# Patient Record
Sex: Female | Born: 1945
Health system: Southern US, Community
[De-identification: ages and names within clinical notes are randomized; demographics above are authoritative.]

## PROBLEM LIST (undated history)

## (undated) DIAGNOSIS — I25811 Atherosclerosis of native coronary artery of transplanted heart without angina pectoris: Secondary | ICD-10-CM

## (undated) DIAGNOSIS — F329 Major depressive disorder, single episode, unspecified: Secondary | ICD-10-CM

## (undated) DIAGNOSIS — S72141A Displaced intertrochanteric fracture of right femur, initial encounter for closed fracture: Secondary | ICD-10-CM

## (undated) DIAGNOSIS — K626 Ulcer of anus and rectum: Secondary | ICD-10-CM

## (undated) DIAGNOSIS — E119 Type 2 diabetes mellitus without complications: Secondary | ICD-10-CM

## (undated) DIAGNOSIS — E46 Unspecified protein-calorie malnutrition: Secondary | ICD-10-CM

## (undated) DIAGNOSIS — Z8719 Personal history of other diseases of the digestive system: Secondary | ICD-10-CM

## (undated) DIAGNOSIS — I639 Cerebral infarction, unspecified: Secondary | ICD-10-CM

## (undated) DIAGNOSIS — I10 Essential (primary) hypertension: Secondary | ICD-10-CM

## (undated) DIAGNOSIS — E785 Hyperlipidemia, unspecified: Secondary | ICD-10-CM

## (undated) DIAGNOSIS — D169 Benign neoplasm of bone and articular cartilage, unspecified: Secondary | ICD-10-CM

## (undated) DIAGNOSIS — Q211 Atrial septal defect: Secondary | ICD-10-CM

## (undated) DIAGNOSIS — F32A Depression, unspecified: Secondary | ICD-10-CM

## (undated) DIAGNOSIS — I509 Heart failure, unspecified: Secondary | ICD-10-CM

## (undated) DIAGNOSIS — D62 Acute posthemorrhagic anemia: Secondary | ICD-10-CM

## (undated) HISTORY — DX: Hyperlipidemia, unspecified: E78.5

## (undated) HISTORY — DX: Personal history of other diseases of the digestive system: Z87.19

## (undated) HISTORY — DX: Depression, unspecified: F32.A

## (undated) HISTORY — DX: Acute posthemorrhagic anemia: D62

## (undated) HISTORY — DX: Unspecified protein-calorie malnutrition: E46

## (undated) HISTORY — DX: Displaced intertrochanteric fracture of right femur, initial encounter for closed fracture: S72.141A

## (undated) HISTORY — DX: Atherosclerosis of native coronary artery of transplanted heart without angina pectoris: I25.811

## (undated) HISTORY — DX: Major depressive disorder, single episode, unspecified: F32.9

## (undated) HISTORY — PX: CARDIAC SURGERY: SHX584

---

## 1999-10-02 ENCOUNTER — Ambulatory Visit (HOSPITAL_BASED_OUTPATIENT_CLINIC_OR_DEPARTMENT_OTHER): Admission: RE | Admit: 1999-10-02 | Discharge: 1999-10-02 | Payer: Self-pay | Admitting: Otolaryngology

## 2002-02-26 ENCOUNTER — Emergency Department (HOSPITAL_COMMUNITY): Admission: EM | Admit: 2002-02-26 | Discharge: 2002-02-26 | Payer: Self-pay | Admitting: Emergency Medicine

## 2002-02-26 ENCOUNTER — Encounter: Payer: Self-pay | Admitting: Emergency Medicine

## 2002-08-14 ENCOUNTER — Emergency Department (HOSPITAL_COMMUNITY): Admission: EM | Admit: 2002-08-14 | Discharge: 2002-08-14 | Payer: Self-pay | Admitting: Emergency Medicine

## 2002-08-14 ENCOUNTER — Encounter: Payer: Self-pay | Admitting: Emergency Medicine

## 2002-08-21 ENCOUNTER — Encounter: Payer: Self-pay | Admitting: Cardiology

## 2002-08-21 ENCOUNTER — Encounter: Admission: RE | Admit: 2002-08-21 | Discharge: 2002-08-21 | Payer: Self-pay | Admitting: Cardiology

## 2002-09-07 ENCOUNTER — Inpatient Hospital Stay (HOSPITAL_COMMUNITY): Admission: EM | Admit: 2002-09-07 | Discharge: 2002-09-10 | Payer: Self-pay | Admitting: Emergency Medicine

## 2002-09-08 ENCOUNTER — Encounter: Payer: Self-pay | Admitting: Cardiology

## 2002-09-17 ENCOUNTER — Encounter: Payer: Self-pay | Admitting: Cardiology

## 2002-09-17 ENCOUNTER — Ambulatory Visit (HOSPITAL_COMMUNITY): Admission: RE | Admit: 2002-09-17 | Discharge: 2002-09-17 | Payer: Self-pay | Admitting: Cardiology

## 2002-10-11 ENCOUNTER — Emergency Department (HOSPITAL_COMMUNITY): Admission: AD | Admit: 2002-10-11 | Discharge: 2002-10-12 | Payer: Self-pay | Admitting: Emergency Medicine

## 2002-10-11 ENCOUNTER — Encounter: Payer: Self-pay | Admitting: Emergency Medicine

## 2003-01-26 ENCOUNTER — Encounter: Payer: Self-pay | Admitting: Emergency Medicine

## 2003-01-26 ENCOUNTER — Inpatient Hospital Stay (HOSPITAL_COMMUNITY): Admission: EM | Admit: 2003-01-26 | Discharge: 2003-01-29 | Payer: Self-pay | Admitting: Emergency Medicine

## 2003-01-28 ENCOUNTER — Encounter: Payer: Self-pay | Admitting: Cardiology

## 2003-01-31 ENCOUNTER — Emergency Department (HOSPITAL_COMMUNITY): Admission: EM | Admit: 2003-01-31 | Discharge: 2003-01-31 | Payer: Self-pay | Admitting: *Deleted

## 2003-09-30 ENCOUNTER — Emergency Department (HOSPITAL_COMMUNITY): Admission: EM | Admit: 2003-09-30 | Discharge: 2003-09-30 | Payer: Self-pay | Admitting: Family Medicine

## 2003-12-30 ENCOUNTER — Inpatient Hospital Stay (HOSPITAL_COMMUNITY): Admission: EM | Admit: 2003-12-30 | Discharge: 2004-01-02 | Payer: Self-pay | Admitting: Emergency Medicine

## 2004-04-01 ENCOUNTER — Emergency Department (HOSPITAL_COMMUNITY): Admission: EM | Admit: 2004-04-01 | Discharge: 2004-04-01 | Payer: Self-pay | Admitting: Emergency Medicine

## 2004-06-14 ENCOUNTER — Inpatient Hospital Stay (HOSPITAL_COMMUNITY): Admission: EM | Admit: 2004-06-14 | Discharge: 2004-06-16 | Payer: Self-pay | Admitting: Emergency Medicine

## 2004-12-18 ENCOUNTER — Emergency Department (HOSPITAL_COMMUNITY): Admission: EM | Admit: 2004-12-18 | Discharge: 2004-12-18 | Payer: Self-pay | Admitting: Emergency Medicine

## 2005-01-01 ENCOUNTER — Inpatient Hospital Stay (HOSPITAL_BASED_OUTPATIENT_CLINIC_OR_DEPARTMENT_OTHER): Admission: RE | Admit: 2005-01-01 | Discharge: 2005-01-01 | Payer: Self-pay | Admitting: Cardiology

## 2005-03-11 ENCOUNTER — Encounter: Admission: RE | Admit: 2005-03-11 | Discharge: 2005-03-11 | Payer: Self-pay | Admitting: Cardiology

## 2005-11-15 ENCOUNTER — Ambulatory Visit (HOSPITAL_COMMUNITY): Admission: RE | Admit: 2005-11-15 | Discharge: 2005-11-15 | Payer: Self-pay | Admitting: Cardiology

## 2006-02-06 ENCOUNTER — Inpatient Hospital Stay (HOSPITAL_COMMUNITY): Admission: EM | Admit: 2006-02-06 | Discharge: 2006-02-08 | Payer: Self-pay | Admitting: Emergency Medicine

## 2006-10-16 ENCOUNTER — Inpatient Hospital Stay (HOSPITAL_COMMUNITY): Admission: EM | Admit: 2006-10-16 | Discharge: 2006-10-19 | Payer: Self-pay | Admitting: Emergency Medicine

## 2007-05-27 ENCOUNTER — Emergency Department (HOSPITAL_COMMUNITY): Admission: EM | Admit: 2007-05-27 | Discharge: 2007-05-27 | Payer: Self-pay | Admitting: Emergency Medicine

## 2007-08-22 ENCOUNTER — Emergency Department (HOSPITAL_COMMUNITY): Admission: EM | Admit: 2007-08-22 | Discharge: 2007-08-22 | Payer: Self-pay | Admitting: Emergency Medicine

## 2007-12-30 ENCOUNTER — Emergency Department (HOSPITAL_COMMUNITY): Admission: EM | Admit: 2007-12-30 | Discharge: 2007-12-31 | Payer: Self-pay | Admitting: Emergency Medicine

## 2008-08-16 ENCOUNTER — Ambulatory Visit (HOSPITAL_COMMUNITY): Admission: RE | Admit: 2008-08-16 | Discharge: 2008-08-16 | Payer: Self-pay | Admitting: Orthopedic Surgery

## 2008-10-08 ENCOUNTER — Emergency Department (HOSPITAL_COMMUNITY): Admission: EM | Admit: 2008-10-08 | Discharge: 2008-10-08 | Payer: Self-pay | Admitting: Emergency Medicine

## 2009-07-05 DIAGNOSIS — D169 Benign neoplasm of bone and articular cartilage, unspecified: Secondary | ICD-10-CM

## 2009-07-05 HISTORY — DX: Benign neoplasm of bone and articular cartilage, unspecified: D16.9

## 2009-08-19 ENCOUNTER — Emergency Department (HOSPITAL_COMMUNITY): Admission: EM | Admit: 2009-08-19 | Discharge: 2009-08-19 | Payer: Self-pay | Admitting: Emergency Medicine

## 2009-10-10 ENCOUNTER — Emergency Department (HOSPITAL_COMMUNITY): Admission: EM | Admit: 2009-10-10 | Discharge: 2009-10-10 | Payer: Self-pay | Admitting: Emergency Medicine

## 2010-02-14 ENCOUNTER — Observation Stay (HOSPITAL_COMMUNITY): Admission: EM | Admit: 2010-02-14 | Discharge: 2010-02-18 | Payer: Self-pay | Admitting: Emergency Medicine

## 2010-02-16 ENCOUNTER — Encounter (INDEPENDENT_AMBULATORY_CARE_PROVIDER_SITE_OTHER): Payer: Self-pay | Admitting: Cardiology

## 2010-05-15 ENCOUNTER — Emergency Department (HOSPITAL_COMMUNITY): Admission: EM | Admit: 2010-05-15 | Discharge: 2010-05-15 | Payer: Self-pay | Admitting: Emergency Medicine

## 2010-09-16 LAB — POCT CARDIAC MARKERS
CKMB, poc: 1.8 ng/mL (ref 1.0–8.0)
CKMB, poc: 2 ng/mL (ref 1.0–8.0)
Myoglobin, poc: 95.2 ng/mL (ref 12–200)
Troponin i, poc: <0.05 ng/mL (ref 0.00–0.09)
Troponin i, poc: <0.05 ng/mL (ref 0.00–0.09)

## 2010-09-16 LAB — CBC
HCT: 42.6 % (ref 36.0–46.0)
MCH: 28.3 pg (ref 26.0–34.0)
MCV: 88.8 fL (ref 78.0–100.0)
Platelets: 213 10*3/uL (ref 150–400)
RBC: 4.8 MIL/uL (ref 3.87–5.11)
WBC: 8.7 10*3/uL (ref 4.0–10.5)

## 2010-09-16 LAB — BASIC METABOLIC PANEL
CO2: 26 mEq/L (ref 19–32)
Chloride: 110 mEq/L (ref 96–112)
Creatinine, Ser: 0.88 mg/dL (ref 0.4–1.2)
GFR calc Af Amer: >60 mL/min (ref >60)
Potassium: 4.1 mEq/L (ref 3.5–5.1)

## 2010-09-18 LAB — POCT CARDIAC MARKERS
CKMB, poc: 1.9 ng/mL (ref 1.0–8.0)
Myoglobin, poc: 137 ng/mL (ref 12–200)

## 2010-09-18 LAB — COMPREHENSIVE METABOLIC PANEL
ALT: 13 U/L (ref 0–35)
AST: 18 U/L (ref 0–37)
Albumin: 4 g/dL (ref 3.5–5.2)
Calcium: 9.5 mg/dL (ref 8.4–10.5)
Chloride: 107 mEq/L (ref 96–112)
Creatinine, Ser: 0.89 mg/dL (ref 0.4–1.2)
GFR calc Af Amer: >60 mL/min (ref >60)
Sodium: 144 mEq/L (ref 135–145)
Total Bilirubin: 0.7 mg/dL (ref 0.3–1.2)

## 2010-09-18 LAB — APTT: aPTT: 29 seconds (ref 24–37)

## 2010-09-18 LAB — CBC
HCT: 44.6 % (ref 36.0–46.0)
Hemoglobin: 13.6 g/dL (ref 12.0–15.0)
MCHC: 32.1 g/dL (ref 30.0–36.0)
MCHC: 30.4 g/dL (ref 30.0–36.0)
MCV: 87.7 fL (ref 78.0–100.0)
Platelets: 209 10*3/uL (ref 150–400)
Platelets: 183 10*3/uL (ref 150–400)
Platelets: 208 10*3/uL (ref 150–400)
RBC: 4.78 MIL/uL (ref 3.87–5.11)
RDW: 13.2 % (ref 11.5–15.5)
RDW: 13.8 % (ref 11.5–15.5)
WBC: 7.2 10*3/uL (ref 4.0–10.5)
WBC: 8.5 10*3/uL (ref 4.0–10.5)

## 2010-09-18 LAB — DIFFERENTIAL
Eosinophils Relative: 1 % (ref 0–5)
Lymphocytes Relative: 30 % (ref 12–46)
Lymphs Abs: 2.6 10*3/uL (ref 0.7–4.0)
Monocytes Absolute: 0.6 10*3/uL (ref 0.1–1.0)

## 2010-09-18 LAB — CARDIAC PANEL(CRET KIN+CKTOT+MB+TROPI)
CK, MB: 2.8 ng/mL (ref 0.3–4.0)
Relative Index: 1.1 (ref 0.0–2.5)
Total CK: 271 U/L — ABNORMAL HIGH (ref 7–177)
Troponin I: 0.03 ng/mL (ref 0.00–0.06)
Troponin I: <0.01 ng/mL (ref 0.00–0.06)

## 2010-09-18 LAB — CK TOTAL AND CKMB (NOT AT ARMC): Relative Index: 1.2 (ref 0.0–2.5)

## 2010-09-18 LAB — BASIC METABOLIC PANEL
BUN: 5 mg/dL — ABNORMAL LOW (ref 6–23)
Calcium: 9.7 mg/dL (ref 8.4–10.5)
Creatinine, Ser: 0.93 mg/dL (ref 0.4–1.2)
GFR calc non Af Amer: >60 mL/min (ref >60)
Glucose, Bld: 127 mg/dL — ABNORMAL HIGH (ref 70–99)

## 2010-09-18 LAB — PROTIME-INR: INR: 0.97 (ref 0.00–1.49)

## 2010-09-18 LAB — LIPID PANEL
Cholesterol: 211 mg/dL — ABNORMAL HIGH (ref 0–200)
HDL: 49 mg/dL (ref >39)

## 2010-09-18 LAB — HEMOGLOBIN A1C
Hgb A1c MFr Bld: 6.6 % — ABNORMAL HIGH (ref <5.7)
Mean Plasma Glucose: 143 mg/dL — ABNORMAL HIGH (ref <117)

## 2010-09-18 LAB — BRAIN NATRIURETIC PEPTIDE: Pro B Natriuretic peptide (BNP): <30 pg/mL (ref 0.0–100.0)

## 2010-11-17 ENCOUNTER — Emergency Department (HOSPITAL_COMMUNITY)
Admission: EM | Admit: 2010-11-17 | Discharge: 2010-11-17 | Disposition: A | Discharge status: Home / Self Care | Payer: BC Managed Care – PPO | Attending: Emergency Medicine | Admitting: Emergency Medicine

## 2010-11-17 DIAGNOSIS — S60949A Unspecified superficial injury of unspecified finger, initial encounter: Secondary | ICD-10-CM | POA: Insufficient documentation

## 2010-11-17 DIAGNOSIS — M79609 Pain in unspecified limb: Secondary | ICD-10-CM | POA: Insufficient documentation

## 2010-11-17 DIAGNOSIS — I10 Essential (primary) hypertension: Secondary | ICD-10-CM | POA: Insufficient documentation

## 2010-11-17 DIAGNOSIS — W4909XA Other specified item causing external constriction, initial encounter: Secondary | ICD-10-CM | POA: Insufficient documentation

## 2010-11-17 DIAGNOSIS — E7800 Pure hypercholesterolemia, unspecified: Secondary | ICD-10-CM | POA: Insufficient documentation

## 2010-11-20 NOTE — Discharge Summary (Signed)
[name], [name]               ACCOUNT NO.:  [id number]   MEDICAL RECORD NO.:  [id number]          PATIENT TYPE:  INP   LOCATION:  4705                         FACILITY:  [facility]   PHYSICIAN:  Osvaldo Shipper. Spruill, M.D.DATE OF BIRTH:  [birth year]   DATE OF ADMISSION:  10/16/2006  DATE OF DISCHARGE:  10/19/2006                               DISCHARGE SUMMARY   DISCHARGE DIAGNOSES:  1. Chest pain.  2. Hypertension.  3. Hypercholesterolemia.  4. Diabetes mellitus type 2.  5. Esophageal reflux.  6. Anxiety.   HISTORY OF PRESENT ILLNESS:  Admission history and physical was done by  Dr. Sharyn Lull.  The patient was found to be a 65 year old black female  with a past medical history of hypertension, non-insulin dependent  diabetes mellitus, gastroesophageal reflux disease.  The patient  presented to the hospital because of chest pain that radiated to the  arm, into the right side of the chest.  It was described as being chest  tightness.  It was associated with eating, lasting approximately 1  minute.  The patient did report that she gets sweaty with these episodes  and had some mild shortness of breath and a dizzy episode.   Examination was essentially within normal limits.  Cardiac markers were  negative for acute changes.  The electrocardiogram showed normal sinus  rhythm with nonspecific ST-T wave changes but no significant change in  electrocardiogram was noted.  The patient was admitted for atypical  chest pain, hypertension and non-insulin dependent diabetes mellitus.   The patient was admitted to the medical service, was placed on  telemetry.  Insulin coverage was ordered for the patient.  Plans were  made for a stress myoglobin test.  There was no stress-induced ischemia.  The ejection fraction was 65%.  There was fixed thinning at the lateral  apex and inferior walls.  At this point the IV nitroglycerin and IV and  heparin were discontinued.  On April 16 it was noted the patient  was  stable.  The Myoview scan was negative and plans were made for discharge  home per Dr. Shana Chute.   MEDICATIONS AT DISCHARGE:  Include Lopressor 12.5 mg twice a day,  aspirin 325 mg daily, Protonix 40 mg daily, Lipitor 40 mg each morning  and metformin 500 mg daily.   FOLLOW UP:  The patient is to return to the office for completion of  outpatient workup of her chest discomfort.  She is to notify the  physician immediately if any changes, problems or concerns.      [name], P.A.      Osvaldo Shipper. Spruill, M.D.  Electronically Signed    HB/MEDQ  D:  12/07/2006  T:  12/08/2006  Job:  [id number]

## 2010-11-20 NOTE — Discharge Summary (Signed)
NAME:  [name], [name]                         ACCOUNT NO.:  [id number]   MEDICAL RECORD NO.:  [id number]                   PATIENT TYPE:  INP   LOCATION:  4736                                 FACILITY:  [facility]   PHYSICIAN:  Osvaldo Shipper. Spruill, M.D.             DATE OF BIRTH:  04-Apr-1946   DATE OF ADMISSION:  09/07/2002  DATE OF DISCHARGE:  09/10/2002                                 DISCHARGE SUMMARY   DISCHARGE DIAGNOSES:  1. Tietze's disease.  2. Hypovolemia.  3. Acute bronchitis.  4. Anxiety.  5. Orthostatic hypotension.  6. Dizziness.   HISTORY:  This is a 65 year old patient who presented initially to the  emergency department of [facility] with a chief complaint of  grogginess with some slurred speech.   According to the patient's family, the patient took Vicodin and Xanax during  the morning prior to admission, and the family noted that the patient was  groggy and had some slurred speech and wanted to know why this was  occurring.  It is of note that the patient has a history of enlarged heart  and also a hiatal hernia.  The patient was evaluated in the emergency  department, noted to be disoriented, had a bradycardia of 58, and there were  electrocardiogram changes with ST-T-wave abnormalities, consistent with  inferior ischemia.  After further questioning, the patient did admit to a  long-term history of substernal chest discomfort.  The patient was  subsequently admitted for evaluation of the chest pain and for evaluation of  the adverse reaction to her medications.   HOSPITAL COURSE:  The patient was admitted to the medical service.  Patient  was seen on consultation by pharmacy for heparin protocol.   On 09/08/2002, after further testing and evaluation, it was noted that the  patient had pneumonia.  Also was found some hypokalemia.  Plans were then  made for a spiral CT to rule out the possibility of pulmonary embolus as the  patient had an abnormal  pattern in her chest x-ray findings, and also the  patient has a history of pulmonary embolus from the past.   The patient underwent CT scan of the chest, and there was noted no evidence  of pulmonary emboli; there was noted some mild cardiomegaly.   The patient, on 09/09/2002, began to feel some better after pulmonary toilet  and IV Rocephin; however, on 03/07, the patient was noted to have some  orthostatic hypotension, and this was addressed.  Patient's IV antibiotics  were then changed to Levaquin on 09/10/2002, and after evaluation of all of  her tests and reevaluation of the patient clinically, it was the opinion  that the patient probably had a costochondritis that caused her chest  discomfort and that she was most likely somewhat dehydrated due to some  orthostatic blood pressure changes.  It was the opinion, however, that the  patient could be  discharged home and additional workup continued as an  outpatient.   DISCHARGE MEDICATIONS:  1. Include Vioxx 25 mg daily.  2. Ativan 1 mg three times per day.  3. Tequin 400 mg daily.  4. Restoril 30 mg at bedtime.   DIET:  Patient was placed on a low-sodium-restricted diet.   DISCHARGE INSTRUCTIONS:  Patient is to have an outpatient Cardiolite study  in one week, to increase fluids, and to return to the office in two weeks  unless symptoms change or problems persist.     [name], P.A.                       Osvaldo Shipper. Spruill, M.D.    HB/MEDQ  D:  11/14/2002  T:  11/15/2002  Job:  [id number]

## 2010-11-20 NOTE — Discharge Summary (Signed)
   NAME:  [name], [name]                         ACCOUNT NO.:  [id number]   MEDICAL RECORD NO.:  [id number]                   PATIENT TYPE:  INP   LOCATION:  3702                                 FACILITY:  [facility]   PHYSICIAN:  Osvaldo Shipper. Spruill, M.D.             DATE OF BIRTH:  Nov 27, 1945   DATE OF ADMISSION:  01/26/2003  DATE OF DISCHARGE:  01/29/2003                                 DISCHARGE SUMMARY   DISCHARGE DIAGNOSES:  1. Precordial pain.  2. Congestive heart failure.  3. Coronary artery disease.  4. Respiratory abnormality.  5. Hypokalemia.  6. Cardiac dysrhythmia.  7. Anxiety.  8. Menopausal disorder.   HISTORY OF PRESENT ILLNESS:  This is a 65 year old patient who presented  initially to the emergency department of the [facility].  The  patient was evaluated with a two-day history of chest pain that seems to  come and go.  It was attended with some shortness of breath and aggravated  with light activity.  The patient was examined and evaluated, found to have  an electrocardiogram revealing normal sinus rhythm at 79 beats per minute.  The chest x-ray revealed congestive heart failure.  The patient was  subsequently admitted for evaluation of this particular problem.   HOSPITAL COURSE:  The patient was admitted to the medical service and placed  on telemetry.  She was seen by pharmacy consultation for Lovenox protocol.  The patient underwent serial electrocardiogram and cardiac enzymes  evaluation.  These were negative for myocardial infarction.  The patient on  July 26 underwent Persantine Cardiolite study which revealed a calculated  left ventricular ejection fraction of 65%.  It was noted to be a normal  examination without evidence of induced myocardial ischemia.  After cardiac  evaluation and improvement of the patient's chest pain symptoms on current  regimen of therapy, it was the opinion that the patient had received maximum  benefit from this  hospitalization and could be discharged home.   DISCHARGE MEDICATIONS:  1. Lopressor 25 mg daily.  2. Vicodin ES 1 every 4 hours as needed for pain.  3. Motrin 800 mg 3 times daily.  4. Ativan 1 mg 3 times daily.  5. Premarin 0.625 mg daily.   DIET:  The patient was placed on a low-sodium diet.    FOLLOW UP:  She will be seen in the office in one week.  She is to notify  the physician immediately of any changes, problems, or concerns.      [name], P.A.                       Osvaldo Shipper. Spruill, M.D.    HB/MEDQ  D:  02/14/2003  T:  02/15/2003  Job:  [id number]

## 2010-11-20 NOTE — Discharge Summary (Signed)
NAME:  [name], [name]                         ACCOUNT NO.:  [id number]   MEDICAL RECORD NO.:  [id number]                   PATIENT TYPE:  INP   LOCATION:  2003                                 FACILITY:  [facility]   PHYSICIAN:  Osvaldo Shipper. Spruill, M.D.             DATE OF BIRTH:  Aug 31, 1945   DATE OF ADMISSION:  12/30/2003  DATE OF DISCHARGE:  01/02/2004                                 DISCHARGE SUMMARY   DISCHARGE DIAGNOSES:  1. Chest pain.  2. Hypertension.  3. Tobacco disorder.  4. Obesity.  5. Headache.  6. Neurotic depression.  7. Hypopotassemia.   HISTORY OF PRESENT ILLNESS:  [name] is a 65 year old patient who  presented initially to the emergency department of the [facility]  at which time she complained of chest pain, problems breathing and a  tingling sensation in her arm.  She described a tightness in her chest that  started on the morning of admission with left arm tingling associated with  nausea, shortness of breath and sweating.  It did not seem to be aggravated  by anything in particular, and neither was it relieved by anything in  particular.  She was evaluated and noted to have a tachycardia present  initially.  This gradually improved.  She had serial cardiac markers on  which the myoglobin was slightly elevated.  However, the CK MB and troponin  were within normal limits.  The case was discussed, and the patient was  admitted for further evaluation of this chest pain and discomfort,  particularly since the patient is a tobacco user, has hypertension, is obese  and has had some problems with congestive heart failure in the past.   HOSPITAL COURSE:  The patient was admitted to the Medical Service, was  placed on telemetry, was seen by pharmacy for heparin consultation.  The  patient continued to complain of pain as much as 6-7 on a 1/10 scale.  She  was treated appropriately.  She had EKG's done and they revealed some  inverted T waves in lead V3,  AVF, V1 and V2.   On June 28, the patient was scheduled for a Persantine Cardiolite study.  There was no evidence on this study of myocardial ischemia or infarction,  and there was normal wall motion.  The ejection fraction was measured at  63%.  At this point, the IV nitroglycerin was discontinued.  The patient had  some smoking issues and smoking cessation consultation was obtained.  The  patient then had a CT scan of the chest.  However, this was read as  negative.  Serial cardiac enzymes were also negative, and there were no  acute electrocardiogram changes during the visit.  It was, therefore, the  opinion that the patient could be discharged home, having received maximum  benefit from this hospitalization and complete her workup of this chest pain  as an outpatient.   DISCHARGE MEDICATIONS:  1. Lopressor 25 mg one b.i.d.  2. Ativan 1 t.i.d.  3. Vicodin 1 q.4-6h.  4. Protonix 40 mg daily.   DISCHARGE INSTRUCTIONS:  Diet:  Low sodium.  She is to be seen in the office  on January 07, 2004, sooner if any changes, problems or concerns.      [name], P.A.                       Osvaldo Shipper. Spruill, M.D.    HB/MEDQ  D:  02/13/2004  T:  02/14/2004  Job:  [id number]

## 2010-11-20 NOTE — Discharge Summary (Signed)
[name], [name]               ACCOUNT NO.:  [id number]   MEDICAL RECORD NO.:  [id number]          PATIENT TYPE:  INP   LOCATION:  3737                         FACILITY:  [facility]   PHYSICIAN:  Mohan N. Sharyn Lull, M.D. DATE OF BIRTH:  12-Nov-1945   DATE OF ADMISSION:  06/14/2004  DATE OF DISCHARGE:  06/16/2004                                 DISCHARGE SUMMARY   ADMITTING DIAGNOSES:  1.  Recurrent chest pain, left arm pain, rule out myocardial infarction.  2.  Elevated blood sugar, rule out diabetes mellitus.  3.  Hypertension.  4.  Tobacco abuse.  5.  Positive family history of coronary artery disease.  6.  Questionable history of congestive heart failure.   DISCHARGE DIAGNOSES:  1.  Status post recurrent chest pain, myocardial infarction ruled out.  2.  Status post left arm pain, rule out cervical radiculopathy.  3.  Non-insulin-dependent new onset diabetes mellitus controlled by diet.  4.  Hypertension.  5.  Tobacco abuse.  6.  Positive family history of coronary artery disease.  7.  Status post hypokalemia.   DISCHARGE MEDICATIONS:  1.  Toprol XL 25 mg one tablet daily.  2.  Baby aspirin 81 mg one tablet daily.  3.  Protonix 40 mg one tablet b.i.d. before meals.  4.  Ambien 10 mg one tablet daily at night as needed.  5.  Nitrostat 0.4 mg sublingual, use as directed.   ACTIVITY:  As tolerated.   DIET:  Low salt, low cholesterol 1800 calorie ADA diet.  Patient has been  advised to avoid sweets.   Follow up with Dr. Shana Chute in two weeks.   CONDITION ON DISCHARGE:  Stable.   BRIEF HISTORY:  Ms. Cuthrell is a 65 year old black female with past medical  history significant for hypertension, questionable history of congestive  heart failure, tobacco abuse, positive family history of coronary artery  disease.  She came to the ER complaining of recurrent left-sided chest pain  radiating to the left arm.  Chest pain described as tightness grade 6/10  associated with mild  shortness of breath.  Denies relation of chest pain to  food, breathing, or movement.  Denies any neck trauma.  States she has been  having chest pain off and on for last two weeks.  Today chest pain started  around 8:30 a.m. while cooking and again started 11:30 a.m. lasting few  minutes so decided to come to ER.  Denies any nausea, vomiting, diaphoresis.  Denies palpitations, lightheadedness, or syncope.  Denies PND, orthopnea,  leg swelling.  Patient does give history of exertional dyspnea.  Denies any  cough, fevers, chills.   PAST MEDICAL HISTORY:  As above.   PAST SURGICAL HISTORY:  1.  She had right elbow surgery seven to eight years ago.  2.  Had hysterectomy in the past.  3.  Had tonsillectomy at young age.   ALLERGIES:  No known drug allergies.   MEDICATIONS:  She states she has been taking three different medications,  but does not remember the name.   SOCIAL HISTORY:  She is married, has six children.  Retired housewife.  Smoked half pack per day for 10+ years.  Negative alcohol abuse.   FAMILY HISTORY:  Father died of massive MI at the age of 56.  Mother is  alive.  She is 73.  She has two brothers and two sisters.  One sister has  questionable heart problems.   PHYSICAL EXAMINATION:  GENERAL:  She was alert, awake, oriented x3.  No  acute distress.  VITAL SIGNS:  Blood pressure 140/91, pulse 60, regular, afebrile.  HEENT:  Conjunctiva was pink.  NECK:  Supple.  No JVD.  No bruit.  LUNGS:  Clear to auscultation without rhonchi or rales.  CARDIOVASCULAR:  S1, S2 was normal.  There was no S3 gallop or S4 gallop or  murmur.  ABDOMEN:  Soft.  Bowel sounds are present, nontender.  There was no mass or  organomegaly.  EXTREMITIES:  There was no clubbing, cyanosis, edema.   LABORATORIES:  Three sets of cardiac enzymes point of care in ER were  negative.  CK-MB was 1.6, 1.9, and 2.0.  Troponin I three sets were less  than 0.05.  C reactive protein was 0.1.  Her three  sets of cardiac enzymes  were also negative.  CK was 376, MB 3.1, relative index 0.8; second set CK  336, MB 2.4, relative index 0.7; third set CK 328, MB 2.4, relative index  0.7.  Troponin I three sets were less than 0.01.  Her cholesterol is still  pending.  Her sodium was 140, potassium 3.6, chloride 105, blood sugar 131,  fasting blood sugar 126, BUN 9, creatinine 1.1.  Hemoglobin 13.6, hematocrit  39.8, white count 9.9.  Her EKG showed normal sinus rhythm with T-wave  inversion in anteroseptal leads and minor ST T-wave changes.   HOSPITAL COURSE:  Patient was admitted to telemetry unit.  MI was ruled out  by serial enzymes and EKG.  Patient subsequently underwent Persantine  Myoview which showed no evidence of reversible ischemia or infarct with EF  of 68%.  Patient did not have any further episodes of chest pain during the  hospital stay.  Patient's potassium was replaced.  Her blood sugar has been  running between 100-140.  A nutrition consult was obtained.  Patient was advised per dietary guidelines.  Patient will be discharged home  on the above medications and will be followed up by Dr. Shana Chute in two  weeks.  Her lipid panel is still pending.  Dr. Shana Chute will review her lipid  panel and accordingly treat as outpatient if needed.       MNH/MEDQ  D:  06/16/2004  T:  06/16/2004  Job:  [id number]   cc:   Osvaldo Shipper. Spruill, M.D.  [address] [id number]  [place]  [place] [address]  Fax: [phone]

## 2010-11-20 NOTE — Cardiovascular Report (Signed)
[name], LIPPMAN               ACCOUNT NO.:  [id number]   MEDICAL RECORD NO.:  [id number]          PATIENT TYPE:  OIB   LOCATION:  6501                         FACILITY:  [facility]   PHYSICIAN:  Mohan N. Sharyn Lull, M.D. DATE OF BIRTH:  Jun 10, 1946   DATE OF PROCEDURE:  01/01/2005  DATE OF DISCHARGE:  01/01/2005                              CARDIAC CATHETERIZATION   PROCEDURE:  1.  Left cardiac catheterization with selective left and right coronary      angiography.  2.  Left ventriculography via right groin using Judkins technique.   INDICATIONS FOR PROCEDURE:  [name] is a 65 year old black female with  past medical history significant for hypertension, non-insulin-dependent  diabetes mellitus controlled by diet, GERD, history of tobacco abuse,  positive family history of coronary artery disease, history of congestive  heart failure.  Complains of recurrent retrosternal chest tightness grade  8/10 radiating to the left arm and left side of neck associated with mild  diaphoresis and shortness of breath which relieves with sublingual  nitroglycerin.  Patient had Persantine Myoview in the past which showed no  evidence of reversible ischemia but due to typical recurrent chest pain she  is referred for left catheterization.  Patient denies any rest angina, but  complains of exertional dyspnea.  Denies palpitation, lightheadedness.  Denies PND, orthopnea, leg swelling.  Denies history of syncope.   PAST MEDICAL HISTORY:  As above.   PAST SURGICAL HISTORY:  She had right elbow surgery seven to eight years  ago.  Had hysterectomy in the past.  Had tonsillectomy in the past.   ALLERGIES:  No known drug allergies.   MEDICATIONS AT HOME:  1.  She is on Toprol XL 25 mg p.o. daily.  2.  Protonix 40 mg p.o. daily.  3.  Baby aspirin 81 mg p.o. daily.  4.  Sublingual nitroglycerin p.r.n..   SOCIAL HISTORY:  She is married, has six children.  Retired housewife.  Smokes a half pack per  day for 10 years.  Quit one and a half years ago.  No  history of alcohol abuse.   FAMILY HISTORY:  Father died of MI in his 26s.  Mother died at the age of  36.  She has two brothers and two sisters.  One sister has heart problems.  One sister died of CA of breast.   PHYSICAL EXAMINATION:  GENERAL:  She ws alert, awake, oriented x3, in no  acute distress.  VITAL SIGNS:  Blood pressure was 140/80, pulse was 72, regular.  HEENT:  Conjunctiva was pink.  NECK:  Supple.  No JVD, no bruit.  LUNGS:  Clear to auscultation without rhonchi or rales.  CARDIOVASCULAR:  S1, S2 was normal.  There was soft systolic murmur at left  lower sternal border.  There was no S3 gallop.  ABDOMEN:  Soft.  Bowel sounds present.  Nontender.  EXTREMITIES:  There was no clubbing, cyanosis, or edema.   IMPRESSION:  1.  Recurrent chest pain, rule out coronary insufficiency.  2.  Hypertension.  3.  Non-insulin-dependent diabetes mellitus controlled by diet.  4.  History  of tobacco abuse.  5.  Positive family history of coronary artery disease.   Discussed with patient regarding left catheterization, its risks and  benefits, i.e., death, MI, stroke, need for emergency CABG, local vascular  complications, etc. and consented for the procedure.   PROCEDURE:  After obtaining the informed consent patient was brought to the  catheterization laboratory and was placed on fluoroscopy table.  Right groin  was prepped and draped in usual fashion.  2% Xylocaine was used for local  anesthesia in the right groin.  With the help of thin wall needle 6-French  arterial sheath was placed.  The sheath was aspirated and flushed.  Next, 4-  French arterial sheath was placed.  Next, 4-French left Judkins catheter was  advanced over the wire under fluoroscopic guidance up to the ascending  aorta.  Wire was pulled out.  The catheter was aspirated and connected to  the manifold.  Catheter was further advanced and engaged into left  coronary  ostium.  Multiple views of the left system were taken.  Next, the catheter  was disengaged and was pulled out over the wire and was replaced with 4-  [place] right Judkins catheter which was advanced over the wire under  fluoroscopic guidance up to the ascending aorta.  Wire was pulled out.  The  catheter was aspirated and connected to the manifold.  Catheter was further  advanced and engaged into right coronary ostium.  Multiple views of the  right system were taken.  Next, catheter was disengaged and was pulled out  over the wire and was replaced with 4-French pigtail catheter which was  advanced over the wire under fluoroscopic guidance up to the ascending  aorta.  Wire was pulled out.  The catheter was aspirated and connected to  the manifold.  Catheter was further advanced across the aortic valve into  the LV.  LV pressures were recorded.  Next, LV graphy was done in 30 degree  RAO position.  Post angiographic pressures were recorded from LV and then  pullback pressures were recorded from aorta.  There was no gradient across  the aortic valve.  Next, pigtail catheter was pulled out over the wire.  Sheaths were aspirated and flushed.   FINDINGS:  LV showed good LV systolic function.  Left main was patent.  LAD  has 10-20% ostial stenosis.  Diagonal 1 was small which was patent.  Ramus  was moderate size which was patent.  Left circumflex was patent.  High OM1  was large which was patent.  OM2 and OM3 were small which were patent.  RCA  has 15-20% proximal stenosis.  Patient tolerated the procedure well.  There  were no complications.  Patient was transferred to recovery room in stable  condition.           ______________________________  Eduardo Osier Sharyn Lull, M.D.     MNH/MEDQ  D:  04/01/2005  T:  04/01/2005  Job:  [id number]   cc:   Osvaldo Shipper. Spruill, M.D.  Fax: [phone]   Cath Lab

## 2010-11-20 NOTE — Op Note (Signed)
[place]. [facility]  Patient:    [name], [name]                        MRN: [id number] Proc. Date: 10/02/99 Attending:  Kristine Garbe. Ezzard Standing, M.D.                           Operative Report  PREOPERATIVE DIAGNOSIS:  Bilateral split or lacerated ear lobes.  POSTOPERATIVE DIAGNOSIS:  Bilateral split or lacerated ear lobes.  OPERATION:  Repair of bilateral split ear lobes (1.5 cm laceration bilaterally).  SURGEON:  Kristine Garbe. Ezzard Standing, M.D.  ANESTHESIA:  Local Xylocaine with epinephrine.  COMPLICATIONS:  None.  BRIEF CLINICAL NOTE:  [name] is a 65 year old female, who has had a longstanding split right ear lobe.  Just recently in the last couple of weeks, he split the left ear lobe with heavy earrings.  She is taken to the operating room at this time for repair of split ear lobes under local anesthetic.  DESCRIPTION OF PROCEDURE:  Ear lobes were injected with 2 cc of Xylocaine with epinephrine bilaterally.  Ear lobes were then prepped with Betadine solution and draped out with sterile towels.  First on the right side, the previous split region of epidermis and scar tissue was excised.  There was a fair amount of scar tissue around the split ear lobe.  The ends of the ear lobe were then reapproximated with 6-0 nylon suture and the ear lobes were repaired on either side with running 6-0 nylon suture.  The procedure was repeated on the left side.  Again, the split ear lobe, epidermis and scar tissue was excised in a V fashion and then repaired with a 6-0 nylon to reapproximate the epidermis.  Ear lobes were then cleaned and bacitracin ointment was applied.  Lynann tolerated the procedure well, subsequently discharged home.  DISPOSITION:  [name] was discharged home.  Instructed to apply antibiotic ointment to the ear lobes b.i.d. for the next four days.  Will have her follow up in my office in 10 days to have the sutures removed. DD:   10/02/99 TD:  10/03/99 Job: [id number]

## 2010-11-20 NOTE — Discharge Summary (Signed)
NAMENOCOLE, ZAMMIT               ACCOUNT NO.:  [id number]   MEDICAL RECORD NO.:  [id number]          PATIENT TYPE:  INP   LOCATION:  3715                         FACILITY:  [facility]   PHYSICIAN:  Mohan N. Sharyn Lull, M.D. DATE OF BIRTH:  11-02-1945   DATE OF ADMISSION:  02/05/2006  DATE OF DISCHARGE:  02/08/2006                                 DISCHARGE SUMMARY   ADMITTING DIAGNOSES:  1.  Chest pain with minor EKG changes, rule out coronary insufficiency.  2.  Hypertension.  3.  Non-insulin-dependent diabetes mellitus controlled by diet.  4.  Gastroesophageal reflux disease.  5.  Tobacco abuse.  6.  Positive family history of coronary artery disease.  7.  Bronchitis.   FINAL DIAGNOSIS:  1.  Chest pain with minor EKG changes, rule out coronary insufficiency.  2.  Hypertension.  3.  Non-insulin-dependent diabetes mellitus controlled by diet.  4.  Gastroesophageal reflux disease.  5.  Tobacco abuse.  6.  Positive family history of coronary artery disease.  7.  Bronchitis.   DISCHARGE MEDICATIONS:  1.  Altace 2.5 mg one capsule daily.  2.  Metformin 500 mg one tablet daily with supper.  3.  Protonix 40 mg one tablet daily.  4.  Baby aspirin 81 mg one tablet daily.  5.  Nitrostat 0.4 mg sublingual use as directed.  6.  Zithromax 250 mg two tablets today and then one tablet daily for four      more days.   Patient has been advised to monitor blood sugar daily regularly and chart.  Diet:  Low salt, low cholesterol 1800 calories ADA diet.  Patient has been  advised to avoid sweets.  Follow up with Dr. Shana Chute in two weeks.  Condition at discharge is stable.   BRIEF HISTORY AND HOSPITAL COURSE:  Ms. Needle is 65 year old black female  with past medical history significant for hypertension, non-insulin-  dependent diabetes mellitus controlled by diet, GERD, history of tobacco  abuse, positive family history of coronary artery disease, history of  congestive heart failure.  She came  to the ER complaining of retrosternal  chest pain radiating to both shoulders associated with diaphoresis.  She  took three sublingual nitro with partial relief, so decided to come to ER.  Patient denies any nausea, vomiting, denies palpitation, lightheadedness, or  syncope.  EKG done in ER showed normal sinus rhythm with mild ST depression  in anteroseptal leads which were new as compared to prior EKG.  Patient  denies any PND, orthopnea, leg swelling.  Patient does give history of  exertional dyspnea.  Denies any chest pain at present.   PAST MEDICAL HISTORY:  As above.   PAST SURGICAL HISTORY:  1.  She had hysterectomy in the past.  2.  Tonsillectomy in the past.  3.  Right elbow surgery in the past.   MEDICATION AT HOME:  1.  She was on Toprol XL 25 mg daily which she stopped recently.  2.  Enteric-coated aspirin 81 mg p.o. daily.  3.  Protonix 40 mg p.o. daily.  4.  Hydrocodone for pain as needed.  5.  Nitrostat 0.4 mg sublingual p.r.n.   SOCIAL HISTORY:  She is married, has six children.  Smoked half pack per day  for 10+ years.  No history of alcohol abuse.   FAMILY HISTORY:  Father died of massive MI at the age of 39.  Mother died of  colon cancer.  One sister had MI.   PHYSICAL EXAMINATION:  GENERAL:  She was alert, awake, oriented x3.  No  acute distress.  VITAL SIGNS:  Blood pressure was 94/56, pulse was 72, regular.  HEENT:  Conjunctiva was pink.  NECK:  Supple, no JVD, no bruit.  LUNGS:  Clear to auscultation without rhonchi or rales.  CARDIOVASCULAR:  S1, S2 was normal.  There was soft systolic murmur.  There  was no S3 gallop.  ABDOMEN:  Soft.  Bowel sounds were present, nontender.  EXTREMITIES:  There is no clubbing, cyanosis, or edema.   Chest x-ray showed cardiomegaly without congestive heart failure.  Her point  of care CPK-MB was 2.1, troponin I was less than 0.05.  Her total  cholesterol was 169, HDL was 39, LDL was 100, triglyceride 621.  Her two  sets  of cardiac enzymes by the laboratory CK was 546, MB 4, second set CK  456, MB 2.8.  Troponin I was 0.01 and 0.01.  Her sodium was 138, potassium  3.8, chloride 108, bicarbonate 26. Her blood sugar was 247.  Repeat fasting  blood sugar was 126.  BUN was 9, creatinine 1.1.  Hemoglobin A1c is still  pending.  Hemoglobin was 12.3, hematocrit 35.8, white count of 10.3,   BRIEF HOSPITAL COURSE:  Patient was admitted to telemetry unit.  MI was  ruled out by serial enzymes and EKG.  Patient subsequently underwent  Persantine Myoview which showed no evidence of reversible ischemia with EF  of 68%.  Patient had episode of sinus bradycardia secondary to beta blockers  which were held.  Diabetic nutrition consult was called as patient's blood  sugar is staying between 150-200 range.  Patient was started on metformin  yesterday and advised to follow her blood sugar daily at home.  Patient will  be discharged home on above medications and will be followed up by Dr.  Shana Chute in two weeks.           ______________________________  Eduardo Osier Sharyn Lull, M.D.    MNH/MEDQ  D:  02/08/2006  T:  02/08/2006  Job:  [id number]   cc:   Osvaldo Shipper. Spruill, M.D.

## 2010-12-22 ENCOUNTER — Emergency Department (HOSPITAL_COMMUNITY): Payer: BC Managed Care – PPO

## 2010-12-22 ENCOUNTER — Observation Stay (HOSPITAL_COMMUNITY)
Admission: EM | Admit: 2010-12-22 | Discharge: 2010-12-24 | DRG: 143 | Disposition: A | Discharge status: Home / Self Care | Payer: BC Managed Care – PPO | Source: Ambulatory Visit | Attending: Cardiology | Admitting: Cardiology

## 2010-12-22 DIAGNOSIS — Z7982 Long term (current) use of aspirin: Secondary | ICD-10-CM

## 2010-12-22 DIAGNOSIS — I10 Essential (primary) hypertension: Secondary | ICD-10-CM | POA: Diagnosis present

## 2010-12-22 DIAGNOSIS — E119 Type 2 diabetes mellitus without complications: Secondary | ICD-10-CM | POA: Diagnosis present

## 2010-12-22 DIAGNOSIS — E7800 Pure hypercholesterolemia, unspecified: Secondary | ICD-10-CM | POA: Diagnosis present

## 2010-12-22 DIAGNOSIS — Z8249 Family history of ischemic heart disease and other diseases of the circulatory system: Secondary | ICD-10-CM

## 2010-12-22 DIAGNOSIS — K219 Gastro-esophageal reflux disease without esophagitis: Secondary | ICD-10-CM | POA: Diagnosis present

## 2010-12-22 DIAGNOSIS — Z87891 Personal history of nicotine dependence: Secondary | ICD-10-CM

## 2010-12-22 DIAGNOSIS — I2510 Atherosclerotic heart disease of native coronary artery without angina pectoris: Secondary | ICD-10-CM | POA: Diagnosis present

## 2010-12-22 DIAGNOSIS — R0789 Other chest pain: Principal | ICD-10-CM | POA: Diagnosis present

## 2010-12-22 LAB — APTT: aPTT: 29 seconds (ref 24–37)

## 2010-12-22 LAB — CARDIAC PANEL(CRET KIN+CKTOT+MB+TROPI)
CK, MB: 3 ng/mL (ref 0.3–4.0)
Relative Index: 1.1 (ref 0.0–2.5)
Total CK: 270 U/L — ABNORMAL HIGH (ref 7–177)

## 2010-12-22 LAB — BASIC METABOLIC PANEL
BUN: 10 mg/dL (ref 6–23)
GFR calc Af Amer: >60 mL/min (ref >60)
GFR calc non Af Amer: >60 mL/min (ref >60)
Potassium: 3.3 mEq/L — ABNORMAL LOW (ref 3.5–5.1)

## 2010-12-22 LAB — TROPONIN I: Troponin I: <0.3 ng/mL (ref <0.30)

## 2010-12-22 LAB — CK TOTAL AND CKMB (NOT AT ARMC)
CK, MB: 3.4 ng/mL (ref 0.3–4.0)
Relative Index: 1.1 (ref 0.0–2.5)

## 2010-12-22 LAB — CBC
MCH: 28.2 pg (ref 26.0–34.0)
RDW: 13.2 % (ref 11.5–15.5)

## 2010-12-22 LAB — DIFFERENTIAL
Basophils Relative: 0 % (ref 0–1)
Eosinophils Absolute: 0.1 10*3/uL (ref 0.0–0.7)
Monocytes Absolute: 0.4 10*3/uL (ref 0.1–1.0)
Monocytes Relative: 5 % (ref 3–12)

## 2010-12-22 LAB — PROTIME-INR: Prothrombin Time: 13.2 seconds (ref 11.6–15.2)

## 2010-12-23 ENCOUNTER — Observation Stay (HOSPITAL_COMMUNITY): Payer: BC Managed Care – PPO

## 2010-12-23 LAB — CBC
HCT: 39.3 % (ref 36.0–46.0)
Hemoglobin: 12.9 g/dL (ref 12.0–15.0)
MCH: 28.4 pg (ref 26.0–34.0)
MCV: 86.6 fL (ref 78.0–100.0)
RBC: 4.54 MIL/uL (ref 3.87–5.11)

## 2010-12-23 LAB — HEPARIN LEVEL (UNFRACTIONATED)
Heparin Unfractionated: 0.22 IU/mL — ABNORMAL LOW (ref 0.30–0.70)
Heparin Unfractionated: 0.46 IU/mL (ref 0.30–0.70)

## 2010-12-23 LAB — COMPREHENSIVE METABOLIC PANEL
Alkaline Phosphatase: 76 U/L (ref 39–117)
BUN: 8 mg/dL (ref 6–23)
Chloride: 109 mEq/L (ref 96–112)
GFR calc Af Amer: >60 mL/min (ref >60)
Glucose, Bld: 169 mg/dL — ABNORMAL HIGH (ref 70–99)
Potassium: 3.9 mEq/L (ref 3.5–5.1)
Total Bilirubin: 0.1 mg/dL — ABNORMAL LOW (ref 0.3–1.2)

## 2010-12-23 LAB — GLUCOSE, CAPILLARY
Glucose-Capillary: 168 mg/dL — ABNORMAL HIGH (ref 70–99)
Glucose-Capillary: 167 mg/dL — ABNORMAL HIGH (ref 70–99)
Glucose-Capillary: 135 mg/dL — ABNORMAL HIGH (ref 70–99)

## 2010-12-23 LAB — LIPID PANEL: Cholesterol: 167 mg/dL (ref 0–200)

## 2010-12-23 LAB — HEMOGLOBIN A1C: Mean Plasma Glucose: 203 mg/dL — ABNORMAL HIGH (ref <117)

## 2010-12-23 LAB — CARDIAC PANEL(CRET KIN+CKTOT+MB+TROPI): CK, MB: 2.8 ng/mL (ref 0.3–4.0)

## 2010-12-23 MED ORDER — TECHNETIUM TC 99M TETROFOSMIN IV KIT
30.0000 | PACK | Freq: Once | INTRAVENOUS | Status: AC | PRN
  Administered 2010-12-23: 30 via INTRAVENOUS

## 2010-12-23 MED ORDER — TECHNETIUM TC 99M TETROFOSMIN IV KIT
10.0000 | PACK | Freq: Once | INTRAVENOUS | Status: AC | PRN
  Administered 2010-12-23: 10 via INTRAVENOUS

## 2010-12-24 ENCOUNTER — Other Ambulatory Visit (HOSPITAL_COMMUNITY): Payer: BC Managed Care – PPO

## 2010-12-24 LAB — CBC
MCH: 28.1 pg (ref 26.0–34.0)
MCHC: 32.6 g/dL (ref 30.0–36.0)
MCV: 86.3 fL (ref 78.0–100.0)
Platelets: 206 10*3/uL (ref 150–400)
RDW: 13.3 % (ref 11.5–15.5)

## 2010-12-24 LAB — HEPARIN LEVEL (UNFRACTIONATED): Heparin Unfractionated: <0.1 IU/mL — ABNORMAL LOW (ref 0.30–0.70)

## 2011-01-20 NOTE — Discharge Summary (Signed)
NAMEASHEY, TRAMONTANA               ACCOUNT NO.:  [id number]  MEDICAL RECORD NO.:  [id number]  LOCATION:  6526                         FACILITY:  [facility]  PHYSICIAN:  Eduardo Osier. Sharyn Lull, M.D. DATE OF BIRTH:  01-Nov-1945  DATE OF ADMISSION:  12/22/2010 DATE OF DISCHARGE:  12/24/2010                              DISCHARGE SUMMARY   ADMITTING DIAGNOSES: 1. Recurrent chest pain, worrisome for angina, rule out myocardial     infarction. 2. Coronary artery disease. 3. Hypertension. 4. Non-insulin-dependent diabetes mellitus. 5. Hypercholesteremia. 6. Gastroesophageal reflux disease. 7. History of tobacco abuse. 8. Positive family history of coronary artery disease. 9. History of congestive heart failure secondary to diastolic     dysfunction in the past.  DISCHARGE DIAGNOSES: 1. Status post chest pain, myocardial infarction ruled out, negative     Persantine Myoview. 2. Coronary artery disease. 3. Hypertension. 4. Non-insulin-dependent diabetes mellitus. 5. Hypercholesteremia. 6. Gastroesophageal reflux disease. 7. History of tobacco abuse. 8. Positive family history of coronary artery disease.  DISCHARGE MEDICATIONS: 1. Enteric-coated aspirin 81 mg 1 tablet daily. 2. Amlodipine 5 mg 1 tablet daily. 3. Lipitor 20 mg 1 tablet daily. 4. Metformin 500 mg 1 tablet daily. 5. Protonix 40 mg 1 tablet daily. 6. Nitrostat 0.4 mg sublingual use as directed. 7. Extra Strength Tylenol 500 mg 2 tablets every 6 hours as needed. 8. Robitussin DM 15 mL every 4 hours as needed for cough.  DIET:  Low-salt, low-cholesterol, 1800 calories ADA diet.  The patient has been advised to monitor her blood sugar daily.  FOLLOWUP:  With me in 1 week.  ACTIVITY:  Increase activity as tolerated.  CONDITION AT DISCHARGE:  Stable.  BRIEF HISTORY AND HOSPITAL COURSE:  Ms. Davidson is a 65 year old black female with past medical history significant for moderate coronary artery disease, hypertension,  non-insulin-dependent diabetes mellitus, GERD, tobacco abuse, history of CHF secondary to diastolic dysfunction in the past, and family history of coronary artery disease.  She came to the ER complaining of recurrent retrosternal chest pain radiating to the neck and right arm associated with diaphoresis and mild shortness of breath.  She states she took sublingual nitro with partial relief, so decided to come to the ER.  Chest pain was described as tightness, grade 8/10.  Denies any palpitation, lightheadedness, or syncope.  Denies nausea or vomiting.  Denies history of PND, orthopnea, or leg swelling. Denies cough, fever, or chills.  Denies relation of chest pain to food, breathing, or movement.  PAST MEDICAL HISTORY:  As above.  PAST SURGICAL HISTORY:  She had right elbow surgery in the past, had hysterectomy in the past, had tonsillectomy in the past.  ALLERGIES:  No known drug allergies.  MEDICATION AT HOME:  She was on aspirin, amlodipine, Protonix, Crestor, and Nitrostat.  States she stopped her Protonix and Crestor many monthsago.  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 Smoked 1/2 pack per day for 10 plus years, quit few years ago.  No history of alcohol abuse.  FAMILY HISTORY:  Father died of MI in his 66s.  Mother is alive.  She has coronary artery disease.  One sister has CA of breast.  PHYSICAL EXAMINATION:  GENERAL:  She was alert, awake, and oriented x3, in no acute distress. VITAL SIGNS:  Blood pressure was 133/78.  Pulse was 60, regular. HEENT:  Conjunctivae were pink. NECK:  Supple.  No JVD, no bruit. LUNGS:  Clear to auscultation without rhonchi or rales. CARDIOVASCULAR:  S1 and S2 were normal.  There was soft systolic murmur. There was no S3 or S4 gallop. ABDOMEN:  Soft, bowel sounds were present, nontender. EXTREMITIES:  There was no clubbing, cyanosis, or edema.  LABORATORY DATA:  Hemoglobin was 12.5, hematocrit 37.9, and white  count of 8.0 with no shift to the left.  Her sodium was 140, potassium 3.3, glucose 155, BUN 10, and creatinine 0.89.  Repeat electrolytes, sodium was 142, potassium 3.9, glucose 169, BUN 8, creatinine 0.79.  Her two sets of cardiac enzymes were normal.  Cholesterol was 167, LDL 64, HDL was low at 35, triglycerides were elevated at 342, VLDL was 68. Hemoglobin A1c was 8.7.  Admission chest x-ray showed no active cardiopulmonary disease.  Persantine Myoview showed no evidence of stress-induced ischemia with EF of 64%.  BRIEF HOSPITAL COURSE:  The patient was admitted to Telemetry Unit.  MI was ruled out by serial enzymes and EKG.  The patient subsequently underwent Persantine Myoview which showed no evidence of ischemia or infarction with normal EF of 64%.  The patient had episodes of marked sinus bradycardia.  Her beta-blockers were discontinued.  Her heart rate stays in 50s-60s and remained asymptomatic.  The patient states she used to take metformin and Crestor in the past which she has stopped.  We will restart her metformin and Lipitor as per discharge medication list and continue on her home medications.  The patient will be discharged home on above medications and will be followed up in my office in 1 week.  If she continues to have recurrent chest pain, we will pursue GI workup as outpatient.     Eduardo Osier. Sharyn Lull, M.D.     MNH/MEDQ  D:  12/24/2010  T:  12/25/2010  Job:  [id number]  Electronically Signed by Rinaldo Cloud M.D. on 01/20/2011 11:08:26 PM

## 2011-04-01 LAB — D-DIMER, QUANTITATIVE: D-Dimer, Quant: 2.8 — ABNORMAL HIGH

## 2011-04-01 LAB — POCT CARDIAC MARKERS
CKMB, poc: 1.2
CKMB, poc: 2
Operator id: 151321
Troponin i, poc: <0.05
Troponin i, poc: <0.05

## 2011-04-01 LAB — POCT I-STAT, CHEM 8
BUN: 6
Calcium, Ion: 1.18
Chloride: 107
HCT: 45
Sodium: 139
TCO2: 23

## 2011-04-13 LAB — BASIC METABOLIC PANEL
BUN: 8
Chloride: 106
Creatinine, Ser: 0.93
Glucose, Bld: 128 — ABNORMAL HIGH
Potassium: 4

## 2011-04-13 LAB — DIFFERENTIAL
Basophils Absolute: 0.1
Basophils Relative: 1
Eosinophils Absolute: 0.1 — ABNORMAL LOW
Eosinophils Relative: 1
Lymphocytes Relative: 35
Lymphs Abs: 3.4
Monocytes Absolute: 0.6
Monocytes Relative: 6
Neutro Abs: 5.6
Neutrophils Relative %: 57

## 2011-04-13 LAB — CBC
HCT: 40.4
Hemoglobin: 13.2
MCHC: 32.6
MCV: 86.4
Platelets: 252
RBC: 4.68
RDW: 13.5
WBC: 9.7

## 2011-04-13 LAB — POCT CARDIAC MARKERS
CKMB, poc: 1.5
Myoglobin, poc: 95.1
Operator id: 270111
Troponin i, poc: <0.05

## 2011-04-13 LAB — BASIC METABOLIC PANEL WITH GFR
CO2: 25
Calcium: 9.2
GFR calc Af Amer: >60
GFR calc non Af Amer: >60
Sodium: 138

## 2011-04-13 LAB — D-DIMER, QUANTITATIVE: D-Dimer, Quant: 0.42

## 2011-12-11 ENCOUNTER — Other Ambulatory Visit: Payer: Self-pay | Admitting: Oncology

## 2012-11-12 ENCOUNTER — Encounter (HOSPITAL_COMMUNITY): Payer: Self-pay | Admitting: Cardiology

## 2012-11-12 ENCOUNTER — Other Ambulatory Visit: Payer: Self-pay

## 2012-11-12 ENCOUNTER — Observation Stay (HOSPITAL_COMMUNITY)
Admission: EM | Admit: 2012-11-12 | Discharge: 2012-11-13 | Disposition: A | Discharge status: Home / Self Care | Payer: BC Managed Care – PPO | Attending: Cardiovascular Disease | Admitting: Cardiovascular Disease

## 2012-11-12 ENCOUNTER — Emergency Department (HOSPITAL_COMMUNITY): Payer: BC Managed Care – PPO

## 2012-11-12 DIAGNOSIS — E119 Type 2 diabetes mellitus without complications: Secondary | ICD-10-CM | POA: Insufficient documentation

## 2012-11-12 DIAGNOSIS — K219 Gastro-esophageal reflux disease without esophagitis: Secondary | ICD-10-CM | POA: Insufficient documentation

## 2012-11-12 DIAGNOSIS — R0602 Shortness of breath: Secondary | ICD-10-CM | POA: Insufficient documentation

## 2012-11-12 DIAGNOSIS — E7800 Pure hypercholesterolemia, unspecified: Secondary | ICD-10-CM | POA: Insufficient documentation

## 2012-11-12 DIAGNOSIS — R079 Chest pain, unspecified: Secondary | ICD-10-CM

## 2012-11-12 DIAGNOSIS — I10 Essential (primary) hypertension: Secondary | ICD-10-CM | POA: Insufficient documentation

## 2012-11-12 DIAGNOSIS — I509 Heart failure, unspecified: Secondary | ICD-10-CM | POA: Insufficient documentation

## 2012-11-12 DIAGNOSIS — Z221 Carrier of other intestinal infectious diseases: Secondary | ICD-10-CM | POA: Insufficient documentation

## 2012-11-12 DIAGNOSIS — I2510 Atherosclerotic heart disease of native coronary artery without angina pectoris: Secondary | ICD-10-CM | POA: Insufficient documentation

## 2012-11-12 DIAGNOSIS — R0789 Other chest pain: Principal | ICD-10-CM | POA: Insufficient documentation

## 2012-11-12 HISTORY — DX: Heart failure, unspecified: I50.9

## 2012-11-12 LAB — BASIC METABOLIC PANEL
BUN: 6 mg/dL (ref 6–23)
CO2: 24 mEq/L (ref 19–32)
Calcium: 9.6 mg/dL (ref 8.4–10.5)
Chloride: 103 mEq/L (ref 96–112)
Creatinine, Ser: 0.85 mg/dL (ref 0.50–1.10)
GFR calc Af Amer: 80 mL/min — ABNORMAL LOW (ref >90)
GFR calc non Af Amer: 69 mL/min — ABNORMAL LOW (ref >90)
Glucose, Bld: 243 mg/dL — ABNORMAL HIGH (ref 70–99)
Potassium: 3.7 mEq/L (ref 3.5–5.1)
Sodium: 137 mEq/L (ref 135–145)

## 2012-11-12 LAB — CBC
HCT: 40.2 % (ref 36.0–46.0)
Hemoglobin: 13.6 g/dL (ref 12.0–15.0)
MCH: 28.3 pg (ref 26.0–34.0)
MCHC: 33.8 g/dL (ref 30.0–36.0)
MCV: 83.8 fL (ref 78.0–100.0)
Platelets: 223 10*3/uL (ref 150–400)
RBC: 4.8 MIL/uL (ref 3.87–5.11)
RDW: 12.9 % (ref 11.5–15.5)
WBC: 9.5 10*3/uL (ref 4.0–10.5)

## 2012-11-12 LAB — POCT I-STAT TROPONIN I

## 2012-11-12 LAB — PRO B NATRIURETIC PEPTIDE: Pro B Natriuretic peptide (BNP): 59.9 pg/mL (ref 0–125)

## 2012-11-12 LAB — MRSA PCR SCREENING: MRSA by PCR: NEGATIVE

## 2012-11-12 LAB — TROPONIN I: Troponin I: <0.3 ng/mL (ref <0.30)

## 2012-11-12 MED ORDER — HEPARIN BOLUS VIA INFUSION
4000.0000 [IU] | Freq: Once | INTRAVENOUS | Status: AC
  Administered 2012-11-12: 4000 [IU] via INTRAVENOUS

## 2012-11-12 MED ORDER — METOPROLOL TARTRATE 12.5 MG HALF TABLET
12.5000 mg | ORAL_TABLET | Freq: Two times a day (BID) | ORAL | Status: DC
  Filled 2012-11-12: qty 1

## 2012-11-12 MED ORDER — ACETAMINOPHEN 325 MG PO TABS: 650.0000 mg | ORAL_TABLET | ORAL | Status: DC | PRN

## 2012-11-12 MED ORDER — OXYCODONE-ACETAMINOPHEN 5-325 MG PO TABS
1.0000 | ORAL_TABLET | Freq: Once | ORAL | Status: AC
  Administered 2012-11-12: 1 via ORAL
  Filled 2012-11-12: qty 1

## 2012-11-12 MED ORDER — SIMVASTATIN 20 MG PO TABS
20.0000 mg | ORAL_TABLET | Freq: Every day | ORAL | Status: DC
  Administered 2012-11-12: 20 mg via ORAL
  Filled 2012-11-12 (×2): qty 1

## 2012-11-12 MED ORDER — HEPARIN (PORCINE) IN NACL 100-0.45 UNIT/ML-% IJ SOLN
1000.0000 [IU]/h | INTRAMUSCULAR | Status: DC
  Administered 2012-11-12: 1000 [IU]/h via INTRAVENOUS
  Filled 2012-11-12 (×3): qty 250

## 2012-11-12 MED ORDER — ASPIRIN EC 81 MG PO TBEC
81.0000 mg | DELAYED_RELEASE_TABLET | Freq: Every day | ORAL | Status: DC
  Administered 2012-11-13: 81 mg via ORAL
  Filled 2012-11-12: qty 1

## 2012-11-12 MED ORDER — AMLODIPINE BESYLATE 5 MG PO TABS
5.0000 mg | ORAL_TABLET | Freq: Every day | ORAL | Status: DC
  Administered 2012-11-12 – 2012-11-13 (×2): 5 mg via ORAL
  Filled 2012-11-12 (×2): qty 1

## 2012-11-12 MED ORDER — CLOPIDOGREL BISULFATE 75 MG PO TABS
75.0000 mg | ORAL_TABLET | Freq: Every day | ORAL | Status: DC
  Administered 2012-11-13: 75 mg via ORAL
  Filled 2012-11-12 (×2): qty 1

## 2012-11-12 MED ORDER — HYDROMORPHONE HCL PF 1 MG/ML IJ SOLN
1.0000 mg | Freq: Once | INTRAMUSCULAR | Status: AC
  Administered 2012-11-12: 1 mg via INTRAVENOUS

## 2012-11-12 MED ORDER — ALPRAZOLAM 0.25 MG PO TABS: 0.2500 mg | ORAL_TABLET | Freq: Two times a day (BID) | ORAL | Status: DC | PRN

## 2012-11-12 MED ORDER — MORPHINE SULFATE 4 MG/ML IJ SOLN: 4.0000 mg | Freq: Once | INTRAMUSCULAR | Status: DC

## 2012-11-12 MED ORDER — NITROGLYCERIN 0.4 MG SL SUBL
0.4000 mg | SUBLINGUAL_TABLET | SUBLINGUAL | Status: DC | PRN
  Administered 2012-11-12: 0.4 mg via SUBLINGUAL
  Filled 2012-11-12: qty 25

## 2012-11-12 MED ORDER — ASPIRIN 81 MG PO CHEW
324.0000 mg | CHEWABLE_TABLET | ORAL | Status: AC
  Administered 2012-11-12: 324 mg via ORAL
  Filled 2012-11-12: qty 4

## 2012-11-12 MED ORDER — HYDROMORPHONE HCL PF 1 MG/ML IJ SOLN
INTRAMUSCULAR | Status: AC
  Filled 2012-11-12: qty 1

## 2012-11-12 MED ORDER — ASPIRIN 300 MG RE SUPP: 300.0000 mg | RECTAL | Status: AC

## 2012-11-12 MED ORDER — HYDROMORPHONE BOLUS VIA INFUSION
1.0000 mg | Freq: Once | INTRAVENOUS | Status: DC
  Filled 2012-11-12: qty 1

## 2012-11-12 MED ORDER — ONDANSETRON HCL 4 MG/2ML IJ SOLN: 4.0000 mg | Freq: Four times a day (QID) | INTRAMUSCULAR | Status: DC | PRN

## 2012-11-12 NOTE — ED Notes (Signed)
Pt alert, NAD, calm, interactive, skin W&D, resps e/u, speaking in clear sluggish speech, complete sentences, VSS, rates pain 2-3/10, also some mild sob, (denies: other sx at this time). NSR on monitor.

## 2012-11-12 NOTE — Progress Notes (Signed)
ANTICOAGULATION CONSULT NOTE - Initial Consult  Pharmacy Consult for UFH Indication: USAP  No Known Allergies  Patient Measurements: Height: 5\' 7"  (170.2 cm) Weight: 147 lb (66.679 kg) IBW/kg (Calculated) : 61.6 Heparin Dosing Weight: 66kg  Vital Signs: Temp: 97.9 F (36.6 C) (05/11 1413) Temp src: Oral (05/11 1413) BP: 143/82 mmHg (05/11 1800) Pulse Rate: 80 (05/11 1800)  Labs:  Recent Labs  11/12/12 1419  HGB 13.6  HCT 40.2  PLT 223  CREATININE 0.85    Estimated Creatinine Clearance: 61.6 ml/min (by C-G formula based on Cr of 0.85).   Medical History: Past Medical History  Diagnosis Date  . CHF (congestive heart failure)     Medications:   (Not in a hospital admission)  Assessment: 67 y/o female patient admitted with chest pain requiring anticoagulation for r/o MI. EKG wnl, cardiac enzymes remain negative.  Goal of Therapy:  Heparin level 0.3-0.7 units/ml Monitor platelets by anticoagulation protocol: Yes   Plan:  Heparin 4000 unit IV bolus followed by infusion at 1000 units/hr, check 6 hour heparin level with daily cbc and heparin level.  Verlene Mayer, PharmD, BCPS Pager [phone] 11/12/2012,6:17 PM

## 2012-11-12 NOTE — ED Notes (Signed)
Reports CP worse with deep breaths, coughing, mvmt & palpation.

## 2012-11-12 NOTE — ED Notes (Signed)
Pt reports that she started having left sided chest pain that started yesterday and she used her nitro at home with relief. States also had SOB with the pain. Says she was dx with CHF and sees Dr. Sharyn Lull. Denies any n/v. Skin warm and dry.

## 2012-11-12 NOTE — ED Provider Notes (Signed)
History     CSN: [id number]  Arrival date & time 11/12/12  1406   None     Chief Complaint  Patient presents with  . Chest Pain  . Shortness of Breath    (Consider location/radiation/quality/duration/timing/severity/associated sxs/prior treatment) Patient is a 67 y.o. female presenting with chest pain.  Chest Pain Pain location:  Substernal area Pain quality: sharp   Pain radiates to:  L arm and R arm Pain radiates to the back: no   Pain severity:  Severe Onset quality:  Gradual Duration: 15-20 minutes intermittently several times since yesterday. Timing:  Intermittent Progression:  Unchanged Chronicity:  New Context: at rest   Relieved by:  Nothing Exacerbated by: nothing, but she states she has been laying around trying to not exert herself to relieve the pain. Ineffective treatments:  Nitroglycerin Associated symptoms: orthopnea and shortness of breath   Associated symptoms: no abdominal pain, no altered mental status, no back pain, no cough, no diaphoresis, no fever, no headache, no nausea, no near-syncope, no numbness, not vomiting and no weakness   Risk factors: hypertension, prior DVT/PE and smoking   Risk factors: no coronary artery disease and no diabetes mellitus     Past Medical History  Diagnosis Date  . CHF (congestive heart failure)     Past Surgical History  Procedure Laterality Date  . Cardiac surgery      Cath without stent    History reviewed. No pertinent family history.  History  Substance Use Topics  . Smoking status: Current Every Day Smoker    Types: Cigarettes  . Smokeless tobacco: Not on file  . Alcohol Use: No    OB History   Grav Para Term Preterm Abortions TAB SAB Ect Mult Living                  Review of Systems  Constitutional: Negative for fever, chills and diaphoresis.  HENT: Negative for congestion, rhinorrhea, neck pain and neck stiffness.   Respiratory: Positive for shortness of breath. Negative for cough and  wheezing.   Cardiovascular: Positive for chest pain and orthopnea. Negative for leg swelling and near-syncope.  Gastrointestinal: Negative for nausea, vomiting, abdominal pain and diarrhea.  Genitourinary: Negative for dysuria, urgency, frequency, flank pain, vaginal bleeding, vaginal discharge and difficulty urinating.  Musculoskeletal: Negative for back pain.  Skin: Negative for rash.  Neurological: Negative for weakness, numbness and headaches.  Psychiatric/Behavioral: Negative for altered mental status.  All other systems reviewed and are negative.    Allergies  Review of patient's allergies indicates no known allergies.  Home Medications   No current outpatient prescriptions on file.  BP 129/74  Pulse 51  Temp(Src) 97.9 F (36.6 C) (Oral)  Resp 11  Ht 5\' 7"  (1.702 m)  Wt 163 lb 5.8 oz (74.1 kg)  BMI 25.58 kg/m2  SpO2 100%  Physical Exam  Nursing note and vitals reviewed. Constitutional: She is oriented to person, place, and time. She appears well-developed and well-nourished. No distress.  HENT:  Head: Normocephalic and atraumatic.  Mouth/Throat: Oropharynx is clear and moist.  Eyes: Conjunctivae and EOM are normal. Pupils are equal, round, and reactive to light. No scleral icterus.  Neck: Normal range of motion. Neck supple. No JVD present.  Cardiovascular: Normal rate, regular rhythm, normal heart sounds and intact distal pulses.  Exam reveals no gallop and no friction rub.   No murmur heard. Pulmonary/Chest: Effort normal and breath sounds normal. No respiratory distress. She has no wheezes. She has no  rales.  Abdominal: Soft. Bowel sounds are normal. She exhibits no distension. There is no tenderness. There is no rebound and no guarding.  Musculoskeletal: She exhibits no edema.  Neurological: She is alert and oriented to person, place, and time. No cranial nerve deficit. She exhibits normal muscle tone. Coordination normal.  Skin: Skin is warm and dry. She is not  diaphoretic.    ED Course  Procedures (including critical care time)  Labs Reviewed  BASIC METABOLIC PANEL - Abnormal; Notable for the following:    Glucose, Bld 243 (*)    GFR calc non Af Amer 69 (*)    GFR calc Af Amer 80 (*)    All other components within normal limits  GLUCOSE, CAPILLARY - Abnormal; Notable for the following:    Glucose-Capillary 165 (*)    All other components within normal limits  URINALYSIS, ROUTINE W REFLEX MICROSCOPIC - Abnormal; Notable for the following:    APPearance CLOUDY (*)    Glucose, UA 500 (*)    Hgb urine dipstick TRACE (*)    Leukocytes, UA LARGE (*)    All other components within normal limits  URINE MICROSCOPIC-ADD ON - Abnormal; Notable for the following:    Squamous Epithelial / LPF FEW (*)    Bacteria, UA MANY (*)    All other components within normal limits  CBC  PRO B NATRIURETIC PEPTIDE  POCT I-STAT TROPONIN I   Dg Chest 2 View  11/12/2012  *RADIOLOGY REPORT*  Clinical Data: Chest pain and shortness of breath.  CHEST - 2 VIEW  Comparison: Two-view chest x-ray 12/22/2010.  Findings: The heart size is normal.  Emphysematous changes are present.  Mild chronic interstitial coarsening is stable.  No focal airspace disease is evident.  There is no edema or effusion to suggest failure.  The visualized soft tissues and bony thorax are unremarkable.  IMPRESSION:  1.  No acute cardiopulmonary disease. 2.  Emphysema.   Original Report Authenticated By: Marin Roberts, M.D.      1. Chest pain      Date: 11/12/2012  Rate: 86  Rhythm: normal sinus rhythm  QRS Axis: normal  Intervals: normal  ST/T Wave abnormalities: nonspecific T wave changes  Conduction Disutrbances:none  Narrative Interpretation:   Old EKG Reviewed: changes noted and normalization/pseudonormalization of lateral T waves  Impression: 1.  Chest pain 2.  History of HTN 3.  History of CHF  MDM  67 yo F. History of HTN, CHF, no CAD. Present with Chest pain  radiating to both arms, 9/10 in severity, intermittent for 15-20 minutes at a time, SOB, onset yesterday, relieved with NTG. Normal vitals.  NAD.  Cardiopulmonary exam unremarkable.  EKG with NSR, non-specific T wave changes. Negative troponin; labs otherwise remarkable only for hyperglycemia, normal H and H, normal creatinine, normal BNP.  Admitted to cardiology for further workup. Received ASA in the ED.  Holding heparin.  Pain relieved with NTG.         Toney Sang, MD 11/13/12 1145

## 2012-11-12 NOTE — H&P (Signed)
[name] is an 67 y.o. female.   Chief Complaint: Chest pain. HPI: 67 years old female with Left sided sharp chest pain radiating to both arms. Chest pain reproducible on palpation.   Past Medical History  Diagnosis Date  . CHF (congestive heart failure)       Past Surgical History  Procedure Laterality Date  . Cardiac surgery      Cath without stent    History reviewed. No pertinent family history. Social History:  reports that she has been smoking Cigarettes.  She has been smoking about 0.00 packs per day. She does not have any smokeless tobacco history on file. She reports that she does not drink alcohol or use illicit drugs.  Allergies: No Known Allergies   (Not in a hospital admission)  Results for orders placed during the hospital encounter of 11/12/12 (from the past 48 hour(s))  CBC     Status: None   Collection Time    11/12/12  2:19 PM      Result Value Range   WBC 9.5  4.0 - 10.5 K/uL   RBC 4.80  3.87 - 5.11 MIL/uL   Hemoglobin 13.6  12.0 - 15.0 g/dL   HCT 04.5  40.9 - 81.1 %   MCV 83.8  78.0 - 100.0 fL   MCH 28.3  26.0 - 34.0 pg   MCHC 33.8  30.0 - 36.0 g/dL   RDW 91.4  78.2 - 95.6 %   Platelets 223  150 - 400 K/uL  BASIC METABOLIC PANEL     Status: Abnormal   Collection Time    11/12/12  2:19 PM      Result Value Range   Sodium 137  135 - 145 mEq/L   Potassium 3.7  3.5 - 5.1 mEq/L   Chloride 103  96 - 112 mEq/L   CO2 24  19 - 32 mEq/L   Glucose, Bld 243 (*) 70 - 99 mg/dL   BUN 6  6 - 23 mg/dL   Creatinine, Ser 2.13  0.50 - 1.10 mg/dL   Calcium 9.6  8.4 - 08.6 mg/dL   GFR calc non Af Amer 69 (*) >90 mL/min   GFR calc Af Amer 80 (*) >90 mL/min   Comment:            The eGFR has been calculated     using the CKD EPI equation.     This calculation has not been     validated in all clinical     situations.     eGFR's persistently     <90 mL/min signify     possible Chronic Kidney Disease.  PRO B NATRIURETIC PEPTIDE     Status: None   Collection Time    11/12/12  2:19 PM      Result Value Range   Pro B Natriuretic peptide (BNP) 59.9  0 - 125 pg/mL  POCT I-STAT TROPONIN I     Status: None   Collection Time    11/12/12  2:34 PM      Result Value Range   Troponin i, poc 0.00  0.00 - 0.08 ng/mL   Comment 3            Comment: Due to the release kinetics of cTnI,     a negative result within the first hours     of the onset of symptoms does not rule out     myocardial infarction with certainty.     If myocardial  infarction is still suspected,     repeat the test at appropriate intervals.   Dg Chest 2 View  11/12/2012  *RADIOLOGY REPORT*  Clinical Data: Chest pain and shortness of breath.  CHEST - 2 VIEW  Comparison: Two-view chest x-ray 12/22/2010.  Findings: The heart size is normal.  Emphysematous changes are present.  Mild chronic interstitial coarsening is stable.  No focal airspace disease is evident.  There is no edema or effusion to suggest failure.  The visualized soft tissues and bony thorax are unremarkable.  IMPRESSION:  1.  No acute cardiopulmonary disease. 2.  Emphysema.   Original Report Authenticated By: Marin Roberts, M.D.     @ROS @ Constitutional: Negative for fever, chills and diaphoresis.  HENT: Negative for congestion, rhinorrhea, neck pain and neck stiffness.  Respiratory: Positive for shortness of breath. Negative for cough and wheezing.  Cardiovascular: Positive for chest pain and orthopnea. Negative for leg swelling and near-syncope.  Gastrointestinal: Negative for nausea, vomiting, abdominal pain and diarrhea.  Genitourinary: Negative for dysuria, urgency, frequency, flank pain, vaginal bleeding, vaginal discharge and difficulty urinating.  Musculoskeletal: Negative for back pain.  Skin: Negative for rash.  Neurological: Negative for weakness, numbness and headaches.  Psychiatric/Behavioral: Negative for altered mental status.   Physical Exam Blood pressure 143/89, pulse 64, temperature  97.9 F (36.6 C), temperature source Oral, resp. rate 15, SpO2 95.00%. Constitutional: She is oriented to person, place, and time. She appears well-developed and well-nourished. No distress.  HENT: Head: Normocephalic and atraumatic.  Mouth/Throat: Oropharynx is clear and moist.  Eyes: Brown, Conjunctivae and EOM are normal. Pupils are equal, round, and reactive to light. No scleral icterus.  Neck: Normal range of motion. Neck supple. No JVD present.  Cardiovascular: Normal rate, regular rhythm, normal heart sounds and intact distal pulses. Exam reveals no gallop and no friction rub. No murmur heard.  Pulmonary/Chest: Effort normal and breath sounds normal. No respiratory distress. She has no wheezes. She has no rales.  Left sided chest wall tender on paslpation. Abdominal: Soft. Bowel sounds are normal. She exhibits no distension. There is no tenderness. There is no rebound and no guarding.  Musculoskeletal: She exhibits no edema.  Neurological: She is alert and oriented to person, place, and time. No cranial nerve deficit. She exhibits normal muscle tone. Coordination normal.  Skin: Skin is warm and dry. She is not diaphoretic.    Assessment/Plan Chest pain r/o  MI Musculoskeletal chest pain. CAD Hypertension  Nuclear stress test in AM. Home medications.  Merrin Mcvicker S 11/12/2012, 5:28 PM

## 2012-11-12 NOTE — ED Notes (Signed)
Admitting MD, Dr. Algie Coffer at bedside. Aware of continued CP after pain med. Pt also reports took total of NTG x4 at home PTA with minimal relief.

## 2012-11-12 NOTE — ED Notes (Signed)
Report received, assumed care.  

## 2012-11-13 ENCOUNTER — Observation Stay (HOSPITAL_COMMUNITY): Payer: BC Managed Care – PPO

## 2012-11-13 LAB — URINALYSIS, ROUTINE W REFLEX MICROSCOPIC
Nitrite: NEGATIVE
Protein, ur: NEGATIVE mg/dL
Urobilinogen, UA: 0.2 mg/dL (ref 0.0–1.0)

## 2012-11-13 LAB — CBC
HCT: 39.3 % (ref 36.0–46.0)
Hemoglobin: 12.7 g/dL (ref 12.0–15.0)
MCH: 27.5 pg (ref 26.0–34.0)
MCHC: 32.3 g/dL (ref 30.0–36.0)
MCV: 85.2 fL (ref 78.0–100.0)
Platelets: 196 10*3/uL (ref 150–400)
RBC: 4.61 MIL/uL (ref 3.87–5.11)
RDW: 13.2 % (ref 11.5–15.5)
WBC: 8.7 10*3/uL (ref 4.0–10.5)

## 2012-11-13 LAB — PROTIME-INR
INR: 1.02 (ref 0.00–1.49)
Prothrombin Time: 13.3 seconds (ref 11.6–15.2)

## 2012-11-13 LAB — LIPID PANEL
Cholesterol: 183 mg/dL (ref 0–200)
Triglycerides: 270 mg/dL — ABNORMAL HIGH (ref <150)

## 2012-11-13 LAB — TROPONIN I: Troponin I: <0.3 ng/mL (ref <0.30)

## 2012-11-13 LAB — BASIC METABOLIC PANEL
CO2: 28 mEq/L (ref 19–32)
Calcium: 9.1 mg/dL (ref 8.4–10.5)
GFR calc non Af Amer: 59 mL/min — ABNORMAL LOW (ref >90)
Sodium: 141 mEq/L (ref 135–145)

## 2012-11-13 LAB — URINE MICROSCOPIC-ADD ON

## 2012-11-13 LAB — HEPARIN LEVEL (UNFRACTIONATED): Heparin Unfractionated: 0.32 IU/mL (ref 0.30–0.70)

## 2012-11-13 MED ORDER — ATORVASTATIN CALCIUM 10 MG PO TABS: 10.0000 mg | ORAL_TABLET | Freq: Every day | ORAL | Status: DC

## 2012-11-13 MED ORDER — ASPIRIN 81 MG PO TBEC: 81.0000 mg | DELAYED_RELEASE_TABLET | Freq: Every day | ORAL | Status: DC

## 2012-11-13 MED ORDER — TECHNETIUM TC 99M SESTAMIBI GENERIC - CARDIOLITE
10.0000 | Freq: Once | INTRAVENOUS | Status: AC | PRN
  Administered 2012-11-13: 10 via INTRAVENOUS

## 2012-11-13 MED ORDER — PANTOPRAZOLE SODIUM 40 MG PO TBEC: 40.0000 mg | DELAYED_RELEASE_TABLET | Freq: Every day | ORAL | Status: DC

## 2012-11-13 MED ORDER — REGADENOSON 0.4 MG/5ML IV SOLN
INTRAVENOUS | Status: AC
  Administered 2012-11-13: 0.4 mg via INTRAVENOUS
  Filled 2012-11-13: qty 5

## 2012-11-13 MED ORDER — TECHNETIUM TC 99M SESTAMIBI GENERIC - CARDIOLITE
30.0000 | Freq: Once | INTRAVENOUS | Status: AC | PRN
  Administered 2012-11-13: 30 via INTRAVENOUS

## 2012-11-13 MED ORDER — REGADENOSON 0.4 MG/5ML IV SOLN
0.4000 mg | Freq: Once | INTRAVENOUS | Status: AC
  Filled 2012-11-13: qty 5

## 2012-11-13 NOTE — Progress Notes (Signed)
ANTICOAGULATION CONSULT NOTE - Initial Consult  Pharmacy Consult for UFH Indication: USAP  No Known Allergies  Patient Measurements: Height: 5\' 7"  (170.2 cm) Weight: 147 lb (66.679 kg) IBW/kg (Calculated) : 61.6 Heparin Dosing Weight: 66kg  Vital Signs: Temp: 97.7 F (36.5 C) (05/12 0000) Temp src: Oral (05/12 0000) BP: 120/52 mmHg (05/12 0000) Pulse Rate: 49 (05/12 0000)  Labs:  Recent Labs  11/12/12 1419 11/12/12 1731 11/12/12 2300 11/13/12 0028  HGB 13.6  --   --   --   HCT 40.2  --   --   --   PLT 223  --   --   --   HEPARINUNFRC  --   --   --  0.32  CREATININE 0.85  --   --   --   TROPONINI  --  <0.30 <0.30  --     Estimated Creatinine Clearance: 61.6 ml/min (by C-G formula based on Cr of 0.85).   Medical History: Past Medical History  Diagnosis Date  . CHF (congestive heart failure)     Medications:  Prescriptions prior to admission  Medication Sig Dispense Refill  . amLODipine (NORVASC) 5 MG tablet Take 5 mg by mouth daily.      . nitroGLYCERIN (NITROSTAT) 0.4 MG SL tablet Place 0.4 mg under the tongue every 5 (five) minutes as needed for chest pain.        Assessment: 67 y/o female patient admitted with chest pain requiring anticoagulation for r/o MI. EKG wnl, cardiac enzymes remain negative. Heparin level is therapeutic at 0.32 with no bleeding noted  Goal of Therapy:  Heparin level 0.3-0.7 units/ml Monitor platelets by anticoagulation protocol: Yes   Plan:   Continue at current rate and follow up daily labs  [name]

## 2012-11-13 NOTE — Progress Notes (Addendum)
Inpatient Diabetes Program Recommendations  AACE/ADA: New Consensus Statement on Inpatient Glycemic Control (2013)  Target Ranges:  Prepandial:   less than 140 mg/dL      Peak postprandial:   less than 180 mg/dL (1-2 hours)      Critically ill patients:  140 - 180 mg/dL   Hyperglycemia without any documentation of have DM  Results for [name], [name] (MRN [id number]) as of 11/13/2012 10:55  Ref. Range 11/12/2012 14:19 11/13/2012 05:05  Glucose Latest Range: 70-99 mg/dL 782 (H) 956 (H)   These are lab drawn glucose values.  Please check cbg q 4hrs while NPO and once eating tidwc. When pt in the ED, DM was noted in assessment /plan Inpatient Diabetes Program Recommendations Correction (SSI): Please start some correction insulin q 4 hours while NPO HgbA1C: Please check current.  (last one documented was 8.7% on 12/23/2010  Thank you, Lenor Coffin, RN, CNS, Diabetes Coordinator [phone])

## 2012-11-13 NOTE — Plan of Care (Signed)
Problem: Phase I Progression Outcomes Goal: Hemodynamically stable Outcome: Adequate for Discharge HR remains in 40's - 50's.  MD is aware and ordered discharge home  Problem: Phase II Progression Outcomes Goal: Hemodynamically stable Outcome: Adequate for Discharge HR remains 40-50's.  MD is aware and has written discharge orders

## 2012-11-13 NOTE — Progress Notes (Signed)
Pt discharged home with significant other.  Pt belongings were given to pt prior to d/c.  Pt was alert and oriented prior to d/c without questions or concerns, discharge teaching was accepted by pt.  Pt was given hardcopy prescriptions (electronically signed per MD Sharyn Lull), two additional prescriptions were called into preferred pharmacy at pt request by MD.

## 2012-11-13 NOTE — Discharge Summary (Signed)
  Discharge summary dictated on 11/13/2012 dictation number is [phone]

## 2012-11-13 NOTE — Progress Notes (Signed)
Subjective:   patient denies any further chest pains or shortness of breath  Objective:  Vital Signs in the last 24 hours: Temp:  [97.7 F (36.5 C)-98.3 F (36.8 C)] 98.3 F (36.8 C) (05/12 0400) Pulse Rate:  [44-86] 55 (05/12 0500) Resp:  [10-21] 14 (05/12 0500) BP: (104-164)/(32-127) 134/73 mmHg (05/12 0950) SpO2:  [94 %-100 %] 94 % (05/12 0500) Weight:  [66.679 kg (147 lb)-74.1 kg (163 lb 5.8 oz)] 74.1 kg (163 lb 5.8 oz) (05/12 0500)  Intake/Output from previous day: 05/11 0701 - 05/12 0700 In: 277.7 [P.O.:240; I.V.:37.7] Out: 1300 [Urine:1300] Intake/Output from this shift:    Physical Exam: Neck: no adenopathy, no carotid bruit, no JVD and supple, symmetrical, trachea midline Lungs: clear to auscultation bilaterally Heart: regular rate and rhythm, S1, S2 normal, no murmur, click, rub or gallop Abdomen: soft, non-tender; bowel sounds normal; no masses,  no organomegaly Extremities: extremities normal, atraumatic, no cyanosis or edema  Lab Results:  Recent Labs  11/12/12 1419 11/13/12 0505  WBC 9.5 8.7  HGB 13.6 12.7  PLT 223 196    Recent Labs  11/12/12 1419 11/13/12 0505  NA 137 141  K 3.7 4.0  CL 103 107  CO2 24 28  GLUCOSE 243* 216*  BUN 6 7  CREATININE 0.85 0.97    Recent Labs  11/12/12 2300 11/13/12 0335  TROPONINI <0.30 <0.30   Hepatic Function Panel No results found for this basename: PROT, ALBUMIN, AST, ALT, ALKPHOS, BILITOT, BILIDIR, IBILI,  in the last 72 hours  Recent Labs  11/13/12 0505  CHOL 183   No results found for this basename: PROTIME,  in the last 72 hours  Imaging: Imaging results have been reviewed and Dg Chest 2 View  11/12/2012  *RADIOLOGY REPORT*  Clinical Data: Chest pain and shortness of breath.  CHEST - 2 VIEW  Comparison: Two-view chest x-ray 12/22/2010.  Findings: The heart size is normal.  Emphysematous changes are present.  Mild chronic interstitial coarsening is stable.  No focal airspace disease is evident.   There is no edema or effusion to suggest failure.  The visualized soft tissues and bony thorax are unremarkable.  IMPRESSION:  1.  No acute cardiopulmonary disease. 2.  Emphysema.   Original Report Authenticated By: Marin Roberts, M.D.     Cardiac Studies:  Assessment/Plan:  Status post atypical chest pain MI ruled out Hypertension Diabetes matters GERD Hypercholesteremia History of tobacco abuse Past family history of coronary artery disease History of  congestive heart failure secondary to preserved LV systolic function Plan Scheduled for nuclear stress test today  LOS: 1 day    [name] N 11/13/2012, 10:11 AM

## 2012-11-13 NOTE — Progress Notes (Signed)
Pt arrived back on unit at approximately 1102.  Pt is alert and oriented.  Vital signs and assessment were documented in Epic upon arrival on 2600.  Lab was called to collect ordered labs once pt arrived back on floor.

## 2012-11-14 LAB — URINE CULTURE

## 2012-11-14 NOTE — Discharge Summary (Signed)
[name], [name]               ACCOUNT NO.:  [id number]  MEDICAL RECORD NO.:  [id number]  LOCATION:  2615                         FACILITY:  [facility]  PHYSICIAN:  Eduardo Osier. Sharyn Lull, M.D. DATE OF BIRTH:  04-11-1946  DATE OF ADMISSION:  11/12/2012 DATE OF DISCHARGE:  11/13/2012                              DISCHARGE SUMMARY   ADMITTING DIAGNOSES: 1. Atypical chest pain rule out myocardial infarction. 2. Hypertension. 3. Diabetes mellitus controlled by diet. 4. Hypercholesteremia. 5. Gastroesophageal reflux disease. 6. History of tobacco abuse. 7. Positive family history of coronary artery disease.  DISCHARGE DIAGNOSES: 1. Status post atypical chest pain myocardial infarction ruled out.     Negative nuclear stress test. 2. Hypertension. 3. Diabetes mellitus controlled by diet. 4. Gastroesophageal reflux disease. 5. Hypercholesteremia. 6. History of tobacco abuse. 7. Positive family history of coronary artery disease. 8. History of congestive heart failure secondary to preserved LV     systolic function in the past, stable.  DISCHARGE HOME MEDICATIONS: 1. Enteric-coated aspirin 81 mg 1 tablet daily. 2. Atorvastatin 10 mg 1 tablet daily. 3. Protonix 40 mg 1 tablet daily. 4. Amlodipine 5 mg daily. 5. Nitrostat 0.4 mg sublingual use as directed.  DIET:  Low salt, low cholesterol, 1800 calories ADA diet.  The patient has been advised to monitor blood pressure and blood sugar at home regularly.  CONDITION AT DISCHARGE:  Stable.  Follow up with me in 1 week.  BRIEF HISTORY AND HOSPITAL COURSE:  [name] is a 67 year old female with past medical history significant for multiple medical problems, i.e., coronary artery disease, hypertension, diabetes mellitus, hypercholesteremia, GERD, history of tobacco abuse, positive family history of coronary artery disease, history of congestive heart failure secondary to preserved LV systolic function was admitted by Dr.  Algie Coffer because of left-sided sharp chest pain radiating to both arms, without any associated symptoms.  Chest pain was reproducible on palpation.  PHYSICAL EXAMINATION:  GENERAL:  She was alert, awake, oriented x3, in no acute distress. VITAL SIGNS:  Blood pressure was 143/89, pulse was 64.  She was afebrile.  Conjunctivae was pink. NECK:  Supple.  No JVD. LUNGS:  Clear to auscultation without rhonchi or rales. CARDIOVASCULAR:  S1, S2 was normal.  There was no systolic murmur or gallop. ABDOMEN:  Soft.  Bowel sounds were present.  Nontender. EXTREMITIES:  No clubbing, cyanosis, or edema.  LABS:  Three sets of cardiac enzymes were negative.  Sodium was 137, potassium 3.7, BUN 6, creatinine 0.85, glucose was 243.  Repeat blood sugar was 216.  Cholesterol was 183, triglycerides 270, LDL was 90. Hemoglobin was 13.6, hematocrit 40.2, white count of 9.5.  EKG today showed sinus bradycardia with no acute ischemic changes.  Chest x-ray showed no acute cardiopulmonary disease, changes of emphysema.  BRIEF HOSPITAL COURSE:  The patient was admitted to telemetry unit.  MI was ruled out by serial enzymes and EKG.  The patient subsequently underwent Lexiscan Myoview, which showed no evidence of reversible ischemia or scar with normal EF of 57%.  The patient did not have any episodes of anginal chest pain during the hospital stay.  The patient will be discharged home on above medications and will  be followed up in my office, as outpatient.  The patient was advised to monitor blood sugar closely, may need oral hypoglycemic agents as outpatient if blood sugar stays elevated.     Eduardo Osier. Sharyn Lull, M.D.     M[facility]/MEDQ  D:  11/13/2012  T:  11/14/2012  Job:  [id number]

## 2012-11-16 NOTE — ED Provider Notes (Signed)
I saw and evaluated the patient, reviewed the resident's note and I agree with the findings and plan.  67yF with CP. Initial w/u fairly unremarkable but some typical features. Will admit for further eval.   Raeford Razor, MD 11/16/12 [phone]

## 2013-10-17 ENCOUNTER — Other Ambulatory Visit (HOSPITAL_COMMUNITY): Payer: Self-pay | Admitting: Cardiology

## 2013-10-17 DIAGNOSIS — R079 Chest pain, unspecified: Secondary | ICD-10-CM

## 2013-10-31 ENCOUNTER — Encounter (HOSPITAL_COMMUNITY): Payer: BC Managed Care – PPO

## 2013-10-31 ENCOUNTER — Ambulatory Visit (HOSPITAL_COMMUNITY): Payer: BC Managed Care – PPO

## 2013-11-07 ENCOUNTER — Encounter (HOSPITAL_COMMUNITY)
Admission: RE | Admit: 2013-11-07 | Discharge: 2013-11-07 | Disposition: A | Discharge status: Home / Self Care | Payer: BC Managed Care – PPO | Source: Ambulatory Visit | Attending: Cardiology | Admitting: Cardiology

## 2013-11-07 ENCOUNTER — Ambulatory Visit (HOSPITAL_COMMUNITY)
Admission: RE | Admit: 2013-11-07 | Discharge: 2013-11-07 | Disposition: A | Discharge status: Home / Self Care | Payer: BC Managed Care – PPO | Source: Ambulatory Visit | Attending: Cardiology | Admitting: Cardiology

## 2013-11-07 DIAGNOSIS — R079 Chest pain, unspecified: Secondary | ICD-10-CM | POA: Insufficient documentation

## 2013-11-07 MED ORDER — REGADENOSON 0.4 MG/5ML IV SOLN
0.4000 mg | Freq: Once | INTRAVENOUS | Status: AC
  Administered 2013-11-07: 0.4 mg via INTRAVENOUS

## 2013-11-07 MED ORDER — TECHNETIUM TC 99M SESTAMIBI GENERIC - CARDIOLITE
10.0000 | Freq: Once | INTRAVENOUS | Status: AC | PRN
  Administered 2013-11-07: 10 via INTRAVENOUS

## 2013-11-07 MED ORDER — TECHNETIUM TC 99M SESTAMIBI GENERIC - CARDIOLITE
30.0000 | Freq: Once | INTRAVENOUS | Status: AC | PRN
  Administered 2013-11-07: 30 via INTRAVENOUS

## 2013-11-07 MED ORDER — REGADENOSON 0.4 MG/5ML IV SOLN: 0.4000 mg | Freq: Once | INTRAVENOUS | Status: DC

## 2013-11-07 MED ORDER — REGADENOSON 0.4 MG/5ML IV SOLN
INTRAVENOUS | Status: AC
  Filled 2013-11-07: qty 5

## 2014-07-05 DIAGNOSIS — E119 Type 2 diabetes mellitus without complications: Secondary | ICD-10-CM

## 2014-07-05 HISTORY — DX: Type 2 diabetes mellitus without complications: E11.9

## 2015-01-28 ENCOUNTER — Other Ambulatory Visit: Payer: Self-pay | Admitting: Cardiology

## 2015-01-28 DIAGNOSIS — R079 Chest pain, unspecified: Secondary | ICD-10-CM

## 2015-02-07 ENCOUNTER — Encounter (HOSPITAL_COMMUNITY): Admission: RE | Admit: 2015-02-07 | Payer: BC Managed Care – PPO | Source: Ambulatory Visit

## 2015-02-07 ENCOUNTER — Encounter (HOSPITAL_COMMUNITY)
Admission: RE | Admit: 2015-02-07 | Discharge: 2015-02-07 | Disposition: A | Discharge status: Home / Self Care | Payer: BC Managed Care – PPO | Source: Ambulatory Visit | Attending: Cardiology | Admitting: Cardiology

## 2015-02-07 ENCOUNTER — Encounter (HOSPITAL_COMMUNITY): Payer: BC Managed Care – PPO

## 2015-02-07 DIAGNOSIS — R079 Chest pain, unspecified: Secondary | ICD-10-CM

## 2015-02-07 LAB — BASIC METABOLIC PANEL
Anion gap: 7 (ref 5–15)
BUN: 6 mg/dL (ref 6–20)
CO2: 28 mmol/L (ref 22–32)
CREATININE: 0.96 mg/dL (ref 0.44–1.00)
Calcium: 9.5 mg/dL (ref 8.9–10.3)
Chloride: 105 mmol/L (ref 101–111)
GFR calc Af Amer: >60 mL/min (ref >60)
GFR calc non Af Amer: 59 mL/min — ABNORMAL LOW (ref >60)
GLUCOSE: 282 mg/dL — AB (ref 65–99)
POTASSIUM: 4.3 mmol/L (ref 3.5–5.1)
Sodium: 140 mmol/L (ref 135–145)

## 2015-02-07 LAB — PROTIME-INR
INR: 1.01 (ref 0.00–1.49)
Prothrombin Time: 13.5 seconds (ref 11.6–15.2)

## 2015-02-07 LAB — CBC
HEMATOCRIT: 40.9 % (ref 36.0–46.0)
HEMOGLOBIN: 13.3 g/dL (ref 12.0–15.0)
MCH: 27.8 pg (ref 26.0–34.0)
MCHC: 32.5 g/dL (ref 30.0–36.0)
MCV: 85.4 fL (ref 78.0–100.0)
PLATELETS: 209 10*3/uL (ref 150–400)
RBC: 4.79 MIL/uL (ref 3.87–5.11)
RDW: 12.8 % (ref 11.5–15.5)
WBC: 7.7 10*3/uL (ref 4.0–10.5)

## 2015-02-07 LAB — LIPID PANEL
Cholesterol: 175 mg/dL (ref 0–200)
HDL: 36 mg/dL — AB (ref >40)
LDL CALC: 107 mg/dL — AB (ref 0–99)
Total CHOL/HDL Ratio: 4.9 RATIO
Triglycerides: 158 mg/dL — ABNORMAL HIGH (ref <150)
VLDL: 32 mg/dL (ref 0–40)

## 2015-02-07 LAB — HEPATIC FUNCTION PANEL
ALT: 16 U/L (ref 14–54)
AST: 18 U/L (ref 15–41)
Albumin: 4.1 g/dL (ref 3.5–5.0)
Alkaline Phosphatase: 73 U/L (ref 38–126)
Bilirubin, Direct: <0.1 mg/dL — ABNORMAL LOW (ref 0.1–0.5)
TOTAL PROTEIN: 6.6 g/dL (ref 6.5–8.1)
Total Bilirubin: 0.6 mg/dL (ref 0.3–1.2)

## 2015-02-07 MED ORDER — REGADENOSON 0.4 MG/5ML IV SOLN: 0.4000 mg | Freq: Once | INTRAVENOUS | Status: AC

## 2015-02-07 MED ORDER — TECHNETIUM TC 99M SESTAMIBI GENERIC - CARDIOLITE
10.0000 | Freq: Once | INTRAVENOUS | Status: AC | PRN
  Administered 2015-02-07: 10 via INTRAVENOUS

## 2015-02-07 MED ORDER — TECHNETIUM TC 99M SESTAMIBI GENERIC - CARDIOLITE
30.0000 | Freq: Once | INTRAVENOUS | Status: AC | PRN
  Administered 2015-02-07: 30 via INTRAVENOUS

## 2015-02-07 MED ORDER — REGADENOSON 0.4 MG/5ML IV SOLN
INTRAVENOUS | Status: AC
  Administered 2015-02-07: 0.4 mg via INTRAVENOUS
  Filled 2015-02-07: qty 5

## 2015-02-08 LAB — HEMOGLOBIN A1C
Hgb A1c MFr Bld: 11 % — ABNORMAL HIGH (ref 4.8–5.6)
Mean Plasma Glucose: 269 mg/dL

## 2015-04-05 DIAGNOSIS — I639 Cerebral infarction, unspecified: Secondary | ICD-10-CM

## 2015-04-05 DIAGNOSIS — Q2112 Patent foramen ovale: Secondary | ICD-10-CM

## 2015-04-05 DIAGNOSIS — Q211 Atrial septal defect: Secondary | ICD-10-CM

## 2015-04-05 HISTORY — DX: Patent foramen ovale: Q21.12

## 2015-04-05 HISTORY — DX: Cerebral infarction, unspecified: I63.9

## 2015-04-05 HISTORY — DX: Atrial septal defect: Q21.1

## 2015-04-28 ENCOUNTER — Emergency Department (HOSPITAL_COMMUNITY): Payer: Medicare Other

## 2015-04-28 ENCOUNTER — Inpatient Hospital Stay (HOSPITAL_COMMUNITY)
Admission: EM | Admit: 2015-04-28 | Discharge: 2015-05-06 | DRG: 065 | Disposition: A | Discharge status: Skilled Nursing Facility | Payer: Medicare Other | Attending: Cardiology | Admitting: Cardiology

## 2015-04-28 ENCOUNTER — Inpatient Hospital Stay (HOSPITAL_COMMUNITY): Payer: Medicare Other

## 2015-04-28 ENCOUNTER — Encounter (HOSPITAL_COMMUNITY): Payer: Self-pay | Admitting: Neurology

## 2015-04-28 DIAGNOSIS — I110 Hypertensive heart disease with heart failure: Secondary | ICD-10-CM | POA: Diagnosis present

## 2015-04-28 DIAGNOSIS — R69 Illness, unspecified: Secondary | ICD-10-CM | POA: Diagnosis not present

## 2015-04-28 DIAGNOSIS — Q211 Atrial septal defect: Secondary | ICD-10-CM

## 2015-04-28 DIAGNOSIS — Z9114 Patient's other noncompliance with medication regimen: Secondary | ICD-10-CM

## 2015-04-28 DIAGNOSIS — F17210 Nicotine dependence, cigarettes, uncomplicated: Secondary | ICD-10-CM | POA: Diagnosis present

## 2015-04-28 DIAGNOSIS — K626 Ulcer of anus and rectum: Secondary | ICD-10-CM | POA: Diagnosis not present

## 2015-04-28 DIAGNOSIS — R471 Dysarthria and anarthria: Secondary | ICD-10-CM | POA: Diagnosis present

## 2015-04-28 DIAGNOSIS — R6889 Other general symptoms and signs: Secondary | ICD-10-CM | POA: Diagnosis present

## 2015-04-28 DIAGNOSIS — Z7982 Long term (current) use of aspirin: Secondary | ICD-10-CM

## 2015-04-28 DIAGNOSIS — G8191 Hemiplegia, unspecified affecting right dominant side: Secondary | ICD-10-CM | POA: Diagnosis present

## 2015-04-28 DIAGNOSIS — Z6825 Body mass index (BMI) 25.0-25.9, adult: Secondary | ICD-10-CM | POA: Diagnosis not present

## 2015-04-28 DIAGNOSIS — R739 Hyperglycemia, unspecified: Secondary | ICD-10-CM | POA: Diagnosis not present

## 2015-04-28 DIAGNOSIS — R001 Bradycardia, unspecified: Secondary | ICD-10-CM | POA: Diagnosis present

## 2015-04-28 DIAGNOSIS — I6350 Cerebral infarction due to unspecified occlusion or stenosis of unspecified cerebral artery: Secondary | ICD-10-CM | POA: Diagnosis not present

## 2015-04-28 DIAGNOSIS — E669 Obesity, unspecified: Secondary | ICD-10-CM | POA: Diagnosis present

## 2015-04-28 DIAGNOSIS — M6281 Muscle weakness (generalized): Secondary | ICD-10-CM | POA: Diagnosis not present

## 2015-04-28 DIAGNOSIS — I2510 Atherosclerotic heart disease of native coronary artery without angina pectoris: Secondary | ICD-10-CM | POA: Diagnosis present

## 2015-04-28 DIAGNOSIS — G8190 Hemiplegia, unspecified affecting unspecified side: Secondary | ICD-10-CM | POA: Diagnosis not present

## 2015-04-28 DIAGNOSIS — R4701 Aphasia: Secondary | ICD-10-CM | POA: Diagnosis present

## 2015-04-28 DIAGNOSIS — E1159 Type 2 diabetes mellitus with other circulatory complications: Secondary | ICD-10-CM | POA: Diagnosis not present

## 2015-04-28 DIAGNOSIS — Q2112 Patent foramen ovale: Secondary | ICD-10-CM

## 2015-04-28 DIAGNOSIS — I69351 Hemiplegia and hemiparesis following cerebral infarction affecting right dominant side: Secondary | ICD-10-CM | POA: Diagnosis not present

## 2015-04-28 DIAGNOSIS — I10 Essential (primary) hypertension: Secondary | ICD-10-CM | POA: Diagnosis not present

## 2015-04-28 DIAGNOSIS — I63322 Cerebral infarction due to thrombosis of left anterior cerebral artery: Secondary | ICD-10-CM | POA: Diagnosis not present

## 2015-04-28 DIAGNOSIS — E785 Hyperlipidemia, unspecified: Secondary | ICD-10-CM | POA: Diagnosis present

## 2015-04-28 DIAGNOSIS — I5022 Chronic systolic (congestive) heart failure: Secondary | ICD-10-CM | POA: Diagnosis present

## 2015-04-28 DIAGNOSIS — K219 Gastro-esophageal reflux disease without esophagitis: Secondary | ICD-10-CM | POA: Diagnosis present

## 2015-04-28 DIAGNOSIS — Z8249 Family history of ischemic heart disease and other diseases of the circulatory system: Secondary | ICD-10-CM | POA: Diagnosis not present

## 2015-04-28 DIAGNOSIS — I63522 Cerebral infarction due to unspecified occlusion or stenosis of left anterior cerebral artery: Secondary | ICD-10-CM | POA: Diagnosis present

## 2015-04-28 DIAGNOSIS — R29704 NIHSS score 4: Secondary | ICD-10-CM | POA: Diagnosis present

## 2015-04-28 DIAGNOSIS — E1165 Type 2 diabetes mellitus with hyperglycemia: Secondary | ICD-10-CM | POA: Diagnosis present

## 2015-04-28 DIAGNOSIS — F801 Expressive language disorder: Secondary | ICD-10-CM | POA: Diagnosis not present

## 2015-04-28 DIAGNOSIS — I6611 Occlusion and stenosis of right anterior cerebral artery: Secondary | ICD-10-CM | POA: Diagnosis not present

## 2015-04-28 DIAGNOSIS — F22 Delusional disorders: Secondary | ICD-10-CM

## 2015-04-28 DIAGNOSIS — I639 Cerebral infarction, unspecified: Secondary | ICD-10-CM

## 2015-04-28 DIAGNOSIS — K625 Hemorrhage of anus and rectum: Secondary | ICD-10-CM | POA: Diagnosis not present

## 2015-04-28 DIAGNOSIS — R29810 Facial weakness: Secondary | ICD-10-CM | POA: Diagnosis present

## 2015-04-28 DIAGNOSIS — Z8673 Personal history of transient ischemic attack (TIA), and cerebral infarction without residual deficits: Secondary | ICD-10-CM | POA: Diagnosis not present

## 2015-04-28 DIAGNOSIS — R4781 Slurred speech: Secondary | ICD-10-CM | POA: Diagnosis not present

## 2015-04-28 DIAGNOSIS — R531 Weakness: Secondary | ICD-10-CM | POA: Diagnosis not present

## 2015-04-28 DIAGNOSIS — I69320 Aphasia following cerebral infarction: Secondary | ICD-10-CM | POA: Diagnosis not present

## 2015-04-28 DIAGNOSIS — I69321 Dysphasia following cerebral infarction: Secondary | ICD-10-CM | POA: Diagnosis not present

## 2015-04-28 DIAGNOSIS — K922 Gastrointestinal hemorrhage, unspecified: Secondary | ICD-10-CM | POA: Diagnosis not present

## 2015-04-28 DIAGNOSIS — I6789 Other cerebrovascular disease: Secondary | ICD-10-CM | POA: Diagnosis not present

## 2015-04-28 HISTORY — DX: Type 2 diabetes mellitus without complications: E11.9

## 2015-04-28 HISTORY — DX: Essential (primary) hypertension: I10

## 2015-04-28 LAB — COMPREHENSIVE METABOLIC PANEL
ALBUMIN: 3.9 g/dL (ref 3.5–5.0)
ALBUMIN: 4.1 g/dL (ref 3.5–5.0)
ALK PHOS: 81 U/L (ref 38–126)
ALT: 16 U/L (ref 14–54)
ALT: 17 U/L (ref 14–54)
ANION GAP: 12 (ref 5–15)
ANION GAP: 8 (ref 5–15)
AST: 16 U/L (ref 15–41)
AST: 17 U/L (ref 15–41)
Alkaline Phosphatase: 73 U/L (ref 38–126)
BILIRUBIN TOTAL: 0.7 mg/dL (ref 0.3–1.2)
BUN: 10 mg/dL (ref 6–20)
BUN: 8 mg/dL (ref 6–20)
CALCIUM: 9.3 mg/dL (ref 8.9–10.3)
CALCIUM: 9.9 mg/dL (ref 8.9–10.3)
CO2: 21 mmol/L — AB (ref 22–32)
CO2: 28 mmol/L (ref 22–32)
CREATININE: 0.85 mg/dL (ref 0.44–1.00)
Chloride: 106 mmol/L (ref 101–111)
Chloride: 105 mmol/L (ref 101–111)
Creatinine, Ser: 0.97 mg/dL (ref 0.44–1.00)
GFR calc non Af Amer: 58 mL/min — ABNORMAL LOW (ref >60)
GFR calc non Af Amer: >60 mL/min (ref >60)
GLUCOSE: 380 mg/dL — AB (ref 65–99)
GLUCOSE: 221 mg/dL — AB (ref 65–99)
POTASSIUM: 4 mmol/L (ref 3.5–5.1)
Potassium: 3.7 mmol/L (ref 3.5–5.1)
SODIUM: 139 mmol/L (ref 135–145)
SODIUM: 141 mmol/L (ref 135–145)
TOTAL PROTEIN: 6.3 g/dL — AB (ref 6.5–8.1)
TOTAL PROTEIN: 6.8 g/dL (ref 6.5–8.1)
Total Bilirubin: 0.6 mg/dL (ref 0.3–1.2)

## 2015-04-28 LAB — CBC
HCT: 41 % (ref 36.0–46.0)
HEMATOCRIT: 44.4 % (ref 36.0–46.0)
HEMOGLOBIN: 13.4 g/dL (ref 12.0–15.0)
HEMOGLOBIN: 14.5 g/dL (ref 12.0–15.0)
MCH: 27.9 pg (ref 26.0–34.0)
MCH: 27.8 pg (ref 26.0–34.0)
MCHC: 32.7 g/dL (ref 30.0–36.0)
MCHC: 32.7 g/dL (ref 30.0–36.0)
MCV: 85.2 fL (ref 78.0–100.0)
MCV: 85.2 fL (ref 78.0–100.0)
Platelets: 210 10*3/uL (ref 150–400)
Platelets: 245 10*3/uL (ref 150–400)
RBC: 4.81 MIL/uL (ref 3.87–5.11)
RBC: 5.21 MIL/uL — AB (ref 3.87–5.11)
RDW: 12.8 % (ref 11.5–15.5)
RDW: 13 % (ref 11.5–15.5)
WBC: 9.4 10*3/uL (ref 4.0–10.5)
WBC: 10.2 10*3/uL (ref 4.0–10.5)

## 2015-04-28 LAB — APTT: APTT: 26 s (ref 24–37)

## 2015-04-28 LAB — DIFFERENTIAL
BASOS ABS: 0 10*3/uL (ref 0.0–0.1)
Basophils Relative: 0 %
EOS ABS: 0.1 10*3/uL (ref 0.0–0.7)
EOS PCT: 1 %
LYMPHS PCT: 27 %
Lymphs Abs: 2.5 10*3/uL (ref 0.7–4.0)
MONO ABS: 0.7 10*3/uL (ref 0.1–1.0)
Monocytes Relative: 7 %
NEUTROS PCT: 65 %
Neutro Abs: 6.1 10*3/uL (ref 1.7–7.7)

## 2015-04-28 LAB — I-STAT CHEM 8, ED
BUN: 12 mg/dL (ref 6–20)
CREATININE: 0.9 mg/dL (ref 0.44–1.00)
Calcium, Ion: 1.12 mmol/L — ABNORMAL LOW (ref 1.13–1.30)
Chloride: 106 mmol/L (ref 101–111)
Glucose, Bld: 381 mg/dL — ABNORMAL HIGH (ref 65–99)
HEMATOCRIT: 44 % (ref 36.0–46.0)
Hemoglobin: 15 g/dL (ref 12.0–15.0)
Potassium: 3.9 mmol/L (ref 3.5–5.1)
Sodium: 137 mmol/L (ref 135–145)
TCO2: 21 mmol/L (ref 0–100)

## 2015-04-28 LAB — GLUCOSE, CAPILLARY: Glucose-Capillary: 216 mg/dL — ABNORMAL HIGH (ref 65–99)

## 2015-04-28 LAB — PROTIME-INR
INR: 1 (ref 0.00–1.49)
Prothrombin Time: 13.4 seconds (ref 11.6–15.2)

## 2015-04-28 LAB — I-STAT TROPONIN, ED: Troponin i, poc: 0 ng/mL (ref 0.00–0.08)

## 2015-04-28 LAB — ETHANOL: Alcohol, Ethyl (B): <5 mg/dL (ref <5)

## 2015-04-28 MED ORDER — ALTEPLASE (STROKE) FULL DOSE INFUSION
66.0000 mg | Freq: Once | INTRAVENOUS | Status: DC
  Filled 2015-04-28: qty 66

## 2015-04-28 MED ORDER — PANTOPRAZOLE SODIUM 40 MG PO TBEC
40.0000 mg | DELAYED_RELEASE_TABLET | Freq: Every day | ORAL | Status: DC
  Administered 2015-04-30 – 2015-05-06 (×6): 40 mg via ORAL
  Filled 2015-04-28 (×7): qty 1

## 2015-04-28 MED ORDER — SENNOSIDES-DOCUSATE SODIUM 8.6-50 MG PO TABS
1.0000 | ORAL_TABLET | Freq: Every evening | ORAL | Status: DC | PRN
  Administered 2015-05-02: 1 via ORAL
  Filled 2015-04-28: qty 1

## 2015-04-28 MED ORDER — SODIUM CHLORIDE 0.9 % IV BOLUS (SEPSIS)
250.0000 mL | Freq: Once | INTRAVENOUS | Status: AC
  Administered 2015-04-28: 250 mL via INTRAVENOUS

## 2015-04-28 MED ORDER — ATORVASTATIN CALCIUM 10 MG PO TABS
10.0000 mg | ORAL_TABLET | Freq: Every day | ORAL | Status: DC
  Administered 2015-04-29: 10 mg via ORAL
  Filled 2015-04-28 (×2): qty 1

## 2015-04-28 MED ORDER — AMLODIPINE BESYLATE 5 MG PO TABS
5.0000 mg | ORAL_TABLET | Freq: Every day | ORAL | Status: DC
  Administered 2015-04-29 – 2015-05-06 (×8): 5 mg via ORAL
  Filled 2015-04-28 (×9): qty 1

## 2015-04-28 MED ORDER — NITROGLYCERIN 0.4 MG SL SUBL: 0.4000 mg | SUBLINGUAL_TABLET | SUBLINGUAL | Status: DC | PRN

## 2015-04-28 MED ORDER — SODIUM CHLORIDE 0.9 % IV SOLN
INTRAVENOUS | Status: DC
  Administered 2015-04-28 – 2015-04-29 (×2): via INTRAVENOUS

## 2015-04-28 MED ORDER — ASPIRIN 325 MG PO TABS
325.0000 mg | ORAL_TABLET | Freq: Every day | ORAL | Status: DC
  Administered 2015-04-29: 325 mg via ORAL
  Filled 2015-04-28 (×2): qty 1

## 2015-04-28 MED ORDER — METOPROLOL SUCCINATE ER 25 MG PO TB24
50.0000 mg | ORAL_TABLET | Freq: Every day | ORAL | Status: DC
  Administered 2015-04-29 – 2015-05-06 (×6): 50 mg via ORAL
  Filled 2015-04-28 (×9): qty 2

## 2015-04-28 MED ORDER — ASPIRIN 300 MG RE SUPP
300.0000 mg | Freq: Every day | RECTAL | Status: DC
  Administered 2015-04-28: 300 mg via RECTAL
  Filled 2015-04-28: qty 1

## 2015-04-28 MED ORDER — STROKE: EARLY STAGES OF RECOVERY BOOK
Freq: Once | Status: AC
  Administered 2015-04-28: 19:00:00

## 2015-04-28 MED ORDER — INSULIN ASPART 100 UNIT/ML ~~LOC~~ SOLN
0.0000 [IU] | Freq: Three times a day (TID) | SUBCUTANEOUS | Status: DC
  Administered 2015-04-29 (×3): 3 [IU] via SUBCUTANEOUS
  Administered 2015-04-30: 2 [IU] via SUBCUTANEOUS
  Administered 2015-04-30: 3 [IU] via SUBCUTANEOUS
  Administered 2015-04-30: 2 [IU] via SUBCUTANEOUS
  Administered 2015-05-01: 3 [IU] via SUBCUTANEOUS
  Administered 2015-05-01 (×2): 2 [IU] via SUBCUTANEOUS
  Administered 2015-05-02: 3 [IU] via SUBCUTANEOUS
  Administered 2015-05-02: 2 [IU] via SUBCUTANEOUS
  Administered 2015-05-02: 3 [IU] via SUBCUTANEOUS
  Administered 2015-05-03: 2 [IU] via SUBCUTANEOUS
  Administered 2015-05-03: 5 [IU] via SUBCUTANEOUS
  Administered 2015-05-03: 2 [IU] via SUBCUTANEOUS
  Administered 2015-05-04: 3 [IU] via SUBCUTANEOUS
  Administered 2015-05-04 – 2015-05-06 (×5): 2 [IU] via SUBCUTANEOUS
  Administered 2015-05-06: 3 [IU] via SUBCUTANEOUS
  Administered 2015-05-06: 1 [IU] via SUBCUTANEOUS

## 2015-04-28 MED ORDER — HEPARIN SODIUM (PORCINE) 5000 UNIT/ML IJ SOLN
5000.0000 [IU] | Freq: Three times a day (TID) | INTRAMUSCULAR | Status: DC
  Administered 2015-04-28 – 2015-05-06 (×24): 5000 [IU] via SUBCUTANEOUS
  Filled 2015-04-28 (×24): qty 1

## 2015-04-28 NOTE — ED Notes (Addendum)
Pt arrives from home via [facility] as Code Stroke, [place] 1230. MS reports right sided deficits and slurred speech.  EMS reports improvement to speech and RUE on truck.  EMS reports pt received about 250 mL NS on truck.  AOx4 on arrival.

## 2015-04-28 NOTE — ED Notes (Signed)
Dr. Nadyne Coombes at bedside.

## 2015-04-28 NOTE — ED Provider Notes (Signed)
CSN: [id number]     Arrival date & time 04/28/15  1410 History   First MD Initiated Contact with Patient 04/28/15 1413     Chief Complaint  Patient presents with  . Code Stroke    An emergency department physician performed an initial assessment on this suspected stroke patient at 1413. (Consider location/radiation/quality/duration/timing/severity/associated sxs/prior Treatment) HPI Comments: Patient reports right-sided weakness and pain to the right arm beginning sometime last night. She changed her story but has stated it began at 12:30 AM and also 2 AM. When family arrived home around 74 AM, she was not acting right. EMS was called and the stroke was called out since last known normal at that time was around 12:30 PM.  Patient is a 69 y.o. female presenting with neurologic complaint. The history is provided by the patient, the EMS personnel and a relative.  Neurologic Problem This is a new problem. The current episode started 12 to 24 hours ago. The problem occurs constantly. The problem has not changed since onset.Pertinent negatives include no abdominal pain and no shortness of breath. Nothing aggravates the symptoms. Nothing relieves the symptoms. She has tried nothing for the symptoms.    Past Medical History  Diagnosis Date  . CHF (congestive heart failure)    Past Surgical History  Procedure Laterality Date  . Cardiac surgery      Cath without stent   No family history on file. Social History  Substance Use Topics  . Smoking status: Current Every Day Smoker    Types: Cigarettes  . Smokeless tobacco: Not on file  . Alcohol Use: No   OB History    No data available     Review of Systems  Constitutional: Negative for fever.  Respiratory: Negative for cough and shortness of breath.   Gastrointestinal: Negative for vomiting and abdominal pain.  All other systems reviewed and are negative.     Allergies  Review of patient's allergies indicates no known  allergies.  Home Medications   Prior to Admission medications   Medication Sig Start Date End Date Taking? Authorizing Provider  amLODipine (NORVASC) 5 MG tablet Take 5 mg by mouth daily.    Historical Provider, MD  aspirin EC 81 MG EC tablet Take 1 tablet (81 mg total) by mouth daily. 11/13/12   Charolette Forward, MD  atorvastatin (LIPITOR) 10 MG tablet Take 1 tablet (10 mg total) by mouth daily. 11/13/12   Charolette Forward, MD  nitroGLYCERIN (NITROSTAT) 0.4 MG SL tablet Place 0.4 mg under the tongue every 5 (five) minutes as needed for chest pain.    Historical Provider, MD  pantoprazole (PROTONIX) 40 MG tablet Take 1 tablet (40 mg total) by mouth daily at 6 (six) AM. 11/14/12   Charolette Forward, MD   BP 151/77 mmHg  Pulse 81  Resp 14  Ht 5\' 7"  (1.702 m)  Wt 160 lb 11.5 oz (72.9 kg)  BMI 25.17 kg/m2  SpO2 99% Physical Exam  Constitutional: She is oriented to person, place, and time. She appears well-developed and well-nourished. No distress.  HENT:  Head: Normocephalic and atraumatic.  Mouth/Throat: Oropharynx is clear and moist.  Eyes: EOM are normal. Pupils are equal, round, and reactive to light.  Neck: Normal range of motion. Neck supple.  Cardiovascular: Normal rate and regular rhythm.  Exam reveals no friction rub.   No murmur heard. Pulmonary/Chest: Effort normal and breath sounds normal. No respiratory distress. She has no wheezes. She has no rales.  Abdominal: Soft. She exhibits  no distension. There is no tenderness. There is no rebound.  Musculoskeletal: Normal range of motion. She exhibits no edema.  Neurological: She is alert and oriented to person, place, and time. A cranial nerve deficit (dysarthria) is present. No sensory deficit. She exhibits normal muscle tone. GCS eye subscore is 4. GCS verbal subscore is 5. GCS motor subscore is 6.  Skin: She is not diaphoretic.  Nursing note and vitals reviewed.   ED Course  Procedures (including critical care time) Labs Review Labs  Reviewed  I-STAT CHEM 8, ED - Abnormal; Notable for the following:    Glucose, Bld 381 (*)    Calcium, Ion 1.12 (*)    All other components within normal limits  PROTIME-INR  APTT  CBC  DIFFERENTIAL  ETHANOL  COMPREHENSIVE METABOLIC PANEL  URINE RAPID DRUG SCREEN, HOSP PERFORMED  URINALYSIS, ROUTINE W REFLEX MICROSCOPIC (NOT AT [facility])  I-STAT TROPOININ, ED    Imaging Review Ct Head Wo Contrast  04/28/2015  CLINICAL DATA:  Right-sided weakness and altered speech beginning today. Initial encounter. EXAM: CT HEAD WITHOUT CONTRAST TECHNIQUE: Contiguous axial images were obtained from the base of the skull through the vertex without intravenous contrast. COMPARISON:  Head CT scan 12/30/2007. FINDINGS: There is some hypoattenuation in the subcortical and periventricular deep white matter consistent chronic microvascular ischemic change. No evidence of acute abnormality including infarct, hemorrhage, mass lesion, mass effect, midline shift or abnormal extra-axial fluid collection is seen. No hydrocephalus or pneumocephalus. The calvarium is intact. Imaged paranasal sinuses and mastoid air cells are clear. IMPRESSION: No acute abnormality. Electronically Signed   By: Inge Rise M.D.   On: 04/28/2015 14:34   I have personally reviewed and evaluated these images and lab results as part of my medical decision-making.   EKG Interpretation   Date/Time:  Monday April 28 2015 14:33:12 EDT Ventricular Rate:  91 PR Interval:  139 QRS Duration: 104 QT Interval:  455 QTC Calculation: 560 R Axis:   -42 Text Interpretation:  Sinus rhythm Left axis deviation Low voltage,  extremity and precordial leads Abnormal R-wave progression, late  transition Borderline repolarization abnormality Prolonged QT interval No  significant change since last tracing Confirmed by Mingo Amber  MD, Nekoda Chock  ([id number]) on 04/28/2015 2:48:51 PM      MDM   Final diagnoses:  Dysarthria  Right sided weakness     69 year old female here with right-sided deficits and dysarthria. Initially was a code stroke, last known normal was about 2 hours prior to arrival however further history revealed an unclear time of last known normal. Patient reports it could've been as early as 12:30 AM, but also reportedly could be to a.m. There is no one available to speak to her condition between 11 PM and 11 AM.  As a team we decided to use last known normal as last night, thus take care of the TPA window. Neurology recommends admission. Dr. Terrence Dupont admitting.     Evelina Bucy, MD 04/28/15 [phone]

## 2015-04-28 NOTE — ED Notes (Signed)
Pt reports LKW before bedtime last night, approx 1230am.  New family member at bedside reports pt "not acting right" at 10am today.  Dr. Doy Mince made aware.

## 2015-04-28 NOTE — ED Notes (Signed)
Admitting MD at bedside.

## 2015-04-28 NOTE — H&P (Signed)
[name] is an 69 y.o. female.   Chief Complaint: Right arm and leg weakness associated with slurred speech and confusion HPI: Patient is 69 year old female with past medical history significant for mild coronary artery disease, hypertension, diabetes mellitus, hyperlipidemia, history of tobacco abuse in the past, history of congestive heart failure secondary to desert LV systolic function, came to the ER by EMS as patient was noted by family to have right-sided weakness and slurred speech and confusion. Patient states she had similar symptoms last night around 12:30 AM did not seek any medical attention. Denies any chest pain shortness of breath. Denies palpitation lightheadedness or syncope. Denies headache. She was seen earlier today by neuro in consultation and was felt not to be the candidate for thrombolytic therapy as patient was out of window.  Past Medical History  Diagnosis Date  . CHF (congestive heart failure) ([place])   . Diabetes ([place])   . Hypertension     Past Surgical History  Procedure Laterality Date  . Cardiac surgery      Cath without stent    Family History  Problem Relation Age of Onset  . Hypertension Mother   . Heart failure Mother   . Hyperlipidemia Mother    Social History:  reports that she has been smoking Cigarettes.  She does not have any smokeless tobacco history on file. She reports that she does not drink alcohol or use illicit drugs.  Allergies: No Known Allergies   (Not in a hospital admission)  Results for orders placed or performed during the hospital encounter of 04/28/15 (from the past 48 hour(s))  Ethanol     Status: None   Collection Time: 04/28/15  2:14 PM  Result Value Ref Range   Alcohol, Ethyl (B) <5 <5 mg/dL    Comment:        LOWEST DETECTABLE LIMIT FOR SERUM ALCOHOL IS 5 mg/dL FOR MEDICAL PURPOSES ONLY   Protime-INR     Status: None   Collection Time: 04/28/15  2:14 PM  Result Value Ref Range   Prothrombin Time 13.4 11.6 -  15.2 seconds   INR 1.00 0.00 - 1.49  APTT     Status: None   Collection Time: 04/28/15  2:14 PM  Result Value Ref Range   aPTT 26 24 - 37 seconds  CBC     Status: None   Collection Time: 04/28/15  2:14 PM  Result Value Ref Range   WBC 9.4 4.0 - 10.5 K/uL   RBC 4.81 3.87 - 5.11 MIL/uL   Hemoglobin 13.4 12.0 - 15.0 g/dL   HCT 41.0 36.0 - 46.0 %   MCV 85.2 78.0 - 100.0 fL   MCH 27.9 26.0 - 34.0 pg   MCHC 32.7 30.0 - 36.0 g/dL   RDW 12.8 11.5 - 15.5 %   Platelets 210 150 - 400 K/uL  Differential     Status: None   Collection Time: 04/28/15  2:14 PM  Result Value Ref Range   Neutrophils Relative % 65 %   Neutro Abs 6.1 1.7 - 7.7 K/uL   Lymphocytes Relative 27 %   Lymphs Abs 2.5 0.7 - 4.0 K/uL   Monocytes Relative 7 %   Monocytes Absolute 0.7 0.1 - 1.0 K/uL   Eosinophils Relative 1 %   Eosinophils Absolute 0.1 0.0 - 0.7 K/uL   Basophils Relative 0 %   Basophils Absolute 0.0 0.0 - 0.1 K/uL  Comprehensive metabolic panel     Status: Abnormal   Collection  Time: 04/28/15  2:14 PM  Result Value Ref Range   Sodium 139 135 - 145 mmol/L   Potassium 4.0 3.5 - 5.1 mmol/L   Chloride 106 101 - 111 mmol/L   CO2 21 (L) 22 - 32 mmol/L   Glucose, Bld 380 (H) 65 - 99 mg/dL   BUN 10 6 - 20 mg/dL   Creatinine, Ser 0.97 0.44 - 1.00 mg/dL   Calcium 9.3 8.9 - 10.3 mg/dL   Total Protein 6.3 (L) 6.5 - 8.1 g/dL   Albumin 3.9 3.5 - 5.0 g/dL   AST 16 15 - 41 U/L   ALT 16 14 - 54 U/L   Alkaline Phosphatase 73 38 - 126 U/L   Total Bilirubin 0.6 0.3 - 1.2 mg/dL   GFR calc non Af Amer 58 (L) >60 mL/min   GFR calc Af Amer >60 >60 mL/min    Comment: (NOTE) The eGFR has been calculated using the CKD EPI equation. This calculation has not been validated in all clinical situations. eGFR's persistently <60 mL/min signify possible Chronic Kidney Disease.    Anion gap 12 5 - 15  I-stat troponin, ED (not at [facility], [facility])     Status: None   Collection Time: 04/28/15  2:18 PM  Result Value Ref Range    Troponin i, poc 0.00 0.00 - 0.08 ng/mL   Comment 3            Comment: Due to the release kinetics of cTnI, a negative result within the first hours of the onset of symptoms does not rule out myocardial infarction with certainty. If myocardial infarction is still suspected, repeat the test at appropriate intervals.   I-Stat Chem 8, ED  (not at [facility], [facility])     Status: Abnormal   Collection Time: 04/28/15  2:20 PM  Result Value Ref Range   Sodium 137 135 - 145 mmol/L   Potassium 3.9 3.5 - 5.1 mmol/L   Chloride 106 101 - 111 mmol/L   BUN 12 6 - 20 mg/dL   Creatinine, Ser 0.90 0.44 - 1.00 mg/dL   Glucose, Bld 381 (H) 65 - 99 mg/dL   Calcium, Ion 1.12 (L) 1.13 - 1.30 mmol/L   TCO2 21 0 - 100 mmol/L   Hemoglobin 15.0 12.0 - 15.0 g/dL   HCT 44.0 36.0 - 46.0 %   Ct Head Wo Contrast  04/28/2015  CLINICAL DATA:  Right-sided weakness and altered speech beginning today. Initial encounter. EXAM: CT HEAD WITHOUT CONTRAST TECHNIQUE: Contiguous axial images were obtained from the base of the skull through the vertex without intravenous contrast. COMPARISON:  Head CT scan 12/30/2007. FINDINGS: There is some hypoattenuation in the subcortical and periventricular deep white matter consistent chronic microvascular ischemic change. No evidence of acute abnormality including infarct, hemorrhage, mass lesion, mass effect, midline shift or abnormal extra-axial fluid collection is seen. No hydrocephalus or pneumocephalus. The calvarium is intact. Imaged paranasal sinuses and mastoid air cells are clear. IMPRESSION: No acute abnormality. Electronically Signed   By: Inge Rise M.D.   On: 04/28/2015 14:34    Review of Systems  Constitutional: Positive for malaise/fatigue.  Eyes: Negative for double vision and photophobia.  Respiratory: Negative for shortness of breath.   Cardiovascular: Negative for chest pain, palpitations and orthopnea.  Gastrointestinal: Negative for nausea, vomiting and abdominal pain.   Neurological: Positive for dizziness, speech change, focal weakness and weakness. Negative for seizures, loss of consciousness and headaches.    Blood pressure 154/97, pulse 78, resp. rate  11, height 5' 7" (1.702 m), weight 72.9 kg (160 lb 11.5 oz), SpO2 97 %. Physical Exam  Constitutional: She is oriented to person, place, and time.  HENT:  Head: Normocephalic and atraumatic.  Eyes: Conjunctivae are normal. Left eye exhibits no discharge. No scleral icterus.  Neck: Normal range of motion. Neck supple. No JVD present. No tracheal deviation present. No thyromegaly present.  Cardiovascular: Normal rate and regular rhythm.   Murmur (Soft systolic murmur noted no S3 gallop) heard. Respiratory: Effort normal and breath sounds normal. No respiratory distress. She has no wheezes. She has no rales.  GI: Soft. Bowel sounds are normal. She exhibits no distension. There is no tenderness. There is no rebound.  Musculoskeletal: She exhibits no edema or tenderness.  Neurological: She is alert and oriented to person, place, and time.  Motor 4 over 5 in right upper and lower extremity and 5 over 5 in left upper and lower extremity Plantar's downgoing     Assessment/Plan Probable left CVA with right paresis Mild CAD Hypertension Diabetes mellitus History of congestive heart failure secondary to preserved LV systolic function History of tobacco abuse Hyperlipidemia Plan As per orders   Charolette Forward 04/28/2015, 4:41 PM

## 2015-04-28 NOTE — Consult Note (Signed)
Referring MD --Mingo Amber    Chief Complaint: right sided weakness and slurred speech  HPI:                                                                                                                                         [name] is an 69 y.o. female with known HTN, DM and CHF.  Patient was well up until 1230 last night. She states at that time she noted her right arm felt weak.  Her niece saw her sat some point this AM and she was not acting right and her friend dropped by at 28 AM and noted she was weak on the right and slurring her speech.   Patient was brought to ED via code stroke.  NIHSS4.   Date last known well: Date: 04/27/2015 Time last known well: Time: 00:30 tPA Given: No: out of window Modified Rankin: Rankin Score=0   Past Medical History  Diagnosis Date  . CHF (congestive heart failure) ([place])   . Diabetes ([place])   . Hypertension     Past Surgical History  Procedure Laterality Date  . Cardiac surgery      Cath without stent    Family History  Problem Relation Age of Onset  . Hypertension Mother   . Heart failure Mother   . Hyperlipidemia Mother    Social History:  reports that she has been smoking Cigarettes.  She does not have any smokeless tobacco history on file. She reports that she does not drink alcohol or use illicit drugs.  Allergies: No Known Allergies  Medications:                                                                                                                           Current Facility-Administered Medications  Medication Dose Route Frequency Provider Last Rate Last Dose  . alteplase (ACTIVASE) 1 mg/mL infusion 66 mg  66 mg Intravenous Once [name], MD       Current Outpatient Prescriptions  Medication Sig Dispense Refill  . amLODipine (NORVASC) 5 MG tablet Take 5 mg by mouth daily.    Marland Kitchen aspirin EC 81 MG EC tablet Take 1 tablet (81 mg total) by mouth daily. 1 tablet 30  . atorvastatin (LIPITOR) 10 MG tablet Take 1  tablet (10 mg total) by mouth daily. 30 tablet 3  . nitroGLYCERIN (NITROSTAT)  0.4 MG SL tablet Place 0.4 mg under the tongue every 5 (five) minutes as needed for chest pain.    . pantoprazole (PROTONIX) 40 MG tablet Take 1 tablet (40 mg total) by mouth daily at 6 (six) AM. 30 tablet 3     ROS:                                                                                                                                       History obtained from the patient  General ROS: negative for - chills, fatigue, fever, night sweats, weight gain or weight loss Psychological ROS: negative for - behavioral disorder, hallucinations, memory difficulties, mood swings or suicidal ideation Ophthalmic ROS: negative for - blurry vision, double vision, eye pain or loss of vision ENT ROS: negative for - epistaxis, nasal discharge, oral lesions, sore throat, tinnitus or vertigo Allergy and Immunology ROS: negative for - hives or itchy/watery eyes Hematological and Lymphatic ROS: negative for - bleeding problems, bruising or swollen lymph nodes Endocrine ROS: negative for - galactorrhea, hair pattern changes, polydipsia/polyuria or temperature intolerance Respiratory ROS: negative for - cough, hemoptysis, shortness of breath or wheezing Cardiovascular ROS: negative for - chest pain, dyspnea on exertion, edema or irregular heartbeat Gastrointestinal ROS: negative for - abdominal pain, diarrhea, hematemesis, nausea/vomiting or stool incontinence Genito-Urinary ROS: negative for - dysuria, hematuria, incontinence or urinary frequency/urgency Musculoskeletal ROS: negative for - joint swelling or muscular weakness Neurological ROS: as noted in HPI Dermatological ROS: negative for rash and skin lesion changes  Neurologic Examination:                                                                                                      Blood pressure 151/77, pulse 81, resp. rate 14, height 5\' 7"  (1.702 m), weight 72.9 kg  (160 lb 11.5 oz), SpO2 99 %.  HEENT-  Normocephalic, no lesions, without obvious abnormality.  Normal external eye and conjunctiva.  Normal TM's bilaterally.  Normal auditory canals and external ears. Normal external nose, mucus membranes and septum.  Normal pharynx. Cardiovascular- S1, S2 normal, pulses palpable throughout   Lungs- chest clear, no wheezing, rales, normal symmetric air entry Abdomen- normal findings: bowel sounds normal Extremities- no edema Lymph-no adenopathy palpable Musculoskeletal-no joint tenderness, deformity or swelling Skin-warm and dry, no hyperpigmentation, vitiligo.  Skin changes on earlobes bilaterally  Neurological Examination Mental Status: Alert, oriented, thought content appropriate.  Light agitation.  Speech dysarthric.  Difficulty with word finding at times.  Able  to follow 3 step commands without difficulty. Cranial Nerves: II: Discs flat bilaterally; Visual fields grossly normal, pupils equal, round, reactive to light and accommodation III,IV, VI: ptosis not present, extra-ocular motions intact bilaterally V,VII: smile symmetric, facial light touch sensation decreased on the right VIII: hearing normal bilaterally IX,X: uvula rises symmetrically XI: bilateral shoulder shrug XII: midline tongue extension Motor: Right : Upper extremity   5/5    Left:     Upper extremity   5/5  Lower extremity   3/5     Lower extremity   5/5 Tone and bulk:normal tone throughout; no atrophy noted Sensory: Pinprick and light touch decreased on the right arm and leg Deep Tendon Reflexes: 2+ and symmetric throughout Plantars: Right: downgoing   Left: downgoing Cerebellar: normal finger-to-nose, and normal heel-to-shin test on the left and could not perform heel to shin testing on the right due to strength Gait: not tested due to safety concerns   Lab Results: Basic Metabolic Panel:  Recent Labs Lab 04/28/15 1414 04/28/15 1420  NA 139 137  K 4.0 3.9  CL 106 106   CO2 21*  --   GLUCOSE 380* 381*  BUN 10 12  CREATININE 0.97 0.90  CALCIUM 9.3  --     Liver Function Tests:  Recent Labs Lab 04/28/15 1414  AST 16  ALT 16  ALKPHOS 73  BILITOT 0.6  PROT 6.3*  ALBUMIN 3.9   No results for input(s): LIPASE, AMYLASE in the last 168 hours. No results for input(s): AMMONIA in the last 168 hours.  CBC:  Recent Labs Lab 04/28/15 1414 04/28/15 1420  WBC 9.4  --   NEUTROABS 6.1  --   HGB 13.4 15.0  HCT 41.0 44.0  MCV 85.2  --   PLT 210  --     Cardiac Enzymes: No results for input(s): CKTOTAL, CKMB, CKMBINDEX, TROPONINI in the last 168 hours.  Lipid Panel: No results for input(s): CHOL, TRIG, HDL, CHOLHDL, VLDL, LDLCALC in the last 168 hours.  CBG: No results for input(s): GLUCAP in the last 168 hours.  Microbiology: Results for orders placed or performed during the hospital encounter of 11/12/12  MRSA PCR Screening     Status: None   Collection Time: 11/12/12  8:36 PM  Result Value Ref Range Status   MRSA by PCR NEGATIVE NEGATIVE Final    Comment:        The GeneXpert MRSA Assay (FDA approved for NASAL specimens only), is one component of a comprehensive MRSA colonization surveillance program. It is not intended to diagnose MRSA infection nor to guide or monitor treatment for MRSA infections.  Urine culture     Status: None   Collection Time: 11/13/12  6:04 AM  Result Value Ref Range Status   Specimen Description URINE, CLEAN CATCH  Final   Special Requests NONE  Final   Culture  Setup Time 11/13/2012 14:12  Final   Colony Count >=100,000 COLONIES/ML  Final   Culture ESCHERICHIA COLI  Final   Report Status 11/14/2012 FINAL  Final   Organism ID, Bacteria ESCHERICHIA COLI  Final      Susceptibility   Escherichia coli - MIC*    AMPICILLIN >=32 RESISTANT Resistant     CEFAZOLIN <=4 SENSITIVE Sensitive     CEFTRIAXONE <=1 SENSITIVE Sensitive     CIPROFLOXACIN <=0.25 SENSITIVE Sensitive     GENTAMICIN <=1 SENSITIVE  Sensitive     LEVOFLOXACIN <=0.12 SENSITIVE Sensitive     NITROFURANTOIN 32 SENSITIVE Sensitive  TOBRAMYCIN <=1 SENSITIVE Sensitive     TRIMETH/SULFA <=20 SENSITIVE Sensitive     PIP/TAZO <=4 SENSITIVE Sensitive     * ESCHERICHIA COLI    Coagulation Studies:  Recent Labs  04/28/15 1414  LABPROT 13.4  INR 1.00    Imaging: Ct Head Wo Contrast  04/28/2015  CLINICAL DATA:  Right-sided weakness and altered speech beginning today. Initial encounter. EXAM: CT HEAD WITHOUT CONTRAST TECHNIQUE: Contiguous axial images were obtained from the base of the skull through the vertex without intravenous contrast. COMPARISON:  Head CT scan 12/30/2007. FINDINGS: There is some hypoattenuation in the subcortical and periventricular deep white matter consistent chronic microvascular ischemic change. No evidence of acute abnormality including infarct, hemorrhage, mass lesion, mass effect, midline shift or abnormal extra-axial fluid collection is seen. No hydrocephalus or pneumocephalus. The calvarium is intact. Imaged paranasal sinuses and mastoid air cells are clear. IMPRESSION: No acute abnormality. Electronically Signed   By: Inge Rise M.D.   On: 04/28/2015 14:34    Etta Quill PA-C [facility] [phone]  04/28/2015, 2:58 PM   Patient seen and examined.  Clinical course and management discussed.  Necessary edits performed.  I agree with the above.  Assessment and plan of care developed and discussed below   Assessment: 69 y.o. female presenting with complaints of right sided numbness and weakness.  Symptoms improved.  Patient outside time window as well.  On antiplatelet therapy at home.  Head CT personally reviewed and shows no acute changes.  Concern remains for acute infarct.  Further work up recommended.    Stroke Risk Factors - diabetes mellitus and hypertension   Plan: 1. HgbA1c, fasting lipid panel 2. MRI, MRA  of the brain without contrast 3. PT consult, OT consult,  Speech consult 4. Echocardiogram 5. Carotid dopplers 6. Prophylactic therapy-Antiplatelet med: Aspirin - dose 325mg  daily 7. NPO until RN stroke swallow screen 8. Telemetry monitoring 9. Frequent neuro checks    [name], MD [facility] [phone]  04/28/2015  3:56 PM

## 2015-04-28 NOTE — Code Documentation (Signed)
69yo female arriving to [facility] via [place] at 1410.  Patient was with a friend when she was found to have right sided weakness and slurred speech.  They walked over to another house to call 911, and the patient had to be carried.  EMS assessed her symptoms to be improving.  CBG 390 and BP 150/110 en route.  Patient with h/o DM and HTN.  Stroke team at the bedside on arrival.  Labs drawn and patient cleared by Dr. Vanita Panda.  Patient to CT.  NIHSS 4, see documentation for details and code stroke times.  Dr. Doy Mince at the bedside.  Plan of care discussed with patient and order for tPA.  Pharmacist at the bedside and order placed for tPA.  While preparing for tPA patient reporting that she noticed symptoms early this morning when she woke up around 0200.  She reports noting changes in her arm at that time.  Friend at the bedside notes that he last saw her well at 2300 last night and when he saw her at 1100 today she was not at her baseline.  tPA delivered to the bedside but not given as patient is now outside the window with LKW 04/27/2015 at 2300.  Patient to be admitted.  Bedside handoff with ED RN [place].

## 2015-04-28 NOTE — Progress Notes (Signed)
Pt arrived to 5M18 via stretcher.  In no apparent distress.  Son and friend at bedside.  VSS, telemetry applied and CCMD notified.  Will continue to monitor. Cori Razor, RN

## 2015-04-28 NOTE — ED Notes (Signed)
Patient undressed, in gown, on monitor, continuous pulse oximetry and blood pressure cuff; visitor at bedside 

## 2015-04-29 ENCOUNTER — Inpatient Hospital Stay (HOSPITAL_COMMUNITY): Payer: Medicare Other

## 2015-04-29 DIAGNOSIS — E785 Hyperlipidemia, unspecified: Secondary | ICD-10-CM

## 2015-04-29 DIAGNOSIS — I639 Cerebral infarction, unspecified: Secondary | ICD-10-CM

## 2015-04-29 DIAGNOSIS — E1159 Type 2 diabetes mellitus with other circulatory complications: Secondary | ICD-10-CM

## 2015-04-29 DIAGNOSIS — I63322 Cerebral infarction due to thrombosis of left anterior cerebral artery: Secondary | ICD-10-CM

## 2015-04-29 LAB — URINALYSIS, ROUTINE W REFLEX MICROSCOPIC
Bilirubin Urine: NEGATIVE
GLUCOSE, UA: 500 mg/dL — AB
Ketones, ur: NEGATIVE mg/dL
Nitrite: NEGATIVE
PH: 5 (ref 5.0–8.0)
Protein, ur: NEGATIVE mg/dL
SPECIFIC GRAVITY, URINE: 1.013 (ref 1.005–1.030)
Urobilinogen, UA: 0.2 mg/dL (ref 0.0–1.0)

## 2015-04-29 LAB — GLUCOSE, CAPILLARY
GLUCOSE-CAPILLARY: 173 mg/dL — AB (ref 65–99)
GLUCOSE-CAPILLARY: 204 mg/dL — AB (ref 65–99)
GLUCOSE-CAPILLARY: 226 mg/dL — AB (ref 65–99)
GLUCOSE-CAPILLARY: 227 mg/dL — AB (ref 65–99)
Glucose-Capillary: 244 mg/dL — ABNORMAL HIGH (ref 65–99)
Glucose-Capillary: 217 mg/dL — ABNORMAL HIGH (ref 65–99)

## 2015-04-29 LAB — RAPID URINE DRUG SCREEN, HOSP PERFORMED
Amphetamines: NOT DETECTED
BENZODIAZEPINES: NOT DETECTED
Barbiturates: NOT DETECTED
COCAINE: NOT DETECTED
OPIATES: NOT DETECTED
Tetrahydrocannabinol: NOT DETECTED

## 2015-04-29 LAB — URINE MICROSCOPIC-ADD ON

## 2015-04-29 LAB — HEMOGLOBIN A1C
HEMOGLOBIN A1C: 11.6 % — AB (ref 4.8–5.6)
MEAN PLASMA GLUCOSE: 286 mg/dL

## 2015-04-29 LAB — LIPID PANEL
CHOL/HDL RATIO: 4.8 ratio
Cholesterol: 212 mg/dL — ABNORMAL HIGH (ref 0–200)
HDL: 44 mg/dL (ref >40)
LDL CALC: 121 mg/dL — AB (ref 0–99)
Triglycerides: 236 mg/dL — ABNORMAL HIGH (ref <150)
VLDL: 47 mg/dL — AB (ref 0–40)

## 2015-04-29 MED ORDER — ATORVASTATIN CALCIUM 40 MG PO TABS
40.0000 mg | ORAL_TABLET | Freq: Every day | ORAL | Status: DC
  Administered 2015-04-30 – 2015-05-06 (×7): 40 mg via ORAL
  Filled 2015-04-29 (×7): qty 1

## 2015-04-29 MED ORDER — CLOPIDOGREL BISULFATE 75 MG PO TABS
75.0000 mg | ORAL_TABLET | Freq: Every day | ORAL | Status: DC
  Administered 2015-04-29 – 2015-05-06 (×8): 75 mg via ORAL
  Filled 2015-04-29 (×8): qty 1

## 2015-04-29 MED ORDER — INSULIN GLARGINE 100 UNIT/ML ~~LOC~~ SOLN
10.0000 [IU] | Freq: Every day | SUBCUTANEOUS | Status: DC
  Administered 2015-04-29: 10 [IU] via SUBCUTANEOUS
  Filled 2015-04-29 (×2): qty 0.1

## 2015-04-29 NOTE — Care Management Note (Deleted)
Case Management Note  Patient Details  Name: [name] MRN: [id number] Date of Birth: 03-27-1946  Subjective/Objective:                    Action/Plan:  Patient was admitted with CVA. Will continue to follow for discharge needs pending PT/OT evals and physician orders. Expected Discharge Date:                  Expected Discharge Plan:  [place]  In-House Referral:     Discharge planning Services     Post Acute Care Choice:    Choice offered to:     DME Arranged:    DME Agency:     HH Arranged:    [place] Agency:     Status of Service:  In process, will continue to follow  Medicare Important Message Given:    Date Medicare IM Given:    Medicare IM give by:    Date Additional Medicare IM Given:    Additional Medicare Important Message give by:     If discussed at [place] of Stay Meetings, dates discussed:    Additional Comments:  Rolm Baptise, RN 04/29/2015, 11:10 AM

## 2015-04-29 NOTE — Progress Notes (Signed)
*  PRELIMINARY RESULTS* Vascular Ultrasound Carotid Duplex (Doppler) has been completed.  Preliminary findings: Bilateral:  1-39% ICA stenosis.  Vertebral artery flow is antegrade.      Landry Mellow, RDMS, RVT  04/29/2015, 11:08 AM

## 2015-04-29 NOTE — Evaluation (Signed)
Clinical/Bedside Swallow Evaluation Patient Details  Name: [name] MRN: [id number] Date of Birth: 03-May-1946  Today's Date: 04/29/2015 Time: SLP Start Time (ACUTE ONLY): 1023 SLP Stop Time (ACUTE ONLY): 1041 SLP Time Calculation (min) (ACUTE ONLY): 18 min  Past Medical History:  Past Medical History  Diagnosis Date  . CHF (congestive heart failure) ([place])   . Diabetes ([place])   . Hypertension    Past Surgical History:  Past Surgical History  Procedure Laterality Date  . Cardiac surgery      Cath without stent   HPI:  [name] is a 69 y.o. female with known HTN, DM and CHF who presents with right sided weakness and slurred speech. MRI shows acute large left ACA territory infarct without hemorrhagic conversion.   Assessment / Plan / Recommendation Clinical Impression  Pt's oropharyngeal swallow appears grossly WFL, but is limited by current lethargy and slow mastication. Will initiate Dys 3 diet and thin liquids, but anticipate good prognosis for advancement as alertness improves.    Aspiration Risk  Mild    Diet Recommendation Dysphagia 3 (Mech soft);Thin   Medication Administration: Whole meds with liquid Compensations: Minimize environmental distractions;Slow rate;Small sips/bites    Other  Recommendations Oral Care Recommendations: Oral care BID   Follow Up Recommendations   tba    Frequency and Duration min 2x/week  2 weeks   Pertinent Vitals/Pain n/a    SLP Swallow Goals     Swallow Study Prior Functional Status       General Other Pertinent Information: Melissa MCCARN is a 69 y.o. female with known HTN, DM and CHF who presents with right sided weakness and slurred speech. MRI shows acute large left ACA territory infarct without hemorrhagic conversion. Type of Study: Bedside swallow evaluation Previous Swallow Assessment: none in chart Diet Prior to this Study: NPO Temperature Spikes Noted: No Respiratory Status: Room air History of Recent  Intubation: No Behavior/Cognition: Lethargic/Drowsy Self-Feeding Abilities: Able to feed self Patient Positioning: Upright in bed Baseline Vocal Quality: Low vocal intensity Volitional Cough: Cognitively unable to elicit    Oral/Motor/Sensory Function Overall Oral Motor/Sensory Function:  (difficult to assess, limited command following)   Ice Chips Ice chips: Not tested   Thin Liquid Thin Liquid: Within functional limits Presentation: Straw;Self Fed    Nectar Thick Nectar Thick Liquid: Not tested   Honey Thick Honey Thick Liquid: Not tested   Puree Puree: Within functional limits Presentation: Self Fed;Spoon   Solid    Solid: Within functional limits Presentation: Self Fed      [name], M.A. CCC-SLP [phone]  [name] 04/29/2015,10:49 AM

## 2015-04-29 NOTE — Evaluation (Signed)
Speech Language Pathology Evaluation Patient Details Name: [name] MRN: [id number] DOB: [birth year] Today's Date: 04/29/2015 Time: 8413-2440 SLP Time Calculation (min) (ACUTE ONLY): 18 min  Problem List:  Patient Active Problem List   Diagnosis Date Noted  . Left-sided cerebrovascular accident (CVA) ([place]) 04/28/2015   Past Medical History:  Past Medical History  Diagnosis Date  . CHF (congestive heart failure) ([place])   . Diabetes ([place])   . Hypertension    Past Surgical History:  Past Surgical History  Procedure Laterality Date  . Cardiac surgery      Cath without stent   HPI:  [name] is a 69 y.o. female with known HTN, DM and CHF who presents with right sided weakness and slurred speech. MRI shows acute large left ACA territory infarct without hemorrhagic conversion.   Assessment / Plan / Recommendation Clinical Impression  Pt's overall performance was limited by lethargy this morning, requiring Max prompting to maintain alertness. Pt was observed during a phone call, speaking at a slow rate and with mild dysarthria, but with fairly fluid speech; however, within structured tasks, including basic yes/no questions, pt provided minimal responses. Singe-step command following was limited during structured trials as well, yet improved performance was noted during functional self-feeding task. Pt will need SLP f/u with focus on differential diagnosis of cognitive-linguistic skills.    SLP Assessment  Patient needs continued Speech Lanaguage Pathology Services    Follow Up Recommendations  Inpatient Rehab    Frequency and Duration min 2x/week  2 weeks   Pertinent Vitals/Pain Pain Assessment: No/denies pain   SLP Goals  Patient/Family Stated Goal: none stated Potential to Achieve Goals (ACUTE ONLY): Fair Potential Considerations (ACUTE ONLY): Ability to learn/carryover information;Cooperation/participation level;Previous level of function  SLP Evaluation Prior  Functioning  Cognitive/Linguistic Baseline: Information not available  Lives With: Other (Comment) (granddaughter) Available Help at Discharge: Family   Cognition  Overall Cognitive Status: Difficult to assess Arousal/Alertness: Lethargic Orientation Level: Oriented to person;Oriented to place Attention: Sustained;Focused Focused Attention: Impaired Focused Attention Impairment: Verbal basic Sustained Attention: Impaired Sustained Attention Impairment: Verbal basic Comments: limited assessment due to lethargy    Comprehension  Auditory Comprehension Overall Auditory Comprehension: Impaired Yes/No Questions: Impaired Basic Biographical Questions: 0-25% accurate Commands: Impaired One Step Basic Commands: 50-74% accurate Interfering Components: Other (comment) (lethargy)    Expression Expression Primary Mode of Expression: Verbal Verbal Expression Overall Verbal Expression: Impaired Initiation: Impaired Level of Generative/Spontaneous Verbalization: Phrase Written Expression Dominant Hand: Right   Oral / Motor Oral Motor/Sensory Function Overall Oral Motor/Sensory Function:  (difficult to assess, limited command following) Motor Speech Overall Motor Speech: Impaired Respiration: Within functional limits Phonation: Low vocal intensity Articulation: Impaired Level of Impairment: Word Intelligibility: Intelligibility reduced Word: 50-74% accurate       Germain Osgood, M.A. CCC-SLP [phone]  Germain Osgood 04/29/2015, 10:58 AM

## 2015-04-29 NOTE — Progress Notes (Signed)
STROKE TEAM PROGRESS NOTE   HISTORY [name] is an 69 y.o. female with known HTN, DM and CHF. Patient was well up until 1230 last night (LKW 04/27/2015 at [facility]). She states at that time she noted her right arm felt weak. Her niece saw her at some point this AM and she was not acting right and her friend dropped by at 15 AM and noted she was weak on the right and slurring her speech. Patient was brought to ED via code stroke. NIHSS 4.  Modified Rankin: Rankin Score=0. Patient was not administered TPA secondary to delay in arrival. She was admitted for further evaluation and treatment.   SUBJECTIVE (INTERVAL HISTORY) Her grandson is at the bedside.  She just finished lunch. I talked with son later over the phone, updated situation, diagnosis and treatment plan. She still has right side hemiparesis, LE> UE.    OBJECTIVE Temp:  [97.3 F (36.3 C)-98.8 F (37.1 C)] 97.7 F (36.5 C) (10/25 1141) Pulse Rate:  [61-90] 88 (10/25 1141) Cardiac Rhythm:  [-] Normal sinus rhythm (10/25 1100) Resp:  [11-24] 18 (10/25 0957) BP: (141-175)/(77-97) 141/95 mmHg (10/25 1141) SpO2:  [96 %-100 %] 100 % (10/25 1141) Weight:  [72.9 kg (160 lb 11.5 oz)] 72.9 kg (160 lb 11.5 oz) (10/24 1425)  CBC:   Recent Labs Lab 04/28/15 1414 04/28/15 1420 04/28/15 2000  WBC 9.4  --  10.2  NEUTROABS 6.1  --   --   HGB 13.4 15.0 14.5  HCT 41.0 44.0 44.4  MCV 85.2  --  85.2  PLT 210  --  568    Basic Metabolic Panel:   Recent Labs Lab 04/28/15 1414 04/28/15 1420 04/28/15 2000  NA 139 137 141  K 4.0 3.9 3.7  CL 106 106 105  CO2 21*  --  28  GLUCOSE 380* 381* 221*  BUN 10 12 8   CREATININE 0.97 0.90 0.85  CALCIUM 9.3  --  9.9    Lipid Panel:     Component Value Date/Time   CHOL 212* 04/29/2015 0439   TRIG 236* 04/29/2015 0439   HDL 44 04/29/2015 0439   CHOLHDL 4.8 04/29/2015 0439   VLDL 47* 04/29/2015 0439   LDLCALC 121* 04/29/2015 0439   HgbA1c:  Lab Results  Component Value Date    HGBA1C 11.6* 04/28/2015   Urine Drug Screen: No results found for: LABOPIA, COCAINSCRNUR, LABBENZ, AMPHETMU, THCU, LABBARB    IMAGING I have personally reviewed the radiological images below and agree with the radiology interpretations.  Dg Chest 2 View 04/28/2015  No active cardiopulmonary disease.   Ct Head Wo Contrast 04/28/2015  No acute abnormality.   MRI HEAD 04/29/2015   Acute large LEFT anterior cerebral artery territory infarct without hemorrhagic conversion. Moderate chronic small vessel ischemic disease.   MRA HEAD 04/29/2015   Acute LEFT A2 occlusion compatible with thromboembolic disease. Moderate to high-grade stenosis RIGHT A2 segment.   CUS - Bilateral: 1-39% ICA stenosis. Vertebral artery flow is antegrade.  TTE - - Left ventricle: The cavity size was normal. Systolic function was normal. The estimated ejection fraction was in the range of 50% to 55%. Doppler parameters are consistent with abnormal left ventricular relaxation (grade 1 diastolic dysfunction). - Atrial septum: No defect or patent foramen ovale was identified. - Pericardium, extracardiac: A trivial pericardial effusion was identified. Features were not consistent with tamponade physiology. Impressions: - No cardiac source of embolism was identified, but cannot be ruled out on the basis of  this examination.   PHYSICAL EXAM  Temp:  [97.7 F (36.5 C)-98.8 F (37.1 C)] 98.8 F (37.1 C) (10/25 2135) Pulse Rate:  [61-88] 63 (10/25 2135) Resp:  [14-18] 18 (10/25 0957) BP: (139-175)/(75-96) 148/75 mmHg (10/25 2135) SpO2:  [94 %-100 %] 100 % (10/25 2135)  General - obesity, well developed, lethargic, mildly depressed.  Ophthalmologic - Fundi not visualized due to noncooperation.  Cardiovascular - Regular rate and rhythm.  Mental Status -  Level of arousal and orientation to place, and person were intact, but not to time. Language including expression, naming, repetition,  comprehension was assessed and found intact, but dysarthric.   Cranial Nerves II - XII - II - Visual field intact OU. III, IV, VI - Extraocular movements intact. V - Facial sensation intact bilaterally. VII - mild right facial droop. VIII - Hearing & vestibular intact bilaterally. X - Palate elevates symmetrically. XI - Chin turning & shoulder shrug intact bilaterally. XII - Tongue protrusion intact.  Motor Strength - The patient's strength was normal in all extremities except RUE proximal 4/5, distal 3/5, RLE 2/5 and pronator drift was present on the right.  Bulk was normal and fasciculations were absent.   Motor Tone - Muscle tone was assessed at the neck and appendages and was normal.  Reflexes - The patient's reflexes were 1+ in all extremities and she had no pathological reflexes.  Sensory - Light touch, temperature/pinprick were assessed and were symmetrical.    Coordination - The patient had normal movements in the hands and feet with no ataxia or dysmetria.  Tremor was absent.  Gait and Station - not tested.    ASSESSMENT/PLAN [name] is a 69 y.o. female with history of HTN, DM and CHF presenting with right arm and leg weakness associated with slurred speech and confusion. She did not receive IV t-PA due to delay in arrival.   Stroke:  Dominant  L ACA infarct in setting of A2 occlusion, possible embolic source  Resultant  Right hemiparesis, R facial weakness, dysarthria  MRI  L ACA infarct  MRA  L A2 occlusion, high grade stenosis R A2  Carotid Doppler  No significant stenosis   2D Echo  unremarkable  LDL 121  HgbA1c 11.6  Heparin 5000 units sq tid for VTE prophylaxis DIET DYS 3 Room service appropriate?: Yes; Fluid consistency:: Thin  aspirin 81 mg daily prior to admission, now on aspirin 325 mg daily. Given stroke on aspirin, change to plavix. Continue plavix on discharge.  Patient counseled to be compliant with her antithrombotic  medications  Recommend OP tele monitoring to look for atrial fibrillation as source of stroke  Ongoing aggressive stroke risk factor management  Therapy recommendations:  CIR  Disposition:  pending   Hypertension  Home meds: norvasc 5 and metoprolol 50mg   Stable  Permissive hypertension (OK if < 220/120) but gradually normalize in 5-7 days  Hyperlipidemia  Home meds:  lipitor 10, resumed in hospital  LDL 121, goal < 70  Increased lipitor to 40mg   Continue statin at discharge  Diabetes type II  HgbA1c 11.6, goal < 7.0  Uncontrolled  On lantus  SSI  Other Stroke Risk Factors  Advanced age  Cigarette smoker, advised to stop smoking  CAD, mild  Other Active Problems  CHF secondary to preserved LV systolic function  GERD on protonix  Hospital day # 1  Neurology will sign off. Please call with questions. Pt will follow up with Dr. Erlinda Hong at [facility] in about  2 months. Thanks for the consult.  Rosalin Hawking, MD PhD Stroke Neurology 04/29/2015 10:19 PM    To contact Stroke Continuity provider, please refer to [email or web address] After hours, contact General Neurology

## 2015-04-29 NOTE — Clinical Social Work Note (Signed)
CSW reviewed chart.  PT is recommending CIR at time of discharge.  CIR consult placed today for screening.  Patient is alert though only oriented to person/place.  CSW attempted to contact spouse, Jenny Reichmann [phone].  CSW left message requesting a return call to discuss disposition.  CSW currently awaiting a return call.    CSW will continue to follow and assist with disposition along side CIR.    Nonnie Done, LCSW [phone]  [facility] Licensed Clinical Social Worker

## 2015-04-29 NOTE — Progress Notes (Signed)
Echocardiogram 2D Echocardiogram has been performed.  Tresa Res 04/29/2015, 12:43 PM

## 2015-04-29 NOTE — Progress Notes (Signed)
Patient began vomiting, emesis appeared to be patient's food from dinner. Patient suctioned, oral care, returned to bed, head of bed elevated to 60 degrees.

## 2015-04-29 NOTE — Progress Notes (Signed)
Rehab Admissions Coordinator Note:  Patient was screened by Retta Diones for appropriateness for an Inpatient Acute Rehab Consult.  At this time, we are recommending Inpatient Rehab consult.  Jodell Cipro M 04/29/2015, 10:38 AM  I can be reached at [phone].

## 2015-04-29 NOTE — Progress Notes (Signed)
Subjective:  Awake continues to have slurred speech and drowsiness.  Motor strength in right upper and lower extremity appears to be stable.  Patient noncompliant with medications.  Objective:  Vital Signs in the last 24 hours: Temp:  [97.3 F (36.3 C)-98.8 F (37.1 C)] 97.7 F (36.5 C) (10/25 1141) Pulse Rate:  [61-88] 88 (10/25 1141) Resp:  [11-21] 18 (10/25 0957) BP: (141-175)/(79-97) 141/95 mmHg (10/25 1141) SpO2:  [96 %-100 %] 100 % (10/25 1141)  Intake/Output from previous day: 10/24 0701 - 10/25 0700 In: 500 [I.V.:500] Out: -  Intake/Output from this shift:    Physical Exam: Neck: no adenopathy, no carotid bruit, no JVD and supple, symmetrical, trachea midline Lungs: clear to auscultation bilaterally Heart: regular rate and rhythm, S1, S2 normal and soft systolic murmur noted Abdomen: soft, non-tender; bowel sounds normal; no masses,  no organomegaly Extremities: extremities normal, atraumatic, no cyanosis or edema  Lab Results:  Recent Labs  04/28/15 1414 04/28/15 1420 04/28/15 2000  WBC 9.4  --  10.2  HGB 13.4 15.0 14.5  PLT 210  --  245    Recent Labs  04/28/15 1414 04/28/15 1420 04/28/15 2000  NA 139 137 141  K 4.0 3.9 3.7  CL 106 106 105  CO2 21*  --  28  GLUCOSE 380* 381* 221*  BUN 10 12 8   CREATININE 0.97 0.90 0.85   No results for input(s): TROPONINI in the last 72 hours.  Invalid input(s): CK, MB Hepatic Function Panel  Recent Labs  04/28/15 2000  PROT 6.8  ALBUMIN 4.1  AST 17  ALT 17  ALKPHOS 81  BILITOT 0.7    Recent Labs  04/29/15 0439  CHOL 212*   No results for input(s): PROTIME in the last 72 hours.  Imaging: Imaging results have been reviewed and Dg Chest 2 View  04/28/2015  CLINICAL DATA:  Right-sided weakness and slurred speech.  Confusion EXAM: CHEST  2 VIEW COMPARISON:  11/12/2012 FINDINGS: Stable mild cardiac enlargement with uncoiling of the aorta. Vascular pattern is normal. Lungs are clear. No pleural  effusions. IMPRESSION: No active cardiopulmonary disease. Electronically Signed   By: Skipper Cliche M.D.   On: 04/28/2015 19:59   Ct Head Wo Contrast  04/28/2015  CLINICAL DATA:  Right-sided weakness and altered speech beginning today. Initial encounter. EXAM: CT HEAD WITHOUT CONTRAST TECHNIQUE: Contiguous axial images were obtained from the base of the skull through the vertex without intravenous contrast. COMPARISON:  Head CT scan 12/30/2007. FINDINGS: There is some hypoattenuation in the subcortical and periventricular deep white matter consistent chronic microvascular ischemic change. No evidence of acute abnormality including infarct, hemorrhage, mass lesion, mass effect, midline shift or abnormal extra-axial fluid collection is seen. No hydrocephalus or pneumocephalus. The calvarium is intact. Imaged paranasal sinuses and mastoid air cells are clear. IMPRESSION: No acute abnormality. Electronically Signed   By: Inge Rise M.D.   On: 04/28/2015 14:34   Mr Brain Wo Contrast  04/29/2015  CLINICAL DATA:  RIGHT arm weakness beginning at 12:30 last night, altered mental status today with slurred speech. History CHF, diabetes and hypertension. Evaluate for stroke. EXAM: MRI HEAD WITHOUT CONTRAST MRA HEAD WITHOUT CONTRAST TECHNIQUE: Multiplanar, multiecho pulse sequences of the brain and surrounding structures were obtained without intravenous contrast. Angiographic images of the head were obtained using MRA technique without contrast. COMPARISON:  CT head April 28, 2015. FINDINGS: MRI HEAD FINDINGS Confluent reduced diffusion within LEFT mesial frontoparietal lobes, extending to the cortex with reduced diffusion within  LEFT corpus callosum, from the rostrum to body. Corresponding low ADC values and mild FLAIR T2 hyperintense signal. No susceptibility artifact to suggest hemorrhage. Patchy supratentorial white matter T2 hyperintensities exclusive of the aforementioned abnormality compatible with  chronic small vessel ischemic disease. No abnormal extra-axial fluid collections. Ocular globes and orbital contents unremarkable. Visualized paranasal sinuses and mastoid air cells are well aerated. No abnormal sellar expansion. No cerebellar tonsillar ectopia. No abnormal calvarial bone marrow signal. Patient appears edentulous. MRA HEAD FINDINGS Mildly motion degraded examination. Anterior circulation: Normal flow related enhancement of the included cervical, petrous, cavernous and supra clinoid internal carotid arteries. Patent anterior communicating artery. Loss of LEFT A2 flow related enhancement. Moderate to high-grade stenosis RIGHT A2 segment. Flow related enhancement within the bilateral middle cerebral arteries and distal segments. No suspicious luminal irregularity, aneurysm. Posterior circulation: Codominant vertebral artery is. Basilar artery is patent, with normal flow related enhancement of the main branch vessels. Tiny bilateral posterior communicating arteries present. Normal flow related enhancement of the posterior cerebral arteries. No large vessel occlusion, high-grade stenosis, abnormal luminal irregularity, aneurysm. IMPRESSION: MRI HEAD: Acute large LEFT anterior cerebral artery territory infarct without hemorrhagic conversion. Moderate chronic small vessel ischemic disease. MRA HEAD: Acute LEFT A2 occlusion compatible with thromboembolic disease. Moderate to high-grade stenosis RIGHT A2 segment. Electronically Signed   By: Elon Alas M.D.   On: 04/29/2015 03:56   Mr Jodene Nam Head/brain Wo Cm  04/29/2015  CLINICAL DATA:  RIGHT arm weakness beginning at 12:30 last night, altered mental status today with slurred speech. History CHF, diabetes and hypertension. Evaluate for stroke. EXAM: MRI HEAD WITHOUT CONTRAST MRA HEAD WITHOUT CONTRAST TECHNIQUE: Multiplanar, multiecho pulse sequences of the brain and surrounding structures were obtained without intravenous contrast. Angiographic images  of the head were obtained using MRA technique without contrast. COMPARISON:  CT head April 28, 2015. FINDINGS: MRI HEAD FINDINGS Confluent reduced diffusion within LEFT mesial frontoparietal lobes, extending to the cortex with reduced diffusion within LEFT corpus callosum, from the rostrum to body. Corresponding low ADC values and mild FLAIR T2 hyperintense signal. No susceptibility artifact to suggest hemorrhage. Patchy supratentorial white matter T2 hyperintensities exclusive of the aforementioned abnormality compatible with chronic small vessel ischemic disease. No abnormal extra-axial fluid collections. Ocular globes and orbital contents unremarkable. Visualized paranasal sinuses and mastoid air cells are well aerated. No abnormal sellar expansion. No cerebellar tonsillar ectopia. No abnormal calvarial bone marrow signal. Patient appears edentulous. MRA HEAD FINDINGS Mildly motion degraded examination. Anterior circulation: Normal flow related enhancement of the included cervical, petrous, cavernous and supra clinoid internal carotid arteries. Patent anterior communicating artery. Loss of LEFT A2 flow related enhancement. Moderate to high-grade stenosis RIGHT A2 segment. Flow related enhancement within the bilateral middle cerebral arteries and distal segments. No suspicious luminal irregularity, aneurysm. Posterior circulation: Codominant vertebral artery is. Basilar artery is patent, with normal flow related enhancement of the main branch vessels. Tiny bilateral posterior communicating arteries present. Normal flow related enhancement of the posterior cerebral arteries. No large vessel occlusion, high-grade stenosis, abnormal luminal irregularity, aneurysm. IMPRESSION: MRI HEAD: Acute large LEFT anterior cerebral artery territory infarct without hemorrhagic conversion. Moderate chronic small vessel ischemic disease. MRA HEAD: Acute LEFT A2 occlusion compatible with thromboembolic disease. Moderate to  high-grade stenosis RIGHT A2 segment. Electronically Signed   By: Elon Alas M.D.   On: 04/29/2015 03:56    Cardiac Studies:  Assessment/Plan:  Acute large left anterior cerebral artery territory infarct.with right paresis Acute  Left A2 occlusion, compatible with  probable thromboembolic disease. Moderate to high-grade stenosis right A2 segment. Mild coronary artery disease in the past. Uncontrolled hypertension. Uncontrolled diabetes mellitus. Compensated congestive heart failure secondary to preserved LV systolic function. History of tobacco abuse. Hyperlipidemia. Plan Start Lantus insulin 10 units daily at night. Increase atorvastatin to 40 mg daily. Check 2-D echo. OT, PT/speech consult. Inpatient rehabilitation consult. Further workup as per neurology  LOS: 1 day    Charolette Forward 04/29/2015, 3:05 PM

## 2015-04-29 NOTE — Progress Notes (Signed)
Inpatient Diabetes Program Recommendations  AACE/ADA: New Consensus Statement on Inpatient Glycemic Control (2015)  Target Ranges:  Prepandial:   less than 140 mg/dL      Peak postprandial:   less than 180 mg/dL (1-2 hours)      Critically ill patients:  140 - 180 mg/dL   Review of Glycemic Control:  Results for KEYLY, BALDONADO (MRN [id number]) as of 04/29/2015 13:06  Ref. Range 04/28/2015 22:15 04/28/2015 23:51 04/29/2015 04:50 04/29/2015 07:42 04/29/2015 11:16  Glucose-Capillary Latest Ref Range: 65-99 mg/dL 216 (H) 227 (H) 226 (H) 244 (H) 204 (H)   Diabetes history: Type 2 diabetes Outpatient Diabetes medications: No diabetes medications listed Current orders for Inpatient glycemic control:  Novolog sensitive tid with meals  Inpatient Diabetes Program Recommendations:    A1C pending.  Blood sugars greater than hospital goal.  Consider adding basal insulin such as Lantus 15 units while patient is in the hospital.  A1C may give a better idea of whether patient will need insulin after d/c from hospital.    Will follow.  Thanks, Adah Perl, RN, BC-ADM Inpatient Diabetes Coordinator Pager [phone] (8a-5p)

## 2015-04-29 NOTE — Care Management Note (Signed)
Case Management Note  Patient Details  Name: [name] MRN: [id number] Date of Birth: 09-Apr-1946  Subjective/Objective:                    Action/Plan: Patient was admitted with CVA. Will follow for discharge needs pending PT/OT evals and physician orders.  Expected Discharge Date:                  Expected Discharge Plan:  [place]  In-House Referral:     Discharge planning Services     Post Acute Care Choice:    Choice offered to:     DME Arranged:    DME Agency:     HH Arranged:    [place] Agency:     Status of Service:  In process, will continue to follow  Medicare Important Message Given:    Date Medicare IM Given:    Medicare IM give by:    Date Additional Medicare IM Given:    Additional Medicare Important Message give by:     If discussed at [place] of Stay Meetings, dates discussed:    Additional Comments:  Rolm Baptise, RN 04/29/2015, 11:04 AM

## 2015-04-29 NOTE — Evaluation (Signed)
Physical Therapy Evaluation Patient Details Name: [name] MRN: [id number] DOB: 23-Jul-1945 Today's Date: 04/29/2015   History of Present Illness  Adm 10/24 with rt sided weakness; MRI + large lt ACA CVA   PMHx-CHF, CAD, HTN, tobacco use, DM, HTN  Clinical Impression  Pt admitted with above diagnosis. Lethargic during evaluation (did not note any meds potentially causing this). Anticipate will require less assist when more alert as she demonstrated strength 2+ to 3- throughout Rt side. Pt currently with functional limitations due to the deficits listed below (see PT Problem List).  Pt will benefit from skilled PT to increase their independence and safety with mobility to allow discharge to the venue listed below.       Follow Up Recommendations CIR    Equipment Recommendations  Other (comment) (TBA)    Recommendations for Other Services Rehab consult;OT consult;Speech consult     Precautions / Restrictions Precautions Precautions: Fall      Mobility  Bed Mobility Overal bed mobility: Needs Assistance Bed Mobility: Rolling;Sidelying to Sit;Sit to Supine Rolling: Mod assist Sidelying to sit: Mod assist;HOB elevated   Sit to supine: Mod assist   General bed mobility comments: lethargy and slow response to requests  Transfers Overall transfer level: Needs assistance Equipment used: None Transfers: Sit to/from Stand Sit to Stand: Mod assist         General transfer comment: pad under pelvis to assist with hip/knee extension; good control of descent  Ambulation/Gait Ambulation/Gait assistance: Mod assist Ambulation Distance (Feet): 2 Feet Assistive device: 1 person hand held assist Gait Pattern/deviations: Step-to pattern     General Gait Details: side step to Rt to [facility]; pt holding PT's arms and PT supporing via pelvis/trunk; assist to weight shift to Lt and advance RLE  Stairs            Wheelchair Mobility    Modified Rankin (Stroke Patients  Only) Modified Rankin (Stroke Patients Only) Pre-Morbid Rankin Score: No symptoms Modified Rankin: Moderately severe disability     Balance Overall balance assessment: Needs assistance Sitting-balance support: No upper extremity supported;Feet supported Sitting balance-Leahy Scale: Poor Sitting balance - Comments: minguard due to Rt lean Postural control: Right lateral lean Standing balance support: Bilateral upper extremity supported Standing balance-Leahy Scale: Poor Standing balance comment: posterior and Rt lean                             Pertinent Vitals/Pain Pain Assessment: No/denies pain    Home Living Family/patient expects to be discharged to:: Unsure                      Prior Function Level of Independence: Independent         Comments: assumed per chart     Hand Dominance   Dominant Hand: Right    Extremity/Trunk Assessment   Upper Extremity Assessment: Defer to OT evaluation           Lower Extremity Assessment: RLE deficits/detail RLE Deficits / Details: hip 3-; knee 3-; ankle 0/5 (?accuracy due to lethargy)    Cervical / Trunk Assessment: Other exceptions  Communication   Communication: Expressive difficulties  Cognition Arousal/Alertness: Lethargic Behavior During Therapy: Flat affect Overall Cognitive Status: Difficult to assess                      General Comments      Exercises  Assessment/Plan    PT Assessment Patient needs continued PT services  PT Diagnosis Hemiplegia dominant side   PT Problem List Decreased strength;Decreased activity tolerance;Decreased balance;Decreased mobility;Decreased knowledge of use of DME;Decreased cognition  PT Treatment Interventions DME instruction;Gait training;Stair training;Functional mobility training;Therapeutic activities;Balance training;Neuromuscular re-education;Cognitive remediation;Patient/family education   PT Goals (Current goals can be found  in the Care Plan section) Acute Rehab PT Goals Patient Stated Goal: unable to state due to lethargy; decr speech PT Goal Formulation: Patient unable to participate in goal setting Time For Goal Achievement: 05/06/15 Potential to Achieve Goals: Good    Frequency Min 4X/week   Barriers to discharge        Co-evaluation               End of Session Equipment Utilized During Treatment: Gait belt Activity Tolerance: Patient limited by lethargy Patient left: in bed;with call bell/phone within reach;with bed alarm set;with nursing/sitter in room Nurse Communication: Mobility status (with nurse tech)         Time: 9798-9211 PT Time Calculation (min) (ACUTE ONLY): 22 min   Charges:   PT Evaluation $Initial PT Evaluation Tier I: 1 Procedure     PT G Codes:        Jirah Rider May 05, 2015, 10:21 AM Pager [phone]

## 2015-04-30 ENCOUNTER — Inpatient Hospital Stay (HOSPITAL_COMMUNITY): Payer: Medicare Other

## 2015-04-30 DIAGNOSIS — F17200 Nicotine dependence, unspecified, uncomplicated: Secondary | ICD-10-CM

## 2015-04-30 DIAGNOSIS — I639 Cerebral infarction, unspecified: Secondary | ICD-10-CM

## 2015-04-30 DIAGNOSIS — R69 Illness, unspecified: Secondary | ICD-10-CM

## 2015-04-30 DIAGNOSIS — I10 Essential (primary) hypertension: Secondary | ICD-10-CM

## 2015-04-30 DIAGNOSIS — G8190 Hemiplegia, unspecified affecting unspecified side: Secondary | ICD-10-CM

## 2015-04-30 LAB — HEMOGLOBIN A1C
HEMOGLOBIN A1C: 11.6 % — AB (ref 4.8–5.6)
Mean Plasma Glucose: 286 mg/dL

## 2015-04-30 LAB — GLUCOSE, CAPILLARY
GLUCOSE-CAPILLARY: 196 mg/dL — AB (ref 65–99)
Glucose-Capillary: 187 mg/dL — ABNORMAL HIGH (ref 65–99)
Glucose-Capillary: 217 mg/dL — ABNORMAL HIGH (ref 65–99)
Glucose-Capillary: 211 mg/dL — ABNORMAL HIGH (ref 65–99)

## 2015-04-30 MED ORDER — BACITRACIN-NEOMYCIN-POLYMYXIN OINTMENT TUBE
TOPICAL_OINTMENT | Freq: Two times a day (BID) | CUTANEOUS | Status: DC
  Administered 2015-04-30: 1 via TOPICAL
  Administered 2015-05-01 – 2015-05-03 (×5): via TOPICAL
  Administered 2015-05-03 – 2015-05-04 (×2): 1 via TOPICAL
  Administered 2015-05-04 – 2015-05-06 (×4): via TOPICAL
  Filled 2015-04-30 (×2): qty 15

## 2015-04-30 MED ORDER — BACITRACIN-NEOMYCIN-POLYMYXIN 400-5-5000 EX OINT
TOPICAL_OINTMENT | Freq: Two times a day (BID) | CUTANEOUS | Status: DC
  Filled 2015-04-30 (×3): qty 1

## 2015-04-30 MED ORDER — ASPIRIN EC 81 MG PO TBEC
81.0000 mg | DELAYED_RELEASE_TABLET | Freq: Every day | ORAL | Status: DC
  Administered 2015-05-01 – 2015-05-04 (×4): 81 mg via ORAL
  Filled 2015-04-30 (×5): qty 1

## 2015-04-30 MED ORDER — INSULIN GLARGINE 100 UNIT/ML ~~LOC~~ SOLN
15.0000 [IU] | Freq: Every day | SUBCUTANEOUS | Status: DC
  Administered 2015-04-30: 15 [IU] via SUBCUTANEOUS
  Filled 2015-04-30 (×2): qty 0.15

## 2015-04-30 MED ORDER — ONDANSETRON HCL 4 MG/2ML IJ SOLN
4.0000 mg | Freq: Once | INTRAMUSCULAR | Status: AC
  Administered 2015-04-30: 4 mg via INTRAVENOUS
  Filled 2015-04-30: qty 2

## 2015-04-30 NOTE — Consult Note (Signed)
Physical Medicine and Rehabilitation Consult Reason for Consult: Acute left ACA infarct Referring Physician: Dr. Terrence Dupont   HPI: [name] is a 69 y.o.right handed female with history of tobacco abuse, hypertension, coronary artery disease maintain on aspirin 81 mg daily, diabetes mellitus as well as systolic congestive heart failure. By report patient lives with granddaughter independent prior to admission. Present 04/28/2015 with right-sided weakness and slurred speech and altered mental status. MRI showed acute large left ACA infarct. MRA of the head showed acute left A2 occlusion compatible with thromboembolic disease. Patient did not receive TPA. Echocardiogram ejection fraction 32% grade 1 diastolic dysfunction without PFO or embolism. Carotid Dopplers with no ICA stenosis. Neurology consulted placed on Plavix for CVA prophylaxis as well as subcutaneous heparin for DVT prophylaxis. Presently maintained on a mechanical soft diet. Patient with reported increased lethargy 04/30/2015 follow-up CT scan without acute changes. Hemoglobin A1c 11.6 with insulin therapy as directed. Physical therapy evaluation completed 04/29/2015 with recommendations of physical medicine rehabilitation consult.   Review of Systems  Constitutional: Negative for fever and chills.  HENT: Negative for hearing loss.   Eyes: Negative for blurred vision and double vision.  Respiratory: Positive for cough and shortness of breath.   Cardiovascular: Positive for leg swelling. Negative for chest pain and palpitations.  Gastrointestinal: Positive for constipation. Negative for nausea, vomiting and abdominal pain.  Genitourinary: Negative for dysuria and hematuria.  Musculoskeletal: Positive for myalgias and joint pain.  Skin: Negative for rash.  Neurological: Positive for speech change and weakness. Negative for seizures, loss of consciousness and headaches.  All other systems reviewed and are negative.  Past  Medical History  Diagnosis Date  . CHF (congestive heart failure) ([place])   . Diabetes ([place])   . Hypertension    Past Surgical History  Procedure Laterality Date  . Cardiac surgery      Cath without stent   Family History  Problem Relation Age of Onset  . Hypertension Mother   . Heart failure Mother   . Hyperlipidemia Mother    Social History:  reports that she has been smoking Cigarettes.  She does not have any smokeless tobacco history on file. She reports that she does not drink alcohol or use illicit drugs. Allergies: No Known Allergies Medications Prior to Admission  Medication Sig Dispense Refill  . amLODipine (NORVASC) 5 MG tablet Take 5 mg by mouth daily.    Marland Kitchen aspirin EC 81 MG EC tablet Take 1 tablet (81 mg total) by mouth daily. 1 tablet 30  . atorvastatin (LIPITOR) 10 MG tablet Take 1 tablet (10 mg total) by mouth daily. 30 tablet 3  . nitroGLYCERIN (NITROSTAT) 0.4 MG SL tablet Place 0.4 mg under the tongue every 5 (five) minutes as needed for chest pain.    . pantoprazole (PROTONIX) 40 MG tablet Take 1 tablet (40 mg total) by mouth daily at 6 (six) AM. 30 tablet 3    Home: Home Living Family/patient expects to be discharged to:: Unsure Available Help at Discharge: Family  Lives With: Other (Comment) (granddaughter)  Functional History: Prior Function Level of Independence: Independent Comments: assumed per chart Functional Status:  Mobility: Bed Mobility Overal bed mobility: Needs Assistance Bed Mobility: Rolling, Sidelying to Sit, Sit to Supine Rolling: Mod assist Sidelying to sit: Mod assist, HOB elevated Sit to supine: Mod assist General bed mobility comments: lethargy and slow response to requests Transfers Overall transfer level: Needs assistance Equipment used: None Transfers: Sit to/from Stand Sit  to Stand: Mod assist General transfer comment: pad under pelvis to assist with hip/knee extension; good control of  descent Ambulation/Gait Ambulation/Gait assistance: Mod assist Ambulation Distance (Feet): 2 Feet Assistive device: 1 person hand held assist Gait Pattern/deviations: Step-to pattern General Gait Details: side step to Rt to [facility]; pt holding PT's arms and PT supporing via pelvis/trunk; assist to weight shift to Lt and advance RLE    ADL:    Cognition: Cognition Overall Cognitive Status: Difficult to assess Arousal/Alertness: Lethargic Orientation Level: Disoriented X4 (Patient not speaking, mute) Attention: Sustained, Focused Focused Attention: Impaired Focused Attention Impairment: Verbal basic Sustained Attention: Impaired Sustained Attention Impairment: Verbal basic Comments: limited assessment due to lethargy Cognition Arousal/Alertness: Lethargic Behavior During Therapy: Flat affect Overall Cognitive Status: Difficult to assess Difficult to assess due to: Level of arousal (+name, DOB, location, situation)  Blood pressure 142/70, pulse 56, temperature 97.5 F (36.4 C), temperature source Axillary, resp. rate 16, height 5\' 7"  (1.702 m), weight 72.9 kg (160 lb 11.5 oz), SpO2 100 %. Physical Exam  Vitals reviewed. Constitutional: She appears well-developed.  HENT:  Head: Normocephalic.  Eyes:  Pupils reactive to light  Neck: Normal range of motion. Neck supple. No thyromegaly present.  Cardiovascular: Normal rate and regular rhythm.   Respiratory: Effort normal and breath sounds normal. No respiratory distress.  GI: Soft. Bowel sounds are normal. She exhibits no distension.  Neurological:  Patient is lethargic but arousable. She was nonverbal. Has a left gaze preference. She did not follow commands  Skin: Skin is warm and dry.  Neuro:  Eyes without evidence of horz nystagmus  Tone is reduced On the right side Cerebellar exam Unable to do finger nose finger or heel to shin testingOn the right side secondary to weakness No evidence of trunkal ataxia  Motor strength is  5/5 in Right deltoid, biceps, triceps, finger flexors and extensors, wrist flexors and extensors, hip flexors, knee flexors and extensors, ankle dorsiflexors, plantar flexors, invertors and evertors, toe flexors and extensors 0/5 on the left side Sensory exam is normal to pinprick, proprioception and light touch in the upper and lower limbs   Cranial nerves: Unable to assess secondary to lethargy     Results for orders placed or performed during the hospital encounter of 04/28/15 (from the past 24 hour(s))  Glucose, capillary     Status: Abnormal   Collection Time: 04/29/15 11:16 AM  Result Value Ref Range   Glucose-Capillary 204 (H) 65 - 99 mg/dL  Urine rapid drug screen (hosp performed)not at [facility]     Status: None   Collection Time: 04/29/15 12:09 PM  Result Value Ref Range   Opiates NONE DETECTED NONE DETECTED   Cocaine NONE DETECTED NONE DETECTED   Benzodiazepines NONE DETECTED NONE DETECTED   Amphetamines NONE DETECTED NONE DETECTED   Tetrahydrocannabinol NONE DETECTED NONE DETECTED   Barbiturates NONE DETECTED NONE DETECTED  Urinalysis, Routine w reflex microscopic (not at [facility])     Status: Abnormal   Collection Time: 04/29/15 12:10 PM  Result Value Ref Range   Color, Urine YELLOW YELLOW   APPearance CLOUDY (A) CLEAR   Specific Gravity, Urine 1.013 1.005 - 1.030   pH 5.0 5.0 - 8.0   Glucose, UA 500 (A) NEGATIVE mg/dL   Hgb urine dipstick TRACE (A) NEGATIVE   Bilirubin Urine NEGATIVE NEGATIVE   Ketones, ur NEGATIVE NEGATIVE mg/dL   Protein, ur NEGATIVE NEGATIVE mg/dL   Urobilinogen, UA 0.2 0.0 - 1.0 mg/dL   Nitrite NEGATIVE NEGATIVE  Leukocytes, UA MODERATE (A) NEGATIVE  Urine microscopic-add on     Status: Abnormal   Collection Time: 04/29/15 12:10 PM  Result Value Ref Range   Squamous Epithelial / LPF RARE RARE   WBC, UA 11-20 <3 WBC/hpf   RBC / HPF 0-2 <3 RBC/hpf   Bacteria, UA MANY (A) RARE  Glucose, capillary     Status: Abnormal   Collection Time: 04/29/15   4:36 PM  Result Value Ref Range   Glucose-Capillary 217 (H) 65 - 99 mg/dL  Glucose, capillary     Status: Abnormal   Collection Time: 04/29/15  9:59 PM  Result Value Ref Range   Glucose-Capillary 173 (H) 65 - 99 mg/dL   Comment 1 Notify RN    Comment 2 Document in Chart   Glucose, capillary     Status: Abnormal   Collection Time: 04/30/15  6:44 AM  Result Value Ref Range   Glucose-Capillary 187 (H) 65 - 99 mg/dL   Comment 1 Notify RN    Comment 2 Document in Chart    Dg Chest 2 View  04/28/2015  CLINICAL DATA:  Right-sided weakness and slurred speech.  Confusion EXAM: CHEST  2 VIEW COMPARISON:  11/12/2012 FINDINGS: Stable mild cardiac enlargement with uncoiling of the aorta. Vascular pattern is normal. Lungs are clear. No pleural effusions. IMPRESSION: No active cardiopulmonary disease. Electronically Signed   By: Skipper Cliche M.D.   On: 04/28/2015 19:59   Ct Head Wo Contrast  04/30/2015  CLINICAL DATA:  Aphasia with inability to speak this morning. EXAM: CT HEAD WITHOUT CONTRAST TECHNIQUE: Contiguous axial images were obtained from the base of the skull through the vertex without intravenous contrast. COMPARISON:  Two days ago FINDINGS: Skull and Sinuses:No acute or new finding. Orbits: Negative. Brain: Recently characterize infarcts in the left ACA distribution show no evidence of progression. 4 mm area of high density in the posterior left cerebral hemisphere on series 2, image 20 is near a cortical infarct based on MRI; favor volume-averaging over petechial hemorrhage. No new vascular hyperdensity. No hydrocephalus or shift. IMPRESSION: 1. Acute left ACA territory infarcts without evidence of progression. No interval finding to explain new deficit. 2. Tiny hyperintensity in the posterior left cerebral cortex, favor volume-averaging over petechial hemorrhage. Electronically Signed   By: Monte Fantasia M.D.   On: 04/30/2015 08:50   Ct Head Wo Contrast  04/28/2015  CLINICAL DATA:   Right-sided weakness and altered speech beginning today. Initial encounter. EXAM: CT HEAD WITHOUT CONTRAST TECHNIQUE: Contiguous axial images were obtained from the base of the skull through the vertex without intravenous contrast. COMPARISON:  Head CT scan 12/30/2007. FINDINGS: There is some hypoattenuation in the subcortical and periventricular deep white matter consistent chronic microvascular ischemic change. No evidence of acute abnormality including infarct, hemorrhage, mass lesion, mass effect, midline shift or abnormal extra-axial fluid collection is seen. No hydrocephalus or pneumocephalus. The calvarium is intact. Imaged paranasal sinuses and mastoid air cells are clear. IMPRESSION: No acute abnormality. Electronically Signed   By: Inge Rise M.D.   On: 04/28/2015 14:34   Mr Brain Wo Contrast  04/29/2015  CLINICAL DATA:  RIGHT arm weakness beginning at 12:30 last night, altered mental status today with slurred speech. History CHF, diabetes and hypertension. Evaluate for stroke. EXAM: MRI HEAD WITHOUT CONTRAST MRA HEAD WITHOUT CONTRAST TECHNIQUE: Multiplanar, multiecho pulse sequences of the brain and surrounding structures were obtained without intravenous contrast. Angiographic images of the head were obtained using MRA technique without contrast. COMPARISON:  CT head April 28, 2015. FINDINGS: MRI HEAD FINDINGS Confluent reduced diffusion within LEFT mesial frontoparietal lobes, extending to the cortex with reduced diffusion within LEFT corpus callosum, from the rostrum to body. Corresponding low ADC values and mild FLAIR T2 hyperintense signal. No susceptibility artifact to suggest hemorrhage. Patchy supratentorial white matter T2 hyperintensities exclusive of the aforementioned abnormality compatible with chronic small vessel ischemic disease. No abnormal extra-axial fluid collections. Ocular globes and orbital contents unremarkable. Visualized paranasal sinuses and mastoid air cells are  well aerated. No abnormal sellar expansion. No cerebellar tonsillar ectopia. No abnormal calvarial bone marrow signal. Patient appears edentulous. MRA HEAD FINDINGS Mildly motion degraded examination. Anterior circulation: Normal flow related enhancement of the included cervical, petrous, cavernous and supra clinoid internal carotid arteries. Patent anterior communicating artery. Loss of LEFT A2 flow related enhancement. Moderate to high-grade stenosis RIGHT A2 segment. Flow related enhancement within the bilateral middle cerebral arteries and distal segments. No suspicious luminal irregularity, aneurysm. Posterior circulation: Codominant vertebral artery is. Basilar artery is patent, with normal flow related enhancement of the main branch vessels. Tiny bilateral posterior communicating arteries present. Normal flow related enhancement of the posterior cerebral arteries. No large vessel occlusion, high-grade stenosis, abnormal luminal irregularity, aneurysm. IMPRESSION: MRI HEAD: Acute large LEFT anterior cerebral artery territory infarct without hemorrhagic conversion. Moderate chronic small vessel ischemic disease. MRA HEAD: Acute LEFT A2 occlusion compatible with thromboembolic disease. Moderate to high-grade stenosis RIGHT A2 segment. Electronically Signed   By: Elon Alas M.D.   On: 04/29/2015 03:56   Mr Jodene Nam Head/brain Wo Cm  04/29/2015  CLINICAL DATA:  RIGHT arm weakness beginning at 12:30 last night, altered mental status today with slurred speech. History CHF, diabetes and hypertension. Evaluate for stroke. EXAM: MRI HEAD WITHOUT CONTRAST MRA HEAD WITHOUT CONTRAST TECHNIQUE: Multiplanar, multiecho pulse sequences of the brain and surrounding structures were obtained without intravenous contrast. Angiographic images of the head were obtained using MRA technique without contrast. COMPARISON:  CT head April 28, 2015. FINDINGS: MRI HEAD FINDINGS Confluent reduced diffusion within LEFT mesial  frontoparietal lobes, extending to the cortex with reduced diffusion within LEFT corpus callosum, from the rostrum to body. Corresponding low ADC values and mild FLAIR T2 hyperintense signal. No susceptibility artifact to suggest hemorrhage. Patchy supratentorial white matter T2 hyperintensities exclusive of the aforementioned abnormality compatible with chronic small vessel ischemic disease. No abnormal extra-axial fluid collections. Ocular globes and orbital contents unremarkable. Visualized paranasal sinuses and mastoid air cells are well aerated. No abnormal sellar expansion. No cerebellar tonsillar ectopia. No abnormal calvarial bone marrow signal. Patient appears edentulous. MRA HEAD FINDINGS Mildly motion degraded examination. Anterior circulation: Normal flow related enhancement of the included cervical, petrous, cavernous and supra clinoid internal carotid arteries. Patent anterior communicating artery. Loss of LEFT A2 flow related enhancement. Moderate to high-grade stenosis RIGHT A2 segment. Flow related enhancement within the bilateral middle cerebral arteries and distal segments. No suspicious luminal irregularity, aneurysm. Posterior circulation: Codominant vertebral artery is. Basilar artery is patent, with normal flow related enhancement of the main branch vessels. Tiny bilateral posterior communicating arteries present. Normal flow related enhancement of the posterior cerebral arteries. No large vessel occlusion, high-grade stenosis, abnormal luminal irregularity, aneurysm. IMPRESSION: MRI HEAD: Acute large LEFT anterior cerebral artery territory infarct without hemorrhagic conversion. Moderate chronic small vessel ischemic disease. MRA HEAD: Acute LEFT A2 occlusion compatible with thromboembolic disease. Moderate to high-grade stenosis RIGHT A2 segment. Electronically Signed   By: Elon Alas M.D.   On: 04/29/2015 03:56  Assessment/Plan: Diagnosis: Left ACA infarct with right  hemiparesis, cognitive deficits related to CVA and lethargy 1. Does the need for close, 24 hr/day medical supervision in concert with the patient's rehab needs make it unreasonable for this patient to be served in a less intensive setting? Yes 2. Co-Morbidities requiring supervision/potential complications: Coronary artery disease, diabetes mellitus, systolic CHF 3. Due to bladder management, bowel management, safety, skin/wound care, disease management, medication administration, pain management and patient education, does the patient require 24 hr/day rehab nursing? Yes 4. Does the patient require coordinated care of a physician, rehab nurse, PT (1-2 hrs/day, 5 days/week), OT (1-2 hrs/day, 5 days/week) and SLP (0.5-1 hrs/day, 5 days/week) to address physical and functional deficits in the context of the above medical diagnosis(es)? Yes Addressing deficits in the following areas: balance, endurance, locomotion, strength, transferring, bowel/bladder control, bathing, dressing, feeding, grooming, toileting, cognition, speech, language, swallowing and psychosocial support 5. Can the patient actively participate in an intensive therapy program of at least 3 hrs of therapy per day at least 5 days per week? anticipate ability in 2-3 days 6. The potential for patient to make measurable gains while on inpatient rehab is good 7. Anticipated functional outcomes upon discharge from inpatient rehab are supervision and min assist  with PT, min assist with OT, min assist with SLP. 8. Estimated rehab length of stay to reach the above functional goals is: 18-21 days 9. Does the patient have adequate social supports and living environment to accommodate these discharge functional goals? Yes 10. Anticipated D/C setting: Home 11. Anticipated post D/C treatments: [place] therapy 12. Overall Rehab/Functional Prognosis: good  RECOMMENDATIONS: This patient's condition is appropriate for continued rehabilitative care in the  following setting: CIR Patient has agreed to participate in recommended program. Yes Note that insurance prior authorization may be required for reimbursement for recommended care.  Comment: Patient to lethargic for rehabilitation. Rehabilitation nurse admission coordinator to monitor level of alertness. Anticipate ready in 2-3 days    04/30/2015

## 2015-04-30 NOTE — Progress Notes (Signed)
Subjective:  Patient more drowsy this morning, right lower extremity weakness, slightly more pronounced.  Objective:  Vital Signs in the last 24 hours: Temp:  [97.5 F (36.4 C)-99 F (37.2 C)] 98.8 F (37.1 C) (10/26 0932) Pulse Rate:  [56-88] 57 (10/26 0932) Resp:  [16-18] 16 (10/26 0932) BP: (139-159)/(65-95) 140/68 mmHg (10/26 0932) SpO2:  [94 %-100 %] 100 % (10/26 0932)  Intake/Output from previous day: 10/25 0701 - 10/26 0700 In: -  Out: 600 [Urine:600] Intake/Output from this shift:    Physical Exam: Neck: no adenopathy, no carotid bruit, no JVD and supple, symmetrical, trachea midline Lungs: decreased breath sounds at bases Heart: regular rate and rhythm, S1, S2 normal and systolic murmur noted Abdomen: soft, non-tender; bowel sounds normal; no masses,  no organomegaly Extremities: extremities normal, atraumatic, no cyanosis or edema Neuro drowsy but arousable, slurred speech noted. Motor strength right upper extremity 4 over 5 right lower extremity 2 over 5 with pronator drift on the right Lab Results:  Recent Labs  04/28/15 1414 04/28/15 1420 04/28/15 2000  WBC 9.4  --  10.2  HGB 13.4 15.0 14.5  PLT 210  --  245    Recent Labs  04/28/15 1414 04/28/15 1420 04/28/15 2000  NA 139 137 141  K 4.0 3.9 3.7  CL 106 106 105  CO2 21*  --  28  GLUCOSE 380* 381* 221*  BUN 10 12 8   CREATININE 0.97 0.90 0.85   No results for input(s): TROPONINI in the last 72 hours.  Invalid input(s): CK, MB Hepatic Function Panel  Recent Labs  04/28/15 2000  PROT 6.8  ALBUMIN 4.1  AST 17  ALT 17  ALKPHOS 81  BILITOT 0.7    Recent Labs  04/29/15 0439  CHOL 212*   No results for input(s): PROTIME in the last 72 hours.  Imaging: Imaging results have been reviewed and Dg Chest 2 View  04/28/2015  CLINICAL DATA:  Right-sided weakness and slurred speech.  Confusion EXAM: CHEST  2 VIEW COMPARISON:  11/12/2012 FINDINGS: Stable mild cardiac enlargement with uncoiling  of the aorta. Vascular pattern is normal. Lungs are clear. No pleural effusions. IMPRESSION: No active cardiopulmonary disease. Electronically Signed   By: Skipper Cliche M.D.   On: 04/28/2015 19:59   Ct Head Wo Contrast  04/30/2015  CLINICAL DATA:  Aphasia with inability to speak this morning. EXAM: CT HEAD WITHOUT CONTRAST TECHNIQUE: Contiguous axial images were obtained from the base of the skull through the vertex without intravenous contrast. COMPARISON:  Two days ago FINDINGS: Skull and Sinuses:No acute or new finding. Orbits: Negative. Brain: Recently characterize infarcts in the left ACA distribution show no evidence of progression. 4 mm area of high density in the posterior left cerebral hemisphere on series 2, image 20 is near a cortical infarct based on MRI; favor volume-averaging over petechial hemorrhage. No new vascular hyperdensity. No hydrocephalus or shift. IMPRESSION: 1. Acute left ACA territory infarcts without evidence of progression. No interval finding to explain new deficit. 2. Tiny hyperintensity in the posterior left cerebral cortex, favor volume-averaging over petechial hemorrhage. Electronically Signed   By: Monte Fantasia M.D.   On: 04/30/2015 08:50   Ct Head Wo Contrast  04/28/2015  CLINICAL DATA:  Right-sided weakness and altered speech beginning today. Initial encounter. EXAM: CT HEAD WITHOUT CONTRAST TECHNIQUE: Contiguous axial images were obtained from the base of the skull through the vertex without intravenous contrast. COMPARISON:  Head CT scan 12/30/2007. FINDINGS: There is some hypoattenuation in  the subcortical and periventricular deep white matter consistent chronic microvascular ischemic change. No evidence of acute abnormality including infarct, hemorrhage, mass lesion, mass effect, midline shift or abnormal extra-axial fluid collection is seen. No hydrocephalus or pneumocephalus. The calvarium is intact. Imaged paranasal sinuses and mastoid air cells are clear.  IMPRESSION: No acute abnormality. Electronically Signed   By: Inge Rise M.D.   On: 04/28/2015 14:34   Mr Brain Wo Contrast  04/29/2015  CLINICAL DATA:  RIGHT arm weakness beginning at 12:30 last night, altered mental status today with slurred speech. History CHF, diabetes and hypertension. Evaluate for stroke. EXAM: MRI HEAD WITHOUT CONTRAST MRA HEAD WITHOUT CONTRAST TECHNIQUE: Multiplanar, multiecho pulse sequences of the brain and surrounding structures were obtained without intravenous contrast. Angiographic images of the head were obtained using MRA technique without contrast. COMPARISON:  CT head April 28, 2015. FINDINGS: MRI HEAD FINDINGS Confluent reduced diffusion within LEFT mesial frontoparietal lobes, extending to the cortex with reduced diffusion within LEFT corpus callosum, from the rostrum to body. Corresponding low ADC values and mild FLAIR T2 hyperintense signal. No susceptibility artifact to suggest hemorrhage. Patchy supratentorial white matter T2 hyperintensities exclusive of the aforementioned abnormality compatible with chronic small vessel ischemic disease. No abnormal extra-axial fluid collections. Ocular globes and orbital contents unremarkable. Visualized paranasal sinuses and mastoid air cells are well aerated. No abnormal sellar expansion. No cerebellar tonsillar ectopia. No abnormal calvarial bone marrow signal. Patient appears edentulous. MRA HEAD FINDINGS Mildly motion degraded examination. Anterior circulation: Normal flow related enhancement of the included cervical, petrous, cavernous and supra clinoid internal carotid arteries. Patent anterior communicating artery. Loss of LEFT A2 flow related enhancement. Moderate to high-grade stenosis RIGHT A2 segment. Flow related enhancement within the bilateral middle cerebral arteries and distal segments. No suspicious luminal irregularity, aneurysm. Posterior circulation: Codominant vertebral artery is. Basilar artery is patent,  with normal flow related enhancement of the main branch vessels. Tiny bilateral posterior communicating arteries present. Normal flow related enhancement of the posterior cerebral arteries. No large vessel occlusion, high-grade stenosis, abnormal luminal irregularity, aneurysm. IMPRESSION: MRI HEAD: Acute large LEFT anterior cerebral artery territory infarct without hemorrhagic conversion. Moderate chronic small vessel ischemic disease. MRA HEAD: Acute LEFT A2 occlusion compatible with thromboembolic disease. Moderate to high-grade stenosis RIGHT A2 segment. Electronically Signed   By: Elon Alas M.D.   On: 04/29/2015 03:56   Mr Jodene Nam Head/brain Wo Cm  04/29/2015  CLINICAL DATA:  RIGHT arm weakness beginning at 12:30 last night, altered mental status today with slurred speech. History CHF, diabetes and hypertension. Evaluate for stroke. EXAM: MRI HEAD WITHOUT CONTRAST MRA HEAD WITHOUT CONTRAST TECHNIQUE: Multiplanar, multiecho pulse sequences of the brain and surrounding structures were obtained without intravenous contrast. Angiographic images of the head were obtained using MRA technique without contrast. COMPARISON:  CT head April 28, 2015. FINDINGS: MRI HEAD FINDINGS Confluent reduced diffusion within LEFT mesial frontoparietal lobes, extending to the cortex with reduced diffusion within LEFT corpus callosum, from the rostrum to body. Corresponding low ADC values and mild FLAIR T2 hyperintense signal. No susceptibility artifact to suggest hemorrhage. Patchy supratentorial white matter T2 hyperintensities exclusive of the aforementioned abnormality compatible with chronic small vessel ischemic disease. No abnormal extra-axial fluid collections. Ocular globes and orbital contents unremarkable. Visualized paranasal sinuses and mastoid air cells are well aerated. No abnormal sellar expansion. No cerebellar tonsillar ectopia. No abnormal calvarial bone marrow signal. Patient appears edentulous. MRA HEAD  FINDINGS Mildly motion degraded examination. Anterior circulation: Normal flow related enhancement of  the included cervical, petrous, cavernous and supra clinoid internal carotid arteries. Patent anterior communicating artery. Loss of LEFT A2 flow related enhancement. Moderate to high-grade stenosis RIGHT A2 segment. Flow related enhancement within the bilateral middle cerebral arteries and distal segments. No suspicious luminal irregularity, aneurysm. Posterior circulation: Codominant vertebral artery is. Basilar artery is patent, with normal flow related enhancement of the main branch vessels. Tiny bilateral posterior communicating arteries present. Normal flow related enhancement of the posterior cerebral arteries. No large vessel occlusion, high-grade stenosis, abnormal luminal irregularity, aneurysm. IMPRESSION: MRI HEAD: Acute large LEFT anterior cerebral artery territory infarct without hemorrhagic conversion. Moderate chronic small vessel ischemic disease. MRA HEAD: Acute LEFT A2 occlusion compatible with thromboembolic disease. Moderate to high-grade stenosis RIGHT A2 segment. Electronically Signed   By: Elon Alas M.D.   On: 04/29/2015 03:56    Cardiac Studies:  Assessment/Plan:  Acute large left anterior cerebral artery territory infarct.with right paresis  Acute Left A2 occlusion, compatible with probable thromboembolic disease.  Moderate to high-grade stenosis right A2 segment.  Mild coronary artery disease in the past.  Uncontrolled hypertension.  Uncontrolled diabetes mellitus.  Compensated congestive heart failure secondary to preserved LV systolic function.  History of tobacco abuse.  Hyperlipidemia. Plan Neuro reevaluated in the patient for possible extension of stroke. Will discuss regarding dual antiplatelet medications. Repeat CT as per neuro  LOS: 2 days    Charolette Forward 04/30/2015, 11:37 AM

## 2015-04-30 NOTE — Progress Notes (Signed)
Physical Therapy Treatment Patient Details Name: [name] MRN: [id number] DOB: 1946/06/06 Today's Date: 04/30/2015    History of Present Illness Adm 10/24 with rt sided weakness; MRI + large lt ACA CVA   PMHx-CHF, CAD, HTN, tobacco use, DM, HTN    PT Comments    Patient more lethargic this date with Lt gaze preference (although could turn head and eyes past midline to the Rt with name called). Less active movement in Rt extremities compared to 10/25. Noted incr NIHSS 2 to 11 overnight.    Follow Up Recommendations  CIR     Equipment Recommendations  Other (comment) (TBA)    Recommendations for Other Services Rehab consult;OT consult;Speech consult     Precautions / Restrictions Precautions Precautions: Fall Restrictions Weight Bearing Restrictions: No    Mobility  Bed Mobility Overal bed mobility: Needs Assistance Bed Mobility: Rolling;Sidelying to Sit Rolling: Mod assist Sidelying to sit: Max assist;+2 for safety/equipment       General bed mobility comments: incr lethargy requiring incr assist  Transfers Overall transfer level: Needs assistance Equipment used: None Transfers: Sit to/from [employer] Transfers Sit to Stand: Mod assist Stand pivot transfers: Mod assist;+2 safety/equipment;+2 physical assistance       General transfer comment: once in weight bearing, pt with +hip and knee extension (at times maintained full knee extension without RLE support vs slow buckling with decr attention to task); pivoted to her Lt advancing her LLE and attempted to move RLE  Ambulation/Gait                 Stairs            Wheelchair Mobility    Modified Rankin (Stroke Patients Only) Modified Rankin (Stroke Patients Only) Pre-Morbid Rankin Score: No symptoms Modified Rankin: Severe disability     Balance Overall balance assessment: Needs assistance Sitting-balance support: No upper extremity supported;Feet supported Sitting  balance-Leahy Scale: Zero Sitting balance - Comments: at times pushing with LUE causing fall towards Rt; if LUE unsupported, she leans to Rt due to trunk weakness Postural control: Right lateral lean Standing balance support: Single extremity supported Standing balance-Leahy Scale: Poor Standing balance comment: posterior and Rt lean                    Cognition Arousal/Alertness: Lethargic Behavior During Therapy: Flat affect Overall Cognitive Status: Difficult to assess Area of Impairment: Orientation;Attention;Following commands Orientation Level: Disoriented to;Time;Situation Current Attention Level: Sustained   Following Commands: Follows one step commands inconsistently            Exercises      General Comments General comments (skin integrity, edema, etc.): less active movement of rt extremities compared to 10/25. No AROM RLE noted with open-chain tasks; +muscle activation in weight bearing/closed chain      Pertinent Vitals/Pain Pain Assessment: Faces Faces Pain Scale: No hurt    Home Living Family/patient expects to be discharged to:: Unsure   Available Help at Discharge: Family                Prior Function Level of Independence: Independent      Comments: per RN   PT Goals (current goals can now be found in the care plan section) Acute Rehab PT Goals Patient Stated Goal: unable to state due to lethargy; decr speech Time For Goal Achievement: 05/06/15 Progress towards PT goals: Not progressing toward goals - comment (more lethargic)    Frequency  Min 4X/week    PT Plan  Current plan remains appropriate    Co-evaluation PT/OT/SLP Co-Evaluation/Treatment: Yes Reason for Co-Treatment: Complexity of the patient's impairments (multi-system involvement);For patient/therapist safety PT goals addressed during session: Mobility/safety with mobility;Balance;Strengthening/ROM OT goals addressed during session: ADL's and self-care     End of  Session Equipment Utilized During Treatment: Gait belt Activity Tolerance: Patient limited by lethargy Patient left: with call bell/phone within reach;in chair;with chair alarm set;Other (comment) (with OT)     Time: 1224-4975 PT Time Calculation (min) (ACUTE ONLY): 21 min  Charges:  $Therapeutic Activity: 8-22 mins                    G Codes:      Otoniel Myhand 2015-05-15, 10:13 AM  Pager [phone]

## 2015-04-30 NOTE — Progress Notes (Signed)
Speech Language Pathology Treatment: Dysphagia;Cognitive-Linquistic  Patient Details Name: [name] MRN: [id number] DOB: 09/09/1945 Today's Date: 04/30/2015 Time: 1448-1856 SLP Time Calculation (min) (ACUTE ONLY): 18 min  Assessment / Plan / Recommendation Clinical Impression  Pt more lethargic this afternoon as compared to initial evaluation on previous date, and today not verbalizing at all. She follows one-step commands with Min cues during familiar self-feeding task. Mod cues provided for right-sided attention. Pt has pocketing on her right posterior buccal cavity/tongue, needing liquid wash to clear. SLP provided education about safe swallowing strategies to family members present. Will downgrade diet to Dys 2 in light of current changes, continuing thin liquids. Will continue to follow.   HPI Other Pertinent Information: [name] is a 69 y.o. female with known HTN, DM and CHF who presents with right sided weakness and slurred speech. MRI shows acute large left ACA territory infarct without hemorrhagic conversion.   Pertinent Vitals Pain Assessment: Faces Faces Pain Scale: No hurt  SLP Plan  Continue with current plan of care    Recommendations Diet recommendations: Dysphagia 2 (fine chop);Thin liquid Liquids provided via: Cup;Straw Medication Administration: Crushed with puree Supervision: Patient able to self feed;Full supervision/cueing for compensatory strategies Compensations: Minimize environmental distractions;Slow rate;Small sips/bites Postural Changes and/or Swallow Maneuvers: Seated upright 90 degrees;Upright 30-60 min after meal       Oral Care Recommendations: Oral care BID Follow up Recommendations: Inpatient Rehab Plan: Continue with current plan of care    Germain Osgood, M.A. CCC-SLP [phone]  Germain Osgood 04/30/2015, 4:12 PM

## 2015-04-30 NOTE — Progress Notes (Signed)
Patient not speaking, lethargic, unable to keep attention, patient falls asleep while being spoken to, not verbalizing any words, NIHHS increased to 11, patient's right hand grip weaker than previous assessment. BP 142/70, pulse 56, temp 97.5, SaO2 100% on room air. Stroke team notified, stat CT ordered. RN transported patient to CT without issue, patient returned to room, HOB flat, bed alarm on. Patient is now sleeping in room.  Will continue to monitor closely.

## 2015-04-30 NOTE — Progress Notes (Signed)
Paged Neurology on call regarding pts change in NIH score, see flow sheets

## 2015-04-30 NOTE — Progress Notes (Signed)
STROKE TEAM PROGRESS NOTE   SUBJECTIVE (INTERVAL HISTORY) Pt initially more drowsy early in the morning, had stat CT done showed evolving left ACA infarct, no new progression or expansion. Later pt was seen sitting in chair, smile to provider. Discussed with son at bedside extensively, viewed images and updated him about pt situation, diagnosis, treatment and prognosis.    OBJECTIVE Temp:  [97.5 F (36.4 C)-99 F (37.2 C)] 98.8 F (37.1 C) (10/26 0932) Pulse Rate:  [56-88] 57 (10/26 0932) Cardiac Rhythm:  [-] Sinus bradycardia (10/26 0806) Resp:  [16-18] 16 (10/26 0932) BP: (139-159)/(65-95) 140/68 mmHg (10/26 0932) SpO2:  [94 %-100 %] 100 % (10/26 0932)  CBC:   Recent Labs Lab 04/28/15 1414 04/28/15 1420 04/28/15 2000  WBC 9.4  --  10.2  NEUTROABS 6.1  --   --   HGB 13.4 15.0 14.5  HCT 41.0 44.0 44.4  MCV 85.2  --  85.2  PLT 210  --  673    Basic Metabolic Panel:   Recent Labs Lab 04/28/15 1414 04/28/15 1420 04/28/15 2000  NA 139 137 141  K 4.0 3.9 3.7  CL 106 106 105  CO2 21*  --  28  GLUCOSE 380* 381* 221*  BUN 10 12 8   CREATININE 0.97 0.90 0.85  CALCIUM 9.3  --  9.9    Lipid Panel:     Component Value Date/Time   CHOL 212* 04/29/2015 0439   TRIG 236* 04/29/2015 0439   HDL 44 04/29/2015 0439   CHOLHDL 4.8 04/29/2015 0439   VLDL 47* 04/29/2015 0439   LDLCALC 121* 04/29/2015 0439   HgbA1c:  Lab Results  Component Value Date   HGBA1C 11.6* 04/29/2015   Urine Drug Screen:     Component Value Date/Time   LABOPIA NONE DETECTED 04/29/2015 1209   COCAINSCRNUR NONE DETECTED 04/29/2015 1209   LABBENZ NONE DETECTED 04/29/2015 1209   AMPHETMU NONE DETECTED 04/29/2015 1209   THCU NONE DETECTED 04/29/2015 1209   LABBARB NONE DETECTED 04/29/2015 1209      IMAGING I have personally reviewed the radiological images below and agree with the radiology interpretations.  Dg Chest 2 View 04/28/2015  No active cardiopulmonary disease.   Ct Head Wo  Contrast 04/30/2015 1. Acute left ACA territory infarcts without evidence of progression. No interval finding to explain new deficit. 2. Tiny hyperintensity in the posterior left cerebral cortex, favor volume-averaging over petechial hemorrhage. 04/28/2015  No acute abnormality.   MRI HEAD 04/29/2015   Acute large LEFT anterior cerebral artery territory infarct without hemorrhagic conversion. Moderate chronic small vessel ischemic disease.   MRA HEAD 04/29/2015   Acute LEFT A2 occlusion compatible with thromboembolic disease. Moderate to high-grade stenosis RIGHT A2 segment.   CUS - Bilateral: 1-39% ICA stenosis. Vertebral artery flow is antegrade.  TTE - Left ventricle: The cavity size was normal. Systolic function wasnormal. The estimated ejection fraction was in the range of 50%to 55%. Doppler parameters are consistent with abnormal leftventricular relaxation (grade 1 diastolic dysfunction). Atrial septum: No defect or patent foramen ovale was identified. Pericardium, extracardiac: A trivial pericardial effusion wasidentified. Features were not consistent with tamponadephysiology. Impressions: No cardiac source of embolism was identified, but cannot be ruledout on the basis of this examination.   PHYSICAL EXAM General - obesity, well developed, lethargic, abulia.  Ophthalmologic - Fundi not visualized due to noncooperation.  Cardiovascular - Regular rate and rhythm.  Mental Status -  Level of arousal and orientation to place, and person were intact, but  not to time. Language showed paucity of speech, able to follow simple commands, not cooperative on naming, repetition.   Cranial Nerves II - XII - II - Visual field intact OU. III, IV, VI - Extraocular movements intact. V - Facial sensation intact bilaterally. VII - mild right facial droop. VIII - Hearing & vestibular intact bilaterally. X - Palate elevates symmetrically, mild to moderate dysarthria. XI - Chin turning &  shoulder shrug intact bilaterally. XII - Tongue protrusion intact.  Motor Strength - The patient's strength exam not cooperative, but RUE proximal 3-/5, distal 3/5, RLE 1/5 and pronator drift was present on the right.  Bulk was normal and fasciculations were absent.   Motor Tone - Muscle tone was assessed at the neck and appendages and was normal.  Reflexes - The patient's reflexes were 1+ in all extremities and she had no pathological reflexes.  Sensory - Light touch, temperature/pinprick were assessed and were symmetrical.    Coordination - The patient had normal movements in the hands and feet with no ataxia or dysmetria.  Tremor was absent.  Gait and Station - not tested.    ASSESSMENT/PLAN [name] is a 69 y.o. female with history of HTN, DM and CHF presenting with right arm and leg weakness associated with slurred speech and confusion. She did not receive IV t-PA due to delay in arrival.   Stroke:  Dominant  L ACA infarct in setting of A2 occlusion, possible large vessel athero. Embolic stroke not able to completely ruled out.   Resultant  Right hemiparesis, dysarthria, abulia  MRI  L ACA infarct  MRA  L A2 occlusion, high grade stenosis R A2  Repeat CT 10/26 am due to decreased speech output with no acute or interval change. Feel findings related to stroke location, abulia  Carotid Doppler  No significant stenosis   2D Echo  unremarkable  LDL 121  HgbA1c 11.6  Heparin 5000 units sq tid for VTE prophylaxis DIET DYS 3 Room service appropriate?: Yes; Fluid consistency:: Thin  aspirin 81 mg daily prior to admission, changed to plavix. Cardiology suggests dual antiplatelet for both stroke and cardiac prevention, we agree with that.   Patient counseled to be compliant with her antithrombotic medications  Recommend OP tele monitoring to look for atrial fibrillation as source of stroke  Ongoing aggressive stroke risk factor management  Therapy recommendations:   CIR  Disposition:  pending   Hypertension  Home meds: norvasc 5 and metoprolol 50mg   Stable  On norvasc 5mg  and metoprolol 50mg   BP goal gradually normalize   Hyperlipidemia  Home meds:  lipitor 10, resumed in hospital  LDL 121, goal < 70  Increased lipitor to 40mg   Continue statin at discharge  Diabetes type II  HgbA1c 11.6, goal < 7.0  Uncontrolled  On lantus  SSI  Tobacco abuse  Current smoker  Smoking cessation counseling provided  Son stated that he will work with pt to to quit  Other Stroke Risk Factors  Advanced age  CAD, mild  Other Active Problems  CHF secondary to preserved LV systolic function  GERD on protonix  Hospital day # 2   Rosalin Hawking, MD PhD Stroke Neurology 04/30/2015 11:27 AM    To contact Stroke Continuity provider, please refer to [email or web address] After hours, contact General Neurology

## 2015-04-30 NOTE — Progress Notes (Signed)
Inpatient Diabetes Program Recommendations  AACE/ADA: New Consensus Statement on Inpatient Glycemic Control (2015)  Target Ranges:  Prepandial:   less than 140 mg/dL      Peak postprandial:   less than 180 mg/dL (1-2 hours)      Critically ill patients:  140 - 180 mg/dL   Review of Glycemic Control  Patient has a history of DM 2 but was not on medication at home. A1c is 11.6% on 10/25. At this level patient may need to be on dual therapy of Metformin (renal function is normal as well as liver function) and Basal insulin of Levemir or Lantus insulin 15 units Q24hrs at time of discharge.  Thanks,  Tama Headings RN, MSN, [facility] Inpatient Diabetes Coordinator Team Pager [phone] (8a-5p)

## 2015-04-30 NOTE — Progress Notes (Signed)
Patient currently not at a level to be able to participate in an intense inpt rehab admission. I will follow her progress to assist in planning dispo. [phone]

## 2015-04-30 NOTE — Evaluation (Signed)
Occupational Therapy Evaluation Patient Details Name: [name] MRN: [id number] DOB: 1946-01-19 Today's Date: 04/30/2015    History of Present Illness Adm 10/24 with rt sided weakness; MRI + large lt ACA CVA   PMHx-CHF, CAD, HTN, tobacco use, DM, HTN   Clinical Impression   Pt was apparently independent prior to admission and living with her granddaughter.  Presents with lethargy, more pronounced today vs yesterday per PT and nursing, dense R hemiparesis, L gaze preference, decreased balance in sitting and standing and dependence in ADL Will need intensive rehab prior to return home. Will follow acutely.   Follow Up Recommendations  CIR;Supervision/Assistance - 24 hour    Equipment Recommendations  Other (comment) (to be determined)    Recommendations for Other Services       Precautions / Restrictions Precautions Precautions: Fall Restrictions Weight Bearing Restrictions: No      Mobility Bed Mobility Overal bed mobility: Needs Assistance Bed Mobility: Rolling;Sidelying to Sit Rolling: Mod assist Sidelying to sit: Max assist;+2 for safety/equipment       General bed mobility comments: incr lethargy requiring incr assist  Transfers Overall transfer level: Needs assistance Equipment used: None Transfers: Sit to/from [employer] Transfers Sit to Stand: Mod assist Stand pivot transfers: Mod assist;+2 safety/equipment;+2 physical assistance       General transfer comment: once in weight bearing, pt with +hip and knee extension (at times maintained full knee extension without RLE support vs slow buckling with decr attention to task); pivoted to her Lt advancing her LLE and attempted to move RLE    Balance Overall balance assessment: Needs assistance Sitting-balance support: No upper extremity supported;Feet supported Sitting balance-Leahy Scale: Zero Sitting balance - Comments: at times pushing with LUE causing fall towards Rt; if LUE unsupported, she  leans to Rt due to trunk weakness Postural control: Right lateral lean Standing balance support: Single extremity supported Standing balance-Leahy Scale: Poor Standing balance comment: posterior and Rt lean                            ADL Overall ADL's : Needs assistance/impaired Eating/Feeding: Maximal assistance;Sitting Eating/Feeding Details (indicate cue type and reason): pt able to make food choices and bring loaded fork and cup to mouth, max verbal cues to remain alert Grooming: Wash/dry face;Minimal assistance;Sitting Grooming Details (indicate cue type and reason): with L hand Upper Body Bathing: Sitting;Total assistance   Lower Body Bathing: Total assistance;+2 for physical assistance;Sit to/from stand   Upper Body Dressing : Sitting;Maximal assistance   Lower Body Dressing: +2 for physical assistance;Total assistance;Sit to/from stand   Toilet Transfer: +2 for physical assistance;Maximal assistance;Stand-pivot Toilet Transfer Details (indicate cue type and reason): simulated to chair Toileting- Clothing Manipulation and Hygiene: +2 for physical assistance;Total assistance;Sit to/from stand               Vision Vision Assessment?: Yes Eye Alignment: Within Functional Limits Alignment/Gaze Preference: Gaze left (but able to look L with verbal cues) Visual Fields: Impaired-to be further tested in functional context Additional Comments: difficulty to formally assess due to lethargy, able to visually locate items on meal tray and make choices   Perception     Praxis      Pertinent Vitals/Pain Pain Assessment: Faces Faces Pain Scale: No hurt     Hand Dominance Right   Extremity/Trunk Assessment Upper Extremity Assessment Upper Extremity Assessment: RUE deficits/detail RUE Deficits / Details: trace elbow flexion RUE Coordination: decreased fine motor;decreased gross motor  Lower Extremity Assessment Lower Extremity Assessment: Defer to PT  evaluation   Cervical / Trunk Assessment Cervical / Trunk Assessment: Other exceptions Cervical / Trunk Exceptions: flexed posture with R lean    Communication Communication Communication: Expressive difficulties   Cognition Arousal/Alertness: Lethargic Behavior During Therapy: Flat affect Overall Cognitive Status: Difficult to assess Area of Impairment: Orientation;Attention;Following commands Orientation Level: Disoriented to;Time;Situation Current Attention Level: Sustained   Following Commands: Follows one step commands inconsistently           General Comments       Exercises       Shoulder Instructions      Home Living Family/patient expects to be discharged to:: Unsure   Available Help at Discharge: Family                                Lives With: Other (Comment) (granddaughter)    Prior Functioning/Environment Level of Independence: Independent        Comments: per RN    OT Diagnosis: Generalized weakness;Cognitive deficits;Disturbance of vision;Hemiplegia dominant side   OT Problem List: Decreased strength;Decreased activity tolerance;Impaired balance (sitting and/or standing);Impaired vision/perception;Decreased coordination;Decreased cognition;Decreased safety awareness;Decreased knowledge of use of DME or AE;Impaired tone;Impaired UE functional use;Decreased range of motion   OT Treatment/Interventions: Self-care/ADL training;Neuromuscular education;DME and/or AE instruction;Therapeutic activities;Cognitive remediation/compensation;Patient/family education;Balance training;Visual/perceptual remediation/compensation    OT Goals(Current goals can be found in the care plan section) Acute Rehab OT Goals Patient Stated Goal: unable to state due to lethargy; decr speech OT Goal Formulation: Patient unable to participate in goal setting Time For Goal Achievement: 05/14/15 Potential to Achieve Goals: Fair ADL Goals Pt Will Perform Eating:  with supervision;sitting;with set-up Pt Will Perform Grooming: sitting;with set-up;with min assist (locating ADL items on L side with min VCs) Pt Will Perform Upper Body Bathing: with min assist;sitting Pt Will Perform Upper Body Dressing: with min assist;sitting Pt Will Perform Lower Body Dressing: with min assist;sit to/from stand Pt Will Transfer to Toilet: with min assist;stand pivot transfer;bedside commode Pt Will Perform Toileting - Clothing Manipulation and hygiene: with min assist;sit to/from stand Additional ADL Goal #1: Pt will perform bed mobility with min assist in preparation for ADL. Additional ADL Goal #2: Pt will sit EOB x 10 minutes with supervision as a precursor to ADL.  OT Frequency: Min 3X/week   Barriers to D/C:            Co-evaluation PT/OT/SLP Co-Evaluation/Treatment: Yes Reason for Co-Treatment: Complexity of the patient's impairments (multi-system involvement);For patient/therapist safety PT goals addressed during session: Mobility/safety with mobility;Balance;Strengthening/ROM OT goals addressed during session: ADL's and self-care      End of Session Equipment Utilized During Treatment: Gait belt Nurse Communication: Mobility status (per nursing, pt more lethargic, less conversive today)  Activity Tolerance: Patient limited by lethargy Patient left: in chair;with call bell/phone within reach;with chair alarm set;with nursing/sitter in room   Time: [id number] OT Time Calculation (min): 50 min Charges:  OT General Charges $OT Visit: 1 Procedure OT Evaluation $Initial OT Evaluation Tier I: 1 Procedure OT Treatments $Self Care/Home Management : 8-22 mins G-Codes:    Malka So 04/30/2015, 10:53 AM  [phone]

## 2015-05-01 ENCOUNTER — Inpatient Hospital Stay (HOSPITAL_COMMUNITY): Payer: Medicare Other

## 2015-05-01 DIAGNOSIS — R739 Hyperglycemia, unspecified: Secondary | ICD-10-CM

## 2015-05-01 LAB — GLUCOSE, CAPILLARY
Glucose-Capillary: 232 mg/dL — ABNORMAL HIGH (ref 65–99)
Glucose-Capillary: 162 mg/dL — ABNORMAL HIGH (ref 65–99)
Glucose-Capillary: 151 mg/dL — ABNORMAL HIGH (ref 65–99)
Glucose-Capillary: 235 mg/dL — ABNORMAL HIGH (ref 65–99)

## 2015-05-01 MED ORDER — INSULIN GLARGINE 100 UNIT/ML ~~LOC~~ SOLN
20.0000 [IU] | Freq: Every day | SUBCUTANEOUS | Status: DC
  Administered 2015-05-01 – 2015-05-05 (×5): 20 [IU] via SUBCUTANEOUS
  Filled 2015-05-01 (×7): qty 0.2

## 2015-05-01 MED ORDER — ACETAMINOPHEN 325 MG PO TABS
650.0000 mg | ORAL_TABLET | Freq: Three times a day (TID) | ORAL | Status: DC | PRN
  Administered 2015-05-03: 650 mg via ORAL
  Filled 2015-05-01: qty 2

## 2015-05-01 NOTE — Progress Notes (Signed)
STROKE TEAM PROGRESS NOTE   SUBJECTIVE (INTERVAL HISTORY) Son is at beside. Pt sleeping initially but woke up during rounds after stimulation. Decreased speech output this am, but follows commands. Left UE weaker than before, left LE still plegic as before. Will do MRI.     OBJECTIVE Temp:  [97.5 F (36.4 C)-98.7 F (37.1 C)] 98.7 F (37.1 C) (10/27 1046) Pulse Rate:  [59-67] 67 (10/27 1046) Cardiac Rhythm:  [-] Normal sinus rhythm (10/27 0753) Resp:  [15-20] 20 (10/27 1046) BP: (118-136)/(71-92) 132/92 mmHg (10/27 1046) SpO2:  [98 %-100 %] 98 % (10/27 1046)  CBC:   Recent Labs Lab 04/28/15 1414 04/28/15 1420 04/28/15 2000  WBC 9.4  --  10.2  NEUTROABS 6.1  --   --   HGB 13.4 15.0 14.5  HCT 41.0 44.0 44.4  MCV 85.2  --  85.2  PLT 210  --  272    Basic Metabolic Panel:   Recent Labs Lab 04/28/15 1414 04/28/15 1420 04/28/15 2000  NA 139 137 141  K 4.0 3.9 3.7  CL 106 106 105  CO2 21*  --  28  GLUCOSE 380* 381* 221*  BUN 10 12 8   CREATININE 0.97 0.90 0.85  CALCIUM 9.3  --  9.9    Lipid Panel:     Component Value Date/Time   CHOL 212* 04/29/2015 0439   TRIG 236* 04/29/2015 0439   HDL 44 04/29/2015 0439   CHOLHDL 4.8 04/29/2015 0439   VLDL 47* 04/29/2015 0439   LDLCALC 121* 04/29/2015 0439   HgbA1c:  Lab Results  Component Value Date   HGBA1C 11.6* 04/29/2015   Urine Drug Screen:     Component Value Date/Time   LABOPIA NONE DETECTED 04/29/2015 1209   COCAINSCRNUR NONE DETECTED 04/29/2015 1209   LABBENZ NONE DETECTED 04/29/2015 1209   AMPHETMU NONE DETECTED 04/29/2015 1209   THCU NONE DETECTED 04/29/2015 1209   LABBARB NONE DETECTED 04/29/2015 1209      IMAGING I have personally reviewed the radiological images below and agree with the radiology interpretations.  Dg Chest 2 View 04/28/2015  No active cardiopulmonary disease.   Ct Head Wo Contrast 04/30/2015 1. Acute left ACA territory infarcts without evidence of progression. No interval  finding to explain new deficit. 2. Tiny hyperintensity in the posterior left cerebral cortex, favor volume-averaging over petechial hemorrhage. 04/28/2015  No acute abnormality.   Mr Brain Ltd W/o Cm  05/01/2015  IMPRESSION: 1. Stable size and extent of left ACA territory nonhemorrhagic infarct with expected evolution of T2 change. 2. Stable atrophy and T2 white matter changes otherwise. 3. No significant interval change otherwise.  MRI HEAD 04/29/2015   Acute large LEFT anterior cerebral artery territory infarct without hemorrhagic conversion. Moderate chronic small vessel ischemic disease.   MRA HEAD 04/29/2015   Acute LEFT A2 occlusion compatible with thromboembolic disease. Moderate to high-grade stenosis RIGHT A2 segment.   CUS - Bilateral: 1-39% ICA stenosis. Vertebral artery flow is antegrade.  TTE - Left ventricle: The cavity size was normal. Systolic function wasnormal. The estimated ejection fraction was in the range of 50%to 55%. Doppler parameters are consistent with abnormal leftventricular relaxation (grade 1 diastolic dysfunction). Atrial septum: No defect or patent foramen ovale was identified. Pericardium, extracardiac: A trivial pericardial effusion wasidentified. Features were not consistent with tamponadephysiology. Impressions: No cardiac source of embolism was identified, but cannot be ruledout on the basis of this examination.   PHYSICAL EXAM General - obesity, well developed, lethargic, abulia, expressive aphasia.  Ophthalmologic - Fundi not visualized due to noncooperation.  Cardiovascular - Regular rate and rhythm.  Mental Status -  Awake, alert, not answer orientation questions. Language showed expressive aphasia, able to follow simple commands, not able to name or repeat.   Cranial Nerves II - XII - II - Visual field intact OU. III, IV, VI - Extraocular movements intact. V - Facial sensation intact bilaterally. VII - right facial droop. VIII -  Hearing & vestibular intact bilaterally. X - not cooperative on exam. XI - not cooperative on exam. XII - Tongue protrusion intact.  Motor Strength - The patient's strength exam not cooperative, but RUE1/5, RLE 1/5. spontaneous movement at LUE and LLE.  Bulk was normal and fasciculations were absent.   Motor Tone - Muscle tone was assessed at the neck and appendages and was normal.  Reflexes - The patient's reflexes were 1+ in all extremities and she had no pathological reflexes.  Sensory - Light touch, temperature/pinprick were assessed and not cooperative, but respond to pain stimulation.    Coordination - not cooperative on exam.  Tremor was absent.  Gait and Station - not tested.    ASSESSMENT/PLAN [name] is a 69 y.o. female with history of HTN, DM and CHF presenting with right arm and leg weakness associated with slurred speech and confusion. She did not receive IV t-PA due to delay in arrival.   Stroke:  Dominant  L ACA infarct in setting of A2 occlusion, possible large vessel athero. Embolic stroke not able to completely ruled out. Neuro worsening noted this am.  Resultant  Right hemiparesis, dysarthria, abulia -> worsening RUE hemiparesis today unable to lift off bed, not talking at all, but following simple commands   MRI  L ACA infarct  Repeat MRI showed stable L ACA infarct, no new area infarct.  MRA  L A2 occlusion, high grade stenosis R A2  Carotid Doppler  No significant stenosis   2D Echo  unremarkable  LDL 121  HgbA1c 11.6  Heparin 5000 units sq tid for VTE prophylaxis DIET DYS 2 Room service appropriate?: Yes; Fluid consistency:: Thin  aspirin 81 mg daily prior to admission, changed to plavix. Cardiology suggests dual antiplatelet for both stroke and cardiac prevention, we agree with that. Now on both.  Patient counseled to be compliant with her antithrombotic medications  Recommend OP tele monitoring to look for atrial fibrillation as source  of stroke  Ongoing aggressive stroke risk factor management  Therapy recommendations:  CIR  Disposition:  pending   Worsening left UE weakness with lack of speech output  Repeat MRI did not show new area infarct  Still left ACA infarct  Likely abulia, akinetic mutism due to frontal infarct   Continue PT/OT  Encourage more stimulation and communication  Hypertension  Home meds: norvasc 5 and metoprolol 50mg   Stable  On norvasc 5mg  and metoprolol 50mg   BP goal gradually normalize   Hyperlipidemia  Home meds:  lipitor 10, resumed in hospital  LDL 121, goal < 70  Increased lipitor to 40mg   Continue statin at discharge  Diabetes type II, Uncontrolled  HgbA1c 11.6, goal < 7.0  On lantus. Increased per diabetic RN recommendation  SSI  Tobacco abuse  Current smoker  Smoking cessation counseling provided  Son stated that he will work with pt to to quit  Other Stroke Risk Factors  Advanced age  CAD, mild  Other Active Problems  CHF secondary to preserved LV systolic function  GERD on protonix  Hospital day # 3  Rosalin Hawking, MD PhD Stroke Neurology 05/01/2015 3:22 PM    To contact Stroke Continuity provider, please refer to [email or web address] After hours, contact General Neurology

## 2015-05-01 NOTE — Progress Notes (Signed)
Inpatient Diabetes Program Recommendations  AACE/ADA: New Consensus Statement on Inpatient Glycemic Control (2015)  Target Ranges:  Prepandial:   less than 140 mg/dL      Peak postprandial:   less than 180 mg/dL (1-2 hours)      Critically ill patients:  140 - 180 mg/dL   Results for [name], [name] (MRN [id number]) as of 05/01/2015 08:52  Ref. Range 04/30/2015 06:44 04/30/2015 11:02 04/30/2015 16:15 04/30/2015 21:28 05/01/2015 06:33  Glucose-Capillary Latest Ref Range: 65-99 mg/dL 187 (H) 217 (H) 196 (H) 211 (H) 232 (H)   Review of Glycemic Control  Diabetes history: DM 2 Outpatient Diabetes medications: None Current orders for Inpatient glycemic control: Lantus 15 units QHS, Novolog Sensitive TID  Inpatient Diabetes Program Recommendations: Insulin - Basal: Fasting glucose this am 232 mg/dl. Please consider increasing basal insulin to 20 units QHS.   Thanks,  Tama Headings RN, MSN, [facility] Inpatient Diabetes Coordinator Team Pager [phone] (8a-5p)

## 2015-05-01 NOTE — Progress Notes (Signed)
Occupational Therapy Treatment Patient Details Name: [name] MRN: [id number] DOB: 1946/01/10 Today's Date: 05/01/2015    History of present illness Adm 10/24 with rt sided weakness; MRI + large lt ACA CVA   PMHx-CHF, CAD, HTN, tobacco use, DM, HTN   OT comments  Pt with continued lethargy.  Focus of session on self feeding, simple grooming, increasing regard for R visual field and UE, and family education.  Pt following commands with increased time when she is alert. Son with many questions about prognosis, deferred to MD.  Follow Up Recommendations  CIR;Supervision/Assistance - 24 hour    Equipment Recommendations       Recommendations for Other Services      Precautions / Restrictions Precautions Precautions: Fall Restrictions Weight Bearing Restrictions: No       Mobility Bed Mobility Overal bed mobility: Needs Assistance Bed Mobility: Rolling Rolling: Mod assist         General bed mobility comments: rolled for positioning, pt indicating discomfort with facial grimace, +2 total assist to pull up in bed  Transfers                      Balance                                   ADL Overall ADL's : Needs assistance/impaired Eating/Feeding: Maximal assistance;Bed level Eating/Feeding Details (indicate cue type and reason): pt given hand over hand assist for scooping food, brought to mouth without assist and drank from straw independently. Grooming: Dance movement psychotherapist;Moderate assistance;Bed level Grooming Details (indicate cue type and reason): with L hand         Upper Body Dressing : Maximal assistance;Bed level                     General ADL Comments: Educated son to talk to pt from R side and to hold R hand to increase pt's regard and head turn.        Vision Eye Alignment: Within Functional Limits Alignment/Gaze Preference: Gaze left             Additional Comments: more difficulty turning eyes toward R side  today with head turn   Perception     Praxis      Cognition   Behavior During Therapy: Flat affect Overall Cognitive Status: Difficult to assess (smiled x 2 ) Area of Impairment: Attention;Following commands   Current Attention Level: Sustained    Following Commands: Follows one step commands consistently            Extremity/Trunk Assessment               Exercises  AAROM R elbow with facilitation, PROM R shoulder to hand   Shoulder Instructions       General Comments      Pertinent Vitals/ Pain       Pain Assessment: Faces Faces Pain Scale: No hurt  Home Living                                          Prior Functioning/Environment              Frequency Min 3X/week     Progress Toward Goals  OT Goals(current goals can now be found in the care plan section)  Progress towards OT goals: Not progressing toward goals - comment (remains lethargic)     Plan Discharge plan remains appropriate    Co-evaluation                 End of Session     Activity Tolerance Patient limited by lethargy   Patient Left in bed;with call bell/phone within reach;with family/visitor present   Nurse Communication          Time: 8115-7262 OT Time Calculation (min): 28 min  Charges: OT General Charges $OT Visit: 1 Procedure OT Treatments $Self Care/Home Management : 8-22 mins $Neuromuscular Re-education: 8-22 mins  Malka So 05/01/2015, 11:29 AM  [phone]

## 2015-05-01 NOTE — Progress Notes (Signed)
Subjective:  Patient remains drowsy and does not follow commands repeat CT done showed evolving left ACA infarct no progression or expansion or hemorrhagic conversion. As per son ate breakfast earlier today  Objective:  Vital Signs in the last 24 hours: Temp:  [97.5 F (36.4 C)-98.6 F (37 C)] 97.5 F (36.4 C) (10/27 7341) Pulse Rate:  [59-66] 64 (10/26 2126) Resp:  [15-18] 18 (10/27 0642) BP: (118-136)/(71-89) 134/74 mmHg (10/27 0642) SpO2:  [98 %-100 %] 98 % (10/27 0642)  Intake/Output from previous day: 10/26 0701 - 10/27 0700 In: 240 [P.O.:240] Out: -  Intake/Output from this shift:    Physical Exam: Neck: no adenopathy, no carotid bruit, no JVD and supple, symmetrical, trachea midline Lungs: Clear to auscultation anteriorly Heart: regular rate and rhythm, S1, S2 normal and Soft systolic murmur noted Abdomen: soft, non-tender; bowel sounds normal; no masses,  no organomegaly Extremities: extremities normal, atraumatic, no cyanosis or edema Neurologic: Mental status: Drowsy and sleepy able to move left-side Motor strength 2 over 5 in right upper and lower extremity pronator drift Lab Results:  Recent Labs  04/28/15 1414 04/28/15 1420 04/28/15 2000  WBC 9.4  --  10.2  HGB 13.4 15.0 14.5  PLT 210  --  245    Recent Labs  04/28/15 1414 04/28/15 1420 04/28/15 2000  NA 139 137 141  K 4.0 3.9 3.7  CL 106 106 105  CO2 21*  --  28  GLUCOSE 380* 381* 221*  BUN 10 12 8   CREATININE 0.97 0.90 0.85   No results for input(s): TROPONINI in the last 72 hours.  Invalid input(s): CK, MB Hepatic Function Panel  Recent Labs  04/28/15 2000  PROT 6.8  ALBUMIN 4.1  AST 17  ALT 17  ALKPHOS 81  BILITOT 0.7    Recent Labs  04/29/15 0439  CHOL 212*   No results for input(s): PROTIME in the last 72 hours.  Imaging: Imaging results have been reviewed and Ct Head Wo Contrast  04/30/2015  CLINICAL DATA:  Status post CVA.  Subsequent encounter. EXAM: CT HEAD WITHOUT  CONTRAST TECHNIQUE: Contiguous axial images were obtained from the base of the skull through the vertex without intravenous contrast. COMPARISON:  CT of the head performed earlier today at 8:18 a.m. FINDINGS: An evolving acute left ACA territory infarct is relatively unchanged from the recent prior study. No significant mass effect or midline shift is seen. There is no evidence of hemorrhagic transformation. No mass lesion is seen. The posterior fossa, including the cerebellum, brainstem and fourth ventricle, is within normal limits. The third and lateral ventricles, and basal ganglia are unremarkable in appearance. There is no evidence of fracture; visualized osseous structures are unremarkable in appearance. The orbits are within normal limits. The paranasal sinuses and mastoid air cells are well-aerated. No significant soft tissue abnormalities are seen. IMPRESSION: Evolving acute left ACA territory infarct is relatively unchanged from the recent prior study. No evidence of hemorrhagic transformation. Electronically Signed   By: Garald Balding M.D.   On: 04/30/2015 22:50   Ct Head Wo Contrast  04/30/2015  CLINICAL DATA:  Aphasia with inability to speak this morning. EXAM: CT HEAD WITHOUT CONTRAST TECHNIQUE: Contiguous axial images were obtained from the base of the skull through the vertex without intravenous contrast. COMPARISON:  Two days ago FINDINGS: Skull and Sinuses:No acute or new finding. Orbits: Negative. Brain: Recently characterize infarcts in the left ACA distribution show no evidence of progression. 4 mm area of high density in  the posterior left cerebral hemisphere on series 2, image 20 is near a cortical infarct based on MRI; favor volume-averaging over petechial hemorrhage. No new vascular hyperdensity. No hydrocephalus or shift. IMPRESSION: 1. Acute left ACA territory infarcts without evidence of progression. No interval finding to explain new deficit. 2. Tiny hyperintensity in the posterior  left cerebral cortex, favor volume-averaging over petechial hemorrhage. Electronically Signed   By: Monte Fantasia M.D.   On: 04/30/2015 08:50    Cardiac Studies:  Assessment/Plan:   Evolving left ACA infarct in the setting of a dual occlusion possible large vessel atheroma cannot rule out embolic event. May consider TEE once patient more awake and follows, and to rule out cardiac source of emboli Moderate to high-grade stenosis right A2 segment.  Mild coronary artery disease in the past.  Uncontrolled hypertension.  Uncontrolled diabetes mellitus.  Compensated congestive heart failure secondary to preserved LV systolic function.  History of tobacco abuse.  Hyperlipidemia. Plan Continue present dual antiplatelets medications Will consider TEE once patient more awake and able to tolerate the procedure and follow commands. Check labs in a.m.   LOS: 3 days    Charolette Forward 05/01/2015, 10:17 AM

## 2015-05-02 DIAGNOSIS — I63522 Cerebral infarction due to unspecified occlusion or stenosis of left anterior cerebral artery: Principal | ICD-10-CM

## 2015-05-02 DIAGNOSIS — R531 Weakness: Secondary | ICD-10-CM

## 2015-05-02 LAB — CBC
HEMATOCRIT: 43.2 % (ref 36.0–46.0)
Hemoglobin: 14.1 g/dL (ref 12.0–15.0)
MCH: 28 pg (ref 26.0–34.0)
MCHC: 32.6 g/dL (ref 30.0–36.0)
MCV: 85.9 fL (ref 78.0–100.0)
Platelets: 225 10*3/uL (ref 150–400)
RBC: 5.03 MIL/uL (ref 3.87–5.11)
RDW: 13.2 % (ref 11.5–15.5)
WBC: 9.1 10*3/uL (ref 4.0–10.5)

## 2015-05-02 LAB — BASIC METABOLIC PANEL
Anion gap: 12 (ref 5–15)
BUN: 10 mg/dL (ref 6–20)
CHLORIDE: 105 mmol/L (ref 101–111)
CO2: 23 mmol/L (ref 22–32)
CREATININE: 0.96 mg/dL (ref 0.44–1.00)
Calcium: 9.9 mg/dL (ref 8.9–10.3)
GFR calc non Af Amer: 59 mL/min — ABNORMAL LOW (ref >60)
Glucose, Bld: 222 mg/dL — ABNORMAL HIGH (ref 65–99)
POTASSIUM: 3.7 mmol/L (ref 3.5–5.1)
SODIUM: 140 mmol/L (ref 135–145)

## 2015-05-02 LAB — GLUCOSE, CAPILLARY
GLUCOSE-CAPILLARY: 250 mg/dL — AB (ref 65–99)
GLUCOSE-CAPILLARY: 238 mg/dL — AB (ref 65–99)
GLUCOSE-CAPILLARY: 229 mg/dL — AB (ref 65–99)
Glucose-Capillary: 186 mg/dL — ABNORMAL HIGH (ref 65–99)

## 2015-05-02 NOTE — Progress Notes (Signed)
Subjective:  Appreciate neurology help. Patient more awake today follows command but speech minimal repeat limited MRI no extension of infarct Objective:  Vital Signs in the last 24 hours: Temp:  [97.9 F (36.6 C)-98.7 F (37.1 C)] 98.7 F (37.1 C) (10/28 0951) Pulse Rate:  [50-64] 64 (10/28 0951) Resp:  [16-20] 16 (10/28 0951) BP: (132-147)/(65-72) 132/69 mmHg (10/28 0951) SpO2:  [96 %-100 %] 100 % (10/28 0951)  Intake/Output from previous day:   Intake/Output from this shift: Total I/O In: 240 [P.O.:240] Out: -   Physical Exam: Neck: no adenopathy, no carotid bruit, no JVD and supple, symmetrical, trachea midline Lungs: Clear anterolaterally Heart: regular rate and rhythm, S1, S2 normal and Soft systolic murmur noted Abdomen: soft, non-tender; bowel sounds normal; no masses,  no organomegaly Extremities: extremities normal, atraumatic, no cyanosis or edema Neuro unchanged Lab Results:  Recent Labs  05/02/15 0236  WBC 9.1  HGB 14.1  PLT 225    Recent Labs  05/02/15 0236  NA 140  K 3.7  CL 105  CO2 23  GLUCOSE 222*  BUN 10  CREATININE 0.96   No results for input(s): TROPONINI in the last 72 hours.  Invalid input(s): CK, MB Hepatic Function Panel No results for input(s): PROT, ALBUMIN, AST, ALT, ALKPHOS, BILITOT, BILIDIR, IBILI in the last 72 hours. No results for input(s): CHOL in the last 72 hours. No results for input(s): PROTIME in the last 72 hours.  Imaging: Imaging results have been reviewed and Ct Head Wo Contrast  04/30/2015  CLINICAL DATA:  Status post CVA.  Subsequent encounter. EXAM: CT HEAD WITHOUT CONTRAST TECHNIQUE: Contiguous axial images were obtained from the base of the skull through the vertex without intravenous contrast. COMPARISON:  CT of the head performed earlier today at 8:18 a.m. FINDINGS: An evolving acute left ACA territory infarct is relatively unchanged from the recent prior study. No significant mass effect or midline shift is  seen. There is no evidence of hemorrhagic transformation. No mass lesion is seen. The posterior fossa, including the cerebellum, brainstem and fourth ventricle, is within normal limits. The third and lateral ventricles, and basal ganglia are unremarkable in appearance. There is no evidence of fracture; visualized osseous structures are unremarkable in appearance. The orbits are within normal limits. The paranasal sinuses and mastoid air cells are well-aerated. No significant soft tissue abnormalities are seen. IMPRESSION: Evolving acute left ACA territory infarct is relatively unchanged from the recent prior study. No evidence of hemorrhagic transformation. Electronically Signed   By: Garald Balding M.D.   On: 04/30/2015 22:50   Dg Chest Port 1v Same Day  05/01/2015  CLINICAL DATA:  History of CHF, diabetes and hypertension, walking corpse syndrome, left-sided CVA EXAM: PORTABLE CHEST 1 VIEW COMPARISON:  PA and lateral chest x-ray of April 28, 2015 FINDINGS: The lungs are adequately inflated. There is no focal infiltrate. The cardiac silhouette is mildly enlarged. The pulmonary vascularity is not engorged. The mediastinum is normal in width. There is mild stable tortuosity of the ascending and descending thoracic aorta. The observed bony thorax is unremarkable. IMPRESSION: Mild stable enlargement of the cardiac silhouette without pulmonary edema or other acute cardiopulmonary abnormality. Electronically Signed   By: David  Martinique M.D.   On: 05/01/2015 16:55   Mr Brain Ltd W/o Cm  05/01/2015  CLINICAL DATA:  Expressive a aphasia and right hemi paresis. Question extension a previous infarct. The examination had to be discontinued prior to completion due to patient refused further imaging. EXAM: MRI  HEAD WITHOUT CONTRAST TECHNIQUE: Multiplanar, multiecho pulse sequences of the brain and surrounding structures were obtained without intravenous contrast. COMPARISON:  CT head without contrast 04/30/2015. MRI  brain 04/29/2015. FINDINGS: The left ACA territory infarct is stable and size and configuration. There is no significant extension of the infarct. No hemorrhage is present. T2 changes are as expected. Periventricular and subcortical T2 changes are stable. Flow is present in the major intracranial arteries. The globes and orbits are intact. The paranasal sinuses and mastoid air cells are clear. Congenital fusion C2-3 is again noted. Midline structures are otherwise unremarkable. IMPRESSION: 1. Stable size and extent of left ACA territory nonhemorrhagic infarct with expected evolution of T2 change. 2. Stable atrophy and T2 white matter changes otherwise. 3. No significant interval change otherwise. Electronically Signed   By: San Morelle M.D.   On: 05/01/2015 12:59    Cardiac Studies:  Assessment/Plan:  Evolving left ACA infarct in the setting of a dual occlusion possible large vessel atheroma cannot rule out embolic event. May consider TEE once patient more awake and follows, and to rule out cardiac source of emboli Moderate to high-grade stenosis right A2 segment.  Mild coronary artery disease in the past.  Uncontrolled hypertension.  Uncontrolled diabetes mellitus.  Compensated congestive heart failure secondary to preserved LV systolic function.  History of tobacco abuse.  Hyperlipidemia. Plan Continue present dual antiplatelets medications Increase Lantus insulin as per orders Will consider TEE once patient more awake and able to tolerate the procedure and follow commands. Dr. Doylene Canard on call for weekend  LOS: 4 days    Charolette Forward 05/02/2015, 12:20 PM

## 2015-05-02 NOTE — Care Management Note (Signed)
Case Management Note  Patient Details  Name: REVECA DESMARAIS MRN: [id number] Date of Birth: 1945-10-02  Subjective/Objective:                    Action/Plan: Patient currently is not a candidate for CIR. CSW made aware. CM will continue to follow for discharge needs.   Expected Discharge Date:                  Expected Discharge Plan:  [place]  In-House Referral:     Discharge planning Services     Post Acute Care Choice:    Choice offered to:     DME Arranged:    DME Agency:     HH Arranged:    [place] Agency:     Status of Service:  In process, will continue to follow  Medicare Important Message Given:    Date Medicare IM Given:    Medicare IM give by:    Date Additional Medicare IM Given:    Additional Medicare Important Message give by:     If discussed at [place] of Stay Meetings, dates discussed:    Additional Comments:  Pollie Friar, RN 05/02/2015, 11:38 AM

## 2015-05-02 NOTE — Progress Notes (Signed)
I received call from pt's son, [name], to discuss rehab venue options for his Mom. He plans to drive by pt's spouse's home to discuss our rehab venue discussions. I expressed that pt's spouse is her legal decision maker unless he defers to her children. I will follow her progress over the weekend, but pt currently at SNF level. Spouse does not have a phone. [phone]

## 2015-05-02 NOTE — Progress Notes (Signed)
I met with pt at bedside with her brother and sister at bedside. I confirmed that pt was married with children. Spouse does not have a phone. I contacted her son, Nicole Kindred, by phone to discuss pt's rehab options pending her ability to participate and for 24/7 caregiver support to be arranged. Nicole Kindred and sister state pt is unable to return home with her spouse due to his alcoholism issues and Nicole Kindred does not have 24/7 care clarified at this time. Nicole Kindred states he will discuss with his brother and two sisters and let me know how to proceed. Nicole Kindred states spouse does not have a phone that I can reach him at. Pt likely will need SNF rehab until her ability to participate improves and 24/7 caregiver support can be arranged. I will follow up after Nicole Kindred call me make and I will alert RN CM and SW. [phone]

## 2015-05-02 NOTE — Progress Notes (Signed)
Speech Language Pathology Treatment: Dysphagia  Patient Details Name: [name] MRN: [id number] DOB: January 22, 1946 Today's Date: 05/02/2015 Time: 1015-1040 SLP Time Calculation (min) (ACUTE ONLY): 25 min  Assessment / Plan / Recommendation Clinical Impression  Patient seen to assess toleration of current diet consistencies of thin liquids and Dys 2 solids. Daughter and grandson present in room, and stated that patient had a coughing episode when having breakfast tray this morning. SLP provided oral care prior to administering P.O.'s, and patient noted with appearance of oral thrush on tongue. Patient did not exhibit any overt s/s of aspiration with thin liquids, puree solids, or dysphagia 3 solids, however she exhibited significant delays in oral manipulation, bolus formation and transit, as well as mastication. Patient is lethargic, and this likely greatly impacts her swallow. She did perform lingual sweep when cued, however her lingual ROM and strength were decreased. Trace to minimal residuals of solid texture bolus were observed in left and right buccal cavities post initial swallow. Residuals cleared with liquid wash and cued lingual sweep.   HPI Other Pertinent Information: [name] is a 69 y.o. female with known HTN, DM and CHF who presents with right sided weakness and slurred speech. MRI shows acute large left ACA territory infarct without hemorrhagic conversion.   Pertinent Vitals    SLP Plan  Continue with current plan of care    Recommendations Diet recommendations: Dysphagia 1 (puree);Thin liquid Liquids provided via: Straw;Cup Medication Administration: Crushed with puree Supervision: Patient able to self feed;Full supervision/cueing for compensatory strategies;Staff to assist with self feeding Compensations: Minimize environmental distractions;Slow rate;Small sips/bites;Follow solids with liquid Postural Changes and/or Swallow Maneuvers: Seated upright 90 degrees;Upright  30-60 min after meal              Oral Care Recommendations: Oral care BID Follow up Recommendations: Inpatient Rehab Plan: Continue with current plan of care    [place], [place], CCC-SLP 05/02/2015 2:48 PM

## 2015-05-02 NOTE — Progress Notes (Signed)
Physical Therapy Treatment Patient Details Name: NICKOL COLLISTER MRN: [id number] DOB: 1946/03/02 Today's Date: 05/02/2015    History of Present Illness Adm 10/24 with rt sided weakness; MRI + large lt ACA CVA; multiple episodes of decline in status with repeat head CT or MRI's with no further acute changes found  PMHx-CHF, CAD, HTN, tobacco use, DM, HTN    PT Comments    Patient requiring even more assist to sit/balance at EOB and had no supporting reactions in RLE upon standing (required full support to prevent buckling). Lt gaze preference stronger today as well.   Follow Up Recommendations  CIR     Equipment Recommendations  Other (comment) (TBA)    Recommendations for Other Services       Precautions / Restrictions Precautions Precautions: Fall    Mobility  Bed Mobility Overal bed mobility: Needs Assistance Bed Mobility: Rolling;Sidelying to Sit Rolling: Mod assist Sidelying to sit: Max assist;+2 for safety/equipment       General bed mobility comments: exit to Rt side of bed due to family/visitors; incr lethargy requiring incr assist  Transfers Overall transfer level: Needs assistance Equipment used: None Transfers: Stand Pivot Transfers Sit to Stand: Max assist;+2 physical assistance;+2 safety/equipment Stand pivot transfers: +2 safety/equipment;+2 physical assistance;Max assist       General transfer comment: no incr RLE tone or extension noted today as previously; RLE buckling and beginning to push with LLE to the Rt    Ambulation/Gait                 Stairs            Wheelchair Mobility    Modified Rankin (Stroke Patients Only) Modified Rankin (Stroke Patients Only) Pre-Morbid Rankin Score: No symptoms Modified Rankin: Severe disability     Balance   Sitting-balance support: Feet supported;Single extremity supported Sitting balance-Leahy Scale: Zero Sitting balance - Comments: at times pushing with LUE causing fall towards Rt; if  LUE unsupported, she leans to Rt due to trunk weakness   Standing balance support: Single extremity supported Standing balance-Leahy Scale: Zero Standing balance comment: head flexed, looking down; could briefly upright herself with cues and then returns to "hanging her head down." poor extension/support of RLE                    Cognition Arousal/Alertness: Lethargic (although more alert than last session) Behavior During Therapy: Flat affect Overall Cognitive Status: Difficult to assess Area of Impairment: Attention;Following commands   Current Attention Level: Sustained   Following Commands: Follows one step commands inconsistently;Follows one step commands with increased time            Exercises      General Comments General comments (skin integrity, edema, etc.): mulitple friends and family members arrived at same time as PT; allowed 3 into room during session, however had to remind them twice to not talk as they were distracting Vaughan Basta       Pertinent Vitals/Pain Pain Assessment: Faces Faces Pain Scale: No hurt    Home Living                      Prior Function            PT Goals (current goals can now be found in the care plan section) Acute Rehab PT Goals Patient Stated Goal: unable to state due to lethargy; decr speech PT Goal Formulation: Patient unable to participate in goal setting Time For Goal Achievement:  05/13/15 Potential to Achieve Goals: Fair Progress towards PT goals: Not progressing toward goals - comment;Goals downgraded-see care plan (worsening symptoms since eval)    Frequency  Min 4X/week    PT Plan Current plan remains appropriate (if family can provide necessary support)    Co-evaluation             End of Session Equipment Utilized During Treatment: Gait belt Activity Tolerance: Patient limited by lethargy Patient left: with call bell/phone within reach;in chair;with chair alarm set;with family/visitor  present     Time: 1420-1450 PT Time Calculation (min) (ACUTE ONLY): 30 min  Charges:  $Therapeutic Activity: 23-37 mins                    G Codes:      Chanteria Haggard 06-01-2015, 3:05 PM Pager [phone]

## 2015-05-02 NOTE — Progress Notes (Signed)
STROKE TEAM PROGRESS NOTE   SUBJECTIVE (INTERVAL HISTORY) Daughter is at beside. Pt more awake alert and follows simple commands but still not talk, possible severe dysarthria with abulia.      OBJECTIVE Temp:  [97.9 F (36.6 C)-98.8 F (37.1 C)] 97.9 F (36.6 C) (10/28 1650) Pulse Rate:  [50-65] 52 (10/28 1650) Cardiac Rhythm:  [-] Sinus bradycardia (10/28 0817) Resp:  [16-20] 16 (10/28 1650) BP: (129-153)/(65-73) 129/70 mmHg (10/28 1650) SpO2:  [96 %-100 %] 100 % (10/28 1650)  CBC:   Recent Labs Lab 04/28/15 1414  04/28/15 2000 05/02/15 0236  WBC 9.4  --  10.2 9.1  NEUTROABS 6.1  --   --   --   HGB 13.4  < > 14.5 14.1  HCT 41.0  < > 44.4 43.2  MCV 85.2  --  85.2 85.9  PLT 210  --  245 225  < > = values in this interval not displayed.  Basic Metabolic Panel:   Recent Labs Lab 04/28/15 2000 05/02/15 0236  NA 141 140  K 3.7 3.7  CL 105 105  CO2 28 23  GLUCOSE 221* 222*  BUN 8 10  CREATININE 0.85 0.96  CALCIUM 9.9 9.9    Lipid Panel:     Component Value Date/Time   CHOL 212* 04/29/2015 0439   TRIG 236* 04/29/2015 0439   HDL 44 04/29/2015 0439   CHOLHDL 4.8 04/29/2015 0439   VLDL 47* 04/29/2015 0439   LDLCALC 121* 04/29/2015 0439   HgbA1c:  Lab Results  Component Value Date   HGBA1C 11.6* 04/29/2015   Urine Drug Screen:     Component Value Date/Time   LABOPIA NONE DETECTED 04/29/2015 1209   COCAINSCRNUR NONE DETECTED 04/29/2015 1209   LABBENZ NONE DETECTED 04/29/2015 1209   AMPHETMU NONE DETECTED 04/29/2015 1209   THCU NONE DETECTED 04/29/2015 1209   LABBARB NONE DETECTED 04/29/2015 1209      IMAGING I have personally reviewed the radiological images below and agree with the radiology interpretations.  Dg Chest 2 View 04/28/2015  No active cardiopulmonary disease.   Ct Head Wo Contrast 04/30/2015 1. Acute left ACA territory infarcts without evidence of progression. No interval finding to explain new deficit. 2. Tiny hyperintensity in the  posterior left cerebral cortex, favor volume-averaging over petechial hemorrhage. 04/28/2015  No acute abnormality.   Mr Brain Ltd W/o Cm  05/01/2015  IMPRESSION: 1. Stable size and extent of left ACA territory nonhemorrhagic infarct with expected evolution of T2 change. 2. Stable atrophy and T2 white matter changes otherwise. 3. No significant interval change otherwise.  MRI HEAD 04/29/2015   Acute large LEFT anterior cerebral artery territory infarct without hemorrhagic conversion. Moderate chronic small vessel ischemic disease.   MRA HEAD 04/29/2015   Acute LEFT A2 occlusion compatible with thromboembolic disease. Moderate to high-grade stenosis RIGHT A2 segment.   CUS - Bilateral: 1-39% ICA stenosis. Vertebral artery flow is antegrade.  TTE - Left ventricle: The cavity size was normal. Systolic function wasnormal. The estimated ejection fraction was in the range of 50%to 55%. Doppler parameters are consistent with abnormal leftventricular relaxation (grade 1 diastolic dysfunction). Atrial septum: No defect or patent foramen ovale was identified. Pericardium, extracardiac: A trivial pericardial effusion wasidentified. Features were not consistent with tamponadephysiology. Impressions: No cardiac source of embolism was identified, but cannot be ruledout on the basis of this examination.   PHYSICAL EXAM General - obesity, well developed, lethargic, abulia, expressive aphasia.  Ophthalmologic - Fundi not visualized due to noncooperation.  Cardiovascular - Regular rate and rhythm.  Mental Status -  Awake, alert, not answer orientation questions. Language showed expressive aphasia, able to follow simple commands, not able to name or repeat. Maybe severe dysarthria too.   Cranial Nerves II - XII - II - Visual field intact OU. III, IV, VI - Extraocular movements intact. V - Facial sensation intact bilaterally. VII - right facial droop. VIII - Hearing & vestibular intact  bilaterally. X - not cooperative on exam. XI - not cooperative on exam. XII - Tongue protrusion intact.  Motor Strength - The patient's strength exam not cooperative, but RUE 0/5, RLE 1/5. spontaneous movement at LUE and LLE.  Bulk was normal and fasciculations were absent.   Motor Tone - Muscle tone was assessed at the neck and appendages and was normal.  Reflexes - The patient's reflexes were 1+ in all extremities and she had no pathological reflexes.  Sensory - Light touch, temperature/pinprick were assessed and not cooperative, but respond to pain stimulation.    Coordination - not cooperative on exam.  Tremor was absent.  Gait and Station - not tested.    ASSESSMENT/PLAN Ms. ROSEALEE RECINOS is a 69 y.o. female with history of HTN, DM and CHF presenting with right arm and leg weakness associated with slurred speech and confusion. She did not receive IV t-PA due to delay in arrival.   Stroke:  Dominant  L ACA infarct in setting of A2 occlusion, possible large vessel athero. Embolic stroke not able to completely ruled out. Cardiology plan for TEE  Resultant  Right hemiplegia, aphasia, dysarthria, abulia   MRI  L ACA infarct  Repeat MRI showed stable L ACA infarct, no new area infarct.  MRA  L A2 occlusion, high grade stenosis R A2  Carotid Doppler  No significant stenosis   2D Echo  Unremarkable  Cardiology plan for TEE once more stable  LDL 121  HgbA1c 11.6  Heparin 5000 units sq tid for VTE prophylaxis DIET - DYS 1 Room service appropriate?: Yes; Fluid consistency:: Thin  aspirin 81 mg daily prior to admission, now on dual antiplatelet.  Patient counseled to be compliant with her antithrombotic medications  Recommend OP tele monitoring to look for atrial fibrillation as source of stroke  Ongoing aggressive stroke risk factor management  Therapy recommendations:  CIR  Disposition:  pending   Worsening left UE weakness with lack of speech output  Repeat MRI  did not show new area infarct  Still left ACA infarct  Likely abulia, severe dysarthria, akinetic mutism due to frontal infarct   Continue PT/OT  Encourage more stimulation and communication  Hypertension  Home meds: norvasc 5 and metoprolol 50mg   Stable  On norvasc 5mg  and metoprolol 50mg   BP goal gradually normalize   Hyperlipidemia  Home meds:  lipitor 10, resumed in hospital  LDL 121, goal < 70  Increased lipitor to 40mg   Continue statin at discharge  Diabetes type II, Uncontrolled  HgbA1c 11.6, goal < 7.0  On lantus. Increased per diabetic RN recommendation  SSI  Tobacco abuse  Current smoker  Smoking cessation counseling provided  Son stated that he will work with pt to to quit  Other Stroke Risk Factors  Advanced age  CAD, mild  Other Active Problems  CHF secondary to preserved LV systolic function  GERD on protonix  Hospital day # 4  Neurology will sign off. Please call with questions. Pt will follow up with Dr. Erlinda Hong at [facility] in about 2 months.  Thanks for the consult.   Melissa Hawking, MD PhD Stroke Neurology 05/02/2015 6:03 PM    To contact Stroke Continuity provider, please refer to [email or web address] After hours, contact General Neurology

## 2015-05-03 LAB — GLUCOSE, CAPILLARY
Glucose-Capillary: 192 mg/dL — ABNORMAL HIGH (ref 65–99)
Glucose-Capillary: 262 mg/dL — ABNORMAL HIGH (ref 65–99)
Glucose-Capillary: 187 mg/dL — ABNORMAL HIGH (ref 65–99)
Glucose-Capillary: 143 mg/dL — ABNORMAL HIGH (ref 65–99)

## 2015-05-03 NOTE — Progress Notes (Signed)
Ref: Charolette Forward, MD   Subjective:  Feeling better. Not able to talk. Able to read and follow simple command. Significant right sided weakness.  Objective:  Vital Signs in the last 24 hours: Temp:  [97.1 F (36.2 C)-99.3 F (37.4 C)] 98.7 F (37.1 C) (10/29 2220) Pulse Rate:  [55-69] 57 (10/29 2220) Cardiac Rhythm:  [-] Normal sinus rhythm (10/29 1900) Resp:  [20] 20 (10/29 2220) BP: (116-140)/(68-91) 119/77 mmHg (10/29 2220) SpO2:  [96 %-100 %] 99 % (10/29 2220)  Physical Exam: BP Readings from Last 1 Encounters:  05/03/15 119/77    Wt Readings from Last 1 Encounters:  04/28/15 72.9 kg (160 lb 11.5 oz)    Weight change:   HEENT: [place]/AT, Eyes-Brown, PERL, EOMI, Conjunctiva-Pink, Sclera-Non-icteric. Right facial droop. Neck: No JVD, No bruit, Trachea midline. Lungs:  Clear, Bilateral. Cardiac:  Regular rhythm, normal S1 and S2, no S3.  Abdomen:  Soft, non-tender. Extremities:  No edema present. No cyanosis. No clubbing. CNS: AxOx3, Cranial nerves grossly intact, 0/5 right sided strength and preserved left sided movements/strength.. Skin: Warm and dry.   Intake/Output from previous day: 10/28 0701 - 10/29 0700 In: 240 [P.O.:240] Out: -     Lab Results: BMET    Component Value Date/Time   NA 140 05/02/2015 0236   NA 141 04/28/2015 2000   NA 137 04/28/2015 1420   K 3.7 05/02/2015 0236   K 3.7 04/28/2015 2000   K 3.9 04/28/2015 1420   CL 105 05/02/2015 0236   CL 105 04/28/2015 2000   CL 106 04/28/2015 1420   CO2 23 05/02/2015 0236   CO2 28 04/28/2015 2000   CO2 21* 04/28/2015 1414   GLUCOSE 222* 05/02/2015 0236   GLUCOSE 221* 04/28/2015 2000   GLUCOSE 381* 04/28/2015 1420   BUN 10 05/02/2015 0236   BUN 8 04/28/2015 2000   BUN 12 04/28/2015 1420   CREATININE 0.96 05/02/2015 0236   CREATININE 0.85 04/28/2015 2000   CREATININE 0.90 04/28/2015 1420   CALCIUM 9.9 05/02/2015 0236   CALCIUM 9.9 04/28/2015 2000   CALCIUM 9.3 04/28/2015 1414   GFRNONAA 59*  05/02/2015 0236   GFRNONAA >60 04/28/2015 2000   GFRNONAA 58* 04/28/2015 1414   GFRAA >60 05/02/2015 0236   GFRAA >60 04/28/2015 2000   GFRAA >60 04/28/2015 1414   CBC    Component Value Date/Time   WBC 9.1 05/02/2015 0236   RBC 5.03 05/02/2015 0236   HGB 14.1 05/02/2015 0236   HCT 43.2 05/02/2015 0236   PLT 225 05/02/2015 0236   MCV 85.9 05/02/2015 0236   MCH 28.0 05/02/2015 0236   MCHC 32.6 05/02/2015 0236   RDW 13.2 05/02/2015 0236   LYMPHSABS 2.5 04/28/2015 1414   MONOABS 0.7 04/28/2015 1414   EOSABS 0.1 04/28/2015 1414   BASOSABS 0.0 04/28/2015 1414   HEPATIC Function Panel  Recent Labs  02/07/15 0845 04/28/15 1414 04/28/15 2000  PROT 6.6 6.3* 6.8   HEMOGLOBIN A1C No components found for: HGA1C,  MPG CARDIAC ENZYMES Lab Results  Component Value Date   CKTOTAL 231* 12/23/2010   CKMB 2.8 12/23/2010   TROPONINI <0.30 11/13/2012   TROPONINI <0.30 11/12/2012   TROPONINI <0.30 11/12/2012   BNP No results for input(s): PROBNP in the last 8760 hours. TSH No results for input(s): TSH in the last 8760 hours. CHOLESTEROL  Recent Labs  02/07/15 0845 04/29/15 0439  CHOL 175 212*    Scheduled Meds: . amLODipine  5 mg Oral Daily  . aspirin  EC  81 mg Oral Daily  . atorvastatin  40 mg Oral Daily  . clopidogrel  75 mg Oral Daily  . heparin  5,000 Units Subcutaneous 3 times per day  . insulin aspart  0-9 Units Subcutaneous TID WC  . insulin glargine  20 Units Subcutaneous QHS  . metoprolol succinate  50 mg Oral Daily  . neomycin-bacitracin-polymyxin   Topical BID  . pantoprazole  40 mg Oral Q0600   Continuous Infusions: . 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  Mild coronary artery disease in the past.  Uncontrolled hypertension.  Uncontrolled  diabetes mellitus.  Compensated congestive heart failure secondary to preserved LV systolic function.  History of tobacco abuse.  Hyperlipidemia.  Continue medical treatment.   LOS: 5 days    Dixie Dials  MD  05/03/2015, 11:47 PM

## 2015-05-04 LAB — GLUCOSE, CAPILLARY
GLUCOSE-CAPILLARY: 226 mg/dL — AB (ref 65–99)
GLUCOSE-CAPILLARY: 115 mg/dL — AB (ref 65–99)
GLUCOSE-CAPILLARY: 199 mg/dL — AB (ref 65–99)
Glucose-Capillary: 161 mg/dL — ABNORMAL HIGH (ref 65–99)

## 2015-05-04 NOTE — Progress Notes (Signed)
Ref: Charolette Forward, MD   Subjective:  Awake. Nodes head to undergoTEE procedure.  Objective:  Vital Signs in the last 24 hours: Temp:  [97.6 F (36.4 C)-99 F (37.2 C)] 98.4 F (36.9 C) (10/30 1756) Pulse Rate:  [40-69] 69 (10/30 1756) Cardiac Rhythm:  [-] Normal sinus rhythm (10/30 2000) Resp:  [18-20] 18 (10/30 1756) BP: (125-148)/(64-81) 132/78 mmHg (10/30 1756) SpO2:  [96 %-100 %] 97 % (10/30 1756)  Physical Exam: BP Readings from Last 1 Encounters:  05/04/15 132/78    Wt Readings from Last 1 Encounters:  04/28/15 72.9 kg (160 lb 11.5 oz)    Weight change:   HEENT: [place]/AT, Eyes-Brown, PERL, EOMI, Conjunctiva-Pink, Sclera-Non-icteric Neck: No JVD, No bruit, Trachea midline. Lungs:  Clear, Bilateral. Cardiac:  Regular rhythm, normal S1 and S2, no S3.  Abdomen:  Soft, non-tender. Extremities:  No edema present. No cyanosis. No clubbing. CNS: AxOx3, Cranial nerves grossly intact, 0/5 right sided strength and preserved left sided strength. Skin: Warm and dry.   Intake/Output from previous day: 10/29 0701 - 10/30 0700 In: 600 [P.O.:600] Out: -     Lab Results: BMET    Component Value Date/Time   NA 140 05/02/2015 0236   NA 141 04/28/2015 2000   NA 137 04/28/2015 1420   K 3.7 05/02/2015 0236   K 3.7 04/28/2015 2000   K 3.9 04/28/2015 1420   CL 105 05/02/2015 0236   CL 105 04/28/2015 2000   CL 106 04/28/2015 1420   CO2 23 05/02/2015 0236   CO2 28 04/28/2015 2000   CO2 21* 04/28/2015 1414   GLUCOSE 222* 05/02/2015 0236   GLUCOSE 221* 04/28/2015 2000   GLUCOSE 381* 04/28/2015 1420   BUN 10 05/02/2015 0236   BUN 8 04/28/2015 2000   BUN 12 04/28/2015 1420   CREATININE 0.96 05/02/2015 0236   CREATININE 0.85 04/28/2015 2000   CREATININE 0.90 04/28/2015 1420   CALCIUM 9.9 05/02/2015 0236   CALCIUM 9.9 04/28/2015 2000   CALCIUM 9.3 04/28/2015 1414   GFRNONAA 59* 05/02/2015 0236   GFRNONAA >60 04/28/2015 2000   GFRNONAA 58* 04/28/2015 1414   GFRAA >60  05/02/2015 0236   GFRAA >60 04/28/2015 2000   GFRAA >60 04/28/2015 1414   CBC    Component Value Date/Time   WBC 9.1 05/02/2015 0236   RBC 5.03 05/02/2015 0236   HGB 14.1 05/02/2015 0236   HCT 43.2 05/02/2015 0236   PLT 225 05/02/2015 0236   MCV 85.9 05/02/2015 0236   MCH 28.0 05/02/2015 0236   MCHC 32.6 05/02/2015 0236   RDW 13.2 05/02/2015 0236   LYMPHSABS 2.5 04/28/2015 1414   MONOABS 0.7 04/28/2015 1414   EOSABS 0.1 04/28/2015 1414   BASOSABS 0.0 04/28/2015 1414   HEPATIC Function Panel  Recent Labs  02/07/15 0845 04/28/15 1414 04/28/15 2000  PROT 6.6 6.3* 6.8   HEMOGLOBIN A1C No components found for: HGA1C,  MPG CARDIAC ENZYMES Lab Results  Component Value Date   CKTOTAL 231* 12/23/2010   CKMB 2.8 12/23/2010   TROPONINI <0.30 11/13/2012   TROPONINI <0.30 11/12/2012   TROPONINI <0.30 11/12/2012   BNP No results for input(s): PROBNP in the last 8760 hours. TSH No results for input(s): TSH in the last 8760 hours. CHOLESTEROL  Recent Labs  02/07/15 0845 04/29/15 0439  CHOL 175 212*    Scheduled Meds: . amLODipine  5 mg Oral Daily  . aspirin EC  81 mg Oral Daily  . atorvastatin  40 mg Oral Daily  .  clopidogrel  75 mg Oral Daily  . heparin  5,000 Units Subcutaneous 3 times per day  . insulin aspart  0-9 Units Subcutaneous TID WC  . insulin glargine  20 Units Subcutaneous QHS  . metoprolol succinate  50 mg Oral Daily  . neomycin-bacitracin-polymyxin   Topical BID  . pantoprazole  40 mg Oral Q0600   Continuous Infusions: . sodium chloride 50 mL/hr at 04/29/15 1957   PRN Meds:.acetaminophen, nitroGLYCERIN, senna-docusate  Assessment/Plan: Dominant left anterior cerebral artery infarct with Right sided hemiparesis Dysarthria Moderate to high-grade stenosis right A2 segment.  Mild coronary artery disease in the past.  Uncontrolled hypertension.  Uncontrolled diabetes mellitus.  Compensated congestive heart failure secondary to preserved LV  systolic function.  History of tobacco abuse.  Hyperlipidemia.  Continue medical treatment.    LOS: 6 days    Dixie Dials  MD  05/04/2015, 10:40 PM

## 2015-05-04 NOTE — Clinical Social Work Note (Signed)
Clinical Social Work Assessment  Patient Details  Name: [name] MRN: [id number] Date of Birth: 1945/11/17  Date of referral:                  Reason for consult:  Facility Placement                Permission sought to share information with:  Facility Sport and exercise psychologist, Family Supports Permission granted to share information::  Yes, Verbal Permission Granted  Name::        Agency::     Relationship::     Contact Information:     Housing/Transportation Living arrangements for the past 2 months:  Single Family Home Source of Information:  Adult Children Patient Interpreter Needed:  None Criminal Activity/Legal Involvement Pertinent to Current Situation/Hospitalization:  No - Comment as needed Significant Relationships:  Adult Children, Spouse Lives with:  Spouse Do you feel safe going back to the place where you live?  Yes Need for family participation in patient care:  Yes (Comment)  Care giving concerns: CSW presented in room of several guests. Including patient's sister, partner of 30 years (was not clear if this was spouse), and 2 others. Patient's sister advised CSW to discuss with son's Kerin Ransom. CSW spoke with [name] who is in agreement with patient being faxed out to [facility].    Social Worker assessment / plan: Provided family with list of facilities. Will fax to [employer]   Employment status:  Retired Health visitor, Managed Care PT Recommendations:  Inpatient [place], [place] / Referral to community resources:  [place]  Patient/Family's Response to care: Patient's son and extended family agreeable to patient going to SNF for [place] rehab.  Patient/Family's Understanding of and Emotional Response to Diagnosis, Current Treatment, and Prognosis: Patient and family hopeful for rehab to be helpful for patient recovery.   Emotional Assessment Appearance:  Appears stated  age Attitude/Demeanor/Rapport:   (Quiet; withdrawn) Affect (typically observed):  Accepting Orientation:  Oriented to Self, Oriented to Place, Oriented to  Time, Oriented to Situation Alcohol / Substance use:  Not Applicable Psych involvement (Current and /or in the community):  No (Comment)  Discharge Needs  Concerns to be addressed:  No discharge needs identified Readmission within the last 30 days:  No Current discharge risk:  None Barriers to Discharge:  No Barriers Identified   [place] Denner, Daneil Dolin, LCSW 05/04/2015, 2:32 PM

## 2015-05-04 NOTE — Progress Notes (Signed)
Operator called back and said he reached the MD's answering service. Awaiting reply.

## 2015-05-04 NOTE — Progress Notes (Signed)
Called in by family member. On entering, patient was noted crying and touching between breast. Family thought patient was having chest pain. Patient non verbal but nodded when asked if she was hurting. Patient on heart monitor which was showing normal sinus rhythm. MD paged through the operator. Still awaiting call back.

## 2015-05-04 NOTE — NC FL2 (Signed)
[place] LEVEL OF CARE SCREENING TOOL     IDENTIFICATION  Patient Name: [name] Birthdate: 1946/03/17 Sex: female Admission Date (Current Location): 04/28/2015  [facility] and [place] Number: Herbalist and Address:  [facility] [place], [place] [address] [place], [place] [address]      Provider Number: [id number]  Attending Physician Name and Address:  Melissa Forward, MD  Relative Name and Phone Number:  [name] - [phone]    Current Level of Care: Hospital Recommended Level of Care: [place] Prior Approval Number:    Date Approved/Denied:   PASRR Number: [id number]  Discharge Plan: SNF    Current Diagnoses: Patient Active Problem List   Diagnosis Date Noted  . Left-sided cerebrovascular accident (CVA) ([place]) 04/28/2015    Orientation ACTIVITIES/SOCIAL BLADDER RESPIRATION    Self, Time, Place, Situation  Passive Continent Normal  BEHAVIORAL SYMPTOMS/MOOD NEUROLOGICAL BOWEL NUTRITION STATUS      Continent Diet  PHYSICIAN VISITS COMMUNICATION OF NEEDS Height & Weight Skin  30 days Verbally (Very soft spoken)   160 lbs. Normal          AMBULATORY STATUS RESPIRATION    Assist extensive Normal      Personal Care Assistance Level of Assistance  Bathing, Feeding, Dressing Bathing Assistance: Limited assistance Feeding assistance: Limited assistance Dressing Assistance: Limited assistance      Functional Limitations Info                [name]  PT (By licensed PT)     PT Frequency: 5             Additional Factors Info  Insulin Sliding Scale       Insulin Sliding Scale Info: 0-9 units TID       Current Medications (05/04/2015): Current Facility-Administered Medications  Medication Dose Route Frequency Provider Last Rate Last Dose  . 0.9 %  sodium chloride infusion   Intravenous Continuous Melissa Forward, MD 50 mL/hr at 04/29/15 1957    . acetaminophen  (TYLENOL) tablet 650 mg  650 mg Oral Q8H PRN Melissa Forward, MD   650 mg at 05/03/15 1613  . amLODipine (NORVASC) tablet 5 mg  5 mg Oral Daily Melissa Forward, MD   5 mg at 05/04/15 1038  . aspirin EC tablet 81 mg  81 mg Oral Daily Melissa Hawking, MD   81 mg at 05/04/15 1038  . atorvastatin (LIPITOR) tablet 40 mg  40 mg Oral Daily Melissa Forward, MD   40 mg at 05/04/15 1038  . clopidogrel (PLAVIX) tablet 75 mg  75 mg Oral Daily Melissa Starch, NP   75 mg at 05/04/15 1039  . heparin injection 5,000 Units  5,000 Units Subcutaneous 3 times per day Melissa Forward, MD   5,000 Units at 05/04/15 1414  . insulin aspart (novoLOG) injection 0-9 Units  0-9 Units Subcutaneous TID WC Melissa Forward, MD   3 Units at 05/04/15 1136  . insulin glargine (LANTUS) injection 20 Units  20 Units Subcutaneous QHS Melissa Starch, NP   20 Units at 05/03/15 2211  . metoprolol succinate (TOPROL-XL) 24 hr tablet 50 mg  50 mg Oral Daily Melissa Forward, MD   50 mg at 05/04/15 1038  . neomycin-bacitracin-polymyxin (NEOSPORIN) ointment   Topical BID Melissa Forward, MD      . nitroGLYCERIN (NITROSTAT) SL tablet 0.4 mg  0.4 mg Sublingual Q5 min PRN Melissa Forward, MD      .  pantoprazole (PROTONIX) EC tablet 40 mg  40 mg Oral Q0600 Melissa Forward, MD   40 mg at 05/04/15 0646  . senna-docusate (Senokot-S) tablet 1 tablet  1 tablet Oral QHS PRN Melissa Forward, MD   1 tablet at 05/02/15 1106   Do not use this list as official medication orders. Please verify with discharge summary.  Discharge Medications:   Medication List    ASK your doctor about these medications        amLODipine 5 MG tablet  Commonly known as:  NORVASC  Take 5 mg by mouth daily.     aspirin 81 MG EC tablet  Take 1 tablet (81 mg total) by mouth daily.     atorvastatin 10 MG tablet  Commonly known as:  LIPITOR  Take 1 tablet (10 mg total) by mouth daily.     nitroGLYCERIN 0.4 MG SL tablet  Commonly known as:  NITROSTAT  Place 0.4 mg under the tongue every 5 (five)  minutes as needed for chest pain.     pantoprazole 40 MG tablet  Commonly known as:  PROTONIX  Take 1 tablet (40 mg total) by mouth daily at 6 (six) AM.        Relevant Imaging Results:  Relevant Lab Results:  Recent Labs    Additional Information    LINDSEY, Daneil Dolin, LCSW

## 2015-05-05 ENCOUNTER — Inpatient Hospital Stay (HOSPITAL_COMMUNITY): Payer: Medicare Other

## 2015-05-05 ENCOUNTER — Encounter (HOSPITAL_COMMUNITY): Payer: Self-pay | Admitting: *Deleted

## 2015-05-05 ENCOUNTER — Encounter (HOSPITAL_COMMUNITY)
Admission: EM | Disposition: A | Discharge status: Skilled Nursing Facility | Payer: Self-pay | Source: Home / Self Care | Attending: Cardiology

## 2015-05-05 HISTORY — PX: TEE WITHOUT CARDIOVERSION: SHX5443

## 2015-05-05 LAB — GLUCOSE, CAPILLARY
GLUCOSE-CAPILLARY: 198 mg/dL — AB (ref 65–99)
GLUCOSE-CAPILLARY: 159 mg/dL — AB (ref 65–99)
Glucose-Capillary: 173 mg/dL — ABNORMAL HIGH (ref 65–99)
Glucose-Capillary: 227 mg/dL — ABNORMAL HIGH (ref 65–99)

## 2015-05-05 SURGERY — ECHOCARDIOGRAM, TRANSESOPHAGEAL
Anesthesia: Moderate Sedation

## 2015-05-05 MED ORDER — MIDAZOLAM HCL 5 MG/ML IJ SOLN
INTRAMUSCULAR | Status: AC
  Filled 2015-05-05: qty 2

## 2015-05-05 MED ORDER — MIDAZOLAM HCL 10 MG/2ML IJ SOLN
INTRAMUSCULAR | Status: DC | PRN
  Administered 2015-05-05 (×2): 1 mg via INTRAVENOUS

## 2015-05-05 MED ORDER — SODIUM CHLORIDE 0.9 % IV SOLN
INTRAVENOUS | Status: DC
  Administered 2015-05-05: 500 mL via INTRAVENOUS

## 2015-05-05 MED ORDER — WARFARIN - PHARMACIST DOSING INPATIENT: Freq: Every day | Status: DC

## 2015-05-05 MED ORDER — FENTANYL CITRATE (PF) 100 MCG/2ML IJ SOLN
INTRAMUSCULAR | Status: DC | PRN
  Administered 2015-05-05 (×2): 25 ug via INTRAVENOUS

## 2015-05-05 MED ORDER — FENTANYL CITRATE (PF) 100 MCG/2ML IJ SOLN
INTRAMUSCULAR | Status: AC
  Filled 2015-05-05: qty 2

## 2015-05-05 MED ORDER — WARFARIN SODIUM 3 MG PO TABS
3.0000 mg | ORAL_TABLET | Freq: Every day | ORAL | Status: DC
  Administered 2015-05-05 – 2015-05-06 (×2): 3 mg via ORAL
  Filled 2015-05-05 (×2): qty 1

## 2015-05-05 MED ORDER — BUTAMBEN-TETRACAINE-BENZOCAINE 2-2-14 % EX AERO
INHALATION_SPRAY | CUTANEOUS | Status: DC | PRN
  Administered 2015-05-05: 2 via TOPICAL

## 2015-05-05 NOTE — Progress Notes (Signed)
  Echocardiogram Echocardiogram Transesophageal has been performed.  [name] 05/05/2015, 10:23 AM

## 2015-05-05 NOTE — Progress Notes (Signed)
Subjective:  Awake tolerated TEE early this morning results noted. Patient noted to have PFO  Objective:  Vital Signs in the last 24 hours: Temp:  [97.6 F (36.4 C)-98.9 F (37.2 C)] 98.2 F (36.8 C) (10/31 1045) Pulse Rate:  [49-73] 53 (10/31 1138) Resp:  [9-24] 15 (10/31 1138) BP: (104-171)/(64-92) 118/72 mmHg (10/31 1138) SpO2:  [93 %-100 %] 94 % (10/31 1138)  Intake/Output from previous day:   Intake/Output from this shift:    Physical Exam: Neck: no adenopathy, no carotid bruit, no JVD and supple, symmetrical, trachea midline Lungs: Clear to auscultation anteriorly Heart: regular rate and rhythm, S1, S2 normal and Soft systolic murmur noted Abdomen: soft, non-tender; bowel sounds normal; no masses,  no organomegaly Extremities: extremities normal, atraumatic, no cyanosis or edema and No evidence of DVT  Lab Results: No results for input(s): WBC, HGB, PLT in the last 72 hours. No results for input(s): NA, K, CL, CO2, GLUCOSE, BUN, CREATININE in the last 72 hours. No results for input(s): TROPONINI in the last 72 hours.  Invalid input(s): CK, MB Hepatic Function Panel No results for input(s): PROT, ALBUMIN, AST, ALT, ALKPHOS, BILITOT, BILIDIR, IBILI in the last 72 hours. No results for input(s): CHOL in the last 72 hours. No results for input(s): PROTIME in the last 72 hours.  Imaging: Imaging results have been reviewed and No results found.  Cardiac Studies:  Assessment/Plan:  Evolving left ACA infarct in the setting of a dual occlusion possible large vessel atheroma cannot rule out embolic event. Status post TEE noted to have PFO Moderate to high-grade stenosis right A2 segment.  Mild coronary artery disease in the past.  Uncontrolled hypertension.  Uncontrolled diabetes mellitus.  Compensated congestive heart failure secondary to preserved LV systolic function.  History of tobacco abuse.  Hyperlipidemia. Plan We'll get duplex ultrasound of lower  extremities to rule out DVT Restart Coumadin low dose and DC aspirin Will continue Plavix for now Awaiting skilled nursing facility  LOS: 7 days    Charolette Forward 05/05/2015, 11:46 AM

## 2015-05-05 NOTE — Interval H&P Note (Signed)
History and Physical Interval Note:  05/05/2015 9:03 AM  [name]  has presented today for surgery, with the diagnosis of stroke  The various methods of treatment have been discussed with the patient and family. After consideration of risks, benefits and other options for treatment, the patient has consented to  Procedure(s): TRANSESOPHAGEAL ECHOCARDIOGRAM (TEE) (N/A) as a surgical intervention .  The patient's history has been reviewed, patient examined, no change in status, stable for surgery.  I have reviewed the patient's chart and labs.  Questions were answered to the patient's satisfaction.     Tej Murdaugh S

## 2015-05-05 NOTE — Progress Notes (Signed)
Physical Therapy Treatment Patient Details Name: KEVONA LUPINACCI MRN: [id number] DOB: 22-Apr-1946 Today's Date: 05/05/2015    History of Present Illness Adm 10/24 with rt sided weakness; MRI + large lt ACA CVA; multiple episodes of decline in status with repeat head CT or MRI's with no further acute changes found  PMHx-CHF, CAD, HTN, tobacco use, DM, HTN    PT Comments    Pt progressing towards physical therapy goals. Was able to participate more in therapy session today, and tolerated seated trunk stability activity at EOB. Pt was able to stand with +2 assist and demonstrate improved posture with cueing. At this time, I do not feel this pt is appropriate for CIR, and recommend d/c to SNF for continued physical therapy prior to return home with family. Will continue to follow and progress as able per POC.   Follow Up Recommendations  SNF;Supervision/Assistance - 24 hour     Equipment Recommendations  Other (comment) (TBD by next venue of care)    Recommendations for Other Services       Precautions / Restrictions Precautions Precautions: Fall Restrictions Weight Bearing Restrictions: No    Mobility  Bed Mobility Overal bed mobility: Needs Assistance Bed Mobility: Rolling;Sidelying to Sit;Sit to Supine Rolling: Max assist Sidelying to sit: Max assist;+2 for physical assistance;HOB elevated   Sit to supine: Max assist;+2 for physical assistance   General bed mobility comments: Pt required +2 assist for all aspects of bed mobility. Hand-over-hand assist to reach for bed rail with LUE.   Transfers Overall transfer level: Needs assistance Equipment used: 2 person hand held assist Transfers: Sit to/from Stand Sit to Stand: Max assist;+2 physical assistance         General transfer comment: R knee block utilized, and pt was able to tolerate static standing EOB for ~20 seconds. Pt was able to hold to therapist's arm for support with LUE.   Ambulation/Gait              General Gait Details: Unable at this time.   Stairs            Wheelchair Mobility    Modified Rankin (Stroke Patients Only) Modified Rankin (Stroke Patients Only) Pre-Morbid Rankin Score: No symptoms Modified Rankin: Severe disability     Balance Overall balance assessment: Needs assistance Sitting-balance support: Feet supported;Single extremity supported Sitting balance-Leahy Scale: Poor Sitting balance - Comments: Initially pushing to the R side, but was able to briefly hold herself upright without therapist support after leaning onto the L side x2 bouts. Therapist behind pt working on improved posture with pec stretch and back extension. Pt able to scoot her L hip forward on EOB without assist.  Postural control: Posterior lean;Right lateral lean Standing balance support: Bilateral upper extremity supported;During functional activity Standing balance-Leahy Scale: Zero Standing balance comment: Pt was able to improve static standing posture with cues.                     Cognition Arousal/Alertness: Lethargic Behavior During Therapy: Flat affect Overall Cognitive Status: Difficult to assess Area of Impairment: Attention;Following commands   Current Attention Level: Sustained   Following Commands: Follows one step commands inconsistently;Follows one step commands with increased time            Exercises      General Comments        Pertinent Vitals/Pain Pain Assessment: Faces Faces Pain Scale: Hurts a little bit Pain Location: Pt grimacing slightly with mobility Pain Descriptors /  Indicators: Grimacing Pain Intervention(s): Monitored during session;Repositioned    Home Living                      Prior Function            PT Goals (current goals can now be found in the care plan section) Acute Rehab PT Goals Patient Stated Goal: unable to state due to lethargy; decr speech PT Goal Formulation: Patient unable to participate in  goal setting Time For Goal Achievement: 05/13/15 Potential to Achieve Goals: Fair Progress towards PT goals: Progressing toward goals    Frequency  Min 3X/week    PT Plan Discharge plan needs to be updated;Frequency needs to be updated    Co-evaluation PT/OT/SLP Co-Evaluation/Treatment: Yes Reason for Co-Treatment: Complexity of the patient's impairments (multi-system involvement);For patient/therapist safety PT goals addressed during session: Mobility/safety with mobility;Balance       End of Session Equipment Utilized During Treatment: Gait belt Activity Tolerance: Patient limited by lethargy Patient left: with call bell/phone within reach;in chair;with chair alarm set;with family/visitor present     Time: 1425-1451 PT Time Calculation (min) (ACUTE ONLY): 26 min  Charges:  $Therapeutic Activity: 8-22 mins                    G Codes:      Rolinda Roan 22-May-2015, 3:13 PM  Rolinda Roan, PT, DPT Acute Rehabilitation Services Pager: [phone]

## 2015-05-05 NOTE — Progress Notes (Signed)
Reviewed therapy treatment notes from 10/28. Pt has not yet demonstrated the ability to participate in intense therapies. I continue to recommend SNF at this time. [phone]

## 2015-05-05 NOTE — Clinical Social Work Note (Signed)
CSW placed a follow up call to the pt's son [name] regarding 4 bed offers. [name] informed the CSW that he will discuss these offers with his family then inform the CSW of the family decision.    [place], MSW, LCSW [phone]

## 2015-05-05 NOTE — Progress Notes (Signed)
Patient returned from endoscopy. Patient is arousable to pain, although does not open eyes. BP 110/67, SaO2 96% on room air, pulse 56. Will continue to monitor closely.

## 2015-05-05 NOTE — Progress Notes (Signed)
Speech Language Pathology Treatment: Dysphagia  Patient Details Name: [name] MRN: [id number] DOB: 28-Feb-1946 Today's Date: 05/05/2015 Time: 5102-5852 SLP Time Calculation (min) (ACUTE ONLY): 10 min  Assessment / Plan / Recommendation Clinical Impression  Skilled treatment session focused on addressing dysphagia goals.  Patient and multiple family members present, youngest daughter reports assisting her mother with PO intake which she stated was limited due to not preferring the pureed consistencies.  SLP provided Dys.2 texture trials which patient took 1 bite of and then declined.  Pureed trials were also limited despite daughter reporting that patient liked applesauce; patient was also observed consuming water via straw.  Patient did demonstrate effective oral clearance and no overt s/s of aspiration; however, given that trials were limited SLP recommends more trials of Dys.2 textures with SLP prior to advancement.  Patient and family educated on recommendations; continue with current plan of care.        HPI Other Pertinent Information: [name] is a 69 y.o. female with known HTN, DM and CHF who presents with right sided weakness and slurred speech. MRI shows acute large left ACA territory infarct without hemorrhagic conversion.   Pertinent Vitals Pain Assessment: No/denies pain Faces Pain Scale: Hurts a little bit Pain Location: Pt grimacing slightly with mobility Pain Descriptors / Indicators: Grimacing Pain Intervention(s): Monitored during session;Repositioned  SLP Plan  Continue with current plan of care    Recommendations Diet recommendations: Dysphagia 1 (puree);Thin liquid Liquids provided via: Straw;Cup Medication Administration: Crushed with puree Supervision: Patient able to self feed;Full supervision/cueing for compensatory strategies;Staff to assist with self feeding Compensations: Minimize environmental distractions;Slow rate;Small sips/bites;Follow solids with  liquid Postural Changes and/or Swallow Maneuvers: Seated upright 90 degrees;Upright 30-60 min after meal              Oral Care Recommendations: Oral care BID Follow up Recommendations: 24 hour supervision/assistance;Skilled Nursing facility Plan: Continue with current plan of care    GO    Carmelia Roller., CCC-SLP [phone]  [place] 05/05/2015, 4:40 PM

## 2015-05-05 NOTE — CV Procedure (Signed)
INDICATIONS:   The patient is 69 year old female with possible embolic stroke.  PROCEDURE:  Informed consent was discussed including risks, benefits and alternatives for the procedure.  Risks include, but are not limited to, cough, sore throat, vomiting, nausea, somnolence, esophageal and stomach trauma or perforation, bleeding, low blood pressure, aspiration, pneumonia, infection, trauma to the teeth and death.    Patient was given sedation.  The oropharynx was anesthetized with topical lidocaine.  The transesophageal probe was inserted in the esophagus and stomach and multiple views were obtained.  Agitated saline was used after the transesophageal probe was removed from the body.  The patient was kept under observation until the patient left the procedure room.  The patient left the procedure room in stable condition.   COMPLICATIONS:  There were no immediate complications.  FINDINGS:  1. LEFT VENTRICLE: The left ventricle is normal in structure and preserved function.  Wall motion appeared normal.  No thrombus or masses seen in the left ventricle.  2. RIGHT VENTRICLE:  The right ventricle is small and not well visualized.    3. LEFT ATRIUM:  The left atrium is normal without any thrombus or masses.  4. LEFT ATRIAL APPENDAGE:  The left atrial appendage is free of any thrombus or masses.  5. RIGHT ATRIUM:  The right atrium is free of any thrombus or masses.    6. ATRIAL SEPTUM:  The atrial septum is normal with PFO as seen by sonicated saline injection and color flow.  7. MITRAL VALVE:  The mitral valve is normal in structure and function withminimal regurgitation, no masses, stenosis or vegetations.  8. TRICUSPID VALVE:  The tricuspid valve is not well seen. It had minimal regurgitation, no masses, stenosis or vegetations.  9. AORTIC VALVE:  The aortic valve is normal in structure and function without regurgitation, masses, stenosis or vegetations.   10. PULMONIC VALVE:  The pulmonic  valve is normal in structure and function without regurgitation, masses, stenosis or vegetations.  11. AORTIC ARCH, ASCENDING AND DESCENDING AORTA:  The aorta had moderate atherosclerosis in the ascending or descending aorta.  The aortic arch was normal. Aortic root was mildly dilated.  IMPRESSION:   1. Preserved LV systolic function. 2. Normal MV, AV and PV.  3. Inadequate visualization of Tricuspid valve 4. Patent Foramen Ovale. 5. Moderate atherosclerosis with calcification of descending aorta.  RECOMMENDATIONS:    Medical treatment.

## 2015-05-05 NOTE — Progress Notes (Signed)
Occupational Therapy Treatment Patient Details Name: [name] MRN: [id number] DOB: 1946-04-07 Today's Date: 05/05/2015    History of present illness Adm 10/24 with rt sided weakness; MRI + large lt ACA CVA; multiple episodes of decline in status with repeat head CT or MRI's with no further acute changes found  PMHx-CHF, CAD, HTN, tobacco use, DM, HTN   OT comments  Pt more alert and able to participate in therapy today.  Sat EOB and stood x 1 with +2 assist. Flexed posture with tendency to push toward R. Pt with improvement in ability to turn her head to visitors on R side.  Appears to be attempting to verbalize and smiling at times. Pt with slow progress. Unlikely to be able to tolerate the intensity of inpatient rehab.  Updating discharge disposition to SNF.  Follow Up Recommendations  SNF;Supervision/Assistance - 24 hour    Equipment Recommendations       Recommendations for Other Services      Precautions / Restrictions Precautions Precautions: Fall Precaution Comments: dense L side weakness Restrictions Weight Bearing Restrictions: No       Mobility Bed Mobility Overal bed mobility: Needs Assistance Bed Mobility: Rolling;Sidelying to Sit;Sit to Supine Rolling: Max assist Sidelying to sit: Max assist;+2 for physical assistance;HOB elevated   Sit to supine: Max assist;+2 for physical assistance   General bed mobility comments: Pt required +2 assist for all aspects of bed mobility. Hand-over-hand assist to reach for bed rail with LUE.   Transfers Overall transfer level: Needs assistance Equipment used: 2 person hand held assist Transfers: Sit to/from Stand Sit to Stand: Max assist;+2 physical assistance         General transfer comment: R knee block utilized, and pt was able to tolerate static standing EOB for ~20 seconds. Pt was able to hold to therapist's arm for support with LUE.     Balance Overall balance assessment: Needs assistance Sitting-balance  support: Feet supported;Single extremity supported Sitting balance-Leahy Scale: Poor Sitting balance - Comments: Initially pushing to the R side, but was able to briefly hold herself upright without therapist support after leaning onto the L side x2 bouts. Therapist behind pt working on improved posture with pec stretch and back extension. Pt able to scoot her L hip forward on EOB without assist.  Postural control: Posterior lean;Right lateral lean Standing balance support: Bilateral upper extremity supported;During functional activity Standing balance-Leahy Scale: Zero Standing balance comment: Pt was able to improve static standing posture with cues.                    ADL Overall ADL's : Needs assistance/impaired     Grooming: Brushing hair;Maximal assistance;Sitting                       Toileting- Clothing Manipulation and Hygiene: +2 for physical assistance;Total assistance;Sit to/from stand         General ADL Comments: Pt more alert today, attempting to talk.      Vision                 Additional Comments: Pt turning head to R upon command to locate family members/PT.   Perception     Praxis      Cognition   Behavior During Therapy: Flat affect Overall Cognitive Status: Difficult to assess Area of Impairment: Attention;Following commands   Current Attention Level: Sustained    Following Commands: Follows one step commands inconsistently;Follows one step commands with increased  time            Extremity/Trunk Assessment               Exercises     Shoulder Instructions       General Comments      Pertinent Vitals/ Pain       Pain Assessment: Faces Faces Pain Scale: Hurts a little bit Pain Location: Pt grimacing slightly with mobility Pain Descriptors / Indicators: Grimacing Pain Intervention(s): Monitored during session;Repositioned  Home Living                                          Prior  Functioning/Environment              Frequency Min 3X/week     Progress Toward Goals  OT Goals(current goals can now be found in the care plan section)  Progress towards OT goals: Progressing toward goals  Acute Rehab OT Goals Patient Stated Goal: unable to state due to lethargy; decr speech  Plan Discharge plan needs to be updated    Co-evaluation    PT/OT/SLP Co-Evaluation/Treatment: Yes Reason for Co-Treatment: For patient/therapist safety;Complexity of the patient's impairments (multi-system involvement) PT goals addressed during session: Mobility/safety with mobility;Balance OT goals addressed during session: Strengthening/ROM      End of Session Equipment Utilized During Treatment: Gait belt   Activity Tolerance Patient limited by lethargy   Patient Left in bed;with call bell/phone within reach;with family/visitor present   Nurse Communication  (family want applesauce for pt)        Time: 8938-1017 OT Time Calculation (min): 26 min  Charges: OT General Charges $OT Visit: 1 Procedure OT Treatments $Neuromuscular Re-education: 8-22 mins  Malka So 05/05/2015, 3:25 PM  [phone]

## 2015-05-05 NOTE — Progress Notes (Signed)
PT Cancellation Note  Patient Details Name: [name] MRN: [id number] DOB: Jun 22, 1946   Cancelled Treatment:    Reason Eval/Treat Not Completed: Patient at procedure or test/unavailable;Patient's level of consciousness.  Pt off unit at endo during first attempt. When pt arrived back on unit, attempted PT session again and she was too lethargic to participate. Will check back as schedule allows.   Rolinda Roan 05/05/2015, 1:58 PM   Rolinda Roan, PT, DPT Acute Rehabilitation Services Pager: [phone]

## 2015-05-05 NOTE — H&P (View-Only) (Signed)
Ref: Melissa Forward, MD   Subjective:  Awake. Nodes head to undergoTEE procedure.  Objective:  Vital Signs in the last 24 hours: Temp:  [97.6 F (36.4 C)-99 F (37.2 C)] 98.4 F (36.9 C) (10/30 1756) Pulse Rate:  [40-69] 69 (10/30 1756) Cardiac Rhythm:  [-] Normal sinus rhythm (10/30 2000) Resp:  [18-20] 18 (10/30 1756) BP: (125-148)/(64-81) 132/78 mmHg (10/30 1756) SpO2:  [96 %-100 %] 97 % (10/30 1756)  Physical Exam: BP Readings from Last 1 Encounters:  05/04/15 132/78    Wt Readings from Last 1 Encounters:  04/28/15 72.9 kg (160 lb 11.5 oz)    Weight change:   HEENT: [place]/AT, Eyes-Brown, PERL, EOMI, Conjunctiva-Pink, Sclera-Non-icteric Neck: No JVD, No bruit, Trachea midline. Lungs:  Clear, Bilateral. Cardiac:  Regular rhythm, normal S1 and S2, no S3.  Abdomen:  Soft, non-tender. Extremities:  No edema present. No cyanosis. No clubbing. CNS: AxOx3, Cranial nerves grossly intact, 0/5 right sided strength and preserved left sided strength. Skin: Warm and dry.   Intake/Output from previous day: 10/29 0701 - 10/30 0700 In: 600 [P.O.:600] Out: -     Lab Results: BMET    Component Value Date/Time   NA 140 05/02/2015 0236   NA 141 04/28/2015 2000   NA 137 04/28/2015 1420   K 3.7 05/02/2015 0236   K 3.7 04/28/2015 2000   K 3.9 04/28/2015 1420   CL 105 05/02/2015 0236   CL 105 04/28/2015 2000   CL 106 04/28/2015 1420   CO2 23 05/02/2015 0236   CO2 28 04/28/2015 2000   CO2 21* 04/28/2015 1414   GLUCOSE 222* 05/02/2015 0236   GLUCOSE 221* 04/28/2015 2000   GLUCOSE 381* 04/28/2015 1420   BUN 10 05/02/2015 0236   BUN 8 04/28/2015 2000   BUN 12 04/28/2015 1420   CREATININE 0.96 05/02/2015 0236   CREATININE 0.85 04/28/2015 2000   CREATININE 0.90 04/28/2015 1420   CALCIUM 9.9 05/02/2015 0236   CALCIUM 9.9 04/28/2015 2000   CALCIUM 9.3 04/28/2015 1414   GFRNONAA 59* 05/02/2015 0236   GFRNONAA >60 04/28/2015 2000   GFRNONAA 58* 04/28/2015 1414   GFRAA >60  05/02/2015 0236   GFRAA >60 04/28/2015 2000   GFRAA >60 04/28/2015 1414   CBC    Component Value Date/Time   WBC 9.1 05/02/2015 0236   RBC 5.03 05/02/2015 0236   HGB 14.1 05/02/2015 0236   HCT 43.2 05/02/2015 0236   PLT 225 05/02/2015 0236   MCV 85.9 05/02/2015 0236   MCH 28.0 05/02/2015 0236   MCHC 32.6 05/02/2015 0236   RDW 13.2 05/02/2015 0236   LYMPHSABS 2.5 04/28/2015 1414   MONOABS 0.7 04/28/2015 1414   EOSABS 0.1 04/28/2015 1414   BASOSABS 0.0 04/28/2015 1414   HEPATIC Function Panel  Recent Labs  02/07/15 0845 04/28/15 1414 04/28/15 2000  PROT 6.6 6.3* 6.8   HEMOGLOBIN A1C No components found for: HGA1C,  MPG CARDIAC ENZYMES Lab Results  Component Value Date   CKTOTAL 231* 12/23/2010   CKMB 2.8 12/23/2010   TROPONINI <0.30 11/13/2012   TROPONINI <0.30 11/12/2012   TROPONINI <0.30 11/12/2012   BNP No results for input(s): PROBNP in the last 8760 hours. TSH No results for input(s): TSH in the last 8760 hours. CHOLESTEROL  Recent Labs  02/07/15 0845 04/29/15 0439  CHOL 175 212*    Scheduled Meds: . amLODipine  5 mg Oral Daily  . aspirin EC  81 mg Oral Daily  . atorvastatin  40 mg Oral Daily  .  clopidogrel  75 mg Oral Daily  . heparin  5,000 Units Subcutaneous 3 times per day  . insulin aspart  0-9 Units Subcutaneous TID WC  . insulin glargine  20 Units Subcutaneous QHS  . metoprolol succinate  50 mg Oral Daily  . neomycin-bacitracin-polymyxin   Topical BID  . pantoprazole  40 mg Oral Q0600   Continuous Infusions: . sodium chloride 50 mL/hr at 04/29/15 1957   PRN Meds:.acetaminophen, nitroGLYCERIN, senna-docusate  Assessment/Plan: Dominant left anterior cerebral artery infarct with Right sided hemiparesis Dysarthria Moderate to high-grade stenosis right A2 segment.  Mild coronary artery disease in the past.  Uncontrolled hypertension.  Uncontrolled diabetes mellitus.  Compensated congestive heart failure secondary to preserved LV  systolic function.  History of tobacco abuse.  Hyperlipidemia.  Continue medical treatment.    LOS: 6 days    Dixie Dials  MD  05/04/2015, 10:40 PM

## 2015-05-06 ENCOUNTER — Encounter (HOSPITAL_COMMUNITY): Payer: Self-pay | Admitting: Cardiovascular Disease

## 2015-05-06 ENCOUNTER — Inpatient Hospital Stay (HOSPITAL_COMMUNITY): Payer: Medicare Other

## 2015-05-06 DIAGNOSIS — I10 Essential (primary) hypertension: Secondary | ICD-10-CM | POA: Diagnosis not present

## 2015-05-06 DIAGNOSIS — I2510 Atherosclerotic heart disease of native coronary artery without angina pectoris: Secondary | ICD-10-CM | POA: Diagnosis present

## 2015-05-06 DIAGNOSIS — I69359 Hemiplegia and hemiparesis following cerebral infarction affecting unspecified side: Secondary | ICD-10-CM | POA: Diagnosis not present

## 2015-05-06 DIAGNOSIS — R58 Hemorrhage, not elsewhere classified: Secondary | ICD-10-CM | POA: Diagnosis not present

## 2015-05-06 DIAGNOSIS — N390 Urinary tract infection, site not specified: Secondary | ICD-10-CM | POA: Diagnosis present

## 2015-05-06 DIAGNOSIS — Z8673 Personal history of transient ischemic attack (TIA), and cerebral infarction without residual deficits: Secondary | ICD-10-CM | POA: Diagnosis not present

## 2015-05-06 DIAGNOSIS — I110 Hypertensive heart disease with heart failure: Secondary | ICD-10-CM | POA: Diagnosis present

## 2015-05-06 DIAGNOSIS — K921 Melena: Secondary | ICD-10-CM | POA: Diagnosis not present

## 2015-05-06 DIAGNOSIS — I6350 Cerebral infarction due to unspecified occlusion or stenosis of unspecified cerebral artery: Secondary | ICD-10-CM | POA: Diagnosis not present

## 2015-05-06 DIAGNOSIS — I5032 Chronic diastolic (congestive) heart failure: Secondary | ICD-10-CM | POA: Diagnosis not present

## 2015-05-06 DIAGNOSIS — I6611 Occlusion and stenosis of right anterior cerebral artery: Secondary | ICD-10-CM | POA: Diagnosis not present

## 2015-05-06 DIAGNOSIS — K625 Hemorrhage of anus and rectum: Secondary | ICD-10-CM | POA: Diagnosis not present

## 2015-05-06 DIAGNOSIS — Z794 Long term (current) use of insulin: Secondary | ICD-10-CM | POA: Diagnosis not present

## 2015-05-06 DIAGNOSIS — M6281 Muscle weakness (generalized): Secondary | ICD-10-CM | POA: Diagnosis not present

## 2015-05-06 DIAGNOSIS — K922 Gastrointestinal hemorrhage, unspecified: Secondary | ICD-10-CM | POA: Diagnosis not present

## 2015-05-06 DIAGNOSIS — I6789 Other cerebrovascular disease: Secondary | ICD-10-CM | POA: Diagnosis not present

## 2015-05-06 DIAGNOSIS — B9620 Unspecified Escherichia coli [E. coli] as the cause of diseases classified elsewhere: Secondary | ICD-10-CM | POA: Diagnosis present

## 2015-05-06 DIAGNOSIS — K626 Ulcer of anus and rectum: Secondary | ICD-10-CM | POA: Diagnosis not present

## 2015-05-06 DIAGNOSIS — I5042 Chronic combined systolic (congestive) and diastolic (congestive) heart failure: Secondary | ICD-10-CM | POA: Diagnosis not present

## 2015-05-06 DIAGNOSIS — K224 Dyskinesia of esophagus: Secondary | ICD-10-CM | POA: Diagnosis present

## 2015-05-06 DIAGNOSIS — Z8249 Family history of ischemic heart disease and other diseases of the circulatory system: Secondary | ICD-10-CM | POA: Diagnosis not present

## 2015-05-06 DIAGNOSIS — K621 Rectal polyp: Secondary | ICD-10-CM | POA: Diagnosis present

## 2015-05-06 DIAGNOSIS — D62 Acute posthemorrhagic anemia: Secondary | ICD-10-CM | POA: Diagnosis present

## 2015-05-06 DIAGNOSIS — Z993 Dependence on wheelchair: Secondary | ICD-10-CM | POA: Diagnosis not present

## 2015-05-06 DIAGNOSIS — R1319 Other dysphagia: Secondary | ICD-10-CM | POA: Diagnosis present

## 2015-05-06 DIAGNOSIS — Q211 Atrial septal defect: Secondary | ICD-10-CM

## 2015-05-06 DIAGNOSIS — Z7902 Long term (current) use of antithrombotics/antiplatelets: Secondary | ICD-10-CM | POA: Diagnosis not present

## 2015-05-06 DIAGNOSIS — K219 Gastro-esophageal reflux disease without esophagitis: Secondary | ICD-10-CM | POA: Diagnosis present

## 2015-05-06 DIAGNOSIS — F17210 Nicotine dependence, cigarettes, uncomplicated: Secondary | ICD-10-CM | POA: Diagnosis present

## 2015-05-06 DIAGNOSIS — I639 Cerebral infarction, unspecified: Secondary | ICD-10-CM | POA: Diagnosis not present

## 2015-05-06 DIAGNOSIS — E119 Type 2 diabetes mellitus without complications: Secondary | ICD-10-CM | POA: Diagnosis present

## 2015-05-06 DIAGNOSIS — F801 Expressive language disorder: Secondary | ICD-10-CM | POA: Diagnosis not present

## 2015-05-06 DIAGNOSIS — Z7401 Bed confinement status: Secondary | ICD-10-CM | POA: Diagnosis not present

## 2015-05-06 DIAGNOSIS — E785 Hyperlipidemia, unspecified: Secondary | ICD-10-CM | POA: Diagnosis present

## 2015-05-06 DIAGNOSIS — I429 Cardiomyopathy, unspecified: Secondary | ICD-10-CM | POA: Diagnosis present

## 2015-05-06 DIAGNOSIS — K760 Fatty (change of) liver, not elsewhere classified: Secondary | ICD-10-CM | POA: Diagnosis present

## 2015-05-06 DIAGNOSIS — I69321 Dysphasia following cerebral infarction: Secondary | ICD-10-CM | POA: Diagnosis not present

## 2015-05-06 DIAGNOSIS — E1165 Type 2 diabetes mellitus with hyperglycemia: Secondary | ICD-10-CM | POA: Diagnosis not present

## 2015-05-06 DIAGNOSIS — R571 Hypovolemic shock: Secondary | ICD-10-CM | POA: Diagnosis not present

## 2015-05-06 DIAGNOSIS — I69320 Aphasia following cerebral infarction: Secondary | ICD-10-CM | POA: Diagnosis not present

## 2015-05-06 DIAGNOSIS — I69391 Dysphagia following cerebral infarction: Secondary | ICD-10-CM | POA: Diagnosis not present

## 2015-05-06 DIAGNOSIS — Z7901 Long term (current) use of anticoagulants: Secondary | ICD-10-CM | POA: Diagnosis not present

## 2015-05-06 DIAGNOSIS — I69351 Hemiplegia and hemiparesis following cerebral infarction affecting right dominant side: Secondary | ICD-10-CM | POA: Diagnosis not present

## 2015-05-06 DIAGNOSIS — E118 Type 2 diabetes mellitus with unspecified complications: Secondary | ICD-10-CM | POA: Diagnosis not present

## 2015-05-06 LAB — GLUCOSE, CAPILLARY
GLUCOSE-CAPILLARY: 196 mg/dL — AB (ref 65–99)
GLUCOSE-CAPILLARY: 150 mg/dL — AB (ref 65–99)
Glucose-Capillary: 202 mg/dL — ABNORMAL HIGH (ref 65–99)

## 2015-05-06 LAB — PROTIME-INR
INR: 1.02 (ref 0.00–1.49)
Prothrombin Time: 13.6 seconds (ref 11.6–15.2)

## 2015-05-06 MED ORDER — WARFARIN SODIUM 3 MG PO TABS: 3.0000 mg | ORAL_TABLET | Freq: Every day | ORAL | Status: DC

## 2015-05-06 MED ORDER — PATIENT'S GUIDE TO USING COUMADIN BOOK
Freq: Once | Status: AC
  Administered 2015-05-06: 18:00:00
  Filled 2015-05-06: qty 1

## 2015-05-06 MED ORDER — METOPROLOL SUCCINATE ER 50 MG PO TB24: 50.0000 mg | ORAL_TABLET | Freq: Every day | ORAL | Status: DC

## 2015-05-06 MED ORDER — BACITRACIN-NEOMYCIN-POLYMYXIN OINTMENT TUBE: 1.0000 "application " | TOPICAL_OINTMENT | Freq: Two times a day (BID) | CUTANEOUS | Status: DC

## 2015-05-06 MED ORDER — ATORVASTATIN CALCIUM 40 MG PO TABS: 40.0000 mg | ORAL_TABLET | Freq: Every day | ORAL | Status: DC

## 2015-05-06 MED ORDER — CLOPIDOGREL BISULFATE 75 MG PO TABS: 75.0000 mg | ORAL_TABLET | Freq: Every day | ORAL | Status: DC

## 2015-05-06 MED ORDER — INSULIN GLARGINE 100 UNIT/ML ~~LOC~~ SOLN: 20.0000 [IU] | Freq: Every day | SUBCUTANEOUS | Status: DC

## 2015-05-06 MED ORDER — WARFARIN VIDEO: Freq: Once | Status: DC

## 2015-05-06 NOTE — Plan of Care (Signed)
Pt son requested a call with me. I called son [name] and discussed with him over the phone. [name] wants to know whether this time send pt to rehab is a good timing. I told him this is the right time to get her discharged to a SNF and get rehab started. He then asked if he can switch SNFs if he does not satisfied with the one he choose. I told him that he will need to contact social worker in the SNF and get it done if needed. He expressed understanding and appreciation. Pt will have appointment with me in 2 months.  Rosalin Hawking, MD PhD Stroke Neurology 05/06/2015 6:13 PM

## 2015-05-06 NOTE — Clinical Social Work Note (Signed)
CSW spoke with pt's son [name] regarding discharge. [name] shared with the CSW that he wishes to be present during discharge. [name] reported that he will arrive no later than 630pm. CSW explained Terry's desire to the bedside RN.   [place], MSW, LCSW [phone]

## 2015-05-06 NOTE — Clinical Social Work Note (Signed)
CSW spoke with the pt's son [name]. [name] informed the CSW that he would like his mother to transition to [facility]. CSW confirmed a bed available with [facility]. CSW will continue to follow and assist with discharge needs.   [place], MSW, [place]

## 2015-05-06 NOTE — Care Management Note (Signed)
Case Management Note  Patient Details  Name: MICAELLA GITTO MRN: [id number] Date of Birth: 1945-08-23  Subjective/Objective:                    Action/Plan: Patient being discharged to SNF today. No further needs per CM.   Expected Discharge Date:                  Expected Discharge Plan:  [place]  In-House Referral:     Discharge planning Services     Post Acute Care Choice:    Choice offered to:     DME Arranged:    DME Agency:     HH Arranged:    [place] Agency:     Status of Service:  Completed, signed off  Medicare Important Message Given:    Date Medicare IM Given:    Medicare IM give by:    Date Additional Medicare IM Given:    Additional Medicare Important Message give by:     If discussed at [place] of Stay Meetings, dates discussed:    Additional Comments:  Pollie Friar, RN 05/06/2015, 2:53 PM

## 2015-05-06 NOTE — Progress Notes (Signed)
VASCULAR LAB PRELIMINARY  PRELIMINARY  PRELIMINARY  PRELIMINARY  Bilateral lower extremity venous duplex completed.    Preliminary report:  Bilateral:  No evidence of DVT, superficial thrombosis, or Baker's Cyst.   Lindley Hiney, RVS 05/06/2015, 10:30 AM

## 2015-05-06 NOTE — Discharge Instructions (Signed)
Ischemic Stroke Treated With Warfarin An ischemic stroke (cerebrovascular accident) is the sudden death of brain tissue. It is a medical emergency. An ischemic stroke can cause permanent loss of brain function. This can cause problems with different parts of your body. CAUSES An ischemic stroke is caused by a decrease of oxygen supply to an area of your brain. It is usually the result of a small blood clot (embolus) or collection of cholesterol or fat (plaque) that blocks blood flow in the brain. An ischemic stroke can also be caused by blocked or damaged carotid arteries. RISK FACTORS  High blood pressure (hypertension).  High cholesterol.  Diabetes mellitus.  Heart disease.  The buildup of plaque in the blood vessels (peripheral artery disease or atherosclerosis).  The buildup of plaque in the blood vessels that provide blood and oxygen to the brain (carotid artery stenosis).  An abnormal heart rhythm (atrial fibrillation).  Obesity.  Smoking cigarettes.  Taking oral contraceptives, especially in combination with using tobacco.  Physical inactivity.  A diet that is high in fats, salt (sodium), and calories.  Excessive alcohol use.  Use of illegal drugs, especially cocaine and methamphetamine.  Being African American.  Being over the age of 55 years.  Family history of stroke.  Previous history of blood clots, stroke, TIA (transient ischemic attack), or heart attack.  Sickle cell disease. SIGNS AND SYMPTOMS These symptoms usually develop suddenly, or you may notice them after waking up from sleep. Symptoms may include sudden:  Weakness or numbness of your face, arm, or leg, especially on one side of your body.  Confusion.  Trouble speaking (aphasia) or understanding speech.  Trouble seeing with one or both eyes.  Trouble walking or difficulty moving your arms or legs.  Dizziness.  Loss of balance or coordination.  Severe headache with no known cause. The  headache is often described as the worst headache ever experienced. DIAGNOSIS Your health care provider can often determine the presence or absence of an ischemic stroke based on your symptoms, history, and physical exam. CT (computed tomography) of the brain is usually performed to confirm the stroke, determine causes, and determine stroke severity. Other tests may be done to find the cause of the stroke. These tests may include:  ECG (electrocardiogram).  Continuous heart monitoring.  Echocardiogram.  Carotid ultrasound.  MRI.  A scan of the brain circulation.  Blood tests. TREATMENT It is very important to seek treatment at the first sign of stroke symptoms. Your health care provider may perform the following treatments within 6 hours of the onset of stroke symptoms:  Medicine to dissolve the blood clot (thrombolytic).  Inserting a device into the affected artery to remove the blood clot. These treatments may not be effective if too much time has passed since your stroke symptoms began. Even if you do not know when your symptoms began, get treatment as soon as possible. There are other treatment options that may be given, such as:  Oxygen.  IV fluids.  Medicines to thin the blood (anticoagulants).  A procedure to widen blocked arteries. Your treatment will depend on how long you have had your symptoms, the severity of your symptoms, and the cause of your symptoms. Your health care provider will take measures to prevent short-term and long-term complications of stroke, such as:  Breathing foreign material into the lungs (aspiration pneumonia).  Blood clots in the legs.  Bedsores.  Falls. Medicines and dietary changes may be used to help treat and manage risk factors for   stroke, such as diabetes and high blood pressure. If any of your body's functions were impaired by stroke, you may work with physical, speech, or occupational therapists to help you recover. HOME CARE  INSTRUCTIONS  Take medicines only as directed by your health care provider. Follow the directions carefully. Medicines may be used to control risk factors for a stroke. Be sure that you understand all your medicine instructions.  You may be told to take a medicine to thin your blood, such as aspirin or the anticoagulant warfarin. Warfarin needs to be taken exactly as instructed.  Be aware that too much and too little warfarin are both dangerous. Too much warfarin increases the risk of bleeding. Too little warfarin continues to allow the risk of blood clots. While taking warfarin, you will need to have blood tests regularly to measure your blood clotting time. These blood tests usually include both the PT (prothrombin time) and INR (international normalized ratio) tests. The PT and INR results allow your health care provider to adjust your dose of warfarin. The dose can change for many reasons. It is very important that you have your PT and INR levels tested as often as directed by your health care provider and that you have them drawn exactly as directed. It is critically important that you take warfarin exactly as prescribed.  Avoid major changes in your diet, or notify your health care provider before changing your diet. Arrange a visit with a dietitian to answer your questions. Many foods, especially foods that are high in vitamin K, can interfere with warfarin and affect the PT and INR results. You should eat a consistent amount of foods that are high in vitamin K. These include spinach, kale, broccoli, cabbage, collard greens, turnip greens, Brussels sprouts, peas, cauliflower, seaweed, parsley, beef liver, pork liver, green tea, and soybean oil.  Many medicines can interfere with warfarin and affect the PT and INR results. You must tell your health care provider about any and all medicines, vitamins, and supplements you take, including aspirin and other over-the-counter anti-inflammatory medicines.  Be especially cautious with aspirin and anti-inflammatory medicines. Ask your health care provider before taking these. Do not start taking or stop taking any prescribed medicine or over-the-counter medicine, except on the advice of your health care provider or pharmacist.  Warfarin can have side effects, such as easy bruising or difficulty in stopping bleeding. You will need to hold pressure over cuts for longer than usual. Ask your health care provider or pharmacist about other potential side effects of warfarin.  Avoid sports or activities that may cause injury or bleeding.  Be mindful when shaving, flossing your teeth, or handling sharp objects.  Drinking alcohol frequently can increase the effect of warfarin, leading to excess bleeding. It is best to avoid alcoholic drinks or to consume only very small amounts while taking warfarin. Notify your health care provider if you change your alcohol intake.  Notify your dentist or other health care providers before you undergo any procedures in which bleeding may occur.  If swallow studies have determined that your swallowing reflex is present, you should eat healthy foods. Foods may need to be a soft or pureed consistency, or you may need to take small bites in order to avoid aspirating or choking.  Follow physical activity guidelines as directed by your health care team.  Do not use any tobacco products, including cigarettes, chewing tobacco, or electronic cigarettes. If you smoke, quit. If you need help quitting, ask your health care   provider.  A safe home environment is important to reduce the risk of falls. Your health care provider may arrange for specialists to evaluate your home. Having grab bars in the bedroom and bathroom is often important. Your health care provider may arrange for equipment to be used at home, such as raised toilets and a seat for the shower.  Ongoing physical, occupational, and speech therapy may be needed to maximize  your recovery after a stroke. If you have been advised to use a walker or a cane, use it at all times. Be sure to keep your therapy appointments.  Keep all follow-up visits with your health care provider. This is very important. This includes any referrals, therapy, rehabilitation, and lab tests. Proper follow-up can prevent another stroke from occurring. PREVENTION The risk of a stroke can be decreased by appropriately treating high blood pressure, high cholesterol, diabetes, heart disease, and obesity. It can also be decreased by quitting smoking, limiting alcohol, and staying physically active. SEEK IMMEDIATE MEDICAL CARE IF:  You have sudden weakness or numbness of your face, arm, or leg, especially on one side of your body.  You have sudden confusion.  You have sudden trouble speaking (aphasia) or understanding.  You have sudden trouble seeing with one or both eyes.  You have sudden trouble walking or difficulty moving your arms or legs.  You have sudden dizziness.  You have a sudden loss of balance or coordination.  You have a sudden, severe headache with no known cause.  You have a partial or total loss of consciousness. Any of these symptoms may represent a serious problem that is an emergency. Do not wait to see if the symptoms will go away. Get medical help right away. Call your local emergency services (911 in [place] Do not drive yourself to the hospital.   This information is not intended to replace advice given to you by your health care provider. Make sure you discuss any questions you have with your health care provider.   Document Released: 06/21/2005 Document Revised: 07/12/2014 Document Reviewed: 12/10/2013 Elsevier Interactive Patient Education 2016 [employer]  

## 2015-05-06 NOTE — Discharge Summary (Signed)
Priority discharge summary dictated on 05/05/2014, dictation [phone]

## 2015-05-06 NOTE — Discharge Summary (Signed)
[name], [name]               ACCOUNT NO.:  [id number]  MEDICAL RECORD NO.:  [id number]  LOCATION:  5M18C                        FACILITY:  [facility]  PHYSICIAN:  Kourtlynn Trevor N. Terrence Dupont, M.D. DATE OF BIRTH:  11-Mar-1946  DATE OF ADMISSION:  04/28/2015 DATE OF DISCHARGE:  05/06/2015                              DISCHARGE SUMMARY   ADMITTING DIAGNOSES: 1. Probable left cerebrovascular accident with right paresis. 2. Mild coronary artery disease. 3. Hypertension. 4. Diabetes mellitus. 5. History of congestive heart failure secondary to preserved left     ventricular systolic function. 6. History of tobacco abuse. 7. Hyperlipidemia.  FINAL DIAGNOSES: 1. Evolving left anterior cerebral artery infarct in the setting of A2     occlusion, possible large vessel atheroma, cannot rule out embolic     event. 2. Moderate to high-grade stenosis of right A2 segment. 3. Mild coronary artery disease in the past. 4. Hypertension. 5. Diabetes mellitus. 6. Compensated congestive heart failure secondary to preserved left     ventricular systolic function. 7. History of tobacco abuse. 8. Hyperlipidemia. 9. Status post TEE, noted to have patent foramen ovale.  MEDICATIONS:  Discharge home medications are: 1. Clopidogrel 75 mg 1 tablet daily. 2. Lantus insulin 20 units subcu daily at night. 3. Metoprolol succinate 50 mg daily. 4. Neosporin ointment apply locally twice daily. 5. Warfarin 3 mg 1 tablet daily at night. 6. Amlodipine 5 mg daily. 7. Nitrostat sublingual p.r.n. 8. Protonix 40 mg daily. 9. Atorvastatin 40 mg daily.  DIET:  Low-salt, low-cholesterol, 1800 calorie ADA/dysphagia 1 q.8 h. thin liquid diet.  Monitor blood sugar twice daily and chart.  The patient will be transferred to [facility] with rehab.  FOLLOWUP:  Follow up with me in 1 week.  Check PT/INR in 1 week.  CONDITION AT DISCHARGE:  Stable.  BRIEF HISTORY AND HOSPITAL COURSE:  Ms. Tool is a  69 year old female with past medical history significant for mild coronary artery disease, hypertension, diabetes mellitus, hyperlipidemia, history of tobacco abuse in the past, history of congestive heart failure secondary to preserved LV systolic function.  She came to the ER by EMS as the patient was noted by the family to have right-sided weakness and slurred speech and confusion.  The patient states she had similar symptoms last night around 12:30 a.m.  She did not seek any medical attention.  Denies any chest pain, shortness of breath.  Denies palpitation, lightheadedness, or syncope.  Denies headache.  She was earlier seen today by Neuro in consultation and was felt not to be the candidate for thrombolytic therapy as the patient was out of window with delayed presentation.  PHYSICAL EXAMINATION:  GENERAL:  She was awake, but dizzy with slurred speech. VITAL SIGNS:  Blood pressure was 154/97, pulse 78.  She was afebrile. HEENT:  Conjunctivae were pink. NECK:  Supple.  No JVD.  No bruit. LUNGS:  Clear to auscultation without rhonchi or rales. CARDIOVASCULAR:  S1, S2 was normal.  There was soft systolic murmur.  No S3 gallop. ABDOMEN:  Soft.  Bowel sounds were present.  Nontender. EXTREMITIES:  There is no clubbing, cyanosis, or edema. NEURO:  She was alert, oriented to person.  Motor strength was 4/5 in the right upper extremity and lower extremity, and 5/5 in the left upper extremity and lower extremities.  Plantars were downgoing.  LABORATORY DATA:  Her sodium was 141, potassium 3.7, BUN was 8, creatinine 0.85, glucose 286.  Hemoglobin was 14.5, hematocrit 44.5.  CT of the brain done in the ED showed no acute abnormality.  The patient had repeat CT of the brain AND MRI.  Repeat CT done on 27TH showed acute left ACA territory infarct without evidence of progression.  No interval finding to explain neuro deficit.  Tiny hyperintensity in the posterior left cerebral cortex noted,  favor volume averaging or petechial hemorrhage.  The patient had MRI and MRA of the brain, which showed acute large left anterior cerebral artery territory infarct without hemorrhagic conversion, moderate chronic small vessel ischemic disease. MRA of the head showed acute left A2 occlusion compatible with thromboembolic disease, moderate to high-grade stenosis right A2 segment.  Repeat MRI done on May 01, 2015, showed stable and extent of left ACA territory, nonhemorrhagic infarct with expected evolution of T2 changes, stable atrophy of T2 white matter changes, otherwise no significant interval change noted.  The patient had TEE done, which showed small PFO.  Duplex vascular ultrasound of the lower extremity showed no evidence of deep venous thrombosis.  BRIEF HOSPITAL COURSE:  The patient was admitted to telemetry unit. Neuro consultation was obtained.  The patient had progressive weakness in the right upper and lower extremity.  The patient had multiple repeat CT of the brain and MRI, which showed no progression of the infarct. Rehab consultation was also obtained.  The patient was felt not to be a good candidate for inpatient rehab.  Social Service consultation, Speech Therapy, and OT/PT consultation were obtained.  Also Social Service consultation was obtained for skilled nursing placement/rehab.  The patient had bed available at [facility], which family has accepted.  The patient will be transferred to rehab skilled nursing facility today and will be followed up in my office in 1 week and will be followed up by Neurology as outpatient in 2 weeks.     Allegra Lai. Terrence Dupont, M.D.     MNH/MEDQ  D:  05/06/2015  T:  05/06/2015  Job:  [id number]

## 2015-05-06 NOTE — Clinical Social Work Placement (Signed)
   CLINICAL SOCIAL WORK PLACEMENT  NOTE  Date:  05/06/2015  Patient Details  Name: [name] MRN: [id number] Date of Birth: Dec 20, 1945  Clinical Social Work is seeking post-discharge placement for this patient at the [facility] level of care (*CSW will initial, date and re-position this form in  chart as items are completed):  Yes   Patient/family provided with [facility] Work Department's list of facilities offering this level of care within the geographic area requested by the patient (or if unable, by the patient's family).  Yes   Patient/family informed of their freedom to choose among providers that offer the needed level of care, that participate in Medicare, Medicaid or managed care program needed by the patient, have an available bed and are willing to accept the patient.  Yes   Patient/family informed of [facility] ownership interest in [facility] and [facility], as well as of the fact that they are under no obligation to receive care at these facilities.  PASRR submitted to EDS on 05/04/15     PASRR number received on 05/04/15     Existing PASRR number confirmed on       FL2 transmitted to all facilities in geographic area requested by pt/family on 05/04/15     FL2 transmitted to all facilities within larger geographic area on 05/04/15     Patient informed that his/her managed care company has contracts with or will negotiate with certain facilities, including the following:        Yes   Patient/family informed of bed offers received.  Patient chooses bed at [facility]     Physician recommends and patient chooses bed at      Patient to be transferred to [facility] on 05/06/15.  Patient to be transferred to facility by [facility]      Patient family notified on 05/06/15 of transfer.  Name of family member notified:   Costella Hatcher, son      PHYSICIAN        Additional Comment:    _______________________________________________ Greta Doom, LCSW 05/06/2015, 3:39 PM

## 2015-05-06 NOTE — Progress Notes (Signed)
Pt Discharged, assisted EMS with transfer to stretcher, IV removed, destination orders reviewed, all questioned answered for family at time of discharge, telemetry discontinued, Pt without obvious distress.

## 2015-05-07 DIAGNOSIS — I6789 Other cerebrovascular disease: Secondary | ICD-10-CM | POA: Diagnosis not present

## 2015-05-07 DIAGNOSIS — I5042 Chronic combined systolic (congestive) and diastolic (congestive) heart failure: Secondary | ICD-10-CM | POA: Diagnosis not present

## 2015-05-07 DIAGNOSIS — E1165 Type 2 diabetes mellitus with hyperglycemia: Secondary | ICD-10-CM | POA: Diagnosis not present

## 2015-05-07 DIAGNOSIS — I69359 Hemiplegia and hemiparesis following cerebral infarction affecting unspecified side: Secondary | ICD-10-CM | POA: Diagnosis not present

## 2015-05-13 ENCOUNTER — Inpatient Hospital Stay (HOSPITAL_COMMUNITY)
Admission: EM | Admit: 2015-05-13 | Discharge: 2015-05-21 | DRG: 347 | Disposition: A | Discharge status: Skilled Nursing Facility | Payer: Medicare Other | Attending: Cardiology | Admitting: Cardiology

## 2015-05-13 ENCOUNTER — Encounter (HOSPITAL_COMMUNITY): Payer: Self-pay | Admitting: General Practice

## 2015-05-13 DIAGNOSIS — K626 Ulcer of anus and rectum: Principal | ICD-10-CM | POA: Diagnosis present

## 2015-05-13 DIAGNOSIS — I429 Cardiomyopathy, unspecified: Secondary | ICD-10-CM | POA: Diagnosis present

## 2015-05-13 DIAGNOSIS — K760 Fatty (change of) liver, not elsewhere classified: Secondary | ICD-10-CM | POA: Diagnosis present

## 2015-05-13 DIAGNOSIS — I5032 Chronic diastolic (congestive) heart failure: Secondary | ICD-10-CM | POA: Diagnosis present

## 2015-05-13 DIAGNOSIS — B9620 Unspecified Escherichia coli [E. coli] as the cause of diseases classified elsewhere: Secondary | ICD-10-CM | POA: Diagnosis present

## 2015-05-13 DIAGNOSIS — N390 Urinary tract infection, site not specified: Secondary | ICD-10-CM | POA: Diagnosis present

## 2015-05-13 DIAGNOSIS — K921 Melena: Secondary | ICD-10-CM | POA: Diagnosis not present

## 2015-05-13 DIAGNOSIS — I110 Hypertensive heart disease with heart failure: Secondary | ICD-10-CM | POA: Diagnosis present

## 2015-05-13 DIAGNOSIS — J9600 Acute respiratory failure, unspecified whether with hypoxia or hypercapnia: Secondary | ICD-10-CM

## 2015-05-13 DIAGNOSIS — R571 Hypovolemic shock: Secondary | ICD-10-CM | POA: Diagnosis not present

## 2015-05-13 DIAGNOSIS — F17210 Nicotine dependence, cigarettes, uncomplicated: Secondary | ICD-10-CM | POA: Diagnosis present

## 2015-05-13 DIAGNOSIS — I509 Heart failure, unspecified: Secondary | ICD-10-CM

## 2015-05-13 DIAGNOSIS — Z794 Long term (current) use of insulin: Secondary | ICD-10-CM

## 2015-05-13 DIAGNOSIS — Q211 Atrial septal defect: Secondary | ICD-10-CM

## 2015-05-13 DIAGNOSIS — I69351 Hemiplegia and hemiparesis following cerebral infarction affecting right dominant side: Secondary | ICD-10-CM

## 2015-05-13 DIAGNOSIS — K922 Gastrointestinal hemorrhage, unspecified: Secondary | ICD-10-CM | POA: Diagnosis not present

## 2015-05-13 DIAGNOSIS — I69320 Aphasia following cerebral infarction: Secondary | ICD-10-CM

## 2015-05-13 DIAGNOSIS — D62 Acute posthemorrhagic anemia: Secondary | ICD-10-CM | POA: Diagnosis present

## 2015-05-13 DIAGNOSIS — K224 Dyskinesia of esophagus: Secondary | ICD-10-CM | POA: Diagnosis present

## 2015-05-13 DIAGNOSIS — Z7901 Long term (current) use of anticoagulants: Secondary | ICD-10-CM

## 2015-05-13 DIAGNOSIS — K219 Gastro-esophageal reflux disease without esophagitis: Secondary | ICD-10-CM | POA: Diagnosis present

## 2015-05-13 DIAGNOSIS — I10 Essential (primary) hypertension: Secondary | ICD-10-CM | POA: Diagnosis present

## 2015-05-13 DIAGNOSIS — I639 Cerebral infarction, unspecified: Secondary | ICD-10-CM | POA: Diagnosis present

## 2015-05-13 DIAGNOSIS — Z8249 Family history of ischemic heart disease and other diseases of the circulatory system: Secondary | ICD-10-CM

## 2015-05-13 DIAGNOSIS — I69391 Dysphagia following cerebral infarction: Secondary | ICD-10-CM

## 2015-05-13 DIAGNOSIS — R1319 Other dysphagia: Secondary | ICD-10-CM | POA: Diagnosis present

## 2015-05-13 DIAGNOSIS — E785 Hyperlipidemia, unspecified: Secondary | ICD-10-CM | POA: Diagnosis present

## 2015-05-13 DIAGNOSIS — Z7902 Long term (current) use of antithrombotics/antiplatelets: Secondary | ICD-10-CM

## 2015-05-13 DIAGNOSIS — K625 Hemorrhage of anus and rectum: Secondary | ICD-10-CM | POA: Diagnosis present

## 2015-05-13 DIAGNOSIS — Z993 Dependence on wheelchair: Secondary | ICD-10-CM

## 2015-05-13 DIAGNOSIS — Z789 Other specified health status: Secondary | ICD-10-CM

## 2015-05-13 DIAGNOSIS — K621 Rectal polyp: Secondary | ICD-10-CM | POA: Diagnosis present

## 2015-05-13 DIAGNOSIS — I2510 Atherosclerotic heart disease of native coronary artery without angina pectoris: Secondary | ICD-10-CM | POA: Diagnosis present

## 2015-05-13 DIAGNOSIS — Z7401 Bed confinement status: Secondary | ICD-10-CM

## 2015-05-13 DIAGNOSIS — Z8673 Personal history of transient ischemic attack (TIA), and cerebral infarction without residual deficits: Secondary | ICD-10-CM | POA: Insufficient documentation

## 2015-05-13 DIAGNOSIS — E119 Type 2 diabetes mellitus without complications: Secondary | ICD-10-CM

## 2015-05-13 DIAGNOSIS — R0600 Dyspnea, unspecified: Secondary | ICD-10-CM

## 2015-05-13 DIAGNOSIS — E118 Type 2 diabetes mellitus with unspecified complications: Secondary | ICD-10-CM

## 2015-05-13 HISTORY — DX: Cerebral infarction, unspecified: I63.9

## 2015-05-13 HISTORY — DX: Ulcer of anus and rectum: K62.6

## 2015-05-13 HISTORY — DX: Atrial septal defect: Q21.1

## 2015-05-13 HISTORY — DX: Benign neoplasm of bone and articular cartilage, unspecified: D16.9

## 2015-05-13 LAB — COMPREHENSIVE METABOLIC PANEL
ALBUMIN: 3.6 g/dL (ref 3.5–5.0)
ALT: 33 U/L (ref 14–54)
AST: 33 U/L (ref 15–41)
Alkaline Phosphatase: 61 U/L (ref 38–126)
Anion gap: 11 (ref 5–15)
BUN: 22 mg/dL — AB (ref 6–20)
CHLORIDE: 101 mmol/L (ref 101–111)
CO2: 23 mmol/L (ref 22–32)
Calcium: 9.5 mg/dL (ref 8.9–10.3)
Creatinine, Ser: 0.9 mg/dL (ref 0.44–1.00)
GFR calc Af Amer: >60 mL/min (ref >60)
GLUCOSE: 115 mg/dL — AB (ref 65–99)
POTASSIUM: 4.4 mmol/L (ref 3.5–5.1)
SODIUM: 135 mmol/L (ref 135–145)
Total Bilirubin: 0.6 mg/dL (ref 0.3–1.2)
Total Protein: 6.1 g/dL — ABNORMAL LOW (ref 6.5–8.1)

## 2015-05-13 LAB — CBC WITH DIFFERENTIAL/PLATELET
BASOS ABS: 0.2 10*3/uL — AB (ref 0.0–0.1)
Basophils Relative: 1 %
EOS PCT: 1 %
Eosinophils Absolute: 0.2 10*3/uL (ref 0.0–0.7)
HCT: 46.4 % — ABNORMAL HIGH (ref 36.0–46.0)
Hemoglobin: 15.5 g/dL — ABNORMAL HIGH (ref 12.0–15.0)
LYMPHS PCT: 30 %
Lymphs Abs: 5 10*3/uL — ABNORMAL HIGH (ref 0.7–4.0)
MCH: 28.5 pg (ref 26.0–34.0)
MCHC: 33.4 g/dL (ref 30.0–36.0)
MCV: 85.5 fL (ref 78.0–100.0)
MONO ABS: 0.8 10*3/uL (ref 0.1–1.0)
MONOS PCT: 5 %
Neutro Abs: 10.5 10*3/uL — ABNORMAL HIGH (ref 1.7–7.7)
Neutrophils Relative %: 63 %
PLATELETS: 281 10*3/uL (ref 150–400)
RBC: 5.43 MIL/uL — ABNORMAL HIGH (ref 3.87–5.11)
RDW: 12.9 % (ref 11.5–15.5)
WBC: 16.7 10*3/uL — ABNORMAL HIGH (ref 4.0–10.5)

## 2015-05-13 LAB — TYPE AND SCREEN
ABO/RH(D): A POS
Antibody Screen: NEGATIVE

## 2015-05-13 LAB — GLUCOSE, CAPILLARY
GLUCOSE-CAPILLARY: 104 mg/dL — AB (ref 65–99)
GLUCOSE-CAPILLARY: 264 mg/dL — AB (ref 65–99)

## 2015-05-13 LAB — CBC
HCT: 43.7 % (ref 36.0–46.0)
HEMOGLOBIN: 14.3 g/dL (ref 12.0–15.0)
MCH: 28 pg (ref 26.0–34.0)
MCHC: 32.7 g/dL (ref 30.0–36.0)
MCV: 85.7 fL (ref 78.0–100.0)
PLATELETS: 262 10*3/uL (ref 150–400)
RBC: 5.1 MIL/uL (ref 3.87–5.11)
RDW: 12.8 % (ref 11.5–15.5)
WBC: 15 10*3/uL — AB (ref 4.0–10.5)

## 2015-05-13 LAB — PROTIME-INR
INR: 1.76 — AB (ref 0.00–1.49)
PROTHROMBIN TIME: 20.5 s — AB (ref 11.6–15.2)

## 2015-05-13 MED ORDER — AMLODIPINE BESYLATE 5 MG PO TABS: 5.0000 mg | ORAL_TABLET | Freq: Every day | ORAL | Status: DC

## 2015-05-13 MED ORDER — SODIUM CHLORIDE 0.9 % IV SOLN
INTRAVENOUS | Status: AC
  Administered 2015-05-13: 18:00:00 via INTRAVENOUS

## 2015-05-13 MED ORDER — ONDANSETRON HCL 4 MG PO TABS: 4.0000 mg | ORAL_TABLET | Freq: Four times a day (QID) | ORAL | Status: DC | PRN

## 2015-05-13 MED ORDER — PANTOPRAZOLE SODIUM 40 MG PO TBEC
40.0000 mg | DELAYED_RELEASE_TABLET | Freq: Every day | ORAL | Status: DC
  Administered 2015-05-14 – 2015-05-21 (×6): 40 mg via ORAL
  Filled 2015-05-13 (×6): qty 1

## 2015-05-13 MED ORDER — INSULIN ASPART 100 UNIT/ML ~~LOC~~ SOLN
0.0000 [IU] | Freq: Every day | SUBCUTANEOUS | Status: DC
  Administered 2015-05-13: 3 [IU] via SUBCUTANEOUS

## 2015-05-13 MED ORDER — ACETAMINOPHEN 650 MG RE SUPP: 650.0000 mg | Freq: Four times a day (QID) | RECTAL | Status: DC | PRN

## 2015-05-13 MED ORDER — INSULIN ASPART 100 UNIT/ML ~~LOC~~ SOLN
0.0000 [IU] | Freq: Three times a day (TID) | SUBCUTANEOUS | Status: DC
  Administered 2015-05-14: 2 [IU] via SUBCUTANEOUS
  Administered 2015-05-14 – 2015-05-19 (×5): 3 [IU] via SUBCUTANEOUS
  Administered 2015-05-19 – 2015-05-20 (×3): 2 [IU] via SUBCUTANEOUS
  Administered 2015-05-20 (×2): 3 [IU] via SUBCUTANEOUS
  Administered 2015-05-21: 8 [IU] via SUBCUTANEOUS
  Administered 2015-05-21: 3 [IU] via SUBCUTANEOUS
  Administered 2015-05-21: 5 [IU] via SUBCUTANEOUS

## 2015-05-13 MED ORDER — INSULIN GLARGINE 100 UNIT/ML ~~LOC~~ SOLN
10.0000 [IU] | Freq: Every day | SUBCUTANEOUS | Status: DC
  Administered 2015-05-13 – 2015-05-15 (×3): 10 [IU] via SUBCUTANEOUS
  Filled 2015-05-13 (×5): qty 0.1

## 2015-05-13 MED ORDER — ONDANSETRON HCL 4 MG/2ML IJ SOLN: 4.0000 mg | Freq: Three times a day (TID) | INTRAMUSCULAR | Status: DC | PRN

## 2015-05-13 MED ORDER — ONDANSETRON HCL 4 MG/2ML IJ SOLN
4.0000 mg | Freq: Four times a day (QID) | INTRAMUSCULAR | Status: DC | PRN
  Filled 2015-05-13: qty 2

## 2015-05-13 MED ORDER — ALUM & MAG HYDROXIDE-SIMETH 200-200-20 MG/5ML PO SUSP
30.0000 mL | Freq: Four times a day (QID) | ORAL | Status: DC | PRN
  Filled 2015-05-13: qty 30

## 2015-05-13 MED ORDER — ACETAMINOPHEN 325 MG PO TABS: 650.0000 mg | ORAL_TABLET | Freq: Four times a day (QID) | ORAL | Status: DC | PRN

## 2015-05-13 MED ORDER — ATORVASTATIN CALCIUM 40 MG PO TABS
40.0000 mg | ORAL_TABLET | Freq: Every day | ORAL | Status: DC
  Administered 2015-05-14 – 2015-05-21 (×5): 40 mg via ORAL
  Filled 2015-05-13 (×7): qty 1

## 2015-05-13 NOTE — ED Notes (Signed)
Returned phone call to patients son [name].

## 2015-05-13 NOTE — ED Provider Notes (Signed)
CSN: [id number]     Arrival date & time 05/13/15  1239 History   First MD Initiated Contact with Patient 05/13/15 1247     Chief Complaint  Patient presents with  . Blood In Stools     (Consider location/radiation/quality/duration/timing/severity/associated sxs/prior Treatment) HPI   [name] is a 69 y.o. female, with a history of CHF, DM, and HTN, presenting to the ED with lower GI bleeding.  Pt brought in via [facility]. Pt is in inpatient rehab at [facility] following a stroke 8 days ago. Pt had a positive hemoccult today, and was sent to ED for further evaluation. Pt is A/O per baseline. Pt is only able to answer question with yes or no. Pt has right sided deficits from stroke. Pt denies pain or feeling any different than normal. Denies chest pain, shortness of breath, abdominal pain, N/V/C/D, and denies any other complaints. When asked if anything was bothering her, pt replied, "No."   Past Medical History  Diagnosis Date  . CHF (congestive heart failure) (HCC)     EF 80-03%, grade 1 diastolic dysfunction per echo 04/2015  . Diabetes ([place]) 2016    Type II. On insulin  . Hypertension   . CVA (cerebral infarction) 04/2015    Started Plavix 04/2015  . Patent foramen ovale 04/2015    Started on warfarin 04/2015  . Enchondroma of bone 2011    left femur.    Past Surgical History  Procedure Laterality Date  . Cardiac surgery      Cath without stent  . Tee without cardioversion N/A 05/05/2015    Procedure: TRANSESOPHAGEAL ECHOCARDIOGRAM (TEE);  Surgeon: Dixie Dials, MD;  Location: [facility] ENDOSCOPY;  Service: Cardiovascular;  Laterality: N/A;   Family History  Problem Relation Age of Onset  . Hypertension Mother   . Heart failure Mother   . Hyperlipidemia Mother    Social History  Substance Use Topics  . Smoking status: Current Every Day Smoker    Types: Cigarettes  . Smokeless tobacco: None  . Alcohol Use: No   OB History    No data available     Review of  Systems  Unable to perform ROS: Other  Constitutional: Negative for fever, chills, diaphoresis and unexpected weight change.  Respiratory: Negative for cough, chest tightness and shortness of breath.   Cardiovascular: Negative for chest pain, palpitations and leg swelling.  Gastrointestinal: Positive for blood in stool and anal bleeding. Negative for nausea, vomiting, abdominal pain, diarrhea and constipation.  Genitourinary: Negative for dysuria and flank pain.  Musculoskeletal: Negative for back pain.  Skin: Negative for color change and pallor.  Neurological: Negative for dizziness, syncope, weakness and light-headedness.      Allergies  Review of patient's allergies indicates no known allergies.  Home Medications   Prior to Admission medications   Medication Sig Start Date End Date Taking? Authorizing Provider  acetaminophen (TYLENOL) 325 MG tablet Take 650 mg by mouth every 6 (six) hours as needed for mild pain.   Yes Historical Provider, MD  amLODipine (NORVASC) 5 MG tablet Take 5 mg by mouth daily.   Yes Historical Provider, MD  atorvastatin (LIPITOR) 40 MG tablet Take 1 tablet (40 mg total) by mouth daily. 05/06/15  Yes Charolette Forward, MD  clopidogrel (PLAVIX) 75 MG tablet Take 1 tablet (75 mg total) by mouth daily. 05/06/15  Yes Charolette Forward, MD  insulin glargine (LANTUS) 100 UNIT/ML injection Inject 0.2 mLs (20 Units total) into the skin at bedtime. 05/06/15  Yes Charolette Forward, MD  insulin regular (NOVOLIN R,HUMULIN R) 100 units/mL injection Inject 0-10 Units into the skin See admin instructions. Per sliding scale: BS 0-69 give 0 units, BS 70-150 give 0 units, BS 151-200 give 2 units, BS 201-250 give 4 units, BS 251-300 give 6 units, BS 301-350 give 8 units, BS 351-400 give 10 units, BS 401 or higher, call MD   Yes Historical Provider, MD  metoprolol succinate (TOPROL-XL) 50 MG 24 hr tablet Take 1 tablet (50 mg total) by mouth daily. 05/06/15  Yes Charolette Forward, MD  miconazole  (MICOTIN) 2 % cream Apply 1 application topically 2 (two) times daily.   Yes Historical Provider, MD  nitroGLYCERIN (NITROSTAT) 0.4 MG SL tablet Place 0.4 mg under the tongue every 5 (five) minutes as needed for chest pain.   Yes Historical Provider, MD  pantoprazole (PROTONIX) 40 MG tablet Take 1 tablet (40 mg total) by mouth daily at 6 (six) AM. 11/14/12  Yes Charolette Forward, MD  warfarin (COUMADIN) 3 MG tablet Take 1 tablet (3 mg total) by mouth daily. Patient taking differently: Take 3.5 mg by mouth daily.  05/06/15  Yes Charolette Forward, MD  neomycin-bacitracin-polymyxin (NEOSPORIN) OINT Apply 1 application topically 2 (two) times daily. Patient not taking: Reported on 05/13/2015 05/06/15   Charolette Forward, MD   BP 108/71 mmHg  Pulse 62  Temp(Src) 97.7 F (36.5 C) (Oral)  Resp 28  SpO2 96% Physical Exam  Constitutional: She appears well-developed and well-nourished. No distress.  HENT:  Head: Normocephalic and atraumatic.  Eyes: Conjunctivae are normal. Pupils are equal, round, and reactive to light.  Cardiovascular: Normal rate, regular rhythm and normal heart sounds.   Pulmonary/Chest: Effort normal and breath sounds normal. No respiratory distress.  Abdominal: Soft. Normal appearance and bowel sounds are normal. She exhibits no distension. There is no tenderness.  Genitourinary:  Active bright red hemorrhage coming out of anus. Blood soaking her diaper and large clots present. Does not appear there is blood coming from vagina.  Musculoskeletal: She exhibits no edema or tenderness.  Neurological: She is alert.  Pt is oriented to her baseline level and can only answer yes or no questions.  Pt is completely flacid on the right due to a recent stroke. Strength in left side is 5/5.  Skin: Skin is warm and dry. She is not diaphoretic. There is pallor.  Nursing note and vitals reviewed.   ED Course  Procedures (including critical care time) Labs Review Labs Reviewed  CBC WITH  DIFFERENTIAL/PLATELET - Abnormal; Notable for the following:    WBC 16.7 (*)    RBC 5.43 (*)    Hemoglobin 15.5 (*)    HCT 46.4 (*)    Neutro Abs 10.5 (*)    Lymphs Abs 5.0 (*)    Basophils Absolute 0.2 (*)    All other components within normal limits  COMPREHENSIVE METABOLIC PANEL - Abnormal; Notable for the following:    Glucose, Bld 115 (*)    BUN 22 (*)    Total Protein 6.1 (*)    All other components within normal limits  PROTIME-INR - Abnormal; Notable for the following:    Prothrombin Time 20.5 (*)    INR 1.76 (*)    All other components within normal limits  CBC  POC OCCULT BLOOD, ED  TYPE AND SCREEN  ABO/RH    Imaging Review No results found. I have personally reviewed and evaluated these images and lab results as part of my medical decision-making.  EKG Interpretation None      MDM   Final diagnoses:  Lower GI bleed  History of stroke    CERYS WINGET presents with active GI bleed.  Findings and plan of care discussed with Leonard Schwartz, MD. Level 5 caveat applies.   Pt vital signs show no signs of serious blood loss at this time, as pt is not hypotensive and not tachycardic. Pt is on warfarin and plavix.  Pt is stable at this time.  1:33 PM Spoke with Azucena Freed from [place]. Stated she would come see pt. No further instructions. 2:29 PM Pt reevaluated and still denies any pain or complaints. Bleeding seems to have slowed or stopped. Patient to be admitted for GI bleed. Hospitalist consult ordered. CBC shows increased white count of 16.7 with left shift.  Hemoglobin is 15.5. CMP shows no significant abnormalities. Patient is subtherapeutic on her INR. Will hold Plavix and warfarin for now. 3:13 PM Spoke with Ms. Renard Hamper, from [facility]. Agreed to admit pt to a telemetry bed with Dr. Ree Kida as the attending. Ms. Glyn Ade from GI service verifies pt can be put on her normal dysphagia diet.   Lorayne Bender, PA-C 05/13/15 [place],  MD 05/14/15 [phone]

## 2015-05-13 NOTE — H&P (Signed)
[facility] History and Physical  [name] WUJ:[id number] DOB: 03-27-1946 DOA: 05/13/2015  Referring physician: joy PCP: Melissa Forward, MD   Chief Complaint: BRBPR  HPI: [name] is a 69 y.o. female with a past medical history that includes CAD, diabetes, left CVA with right hemiparesis 7 days ago presents to the emergency department from [facility] with the chief complaint of bright red blood per rectum. Initial evaluation in the emergency department reveals positive Hemoccult and patient incontinent large amounts of bright red blood with clots during emergency department stay.  Chart review and information reveals patient had a stroke 7 days ago was on Plavix Coumadin and aspirin. She was sent to the emergency department from facility due to bright red blood per rectum. No reports of any abdominal pain nausea vomiting. No reports of decreased oral intake. She denies any pain or discomfort. She denies chest pain palpitation shortness of breath cough. Her answers are limited to yes and no but she follows commands is difficult to understand Workup in the emergency department includes comprehensive metabolic panel significant for glucose of 115 CBC with the PVCs is 16.7 hemoglobin 15.5 hematocrit 46.4. He has been typed and screened in the emergency department. She is afebrile hemodynamically stable and not hypoxic. She is evaluated by GI while in the emergency department upon likely diverticula source will need a colonoscopy him requesting neurology clearance for sedation.  Review of Systems:  10 point review of systems complete and all systems are negative as indicated in the history of present illness Past Medical History  Diagnosis Date  . CHF (congestive heart failure) (HCC)     EF 95-62%, grade 1 diastolic dysfunction per echo 04/2015  . Diabetes ([place]) 2016    Type II. On insulin  . Hypertension   . CVA (cerebral infarction) 04/2015    Started Plavix 04/2015    . Patent foramen ovale 04/2015    Started on warfarin 04/2015  . Enchondroma of bone 2011    left femur.    Past Surgical History  Procedure Laterality Date  . Cardiac surgery      Cath without stent  . Tee without cardioversion N/A 05/05/2015    Procedure: TRANSESOPHAGEAL ECHOCARDIOGRAM (TEE);  Surgeon: Melissa Dials, MD;  Location: [facility] ENDOSCOPY;  Service: Cardiovascular;  Laterality: N/A;   Social History:  reports that she has been smoking Cigarettes.  She does not have any smokeless tobacco history on file. She reports that she does not drink alcohol or use illicit drugs.  No Known Allergies  Family History  Problem Relation Age of Onset  . Hypertension Mother   . Heart failure Mother   . Hyperlipidemia Mother      Prior to Admission medications   Medication Sig Start Date End Date Taking? Authorizing Provider  acetaminophen (TYLENOL) 325 MG tablet Take 650 mg by mouth every 6 (six) hours as needed for mild pain.   Yes Historical Provider, MD  amLODipine (NORVASC) 5 MG tablet Take 5 mg by mouth daily.   Yes Historical Provider, MD  atorvastatin (LIPITOR) 40 MG tablet Take 1 tablet (40 mg total) by mouth daily. 05/06/15  Yes Melissa Forward, MD  clopidogrel (PLAVIX) 75 MG tablet Take 1 tablet (75 mg total) by mouth daily. 05/06/15  Yes Melissa Forward, MD  insulin glargine (LANTUS) 100 UNIT/ML injection Inject 0.2 mLs (20 Units total) into the skin at bedtime. 05/06/15  Yes Melissa Forward, MD  insulin regular (NOVOLIN R,HUMULIN R) 100 units/mL injection Inject  0-10 Units into the skin See admin instructions. Per sliding scale: BS 0-69 give 0 units, BS 70-150 give 0 units, BS 151-200 give 2 units, BS 201-250 give 4 units, BS 251-300 give 6 units, BS 301-350 give 8 units, BS 351-400 give 10 units, BS 401 or higher, call MD   Yes Historical Provider, MD  metoprolol succinate (TOPROL-XL) 50 MG 24 hr tablet Take 1 tablet (50 mg total) by mouth daily. 05/06/15  Yes Melissa Forward, MD  miconazole  (MICOTIN) 2 % cream Apply 1 application topically 2 (two) times daily.   Yes Historical Provider, MD  nitroGLYCERIN (NITROSTAT) 0.4 MG SL tablet Place 0.4 mg under the tongue every 5 (five) minutes as needed for chest pain.   Yes Historical Provider, MD  pantoprazole (PROTONIX) 40 MG tablet Take 1 tablet (40 mg total) by mouth daily at 6 (six) AM. 11/14/12  Yes Melissa Forward, MD  warfarin (COUMADIN) 3 MG tablet Take 1 tablet (3 mg total) by mouth daily. Patient taking differently: Take 3.5 mg by mouth daily.  05/06/15  Yes Melissa Forward, MD   Physical Exam: Filed Vitals:   05/13/15 1430 05/13/15 1445 05/13/15 1500 05/13/15 1515  BP: 111/68 104/71 115/70 112/74  Pulse: 64 62 64 63  Temp:      TempSrc:      Resp: 19 15 16 19   SpO2: 95% 98% 94% 94%    Wt Readings from Last 3 Encounters:  04/28/15 72.9 kg (160 lb 11.5 oz)  11/13/12 74.1 kg (163 lb 5.8 oz)    General:  Appears calm and comfortable Eyes: PERRL, normal lids, irises & conjunctiva ENT: grossly normal hearing, lips & tongue Neck: no LAD, masses or thyromegaly Cardiovascular: RRR, no m/r/g. No LE edema. Telemetry: SR, no arrhythmias  Respiratory: CTA bilaterally, no w/r/r. Normal respiratory effort. Abdomen: soft, ntnd no guarding or rebounding positive bowel sounds Skin: no rash or induration seen on limited exam Musculoskeletal: grossly normal tone BUE/BLE Psychiatric: grossly normal mood and affect, speech fluent and appropriate Neurologic: Answers yes or no to questions attempts to follow commands quite aphasic not able to verbalize right-sided hemiparesis.           Labs on Admission:  Basic Metabolic Panel:  Recent Labs Lab 05/13/15 1259  NA 135  K 4.4  CL 101  CO2 23  GLUCOSE 115*  BUN 22*  CREATININE 0.90  CALCIUM 9.5   Liver Function Tests:  Recent Labs Lab 05/13/15 1259  AST 33  ALT 33  ALKPHOS 61  BILITOT 0.6  PROT 6.1*  ALBUMIN 3.6   No results for input(s): LIPASE, AMYLASE in the last  168 hours. No results for input(s): AMMONIA in the last 168 hours. CBC:  Recent Labs Lab 05/13/15 1259  WBC 16.7*  NEUTROABS 10.5*  HGB 15.5*  HCT 46.4*  MCV 85.5  PLT 281   Cardiac Enzymes: No results for input(s): CKTOTAL, CKMB, CKMBINDEX, TROPONINI in the last 168 hours.  BNP (last 3 results) No results for input(s): BNP in the last 8760 hours.  ProBNP (last 3 results) No results for input(s): PROBNP in the last 8760 hours.  CBG:  Recent Labs Lab 05/06/15 1640  GLUCAP 202*    Radiological Exams on Admission: No results found.  EKG:   Assessment/Plan Principal Problem:   BRBPR (bright red blood per rectum) Active Problems:   Left-sided cerebrovascular accident (CVA) ([place])   GI bleed   CHF (congestive heart failure) ([place])   Diabetes ([place])  Hypertension  #1. Bright red blood per rectum. Denies pain. No reports of any nausea vomiting. Evaluated by gastroenterology who opine may be diverticular bleed in the setting of warfarin Plavix and aspirin. Recommending colonoscopy eventually. Will admit to telemetry. Will obtain serial CBCs. She's been typed and screened for transfusion. No further bleeding while in the emergency department. GI request neuro consult for approval/clearance for conscious sedation. Spoke with Dr. Erlinda Hong who says sedation okay for colonoscopy recommending avoiding hypotension during procedure and perioperative period. He also states upon discharge would discontinue Coumadin hold Plavix for 5 days and can discontinue aspirin as well.  Will hold Plavix aspirin and Coumadin. INR subtherapeutic on admission.  #2. Left-sided cerebral vascular accident. One week ago. Right right hemiparesis. She is at [facility]. She is mostly wheelchair and bedbound. Continue pure diet and thin liquids.  #3. Diabetes, will provide half of her Lantus dose and use sliding scale insulin for optimal control. Obtain a hemoglobin A1c.  #4. CHF diastolic. EF 50%  with grade 1 diastolic dysfunction. Appears compensated. moniter daily weights and intake and output.  #5. Hypertension. Fair control. Home medications include amlodipine. Toprol. Will continue the amlodipine and hold the beta blocker for now. Monitor closely and resume as indicated.   [facility]  Have paged Dr Erlinda Hong  Code Status: full DVT Prophylaxis: Family Communication: husband at bedside Disposition Plan: back to facility when ready  Time spent: 46 minutes  [facility]

## 2015-05-13 NOTE — Consult Note (Signed)
[facility] Consult: 2:56 PM 05/13/2015     Referring Provider: ED MD  Primary Care Physician:  Charolette Forward, MD Primary Gastroenterologist:  unassigned    Reason for Consultation:  Painless hematochezia.    HPI: [name] is a 69 y.o. female.  Hx esophageal motility disorder, dysphagia on esophagram 2006.  GERD on Protonix. Fatty liver on ultrasound 2006. 50 to 55% EF and grade 1 diastolic dysfx on 75/4492 Echo.  Patent foramen ovale on TEE 04/2015 .  Hx mild CAD.  Type 2 DM on insulin.  Patient may have had previous colonoscopy but there are no records in Epic and both Dr. Collene Mares and the [facility] GI offices have no record of this patient. 10/24 to 05/06/15 [facility] admission for left CVA with right hemiparesis.  TEE determined patent foramen ovale. Dysphagia managed with D1, puree and thin liquids. Discharged to LTAC on new Coumadin, Plavix. She never reached therapeutic INR.  Sent from [facility] today due to painless hematochezia that began this morning. This was also observed in the emergency room where she had large volume blood and clots. She denies abdominal pain. No nausea. Hasn't been eating well as she doesn't like the pured diet. Patient has made slow recovery, if any, from her stroke. She is a total assist for most activities. She is not ambulatory. Expressive aphasia is dense but she is alert..    Hgb is 15.5, was 14.1 on 05/02/15.  PT/INR 13.6/1.0. BUN is 22.    Past Medical History  Diagnosis Date  . CHF (congestive heart failure) (HCC)     EF 01-00%, grade 1 diastolic dysfunction per echo 04/2015  . Diabetes ([place]) 2016    Type II. On insulin  . Hypertension   . CVA (cerebral infarction) 04/2015    Started Plavix 04/2015  . Patent foramen ovale 04/2015    Started on warfarin 04/2015  .  Enchondroma of bone 2011    left femur.     Past Surgical History  Procedure Laterality Date  . Cardiac surgery      Cath without stent  . Tee without cardioversion N/A 05/05/2015    Procedure: TRANSESOPHAGEAL ECHOCARDIOGRAM (TEE);  Surgeon: Dixie Dials, MD;  Location: [facility] ENDOSCOPY;  Service: Cardiovascular;  Laterality: N/A;    Prior to Admission medications   Medication Sig Start Date End Date Taking? Authorizing Provider  amLODipine (NORVASC) 5 MG tablet Take 5 mg by mouth daily.    Historical Provider, MD  atorvastatin (LIPITOR) 40 MG tablet Take 1 tablet (40 mg total) by mouth daily. 05/06/15   Charolette Forward, MD  clopidogrel (PLAVIX) 75 MG tablet Take 1 tablet (75 mg total) by mouth daily. 05/06/15   Charolette Forward, MD  insulin glargine (LANTUS) 100 UNIT/ML injection Inject 0.2 mLs (20 Units total) into the skin at bedtime. 05/06/15   Charolette Forward, MD  metoprolol succinate (TOPROL-XL) 50 MG 24 hr tablet Take 1 tablet (50 mg total) by mouth daily. 05/06/15   Charolette Forward, MD  neomycin-bacitracin-polymyxin (NEOSPORIN) OINT Apply 1 application topically 2 (two) times daily.  05/06/15   Charolette Forward, MD  nitroGLYCERIN (NITROSTAT) 0.4 MG SL tablet Place 0.4 mg under the tongue every 5 (five) minutes as needed for chest pain.    Historical Provider, MD  pantoprazole (PROTONIX) 40 MG tablet Take 1 tablet (40 mg total) by mouth daily at 6 (six) AM. 11/14/12   Charolette Forward, MD  warfarin (COUMADIN) 3 MG tablet Take 1 tablet (3 mg total) by mouth daily. 05/06/15   Charolette Forward, MD    Scheduled Meds:  Infusions:  PRN Meds:    Allergies as of 05/13/2015  . (No Known Allergies)    Family History  Problem Relation Age of Onset  . Hypertension Mother   . Heart failure Mother   . Hyperlipidemia Mother     Social History   Social History  . Marital Status: Married    Spouse Name: N/A  . Number of Children: N/A  . Years of Education: N/A   Occupational History  . Not on file.    Social History Main Topics  . Smoking status: Current Every Day Smoker    Types: Cigarettes  . Smokeless tobacco: Not on file  . Alcohol Use: No  . Drug Use: No  . Sexual Activity: Not on file   Other Topics Concern  . Not on file   Social History Narrative    REVIEW OF SYSTEMS: Constitutional:  Per HPI.   ENT:  No nose bleeds Pulm:  No dyspnea, no cough. CV:  No palpitations, no LE edema.  GU:  No hematuria, no frequency GI:  Dysphagia per HPI.  No nausea, no heartburn. Heme:  No issues with excessive bleeding or bruising.   Transfusions:  No previous transfusions Neuro:  No headaches, no peripheral tingling or numbness Derm:  No itching, no rash or sores.  Endocrine:  No sweats or chills.  No polyuria or dysuria Immunization:  No records of immunizations with in Epic. Travel:  None beyond local counties in last few months.    PHYSICAL EXAM: Vital signs in last 24 hours: Filed Vitals:   05/13/15 1400  BP: 108/71  Pulse: 62  Temp:   Resp: 28   Wt Readings from Last 3 Encounters:  04/28/15 160 lb 11.5 oz (72.9 kg)  11/13/12 163 lb 5.8 oz (74.1 kg)    General: Alert, aphasic AAF who appears her stated age. She is comfortable. She looks somewhat chronically ill. Head:  No facial swelling.  Eyes:  No scleral icterus or conjunctival pallor. Ears:  Hearing appears to be fully intact  Nose:  No discharge or congestion Mouth:  Moist and clear. Neck:  No JVD, no thyromegaly. Lungs:  No labored breathing. No cough. Lungs clear to auscultation bilaterally Heart: RRR. No MRG. S1/S2 audible. Abdomen:  Soft, not tender. Not distended. Bowel sounds active. No masses or HSM. No hernias..   Rectal: deferred.  Large amount of red blood with clots per RN.    Musc/Skeltl: No joint swelling, contracture deformities or erythema. Extremities:  No CCE.  Neurologic:  Appropriate, follows commands. Aphasic and not able to verbalize responses. Dense right hemiparesis. Skin:  No  telangiectasia. Petechial, horizontal rash beneath the breasts in a pattern consistent with a bra strap. However she is not wearing a bra. Tattoos:  None Nodes:  No cervical adenopathy   Psych:  Calm, cooperative.  Intake/Output from previous day:   Intake/Output this shift:    LAB RESULTS:  Recent Labs  05/13/15 1259  WBC 16.7*  HGB 15.5*  HCT 46.4*  PLT 281   BMET Lab Results  Component Value Date   NA 135 05/13/2015   NA 140 05/02/2015   NA 141 04/28/2015   K 4.4 05/13/2015   K 3.7 05/02/2015   K 3.7 04/28/2015   CL 101 05/13/2015   CL 105 05/02/2015   CL 105 04/28/2015   CO2 23 05/13/2015   CO2 23 05/02/2015   CO2 28 04/28/2015   GLUCOSE 115* 05/13/2015   GLUCOSE 222* 05/02/2015   GLUCOSE 221* 04/28/2015   BUN 22* 05/13/2015   BUN 10 05/02/2015   BUN 8 04/28/2015   CREATININE 0.90 05/13/2015   CREATININE 0.96 05/02/2015   CREATININE 0.85 04/28/2015   CALCIUM 9.5 05/13/2015   CALCIUM 9.9 05/02/2015   CALCIUM 9.9 04/28/2015   LFT  Recent Labs  05/13/15 1259  PROT 6.1*  ALBUMIN 3.6  AST 33  ALT 33  ALKPHOS 61  BILITOT 0.6   PT/INR Lab Results  Component Value Date   INR 1.76* 05/13/2015   INR 1.02 05/06/2015   INR 1.00 04/28/2015   Hepatitis Panel No results for input(s): HEPBSAG, HCVAB, HEPAIGM, HEPBIGM in the last 72 hours. C-Diff No components found for: CDIFF Lipase     Component Value Date/Time   LIPASE 30 02/14/2010 1330     RADIOLOGY STUDIES: No results found.  ENDOSCOPIC STUDIES: Nothing found.  No pathology records either  IMPRESSION:   *  Painless hematochezia in pt on Warfarin, plavix, ASA.  Suspect diverticular bleed. Records of previous colonoscopies nor GI imaging that would confirm presence of diverticulosis.  *  Recent CVA.  Expressive aphasia, dense right hemiparesis and total assist for activities of daily living. Nonambulatory. On Plavix  *  Patent foramen ovale. On warfarin. INR non-therapeutic.  *   IDDM.  *  Dysphagia. Esophageal dysmotility dating back to 2006 but more recent 04/2015 neurogenic dysphasia following CVA. On pured/thin liquid diet.  PLAN:     *  Ultimately she will require colonoscopy. In order to prep for this procedure, will require NG tube placed in order to instill laxative prep.  Before undergoing a colonoscopy, Dr. Havery Moros would like neurology to assess patient for approval/clearance for conscious sedation.  We will also need to obtain the consent of the patient's sons, presently they are not here. Tried calling them on numbers listed in her LTAC paperwork. However both phones went to anonymous voicemail so did not leave any messages.  *  Okay to allow pured diet and thin liquids as per SLP recommendations during recent admission.    *  q 12 hour CBC.  Hold plavix.    Azucena Freed  05/13/2015, 2:56 PM Pager: [phone]   Attending Addendum: I have taken an interval history, reviewed the chart, and examined the patient. I agree with the Advanced Practitioner's note and impression. Patient with recent CVA on aspirin, plavix, and coumadin who presents with an episode of painless hematochezia this morning. Initial CBC stable without anemia. Discussed with nursing staff who has been with the patient since her admission, no further bloody stool passed throughout today, she had some blood noted when wiping her however. Differential includes hemorrhoidal, diverticular, mass lesion, etc. Unclear if she has ever had a prior colonoscopy, unable to find old reports. Would appreciate Neurology clearance prior to giving this patient anesthesia in light of very recent CVA. To bowel prep this patient for a colonoscopy, she will need NG tube placement for prep and with her immobility the prep  will be difficult for her. We will follow blood work and reassess her in the morning, await clearance. She is hemodynamically stable at this time. Recommend clear liquid diet in the interim.    [place] Cellar, MD [facility] Pager [phone]

## 2015-05-13 NOTE — ED Notes (Signed)
Pt brought in via [facility]. Pt is at rehab at [facility] following a stroke 8 days ago. Pt had a positive hemoccult today, and was sent to ED for further evaluation. Pt is A/O per baseline. Pt is only able to answer question with yes or no. Pt has right sided deficits from stroke.

## 2015-05-14 DIAGNOSIS — K626 Ulcer of anus and rectum: Secondary | ICD-10-CM | POA: Diagnosis not present

## 2015-05-14 DIAGNOSIS — E119 Type 2 diabetes mellitus without complications: Secondary | ICD-10-CM | POA: Diagnosis not present

## 2015-05-14 DIAGNOSIS — I639 Cerebral infarction, unspecified: Secondary | ICD-10-CM | POA: Diagnosis not present

## 2015-05-14 DIAGNOSIS — K922 Gastrointestinal hemorrhage, unspecified: Secondary | ICD-10-CM | POA: Diagnosis not present

## 2015-05-14 DIAGNOSIS — K625 Hemorrhage of anus and rectum: Secondary | ICD-10-CM | POA: Diagnosis not present

## 2015-05-14 DIAGNOSIS — F17290 Nicotine dependence, other tobacco product, uncomplicated: Secondary | ICD-10-CM | POA: Diagnosis not present

## 2015-05-14 DIAGNOSIS — Z8673 Personal history of transient ischemic attack (TIA), and cerebral infarction without residual deficits: Secondary | ICD-10-CM | POA: Diagnosis not present

## 2015-05-14 DIAGNOSIS — R579 Shock, unspecified: Secondary | ICD-10-CM | POA: Diagnosis not present

## 2015-05-14 DIAGNOSIS — I119 Hypertensive heart disease without heart failure: Secondary | ICD-10-CM | POA: Diagnosis not present

## 2015-05-14 DIAGNOSIS — I2510 Atherosclerotic heart disease of native coronary artery without angina pectoris: Secondary | ICD-10-CM | POA: Diagnosis not present

## 2015-05-14 LAB — BASIC METABOLIC PANEL
ANION GAP: 10 (ref 5–15)
BUN: 23 mg/dL — ABNORMAL HIGH (ref 6–20)
CHLORIDE: 105 mmol/L (ref 101–111)
CO2: 23 mmol/L (ref 22–32)
Calcium: 9.2 mg/dL (ref 8.9–10.3)
Creatinine, Ser: 0.78 mg/dL (ref 0.44–1.00)
GFR calc non Af Amer: >60 mL/min (ref >60)
Glucose, Bld: 131 mg/dL — ABNORMAL HIGH (ref 65–99)
POTASSIUM: 4 mmol/L (ref 3.5–5.1)
Sodium: 138 mmol/L (ref 135–145)

## 2015-05-14 LAB — GLUCOSE, CAPILLARY
GLUCOSE-CAPILLARY: 135 mg/dL — AB (ref 65–99)
Glucose-Capillary: 182 mg/dL — ABNORMAL HIGH (ref 65–99)
Glucose-Capillary: 221 mg/dL — ABNORMAL HIGH (ref 65–99)
Glucose-Capillary: 166 mg/dL — ABNORMAL HIGH (ref 65–99)

## 2015-05-14 LAB — CBC
HEMATOCRIT: 43.2 % (ref 36.0–46.0)
HEMOGLOBIN: 14.1 g/dL (ref 12.0–15.0)
MCH: 28.1 pg (ref 26.0–34.0)
MCHC: 32.6 g/dL (ref 30.0–36.0)
MCV: 86.1 fL (ref 78.0–100.0)
Platelets: 283 10*3/uL (ref 150–400)
RBC: 5.02 MIL/uL (ref 3.87–5.11)
RDW: 13 % (ref 11.5–15.5)
WBC: 14.3 10*3/uL — ABNORMAL HIGH (ref 4.0–10.5)

## 2015-05-14 LAB — ABO/RH: ABO/RH(D): A POS

## 2015-05-14 MED ORDER — POLYETHYLENE GLYCOL 3350 17 GM/SCOOP PO POWD
1.0000 | Freq: Once | ORAL | Status: AC
  Administered 2015-05-14: 255 g via ORAL
  Filled 2015-05-14 (×2): qty 255

## 2015-05-14 NOTE — Progress Notes (Signed)
Spoke with Dr. Terrence Dupont who is patient's PCP.  Dr. Terrence Dupont has graciously agreed to accept care of this patient.  Will change attending to Dr. Terrence Dupont  DTat

## 2015-05-14 NOTE — Clinical Social Work Note (Signed)
Clinical Social Work Assessment  Patient Details  Name: [name] MRN: [id number] Date of Birth: [birth year]  Date of referral:  05/14/15               Reason for consult:  Facility Placement                Permission sought to share information with:  Facility Sport and exercise psychologist, Family Supports Permission granted to share information::  Yes, Verbal Permission Granted  Name::     Engineer, civil (consulting)::  [facility]  Relationship::  son  Contact Information:     Housing/Transportation Living arrangements for the past 2 months:  [place] of Information:  Patient, Adult Children, Case Manager Patient Interpreter Needed:  None Criminal Activity/Legal Involvement Pertinent to Current Situation/Hospitalization:  No - Comment as needed Significant Relationships:  Adult Children, Spouse Lives with:  Spouse Do you feel safe going back to the place where you live?  Yes Need for family participation in patient care:  Yes (Comment) (needs care assistance)  Care giving concerns:  None- pt plans to return to SNF   Social Worker assessment / plan:  CSW/ RNCM spoke with pt and pt family concerning plan for pt to return to rehab at time of DC.  Employment status:  Retired Health visitor, Managed Care PT Recommendations:  Not assessed at this time Information / Referral to community resources:  [place]  Patient/Family's Response to care:  Pt and family are agreeable to pt return to rehab when [place] from the hospital.  Patient/Family's Understanding of and Emotional Response to Diagnosis, Current Treatment, and Prognosis:  No questions or concerns about prognosis/treatment at this time.  Emotional Assessment Appearance:  Appears stated age Attitude/Demeanor/Rapport:  Sedated Affect (typically observed):  Appropriate, Quiet Orientation:  Oriented to Self, Oriented to Place, Oriented to  Time, Oriented to Situation Alcohol / Substance use:   Not Applicable Psych involvement (Current and /or in the community):  No (Comment)  Discharge Needs  Concerns to be addressed:  Care Coordination Readmission within the last 30 days:  Yes Current discharge risk:  None Barriers to Discharge:  Continued Medical Work up   [employer], LCSW 05/14/2015, 3:09 PM

## 2015-05-14 NOTE — NC FL2 (Signed)
[place] LEVEL OF CARE SCREENING TOOL     IDENTIFICATION  Patient Name: [name] Birthdate: [birth year] Sex: female Admission Date (Current Location): 05/13/2015  [facility] and [place] Number: Herbalist and Address:  [facility] [place], [place] [address], [place], [place] [address]      Provider Number: [id number]  Attending Physician Name and Address:  Charolette Forward, MD  Relative Name and Phone Number:       Current Level of Care: Hospital Recommended Level of Care: [place] Prior Approval Number:    Date Approved/Denied:   PASRR Number: [id number]  Discharge Plan: SNF    Current Diagnoses: Patient Active Problem List   Diagnosis Date Noted  . GI bleed 05/13/2015  . BRBPR (bright red blood per rectum) 05/13/2015  . CHF (congestive heart failure) ([place])   . Diabetes ([place])   . Hypertension   . History of stroke   . Lower GI bleed   . Left-sided cerebrovascular accident (CVA) ([place]) 04/28/2015    Orientation ACTIVITIES/SOCIAL BLADDER RESPIRATION    Self, Time, Situation, Place    Incontinent Normal  BEHAVIORAL SYMPTOMS/MOOD NEUROLOGICAL BOWEL NUTRITION STATUS      Incontinent Diet  PHYSICIAN VISITS COMMUNICATION OF NEEDS Height & Weight Skin    Verbally   160 lbs. Normal          AMBULATORY STATUS RESPIRATION    Assist extensive Normal      Personal Care Assistance Level of Assistance  Bathing, Feeding, Dressing Bathing Assistance: Limited assistance Feeding assistance: Limited assistance Dressing Assistance: Limited assistance      Functional Limitations Info  Speech     Speech Info: Impaired       SPECIAL CARE FACTORS FREQUENCY  PT (By licensed PT)     PT Frequency: 5/wk             Additional Factors Info  Insulin Sliding Scale, Code Status Code Status Info: Full     Insulin Sliding Scale Info: 4x day       Current Medications (05/14/2015): Current  Facility-Administered Medications  Medication Dose Route Frequency Provider Last Rate Last Dose  . acetaminophen (TYLENOL) tablet 650 mg  650 mg Oral Q6H PRN Radene Gunning, NP       Or  . acetaminophen (TYLENOL) suppository 650 mg  650 mg Rectal Q6H PRN Radene Gunning, NP      . alum & mag hydroxide-simeth (MAALOX/MYLANTA) 200-200-20 MG/5ML suspension 30 mL  30 mL Oral Q6H PRN Radene Gunning, NP      . atorvastatin (LIPITOR) tablet 40 mg  40 mg Oral Daily Lezlie Octave Black, NP   40 mg at 05/14/15 0900  . insulin aspart (novoLOG) injection 0-15 Units  0-15 Units Subcutaneous TID WC Radene Gunning, NP   3 Units at 05/14/15 1314  . insulin aspart (novoLOG) injection 0-5 Units  0-5 Units Subcutaneous QHS Radene Gunning, NP   3 Units at 05/13/15 2211  . insulin glargine (LANTUS) injection 10 Units  10 Units Subcutaneous QHS Radene Gunning, NP   10 Units at 05/13/15 2212  . ondansetron (ZOFRAN) tablet 4 mg  4 mg Oral Q6H PRN Radene Gunning, NP       Or  . ondansetron [facility]) injection 4 mg  4 mg Intravenous Q6H PRN Radene Gunning, NP      . pantoprazole (PROTONIX) EC tablet 40 mg  40 mg Oral Q0600 Lezlie Octave [place],  NP   40 mg at 05/14/15 0522  . polyethylene glycol powder (GLYCOLAX/MIRALAX) container 255 g  1 Container Oral Once Vena Rua, PA-C       Do not use this list as official medication orders. Please verify with discharge summary.  Discharge Medications:   Medication List    ASK your doctor about these medications        acetaminophen 325 MG tablet  Commonly known as:  TYLENOL  Take 650 mg by mouth every 6 (six) hours as needed for mild pain.     amLODipine 5 MG tablet  Commonly known as:  NORVASC  Take 5 mg by mouth daily.     atorvastatin 40 MG tablet  Commonly known as:  LIPITOR  Take 1 tablet (40 mg total) by mouth daily.     clopidogrel 75 MG tablet  Commonly known as:  PLAVIX  Take 1 tablet (75 mg total) by mouth daily.     insulin glargine 100 UNIT/ML injection  Commonly  known as:  LANTUS  Inject 0.2 mLs (20 Units total) into the skin at bedtime.     insulin regular 100 units/mL injection  Commonly known as:  NOVOLIN R,HUMULIN R  Inject 0-10 Units into the skin See admin instructions. Per sliding scale: BS 0-69 give 0 units, BS 70-150 give 0 units, BS 151-200 give 2 units, BS 201-250 give 4 units, BS 251-300 give 6 units, BS 301-350 give 8 units, BS 351-400 give 10 units, BS 401 or higher, call MD     metoprolol succinate 50 MG 24 hr tablet  Commonly known as:  TOPROL-XL  Take 1 tablet (50 mg total) by mouth daily.     miconazole 2 % cream  Commonly known as:  MICOTIN  Apply 1 application topically 2 (two) times daily.     nitroGLYCERIN 0.4 MG SL tablet  Commonly known as:  NITROSTAT  Place 0.4 mg under the tongue every 5 (five) minutes as needed for chest pain.     pantoprazole 40 MG tablet  Commonly known as:  PROTONIX  Take 1 tablet (40 mg total) by mouth daily at 6 (six) AM.     warfarin 3 MG tablet  Commonly known as:  COUMADIN  Take 1 tablet (3 mg total) by mouth daily.        Relevant Imaging Results:  Relevant Lab Results:  Recent Labs    Additional Information    [place]ranford Mon, LCSW

## 2015-05-14 NOTE — Progress Notes (Signed)
Progress Note   Subjective  Last 24 hours - patient continues to have blood per rectum per nursing staff. A moderate amount of right red blood noted when changing her on multiple occasions. Hemodynamically stable, Hgb stable. She denies abdominal pain. Spoke with son at length this morning about her situation. He is not aware that she has had a prior colonoscopy, the patient thinks she may have but is not sure.    Objective   Vital signs in last 24 hours: Temp:  [97.6 F (36.4 C)-98.5 F (36.9 C)] 97.6 F (36.4 C) (11/09 0523) Pulse Rate:  [46-74] 63 (11/09 0523) Resp:  [15-29] 19 (11/09 0523) BP: (95-121)/(62-98) 104/69 mmHg (11/09 0523) SpO2:  [77 %-100 %] 100 % (11/09 0523) Weight:  [160 lb 15 oz (73 kg)] 160 lb 15 oz (73 kg) (11/09 0523)   General:    AA female in NAD Heart:  Regular rate and rhythm; no murmurs Lungs: Respirations even and unlabored, lungs CTA bilaterally Abdomen:  Soft, nontender and nondistended. Normal bowel sounds. Extremities:  Without edema. Neurologic:  Alert and oriented, can respond yes or no to questioning, facial droop noted, R sided hemiparesis Psych:  Cooperative. Normal mood and affect.  Intake/Output from previous day:   Intake/Output this shift:    Lab Results:  Recent Labs  05/13/15 1259 05/13/15 2213 05/14/15 0708  WBC 16.7* 15.0* 14.3*  HGB 15.5* 14.3 14.1  HCT 46.4* 43.7 43.2  PLT 281 262 283   BMET  Recent Labs  05/13/15 1259  NA 135  K 4.4  CL 101  CO2 23  GLUCOSE 115*  BUN 22*  CREATININE 0.90  CALCIUM 9.5   LFT  Recent Labs  05/13/15 1259  PROT 6.1*  ALBUMIN 3.6  AST 33  ALT 33  ALKPHOS 61  BILITOT 0.6   PT/INR  Recent Labs  05/13/15 1259  LABPROT 20.5*  INR 1.76*    Studies/Results: No results found.     Assessment / Plan:   69 y/o female with history of CHF and recent CVA within the past 2 weeks, on aspirin / plavix / coumadin as outpatient who was admitted for bright red blood  per rectum. Reportedly had a large volume bowel movement with red blood and clots. She has not passed further stool but in discussion with nursing staff has continued to pass red blood with blood clots per rectum. Her hemoglobin has remained relatively stable and she is not anemic.   I spoke with the patient's son [name] at length this morning (cell [phone]). He is not sure if she has ever had a prior colonoscopy. She has a need for long term antiplatelet therapy given her CVA, unclear if coumadin will be continued as an outpatient. Given her recent CVA I have asked Neurology to weigh in on her clearance for colonoscopy, who reports she is cleared to do so and avoid hypotension if at all possible.   While her bleeding could be hemorrhoidal in etiology, other possibilities include bleeding mass (unclear if she has had prior screening), diverticular bleed, AVM, etc. I discussed colonoscopy to evaluate her ongoing rectal bleeding and the risks of sedation and endoscopy following her recent CVA with the patient and son this morning. The other option is to monitor her without evaluation which would avoid the risks of sedation, or perform a limited flex sig without sedation. He is concerned about her ongoing bleeding and need for antiplatelet therapy and wanted to proceed  with colonoscopy. Patient was in agreement after this discussion. He reports she has been able to drink liquids without problems and was hoping the patient could drink the prep without using NG tube. Will start bowel preparation today if she can tolerate it, she can have clear liquids with this, and hopefully be prepared for colonoscopy tentatively tomorrow (staff would be Dr. Silverio Decamp). I will discuss this case with Dr. Silverio Decamp. We will coordinate timing of preparation. All questions answered.   [place] Cellar, MD [facility] Pager [phone]    Principal Problem:   BRBPR (bright red blood per rectum) Active  Problems:   Left-sided cerebrovascular accident (CVA) ([place])   GI bleed   CHF (congestive heart failure) ([place])   Diabetes ([place])   Hypertension   History of stroke   Lower GI bleed     LOS: 1 day   [name]  05/14/2015, 8:01 AM

## 2015-05-14 NOTE — Progress Notes (Addendum)
Nutrition Brief Note  Patient identified on the [place] Report  Wt Readings from Last 15 Encounters:  05/14/15 160 lb 15 oz (73 kg)  04/28/15 160 lb 11.5 oz (72.9 kg)  11/13/12 163 lb 5.8 oz (74.1 kg)   [name] is a 69 y.o. female with a past medical history that includes CAD, diabetes, left CVA with right hemiparesis 7 days ago [facility] with the chief complaint of bright red blood per rectum. Initial evaluation in the emergency department reveals positive Hemoccult and patient incontinent large amounts of bright red blood with clots during emergency department stay.  Pt admitted with BRBPR. GI following; plan is for pt to undergo colonoscopy tomorrow. She is currently on a clear liquid diet; she consumed about 50% of her lunch tray.   Wt hx reviewed; wt has been stable over the past 2 years.  Body mass index is 25.2 kg/(m^2). Patient meets criteria for overweight based on current BMI.   Current diet order is clear liquid, patient is consuming approximately 50% of meals at this time. Labs and medications reviewed.   No nutrition interventions warranted at this time. If nutrition issues arise, please consult RD.   Ronaldo Crilly A. Jimmye Norman, RD, LDN, CDE Pager: [phone] After hours Pager: [phone]

## 2015-05-14 NOTE — Progress Notes (Signed)
Subjective:  Awake in no acute distress. Scheduled for colonoscopy in a.m.  Objective:  Vital Signs in the last 24 hours: Temp:  [97.6 F (36.4 C)-98.5 F (36.9 C)] 97.6 F (36.4 C) (11/09 0523) Pulse Rate:  [46-63] 63 (11/09 0523) Resp:  [18-19] 19 (11/09 0523) BP: (95-104)/(62-69) 104/69 mmHg (11/09 0523) SpO2:  [98 %-100 %] 100 % (11/09 0523) Weight:  [73 kg (160 lb 15 oz)] 73 kg (160 lb 15 oz) (11/09 0523)  Intake/Output from previous day:   Intake/Output from this shift: Total I/O In: 360 [P.O.:360] Out: -   Physical Exam: Neck: no adenopathy, no carotid bruit, no JVD and supple, symmetrical, trachea midline Lungs: clear to auscultation bilaterally Heart: regular rate and rhythm, S1, S2 normal and Soft systolic murmur noted Abdomen: soft, non-tender; bowel sounds normal; no masses,  no organomegaly Extremities: extremities normal, atraumatic, no cyanosis or edema Neuro left-sided paresis noted Lab Results:  Recent Labs  05/13/15 2213 05/14/15 0708  WBC 15.0* 14.3*  HGB 14.3 14.1  PLT 262 283    Recent Labs  05/13/15 1259 05/14/15 0708  NA 135 138  K 4.4 4.0  CL 101 105  CO2 23 23  GLUCOSE 115* 131*  BUN 22* 23*  CREATININE 0.90 0.78   No results for input(s): TROPONINI in the last 72 hours.  Invalid input(s): CK, MB Hepatic Function Panel  Recent Labs  05/13/15 1259  PROT 6.1*  ALBUMIN 3.6  AST 33  ALT 33  ALKPHOS 61  BILITOT 0.6   No results for input(s): CHOL in the last 72 hours. No results for input(s): PROTIME in the last 72 hours.  Imaging: Imaging results have been reviewed and No results found.  Cardiac Studies:  Assessment/Plan:  Acute lower GI bleeding workup in progress Evolving recent left CVA with right sided paresis Mild CAD in the past Hypertension Diabetes mellitus Compensated congestive heart failure secondary to preserve LV systolic function PFO History of tobacco abuse in the past Hyperlipidemia Plan Check  labs in a.m. Scheduled for colonoscopy in a.m. OT PT consult  LOS: 1 day    Charolette Forward 05/14/2015, 6:21 PM

## 2015-05-14 NOTE — Care Management Obs Status (Signed)
[place] NOTIFICATION   Patient Details  Name: [name] MRN: [id number] Date of Birth: 21-Jan-1946   Medicare Observation Status Notification Given:  Yes    Zenon Mayo, RN 05/14/2015, 11:37 AM

## 2015-05-14 NOTE — Care Management Note (Signed)
Case Management Note  Patient Details  Name: [name] MRN: [id number] Date of Birth: 07-24-1945  Subjective/Objective:    Date: 05/14/15 Spoke with patient at the bedside along with son [name]. Introduced self as Tourist information centre manager and explained role in discharge planning and how to be reached.  Verified patient lives in town,  with spouse, but after she leaves [facility] she will be going home with son.  Expressed potential need for no other DME. Verified patient anticipates to go back to [facility] at time of discharge and will have full-time supervision. Son  denied needing help with their medication.  Patient is driven by son to MD appointments.  Verified patient has PCP Harwani.   Plan: CM will continue to follow for discharge planning and [facility] resources.                 Action/Plan:   Expected Discharge Date:                  Expected Discharge Plan:  Skilled Nursing Facility  In-House Referral:  Clinical Social Work  Discharge planning Services  CM Consult  Post Acute Care Choice:    Choice offered to:     DME Arranged:    DME Agency:     HH Arranged:    [place] Agency:     Status of Service:  Completed, signed off  Medicare Important Message Given:    Date Medicare IM Given:    Medicare IM give by:    Date Additional Medicare IM Given:    Additional Medicare Important Message give by:     If discussed at [place] of Stay Meetings, dates discussed:    Additional Comments:  Zenon Mayo, RN 05/14/2015, 11:49 AM

## 2015-05-15 ENCOUNTER — Encounter (HOSPITAL_COMMUNITY): Payer: Self-pay | Admitting: Certified Registered Nurse Anesthetist

## 2015-05-15 ENCOUNTER — Encounter (HOSPITAL_COMMUNITY)
Admission: EM | Disposition: A | Discharge status: Skilled Nursing Facility | Payer: Self-pay | Source: Home / Self Care | Attending: Cardiology

## 2015-05-15 ENCOUNTER — Observation Stay (HOSPITAL_COMMUNITY): Payer: Medicare Other | Admitting: Certified Registered Nurse Anesthetist

## 2015-05-15 ENCOUNTER — Observation Stay (HOSPITAL_COMMUNITY): Payer: Medicare Other

## 2015-05-15 DIAGNOSIS — R4701 Aphasia: Secondary | ICD-10-CM | POA: Diagnosis not present

## 2015-05-15 DIAGNOSIS — N390 Urinary tract infection, site not specified: Secondary | ICD-10-CM | POA: Diagnosis present

## 2015-05-15 DIAGNOSIS — K622 Anal prolapse: Secondary | ICD-10-CM | POA: Diagnosis not present

## 2015-05-15 DIAGNOSIS — J9600 Acute respiratory failure, unspecified whether with hypoxia or hypercapnia: Secondary | ICD-10-CM | POA: Diagnosis not present

## 2015-05-15 DIAGNOSIS — I69351 Hemiplegia and hemiparesis following cerebral infarction affecting right dominant side: Secondary | ICD-10-CM | POA: Diagnosis not present

## 2015-05-15 DIAGNOSIS — Z8249 Family history of ischemic heart disease and other diseases of the circulatory system: Secondary | ICD-10-CM | POA: Diagnosis not present

## 2015-05-15 DIAGNOSIS — D509 Iron deficiency anemia, unspecified: Secondary | ICD-10-CM | POA: Diagnosis not present

## 2015-05-15 DIAGNOSIS — I2510 Atherosclerotic heart disease of native coronary artery without angina pectoris: Secondary | ICD-10-CM | POA: Diagnosis present

## 2015-05-15 DIAGNOSIS — K625 Hemorrhage of anus and rectum: Secondary | ICD-10-CM | POA: Diagnosis not present

## 2015-05-15 DIAGNOSIS — K219 Gastro-esophageal reflux disease without esophagitis: Secondary | ICD-10-CM | POA: Diagnosis present

## 2015-05-15 DIAGNOSIS — K922 Gastrointestinal hemorrhage, unspecified: Secondary | ICD-10-CM | POA: Diagnosis present

## 2015-05-15 DIAGNOSIS — R579 Shock, unspecified: Secondary | ICD-10-CM | POA: Diagnosis not present

## 2015-05-15 DIAGNOSIS — R571 Hypovolemic shock: Secondary | ICD-10-CM | POA: Diagnosis not present

## 2015-05-15 DIAGNOSIS — R262 Difficulty in walking, not elsewhere classified: Secondary | ICD-10-CM | POA: Diagnosis not present

## 2015-05-15 DIAGNOSIS — F17290 Nicotine dependence, other tobacco product, uncomplicated: Secondary | ICD-10-CM | POA: Diagnosis not present

## 2015-05-15 DIAGNOSIS — E119 Type 2 diabetes mellitus without complications: Secondary | ICD-10-CM | POA: Diagnosis present

## 2015-05-15 DIAGNOSIS — R918 Other nonspecific abnormal finding of lung field: Secondary | ICD-10-CM | POA: Diagnosis not present

## 2015-05-15 DIAGNOSIS — Z7901 Long term (current) use of anticoagulants: Secondary | ICD-10-CM | POA: Diagnosis not present

## 2015-05-15 DIAGNOSIS — B9620 Unspecified Escherichia coli [E. coli] as the cause of diseases classified elsewhere: Secondary | ICD-10-CM | POA: Diagnosis present

## 2015-05-15 DIAGNOSIS — I10 Essential (primary) hypertension: Secondary | ICD-10-CM | POA: Diagnosis not present

## 2015-05-15 DIAGNOSIS — I110 Hypertensive heart disease with heart failure: Secondary | ICD-10-CM | POA: Diagnosis present

## 2015-05-15 DIAGNOSIS — I69320 Aphasia following cerebral infarction: Secondary | ICD-10-CM | POA: Diagnosis not present

## 2015-05-15 DIAGNOSIS — Z7902 Long term (current) use of antithrombotics/antiplatelets: Secondary | ICD-10-CM | POA: Diagnosis not present

## 2015-05-15 DIAGNOSIS — K51211 Ulcerative (chronic) proctitis with rectal bleeding: Secondary | ICD-10-CM | POA: Diagnosis not present

## 2015-05-15 DIAGNOSIS — D62 Acute posthemorrhagic anemia: Secondary | ICD-10-CM | POA: Diagnosis present

## 2015-05-15 DIAGNOSIS — F17210 Nicotine dependence, cigarettes, uncomplicated: Secondary | ICD-10-CM | POA: Diagnosis present

## 2015-05-15 DIAGNOSIS — K224 Dyskinesia of esophagus: Secondary | ICD-10-CM | POA: Diagnosis present

## 2015-05-15 DIAGNOSIS — Z794 Long term (current) use of insulin: Secondary | ICD-10-CM | POA: Diagnosis not present

## 2015-05-15 DIAGNOSIS — K626 Ulcer of anus and rectum: Secondary | ICD-10-CM | POA: Diagnosis not present

## 2015-05-15 DIAGNOSIS — I119 Hypertensive heart disease without heart failure: Secondary | ICD-10-CM | POA: Diagnosis not present

## 2015-05-15 DIAGNOSIS — R1312 Dysphagia, oropharyngeal phase: Secondary | ICD-10-CM | POA: Diagnosis not present

## 2015-05-15 DIAGNOSIS — K921 Melena: Secondary | ICD-10-CM | POA: Diagnosis present

## 2015-05-15 DIAGNOSIS — R1319 Other dysphagia: Secondary | ICD-10-CM | POA: Diagnosis present

## 2015-05-15 DIAGNOSIS — I63422 Cerebral infarction due to embolism of left anterior cerebral artery: Secondary | ICD-10-CM | POA: Diagnosis not present

## 2015-05-15 DIAGNOSIS — I429 Cardiomyopathy, unspecified: Secondary | ICD-10-CM | POA: Diagnosis present

## 2015-05-15 DIAGNOSIS — Z7401 Bed confinement status: Secondary | ICD-10-CM | POA: Diagnosis not present

## 2015-05-15 DIAGNOSIS — I5032 Chronic diastolic (congestive) heart failure: Secondary | ICD-10-CM | POA: Diagnosis present

## 2015-05-15 DIAGNOSIS — I5040 Unspecified combined systolic (congestive) and diastolic (congestive) heart failure: Secondary | ICD-10-CM | POA: Diagnosis not present

## 2015-05-15 DIAGNOSIS — E785 Hyperlipidemia, unspecified: Secondary | ICD-10-CM | POA: Diagnosis present

## 2015-05-15 DIAGNOSIS — I639 Cerebral infarction, unspecified: Secondary | ICD-10-CM | POA: Diagnosis not present

## 2015-05-15 DIAGNOSIS — K760 Fatty (change of) liver, not elsewhere classified: Secondary | ICD-10-CM | POA: Diagnosis present

## 2015-05-15 DIAGNOSIS — K621 Rectal polyp: Secondary | ICD-10-CM | POA: Diagnosis present

## 2015-05-15 DIAGNOSIS — Z993 Dependence on wheelchair: Secondary | ICD-10-CM | POA: Diagnosis not present

## 2015-05-15 DIAGNOSIS — M6281 Muscle weakness (generalized): Secondary | ICD-10-CM | POA: Diagnosis not present

## 2015-05-15 DIAGNOSIS — I69391 Dysphagia following cerebral infarction: Secondary | ICD-10-CM | POA: Diagnosis not present

## 2015-05-15 DIAGNOSIS — Q211 Atrial septal defect: Secondary | ICD-10-CM | POA: Diagnosis not present

## 2015-05-15 HISTORY — PX: COLONOSCOPY: SHX5424

## 2015-05-15 HISTORY — PX: FLEXIBLE SIGMOIDOSCOPY: SHX5431

## 2015-05-15 LAB — CBC
HEMATOCRIT: 39.1 % (ref 36.0–46.0)
HEMATOCRIT: 30.3 % — AB (ref 36.0–46.0)
HEMATOCRIT: 29.1 % — AB (ref 36.0–46.0)
HEMOGLOBIN: 12.7 g/dL (ref 12.0–15.0)
HEMOGLOBIN: 9.8 g/dL — AB (ref 12.0–15.0)
Hemoglobin: 9.5 g/dL — ABNORMAL LOW (ref 12.0–15.0)
MCH: 28.2 pg (ref 26.0–34.0)
MCH: 28.3 pg (ref 26.0–34.0)
MCH: 28.5 pg (ref 26.0–34.0)
MCHC: 32.5 g/dL (ref 30.0–36.0)
MCHC: 32.3 g/dL (ref 30.0–36.0)
MCHC: 32.6 g/dL (ref 30.0–36.0)
MCV: 86.7 fL (ref 78.0–100.0)
MCV: 87.6 fL (ref 78.0–100.0)
MCV: 87.4 fL (ref 78.0–100.0)
PLATELETS: 214 10*3/uL (ref 150–400)
Platelets: 242 10*3/uL (ref 150–400)
Platelets: 251 10*3/uL (ref 150–400)
RBC: 4.51 MIL/uL (ref 3.87–5.11)
RBC: 3.46 MIL/uL — AB (ref 3.87–5.11)
RBC: 3.33 MIL/uL — AB (ref 3.87–5.11)
RDW: 13 % (ref 11.5–15.5)
RDW: 13 % (ref 11.5–15.5)
RDW: 12.9 % (ref 11.5–15.5)
WBC: 17.4 10*3/uL — ABNORMAL HIGH (ref 4.0–10.5)
WBC: 16.5 10*3/uL — AB (ref 4.0–10.5)
WBC: 14.2 10*3/uL — AB (ref 4.0–10.5)

## 2015-05-15 LAB — BLOOD GAS, ARTERIAL
ACID-BASE DEFICIT: 5.8 mmol/L — AB (ref 0.0–2.0)
Bicarbonate: 18.2 mEq/L — ABNORMAL LOW (ref 20.0–24.0)
Drawn by: 246101
FIO2: 1
O2 SAT: 99.8 %
PO2 ART: 442 mmHg — AB (ref 80.0–100.0)
Patient temperature: 98.6
TCO2: 19.1 mmol/L (ref 0–100)
pCO2 arterial: 30.1 mmHg — ABNORMAL LOW (ref 35.0–45.0)
pH, Arterial: 7.398 (ref 7.350–7.450)

## 2015-05-15 LAB — URINE MICROSCOPIC-ADD ON

## 2015-05-15 LAB — URINALYSIS, ROUTINE W REFLEX MICROSCOPIC
Bilirubin Urine: NEGATIVE
GLUCOSE, UA: 500 mg/dL — AB
Hgb urine dipstick: NEGATIVE
KETONES UR: NEGATIVE mg/dL
NITRITE: NEGATIVE
PH: 6 (ref 5.0–8.0)
PROTEIN: NEGATIVE mg/dL
Specific Gravity, Urine: 1.015 (ref 1.005–1.030)
Urobilinogen, UA: 0.2 mg/dL (ref 0.0–1.0)

## 2015-05-15 LAB — GLUCOSE, CAPILLARY
GLUCOSE-CAPILLARY: 166 mg/dL — AB (ref 65–99)
GLUCOSE-CAPILLARY: 178 mg/dL — AB (ref 65–99)
GLUCOSE-CAPILLARY: 225 mg/dL — AB (ref 65–99)
GLUCOSE-CAPILLARY: 174 mg/dL — AB (ref 65–99)
Glucose-Capillary: 192 mg/dL — ABNORMAL HIGH (ref 65–99)
Glucose-Capillary: 118 mg/dL — ABNORMAL HIGH (ref 65–99)

## 2015-05-15 LAB — MRSA PCR SCREENING: MRSA by PCR: NEGATIVE

## 2015-05-15 LAB — BASIC METABOLIC PANEL
ANION GAP: 8 (ref 5–15)
ANION GAP: 9 (ref 5–15)
Anion gap: 5 (ref 5–15)
BUN: 16 mg/dL (ref 6–20)
BUN: 13 mg/dL (ref 6–20)
BUN: 11 mg/dL (ref 6–20)
CHLORIDE: 108 mmol/L (ref 101–111)
CO2: 26 mmol/L (ref 22–32)
CO2: 21 mmol/L — AB (ref 22–32)
CO2: 24 mmol/L (ref 22–32)
Calcium: 8.9 mg/dL (ref 8.9–10.3)
Calcium: 8 mg/dL — ABNORMAL LOW (ref 8.9–10.3)
Calcium: 7.8 mg/dL — ABNORMAL LOW (ref 8.9–10.3)
Chloride: 100 mmol/L — ABNORMAL LOW (ref 101–111)
Chloride: 109 mmol/L (ref 101–111)
Creatinine, Ser: 1.06 mg/dL — ABNORMAL HIGH (ref 0.44–1.00)
Creatinine, Ser: 0.88 mg/dL (ref 0.44–1.00)
Creatinine, Ser: 0.91 mg/dL (ref 0.44–1.00)
GFR calc Af Amer: >60 mL/min (ref >60)
GFR calc non Af Amer: >60 mL/min (ref >60)
GFR, EST NON AFRICAN AMERICAN: 52 mL/min — AB (ref >60)
GLUCOSE: 246 mg/dL — AB (ref 65–99)
Glucose, Bld: 212 mg/dL — ABNORMAL HIGH (ref 65–99)
Glucose, Bld: 216 mg/dL — ABNORMAL HIGH (ref 65–99)
POTASSIUM: 3.6 mmol/L (ref 3.5–5.1)
POTASSIUM: 3.7 mmol/L (ref 3.5–5.1)
POTASSIUM: 4.1 mmol/L (ref 3.5–5.1)
SODIUM: 138 mmol/L (ref 135–145)
SODIUM: 138 mmol/L (ref 135–145)
Sodium: 134 mmol/L — ABNORMAL LOW (ref 135–145)

## 2015-05-15 LAB — LACTIC ACID, PLASMA
LACTIC ACID, VENOUS: 1.9 mmol/L (ref 0.5–2.0)
Lactic Acid, Venous: 2.2 mmol/L (ref 0.5–2.0)

## 2015-05-15 LAB — TROPONIN I

## 2015-05-15 LAB — HEMOGLOBIN AND HEMATOCRIT, BLOOD
HEMATOCRIT: 29.9 % — AB (ref 36.0–46.0)
HEMOGLOBIN: 9.8 g/dL — AB (ref 12.0–15.0)

## 2015-05-15 LAB — PROCALCITONIN: Procalcitonin: <0.1 ng/mL

## 2015-05-15 SURGERY — COLONOSCOPY
Anesthesia: Monitor Anesthesia Care

## 2015-05-15 SURGERY — SIGMOIDOSCOPY, FLEXIBLE
Anesthesia: Moderate Sedation

## 2015-05-15 MED ORDER — MIDAZOLAM HCL 5 MG/ML IJ SOLN
INTRAMUSCULAR | Status: AC
  Filled 2015-05-15: qty 2

## 2015-05-15 MED ORDER — PROPOFOL 500 MG/50ML IV EMUL
INTRAVENOUS | Status: DC | PRN
  Administered 2015-05-15: 75 ug/kg/min via INTRAVENOUS

## 2015-05-15 MED ORDER — PHENYLEPHRINE HCL 10 MG/ML IJ SOLN
10.0000 mg | INTRAMUSCULAR | Status: DC | PRN
  Administered 2015-05-15: 100 ug/min via INTRAVENOUS

## 2015-05-15 MED ORDER — DEXTROSE 5 % IV SOLN
1.0000 g | INTRAVENOUS | Status: DC
  Administered 2015-05-15 – 2015-05-18 (×4): 1 g via INTRAVENOUS
  Filled 2015-05-15 (×6): qty 10

## 2015-05-15 MED ORDER — MESALAMINE 1000 MG RE SUPP
1000.0000 mg | Freq: Every day | RECTAL | Status: DC
  Administered 2015-05-17 – 2015-05-19 (×4): 1000 mg via RECTAL
  Filled 2015-05-15 (×7): qty 1

## 2015-05-15 MED ORDER — SODIUM CHLORIDE 0.9 % IV SOLN
INTRAVENOUS | Status: DC | PRN
  Administered 2015-05-15: 10:00:00 via INTRAVENOUS

## 2015-05-15 MED ORDER — CETYLPYRIDINIUM CHLORIDE 0.05 % MT LIQD
7.0000 mL | Freq: Two times a day (BID) | OROMUCOSAL | Status: DC
  Administered 2015-05-16: 7 mL via OROMUCOSAL

## 2015-05-15 MED ORDER — DIPHENHYDRAMINE HCL 50 MG/ML IJ SOLN
INTRAMUSCULAR | Status: AC
  Filled 2015-05-15: qty 1

## 2015-05-15 MED ORDER — FENTANYL CITRATE (PF) 100 MCG/2ML IJ SOLN
INTRAMUSCULAR | Status: AC
  Filled 2015-05-15: qty 2

## 2015-05-15 MED ORDER — PROPOFOL 10 MG/ML IV BOLUS
INTRAVENOUS | Status: DC | PRN
  Administered 2015-05-15: 30 mg via INTRAVENOUS

## 2015-05-15 MED ORDER — SODIUM CHLORIDE 0.9 % IV SOLN
INTRAVENOUS | Status: DC
  Administered 2015-05-16: 1000 mL via INTRAVENOUS
  Administered 2015-05-16 (×2): via INTRAVENOUS
  Administered 2015-05-17: 75 mL/h via INTRAVENOUS
  Administered 2015-05-18: 14:00:00 via INTRAVENOUS

## 2015-05-15 MED ORDER — ASPIRIN EC 81 MG PO TBEC: 81.0000 mg | DELAYED_RELEASE_TABLET | Freq: Every day | ORAL | Status: DC

## 2015-05-15 MED ORDER — SODIUM CHLORIDE 0.9 % IV SOLN: INTRAVENOUS | Status: DC

## 2015-05-15 MED FILL — Medication: Qty: 1 | Status: AC

## 2015-05-15 NOTE — Progress Notes (Signed)
Called in the room by NT to the bedside due to patient having another large bowel movement. Bowel movement was large bloody and clots present. Patient mentation is at baseline. Pt denied weakness and dizziness and stated she felt fine by responding yes and no to my questions.  Called Rapid Response to notified another bowel movement had taken place.  Seconds after speaking to patient she became unresponsive.  Her Eyes fixated and started to breath agonal. Felt for a pulse and none present called called code [id number]

## 2015-05-15 NOTE — Progress Notes (Signed)
Paged  Dr. Silverio Decamp about large bowel movement with large amounts of clots and blood.  MD stated it was to be expected due to finding in colonoscopy.  Orders obtain cbc q day and monitor vital sign q 4 hours.

## 2015-05-15 NOTE — Progress Notes (Signed)
Responded to code blue page to provide support to patient's family and friend at bedside. Per patient's nurse pt was in her usual state of health when she was noted to be unresponsive with agonal breathing. Code blue was called.  Shortly Patient became responsive and was moved to 2MW. Chaplain spent time with one of the pt's twin son Nicole [facility]) and her other son [name]) is in route to hospital.  Family was escorted to [facility] waiting area and was advised that staff would look for family once patient is ready for visit.  Provided emotional and spiritual support, listening and ministry of presence.  Will  Follow as needed.   05/15/15 1600  Clinical Encounter Type  Visited With Patient;Family;Health care provider  Visit Type Initial;Spiritual support;Code  Referral From Nurse  Spiritual Encounters  Spiritual Needs Prayer;Emotional  Stress Factors  Family Stress Factors Exhausted;Health changes  Cristopher Peru, [facility], pager [phone]

## 2015-05-15 NOTE — Progress Notes (Signed)
Subjective:  Awake in no acute distress scheduled for colonoscopy this a.m. Objective:  Vital Signs in the last 24 hours: Temp:  [97.4 F (36.3 C)] 97.4 F (36.3 C) (11/10 0530) Pulse Rate:  [77-87] 77 (11/10 0530) Resp:  [18] 18 (11/10 0530) BP: (105-110)/(63-75) 110/63 mmHg (11/10 0530) SpO2:  [97 %-99 %] 97 % (11/10 0530)  Intake/Output from previous day: 11/09 0701 - 11/10 0700 In: 360 [P.O.:360] Out: -  Intake/Output from this shift:    Physical Exam: Neck: no adenopathy, no carotid bruit, no JVD and supple, symmetrical, trachea midline Lungs: clear to auscultation bilaterally Heart: regular rate and rhythm, S1, S2 normal and Soft systolic murmur noted Abdomen: soft, non-tender; bowel sounds normal; no masses,  no organomegaly Extremities: extremities normal, atraumatic, no cyanosis or edema  Lab Results:  Recent Labs  05/14/15 0708 05/15/15 0700  WBC 14.3* 17.4*  HGB 14.1 12.7  PLT 283 242    Recent Labs  05/14/15 0708 05/15/15 0700  NA 138 134*  K 4.0 3.6  CL 105 100*  CO2 23 26  GLUCOSE 131* 246*  BUN 23* 16  CREATININE 0.78 1.06*   No results for input(s): TROPONINI in the last 72 hours.  Invalid input(s): CK, MB Hepatic Function Panel  Recent Labs  05/13/15 1259  PROT 6.1*  ALBUMIN 3.6  AST 33  ALT 33  ALKPHOS 61  BILITOT 0.6   No results for input(s): CHOL in the last 72 hours. No results for input(s): PROTIME in the last 72 hours.  Imaging: Imaging results have been reviewed and No results found.  Cardiac Studies:  Assessment/Plan:  Status post Acute lower GI bleeding workup in progress Evolving recent left CVA with right sided paresis Mild CAD in the past Hypertension Diabetes mellitus Compensated congestive heart failure secondary to preserve LV systolic function PFO History of tobacco abuse in the past Hyperlipidemia  Plan Continue present management Check colonoscopy results.  LOS: 2 days    Charolette Forward 05/15/2015, 9:37 AM

## 2015-05-15 NOTE — H&P (Addendum)
PULMONARY / CRITICAL CARE MEDICINE   Name: [name] MRN: [id number] DOB: 11-24-1945    ADMISSION DATE:  05/13/2015 CONSULTATION DATE:  05/13/15  REFERRING MD :  Dr. Terrence Dupont  CHIEF COMPLAINT:  GI bleed, cardiac arrest  INITIAL PRESENTATION: 69 year old with coronary artery disease admitted 10/24 with the left ACA stroke discharged on 11/1.  Readmitted on 11/8 for GI bleed, status post colonoscopy 11/10. CODE BLUE called on 11/10 because of unresponsiveness. CPR initiated for 10 minutes with no loss in pulses. Transfer to the ICU for further evaluation.  STUDIES:  10/24. Admitted with Lt ACA stroke >> discharged on 11/1 11/8. Readmitted with hematochezia 11/10. Colonoscopy showed larger stercoral ulcer. Code blue called for unresponsiveness.   SIGNIFICANT EVENTS:  HISTORY OF PRESENT ILLNESS:  [name] is a 69 year old with history of mild coronary artery disease, cardiomyopathy, CHF, cardiac myopathy, hypertension, diabetes mellitus. Admitted on 10/24 for left ACA stroke. She was evaluated by neurology. She did not get lytics as she was out of the treatment window. Discharged on plavix and aspirin. Subsequently readmitted on 11/10 with lower GI bleed. Colonoscopy on 11/10 showed a rectal stercoral ulcer. She continued to have large bloody bowel movements with clots. Subsequently became unresponsive with agonal breathing. CPR initiated for 10 minutes. Reportedly she never lost a pulse.. She regained responsiveness and was transferred to the ICU. Not intubated. Hemodynamically stable.  PAST MEDICAL HISTORY :   has a past medical history of CHF (congestive heart failure) ([place]); Diabetes ([place]) (2016); Hypertension; CVA (cerebral infarction) (04/2015); Patent foramen ovale (04/2015); and Enchondroma of bone (2011).  has past surgical history that includes Cardiac surgery and TEE without cardioversion (N/A, 05/05/2015). Prior to Admission medications   Medication Sig Start Date End Date  Taking? Authorizing Provider  acetaminophen (TYLENOL) 325 MG tablet Take 650 mg by mouth every 6 (six) hours as needed for mild pain.   Yes Historical Provider, MD  amLODipine (NORVASC) 5 MG tablet Take 5 mg by mouth daily.   Yes Historical Provider, MD  atorvastatin (LIPITOR) 40 MG tablet Take 1 tablet (40 mg total) by mouth daily. 05/06/15  Yes Charolette Forward, MD  clopidogrel (PLAVIX) 75 MG tablet Take 1 tablet (75 mg total) by mouth daily. 05/06/15  Yes Charolette Forward, MD  insulin glargine (LANTUS) 100 UNIT/ML injection Inject 0.2 mLs (20 Units total) into the skin at bedtime. 05/06/15  Yes Charolette Forward, MD  insulin regular (NOVOLIN R,HUMULIN R) 100 units/mL injection Inject 0-10 Units into the skin See admin instructions. Per sliding scale: BS 0-69 give 0 units, BS 70-150 give 0 units, BS 151-200 give 2 units, BS 201-250 give 4 units, BS 251-300 give 6 units, BS 301-350 give 8 units, BS 351-400 give 10 units, BS 401 or higher, call MD   Yes Historical Provider, MD  metoprolol succinate (TOPROL-XL) 50 MG 24 hr tablet Take 1 tablet (50 mg total) by mouth daily. 05/06/15  Yes Charolette Forward, MD  miconazole (MICOTIN) 2 % cream Apply 1 application topically 2 (two) times daily.   Yes Historical Provider, MD  nitroGLYCERIN (NITROSTAT) 0.4 MG SL tablet Place 0.4 mg under the tongue every 5 (five) minutes as needed for chest pain.   Yes Historical Provider, MD  pantoprazole (PROTONIX) 40 MG tablet Take 1 tablet (40 mg total) by mouth daily at 6 (six) AM. 11/14/12  Yes Charolette Forward, MD  warfarin (COUMADIN) 3 MG tablet Take 1 tablet (3 mg total) by mouth daily. Patient taking differently: Take 3.5 mg  by mouth daily.  05/06/15  Yes Charolette Forward, MD   No Known Allergies  FAMILY HISTORY:  has no family status information on file.  SOCIAL HISTORY:  reports that she has been smoking Cigarettes.  She does not have any smokeless tobacco history on file. She reports that she does not drink alcohol or use illicit  drugs.  REVIEW OF SYSTEMS:    SUBJECTIVE:   VITAL SIGNS: Temp:  [97.4 F (36.3 C)-98 F (36.7 C)] 97.8 F (36.6 C) (11/10 1615) Pulse Rate:  [69-129] 79 (11/10 1645) Resp:  [9-18] 9 (11/10 1645) BP: (81-133)/(30-87) 87/65 mmHg (11/10 1645) SpO2:  [94 %-100 %] 100 % (11/10 1645) HEMODYNAMICS:   VENTILATOR SETTINGS:   INTAKE / OUTPUT:  Intake/Output Summary (Last 24 hours) at 05/15/15 1657 Last data filed at 05/15/15 1444  Gross per 24 hour  Intake    960 ml  Output      0 ml  Net    960 ml    PHYSICAL EXAMINATION: General:  Awake, oriented HEENT:  Moist mucus membranes Cardiovascular:  RRR, Np MRG Lungs:  Clear anteriorly, no wheeze or crackles Abdomen:  Soft, + BS Skin:  Intact  LABS:  CBC  Recent Labs Lab 05/13/15 2213 05/14/15 0708 05/15/15 0700  WBC 15.0* 14.3* 17.4*  HGB 14.3 14.1 12.7  HCT 43.7 43.2 39.1  PLT 262 283 242   Coag's  Recent Labs Lab 05/13/15 1259  INR 1.76*   BMET  Recent Labs Lab 05/13/15 1259 05/14/15 0708 05/15/15 0700  NA 135 138 134*  K 4.4 4.0 3.6  CL 101 105 100*  CO2 23 23 26   BUN 22* 23* 16  CREATININE 0.90 0.78 1.06*  GLUCOSE 115* 131* 246*   Electrolytes  Recent Labs Lab 05/13/15 1259 05/14/15 0708 05/15/15 0700  CALCIUM 9.5 9.2 8.9   Sepsis Markers No results for input(s): LATICACIDVEN, PROCALCITON, O2SATVEN in the last 168 hours. ABG  Recent Labs Lab 05/15/15 1551  PHART 7.398  PCO2ART 30.1*  PO2ART 442*   Liver Enzymes  Recent Labs Lab 05/13/15 1259  AST 33  ALT 33  ALKPHOS 61  BILITOT 0.6  ALBUMIN 3.6   Cardiac Enzymes No results for input(s): TROPONINI, PROBNP in the last 168 hours. Glucose  Recent Labs Lab 05/14/15 2200 05/15/15 0758 05/15/15 0935 05/15/15 1213 05/15/15 1547 05/15/15 1629  GLUCAP 166* 192* 166* 118* 225* 178*    Imaging Dg Chest Port 1 View  05/15/2015  CLINICAL DATA:  Full code EXAM: PORTABLE CHEST 1 VIEW COMPARISON:  05/01/2015 chest  radiograph. FINDINGS: Defibrillation pad overlies the left chest. Stable cardiomediastinal silhouette with normal heart size. No pneumothorax. No pleural effusion. Clear lungs, with no focal lung consolidation and no pulmonary edema. No displaced fracture. IMPRESSION: No active disease. Electronically Signed   By: Ilona Sorrel M.D.   On: 05/15/2015 16:10     ASSESSMENT / PLAN:  PULMONARY OETT A: Stable P:   Wean down O2 as tolerated  CARDIOVASCULAR CVL A: H/O CAD P:  Follow troponins and EKG. Appears hemodynamically stable. Has good peripheral access.  May need CVL if she continues to bleed.  RENAL A:  Stable P:   Monitor urine output  GASTROINTESTINAL A:  Acute lower GI bleed. Bleeding rectal ulcer. P:   Monitor H/H q6 hr Goal Hb >8 Repeat flex sig Will get surgery involved.  HEMATOLOGIC A:  Acute blood loss anemia P:  Follow H/H Transfuse as needed.  INFECTIOUS A:  UTI P:  BCx2  UC  Sputum Abx: , start date, day   On ceftriaxione. Follow cultures. Check procalcitonin.   NEUROLOGIC A:  Recent ACA stroke P:   Monitor neuro status  FAMILY  - Updates: None at bedside - Inter-disciplinary family meet or Palliative Care meeting due by:  11/17   TODAY'S SUMMARY:    Critical care time- 45 mins.  Marshell Garfinkel MD [facility] Pager [phone] If no answer or after 3pm call: [phone] 05/15/2015, 4:57 PM

## 2015-05-15 NOTE — Progress Notes (Signed)
Notified Dr Terrence Dupont regarding large amount of clotts and blood noted from pt rectum. VS taken and stable. Dr Terrence Dupont referred RN to GI.   [phone] Charge Nurse notified, GI called and Rapid Response.

## 2015-05-15 NOTE — Code Documentation (Signed)
CODE BLUE NOTE  Patient Name: JEANN INCE   MRN: [id number]   Date of Birth/ Sex: [birth year] , female      Admission Date: 05/13/2015  Attending Provider: Charolette Forward, MD  Primary Diagnosis: BRBPR (bright red blood per rectum)    Indication: Pt was in her usual state of health until this PM, when she was noted to be unresponsive with agonal breathing. Code blue was subsequently called. At the time of arrival on scene, ACLS protocol was underway. She received 10 seconds of CPR and then became responsive. She never lost a pulse.    Technical Description:  - CPR performance duration:  10  seconds  - Was defibrillation or cardioversion used? No   - Was external pacer placed? No  - Was patient intubated pre/post CPR? No    Medications Administered: Y = Yes; Blank = No Amiodarone    Atropine    Calcium    Epinephrine    Lidocaine    Magnesium    Norepinephrine    Phenylephrine    Sodium bicarbonate    Vasopressin      Post CPR evaluation:  - Final Status - Was patient successfully resuscitated ? Yes - What is current rhythm? Sinus tachycardia - What is current hemodynamic status? Stable, BPs 90s/70s   Miscellaneous Information:  - Labs sent, including: CBC, BMET, troponin, CXR  - Primary team notified?  Yes  - Family Notified? Yes  - Additional notes/ transfer status: Transfer to stepdown.        Sela Hua, MD  05/15/2015, 3:54 PM

## 2015-05-15 NOTE — Op Note (Signed)
[facility] [place], [address]   COLONOSCOPY PROCEDURE REPORT  PATIENT: [name], [name]  MR#: [id number] BIRTHDATE: 1945/11/12 , 44  yrs. old GENDER: female ENDOSCOPIST: Harl Bowie, MD REFERRED [id number] Hospitalists PROCEDURE DATE:  05/15/2015 PROCEDURE:   Colonoscopy, diagnostic First Screening Colonoscopy - Avg.  risk and is 50 yrs.  old or older - No.  Prior Negative Screening - Now for repeat screening. N/A  History of Adenoma - Now for follow-up colonoscopy & has been > or = to 3 yrs.  N/A  Polyps removed today? No ASA CLASS:   Class IV INDICATIONS:Evaluation of unexplained GI bleeding. MEDICATIONS: Monitored anesthesia care  DESCRIPTION OF PROCEDURE:   After the risks benefits and alternatives of the procedure were thoroughly explained, informed consent was obtained.  The digital rectal exam revealed no abnormalities of the rectum.   The Pentax Ped Colon [id number] endoscope was introduced through the anus and advanced to the cecum, which was identified by both the appendix and ileocecal valve. No adverse events experienced.   The quality of the prep was poor.  inadequate  The instrument was then slowly withdrawn as the colon was fully examined. Estimated blood loss is zero unless otherwise noted in this procedure report.  COLON FINDINGS: Poor quality prep with inadequate visualization of the mucosa.  Few adherent clots noted in the rectum, otherwise no clots or blood in the rest of the colon.  Visible mucosa appeared normal in the rest of the colon.  Could have missed polyps or lesions due to poor prep. Clean-based ulcer was seen in the rectum close to dentate line , likely stercoral ulcer. 6-7 mm polyp was noted in the rectum but not removed due to risk of bleeding as patient was on aspirin and Plavix and Coumadin until 2 days ago, based on associated risks and comorbid conditions unclear if patient would benefit with  repeat colonoscopy and possible polypectomy.  Retroflexion was performed. The time to cecum = 16.3 Withdrawal time = 8.3   The scope was withdrawn and the procedure completed. COMPLICATIONS: There were no immediate complications.  ENDOSCOPIC IMPRESSION: Poor quality prep Rectal stercoral ulcer likely source of bleeding  RECOMMENDATIONS: Ok to restart start plavix and coumadin tomorrow Daily bowel regimen with Miralax twice daily to prevent constipation Canasa suppository at bedtime for 1 week Daily Benefiber   eSigned:  Harl Bowie, MD 05/15/2015 10:57 AM revise   PATIENT NAME:  [name], [name] MR#: [id number]

## 2015-05-15 NOTE — Progress Notes (Signed)
Subjective:  Called to see patient does she became unresponsive with agonal breathing noted to have massive bright red blood per rectum associated with hypotension. Received IV fluid bolus with improvement in her blood pressure and mentation. Patient presently awake follows commands. Able to answer questions in yes and no. blood pressure in high 90s and low [id number] systolic now. EKG no acute ischemic changes noted. Had colonoscopy earlier today noted to have strercoral ulcer no active bleeding was noted.  Objective:  Vital Signs in the last 24 hours: Temp:  [97.4 F (36.3 C)-98 F (36.7 C)] 98 F (36.7 C) (11/10 1445) Pulse Rate:  [69-129] 76 (11/10 1553) Resp:  [13-18] 15 (11/10 1057) BP: (81-133)/(30-87) 92/61 mmHg (11/10 1553) SpO2:  [94 %-100 %] 100 % (11/10 1553)  Intake/Output from previous day: 11/09 0701 - 11/10 0700 In: 360 [P.O.:360] Out: -  Intake/Output from this shift: Total I/O In: 960 [P.O.:360; I.V.:600] Out: -   Physical Exam: Neck: no adenopathy, no carotid bruit, no JVD and supple, symmetrical, trachea midline Lungs: Clear to auscultation anteriorly Heart: regular rate and rhythm, S1, S2 normal and Soft systolic murmur noted Abdomen: soft, non-tender; bowel sounds normal; no masses,  no organomegaly Extremities: extremities normal, atraumatic, no cyanosis or edema Neuro right-sided paresis Lab Results:  Recent Labs  05/14/15 0708 05/15/15 0700  WBC 14.3* 17.4*  HGB 14.1 12.7  PLT 283 242    Recent Labs  05/14/15 0708 05/15/15 0700  NA 138 134*  K 4.0 3.6  CL 105 100*  CO2 23 26  GLUCOSE 131* 246*  BUN 23* 16  CREATININE 0.78 1.06*   No results for input(s): TROPONINI in the last 72 hours.  Invalid input(s): CK, MB Hepatic Function Panel  Recent Labs  05/13/15 1259  PROT 6.1*  ALBUMIN 3.6  AST 33  ALT 33  ALKPHOS 61  BILITOT 0.6   No results for input(s): CHOL in the last 72 hours. No results for input(s): PROTIME in the last 72  hours.  Imaging: Imaging results have been reviewed and Dg Chest Port 1 View  05/15/2015  CLINICAL DATA:  Full code EXAM: PORTABLE CHEST 1 VIEW COMPARISON:  05/01/2015 chest radiograph. FINDINGS: Defibrillation pad overlies the left chest. Stable cardiomediastinal silhouette with normal heart size. No pneumothorax. No pleural effusion. Clear lungs, with no focal lung consolidation and no pulmonary edema. No displaced fracture. IMPRESSION: No active disease. Electronically Signed   By: Ilona Sorrel M.D.   On: 05/15/2015 16:10    Cardiac Studies:  Assessment/Plan:  Recurrent lower GI bleed Status post hypotensive shock Recent left CVA with right paresis Mild CAD in the past Hypertension Diabetes mellitus Compensated CHF secondary to preserved LV systolic function PFO History of tobacco abuse in the past Hyperlipidemia Plan Hold antiplatelets and anticoagulants GI paged Check labs Check UA and urine culture Start empiric Rocephin as per orders Transfer to ICU   LOS: 2 days    Charolette Forward 05/15/2015, 4:01 PM

## 2015-05-15 NOTE — Consult Note (Addendum)
Reason for Consult:bleeding rectal ulcer Referring Physician: Majorie Cameron is an 69 y.o. female.  HPI: [name] suffered a L ACA CVA on 10/24 and was admitted at that time. She was D/C on Plavix and ASA on 11/1. She developed blood per rectum and was readmitted. Work-up has included GI consultation and she underwent a colonoscopy today by Dr. Silverio Decamp. A rectal ulcer was seen C/W a stercoral ulcer. Later today, she became unresponsive and was coded and transferred to the ICU. Dr. Silverio Decamp did a F/U flex sig and placed some clips on the ulcer which was bleeding. She asked me to see her in consultation for management should there be further bleeding. [name] has expressive aphasia and cannot assist with history. Per her RN her sons left for the day. Minimal BPR since clipping. No coumadin since 11/7, no Plavix since 11/8.  Past Medical History  Diagnosis Date  . CHF (congestive heart failure) (HCC)     EF 07-37%, grade 1 diastolic dysfunction per echo 04/2015  . Diabetes ([place]) 2016    Type II. On insulin  . Hypertension   . CVA (cerebral infarction) 04/2015    Started Plavix 04/2015  . Patent foramen ovale 04/2015    Started on warfarin 04/2015  . Enchondroma of bone 2011    left femur.     Past Surgical History  Procedure Laterality Date  . Cardiac surgery      Cath without stent  . Tee without cardioversion N/A 05/05/2015    Procedure: TRANSESOPHAGEAL ECHOCARDIOGRAM (TEE);  Surgeon: Dixie Dials, MD;  Location: [facility] ENDOSCOPY;  Service: Cardiovascular;  Laterality: N/A;    Family History  Problem Relation Age of Onset  . Hypertension Mother   . Heart failure Mother   . Hyperlipidemia Mother     Social History:  reports that she has been smoking Cigarettes.  She does not have any smokeless tobacco history on file. She reports that she does not drink alcohol or use illicit drugs.  Allergies: No Known Allergies  Medications:  Scheduled: . atorvastatin   40 mg Oral Daily  . cefTRIAXone (ROCEPHIN)  IV  1 g Intravenous Q24H  . insulin aspart  0-15 Units Subcutaneous TID WC  . insulin aspart  0-5 Units Subcutaneous QHS  . insulin glargine  10 Units Subcutaneous QHS  . mesalamine  1,000 mg Rectal QHS  . pantoprazole  40 mg Oral Q0600   Continuous: . sodium chloride    . sodium chloride     TGG:YIRSWNIOEVOJJ **OR** acetaminophen, alum & mag hydroxide-simeth, ondansetron **OR** ondansetron (ZOFRAN) IV  Results for orders placed or performed during the hospital encounter of 05/13/15 (from the past 48 hour(s))  Glucose, capillary     Status: Abnormal   Collection Time: 05/13/15  9:23 PM  Result Value Ref Range   Glucose-Capillary 264 (H) 65 - 99 mg/dL  CBC     Status: Abnormal   Collection Time: 05/13/15 10:13 PM  Result Value Ref Range   WBC 15.0 (H) 4.0 - 10.5 K/uL   RBC 5.10 3.87 - 5.11 MIL/uL   Hemoglobin 14.3 12.0 - 15.0 g/dL   HCT 43.7 36.0 - 46.0 %   MCV 85.7 78.0 - 100.0 fL   MCH 28.0 26.0 - 34.0 pg   MCHC 32.7 30.0 - 36.0 g/dL   RDW 12.8 11.5 - 15.5 %   Platelets 262 150 - 400 K/uL  Basic metabolic panel     Status: Abnormal   Collection  Time: 05/14/15  7:08 AM  Result Value Ref Range   Sodium 138 135 - 145 mmol/L   Potassium 4.0 3.5 - 5.1 mmol/L   Chloride 105 101 - 111 mmol/L   CO2 23 22 - 32 mmol/L   Glucose, Bld 131 (H) 65 - 99 mg/dL   BUN 23 (H) 6 - 20 mg/dL   Creatinine, Ser 0.78 0.44 - 1.00 mg/dL   Calcium 9.2 8.9 - 10.3 mg/dL   GFR calc non Af Amer >60 >60 mL/min   GFR calc Af Amer >60 >60 mL/min    Comment: (NOTE) The eGFR has been calculated using the CKD EPI equation. This calculation has not been validated in all clinical situations. eGFR's persistently <60 mL/min signify possible Chronic Kidney Disease.    Anion gap 10 5 - 15  CBC     Status: Abnormal   Collection Time: 05/14/15  7:08 AM  Result Value Ref Range   WBC 14.3 (H) 4.0 - 10.5 K/uL   RBC 5.02 3.87 - 5.11 MIL/uL   Hemoglobin 14.1 12.0 -  15.0 g/dL   HCT 43.2 36.0 - 46.0 %   MCV 86.1 78.0 - 100.0 fL   MCH 28.1 26.0 - 34.0 pg   MCHC 32.6 30.0 - 36.0 g/dL   RDW 13.0 11.5 - 15.5 %   Platelets 283 150 - 400 K/uL  Glucose, capillary     Status: Abnormal   Collection Time: 05/14/15  7:41 AM  Result Value Ref Range   Glucose-Capillary 135 (H) 65 - 99 mg/dL  Glucose, capillary     Status: Abnormal   Collection Time: 05/14/15 12:10 PM  Result Value Ref Range   Glucose-Capillary 182 (H) 65 - 99 mg/dL  Glucose, capillary     Status: Abnormal   Collection Time: 05/14/15  6:51 PM  Result Value Ref Range   Glucose-Capillary 221 (H) 65 - 99 mg/dL  Glucose, capillary     Status: Abnormal   Collection Time: 05/14/15 10:00 PM  Result Value Ref Range   Glucose-Capillary 166 (H) 65 - 99 mg/dL  Basic metabolic panel     Status: Abnormal   Collection Time: 05/15/15  7:00 AM  Result Value Ref Range   Sodium 134 (L) 135 - 145 mmol/L   Potassium 3.6 3.5 - 5.1 mmol/L   Chloride 100 (L) 101 - 111 mmol/L   CO2 26 22 - 32 mmol/L   Glucose, Bld 246 (H) 65 - 99 mg/dL   BUN 16 6 - 20 mg/dL   Creatinine, Ser 1.06 (H) 0.44 - 1.00 mg/dL   Calcium 8.9 8.9 - 10.3 mg/dL   GFR calc non Af Amer 52 (L) >60 mL/min   GFR calc Af Amer >60 >60 mL/min    Comment: (NOTE) The eGFR has been calculated using the CKD EPI equation. This calculation has not been validated in all clinical situations. eGFR's persistently <60 mL/min signify possible Chronic Kidney Disease.    Anion gap 8 5 - 15  CBC     Status: Abnormal   Collection Time: 05/15/15  7:00 AM  Result Value Ref Range   WBC 17.4 (H) 4.0 - 10.5 K/uL   RBC 4.51 3.87 - 5.11 MIL/uL   Hemoglobin 12.7 12.0 - 15.0 g/dL   HCT 39.1 36.0 - 46.0 %   MCV 86.7 78.0 - 100.0 fL   MCH 28.2 26.0 - 34.0 pg   MCHC 32.5 30.0 - 36.0 g/dL   RDW 13.0 11.5 - 15.5 %  Platelets 242 150 - 400 K/uL  Glucose, capillary     Status: Abnormal   Collection Time: 05/15/15  7:58 AM  Result Value Ref Range    Glucose-Capillary 192 (H) 65 - 99 mg/dL  Glucose, capillary     Status: Abnormal   Collection Time: 05/15/15  9:35 AM  Result Value Ref Range   Glucose-Capillary 166 (H) 65 - 99 mg/dL  Glucose, capillary     Status: Abnormal   Collection Time: 05/15/15 12:13 PM  Result Value Ref Range   Glucose-Capillary 118 (H) 65 - 99 mg/dL  Glucose, capillary     Status: Abnormal   Collection Time: 05/15/15  3:47 PM  Result Value Ref Range   Glucose-Capillary 225 (H) 65 - 99 mg/dL  Blood gas, arterial     Status: Abnormal   Collection Time: 05/15/15  3:51 PM  Result Value Ref Range   FIO2 1.00    Delivery systems NON-REBREATHER OXYGEN MASK    pH, Arterial 7.398 7.350 - 7.450   pCO2 arterial 30.1 (L) 35.0 - 45.0 mmHg   pO2, Arterial 442 (H) 80.0 - 100.0 mmHg   Bicarbonate 18.2 (L) 20.0 - 24.0 mEq/L   TCO2 19.1 0 - 100 mmol/L   Acid-base deficit 5.8 (H) 0.0 - 2.0 mmol/L   O2 Saturation 99.8 %   Patient temperature 98.6    Collection site LEFT RADIAL    Drawn by [phone]    Sample type ARTERIAL DRAW    Allens test (pass/fail) PASS PASS  Glucose, capillary     Status: Abnormal   Collection Time: 05/15/15  4:29 PM  Result Value Ref Range   Glucose-Capillary 178 (H) 65 - 99 mg/dL  CBC     Status: Abnormal   Collection Time: 05/15/15  4:30 PM  Result Value Ref Range   WBC 16.5 (H) 4.0 - 10.5 K/uL   RBC 3.46 (L) 3.87 - 5.11 MIL/uL   Hemoglobin 9.8 (L) 12.0 - 15.0 g/dL    Comment: REPEATED TO VERIFY   HCT 30.3 (L) 36.0 - 46.0 %   MCV 87.6 78.0 - 100.0 fL   MCH 28.3 26.0 - 34.0 pg   MCHC 32.3 30.0 - 36.0 g/dL   RDW 13.0 11.5 - 15.5 %   Platelets 251 150 - 400 K/uL  Basic metabolic panel     Status: Abnormal   Collection Time: 05/15/15  4:30 PM  Result Value Ref Range   Sodium 138 135 - 145 mmol/L   Potassium 3.7 3.5 - 5.1 mmol/L   Chloride 108 101 - 111 mmol/L   CO2 21 (L) 22 - 32 mmol/L   Glucose, Bld 212 (H) 65 - 99 mg/dL   BUN 13 6 - 20 mg/dL   Creatinine, Ser 0.88 0.44 - 1.00 mg/dL    Calcium 8.0 (L) 8.9 - 10.3 mg/dL   GFR calc non Af Amer >60 >60 mL/min   GFR calc Af Amer >60 >60 mL/min    Comment: (NOTE) The eGFR has been calculated using the CKD EPI equation. This calculation has not been validated in all clinical situations. eGFR's persistently <60 mL/min signify possible Chronic Kidney Disease.    Anion gap 9 5 - 15  Troponin I     Status: None   Collection Time: 05/15/15  4:30 PM  Result Value Ref Range   Troponin I <0.03 <0.031 ng/mL    Comment:        NO INDICATION OF MYOCARDIAL INJURY.     Dg  Chest Port 1 View  05/15/2015  CLINICAL DATA:  Full code EXAM: PORTABLE CHEST 1 VIEW COMPARISON:  05/01/2015 chest radiograph. FINDINGS: Defibrillation pad overlies the left chest. Stable cardiomediastinal silhouette with normal heart size. No pneumothorax. No pleural effusion. Clear lungs, with no focal lung consolidation and no pulmonary edema. No displaced fracture. IMPRESSION: No active disease. Electronically Signed   By: Ilona Sorrel M.D.   On: 05/15/2015 16:10    Review of Systems  Unable to perform ROS: mental status change   Blood pressure 112/60, pulse 81, temperature 97.8 F (36.6 C), temperature source Oral, resp. rate 12, weight 73 kg (160 lb 15 oz), SpO2 100 %. Physical Exam  Constitutional: She appears well-developed.  HENT:  Head: Normocephalic.  Nose: Nose normal.  Mouth/Throat: Oropharynx is clear and moist.  Scarring R earlobe  Neck: Neck supple. No tracheal deviation present.  Cardiovascular: Normal rate and normal heart sounds.   Respiratory: Effort normal and breath sounds normal. No stridor. No respiratory distress. She has no wheezes.  GI: Soft. She exhibits no distension. There is no tenderness. There is no rebound and no guarding.  Lower midline and Pfannenstiel scars  Genitourinary:  External anal area with no blood at this time  Musculoskeletal: She exhibits no tenderness.  Neurological:  Non-verbal, arouses and F/C with  LUE, no movement R side  Skin: Skin is warm.    Assessment/Plan: BPR due to rectal ulcer with anticoagulation and antiplatelet therapy. Coumadin and Plavix have been held. INR yesterday 1.76. Clipping by Dr. Silverio Decamp seems to have controlled it. If she starts bleeding again, I will plan bedside rectal packing with gelfoam for hemostasis. If this fails, she may need operative intervention. It will be good to avoid surgery with her recent CVA. We will follow.  , E 05/15/2015, 5:47 PM

## 2015-05-15 NOTE — Progress Notes (Signed)
[facility] History and Physical   Primary Care Physician:  Charolette Forward, MD   Reason for Procedure:  Bright red blood per rectum Plan:    Colonoscopy with possible intervention The risks and benefits as well as alternatives of endoscopic procedure(s) have been discussed and reviewed. All questions answered. The patient and her son agreed to proceed.     HPI: [name] is a 69 y.o. female with history of CHF and recent CVA in the past 2 weeks on aspirin and Plavix and Coumadin was admitted after passing bright red blood per rectum. Patient completed the bowel prep. Towards the end of the prep patient's bowel movements were clear and no blood per nursing staff.  Past Medical History  Diagnosis Date  . CHF (congestive heart failure) (HCC)     EF 99991111, grade 1 diastolic dysfunction per echo 04/2015  . Diabetes ([place]) 2016    Type II. On insulin  . Hypertension   . CVA (cerebral infarction) 04/2015    Started Plavix 04/2015  . Patent foramen ovale 04/2015    Started on warfarin 04/2015  . Enchondroma of bone 2011    left femur.     Past Surgical History  Procedure Laterality Date  . Cardiac surgery      Cath without stent  . Tee without cardioversion N/A 05/05/2015    Procedure: TRANSESOPHAGEAL ECHOCARDIOGRAM (TEE);  Surgeon: Dixie Dials, MD;  Location: [facility] ENDOSCOPY;  Service: Cardiovascular;  Laterality: N/A;    Prior to Admission medications   Medication Sig Start Date End Date Taking? Authorizing Provider  acetaminophen (TYLENOL) 325 MG tablet Take 650 mg by mouth every 6 (six) hours as needed for mild pain.   Yes Historical Provider, MD  amLODipine (NORVASC) 5 MG tablet Take 5 mg by mouth daily.   Yes Historical Provider, MD  atorvastatin (LIPITOR) 40 MG tablet Take 1 tablet (40 mg total) by mouth daily. 05/06/15  Yes Charolette Forward, MD  clopidogrel (PLAVIX) 75 MG tablet Take 1 tablet (75 mg total) by mouth daily. 05/06/15  Yes Charolette Forward, MD   insulin glargine (LANTUS) 100 UNIT/ML injection Inject 0.2 mLs (20 Units total) into the skin at bedtime. 05/06/15  Yes Charolette Forward, MD  insulin regular (NOVOLIN R,HUMULIN R) 100 units/mL injection Inject 0-10 Units into the skin See admin instructions. Per sliding scale: BS 0-69 give 0 units, BS 70-150 give 0 units, BS 151-200 give 2 units, BS 201-250 give 4 units, BS 251-300 give 6 units, BS 301-350 give 8 units, BS 351-400 give 10 units, BS 401 or higher, call MD   Yes Historical Provider, MD  metoprolol succinate (TOPROL-XL) 50 MG 24 hr tablet Take 1 tablet (50 mg total) by mouth daily. 05/06/15  Yes Charolette Forward, MD  miconazole (MICOTIN) 2 % cream Apply 1 application topically 2 (two) times daily.   Yes Historical Provider, MD  nitroGLYCERIN (NITROSTAT) 0.4 MG SL tablet Place 0.4 mg under the tongue every 5 (five) minutes as needed for chest pain.   Yes Historical Provider, MD  pantoprazole (PROTONIX) 40 MG tablet Take 1 tablet (40 mg total) by mouth daily at 6 (six) AM. 11/14/12  Yes Charolette Forward, MD  warfarin (COUMADIN) 3 MG tablet Take 1 tablet (3 mg total) by mouth daily. Patient taking differently: Take 3.5 mg by mouth daily.  05/06/15  Yes Charolette Forward, MD    Current Facility-Administered Medications  Medication Dose Route Frequency Provider Last Rate Last Dose  . acetaminophen (TYLENOL) tablet 650  mg  650 mg Oral Q6H PRN Radene Gunning, NP       Or  . acetaminophen (TYLENOL) suppository 650 mg  650 mg Rectal Q6H PRN Radene Gunning, NP      . alum & mag hydroxide-simeth (MAALOX/MYLANTA) 200-200-20 MG/5ML suspension 30 mL  30 mL Oral Q6H PRN Radene Gunning, NP      . atorvastatin (LIPITOR) tablet 40 mg  40 mg Oral Daily Radene Gunning, NP   Stopped at 05/15/15 1000  . insulin aspart (novoLOG) injection 0-15 Units  0-15 Units Subcutaneous TID WC Radene Gunning, NP   3 Units at 05/15/15 0825  . insulin aspart (novoLOG) injection 0-5 Units  0-5 Units Subcutaneous QHS Radene Gunning, NP   3  Units at 05/13/15 2211  . insulin glargine (LANTUS) injection 10 Units  10 Units Subcutaneous QHS Radene Gunning, NP   10 Units at 05/14/15 2310  . ondansetron (ZOFRAN) tablet 4 mg  4 mg Oral Q6H PRN Radene Gunning, NP       Or  . ondansetron [facility]) injection 4 mg  4 mg Intravenous Q6H PRN Radene Gunning, NP      . pantoprazole (PROTONIX) EC tablet 40 mg  40 mg Oral Q0600 Radene Gunning, NP   40 mg at 05/14/15 0522    Allergies as of 05/13/2015  . (No Known Allergies)    Family History  Problem Relation Age of Onset  . Hypertension Mother   . Heart failure Mother   . Hyperlipidemia Mother     Social History   Social History  . Marital Status: Married    Spouse Name: N/A  . Number of Children: N/A  . Years of Education: N/A   Occupational History  . Not on file.   Social History Main Topics  . Smoking status: Current Every Day Smoker    Types: Cigarettes  . Smokeless tobacco: Not on file  . Alcohol Use: No  . Drug Use: No  . Sexual Activity: Not on file   Other Topics Concern  . Not on file   Social History Narrative    Review of Systems:  All other review of systems negative except as mentioned in the HPI.  Physical Exam: Vital signs in last 24 hours: Temp:  [97.4 F (36.3 C)] 97.4 F (36.3 C) (11/10 0530) Pulse Rate:  [77-87] 77 (11/10 0530) Resp:  [18] 18 (11/10 0530) BP: (105-110)/(63-75) 110/63 mmHg (11/10 0530) SpO2:  [97 %-99 %] 97 % (11/10 0530) Last BM Date: 05/14/15 General:   Alert,  pleasant and cooperative in NAD Lungs:  Clear throughout to auscultation.   Heart:  Regular rate and rhythm; no murmurs, clicks, rubs,  or gallops. Abdomen:  Soft, nontender and nondistended. Normal bowel sounds.   Neuro/Psych:  Alert and cooperative. Normal mood and affect. facial droop and dysarthria, can respond with yes and no   @K .Denzil Magnuson, MD [facility] [phone] (pager) 05/15/2015 9:27 AM@

## 2015-05-15 NOTE — Anesthesia Preprocedure Evaluation (Signed)
Anesthesia Evaluation  Patient identified by MRN, date of birth, ID band Patient awake    Reviewed: Allergy & Precautions, H&P , NPO status , Patient's Chart, lab work & pertinent test results  History of Anesthesia Complications Negative for: history of anesthetic complications  Airway Mallampati: II  TM Distance: >3 FB Neck ROM: full    Dental no notable dental hx. (+) Dental Advisory Given   Pulmonary neg pulmonary ROS, Current Smoker,    Pulmonary exam normal breath sounds clear to auscultation       Cardiovascular hypertension, Pt. on medications Normal cardiovascular exam Rhythm:regular Rate:Normal  EF 99991111, grade 1 diastolic dysfunction per echo 04/2015   Neuro/Psych CVA 04/2015, cleared by neurology for colonoscopy, is on aspirin and plavix, residual right sided weakness, appears to have dysarthria as well CVA, Residual Symptoms    GI/Hepatic negative GI ROS, Neg liver ROS,   Endo/Other  negative endocrine ROSdiabetes  Renal/GU negative Renal ROS     Musculoskeletal   Abdominal   Peds  Hematology negative hematology ROS (+)   Anesthesia Other Findings   Reproductive/Obstetrics negative OB ROS                             Anesthesia Physical Anesthesia Plan  ASA: III  Anesthesia Plan: MAC   Post-op Pain Management:    Induction: Intravenous  Airway Management Planned: Simple Face Mask  Additional Equipment:   Intra-op Plan:   Post-operative Plan:   Informed Consent: I have reviewed the patients History and Physical, chart, labs and discussed the procedure including the risks, benefits and alternatives for the proposed anesthesia with the patient or authorized representative who has indicated his/her understanding and acceptance.   Dental Advisory Given  Plan Discussed with: Anesthesiologist, CRNA and Surgeon  Anesthesia Plan Comments: (Propofol infusion, will keep  BP at baseline with phenylephrine support given recent history of CVA and based on neurology consult)        Anesthesia Quick Evaluation

## 2015-05-15 NOTE — Op Note (Signed)
[facility] [place], [id number]   COLONOSCOPY PROCEDURE REPORT  PATIENT: [name], [name]  MR#: [id number] BIRTHDATE: 06-06-1946 , 29  yrs. old GENDER: female ENDOSCOPIST: Harl Bowie, MD REFERRED [id number] Hospitalists PROCEDURE DATE:  05/15/2015 PROCEDURE:  ASA CLASS:   Class IV INDICATIONS:Evaluation of unexplained GI bleeding. MEDICATIONS: unsedated  DESCRIPTION OF PROCEDURE:   After the risks benefits and alternatives of the procedure were thoroughly explained, informed consent was obtained.  The digital rectal exam      The Pentax Ped Colon [id number]  endoscope was introduced through the anus and advanced to the sigmoid colon. No adverse events experienced.   The quality of the prep was good.  The instrument was then slowly withdrawn as the colon was fully examined. Estimated blood loss is zero unless otherwise noted in this procedure report.   COLON FINDINGS: Large amount of clots noted in the rectum, which were washed and suctioned.  Active oozing of blood noted in the distal rectum close to the anal verge, did not achieve hemostasis with bipolar cautery and place 2 hemostatic clips with good hemostasis.  Retroflexion was performed. The time to cecum = Withdrawal time =      The scope was withdrawn and the procedure completed. COMPLICATIONS: There were no immediate complications.  ENDOSCOPIC IMPRESSION: Large amount of clots noted in the rectum, which were washed and suctioned.  Active oozing of blood noted in the distal rectum close to the anal verge, did not achieve hemostasis with bipolar cautery and placed 2 hemostatic clips with good hemostasis  RECOMMENDATIONS: Monitor Hgb and transfuse as needed to maintain Hgb >8 Surgery consult if has recurrent bleed for possible packing of the rectum/surgical intervention   eSigned:  Harl Bowie, MD 05/15/2015 5:49 PM

## 2015-05-15 NOTE — Progress Notes (Signed)
Code Blue initiated. Pt transferred to 2MW. Report given to Magda Paganini, [place].

## 2015-05-15 NOTE — Transfer of Care (Signed)
Immediate Anesthesia Transfer of Care Note  Patient: [name]  Procedure(s) Performed: Procedure(s): COLONOSCOPY (N/A)  Patient Location: endo recovery  Anesthesia Type:MAC  Level of Consciousness: awake and patient cooperative  Airway & Oxygen Therapy: Patient Spontanous Breathing and Patient connected to face mask oxygen  Post-op Assessment: Report given to RN, Post -op Vital signs reviewed and stable, Patient moving all extremities and Patient moving all extremities X 4  Post vital signs: Reviewed and stable  Last Vitals:  Filed Vitals:   05/15/15 0937  BP: 111/66  Pulse:   Temp:   Resp: 13    Complications: No apparent anesthesia complications

## 2015-05-16 ENCOUNTER — Encounter (HOSPITAL_COMMUNITY): Payer: Self-pay | Admitting: Gastroenterology

## 2015-05-16 ENCOUNTER — Inpatient Hospital Stay (HOSPITAL_COMMUNITY): Payer: Medicare Other

## 2015-05-16 DIAGNOSIS — K626 Ulcer of anus and rectum: Secondary | ICD-10-CM

## 2015-05-16 HISTORY — DX: Ulcer of anus and rectum: K62.6

## 2015-05-16 LAB — PROCALCITONIN

## 2015-05-16 LAB — BLOOD GAS, ARTERIAL
Acid-base deficit: 3.1 mmol/L — ABNORMAL HIGH (ref 0.0–2.0)
BICARBONATE: 20.7 meq/L (ref 20.0–24.0)
DRAWN BY: 29017
O2 Content: 2 L/min
O2 Saturation: 98.2 %
PH ART: 7.415 (ref 7.350–7.450)
Patient temperature: 98.6
TCO2: 21.7 mmol/L (ref 0–100)
pCO2 arterial: 32.9 mmHg — ABNORMAL LOW (ref 35.0–45.0)
pO2, Arterial: 118 mmHg — ABNORMAL HIGH (ref 80.0–100.0)

## 2015-05-16 LAB — CBC
HCT: 25.3 % — ABNORMAL LOW (ref 36.0–46.0)
Hemoglobin: 8.2 g/dL — ABNORMAL LOW (ref 12.0–15.0)
MCH: 28.2 pg (ref 26.0–34.0)
MCHC: 32.4 g/dL (ref 30.0–36.0)
MCV: 86.9 fL (ref 78.0–100.0)
PLATELETS: 208 10*3/uL (ref 150–400)
RBC: 2.91 MIL/uL — ABNORMAL LOW (ref 3.87–5.11)
RDW: 12.9 % (ref 11.5–15.5)
WBC: 10.8 10*3/uL — ABNORMAL HIGH (ref 4.0–10.5)

## 2015-05-16 LAB — BASIC METABOLIC PANEL
Anion gap: 5 (ref 5–15)
BUN: 8 mg/dL (ref 6–20)
CALCIUM: 7.9 mg/dL — AB (ref 8.9–10.3)
CO2: 24 mmol/L (ref 22–32)
Chloride: 112 mmol/L — ABNORMAL HIGH (ref 101–111)
Creatinine, Ser: 0.72 mg/dL (ref 0.44–1.00)
GFR calc Af Amer: >60 mL/min (ref >60)
GFR calc non Af Amer: >60 mL/min (ref >60)
GLUCOSE: 145 mg/dL — AB (ref 65–99)
POTASSIUM: 3.5 mmol/L (ref 3.5–5.1)
Sodium: 141 mmol/L (ref 135–145)

## 2015-05-16 LAB — HEMOGLOBIN AND HEMATOCRIT, BLOOD
HCT: 23.7 % — ABNORMAL LOW (ref 36.0–46.0)
HEMATOCRIT: 26.1 % — AB (ref 36.0–46.0)
HEMATOCRIT: 23.9 % — AB (ref 36.0–46.0)
HEMOGLOBIN: 7.7 g/dL — AB (ref 12.0–15.0)
Hemoglobin: 8.3 g/dL — ABNORMAL LOW (ref 12.0–15.0)
Hemoglobin: 7.7 g/dL — ABNORMAL LOW (ref 12.0–15.0)

## 2015-05-16 LAB — LACTIC ACID, PLASMA: LACTIC ACID, VENOUS: 1.7 mmol/L (ref 0.5–2.0)

## 2015-05-16 LAB — GLUCOSE, CAPILLARY
GLUCOSE-CAPILLARY: 107 mg/dL — AB (ref 65–99)
GLUCOSE-CAPILLARY: 109 mg/dL — AB (ref 65–99)
GLUCOSE-CAPILLARY: 69 mg/dL (ref 65–99)
Glucose-Capillary: 83 mg/dL (ref 65–99)

## 2015-05-16 LAB — PHOSPHORUS: Phosphorus: 2.4 mg/dL — ABNORMAL LOW (ref 2.5–4.6)

## 2015-05-16 LAB — MAGNESIUM: MAGNESIUM: 1.7 mg/dL (ref 1.7–2.4)

## 2015-05-16 MED ORDER — WHITE PETROLATUM GEL
Status: AC
  Filled 2015-05-16: qty 1

## 2015-05-16 MED ORDER — DEXTROSE 50 % IV SOLN
INTRAVENOUS | Status: AC
  Administered 2015-05-16: 50 mL via INTRAVENOUS
  Filled 2015-05-16: qty 50

## 2015-05-16 MED ORDER — FENTANYL CITRATE (PF) 100 MCG/2ML IJ SOLN
12.5000 ug | INTRAMUSCULAR | Status: DC | PRN
  Administered 2015-05-17: 12.5 ug via INTRAVENOUS
  Administered 2015-05-17: 25 ug via INTRAVENOUS
  Administered 2015-05-18: 50 ug via INTRAVENOUS
  Filled 2015-05-16 (×4): qty 2

## 2015-05-16 MED ORDER — SODIUM CHLORIDE 0.9 % IV SOLN
Freq: Once | INTRAVENOUS | Status: AC
  Administered 2015-05-17: 02:00:00 via INTRAVENOUS

## 2015-05-16 NOTE — Progress Notes (Signed)
Pt unable to void. Last documented void was 6pm on 05/15/15. Bladder scan showed 471ml. CCM Resident made aware. Order for in/out cath.

## 2015-05-16 NOTE — Progress Notes (Signed)
PULMONARY / CRITICAL CARE MEDICINE   Name: [name] MRN: [id number] DOB: 03-22-1946    ADMISSION DATE:  05/13/2015 CONSULTATION DATE:  05/13/15  REFERRING MD :  Dr. Terrence Dupont  CHIEF COMPLAINT:  GI bleed, cardiac arrest  INITIAL PRESENTATION: 69 year old with coronary artery disease admitted 10/24 with the left ACA stroke discharged on 11/1.  Readmitted on 11/8 for GI bleed, status post colonoscopy 11/10. CODE BLUE called on 11/10 because of unresponsiveness. CPR initiated for 10 minutes with no loss in pulses. Transfer to the ICU for further evaluation.  STUDIES:  Colonoscopy 11/10 > rectal stercoral ulcer likely the source of bleeding Colonoscopy 11/10 > large amount of clots in rectum with bleeding from distal rectum close to anal verge, unsuccessful bipolar cautery with subsequent hemostatic clipping x 2  SIGNIFICANT EVENTS: 10/24. Admitted with Lt ACA stroke >> discharged on 11/1 11/8. Readmitted with hematochezia 11/10. Colonoscopy showed larger stercoral ulcer. Later had large clots/blood from rectum  Code blue called for unresponsiveness/agonal breathing with 10 seconds CPR. Repeat colonoscopy with cautery and clipping.   SUBJECTIVE: Expressive aphasia. No BM or blood per rectum over night.   VITAL SIGNS: Temp:  [97.1 F (36.2 C)-98 F (36.7 C)] 97.9 F (36.6 C) (11/11 0312) Pulse Rate:  [69-129] 73 (11/11 0600) Resp:  [9-21] 11 (11/11 0600) BP: (81-133)/(30-87) 93/62 mmHg (11/11 0600) SpO2:  [94 %-100 %] 100 % (11/11 0600) Weight:  [151 lb 3.8 oz (68.6 kg)] 151 lb 3.8 oz (68.6 kg) (11/11 0452) HEMODYNAMICS:   VENTILATOR SETTINGS:   INTAKE / OUTPUT:  Intake/Output Summary (Last 24 hours) at 05/16/15 0751 Last data filed at 05/16/15 0600  Gross per 24 hour  Intake   2060 ml  Output    400 ml  Net   1660 ml    PHYSICAL EXAMINATION: General:  Awake but intermittently falls asleep HEENT:  Moist mucus membranes. Pupils constricted and slightly reactive to  light.  Cardiovascular:  RRR, No m/r/g noted. No pitting edema Lungs:  No increased WOB. Clear anteriorly, no wheeze or crackles Abdomen:  +BS, soft, non-distended, non-tender. No blood per rectum noted.  Skin:  Intact  LABS:  CBC  Recent Labs Lab 05/15/15 1630 05/15/15 1852 05/15/15 2149 05/16/15 0445  WBC 16.5* 14.2*  --  PENDING  HGB 9.8* 9.5* 9.8* 8.2*  HCT 30.3* 29.1* 29.9* 25.3*  PLT 251 214  --  208   Coag's  Recent Labs Lab 05/13/15 1259  INR 1.76*   BMET  Recent Labs Lab 05/15/15 1630 05/15/15 1852 05/16/15 0445  NA 138 138 141  K 3.7 4.1 3.5  CL 108 109 112*  CO2 21* 24 24  BUN 13 11 8   CREATININE 0.88 0.91 0.72  GLUCOSE 212* 216* 145*   Electrolytes  Recent Labs Lab 05/15/15 1630 05/15/15 1852 05/16/15 0445  CALCIUM 8.0* 7.8* 7.9*  MG  --   --  1.7  PHOS  --   --  2.4*   Sepsis Markers  Recent Labs Lab 05/15/15 1852 05/15/15 2230 05/15/15 2236 05/16/15 0040  LATICACIDVEN 2.2*  --  1.9 1.7  PROCALCITON  --  <0.10  --   --    ABG  Recent Labs Lab 05/15/15 1551 05/16/15 0319  PHART 7.398 7.415  PCO2ART 30.1* 32.9*  PO2ART 442* 118*   Liver Enzymes  Recent Labs Lab 05/13/15 1259  AST 33  ALT 33  ALKPHOS 61  BILITOT 0.6  ALBUMIN 3.6   Cardiac Enzymes  Recent Labs Lab  05/15/15 1630  TROPONINI <0.03   Glucose  Recent Labs Lab 05/15/15 0758 05/15/15 0935 05/15/15 1213 05/15/15 1547 05/15/15 1629 05/15/15 2231  GLUCAP 192* 166* 118* 225* 178* 174*    Imaging Dg Chest Port 1 View  05/16/2015  CLINICAL DATA:  Acute respiratory failure EXAM: PORTABLE CHEST 1 VIEW COMPARISON:  Yesterday FINDINGS: Chronic mild cardiomegaly with aortic toward, currently accentuated by rightward rotation. Mild hypoventilation. There is no edema, consolidation, effusion, or pneumothorax. IMPRESSION: Stable exam.  No evidence of acute cardiopulmonary disease. Electronically Signed   By: Monte Fantasia M.D.   On: 05/16/2015 06:02    Dg Chest Port 1 View  05/15/2015  CLINICAL DATA:  Full code EXAM: PORTABLE CHEST 1 VIEW COMPARISON:  05/01/2015 chest radiograph. FINDINGS: Defibrillation pad overlies the left chest. Stable cardiomediastinal silhouette with normal heart size. No pneumothorax. No pleural effusion. Clear lungs, with no focal lung consolidation and no pulmonary edema. No displaced fracture. IMPRESSION: No active disease. Electronically Signed   By: Ilona Sorrel M.D.   On: 05/15/2015 16:10     ASSESSMENT / PLAN:  PULMONARY OETT A: Stable P:   Wean down O2 as tolerated  CARDIOVASCULAR CVL A: H/O CAD Negative troponins Mild ST depression in V4 P:  Appears hemodynamically stable. Has good peripheral access.  May need CVL if she continues to bleed.  RENAL A:  Stable P:   Monitor urine output  GASTROINTESTINAL A:  Acute lower GI bleed. Bleeding rectal ulcer. P:   Monitor H/H q6 hr Goal Hb >8 Repeat flex sig Surgery following.  Will need formal SLP prior to diet order  HEMATOLOGIC A:  Acute blood loss anemia P:  Follow H/H Transfuse as needed.  INFECTIOUS A:  UTI Lactate initially high at 2.2 but normalized Normal baseline procalcitonin.  P:   BCx2 >> UC >>  Ceftriaxione 11/10>>  NEUROLOGIC A:  Recent ACA stroke Residual right sided weakness and expressive aphasia.  P:   Monitor neuro status  FAMILY  - Updates: None at bedside - Inter-disciplinary family meet or Palliative Care meeting due by:  11/17  Archie Patten, MD [facility] Resident  05/16/2015, 7:51 AM

## 2015-05-16 NOTE — Progress Notes (Signed)
Pt has bed on 2C. Report called to RN. Pt to be transferred via bed with belongings. Both of the pts son have been called and made aware of transfer.

## 2015-05-16 NOTE — Progress Notes (Signed)
Subjective:  Patient denies any abdominal pain.  No further GI bleed.  Objective:  Vital Signs in the last 24 hours: Temp:  [97.1 F (36.2 C)-98 F (36.7 C)] 97.6 F (36.4 C) (11/11 0821) Pulse Rate:  [61-129] 79 (11/11 1000) Resp:  [8-21] 15 (11/11 1000) BP: (81-133)/(30-87) 104/65 mmHg (11/11 1000) SpO2:  [95 %-100 %] 100 % (11/11 1000) Weight:  [68.6 kg (151 lb 3.8 oz)] 68.6 kg (151 lb 3.8 oz) (11/11 0452)  Intake/Output from previous day: 11/10 0701 - 11/11 0700 In: 2135 [P.O.:360; I.V.:1725; IV Piggyback:50] Out: 400 [Urine:400] Intake/Output from this shift: Total I/O In: 225 [I.V.:225] Out: -   Physical Exam: Neck: no adenopathy, no carotid bruit, no JVD and supple, symmetrical, trachea midline Lungs: clear to auscultation anterolaterally Heart: regular rate and rhythm, S1, S2 normal and soft systolic murmur noted Abdomen: soft, non-tender; bowel sounds normal; no masses,  no organomegaly Extremities: extremities normal, atraumatic, no cyanosis or edema Neuro right-sided paralysis noted Lab Results:  Recent Labs  05/15/15 1852 05/15/15 2149 05/16/15 0445  WBC 14.2*  --  10.8*  HGB 9.5* 9.8* 8.2*  PLT 214  --  208    Recent Labs  05/15/15 1852 05/16/15 0445  NA 138 141  K 4.1 3.5  CL 109 112*  CO2 24 24  GLUCOSE 216* 145*  BUN 11 8  CREATININE 0.91 0.72    Recent Labs  05/15/15 1630  TROPONINI <0.03   Hepatic Function Panel  Recent Labs  05/13/15 1259  PROT 6.1*  ALBUMIN 3.6  AST 33  ALT 33  ALKPHOS 61  BILITOT 0.6   No results for input(s): CHOL in the last 72 hours. No results for input(s): PROTIME in the last 72 hours.  Imaging: Imaging results have been reviewed and Dg Chest Port 1 View  05/16/2015  CLINICAL DATA:  Acute respiratory failure EXAM: PORTABLE CHEST 1 VIEW COMPARISON:  Yesterday FINDINGS: Chronic mild cardiomegaly with aortic toward, currently accentuated by rightward rotation. Mild hypoventilation. There is no  edema, consolidation, effusion, or pneumothorax. IMPRESSION: Stable exam.  No evidence of acute cardiopulmonary disease. Electronically Signed   By: Monte Fantasia M.D.   On: 05/16/2015 06:02   Dg Chest Port 1 View  05/15/2015  CLINICAL DATA:  Full code EXAM: PORTABLE CHEST 1 VIEW COMPARISON:  05/01/2015 chest radiograph. FINDINGS: Defibrillation pad overlies the left chest. Stable cardiomediastinal silhouette with normal heart size. No pneumothorax. No pleural effusion. Clear lungs, with no focal lung consolidation and no pulmonary edema. No displaced fracture. IMPRESSION: No active disease. Electronically Signed   By: Ilona Sorrel M.D.   On: 05/15/2015 16:10    Cardiac Studies:  Assessment/Plan:  Recurrent lower GI bleed Status post hypotensive shock Recent left CVA with right paresis Mild CAD in the past Hypertension Diabetes mellitus Compensated CHF secondary to preserved LV systolic function PFO History of tobacco abuse in the past Hyperlipidemia Plan Continue present management. Follow serial H&H  LOS: 3 days    Charolette Forward 05/16/2015, 10:52 AM

## 2015-05-16 NOTE — Progress Notes (Signed)
[facility] Progress Note Patient Name: [name] DOB: 1945-09-06 MRN: [id number]   Date of Service  05/16/2015  HPI/Events of Note  Mild pain, belly, sternum from CPR yesterday  eICU Interventions  Prn fentanyl, low dose     Intervention Category Intermediate Interventions: Pain - evaluation and management  Simonne Maffucci 05/16/2015, 5:45 PM

## 2015-05-16 NOTE — Progress Notes (Signed)
     [facility] Progress Note  Subjective:  Hgb 8.2.  S/P colonoscopy 05/15/15: ENDOSCOPIC IMPRESSION: Poor quality prep Rectal stercoral ulcer likely source of bleeding RECOMMENDATIONS: Ok to restart start plavix and coumadin tomorrow Daily bowel regimen with Miralax twice daily to prevent constipation Canasa suppository at bedtime for 1 week Daily Benefiber When opt returned to floor, she had 2 large bloody BMs.Felt dizzy, became unresponsive,  rapid response was called. Pt was  Transferred to ICU.pt had a flex sig at bedside:ENDOSCOPIC IMPRESSION: Large amount of clots noted in the rectum, which were washed and suctioned. Active oozing of blood noted in the distal rectum close to the anal verge, did not achieve hemostasis with bipolar cautery and placed 2 hemostatic clips with good hemostasis RECOMMENDATIONS: Monitor Hgb and transfuse as needed to maintain Hgb >8 Surgery consult if has recurrent bleed for possible packing of the rectum/surgical intervention  She was seen by surgery who flet BRBPR due to rectal ulcer and anticoag/antiplatelet therapy.  Objective:  Vital signs in last 24 hours: Temp:  [97.1 F (36.2 C)-98 F (36.7 C)] 97.6 F (36.4 C) (11/11 0821) Pulse Rate:  [69-129] 73 (11/11 0600) Resp:  [9-21] 11 (11/11 0600) BP: (81-133)/(30-87) 93/62 mmHg (11/11 0600) SpO2:  [94 %-100 %] 100 % (11/11 0600) Weight:  [151 lb 3.8 oz (68.6 kg)] 151 lb 3.8 oz (68.6 kg) (11/11 0452) Last BM Date: 05/14/15 General:   Alert, awake Heart:  Regular rate and rhythm; no murmurs Pulm;clear anteriorly Abdomen:  Soft, nontender and nondistended. Normal bowel sounds, without guarding, and without rebound.   Extremities:  Without edema. .  Intake/Output from previous day: 11/10 0701 - 11/11 0700 In: 2060 [P.O.:360; I.V.:1650; IV Piggyback:50] Out: 400 [Urine:400] Intake/Output this shift:    Lab Results:  Recent Labs  05/15/15 1630 05/15/15 1852  05/15/15 2149 05/16/15 0445  WBC 16.5* 14.2*  --  10.8*  HGB 9.8* 9.5* 9.8* 8.2*  HCT 30.3* 29.1* 29.9* 25.3*  PLT 251 214  --  208     ASSESSMENT/PLAN:   69 yo female s/p stroke in 04/2015,was on anticoag and antiplatelet therapy. Developed rectal bleeding, had colonoscopy that revealed recta lulcer. Had bleeding post procedure and had flex sig with 2 endoclips placed on bleeding ulcer.No further bleeding. Trend Hgb. If bleeding recurs, will likely need surgery for rectal packing for hemostasis.     LOS: 3 days   Hvozdovic, Vita Barley PA-C 05/16/2015, Pager [phone] Mon-Fri 8a-5p [phone] after 5p, weekends, holidays  [facility] Attending  I have also seen and assessed the patient and agree with the advanced practitioner's assessment and plan. No more bleeding  Please call back if ? Needs bowel regimen to avoid strercoral ulcer again  Gatha Mayer, MD, [facility] [phone] (pager) 05/16/2015 4:16 PM

## 2015-05-16 NOTE — Progress Notes (Signed)
Patient ID: [name], female   DOB: 08-04-1945, 69 y.o.   MRN: [id number] 1 Day Post-Op  Subjective: Pt c/o pain at her IV site, otherwise ok.  No further bleeding since last night  Objective: Vital signs in last 24 hours: Temp:  [97.1 F (36.2 C)-98 F (36.7 C)] 97.6 F (36.4 C) (11/11 0821) Pulse Rate:  [61-129] 87 (11/11 0845) Resp:  [8-21] 18 (11/11 0845) BP: (81-133)/(30-87) 126/64 mmHg (11/11 0845) SpO2:  [94 %-100 %] 100 % (11/11 0845) Weight:  [68.6 kg (151 lb 3.8 oz)] 68.6 kg (151 lb 3.8 oz) (11/11 0452) Last BM Date: 05/14/15  Intake/Output from previous day: 11/10 0701 - 11/11 0700 In: 2135 [P.O.:360; I.V.:1725; IV Piggyback:50] Out: 400 [Urine:400] Intake/Output this shift: Total I/O In: 75 [I.V.:75] Out: -   PE: Abd: soft, NT, ND, +BS Heart: regular  Lab Results:   Recent Labs  05/15/15 1852 05/15/15 2149 05/16/15 0445  WBC 14.2*  --  10.8*  HGB 9.5* 9.8* 8.2*  HCT 29.1* 29.9* 25.3*  PLT 214  --  208   BMET  Recent Labs  05/15/15 1852 05/16/15 0445  NA 138 141  K 4.1 3.5  CL 109 112*  CO2 24 24  GLUCOSE 216* 145*  BUN 11 8  CREATININE 0.91 0.72  CALCIUM 7.8* 7.9*   PT/INR  Recent Labs  05/13/15 1259  LABPROT 20.5*  INR 1.76*   CMP     Component Value Date/Time   NA 141 05/16/2015 0445   K 3.5 05/16/2015 0445   CL 112* 05/16/2015 0445   CO2 24 05/16/2015 0445   GLUCOSE 145* 05/16/2015 0445   BUN 8 05/16/2015 0445   CREATININE 0.72 05/16/2015 0445   CALCIUM 7.9* 05/16/2015 0445   PROT 6.1* 05/13/2015 1259   ALBUMIN 3.6 05/13/2015 1259   AST 33 05/13/2015 1259   ALT 33 05/13/2015 1259   ALKPHOS 61 05/13/2015 1259   BILITOT 0.6 05/13/2015 1259   GFRNONAA >60 05/16/2015 0445   GFRAA >60 05/16/2015 0445   Lipase     Component Value Date/Time   LIPASE 30 02/14/2010 1330       Studies/Results: Dg Chest Port 1 View  05/16/2015  CLINICAL DATA:  Acute respiratory failure EXAM: PORTABLE CHEST 1 VIEW COMPARISON:   Yesterday FINDINGS: Chronic mild cardiomegaly with aortic toward, currently accentuated by rightward rotation. Mild hypoventilation. There is no edema, consolidation, effusion, or pneumothorax. IMPRESSION: Stable exam.  No evidence of acute cardiopulmonary disease. Electronically Signed   By: Monte Fantasia M.D.   On: 05/16/2015 06:02   Dg Chest Port 1 View  05/15/2015  CLINICAL DATA:  Full code EXAM: PORTABLE CHEST 1 VIEW COMPARISON:  05/01/2015 chest radiograph. FINDINGS: Defibrillation pad overlies the left chest. Stable cardiomediastinal silhouette with normal heart size. No pneumothorax. No pleural effusion. Clear lungs, with no focal lung consolidation and no pulmonary edema. No displaced fracture. IMPRESSION: No active disease. Electronically Signed   By: Ilona Sorrel M.D.   On: 05/15/2015 16:10    Anti-infectives: Anti-infectives    Start     Dose/Rate Route Frequency Ordered Stop   05/15/15 1730  cefTRIAXone (ROCEPHIN) 1 g in dextrose 5 % 50 mL IVPB     1 g 100 mL/hr over 30 Minutes Intravenous Every 24 hours 05/15/15 1604         Assessment/Plan  1. LGI bleed secondary to rectal ulcer -coumadin and plavix on hold -no further rectal bleeding.  If rebleeds would try a  piece of gel foam to see if this would stop bleeding before any type of surgical intervention.  Will follow.   LOS: 3 days    Hilding Quintanar E 05/16/2015, 9:59 AM Pager: [phone]

## 2015-05-17 LAB — GLUCOSE, CAPILLARY
GLUCOSE-CAPILLARY: 71 mg/dL (ref 65–99)
GLUCOSE-CAPILLARY: 69 mg/dL (ref 65–99)
Glucose-Capillary: 113 mg/dL — ABNORMAL HIGH (ref 65–99)
Glucose-Capillary: 172 mg/dL — ABNORMAL HIGH (ref 65–99)
Glucose-Capillary: 189 mg/dL — ABNORMAL HIGH (ref 65–99)

## 2015-05-17 LAB — BASIC METABOLIC PANEL
ANION GAP: 7 (ref 5–15)
CHLORIDE: 113 mmol/L — AB (ref 101–111)
CO2: 22 mmol/L (ref 22–32)
Calcium: 8.2 mg/dL — ABNORMAL LOW (ref 8.9–10.3)
Creatinine, Ser: 0.72 mg/dL (ref 0.44–1.00)
GFR calc Af Amer: >60 mL/min (ref >60)
GFR calc non Af Amer: >60 mL/min (ref >60)
GLUCOSE: 96 mg/dL (ref 65–99)
POTASSIUM: 3 mmol/L — AB (ref 3.5–5.1)
Sodium: 142 mmol/L (ref 135–145)

## 2015-05-17 LAB — HEMOGLOBIN AND HEMATOCRIT, BLOOD
HCT: 28.1 % — ABNORMAL LOW (ref 36.0–46.0)
HEMATOCRIT: 26.8 % — AB (ref 36.0–46.0)
HEMATOCRIT: 27.8 % — AB (ref 36.0–46.0)
HEMOGLOBIN: 9.1 g/dL — AB (ref 12.0–15.0)
Hemoglobin: 9.1 g/dL — ABNORMAL LOW (ref 12.0–15.0)
Hemoglobin: 8.7 g/dL — ABNORMAL LOW (ref 12.0–15.0)

## 2015-05-17 LAB — CBC
HEMATOCRIT: 27.5 % — AB (ref 36.0–46.0)
Hemoglobin: 9 g/dL — ABNORMAL LOW (ref 12.0–15.0)
MCH: 28.3 pg (ref 26.0–34.0)
MCHC: 32.7 g/dL (ref 30.0–36.0)
MCV: 86.5 fL (ref 78.0–100.0)
Platelets: 217 10*3/uL (ref 150–400)
RBC: 3.18 MIL/uL — ABNORMAL LOW (ref 3.87–5.11)
RDW: 13 % (ref 11.5–15.5)
WBC: 9.1 10*3/uL (ref 4.0–10.5)

## 2015-05-17 LAB — PROTIME-INR
INR: 2.33 — ABNORMAL HIGH (ref 0.00–1.49)
Prothrombin Time: 25.3 seconds — ABNORMAL HIGH (ref 11.6–15.2)

## 2015-05-17 LAB — PROCALCITONIN: Procalcitonin: <0.1 ng/mL

## 2015-05-17 LAB — PREPARE RBC (CROSSMATCH)

## 2015-05-17 MED ORDER — CETYLPYRIDINIUM CHLORIDE 0.05 % MT LIQD
7.0000 mL | Freq: Two times a day (BID) | OROMUCOSAL | Status: DC
  Administered 2015-05-18 – 2015-05-21 (×7): 7 mL via OROMUCOSAL

## 2015-05-17 MED ORDER — POLYETHYLENE GLYCOL 3350 17 G PO PACK
17.0000 g | PACK | Freq: Two times a day (BID) | ORAL | Status: DC | PRN
  Administered 2015-05-18: 17 g via ORAL
  Filled 2015-05-17: qty 1

## 2015-05-17 MED ORDER — DEXTROSE 50 % IV SOLN
1.0000 | Freq: Once | INTRAVENOUS | Status: AC
Start: 2015-05-17 — End: 2015-05-17
  Administered 2015-05-17: 50 mL via INTRAVENOUS
  Filled 2015-05-17: qty 50

## 2015-05-17 MED ORDER — INSULIN GLARGINE 100 UNIT/ML ~~LOC~~ SOLN
5.0000 [IU] | Freq: Every day | SUBCUTANEOUS | Status: DC
  Administered 2015-05-17 – 2015-05-20 (×4): 5 [IU] via SUBCUTANEOUS
  Filled 2015-05-17 (×5): qty 0.05

## 2015-05-17 MED ORDER — CHLORHEXIDINE GLUCONATE 0.12 % MT SOLN
15.0000 mL | Freq: Two times a day (BID) | OROMUCOSAL | Status: DC
  Administered 2015-05-17 – 2015-05-21 (×8): 15 mL via OROMUCOSAL
  Filled 2015-05-17 (×7): qty 15

## 2015-05-17 MED ORDER — CETYLPYRIDINIUM CHLORIDE 0.05 % MT LIQD: 7.0000 mL | Freq: Two times a day (BID) | OROMUCOSAL | Status: DC

## 2015-05-17 MED ORDER — POTASSIUM CHLORIDE CRYS ER 20 MEQ PO TBCR
40.0000 meq | EXTENDED_RELEASE_TABLET | Freq: Once | ORAL | Status: AC
  Administered 2015-05-18: 40 meq via ORAL
  Filled 2015-05-17 (×2): qty 2

## 2015-05-17 NOTE — Evaluation (Signed)
Clinical/Bedside Swallow Evaluation Patient Details  Name: [name] MRN: [id number] Date of Birth: 1945/12/12  Today's Date: 05/17/2015 Time: SLP Start Time (ACUTE ONLY): [id number] SLP Stop Time (ACUTE ONLY): 1349 SLP Time Calculation (min) (ACUTE ONLY): 21 min  Past Medical History:  Past Medical History  Diagnosis Date  . CHF (congestive heart failure) (HCC)     EF 99991111, grade 1 diastolic dysfunction per echo 04/2015  . Diabetes ([place]) 2016    Type II. On insulin  . Hypertension   . CVA (cerebral infarction) 04/2015    Started Plavix 04/2015  . Patent foramen ovale 04/2015    Started on warfarin 04/2015  . Enchondroma of bone 2011    left femur.   . Stercoral ulcer of rectum 05/16/2015   Past Surgical History:  Past Surgical History  Procedure Laterality Date  . Cardiac surgery      Cath without stent  . Tee without cardioversion N/A 05/05/2015    Procedure: TRANSESOPHAGEAL ECHOCARDIOGRAM (TEE);  Surgeon: Dixie Dials, MD;  Location: [facility] ENDOSCOPY;  Service: Cardiovascular;  Laterality: N/A;  . Colonoscopy N/A 05/15/2015    Procedure: COLONOSCOPY;  Surgeon: Mauri Pole, MD;  Location: [facility] ENDOSCOPY;  Service: Endoscopy;  Laterality: N/A;  . Flexible sigmoidoscopy N/A 05/15/2015    Procedure: FLEXIBLE SIGMOIDOSCOPY;  Surgeon: Mauri Pole, MD;  Location: [facility] ENDOSCOPY;  Service: Endoscopy;  Laterality: N/A;  at bedside   HPI:  [name] is a 69 y.o. female with a past medical history that includes CAD, diabetes, left CVA with right hemiparesis 7 days ago presents to the emergency department from [facility] with the chief complaint of bright red blood per rectum. Pt had colonoscopy that revealed recta lulcer. On  05-15-15 pt had hypotensive shock with an unresponsive episode and CPR was performed.   Assessment / Plan / Recommendation Clinical Impression  Pts swallow appearing functional this date without overt signs or symptoms of reduced airway  protection. Pt remains limited by edentulous oral cavity affecting adequate mastication of solid PO. Pt with prolonged mastication of solids. Recommend conservative diet implementation of dysphagia 2 (chopped) and thin liquids. SLP to follow up for upgraded PO trials and diet advancement as tolerated. Family reports pt previously took medicines whole with puree. Full supervision and feeding assistance recommneded as right hemiparesis from recent CVA limits independent self feeding.     Aspiration Risk  Mild aspiration risk    Diet Recommendation     Liquid Administration via: Straw;Cup Medication Administration: Whole meds with puree Supervision: Staff to assist with self feeding;Full supervision/cueing for compensatory strategies Compensations: Minimize environmental distractions;Slow rate;Small sips/bites;Follow solids with liquid    Other  Recommendations Oral Care Recommendations: Oral care BID   Follow up Recommendations       Frequency and Duration min 1 x/week  1 week       Swallow Study   General Date of Onset: 05/13/15 HPI: [name] is a 69 y.o. female with a past medical history that includes CAD, diabetes, left CVA with right hemiparesis 7 days ago presents to the emergency department from [facility] with the chief complaint of bright red blood per rectum. Pt had colonoscopy that revealed recta lulcer. On  05-15-15 pt had hypotensive shock with an unresponsive episode and CPR was performed. Type of Study: Bedside Swallow Evaluation Diet Prior to this Study: Thin liquids Temperature Spikes Noted: No Respiratory Status: Room air History of Recent Intubation: No Behavior/Cognition: Alert Oral Cavity Assessment: Dry  Oral Care Completed by SLP: Recent completion by staff Oral Cavity - Dentition: Edentulous Self-Feeding Abilities: Needs set up (secondary to right hemiparesis) Patient Positioning: Upright in bed Baseline Vocal Quality: Other (comment);Low vocal  intensity (minimal expressive output 2/2 aphasia ) Volitional Cough: Cognitively unable to elicit    Oral/Motor/Sensory Function Overall Oral Motor/Sensory Function: Within functional limits   Ice Chips Ice chips: Not tested   Thin Liquid Thin Liquid: Within functional limits Presentation: Straw;Cup    Nectar Thick Nectar Thick Liquid: Not tested   Honey Thick Honey Thick Liquid: Not tested   Puree Puree: Within functional limits Presentation: Self Fed;Spoon   Solid Solid: Impaired Presentation: Self Fed Oral Phase Impairments: Impaired mastication Oral Phase Functional Implications: Prolonged oral transit      Arvil Chaco MA, CCC-SLP Acute Care Speech Language Pathologist    Arvil Chaco E 05/17/2015,2:06 PM

## 2015-05-17 NOTE — Progress Notes (Signed)
Subjective:  Patient denies any complaints more awake and alert. No further GI bleed. Hemoglobin appropriately increased after one unit of packed RBCs   Objective:  Vital Signs in the last 24 hours: Temp:  [97.4 F (36.3 C)-98.7 F (37.1 C)] 98.7 F (37.1 C) (11/12 0807) Pulse Rate:  [58-99] 92 (11/12 0807) Resp:  [10-26] 15 (11/12 0807) BP: (102-136)/(63-86) 136/77 mmHg (11/12 0807) SpO2:  [99 %-100 %] 100 % (11/12 0807) Weight:  [69 kg (152 lb 1.9 oz)-71.6 kg (157 lb 13.6 oz)] 71.6 kg (157 lb 13.6 oz) (11/12 0500)  Intake/Output from previous day: 11/11 0701 - 11/12 0700 In: 1700 [I.V.:1650; IV Piggyback:50] Out: 1250 [Urine:1250] Intake/Output from this shift:    Physical Exam: Neck: no adenopathy, no carotid bruit, no JVD and supple, symmetrical, trachea midline Lungs: clear to auscultation bilaterally Heart: regular rate and rhythm, S1, S2 normal and Soft systolic murmur noted Abdomen: soft, non-tender; bowel sounds normal; no masses,  no organomegaly Extremities: extremities normal, atraumatic, no cyanosis or edema  Lab Results:  Recent Labs  05/16/15 0445  05/16/15 2159 05/17/15 0550  WBC 10.8*  --   --  9.1  HGB 8.2*  < > 7.7* 9.0*  PLT 208  --   --  217  < > = values in this interval not displayed.  Recent Labs  05/16/15 0445 05/17/15 0550  NA 141 142  K 3.5 3.0*  CL 112* 113*  CO2 24 22  GLUCOSE 145* 96  BUN 8 <5*  CREATININE 0.72 0.72    Recent Labs  05/15/15 1630  TROPONINI <0.03   Hepatic Function Panel No results for input(s): PROT, ALBUMIN, AST, ALT, ALKPHOS, BILITOT, BILIDIR, IBILI in the last 72 hours. No results for input(s): CHOL in the last 72 hours. No results for input(s): PROTIME in the last 72 hours.  Imaging: Imaging results have been reviewed and Dg Chest Port 1 View  05/16/2015  CLINICAL DATA:  Acute respiratory failure EXAM: PORTABLE CHEST 1 VIEW COMPARISON:  Yesterday FINDINGS: Chronic mild cardiomegaly with aortic  toward, currently accentuated by rightward rotation. Mild hypoventilation. There is no edema, consolidation, effusion, or pneumothorax. IMPRESSION: Stable exam.  No evidence of acute cardiopulmonary disease. Electronically Signed   By: Monte Fantasia M.D.   On: 05/16/2015 06:02   Dg Chest Port 1 View  05/15/2015  CLINICAL DATA:  Full code EXAM: PORTABLE CHEST 1 VIEW COMPARISON:  05/01/2015 chest radiograph. FINDINGS: Defibrillation pad overlies the left chest. Stable cardiomediastinal silhouette with normal heart size. No pneumothorax. No pleural effusion. Clear lungs, with no focal lung consolidation and no pulmonary edema. No displaced fracture. IMPRESSION: No active disease. Electronically Signed   By: Ilona Sorrel M.D.   On: 05/15/2015 16:10    Cardiac Studies:  Assessment/Plan:  Status post Recurrent lower GI bleed Status post hypotensive shock Recent left CVA with right paresis Mild CAD in the past Hypertension Diabetes mellitus Compensated CHF secondary to preserved LV systolic function PFO History of tobacco abuse in the past Hyperlipidemia Escherichia coli UTI Plan Continue present management Check urine final cultures Continue IV Rocephin Check serial H&H Advance diet as per GI   LOS: 4 days    Charolette Forward 05/17/2015, 11:29 AM

## 2015-05-17 NOTE — Anesthesia Postprocedure Evaluation (Signed)
  Anesthesia Post-op Note  Patient: [name]  Procedure(s) Performed: Procedure(s) (LRB): COLONOSCOPY (N/A)  Patient Location: PACU  Anesthesia Type: MAC  Level of Consciousness: awake and alert   Airway and Oxygen Therapy: Patient Spontanous Breathing  Post-op Pain: mild  Post-op Assessment: Post-op Vital signs reviewed, Patient's Cardiovascular Status Stable, Respiratory Function Stable, Patent Airway and No signs of Nausea or vomiting  Last Vitals:  05/15/15  BP:  111/66   Resp:  13    Post-op Vital Signs: stable   Complications: No apparent anesthesia complications

## 2015-05-17 NOTE — Progress Notes (Signed)
Scanned patients bladder with 600cc noted on machine.in and out cathed patient with 700cc clear yellow urine noted on return. Patient tolerated procedure well.    Byrdie Miyazaki  RN

## 2015-05-17 NOTE — Clinical Social Work Note (Signed)
Patient transferred to [facility]. Patient to return to [facility] once medically stable.   Clinical Social Worker will continue to follow patient and pt's family for continued support and to facilitate pt's discharge needs once medically stable.   Glendon Axe, MSW, LCSWA [phone] 05/17/2015 3:57 PM

## 2015-05-18 LAB — BASIC METABOLIC PANEL
Anion gap: 7 (ref 5–15)
BUN: <5 mg/dL — ABNORMAL LOW (ref 6–20)
CO2: 23 mmol/L (ref 22–32)
CREATININE: 0.78 mg/dL (ref 0.44–1.00)
Calcium: 8.2 mg/dL — ABNORMAL LOW (ref 8.9–10.3)
Chloride: 111 mmol/L (ref 101–111)
GFR calc non Af Amer: >60 mL/min (ref >60)
Glucose, Bld: 129 mg/dL — ABNORMAL HIGH (ref 65–99)
Potassium: 3.2 mmol/L — ABNORMAL LOW (ref 3.5–5.1)
SODIUM: 141 mmol/L (ref 135–145)

## 2015-05-18 LAB — GLUCOSE, CAPILLARY
GLUCOSE-CAPILLARY: 106 mg/dL — AB (ref 65–99)
GLUCOSE-CAPILLARY: 158 mg/dL — AB (ref 65–99)
GLUCOSE-CAPILLARY: 189 mg/dL — AB (ref 65–99)
Glucose-Capillary: 107 mg/dL — ABNORMAL HIGH (ref 65–99)
Glucose-Capillary: 119 mg/dL — ABNORMAL HIGH (ref 65–99)

## 2015-05-18 LAB — HEMOGLOBIN AND HEMATOCRIT, BLOOD
HCT: 28.4 % — ABNORMAL LOW (ref 36.0–46.0)
Hemoglobin: 9.2 g/dL — ABNORMAL LOW (ref 12.0–15.0)

## 2015-05-18 LAB — URINE CULTURE: Culture: >=100000

## 2015-05-18 LAB — CBC
HEMATOCRIT: 26.6 % — AB (ref 36.0–46.0)
Hemoglobin: 8.6 g/dL — ABNORMAL LOW (ref 12.0–15.0)
MCH: 27.9 pg (ref 26.0–34.0)
MCHC: 32.3 g/dL (ref 30.0–36.0)
MCV: 86.4 fL (ref 78.0–100.0)
PLATELETS: 219 10*3/uL (ref 150–400)
RBC: 3.08 MIL/uL — AB (ref 3.87–5.11)
RDW: 13.3 % (ref 11.5–15.5)
WBC: 7.7 10*3/uL (ref 4.0–10.5)

## 2015-05-18 LAB — TYPE AND SCREEN
ABO/RH(D): A POS
ANTIBODY SCREEN: NEGATIVE
UNIT DIVISION: 0

## 2015-05-18 MED ORDER — WARFARIN - PHARMACIST DOSING INPATIENT
Freq: Every day | Status: DC
  Administered 2015-05-18 – 2015-05-19 (×2)

## 2015-05-18 MED ORDER — WARFARIN SODIUM 3 MG PO TABS
3.5000 mg | ORAL_TABLET | Freq: Every day | ORAL | Status: DC
  Administered 2015-05-18 – 2015-05-20 (×3): 3.5 mg via ORAL
  Filled 2015-05-18 (×5): qty 1

## 2015-05-18 NOTE — Progress Notes (Signed)
Subjective:  Patient denies any chest pain or shortness of breath. No abdominal pain.. No further GI bleeding.  Objective:  Vital Signs in the last 24 hours: Temp:  [97.6 F (36.4 C)-98.8 F (37.1 C)] 97.6 F (36.4 C) (11/13 0731) Pulse Rate:  [58-98] 86 (11/13 0800) Resp:  [10-25] 25 (11/13 0800) BP: (104-145)/(59-110) 117/71 mmHg (11/13 0800) SpO2:  [98 %-100 %] 100 % (11/13 0800) Weight:  [71 kg (156 lb 8.4 oz)] 71 kg (156 lb 8.4 oz) (11/13 0500)  Intake/Output from previous day: 11/12 0701 - 11/13 0700 In: 1761.7 [P.O.:320; I.V.:1391.7; IV Piggyback:50] Out: 1600 [Urine:1600] Intake/Output from this shift: Total I/O In: 150 [I.V.:150] Out: -   Physical Exam: Neck: no adenopathy, no carotid bruit, no JVD and supple, symmetrical, trachea midline Lungs: clear to auscultation bilaterally Heart: regular rate and rhythm, S1, S2 normal and Soft systolic murmur noted Abdomen: soft, non-tender; bowel sounds normal; no masses,  no organomegaly Extremities: extremities normal, atraumatic, no cyanosis or edema  Lab Results:  Recent Labs  05/17/15 0550  05/18/15 0420 05/18/15 0955  WBC 9.1  --  7.7  --   HGB 9.0*  < > 8.6* 9.2*  PLT 217  --  219  --   < > = values in this interval not displayed.  Recent Labs  05/17/15 0550 05/18/15 0420  NA 142 141  K 3.0* 3.2*  CL 113* 111  CO2 22 23  GLUCOSE 96 129*  BUN <5* <5*  CREATININE 0.72 0.78    Recent Labs  05/15/15 1630  TROPONINI <0.03   Hepatic Function Panel No results for input(s): PROT, ALBUMIN, AST, ALT, ALKPHOS, BILITOT, BILIDIR, IBILI in the last 72 hours. No results for input(s): CHOL in the last 72 hours. No results for input(s): PROTIME in the last 72 hours.  Imaging: Imaging results have been reviewed and No results found.  Cardiac Studies:  Assessment/Plan:  Status post Recurrent lower GI bleed Status post hypotensive shock Recent left CVA with right paresis Mild CAD in the  past Hypertension Diabetes mellitus Compensated CHF secondary to preserved LV systolic function PFO History of tobacco abuse in the past Hyperlipidemia Escherichia coli UTI Plan Start Coumadin if okay with GI per pharmacy protocol Check CBC PT/INR in a.m.  LOS: 5 days    Charolette Forward 05/18/2015, 11:00 AM

## 2015-05-18 NOTE — Progress Notes (Signed)
ANTICOAGULATION CONSULT NOTE - Initial Consult  Pharmacy Consult for coumadin Indication: stroke w/ PFO  No Known Allergies  Patient Measurements: Height: 5\' 7"  (170.2 cm) Weight: 156 lb 8.4 oz (71 kg) IBW/kg (Calculated) : 61.6   Vital Signs: Temp: 97.6 F (36.4 C) (11/13 0731) Temp Source: Oral (11/13 0731) BP: 117/71 mmHg (11/13 0800) Pulse Rate: 86 (11/13 0800)  Labs:  Recent Labs  05/15/15 1630  05/16/15 0445  05/17/15 0550  05/17/15 2136 05/18/15 0420 05/18/15 0955  HGB 9.8*  < > 8.2*  < > 9.0*  < > 9.1* 8.6* 9.2*  HCT 30.3*  < > 25.3*  < > 27.5*  < > 27.8* 26.6* 28.4*  PLT 251  < > 208  --  217  --   --  219  --   LABPROT  --   --   --   --  25.3*  --   --   --   --   INR  --   --   --   --  2.33*  --   --   --   --   CREATININE 0.88  < > 0.72  --  0.72  --   --  0.78  --   TROPONINI <0.03  --   --   --   --   --   --   --   --   < > = values in this interval not displayed.  Estimated Creatinine Clearance: 63.6 mL/min (by C-G formula based on Cr of 0.78).   Medical History: Past Medical History  Diagnosis Date  . CHF (congestive heart failure) (HCC)     EF 99991111, grade 1 diastolic dysfunction per echo 04/2015  . Diabetes ([place]) 2016    Type II. On insulin  . Hypertension   . CVA (cerebral infarction) 04/2015    Started Plavix 04/2015  . Patent foramen ovale 04/2015    Started on warfarin 04/2015  . Enchondroma of bone 2011    left femur.   . Stercoral ulcer of rectum 05/16/2015    Medications:  Prescriptions prior to admission  Medication Sig Dispense Refill Last Dose  . acetaminophen (TYLENOL) 325 MG tablet Take 650 mg by mouth every 6 (six) hours as needed for mild pain.   Past Month at Unknown time  . amLODipine (NORVASC) 5 MG tablet Take 5 mg by mouth daily.   05/13/2015 at Unknown time  . atorvastatin (LIPITOR) 40 MG tablet Take 1 tablet (40 mg total) by mouth daily. 30 tablet 3 05/13/2015 at Unknown time  . clopidogrel (PLAVIX) 75 MG tablet  Take 1 tablet (75 mg total) by mouth daily. 30 tablet 3 05/13/2015 at Unknown time  . insulin glargine (LANTUS) 100 UNIT/ML injection Inject 0.2 mLs (20 Units total) into the skin at bedtime. 10 mL 11 05/12/2015 at Unknown time  . insulin regular (NOVOLIN R,HUMULIN R) 100 units/mL injection Inject 0-10 Units into the skin See admin instructions. Per sliding scale: BS 0-69 give 0 units, BS 70-150 give 0 units, BS 151-200 give 2 units, BS 201-250 give 4 units, BS 251-300 give 6 units, BS 301-350 give 8 units, BS 351-400 give 10 units, BS 401 or higher, call MD   05/13/2015 at Unknown time  . metoprolol succinate (TOPROL-XL) 50 MG 24 hr tablet Take 1 tablet (50 mg total) by mouth daily. 30 tablet 3 05/13/2015 at 0900  . miconazole (MICOTIN) 2 % cream Apply 1 application topically 2 (two) times  daily.   05/13/2015 at Unknown time  . nitroGLYCERIN (NITROSTAT) 0.4 MG SL tablet Place 0.4 mg under the tongue every 5 (five) minutes as needed for chest pain.   unk at [employer]  . pantoprazole (PROTONIX) 40 MG tablet Take 1 tablet (40 mg total) by mouth daily at 6 (six) AM. 30 tablet 3 05/13/2015 at Unknown time  . warfarin (COUMADIN) 3 MG tablet Take 1 tablet (3 mg total) by mouth daily. (Patient taking differently: Take 3.5 mg by mouth daily. ) 35 tablet 3 05/12/2015 at Unknown time    Assessment: 69 yo F on warfarin PTA, last dose 11/7.  On coumadin for recent stroke and PFO.  I d/w Dr. Terrence Dupont: pt does not have Afib, but does have PFO and is bed-bound with high risk for DVT and had recent stroke and he wants to resume coumadin.    PTA dose: 3.5 mg daily - last dose 11/7 Hg up to 9.2 s/p recurrent LGIB; INR 2.33 yesterday, no INR today Admitted with BRBPR, multiple bloody BM since admission   Colonoscopy 11/10: rectal stercoral ulcer + active bleeding in distal rectum. GI signed off 11/11; no further bleeding.  Goal of Therapy:  INR 2-3    Plan:  - resume previous dose of coumadin = 3.5 mg daily - daily INR - f/u  CBC, s/sx of bleeding  Eudelia Bunch, Pharm.D. [id number] 05/18/2015 1:33 PM

## 2015-05-18 NOTE — Progress Notes (Signed)
Due to patient requiring multiple in and out caths and after a bladder scan revealed 800 ml of urine at 0000 a Foley catheter was ordered. Foley was placed at [place] by Remo Lipps, RN and Andrena Mews, NT.  Peri care was performed before and after foley insertion. Pt handled the procedure well. RN will continue to monitor.

## 2015-05-18 NOTE — Progress Notes (Signed)
Bladder scan at 0000 showed 800 ml. MD notified, awaiting orders.

## 2015-05-19 LAB — PROTIME-INR
INR: 1.2 (ref 0.00–1.49)
Prothrombin Time: 15.4 seconds — ABNORMAL HIGH (ref 11.6–15.2)

## 2015-05-19 LAB — GLUCOSE, CAPILLARY
GLUCOSE-CAPILLARY: 164 mg/dL — AB (ref 65–99)
GLUCOSE-CAPILLARY: 141 mg/dL — AB (ref 65–99)
GLUCOSE-CAPILLARY: 139 mg/dL — AB (ref 65–99)
Glucose-Capillary: 177 mg/dL — ABNORMAL HIGH (ref 65–99)

## 2015-05-19 LAB — CBC
HEMATOCRIT: 26 % — AB (ref 36.0–46.0)
Hemoglobin: 8.5 g/dL — ABNORMAL LOW (ref 12.0–15.0)
MCH: 28.6 pg (ref 26.0–34.0)
MCHC: 32.7 g/dL (ref 30.0–36.0)
MCV: 87.5 fL (ref 78.0–100.0)
Platelets: 239 10*3/uL (ref 150–400)
RBC: 2.97 MIL/uL — AB (ref 3.87–5.11)
RDW: 13.4 % (ref 11.5–15.5)
WBC: 7.8 10*3/uL (ref 4.0–10.5)

## 2015-05-19 MED ORDER — CIPROFLOXACIN HCL 500 MG PO TABS
500.0000 mg | ORAL_TABLET | Freq: Two times a day (BID) | ORAL | Status: DC
  Administered 2015-05-19 – 2015-05-21 (×5): 500 mg via ORAL
  Filled 2015-05-19 (×7): qty 1

## 2015-05-19 MED ORDER — POLYETHYLENE GLYCOL 3350 17 G PO PACK
17.0000 g | PACK | Freq: Two times a day (BID) | ORAL | Status: DC
  Administered 2015-05-19 – 2015-05-21 (×5): 17 g via ORAL
  Filled 2015-05-19 (×5): qty 1

## 2015-05-19 NOTE — Progress Notes (Signed)
Speech Language Pathology Treatment: Dysphagia  Patient Details Name: [name] MRN: [id number] DOB: 06-03-1946 Today's Date: 05/19/2015 Time: 0922-0932 SLP Time Calculation (min) (ACUTE ONLY): 10 min  Assessment / Plan / Recommendation Clinical Impression  Mastication and transit with Dys 3 texture efficient and timely. Delayed cough x 1(3-4 minutes post po's) although may not have been related to food/liquid. Lung sounds clear per RN documentation, afebrile. No cueing needed. SLP upgraded solids to Dys 3, continue thin liquids and ST.   HPI HPI: [name] is a 69 y.o. female with a past medical history that includes CAD, diabetes, left CVA with right hemiparesis 7 days ago presents to the emergency department from [facility] with the chief complaint of bright red blood per rectum. Pt had colonoscopy that revealed recta lulcer. On  05-15-15 pt had hypotensive shock with an unresponsive episode and CPR was performed.      SLP Plan  Continue with current plan of care     Recommendations  Diet recommendations: Dysphagia 3 (mechanical soft);Thin liquid Liquids provided via: Straw;Cup Medication Administration: Whole meds with puree Supervision: Patient able to self feed;Full supervision/cueing for compensatory strategies;Staff to assist with self feeding Compensations: Small sips/bites;Slow rate;Follow solids with liquid Postural Changes and/or Swallow Maneuvers: Seated upright 90 degrees;Upright 30-60 min after meal              Oral Care Recommendations: Oral care BID Follow up Recommendations: 24 hour supervision/assistance;Skilled Nursing facility Plan: Continue with current plan of care   Houston Siren 05/19/2015, 9:46 AM   Orbie Pyo Colvin Caroli.Ed [employer] [phone]

## 2015-05-19 NOTE — Care Management Important Message (Signed)
Important Message  Patient Details  Name: [name] MRN: [id number] Date of Birth: 07/06/1945   Medicare Important Message Given:  Yes    Sakoya Win P [place] 05/19/2015, 1:24 PM

## 2015-05-19 NOTE — Progress Notes (Signed)
05/19/2015 patient had a incontinent void and stool at 1745 while in recliner. She was place back to bed and pericare was done. [facility] RN.

## 2015-05-19 NOTE — Progress Notes (Signed)
Subjective:  More awake today.  Denies any chest pain or shortness of breath.  No BM yet.  Objective:  Vital Signs in the last 24 hours: Temp:  [97.5 F (36.4 C)-99.7 F (37.6 C)] 98.1 F (36.7 C) (11/14 0802) Pulse Rate:  [65-92] 76 (11/14 0802) Resp:  [10-21] 13 (11/14 0802) BP: (93-143)/(56-93) 112/93 mmHg (11/14 0802) SpO2:  [98 %-100 %] 99 % (11/14 0802)  Intake/Output from previous day: 11/13 0701 - 11/14 0700 In: 990 [P.O.:240; I.V.:700; IV Piggyback:50] Out: [id number] [Urine:1475] Intake/Output from this shift:    Physical Exam: Neck: no adenopathy, no carotid bruit, no JVD and supple, symmetrical, trachea midline Lungs: clear to auscultation bilaterally Heart: regular rate and rhythm, S1, S2 normal and soft systolic murmur noted Abdomen: soft, non-tender; bowel sounds normal; no masses,  no organomegaly Extremities: extremities normal, atraumatic, no cyanosis or edema Neuro right-sided paralysis noted Lab Results:  Recent Labs  05/18/15 0420 05/18/15 0955 05/19/15 0544  WBC 7.7  --  7.8  HGB 8.6* 9.2* 8.5*  PLT 219  --  239    Recent Labs  05/17/15 0550 05/18/15 0420  NA 142 141  K 3.0* 3.2*  CL 113* 111  CO2 22 23  GLUCOSE 96 129*  BUN <5* <5*  CREATININE 0.72 0.78   No results for input(s): TROPONINI in the last 72 hours.  Invalid input(s): CK, MB Hepatic Function Panel No results for input(s): PROT, ALBUMIN, AST, ALT, ALKPHOS, BILITOT, BILIDIR, IBILI in the last 72 hours. No results for input(s): CHOL in the last 72 hours. No results for input(s): PROTIME in the last 72 hours.  Imaging: Imaging results have been reviewed and No results found.  Cardiac Studies:  Assessment/Plan:  Status post Recurrent lower GI bleed Status post hypotensive shock Recent left CVA with right paresis Mild CAD in the past Hypertension Diabetes mellitus Compensated CHF secondary to preserved LV systolic function PFO History of tobacco abuse in the  past Hyperlipidemia Escherichia coli UTI Plan Rx for constipation. DC IV fluid. DC IV Rocephin and switched to by mouth Cipro OT, PT consult. Check labs in a.m. Possible discharge tomorrow if stable  LOS: 6 days    Charolette Forward 05/19/2015, 9:34 AM

## 2015-05-19 NOTE — Evaluation (Signed)
Physical Therapy Evaluation Patient Details Name: [name] MRN: [id number] DOB: [birth year] Today's Date: 05/19/2015   History of Present Illness  [name] is a 69 y.o. female with a past medical history that includes CAD, diabetes, left CVA with right hemiparesis 7 days ago presents [facility] emergency department from [facility] with the chief complaint of bright red blood per rectum. Pt had colonoscopy that revealed rectal ulcer. On 05-15-15 pt had hypotensive shock with an unresponsive episode and CPR was performed.   Clinical Impression  Patient presents with decreased mobility due to deficits listed in PT problem list.  She will benefit from skilled PT in the acute setting to allow decreased burden of care at next venue of care.    Follow Up Recommendations SNF    Equipment Recommendations  Other (comment) (TBA)    Recommendations for Other Services       Precautions / Restrictions Precautions Precautions: Fall Precaution Comments: R Hemiparesis      Mobility  Bed Mobility               General bed mobility comments: NT pt in chair  Transfers Overall transfer level: Needs assistance   Transfers: Sit to/from Stand;Stand Pivot Transfers Sit to Stand: Max assist Stand pivot transfers: Max assist       General transfer comment: R knee blocked for sit to stand with chair back on pt's left side to hold while in standing for few seconds, pt with incontinent episode so attepmpted pivot to BSC on left side, but pt pushing so unable to pivot safely returned to sitting and placed 3:1 on right for pivot to right, then placed recliner on right for pivot to right again with assist for R LE stability   Ambulation/Gait                Stairs            Wheelchair Mobility    Modified Rankin (Stroke Patients Only)       Balance     Sitting balance-Leahy Scale: Poor Sitting balance - Comments: seated at edge of recliner and on 3:1 needed  min to mod support to prevent LOB to right side     Standing balance-Leahy Scale: Zero Standing balance comment: standing with L UE support and mod to max support for balance x about 30 seconds (limited by HR up to 140's)                             Pertinent Vitals/Pain Faces Pain Scale: Hurts little more Pain Location: R UE with movement Pain Descriptors / Indicators: Grimacing Pain Intervention(s): Limited activity within patient's tolerance;Monitored during session;Repositioned    Home Living Family/patient expects to be discharged to:: Skilled nursing facility                      Prior Function Level of Independence: Needs assistance   Gait / Transfers Assistance Needed: was ambulatory prior to recent CVA           Hand Dominance   Dominant Hand: Right    Extremity/Trunk Assessment   Upper Extremity Assessment: Defer to OT evaluation           Lower Extremity Assessment: RLE deficits/detail RLE Deficits / Details: could not elicit any antigravity movement, noted some functional strength with support in standing    Cervical / Trunk Assessment: Other exceptions  Communication  Communication: Expressive difficulties  Cognition Arousal/Alertness: Awake/alert Behavior During Therapy: Anxious Overall Cognitive Status: Difficult to assess Area of Impairment: Awareness;Attention   Current Attention Level: Sustained   Following Commands: Follows one step commands inconsistently;Follows one step commands with increased time       General Comments: decreased deficit awareness    General Comments General comments (skin integrity, edema, etc.): Activity limited due to HR in 140's with any transfers or standing activitiy    Exercises        Assessment/Plan    PT Assessment Patient needs continued PT services  PT Diagnosis Hemiplegia dominant side;Generalized weakness   PT Problem List Decreased activity tolerance;Decreased  strength;Decreased balance;Decreased cognition;Decreased safety awareness;Decreased knowledge of use of DME;Decreased mobility  PT Treatment Interventions DME instruction;Therapeutic exercise;Therapeutic activities;Functional mobility training;Balance training;Neuromuscular re-education;Cognitive remediation;Patient/family education;Wheelchair mobility training   PT Goals (Current goals can be found in the Care Plan section) Acute Rehab PT Goals PT Goal Formulation: Patient unable to participate in goal setting Time For Goal Achievement: 06/02/15 Potential to Achieve Goals: Fair    Frequency Min 2X/week   Barriers to discharge        Co-evaluation               End of Session Equipment Utilized During Treatment: Gait belt Activity Tolerance: Treatment limited secondary to medical complications (Comment) (HR in 140's) Patient left: in chair;with chair alarm set;with call bell/phone within reach Nurse Communication: Mobility status         Time: 1425-1503 PT Time Calculation (min) (ACUTE ONLY): 38 min   Charges:   PT Evaluation $Initial PT Evaluation Tier I: 1 Procedure PT Treatments $Therapeutic Activity: 23-37 mins   PT G Codes:        [name],[name] 05/26/15, 4:45 PM Magda Kiel, [place] May 26, 2015

## 2015-05-19 NOTE — Progress Notes (Signed)
ANTICOAGULATION CONSULT NOTE - Follow Up Consult  Pharmacy Consult for Coumadin Indication: Hx Stroke with PFO  No Known Allergies  Patient Measurements: Height: 5\' 7"  (170.2 cm) Weight: 156 lb 8.4 oz (71 kg) IBW/kg (Calculated) : 61.6 Heparin Dosing Weight:   Vital Signs: Temp: 98.1 F (36.7 C) (11/14 0802) Temp Source: Oral (11/14 0802) BP: 112/93 mmHg (11/14 0802) Pulse Rate: 76 (11/14 0802)  Labs:  Recent Labs  05/17/15 0550  05/18/15 0420 05/18/15 0955 05/19/15 0544  HGB 9.0*  < > 8.6* 9.2* 8.5*  HCT 27.5*  < > 26.6* 28.4* 26.0*  PLT 217  --  219  --  239  LABPROT 25.3*  --   --   --  15.4*  INR 2.33*  --   --   --  1.20  CREATININE 0.72  --  0.78  --   --   < > = values in this interval not displayed.  Estimated Creatinine Clearance: 63.6 mL/min (by C-G formula based on Cr of 0.78).   Medications:  Scheduled:  . antiseptic oral rinse  7 mL Mouth Rinse q12n4p  . atorvastatin  40 mg Oral Daily  . chlorhexidine  15 mL Mouth Rinse BID  . ciprofloxacin  500 mg Oral BID  . insulin aspart  0-15 Units Subcutaneous TID WC  . insulin glargine  5 Units Subcutaneous QHS  . mesalamine  1,000 mg Rectal QHS  . pantoprazole  40 mg Oral Q0600  . polyethylene glycol  17 g Oral BID  . warfarin  3.5 mg Oral q1800  . Warfarin - Pharmacist Dosing Inpatient   Does not apply q1800    Assessment: 69yof on Coumadin 3.5mg  qday pta and hx recent stroke, PFO.  Pt was admitted with BRBPR and multiple bloody BM this admission, now s/p colonoscopy revealing rectal stercoral ulcer & active bleeding in distal rectum, on 11/10.  GI signed off 11/11, with no further bleeding.  She is bed-bound and high risk for VTE in addition to recent stroke, therefore Coumadin resumed.  INR this AM is 1.2 (last dose 11/7).  Hg 8.5- decreased, plt wnl.    Goal of Therapy:  INR 2-3 Monitor platelets by anticoagulation protocol: Yes   Plan:  Continue Coumadin 3.5mg  daily Daily INR Watch CBC, s/s of  bleeding  Gracy Bruins, PharmD Clinical Pharmacist [facility]

## 2015-05-19 NOTE — Progress Notes (Addendum)
05/19/2015 Patient foley cath was removed by Annie Main (NT) patient had clear yellow urine. Rico Sheehan RN

## 2015-05-20 LAB — CULTURE, BLOOD (ROUTINE X 2)
Culture: NO GROWTH
Culture: NO GROWTH

## 2015-05-20 LAB — PROTIME-INR
INR: 1.18 (ref 0.00–1.49)
PROTHROMBIN TIME: 15.2 s (ref 11.6–15.2)

## 2015-05-20 LAB — GLUCOSE, CAPILLARY
GLUCOSE-CAPILLARY: 165 mg/dL — AB (ref 65–99)
Glucose-Capillary: 130 mg/dL — ABNORMAL HIGH (ref 65–99)
Glucose-Capillary: 158 mg/dL — ABNORMAL HIGH (ref 65–99)

## 2015-05-20 MED ORDER — FERROUS SULFATE 325 (65 FE) MG PO TABS
325.0000 mg | ORAL_TABLET | Freq: Three times a day (TID) | ORAL | Status: DC
  Administered 2015-05-20 – 2015-05-21 (×3): 325 mg via ORAL
  Filled 2015-05-20 (×3): qty 1

## 2015-05-20 MED ORDER — MESALAMINE 1.2 G PO TBEC
1.2000 g | DELAYED_RELEASE_TABLET | Freq: Every day | ORAL | Status: DC
  Administered 2015-05-20: 1.2 g via ORAL
  Filled 2015-05-20 (×2): qty 1

## 2015-05-20 NOTE — Progress Notes (Signed)
Subjective: Awake denies any complaints. Patient had BM yesterday still had retention last night requiring reinsertion of Foley catheter.  Objective:  Vital Signs in the last 24 hours: Temp:  [97.8 F (36.6 C)-98.9 F (37.2 C)] 97.8 F (36.6 C) (11/15 0817) Pulse Rate:  [54-106] 84 (11/15 1000) Resp:  [13-21] 16 (11/15 1000) BP: (97-139)/(50-97) 107/68 mmHg (11/15 1000) SpO2:  [95 %-98 %] 98 % (11/15 1000) Weight:  [68.4 kg (150 lb 12.7 oz)] 68.4 kg (150 lb 12.7 oz) (11/15 0423)  Intake/Output from previous day: 11/14 0701 - 11/15 0700 In: 300 [P.O.:300] Out: 1600 [Urine:1600] Intake/Output from this shift:    Physical Exam: Neck: no adenopathy, no carotid bruit, no JVD and supple, symmetrical, trachea midline Lungs: clear to auscultation bilaterally Heart: regular rate and rhythm, S1, S2 normal and Soft systolic murmur noted Abdomen: soft, non-tender; bowel sounds normal; no masses,  no organomegaly Extremities: extremities normal, atraumatic, no cyanosis or edema  Lab Results:  Recent Labs  05/18/15 0420 05/18/15 0955 05/19/15 0544  WBC 7.7  --  7.8  HGB 8.6* 9.2* 8.5*  PLT 219  --  239    Recent Labs  05/18/15 0420  NA 141  K 3.2*  CL 111  CO2 23  GLUCOSE 129*  BUN <5*  CREATININE 0.78   No results for input(s): TROPONINI in the last 72 hours.  Invalid input(s): CK, MB Hepatic Function Panel No results for input(s): PROT, ALBUMIN, AST, ALT, ALKPHOS, BILITOT, BILIDIR, IBILI in the last 72 hours. No results for input(s): CHOL in the last 72 hours. No results for input(s): PROTIME in the last 72 hours.  Imaging: Imaging results have been reviewed and No results found.  Cardiac Studies:  Assessment/Plan:  Status post Recurrent lower GI bleed Status post hypotensive shock Recent left CVA with right paresis Mild CAD in the past Hypertension Diabetes mellitus Compensated CHF secondary to preserved LV systolic function PFO History of tobacco abuse  in the past Hyperlipidemia Escherichia coli UTI Plan Add Feosol Check labs in a.m. Awaiting skilled nursing facility  LOS: 7 days    Charolette Forward 05/20/2015, 12:34 PM

## 2015-05-20 NOTE — Progress Notes (Signed)
ANTICOAGULATION CONSULT NOTE - Follow Up Consult  Pharmacy Consult for Coumadin Indication: Hx Stroke with PFO  No Known Allergies  Patient Measurements: Height: 5\' 7"  (170.2 cm) Weight: 150 lb 12.7 oz (68.4 kg) IBW/kg (Calculated) : 61.6 Heparin Dosing Weight:   Vital Signs: Temp: 98.5 F (36.9 C) (11/15 1200) Temp Source: Oral (11/15 1200) BP: 111/70 mmHg (11/15 1200) Pulse Rate: 85 (11/15 1200)  Labs:  Recent Labs  05/18/15 0420 05/18/15 0955 05/19/15 0544 05/20/15 0451  HGB 8.6* 9.2* 8.5*  --   HCT 26.6* 28.4* 26.0*  --   PLT 219  --  239  --   LABPROT  --   --  15.4* 15.2  INR  --   --  1.20 1.18  CREATININE 0.78  --   --   --     Estimated Creatinine Clearance: 63.6 mL/min (by C-G formula based on Cr of 0.78).   Medications:  Scheduled:  . antiseptic oral rinse  7 mL Mouth Rinse q12n4p  . atorvastatin  40 mg Oral Daily  . chlorhexidine  15 mL Mouth Rinse BID  . ciprofloxacin  500 mg Oral BID  . ferrous sulfate  325 mg Oral TID WC  . insulin aspart  0-15 Units Subcutaneous TID WC  . insulin glargine  5 Units Subcutaneous QHS  . mesalamine  1,000 mg Rectal QHS  . pantoprazole  40 mg Oral Q0600  . polyethylene glycol  17 g Oral BID  . warfarin  3.5 mg Oral q1800  . Warfarin - Pharmacist Dosing Inpatient   Does not apply q1800    Assessment: 69yof on Coumadin 3.5mg  qday pta and hx recent stroke, PFO.  Pt was admitted with BRBPR and multiple bloody BM this admission, now s/p colonoscopy revealing rectal stercoral ulcer & active bleeding in distal rectum, on 11/10.  GI signed off 11/11, with no further bleeding.  She is bed-bound and high risk for VTE in addition to recent stroke, therefore Coumadin resumed.    INR = 1.18  Goal of Therapy:  INR 2-3 Monitor platelets by anticoagulation protocol: Yes   Plan:  Continue Coumadin 3.5mg  daily Daily INR Watch CBC, s/s of bleeding  Thank you Anette Guarneri, PharmD [phone]

## 2015-05-20 NOTE — Progress Notes (Signed)
[place] Progress Note Patient Name: [name] DOB: 1945-10-21 MRN: [id number]   Date of Service  05/20/2015  HPI/Events of Note  788 on blader scan No outputfoley  eICU Interventions       Intervention Category Intermediate Interventions: OtherRaylene Miyamoto. 05/20/2015, 5:14 AM

## 2015-05-20 NOTE — Progress Notes (Signed)
Speech Language Pathology Treatment: Dysphagia  Patient Details Name: [name] MRN: [id number] DOB: [birth year] Today's Date: 05/20/2015 Time: 8412-8208 SLP Time Calculation (min) (ACUTE ONLY): 10 min  Assessment / Plan / Recommendation Clinical Impression  Improved pharyngeal swallow from yesterday. No indications of penetration/aspiration with Dys 3 texture or thin via straw. Pt independently removed left lingual residue given additional time. Min verbal reminders for small sips and use lingual sweep at end of meals. Difficult to determine level of comprehension due to CVA 04/2015 (had speech-lang eval at that time). She appeared to comprehend basic info related to swallowing during session. Expressive language limited to "yea" for most questions. Recommend continue Dys 3 texture due to being edentulous and thin. Will discharge ST at this time. Recommend follow up ST at SNF for possible texture upgrade and language assessment and intervention.    HPI HPI: [name] is a 69 y.o. female with a past medical history that includes CAD, diabetes, left CVA with right hemiparesis 7 days ago [facility] with the chief complaint of bright red blood per rectum. Pt had colonoscopy that revealed recta lulcer. On  05-15-15 pt had hypotensive shock with an unresponsive episode and CPR was performed.      SLP Plan  All goals met;Discharge SLP treatment due to (comment)     Recommendations  Diet recommendations: Dysphagia 3 (mechanical soft);Thin liquid Liquids provided via: Straw;Cup Medication Administration: Whole meds with puree Supervision: Patient able to self feed;Intermittent supervision to cue for compensatory strategies Compensations: Small sips/bites;Slow rate;Follow solids with liquid Postural Changes and/or Swallow Maneuvers: Seated upright 90 degrees;Upright 30-60 min after meal              Oral Care Recommendations: Oral care  BID Follow up Recommendations: 24 hour supervision/assistance;Skilled Nursing facility Plan: All goals met;Discharge SLP treatment due to (comment)   Houston Siren 05/20/2015, 11:59 AM  Orbie Pyo Colvin Caroli.Ed [employer] [phone]

## 2015-05-20 NOTE — Clinical Social Work Note (Signed)
[facility] does NOT have a bed for patient's return today. Female bed will be available tomorrow per admissions director of [facility].   FL-2 requiring co-signature from MD.   CSW will continue to follow patient and pt's family for continued support and to facilitate pt's discharge needs once medically stable.   Glendon Axe, MSW, LCSWA [phone] 05/20/2015 10:34 AM

## 2015-05-20 NOTE — Progress Notes (Signed)
OT Cancellation Note  Patient Details Name: [name] MRN: [id number] DOB: 12-19-1945   Cancelled Treatment:    Reason Eval/Treat Not Completed: OT screened, no needs identified, will sign off - Per chart, pt admitted from SNF with plan to return to SNF tomorrow.  She has Medicare which does not require OT eval for authorization, will defer OT eval to SNF due to likely discharge tomorrow and all needs can be addressed at SNF.  Please page [phone], or call [phone] if discharge plan changes, and OT will initiate evaluation.   Darlina Rumpf [place], OTR/L [id number]  05/20/2015, 11:10 AM

## 2015-05-21 DIAGNOSIS — R739 Hyperglycemia, unspecified: Secondary | ICD-10-CM | POA: Diagnosis not present

## 2015-05-21 DIAGNOSIS — R262 Difficulty in walking, not elsewhere classified: Secondary | ICD-10-CM | POA: Diagnosis not present

## 2015-05-21 DIAGNOSIS — I5022 Chronic systolic (congestive) heart failure: Secondary | ICD-10-CM | POA: Diagnosis present

## 2015-05-21 DIAGNOSIS — R002 Palpitations: Secondary | ICD-10-CM | POA: Diagnosis not present

## 2015-05-21 DIAGNOSIS — I639 Cerebral infarction, unspecified: Secondary | ICD-10-CM | POA: Diagnosis not present

## 2015-05-21 DIAGNOSIS — M79645 Pain in left finger(s): Secondary | ICD-10-CM | POA: Diagnosis not present

## 2015-05-21 DIAGNOSIS — S0990XA Unspecified injury of head, initial encounter: Secondary | ICD-10-CM | POA: Diagnosis not present

## 2015-05-21 DIAGNOSIS — Q211 Atrial septal defect: Secondary | ICD-10-CM | POA: Diagnosis not present

## 2015-05-21 DIAGNOSIS — M542 Cervicalgia: Secondary | ICD-10-CM | POA: Diagnosis not present

## 2015-05-21 DIAGNOSIS — M810 Age-related osteoporosis without current pathological fracture: Secondary | ICD-10-CM | POA: Diagnosis present

## 2015-05-21 DIAGNOSIS — S72141A Displaced intertrochanteric fracture of right femur, initial encounter for closed fracture: Secondary | ICD-10-CM | POA: Diagnosis not present

## 2015-05-21 DIAGNOSIS — D509 Iron deficiency anemia, unspecified: Secondary | ICD-10-CM | POA: Diagnosis not present

## 2015-05-21 DIAGNOSIS — K51211 Ulcerative (chronic) proctitis with rectal bleeding: Secondary | ICD-10-CM | POA: Diagnosis not present

## 2015-05-21 DIAGNOSIS — K219 Gastro-esophageal reflux disease without esophagitis: Secondary | ICD-10-CM | POA: Diagnosis not present

## 2015-05-21 DIAGNOSIS — Z79899 Other long term (current) drug therapy: Secondary | ICD-10-CM | POA: Diagnosis not present

## 2015-05-21 DIAGNOSIS — E119 Type 2 diabetes mellitus without complications: Secondary | ICD-10-CM | POA: Diagnosis present

## 2015-05-21 DIAGNOSIS — W06XXXA Fall from bed, initial encounter: Secondary | ICD-10-CM | POA: Diagnosis present

## 2015-05-21 DIAGNOSIS — M79651 Pain in right thigh: Secondary | ICD-10-CM | POA: Diagnosis not present

## 2015-05-21 DIAGNOSIS — R404 Transient alteration of awareness: Secondary | ICD-10-CM | POA: Diagnosis not present

## 2015-05-21 DIAGNOSIS — K626 Ulcer of anus and rectum: Secondary | ICD-10-CM | POA: Diagnosis not present

## 2015-05-21 DIAGNOSIS — G3184 Mild cognitive impairment, so stated: Secondary | ICD-10-CM | POA: Diagnosis not present

## 2015-05-21 DIAGNOSIS — M25561 Pain in right knee: Secondary | ICD-10-CM | POA: Diagnosis not present

## 2015-05-21 DIAGNOSIS — I5020 Unspecified systolic (congestive) heart failure: Secondary | ICD-10-CM | POA: Diagnosis not present

## 2015-05-21 DIAGNOSIS — I5040 Unspecified combined systolic (congestive) and diastolic (congestive) heart failure: Secondary | ICD-10-CM | POA: Diagnosis not present

## 2015-05-21 DIAGNOSIS — R5381 Other malaise: Secondary | ICD-10-CM | POA: Diagnosis not present

## 2015-05-21 DIAGNOSIS — I69351 Hemiplegia and hemiparesis following cerebral infarction affecting right dominant side: Secondary | ICD-10-CM | POA: Diagnosis not present

## 2015-05-21 DIAGNOSIS — I119 Hypertensive heart disease without heart failure: Secondary | ICD-10-CM | POA: Diagnosis not present

## 2015-05-21 DIAGNOSIS — R0789 Other chest pain: Secondary | ICD-10-CM | POA: Diagnosis not present

## 2015-05-21 DIAGNOSIS — I63422 Cerebral infarction due to embolism of left anterior cerebral artery: Secondary | ICD-10-CM | POA: Diagnosis not present

## 2015-05-21 DIAGNOSIS — Z794 Long term (current) use of insulin: Secondary | ICD-10-CM | POA: Diagnosis not present

## 2015-05-21 DIAGNOSIS — M6281 Muscle weakness (generalized): Secondary | ICD-10-CM | POA: Diagnosis not present

## 2015-05-21 DIAGNOSIS — Z9181 History of falling: Secondary | ICD-10-CM | POA: Diagnosis not present

## 2015-05-21 DIAGNOSIS — F17290 Nicotine dependence, other tobacco product, uncomplicated: Secondary | ICD-10-CM | POA: Diagnosis not present

## 2015-05-21 DIAGNOSIS — R52 Pain, unspecified: Secondary | ICD-10-CM | POA: Diagnosis not present

## 2015-05-21 DIAGNOSIS — Z7901 Long term (current) use of anticoagulants: Secondary | ICD-10-CM | POA: Diagnosis not present

## 2015-05-21 DIAGNOSIS — M940 Chondrocostal junction syndrome [Tietze]: Secondary | ICD-10-CM | POA: Diagnosis not present

## 2015-05-21 DIAGNOSIS — I69320 Aphasia following cerebral infarction: Secondary | ICD-10-CM | POA: Diagnosis not present

## 2015-05-21 DIAGNOSIS — Z23 Encounter for immunization: Secondary | ICD-10-CM | POA: Diagnosis not present

## 2015-05-21 DIAGNOSIS — E785 Hyperlipidemia, unspecified: Secondary | ICD-10-CM | POA: Diagnosis present

## 2015-05-21 DIAGNOSIS — M25551 Pain in right hip: Secondary | ICD-10-CM | POA: Diagnosis not present

## 2015-05-21 DIAGNOSIS — I2510 Atherosclerotic heart disease of native coronary artery without angina pectoris: Secondary | ICD-10-CM | POA: Diagnosis present

## 2015-05-21 DIAGNOSIS — D62 Acute posthemorrhagic anemia: Secondary | ICD-10-CM | POA: Diagnosis not present

## 2015-05-21 DIAGNOSIS — N390 Urinary tract infection, site not specified: Secondary | ICD-10-CM | POA: Diagnosis not present

## 2015-05-21 DIAGNOSIS — R4701 Aphasia: Secondary | ICD-10-CM | POA: Diagnosis not present

## 2015-05-21 DIAGNOSIS — I6990 Unspecified sequelae of unspecified cerebrovascular disease: Secondary | ICD-10-CM | POA: Diagnosis not present

## 2015-05-21 DIAGNOSIS — I10 Essential (primary) hypertension: Secondary | ICD-10-CM | POA: Diagnosis present

## 2015-05-21 DIAGNOSIS — R579 Shock, unspecified: Secondary | ICD-10-CM | POA: Diagnosis not present

## 2015-05-21 DIAGNOSIS — S299XXA Unspecified injury of thorax, initial encounter: Secondary | ICD-10-CM | POA: Diagnosis not present

## 2015-05-21 DIAGNOSIS — K922 Gastrointestinal hemorrhage, unspecified: Secondary | ICD-10-CM | POA: Diagnosis not present

## 2015-05-21 DIAGNOSIS — R509 Fever, unspecified: Secondary | ICD-10-CM | POA: Diagnosis not present

## 2015-05-21 DIAGNOSIS — F17210 Nicotine dependence, cigarettes, uncomplicated: Secondary | ICD-10-CM | POA: Diagnosis present

## 2015-05-21 DIAGNOSIS — R1312 Dysphagia, oropharyngeal phase: Secondary | ICD-10-CM | POA: Diagnosis not present

## 2015-05-21 DIAGNOSIS — R531 Weakness: Secondary | ICD-10-CM | POA: Diagnosis not present

## 2015-05-21 DIAGNOSIS — Y92122 Bedroom in nursing home as the place of occurrence of the external cause: Secondary | ICD-10-CM | POA: Diagnosis not present

## 2015-05-21 LAB — GLUCOSE, CAPILLARY
Glucose-Capillary: 171 mg/dL — ABNORMAL HIGH (ref 65–99)
Glucose-Capillary: 154 mg/dL — ABNORMAL HIGH (ref 65–99)
Glucose-Capillary: 230 mg/dL — ABNORMAL HIGH (ref 65–99)
Glucose-Capillary: 294 mg/dL — ABNORMAL HIGH (ref 65–99)

## 2015-05-21 LAB — CBC
HEMATOCRIT: 26.8 % — AB (ref 36.0–46.0)
HEMOGLOBIN: 8.6 g/dL — AB (ref 12.0–15.0)
MCH: 28.4 pg (ref 26.0–34.0)
MCHC: 32.1 g/dL (ref 30.0–36.0)
MCV: 88.4 fL (ref 78.0–100.0)
Platelets: 248 10*3/uL (ref 150–400)
RBC: 3.03 MIL/uL — ABNORMAL LOW (ref 3.87–5.11)
RDW: 14.2 % (ref 11.5–15.5)
WBC: 8.2 10*3/uL (ref 4.0–10.5)

## 2015-05-21 LAB — PROTIME-INR
INR: 1.41 (ref 0.00–1.49)
Prothrombin Time: 17.3 seconds — ABNORMAL HIGH (ref 11.6–15.2)

## 2015-05-21 MED ORDER — POLYETHYLENE GLYCOL 3350 17 G PO PACK: 17.0000 g | PACK | Freq: Every day | ORAL | Status: DC

## 2015-05-21 MED ORDER — METOPROLOL SUCCINATE ER 25 MG PO TB24: 25.0000 mg | ORAL_TABLET | Freq: Every day | ORAL | Status: DC

## 2015-05-21 MED ORDER — INSULIN GLARGINE 100 UNIT/ML ~~LOC~~ SOLN: 5.0000 [IU] | Freq: Every day | SUBCUTANEOUS | Status: DC

## 2015-05-21 MED ORDER — FERROUS SULFATE 325 (65 FE) MG PO TABS: 325.0000 mg | ORAL_TABLET | Freq: Three times a day (TID) | ORAL | Status: DC

## 2015-05-21 MED ORDER — WARFARIN SODIUM 3 MG PO TABS: 3.0000 mg | ORAL_TABLET | Freq: Every day | ORAL | Status: DC

## 2015-05-21 MED ORDER — CIPROFLOXACIN HCL 500 MG PO TABS: 500.0000 mg | ORAL_TABLET | Freq: Two times a day (BID) | ORAL | Status: DC

## 2015-05-21 NOTE — Clinical Social Work Note (Signed)
[facility] does NOT have a bed available today for patient to return. CSW has contacted pt's son who is now against patient discharging to [facility]. CSW discussed a back up plan yesterday in the event that [facility] did not have available beds at discharge and pt's son stated [facility] as second option. Facility able to admit patient today.   Pt's son is NOT understanding why patient can NOT transition back to [facility]. CSW explained the situation several times. Pt's son requested number to contact admissions coordinator stating that facility promised his mother a bed to return to.   CSW to notify RNCM. CSW remains available as needed.   Glendon Axe, MSW, [place] [phone] 05/21/2015 2:04 PM

## 2015-05-21 NOTE — Clinical Social Work Note (Signed)
Clinical Social Worker facilitated patient discharge including contacting patient family and facility to confirm patient discharge plans.  Clinical information faxed to facility and family agreeable with plan.  CSW arranged ambulance transport via [facility] to [facility].  RN to call report prior to discharge [phone].  DC packet prepared for transport.   Clinical Social Worker will sign off for now as social work intervention is no longer needed. Please consult [place] again if new need arises.  Glendon Axe, MSW, LCSWA [phone] 05/21/2015 3:21 PM

## 2015-05-21 NOTE — Clinical Social Work Placement (Signed)
   CLINICAL SOCIAL WORK PLACEMENT  NOTE  Date:  05/21/2015  Patient Details  Name: [name] MRN: [id number] Date of Birth: May 21, 1946  Clinical Social Work is seeking post-discharge placement for this patient at the [place] level of care (*CSW will initial, date and re-position this form in  chart as items are completed):  Yes   Patient/family provided with [place] Work Department's list of facilities offering this level of care within the geographic area requested by the patient (or if unable, by the patient's family).  Yes   Patient/family informed of their freedom to choose among providers that offer the needed level of care, that participate in Medicare, Medicaid or managed care program needed by the patient, have an available bed and are willing to accept the patient.  Yes   Patient/family informed of [facility]'s ownership interest in [facility] and [facility], as well as of the fact that they are under no obligation to receive care at these facilities.  PASRR submitted to EDS on       PASRR number received on       Existing PASRR number confirmed on 05/21/15     FL2 transmitted to all facilities in geographic area requested by pt/family on 05/21/15     FL2 transmitted to all facilities within larger geographic area on       Patient informed that his/her managed care company has contracts with or will negotiate with certain facilities, including the following:        Yes   Patient/family informed of bed offers received.  Patient chooses bed at  [facility] )     Physician recommends and patient chooses bed at      Patient to be transferred to  [facility] ) on 05/21/15.  Patient to be transferred to facility by       Patient family notified on 05/21/15 of transfer.  Name of family member notified:   (Pt's son, [name] )     PHYSICIAN       Additional Comment:     _______________________________________________ Glendon Axe, MSW, [place] [phone] 05/21/2015 3:21 PM

## 2015-05-21 NOTE — Clinical Social Work Note (Signed)
Pt's son now agreeable to patient discharging to [facility].   CSW remains available as needed.   Glendon Axe, MSW, LCSWA [phone] 05/21/2015 2:11 PM

## 2015-05-21 NOTE — Progress Notes (Signed)
Patient discharged to [facility] via [facility]. 

## 2015-05-21 NOTE — Discharge Summary (Signed)
[name], [name]               ACCOUNT NO.:  [id number]  MEDICAL RECORD NO.:  [id number]  LOCATION:  2C08C                        FACILITY:  [facility]  PHYSICIAN:  Zamia Tyminski N. Terrence Dupont, M.D. DATE OF BIRTH:  [birth year]  DATE OF ADMISSION:  05/13/2015 DATE OF DISCHARGE:  05/21/2015                              DISCHARGE SUMMARY   ADMITTING DIAGNOSES: 1. Acute lower gastrointestinal bleed. 2. Recent left-sided cerebrovascular accident. 3. Coronary artery disease. 4. History of congestive heart failure secondary to preserved LV     systolic function. 5. Diabetes mellitus. 6. Hypertension. 7. Hyperlipidemia.  DISCHARGE DIAGNOSES: 1. Status post recurrent lower gastrointestinal bleed secondary to     stercoral rectal ulcers status post colonoscopy. 2. Status post hypotensive shock. 3. Status post recent left cerebrovascular accident with right     paresis. 4. Mild coronary artery disease in the past. 5. Hypertension. 6. Diabetes mellitus. 7. Compensated congestive heart failure secondary to preserved LV     systolic function. 8. Patent foramen ovale. 9. History of tobacco abuse in the past. 10.Hyperlipidemia. 11.Resolving E coli urinary tract infection.  DISCHARGED MEDICATIONS: 1. Ciprofloxacin 500 mg one tablet twice daily for 5 more days. 2. Ferrous sulfate 325 mg one tablet three times daily. 3. MiraLAX 17 g daily as needed. 4. Tylenol 650 mg every 6 hours as needed. 5. Atorvastatin 40 mg one tablet twice daily. 6. Lantus insulin 5 units daily.  Regular insulin Novolin R sliding     scale as before. 7. Micatin cream apply locally. 8. Nitrostat 0.4 mg sublingual p.r.n. 9. Protonix 40 mg one tablet daily. 10.Metoprolol succinate 25 mg one tablet daily. 11.Warfarin dose has been reduced to 3 mg daily. Stop amlodipine and clopidogrel.  DIET:  Low salt low cholesterol 1800 calories dysphagia diet as before.  CONDITION AT DISCHARGE:  Stable.  We will check PT/INR early next  week.  The patient will be transferred to [facility].  BRIEF HISTORY:  [name] is a 69 year old female with a past medical history significant for recent left anterior cerebral artery infarct in the setting of A2 occlusion, possible large vessel atheroma, possible embolic event, moderate to high-grade stenosis of right A2 segment, history of coronary artery disease, hypertension, diabetes mellitus, congestive heart failure secondary to preserved LV systolic function, history of tobacco abuse, hyperlipidemia, recently had a TEE, noted to have patent foramen ovale, recently discharged from the hospital to [facility], came to the ER because of bright red blood per rectum. Initial evaluation in the emergency department revealed massive positive Hemoccult and the patient incontinent of large bright red blood with clots during the emergency department stay.  The patient was discharged home on Plavix and Coumadin approximately 7 days ago.  She was transferred to the ER because of bright red blood per rectum.  The patient denies any abdominal pain, nausea, vomiting.  She denies any chest pain, palpitation, shortness of breath.  Her answers were limited to yes or no due to difficult to understand due to recent stroke.  In the ED, the patient was noted to have hemoglobin of 15.5, hematocrit 46.4.  She was afebrile, hemodynamically stable, non-hypoxic.  GI consultation was called by  [facility] in the ED.  PHYSICAL EXAMINATION:  VITAL SIGNS:  On examination, her blood pressure was 111/68, pulse 64. HEENT:  Conjunctivae were pink. NECK:  Supple.  No JVD.  No bruit. LUNGS:  Clear to auscultation without rhonchi or rales. CARDIOVASCULAR:  S1, S2 was normal.  There was no murmur, gallop, or rub. ABDOMEN:  Soft, nontender. EXTREMITIES:  There is no clubbing, cyanosis, or edema. NEURO:  She had right-sided hemiparesis.  LABORATORY DATA:  Admission labs; her  sodium was 135, potassium 4.4, BUN 22, creatinine 0.90; hemoglobin was 15.5, hematocrit 46.4, white count of 16.7, with no shift to the left.  Her PT was 20.5.  INR 1.76.  Blood sugar was 115.  Repeat hemoglobin on November 10 after colonoscopy was 9.8, hematocrit 30.3, white count of 16.5.  The patient at that stage was actively bleeding, required packed RBCs transfusion.  Post- transfusion, hemoglobin was 8.2, hematocrit 25.3, white count of 10.8 which has been stable for last 2 days.  Today, hemoglobin is 8.6, hematocrit 26.8, white count of 8.2.  Today, PT is 17.3.  INR 1.41.  BRIEF HOSPITAL COURSE:  The patient was admitted to telemetry unit and GI consultation was obtained.  The patient subsequently underwent colonoscopy.  There was poor prep.  She was noted to have stercoral ulcer in the rectum with no evidence of active bleeding, but there was a large amount of blood clots.  The patient subsequently had an episode of massive GI bleed with incontinence of stool and hypotension requiring transfer to intensive care unit and transfusion of packed RBCs.  Her Plavix and Coumadin were on hold.  The patient did not have any further episodes of GI bleeding.  Although the patient developed UTI requiring IV Rocephin which has been switched to p.o. Cipro, which she is tolerating it well, urine culture grew E coli which is sensitive to ciprofloxacin.  Discussed with GI regarding anticoagulation, agree for Coumadin only and avoiding antiplatelet medications.  The patient's hemoglobin has been stable.  We will reduce the dose of Coumadin to 3 mg daily while on ciprofloxacin. We will monitor her PT/INR early next week.  The patient will be discharged to [facility].     Allegra Lai. Terrence Dupont, M.D.     MNH/MEDQ  D:  05/21/2015  T:  05/21/2015  Job:  [id number]

## 2015-05-21 NOTE — Discharge Instructions (Signed)

## 2015-05-21 NOTE — Discharge Summary (Signed)
Discharge summary dictated on 05/21/2015 dictation number is [id number]

## 2015-05-22 DIAGNOSIS — E119 Type 2 diabetes mellitus without complications: Secondary | ICD-10-CM | POA: Diagnosis not present

## 2015-05-22 DIAGNOSIS — R739 Hyperglycemia, unspecified: Secondary | ICD-10-CM | POA: Diagnosis not present

## 2015-05-26 DIAGNOSIS — M6281 Muscle weakness (generalized): Secondary | ICD-10-CM | POA: Diagnosis not present

## 2015-05-26 DIAGNOSIS — R5381 Other malaise: Secondary | ICD-10-CM | POA: Diagnosis not present

## 2015-05-26 DIAGNOSIS — G3184 Mild cognitive impairment, so stated: Secondary | ICD-10-CM | POA: Diagnosis not present

## 2015-05-26 DIAGNOSIS — I6990 Unspecified sequelae of unspecified cerebrovascular disease: Secondary | ICD-10-CM | POA: Diagnosis not present

## 2015-05-27 DIAGNOSIS — M940 Chondrocostal junction syndrome [Tietze]: Secondary | ICD-10-CM | POA: Diagnosis not present

## 2015-05-27 DIAGNOSIS — R0789 Other chest pain: Secondary | ICD-10-CM | POA: Diagnosis not present

## 2015-06-01 DIAGNOSIS — I10 Essential (primary) hypertension: Secondary | ICD-10-CM | POA: Diagnosis not present

## 2015-06-01 DIAGNOSIS — K922 Gastrointestinal hemorrhage, unspecified: Secondary | ICD-10-CM | POA: Diagnosis not present

## 2015-06-01 DIAGNOSIS — I63422 Cerebral infarction due to embolism of left anterior cerebral artery: Secondary | ICD-10-CM | POA: Diagnosis not present

## 2015-06-01 DIAGNOSIS — E119 Type 2 diabetes mellitus without complications: Secondary | ICD-10-CM | POA: Diagnosis not present

## 2015-06-02 DIAGNOSIS — E119 Type 2 diabetes mellitus without complications: Secondary | ICD-10-CM | POA: Diagnosis not present

## 2015-06-02 DIAGNOSIS — M940 Chondrocostal junction syndrome [Tietze]: Secondary | ICD-10-CM | POA: Diagnosis not present

## 2015-06-02 DIAGNOSIS — I63422 Cerebral infarction due to embolism of left anterior cerebral artery: Secondary | ICD-10-CM | POA: Diagnosis not present

## 2015-06-02 DIAGNOSIS — R0789 Other chest pain: Secondary | ICD-10-CM | POA: Diagnosis not present

## 2015-06-04 DIAGNOSIS — M6281 Muscle weakness (generalized): Secondary | ICD-10-CM | POA: Diagnosis not present

## 2015-06-04 DIAGNOSIS — R5381 Other malaise: Secondary | ICD-10-CM | POA: Diagnosis not present

## 2015-06-04 DIAGNOSIS — G3184 Mild cognitive impairment, so stated: Secondary | ICD-10-CM | POA: Diagnosis not present

## 2015-06-04 DIAGNOSIS — I6990 Unspecified sequelae of unspecified cerebrovascular disease: Secondary | ICD-10-CM | POA: Diagnosis not present

## 2015-06-10 ENCOUNTER — Emergency Department (HOSPITAL_COMMUNITY): Payer: Medicare Other

## 2015-06-10 ENCOUNTER — Encounter (HOSPITAL_COMMUNITY): Payer: Self-pay

## 2015-06-10 ENCOUNTER — Inpatient Hospital Stay (HOSPITAL_COMMUNITY)
Admission: EM | Admit: 2015-06-10 | Discharge: 2015-06-16 | DRG: 481 | Disposition: A | Discharge status: Skilled Nursing Facility | Payer: Medicare Other | Attending: Cardiology | Admitting: Cardiology

## 2015-06-10 DIAGNOSIS — E119 Type 2 diabetes mellitus without complications: Secondary | ICD-10-CM | POA: Diagnosis present

## 2015-06-10 DIAGNOSIS — I69320 Aphasia following cerebral infarction: Secondary | ICD-10-CM | POA: Diagnosis not present

## 2015-06-10 DIAGNOSIS — Z419 Encounter for procedure for purposes other than remedying health state, unspecified: Secondary | ICD-10-CM

## 2015-06-10 DIAGNOSIS — M25551 Pain in right hip: Secondary | ICD-10-CM | POA: Diagnosis not present

## 2015-06-10 DIAGNOSIS — R509 Fever, unspecified: Secondary | ICD-10-CM | POA: Diagnosis not present

## 2015-06-10 DIAGNOSIS — S72001A Fracture of unspecified part of neck of right femur, initial encounter for closed fracture: Secondary | ICD-10-CM

## 2015-06-10 DIAGNOSIS — M810 Age-related osteoporosis without current pathological fracture: Principal | ICD-10-CM | POA: Diagnosis present

## 2015-06-10 DIAGNOSIS — Z794 Long term (current) use of insulin: Secondary | ICD-10-CM

## 2015-06-10 DIAGNOSIS — Z4789 Encounter for other orthopedic aftercare: Secondary | ICD-10-CM | POA: Diagnosis not present

## 2015-06-10 DIAGNOSIS — Z7901 Long term (current) use of anticoagulants: Secondary | ICD-10-CM | POA: Diagnosis not present

## 2015-06-10 DIAGNOSIS — M25561 Pain in right knee: Secondary | ICD-10-CM | POA: Diagnosis not present

## 2015-06-10 DIAGNOSIS — S0990XA Unspecified injury of head, initial encounter: Secondary | ICD-10-CM | POA: Diagnosis not present

## 2015-06-10 DIAGNOSIS — Z9889 Other specified postprocedural states: Secondary | ICD-10-CM | POA: Diagnosis not present

## 2015-06-10 DIAGNOSIS — E785 Hyperlipidemia, unspecified: Secondary | ICD-10-CM | POA: Diagnosis present

## 2015-06-10 DIAGNOSIS — K625 Hemorrhage of anus and rectum: Secondary | ICD-10-CM | POA: Diagnosis not present

## 2015-06-10 DIAGNOSIS — S72141D Displaced intertrochanteric fracture of right femur, subsequent encounter for closed fracture with routine healing: Secondary | ICD-10-CM | POA: Diagnosis not present

## 2015-06-10 DIAGNOSIS — I69151 Hemiplegia and hemiparesis following nontraumatic intracerebral hemorrhage affecting right dominant side: Secondary | ICD-10-CM | POA: Diagnosis not present

## 2015-06-10 DIAGNOSIS — R52 Pain, unspecified: Secondary | ICD-10-CM | POA: Diagnosis not present

## 2015-06-10 DIAGNOSIS — M542 Cervicalgia: Secondary | ICD-10-CM | POA: Diagnosis not present

## 2015-06-10 DIAGNOSIS — D62 Acute posthemorrhagic anemia: Secondary | ICD-10-CM | POA: Diagnosis not present

## 2015-06-10 DIAGNOSIS — F17290 Nicotine dependence, other tobacco product, uncomplicated: Secondary | ICD-10-CM | POA: Diagnosis not present

## 2015-06-10 DIAGNOSIS — I10 Essential (primary) hypertension: Secondary | ICD-10-CM | POA: Diagnosis present

## 2015-06-10 DIAGNOSIS — Z9181 History of falling: Secondary | ICD-10-CM | POA: Diagnosis not present

## 2015-06-10 DIAGNOSIS — I2510 Atherosclerotic heart disease of native coronary artery without angina pectoris: Secondary | ICD-10-CM | POA: Diagnosis present

## 2015-06-10 DIAGNOSIS — Q211 Atrial septal defect: Secondary | ICD-10-CM | POA: Diagnosis not present

## 2015-06-10 DIAGNOSIS — I69351 Hemiplegia and hemiparesis following cerebral infarction affecting right dominant side: Secondary | ICD-10-CM

## 2015-06-10 DIAGNOSIS — R531 Weakness: Secondary | ICD-10-CM | POA: Diagnosis not present

## 2015-06-10 DIAGNOSIS — S72144A Nondisplaced intertrochanteric fracture of right femur, initial encounter for closed fracture: Secondary | ICD-10-CM | POA: Diagnosis not present

## 2015-06-10 DIAGNOSIS — R404 Transient alteration of awareness: Secondary | ICD-10-CM | POA: Diagnosis not present

## 2015-06-10 DIAGNOSIS — S728X2A Other fracture of left femur, initial encounter for closed fracture: Secondary | ICD-10-CM | POA: Diagnosis not present

## 2015-06-10 DIAGNOSIS — W06XXXA Fall from bed, initial encounter: Secondary | ICD-10-CM | POA: Diagnosis present

## 2015-06-10 DIAGNOSIS — I5022 Chronic systolic (congestive) heart failure: Secondary | ICD-10-CM | POA: Diagnosis present

## 2015-06-10 DIAGNOSIS — S299XXA Unspecified injury of thorax, initial encounter: Secondary | ICD-10-CM | POA: Diagnosis not present

## 2015-06-10 DIAGNOSIS — Y92122 Bedroom in nursing home as the place of occurrence of the external cause: Secondary | ICD-10-CM | POA: Diagnosis not present

## 2015-06-10 DIAGNOSIS — M79651 Pain in right thigh: Secondary | ICD-10-CM | POA: Diagnosis not present

## 2015-06-10 DIAGNOSIS — S8991XA Unspecified injury of right lower leg, initial encounter: Secondary | ICD-10-CM | POA: Diagnosis not present

## 2015-06-10 DIAGNOSIS — Z79899 Other long term (current) drug therapy: Secondary | ICD-10-CM

## 2015-06-10 DIAGNOSIS — S72141A Displaced intertrochanteric fracture of right femur, initial encounter for closed fracture: Secondary | ICD-10-CM | POA: Diagnosis not present

## 2015-06-10 DIAGNOSIS — I509 Heart failure, unspecified: Secondary | ICD-10-CM | POA: Diagnosis not present

## 2015-06-10 DIAGNOSIS — R489 Unspecified symbolic dysfunctions: Secondary | ICD-10-CM | POA: Diagnosis not present

## 2015-06-10 DIAGNOSIS — F17210 Nicotine dependence, cigarettes, uncomplicated: Secondary | ICD-10-CM | POA: Diagnosis present

## 2015-06-10 DIAGNOSIS — G3184 Mild cognitive impairment, so stated: Secondary | ICD-10-CM | POA: Diagnosis not present

## 2015-06-10 DIAGNOSIS — R1312 Dysphagia, oropharyngeal phase: Secondary | ICD-10-CM | POA: Diagnosis not present

## 2015-06-10 DIAGNOSIS — M6281 Muscle weakness (generalized): Secondary | ICD-10-CM | POA: Diagnosis not present

## 2015-06-10 DIAGNOSIS — I5020 Unspecified systolic (congestive) heart failure: Secondary | ICD-10-CM | POA: Diagnosis not present

## 2015-06-10 DIAGNOSIS — I639 Cerebral infarction, unspecified: Secondary | ICD-10-CM | POA: Diagnosis not present

## 2015-06-10 DIAGNOSIS — S7290XA Unspecified fracture of unspecified femur, initial encounter for closed fracture: Secondary | ICD-10-CM

## 2015-06-10 DIAGNOSIS — I6990 Unspecified sequelae of unspecified cerebrovascular disease: Secondary | ICD-10-CM | POA: Diagnosis not present

## 2015-06-10 DIAGNOSIS — R5381 Other malaise: Secondary | ICD-10-CM | POA: Diagnosis not present

## 2015-06-10 DIAGNOSIS — I6350 Cerebral infarction due to unspecified occlusion or stenosis of unspecified cerebral artery: Secondary | ICD-10-CM | POA: Diagnosis not present

## 2015-06-10 LAB — BASIC METABOLIC PANEL
ANION GAP: 8 (ref 5–15)
BUN: 7 mg/dL (ref 6–20)
CHLORIDE: 109 mmol/L (ref 101–111)
CO2: 24 mmol/L (ref 22–32)
Calcium: 8.7 mg/dL — ABNORMAL LOW (ref 8.9–10.3)
Creatinine, Ser: 0.65 mg/dL (ref 0.44–1.00)
GFR calc Af Amer: >60 mL/min (ref >60)
GLUCOSE: 168 mg/dL — AB (ref 65–99)
POTASSIUM: 3.2 mmol/L — AB (ref 3.5–5.1)
Sodium: 141 mmol/L (ref 135–145)

## 2015-06-10 LAB — CBC WITH DIFFERENTIAL/PLATELET
BASOS ABS: 0 10*3/uL (ref 0.0–0.1)
Basophils Relative: 0 %
Eosinophils Absolute: 0 10*3/uL (ref 0.0–0.7)
Eosinophils Relative: 0 %
HEMATOCRIT: 34.8 % — AB (ref 36.0–46.0)
Hemoglobin: 10.7 g/dL — ABNORMAL LOW (ref 12.0–15.0)
LYMPHS ABS: 2.3 10*3/uL (ref 0.7–4.0)
LYMPHS PCT: 23 %
MCH: 27.8 pg (ref 26.0–34.0)
MCHC: 30.7 g/dL (ref 30.0–36.0)
MCV: 90.4 fL (ref 78.0–100.0)
MONO ABS: 1 10*3/uL (ref 0.1–1.0)
Monocytes Relative: 9 %
NEUTROS ABS: 6.9 10*3/uL (ref 1.7–7.7)
Neutrophils Relative %: 68 %
Platelets: 292 10*3/uL (ref 150–400)
RBC: 3.85 MIL/uL — ABNORMAL LOW (ref 3.87–5.11)
RDW: 14.4 % (ref 11.5–15.5)
WBC: 10.2 10*3/uL (ref 4.0–10.5)

## 2015-06-10 LAB — URINALYSIS, ROUTINE W REFLEX MICROSCOPIC
BILIRUBIN URINE: NEGATIVE
GLUCOSE, UA: NEGATIVE mg/dL
HGB URINE DIPSTICK: NEGATIVE
KETONES UR: NEGATIVE mg/dL
Nitrite: NEGATIVE
PROTEIN: NEGATIVE mg/dL
Specific Gravity, Urine: 1.02 (ref 1.005–1.030)
pH: 7 (ref 5.0–8.0)

## 2015-06-10 LAB — URINE MICROSCOPIC-ADD ON: SQUAMOUS EPITHELIAL / LPF: NONE SEEN

## 2015-06-10 LAB — TYPE AND SCREEN
ABO/RH(D): A POS
ANTIBODY SCREEN: NEGATIVE

## 2015-06-10 LAB — PROTIME-INR
INR: 1.79 — ABNORMAL HIGH (ref 0.00–1.49)
Prothrombin Time: 20.8 seconds — ABNORMAL HIGH (ref 11.6–15.2)

## 2015-06-10 LAB — ABO/RH: ABO/RH(D): A POS

## 2015-06-10 MED ORDER — FENTANYL CITRATE (PF) 100 MCG/2ML IJ SOLN
25.0000 ug | INTRAMUSCULAR | Status: AC | PRN
  Administered 2015-06-10 – 2015-06-11 (×2): 25 ug via INTRAVENOUS
  Filled 2015-06-10 (×4): qty 2

## 2015-06-10 NOTE — ED Notes (Signed)
Report given to [facility], RN on 5N.

## 2015-06-10 NOTE — ED Notes (Signed)
Call and spoke with [place].  [facility] reports truck should be there within the hour.

## 2015-06-10 NOTE — H&P (Signed)
[name] is an 69 y.o. female.   Chief Complaint: the right status post fall/right intertrochanteric displaced fracture [id number] is 24 year oldfemale with past medical history significant forleft anterior cerebral artery infarct in the setting of a 2 occlusion possible large vessel left from a  Possible embolic event moderateto high-grade stenosis of right A2 segment,patent foramen oval,  History of mild coronary artery disease, hypertension, diabetes mellitus, history of congestive heart failure secondary to preserved LV systolic function, and history of tobacco abuse, hyperlipidemia,history of recent lower GI bleeding secondary to stercoral rectal ulcers  [facility] [place] [facility] as patient had fall early Monday morning she rolled out of the bed had x-ray done today and was noted to have right intertrochanteric displaced femur fracture. Following CVA patient had right sided hemiparesis with aphasia.  Past Medical History  Diagnosis Date  . CHF (congestive heart failure) (HCC)     EF 01-77%, grade 1 diastolic dysfunction per echo 04/2015  . Diabetes ([place]) 2016    Type II. On insulin  . Hypertension   . CVA (cerebral infarction) 04/2015    Started Plavix 04/2015  . Patent foramen ovale 04/2015    Started on warfarin 04/2015  . Enchondroma of bone 2011    left femur.   . Stercoral ulcer of rectum 05/16/2015    Past Surgical History  Procedure Laterality Date  . Cardiac surgery      Cath without stent  . Tee without cardioversion N/A 05/05/2015    Procedure: TRANSESOPHAGEAL ECHOCARDIOGRAM (TEE);  Surgeon: Dixie Dials, MD;  Location: [facility] ENDOSCOPY;  Service: Cardiovascular;  Laterality: N/A;  . Colonoscopy N/A 05/15/2015    Procedure: COLONOSCOPY;  Surgeon: Mauri Pole, MD;  Location: [facility] ENDOSCOPY;  Service: Endoscopy;  Laterality: N/A;  . Flexible sigmoidoscopy N/A 05/15/2015    Procedure: FLEXIBLE SIGMOIDOSCOPY;   Surgeon: Mauri Pole, MD;  Location: [facility] ENDOSCOPY;  Service: Endoscopy;  Laterality: N/A;  at bedside    Family History  Problem Relation Age of Onset  . Hypertension Mother   . Heart failure Mother   . Hyperlipidemia Mother    Social History:  reports that she has been smoking Cigarettes.  She does not have any smokeless tobacco history on file. She reports that she does not drink alcohol or use illicit drugs.  Allergies: No Known Allergies   (Not in a hospital admission)  Results for orders placed or performed during the hospital encounter of 06/10/15 (from the past 48 hour(s))  Basic metabolic panel     Status: Abnormal   Collection Time: 06/10/15  6:46 PM  Result Value Ref Range   Sodium 141 135 - 145 mmol/L   Potassium 3.2 (L) 3.5 - 5.1 mmol/L   Chloride 109 101 - 111 mmol/L   CO2 24 22 - 32 mmol/L   Glucose, Bld 168 (H) 65 - 99 mg/dL   BUN 7 6 - 20 mg/dL   Creatinine, Ser 0.65 0.44 - 1.00 mg/dL   Calcium 8.7 (L) 8.9 - 10.3 mg/dL   GFR calc non Af Amer >60 >60 mL/min   GFR calc Af Amer >60 >60 mL/min    Comment: (NOTE) The eGFR has been calculated using the CKD EPI equation. This calculation has not been validated in all clinical situations. eGFR's persistently <60 mL/min signify possible Chronic Kidney Disease.    Anion gap 8 5 - 15  CBC WITH DIFFERENTIAL     Status: Abnormal  Collection Time: 06/10/15  6:46 PM  Result Value Ref Range   WBC 10.2 4.0 - 10.5 K/uL   RBC 3.85 (L) 3.87 - 5.11 MIL/uL   Hemoglobin 10.7 (L) 12.0 - 15.0 g/dL   HCT 34.8 (L) 36.0 - 46.0 %   MCV 90.4 78.0 - 100.0 fL   MCH 27.8 26.0 - 34.0 pg   MCHC 30.7 30.0 - 36.0 g/dL   RDW 14.4 11.5 - 15.5 %   Platelets 292 150 - 400 K/uL   Neutrophils Relative % 68 %   Neutro Abs 6.9 1.7 - 7.7 K/uL   Lymphocytes Relative 23 %   Lymphs Abs 2.3 0.7 - 4.0 K/uL   Monocytes Relative 9 %   Monocytes Absolute 1.0 0.1 - 1.0 K/uL   Eosinophils Relative 0 %   Eosinophils Absolute 0.0 0.0 - 0.7 K/uL    Basophils Relative 0 %   Basophils Absolute 0.0 0.0 - 0.1 K/uL  Protime-INR     Status: Abnormal   Collection Time: 06/10/15  6:46 PM  Result Value Ref Range   Prothrombin Time 20.8 (H) 11.6 - 15.2 seconds   INR 1.79 (H) 0.00 - 1.49  Type and screen [place]     Status: None   Collection Time: 06/10/15  6:46 PM  Result Value Ref Range   ABO/RH(D) A POS    Antibody Screen NEG    Sample Expiration 06/13/2015    Dg Chest 1 View  06/10/2015  CLINICAL DATA:  Fall, diabetes, hypertension, right hip intertrochanteric fracture EXAM: CHEST 1 VIEW COMPARISON:  05/16/2015 FINDINGS: Limited AP supine exam. This accounts for prominence of the mediastinum. Mild cardiomegaly with central vascular congestion. Low lung volumes evident. Trachea is midline. No focal pneumonia or CHF. No effusion or pneumothorax. Bones are osteopenic. Degenerative changes of the spine with a mild scoliosis. IMPRESSION: Stable low volume chest exam. Electronically Signed   By: Jerilynn Mages.  Shick M.D.   On: 06/10/2015 19:30   Ct Head Wo Contrast  06/10/2015  CLINICAL DATA:  Rolled out of bed Monday morning. Femur fracture. Possible femur. Neck pain. EXAM: CT HEAD WITHOUT CONTRAST CT CERVICAL SPINE WITHOUT CONTRAST TECHNIQUE: Multidetector CT imaging of the head and cervical spine was performed following the standard protocol without intravenous contrast. Multiplanar CT image reconstructions of the cervical spine were also generated. COMPARISON:  Multiple exams, including 05/01/2015 FINDINGS: CT HEAD FINDINGS Left anterior cerebral artery distribution hypodensity tracking anterior and above the left lateral ventricle similar to distribution on prior MRI, compatible with encephalomalacia. Otherwise the cerebellum, brainstem, cerebral peduncles, thalami, and basal ganglia appear unremarkable. Basilar cisterns unremarkable. No intracranial hemorrhage, mass lesion, or acute CVA. CT CERVICAL SPINE FINDINGS Congenital interbody  and posterior element fusion at C2-3. 2.5 mm degenerative retrolisthesis at C3-4 with uncinate and facet spurring causing osseous foraminal stenosis at C3-4. The facet arthropathy on the left at C4-5 causes mild to moderate left foraminal stenosis. No cervical spine fracture or acute cervical spine findings. Incidental note is made of failure of fusion of the posterior arch of C1. There is moderate central narrowing of the thecal sac at C3-4 due to a central disc protrusion. Smaller disc protrusion centrally at C4-5 without impingement. IMPRESSION: 1. Evolutionary findings in the prior left anterior cerebral artery distribution infarct. No significant new/acute intracranial findings. 2. No acute cervical spine findings. Cervical spondylosis and degenerative disc disease contribute to impingement at C3-4 and C4-5 as detailed above. 3. Congenital interbody and posterior element fusion at  C2- 3. Failure of fusion of the posterior arch of C1. Electronically Signed   By: Van Clines M.D.   On: 06/10/2015 19:58   Ct Cervical Spine Wo Contrast  06/10/2015  CLINICAL DATA:  Rolled out of bed Monday morning. Femur fracture. Possible femur. Neck pain. EXAM: CT HEAD WITHOUT CONTRAST CT CERVICAL SPINE WITHOUT CONTRAST TECHNIQUE: Multidetector CT imaging of the head and cervical spine was performed following the standard protocol without intravenous contrast. Multiplanar CT image reconstructions of the cervical spine were also generated. COMPARISON:  Multiple exams, including 05/01/2015 FINDINGS: CT HEAD FINDINGS Left anterior cerebral artery distribution hypodensity tracking anterior and above the left lateral ventricle similar to distribution on prior MRI, compatible with encephalomalacia. Otherwise the cerebellum, brainstem, cerebral peduncles, thalami, and basal ganglia appear unremarkable. Basilar cisterns unremarkable. No intracranial hemorrhage, mass lesion, or acute CVA. CT CERVICAL SPINE FINDINGS Congenital  interbody and posterior element fusion at C2-3. 2.5 mm degenerative retrolisthesis at C3-4 with uncinate and facet spurring causing osseous foraminal stenosis at C3-4. The facet arthropathy on the left at C4-5 causes mild to moderate left foraminal stenosis. No cervical spine fracture or acute cervical spine findings. Incidental note is made of failure of fusion of the posterior arch of C1. There is moderate central narrowing of the thecal sac at C3-4 due to a central disc protrusion. Smaller disc protrusion centrally at C4-5 without impingement. IMPRESSION: 1. Evolutionary findings in the prior left anterior cerebral artery distribution infarct. No significant new/acute intracranial findings. 2. No acute cervical spine findings. Cervical spondylosis and degenerative disc disease contribute to impingement at C3-4 and C4-5 as detailed above. 3. 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 Failure of fusion of the posterior arch of C1. Electronically Signed   By: Van Clines M.D.   On: 06/10/2015 19:58   Dg Femur, Min 2 Views Right  06/10/2015  CLINICAL DATA:  Fall with neck pain.  Initial encounter. EXAM: RIGHT FEMUR 2 VIEWS COMPARISON:  None. FINDINGS: Intertrochanteric right femur fracture with varus angulation and rotation. The right hip is located. Marked osteopenia. There is loosely organized medullary arc and ring calcification at the level of the distal femur compatible with enchondroma. No aggressive features noted. Atherosclerosis. IMPRESSION: Displaced intertrochanteric right femur fracture. Electronically Signed   By: Monte Fantasia M.D.   On: 06/10/2015 19:29    Review of Systems  Unable to perform ROS: patient nonverbal    Blood pressure 152/92, pulse 62, temperature 99.1 F (37.3 C), temperature source Oral, resp. rate 18, SpO2 95 %. Physical Exam  Eyes: Conjunctivae are normal. Left eye exhibits no discharge. No scleral icterus.  Neck: Normal range of motion. Neck  supple. No JVD present. No tracheal deviation present. No thyromegaly present.  Cardiovascular: Normal rate and regular rhythm.   Murmur (Soft systolic murmur noted no S3 gallop) heard. Respiratory: Effort normal and breath sounds normal. No respiratory distress. She has no wheezes. She has no rales.  GI: Soft. Bowel sounds are normal. She exhibits no distension. There is no tenderness.  Musculoskeletal: She exhibits no edema or tenderness.  Shortening and external rotation of right lower extremity noted  Neurological: She is alert.  Right-sided paresis noted as before     Assessment/Plan Right intertrochanteric displaced femur  Fracture status post fall History of recent left CVA with right paresis  Mild coronary artery disease Hypertension Diabetes mellitus Hyperlipidemia Compensated congestive heart failure secondary to preserved LV systolic function Patent for him and oval History of tobacco  abuse in the past Plan Restart home medications Except Coumadin Check labs in a.m. Orthopedic consultation   Charolette Forward 06/10/2015, 9:17 PM

## 2015-06-10 NOTE — ED Notes (Signed)
Per EMS, pt from [facility]   Pt fell Monday morning by rolling out of bed.  Facility placed patient back in bed.  Pt had xray today and confirmed femur fracture.  Pt also warm to touch with possible fever.  Oriented to baseline.  Obvious rotation to rt leg.  Vitals:188/106, hr 72, resp 18, 97% ra, cbg 243

## 2015-06-10 NOTE — ED Notes (Signed)
Good CMS on Lt leg.  Good pulses and color on Rt leg but very little movement.  Pt doesn't follow commands.

## 2015-06-10 NOTE — ED Provider Notes (Signed)
CSN: [id number]     Arrival date & time 06/10/15  1756 History   First MD Initiated Contact with Patient 06/10/15 1808     Chief Complaint  Patient presents with  . Hip Injury  . Fever     (Consider location/radiation/quality/duration/timing/severity/associated sxs/prior Treatment) HPI Comments: Patient sent over here from nursing facility via EMS for a right hip fracture. Per the nursing home, and she rolled out of bed yesterday. The staff found her on the floor and put her back in bed. She was complaining of increased hip pain today. They did x-rays of her right hip and right knee. The right knee films were negative. The x-rays of the right hip showed an intertrochanteric fracture. She was sent over here for further evaluation. She has a past history of coronary artery disease, CHF, diabetes, hypertension.  She had a recent left ACA distribution stroke and was discharged on November 1. She has limited communication secondary to this. She was also admitted on November 10 for a GI bleed. Of note she is on Coumadin. The nursing home staff did report that the patient felt warm.  Patient is a 69 y.o. female presenting with fever.  Fever   Past Medical History  Diagnosis Date  . CHF (congestive heart failure) (HCC)     EF 99991111, grade 1 diastolic dysfunction per echo 04/2015  . Diabetes ([place]) 2016    Type II. On insulin  . Hypertension   . CVA (cerebral infarction) 04/2015    Started Plavix 04/2015  . Patent foramen ovale 04/2015    Started on warfarin 04/2015  . Enchondroma of bone 2011    left femur.   . Stercoral ulcer of rectum 05/16/2015   Past Surgical History  Procedure Laterality Date  . Cardiac surgery      Cath without stent  . Tee without cardioversion N/A 05/05/2015    Procedure: TRANSESOPHAGEAL ECHOCARDIOGRAM (TEE);  Surgeon: Dixie Dials, MD;  Location: [facility] ENDOSCOPY;  Service: Cardiovascular;  Laterality: N/A;  . Colonoscopy N/A 05/15/2015    Procedure: COLONOSCOPY;   Surgeon: Mauri Pole, MD;  Location: [facility] ENDOSCOPY;  Service: Endoscopy;  Laterality: N/A;  . Flexible sigmoidoscopy N/A 05/15/2015    Procedure: FLEXIBLE SIGMOIDOSCOPY;  Surgeon: Mauri Pole, MD;  Location: [facility] ENDOSCOPY;  Service: Endoscopy;  Laterality: N/A;  at bedside   Family History  Problem Relation Age of Onset  . Hypertension Mother   . Heart failure Mother   . Hyperlipidemia Mother    Social History  Substance Use Topics  . Smoking status: Current Every Day Smoker    Types: Cigarettes  . Smokeless tobacco: None  . Alcohol Use: No   OB History    No data available     Review of Systems  Unable to perform ROS: Patient nonverbal      Allergies  Review of patient's allergies indicates no known allergies.  Home Medications   Prior to Admission medications   Medication Sig Start Date End Date Taking? Authorizing Provider  acetaminophen (TYLENOL) 325 MG tablet Take 325 mg by mouth every 6 (six) hours as needed for mild pain.    Yes Historical Provider, MD  atorvastatin (LIPITOR) 40 MG tablet Take 1 tablet (40 mg total) by mouth daily. 05/06/15  Yes Charolette Forward, MD  ferrous sulfate 325 (65 FE) MG tablet Take 1 tablet (325 mg total) by mouth 3 (three) times daily with meals. 05/21/15  Yes Charolette Forward, MD  HYDROcodone-acetaminophen (NORCO/VICODIN) 5-325 MG tablet Take  1 tablet by mouth every 6 (six) hours as needed for moderate pain or severe pain.   Yes Historical Provider, MD  insulin glargine (LANTUS) 100 UNIT/ML injection Inject 0.05 mLs (5 Units total) into the skin at bedtime. Patient taking differently: Inject 10 Units into the skin daily.  05/21/15  Yes Charolette Forward, MD  insulin lispro (HUMALOG) 100 UNIT/ML injection Inject 0-10 Units into the skin 3 (three) times daily before meals. 0-150=0 151-200=2 201-250=3 251-300=4 301-350=6 351-400=8 401-402=10 & notify MD   Yes Historical Provider, MD  metoprolol succinate (TOPROL-XL) 25 MG 24 hr tablet  Take 1 tablet (25 mg total) by mouth daily. 05/21/15  Yes Charolette Forward, MD  miconazole (MICOTIN) 2 % cream Apply 1 application topically daily as needed (rash).    Yes Historical Provider, MD  nitroGLYCERIN (NITROSTAT) 0.4 MG SL tablet Place 0.4 mg under the tongue every 5 (five) minutes as needed for chest pain.   Yes Historical Provider, MD  pantoprazole (PROTONIX) 40 MG tablet Take 1 tablet (40 mg total) by mouth daily at 6 (six) AM. 11/14/12  Yes Charolette Forward, MD  polyethylene glycol (MIRALAX / GLYCOLAX) packet Take 17 g by mouth daily. Patient taking differently: Take 17 g by mouth daily as needed for mild constipation or moderate constipation.  05/21/15  Yes Charolette Forward, MD  traMADol (ULTRAM) 50 MG tablet Take 50 mg by mouth every 8 (eight) hours as needed for moderate pain or severe pain.   Yes Historical Provider, MD  warfarin (COUMADIN) 5 MG tablet Take 5-7.5 mg by mouth See admin instructions. Take 5 mg SUN,TUES,WED,FRI,SAT & 7.5 mg MON & THURS   Yes Historical Provider, MD  warfarin (COUMADIN) 3 MG tablet Take 1 tablet (3 mg total) by mouth daily. Patient not taking: Reported on 06/10/2015 05/21/15   Charolette Forward, MD   BP 152/92 mmHg  Pulse 62  Temp(Src) 99.1 F (37.3 C) (Oral)  Resp 18  SpO2 95% Physical Exam  Constitutional: She is oriented to person, place, and time. She appears well-developed and well-nourished.  HENT:  Head: Normocephalic and atraumatic.  Eyes: Pupils are equal, round, and reactive to light.  Neck:  Patient seems to have some discomfort on palpation of the cervical spine. I don't elicit any discomfort of the thoracic or lumbosacral spine.  Cardiovascular: Normal rate, regular rhythm and normal heart sounds.   Pulmonary/Chest: Effort normal and breath sounds normal. No respiratory distress. She has no wheezes. She has no rales. She exhibits no tenderness.  Abdominal: Soft. Bowel sounds are normal. There is no tenderness. There is no rebound and no  guarding.  Musculoskeletal: Normal range of motion. She exhibits no edema.  There is external rotation of the right leg. Pedal pulses are intact. There is pain on range of motion of the right hip. There is no pain on palpation or range of motion of the knee or the ankle.  Lymphadenopathy:    She has no cervical adenopathy.  Neurological: She is alert and oriented to person, place, and time.  Patient has a flaccid right upper and lower extremity. She has very limited speech ability. She is unable to answer my questions. She is awake and alert.  Skin: Skin is warm and dry. No rash noted.  Psychiatric: She has a normal mood and affect.    ED Course  Procedures (including critical care time) Labs Review Labs Reviewed  BASIC METABOLIC PANEL - Abnormal; Notable for the following:    Potassium 3.2 (*)  Glucose, Bld 168 (*)    Calcium 8.7 (*)    All other components within normal limits  CBC WITH DIFFERENTIAL/PLATELET - Abnormal; Notable for the following:    RBC 3.85 (*)    Hemoglobin 10.7 (*)    HCT 34.8 (*)    All other components within normal limits  PROTIME-INR - Abnormal; Notable for the following:    Prothrombin Time 20.8 (*)    INR 1.79 (*)    All other components within normal limits  URINALYSIS, ROUTINE W REFLEX MICROSCOPIC (NOT AT [facility])  TYPE AND SCREEN  ABO/RH    Imaging Review Dg Chest 1 View  06/10/2015  CLINICAL DATA:  Fall, diabetes, hypertension, right hip intertrochanteric fracture EXAM: CHEST 1 VIEW COMPARISON:  05/16/2015 FINDINGS: Limited AP supine exam. This accounts for prominence of the mediastinum. Mild cardiomegaly with central vascular congestion. Low lung volumes evident. Trachea is midline. No focal pneumonia or CHF. No effusion or pneumothorax. Bones are osteopenic. Degenerative changes of the spine with a mild scoliosis. IMPRESSION: Stable low volume chest exam. Electronically Signed   By: Jerilynn Mages.  Shick M.D.   On: 06/10/2015 19:30   Ct Head Wo  Contrast  06/10/2015  CLINICAL DATA:  Rolled out of bed Monday morning. Femur fracture. Possible femur. Neck pain. EXAM: CT HEAD WITHOUT CONTRAST CT CERVICAL SPINE WITHOUT CONTRAST TECHNIQUE: Multidetector CT imaging of the head and cervical spine was performed following the standard protocol without intravenous contrast. Multiplanar CT image reconstructions of the cervical spine were also generated. COMPARISON:  Multiple exams, including 05/01/2015 FINDINGS: CT HEAD FINDINGS Left anterior cerebral artery distribution hypodensity tracking anterior and above the left lateral ventricle similar to distribution on prior MRI, compatible with encephalomalacia. Otherwise the cerebellum, brainstem, cerebral peduncles, thalami, and basal ganglia appear unremarkable. Basilar cisterns unremarkable. No intracranial hemorrhage, mass lesion, or acute CVA. 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 2.5 mm degenerative retrolisthesis at C3-4 with uncinate and facet spurring causing osseous foraminal stenosis at C3-4. The facet arthropathy on the left at C4-5 causes mild to moderate left foraminal stenosis. No cervical spine fracture or acute cervical spine findings. Incidental note is made of failure of fusion of the posterior arch of C1. There is moderate central narrowing of the thecal sac at C3-4 due to a central disc protrusion. Smaller disc protrusion centrally at C4-5 without impingement. IMPRESSION: 1. Evolutionary findings in the prior left anterior cerebral artery distribution infarct. No significant new/acute intracranial findings. 2. No acute cervical spine findings. Cervical spondylosis and degenerative disc disease contribute to impingement at C3-4 and C4-5 as detailed above. 3. 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 Failure of fusion of the posterior arch of C1. Electronically Signed   By: Van Clines M.D.   On: 06/10/2015 19:58   Ct Cervical  Spine Wo Contrast  06/10/2015  CLINICAL DATA:  Rolled out of bed Monday morning. Femur fracture. Possible femur. Neck pain. EXAM: CT HEAD WITHOUT CONTRAST CT CERVICAL SPINE WITHOUT CONTRAST TECHNIQUE: Multidetector CT imaging of the head and cervical spine was performed following the standard protocol without intravenous contrast. Multiplanar CT image reconstructions of the cervical spine were also generated. COMPARISON:  Multiple exams, including 05/01/2015 FINDINGS: CT HEAD FINDINGS Left anterior cerebral artery distribution hypodensity tracking anterior and above the left lateral ventricle similar to distribution on prior MRI, compatible with encephalomalacia. Otherwise the cerebellum, brainstem, cerebral peduncles, thalami, and basal ganglia appear unremarkable. Basilar cisterns unremarkable. No intracranial hemorrhage, mass  lesion, or acute CVA. 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 2.5 mm degenerative retrolisthesis at C3-4 with uncinate and facet spurring causing osseous foraminal stenosis at C3-4. The facet arthropathy on the left at C4-5 causes mild to moderate left foraminal stenosis. No cervical spine fracture or acute cervical spine findings. Incidental note is made of failure of fusion of the posterior arch of C1. There is moderate central narrowing of the thecal sac at C3-4 due to a central disc protrusion. Smaller disc protrusion centrally at C4-5 without impingement. IMPRESSION: 1. Evolutionary findings in the prior left anterior cerebral artery distribution infarct. No significant new/acute intracranial findings. 2. No acute cervical spine findings. Cervical spondylosis and degenerative disc disease contribute to impingement at C3-4 and C4-5 as detailed above. 3. 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 Failure of fusion of the posterior arch of C1. Electronically Signed   By: Van Clines M.D.   On: 06/10/2015 19:58   Dg  Femur, Min 2 Views Right  06/10/2015  CLINICAL DATA:  Fall with neck pain.  Initial encounter. EXAM: RIGHT FEMUR 2 VIEWS COMPARISON:  None. FINDINGS: Intertrochanteric right femur fracture with varus angulation and rotation. The right hip is located. Marked osteopenia. There is loosely organized medullary arc and ring calcification at the level of the distal femur compatible with enchondroma. No aggressive features noted. Atherosclerosis. IMPRESSION: Displaced intertrochanteric right femur fracture. Electronically Signed   By: Monte Fantasia M.D.   On: 06/10/2015 19:29   I have personally reviewed and evaluated these images and lab results as part of my medical decision-making.   EKG Interpretation   Date/Time:  Tuesday June 10 2015 20:57:06 EST Ventricular Rate:  62 PR Interval:  197 QRS Duration: 100 QT Interval:  459 QTC Calculation: 466 R Axis:   -54 Text Interpretation:  Sinus rhythm LAE, consider biatrial enlargement LAD,  consider left anterior fascicular block Probable posterior infarct, recent  Confirmed by Ranald Alessio  MD, Gregrey Bloyd ([id number]) on 06/10/2015 9:42:47 PM      MDM   Final diagnoses:  Hip fracture, right, closed, initial encounter [facility])    Patient has a right intertrochanteric hip fracture. There is no other evident injuries from the fall. Her INR is 1.79. I spoke with Dr. Percell Miller who is on-call for orthopedic surgery. He recommends patient be transferred to [facility] for operative repair of the hip fracture. I spoke with patient's PCP, Dr. Terrence Dupont who is coming in to see the patient for admission. Dr. Percell Miller advised patient remain nothing by mouth after midnight for possible surgery tomorrow.    Malvin Johns, MD 06/10/15 2142

## 2015-06-10 NOTE — ED Notes (Signed)
+  CMS on Lt leg, Pulses & sensation WNL on Rt leg but no movement on Rt on her own.

## 2015-06-10 NOTE — Progress Notes (Signed)
Pt's son's phone numbers: [name] [phone] & [name] [phone].  They are next of [facility].

## 2015-06-10 NOTE — ED Notes (Signed)
Bed: [id number] Expected date:  Expected time:  Means of arrival:  Comments: Elderly fever

## 2015-06-11 LAB — BASIC METABOLIC PANEL
Anion gap: 9 (ref 5–15)
CO2: 24 mmol/L (ref 22–32)
CREATININE: 0.6 mg/dL (ref 0.44–1.00)
Calcium: 8.8 mg/dL — ABNORMAL LOW (ref 8.9–10.3)
Chloride: 107 mmol/L (ref 101–111)
GFR calc Af Amer: >60 mL/min (ref >60)
Glucose, Bld: 147 mg/dL — ABNORMAL HIGH (ref 65–99)
Potassium: 3.4 mmol/L — ABNORMAL LOW (ref 3.5–5.1)
SODIUM: 140 mmol/L (ref 135–145)

## 2015-06-11 LAB — CBC
HCT: 34.6 % — ABNORMAL LOW (ref 36.0–46.0)
Hemoglobin: 10.8 g/dL — ABNORMAL LOW (ref 12.0–15.0)
MCH: 28.1 pg (ref 26.0–34.0)
MCHC: 31.2 g/dL (ref 30.0–36.0)
MCV: 89.9 fL (ref 78.0–100.0)
PLATELETS: 262 10*3/uL (ref 150–400)
RBC: 3.85 MIL/uL — ABNORMAL LOW (ref 3.87–5.11)
RDW: 14.5 % (ref 11.5–15.5)
WBC: 9.7 10*3/uL (ref 4.0–10.5)

## 2015-06-11 LAB — GLUCOSE, CAPILLARY
GLUCOSE-CAPILLARY: 141 mg/dL — AB (ref 65–99)
GLUCOSE-CAPILLARY: 127 mg/dL — AB (ref 65–99)
GLUCOSE-CAPILLARY: 176 mg/dL — AB (ref 65–99)
Glucose-Capillary: 130 mg/dL — ABNORMAL HIGH (ref 65–99)
Glucose-Capillary: 188 mg/dL — ABNORMAL HIGH (ref 65–99)

## 2015-06-11 LAB — PROTIME-INR
INR: 1.88 — ABNORMAL HIGH (ref 0.00–1.49)
INR: 2.01 — AB (ref 0.00–1.49)
PROTHROMBIN TIME: 22.7 s — AB (ref 11.6–15.2)
Prothrombin Time: 21.5 seconds — ABNORMAL HIGH (ref 11.6–15.2)

## 2015-06-11 LAB — SURGICAL PCR SCREEN
MRSA, PCR: NEGATIVE
Staphylococcus aureus: POSITIVE — AB

## 2015-06-11 MED ORDER — PANTOPRAZOLE SODIUM 40 MG PO TBEC
40.0000 mg | DELAYED_RELEASE_TABLET | Freq: Every day | ORAL | Status: DC
  Administered 2015-06-11 – 2015-06-16 (×6): 40 mg via ORAL
  Filled 2015-06-11 (×7): qty 1

## 2015-06-11 MED ORDER — ACETAMINOPHEN 500 MG PO TABS: 1000.0000 mg | ORAL_TABLET | Freq: Once | ORAL | Status: DC

## 2015-06-11 MED ORDER — POTASSIUM CHLORIDE CRYS ER 20 MEQ PO TBCR
40.0000 meq | EXTENDED_RELEASE_TABLET | Freq: Once | ORAL | Status: AC
  Administered 2015-06-11: 40 meq via ORAL
  Filled 2015-06-11: qty 2

## 2015-06-11 MED ORDER — ACETAMINOPHEN 325 MG PO TABS
325.0000 mg | ORAL_TABLET | Freq: Four times a day (QID) | ORAL | Status: DC | PRN
  Administered 2015-06-11: 325 mg via ORAL
  Filled 2015-06-11: qty 1

## 2015-06-11 MED ORDER — ACETAMINOPHEN 500 MG PO TABS
1000.0000 mg | ORAL_TABLET | ORAL | Status: AC
  Administered 2015-06-12: 1000 mg via ORAL
  Filled 2015-06-11: qty 2

## 2015-06-11 MED ORDER — ACETAMINOPHEN 500 MG PO TABS: 1000.0000 mg | ORAL_TABLET | ORAL | Status: DC

## 2015-06-11 MED ORDER — HYDROCODONE-ACETAMINOPHEN 5-325 MG PO TABS
1.0000 | ORAL_TABLET | Freq: Four times a day (QID) | ORAL | Status: DC | PRN
  Administered 2015-06-11 – 2015-06-16 (×16): 1 via ORAL
  Filled 2015-06-11 (×17): qty 1

## 2015-06-11 MED ORDER — CEFAZOLIN SODIUM-DEXTROSE 2-3 GM-% IV SOLR
2.0000 g | INTRAVENOUS | Status: AC
  Administered 2015-06-12: 2 g via INTRAVENOUS
  Filled 2015-06-11: qty 50

## 2015-06-11 MED ORDER — CHLORHEXIDINE GLUCONATE 4 % EX LIQD: 60.0000 mL | CUTANEOUS | Status: DC

## 2015-06-11 MED ORDER — CHLORHEXIDINE GLUCONATE 4 % EX LIQD
60.0000 mL | CUTANEOUS | Status: AC
  Administered 2015-06-12: 4 via TOPICAL

## 2015-06-11 MED ORDER — ATORVASTATIN CALCIUM 40 MG PO TABS
40.0000 mg | ORAL_TABLET | Freq: Every day | ORAL | Status: DC
  Administered 2015-06-11 – 2015-06-16 (×5): 40 mg via ORAL
  Filled 2015-06-11 (×5): qty 1

## 2015-06-11 MED ORDER — METOPROLOL SUCCINATE ER 25 MG PO TB24
25.0000 mg | ORAL_TABLET | Freq: Every day | ORAL | Status: DC
  Administered 2015-06-11 – 2015-06-16 (×5): 25 mg via ORAL
  Filled 2015-06-11 (×5): qty 1

## 2015-06-11 MED ORDER — INSULIN ASPART 100 UNIT/ML ~~LOC~~ SOLN
0.0000 [IU] | Freq: Three times a day (TID) | SUBCUTANEOUS | Status: DC
  Administered 2015-06-11 (×2): 1 [IU] via SUBCUTANEOUS
  Administered 2015-06-11 – 2015-06-13 (×3): 2 [IU] via SUBCUTANEOUS
  Administered 2015-06-13: 3 [IU] via SUBCUTANEOUS
  Administered 2015-06-13: 1 [IU] via SUBCUTANEOUS
  Administered 2015-06-14: 3 [IU] via SUBCUTANEOUS
  Administered 2015-06-14: 5 [IU] via SUBCUTANEOUS
  Administered 2015-06-14 – 2015-06-15 (×2): 3 [IU] via SUBCUTANEOUS

## 2015-06-11 MED ORDER — HEPARIN SODIUM (PORCINE) 5000 UNIT/ML IJ SOLN
5000.0000 [IU] | Freq: Three times a day (TID) | INTRAMUSCULAR | Status: DC
Start: 2015-06-11 — End: 2015-06-12
  Administered 2015-06-11 (×2): 5000 [IU] via SUBCUTANEOUS
  Filled 2015-06-11 (×3): qty 1

## 2015-06-11 MED ORDER — POTASSIUM CHLORIDE 10 MEQ/100ML IV SOLN
10.0000 meq | INTRAVENOUS | Status: AC
  Administered 2015-06-11 (×3): 10 meq via INTRAVENOUS
  Filled 2015-06-11 (×3): qty 100

## 2015-06-11 MED ORDER — POLYETHYLENE GLYCOL 3350 17 G PO PACK
17.0000 g | PACK | Freq: Every day | ORAL | Status: DC | PRN
  Administered 2015-06-13 – 2015-06-16 (×2): 17 g via ORAL
  Filled 2015-06-11 (×2): qty 1

## 2015-06-11 MED ORDER — FERROUS SULFATE 325 (65 FE) MG PO TABS
325.0000 mg | ORAL_TABLET | Freq: Three times a day (TID) | ORAL | Status: DC
  Administered 2015-06-11 – 2015-06-16 (×15): 325 mg via ORAL
  Filled 2015-06-11 (×15): qty 1

## 2015-06-11 MED ORDER — TRAMADOL HCL 50 MG PO TABS
50.0000 mg | ORAL_TABLET | Freq: Three times a day (TID) | ORAL | Status: DC | PRN
  Administered 2015-06-11 – 2015-06-14 (×6): 50 mg via ORAL
  Filled 2015-06-11 (×7): qty 1

## 2015-06-11 MED ORDER — SODIUM CHLORIDE 0.9 % IV SOLN
INTRAVENOUS | Status: DC
  Administered 2015-06-11 – 2015-06-12 (×2): via INTRAVENOUS

## 2015-06-11 NOTE — Progress Notes (Deleted)
ORTHOPAEDIC CONSULTATION  REQUESTING PHYSICIAN: Mohan Harwani, MD  Chief Complaint: R hip fracture  HPI: [name] is a 69 y.o. female who complains of R hip pain after a mechanical fall from her bed to the floor.  The staff at her facility found her on the floor. She complained of increasing R hip pain, so they transferred her via EMS to the ED for evaluation.  In the ED she was found to have an intertrochanteric fracture of the R hip.  She was then transferred to [place] for further evaluation.    The patient has a hx of a recent stroke affecting her R side.  At baseline she is very flaccid on the R side with impair speech. She was started on Coumadin.  On  November 10, she was found to have a GI bleed.  She also has a hx of CAD, DM, HTN.   Past Medical History  Diagnosis Date  . CHF (congestive heart failure) (HCC)     EF 50-55%, grade 1 diastolic dysfunction per echo 04/2015  . Diabetes (HCC) 2016    Type II. On insulin  . Hypertension   . CVA (cerebral infarction) 04/2015    Started Plavix 04/2015  . Patent foramen ovale 04/2015    Started on warfarin 04/2015  . Enchondroma of bone 2011    left femur.   . Stercoral ulcer of rectum 05/16/2015   Past Surgical History  Procedure Laterality Date  . Cardiac surgery      Cath without stent  . Tee without cardioversion N/A 05/05/2015    Procedure: TRANSESOPHAGEAL ECHOCARDIOGRAM (TEE);  Surgeon: Ajay Kadakia, MD;  Location: [facility];  Service: Cardiovascular;  Laterality: N/A;  . Colonoscopy N/A 05/15/2015    Procedure: COLONOSCOPY;  Surgeon: Kavitha Nandigam V, MD;  Location: [facility];  Service: Endoscopy;  Laterality: N/A;  . Flexible sigmoidoscopy N/A 05/15/2015    Procedure: FLEXIBLE SIGMOIDOSCOPY;  Surgeon: Kavitha Nandigam V, MD;  Location: [facility];  Service: Endoscopy;  Laterality: N/A;  at bedside   Social History   Social History  . Marital Status: Married    Spouse Name: N/A  . Number of  Children: N/A  . Years of Education: N/A   Social History Main Topics  . Smoking status: Current Every Day Smoker    Types: Cigarettes  . Smokeless tobacco: None  . Alcohol Use: No  . Drug Use: No  . Sexual Activity: Not Asked   Other Topics Concern  . None   Social History Narrative   Family History  Problem Relation Age of Onset  . Hypertension Mother   . Heart failure Mother   . Hyperlipidemia Mother    No Known Allergies Prior to Admission medications   Medication Sig Start Date End Date Taking? Authorizing Provider  acetaminophen (TYLENOL) 325 MG tablet Take 325 mg by mouth every 6 (six) hours as needed for mild pain.    Yes Historical Provider, MD  atorvastatin (LIPITOR) 40 MG tablet Take 1 tablet (40 mg total) by mouth daily. 05/06/15  Yes Mohan Harwani, MD  ferrous sulfate 325 (65 FE) MG tablet Take 1 tablet (325 mg total) by mouth 3 (three) times daily with meals. 05/21/15  Yes Mohan Harwani, MD  HYDROcodone-acetaminophen (NORCO/VICODIN) 5-325 MG tablet Take 1 tablet by mouth every 6 (six) hours as needed for moderate pain or severe pain.   Yes Historical Provider, MD  insulin glargine (LANTUS) 100 UNIT/ML injection Inject 0.05 mLs (5 Units total) into the   skin at bedtime. Patient taking differently: Inject 10 Units into the skin daily.  05/21/15  Yes Mohan Harwani, MD  insulin lispro (HUMALOG) 100 UNIT/ML injection Inject 0-10 Units into the skin 3 (three) times daily before meals. 0-150=0 151-200=2 201-250=3 251-300=4 301-350=6 351-400=8 401-402=10 & notify MD   Yes Historical Provider, MD  metoprolol succinate (TOPROL-XL) 25 MG 24 hr tablet Take 1 tablet (25 mg total) by mouth daily. 05/21/15  Yes Mohan Harwani, MD  miconazole (MICOTIN) 2 % cream Apply 1 application topically daily as needed (rash).    Yes Historical Provider, MD  nitroGLYCERIN (NITROSTAT) 0.4 MG SL tablet Place 0.4 mg under the tongue every 5 (five) minutes as needed for chest pain.   Yes  Historical Provider, MD  pantoprazole (PROTONIX) 40 MG tablet Take 1 tablet (40 mg total) by mouth daily at 6 (six) AM. 11/14/12  Yes Mohan Harwani, MD  polyethylene glycol (MIRALAX / GLYCOLAX) packet Take 17 g by mouth daily. Patient taking differently: Take 17 g by mouth daily as needed for mild constipation or moderate constipation.  05/21/15  Yes Mohan Harwani, MD  traMADol (ULTRAM) 50 MG tablet Take 50 mg by mouth every 8 (eight) hours as needed for moderate pain or severe pain.   Yes Historical Provider, MD  warfarin (COUMADIN) 5 MG tablet Take 5-7.5 mg by mouth See admin instructions. Take 5 mg SUN,TUES,WED,FRI,SAT & 7.5 mg MON & THURS   Yes Historical Provider, MD  warfarin (COUMADIN) 3 MG tablet Take 1 tablet (3 mg total) by mouth daily. Patient not taking: Reported on 06/10/2015 05/21/15   Mohan Harwani, MD   Dg Chest 1 View  06/10/2015  CLINICAL DATA:  Fall, diabetes, hypertension, right hip intertrochanteric fracture EXAM: CHEST 1 VIEW COMPARISON:  05/16/2015 FINDINGS: Limited AP supine exam. This accounts for prominence of the mediastinum. Mild cardiomegaly with central vascular congestion. Low lung volumes evident. Trachea is midline. No focal pneumonia or CHF. No effusion or pneumothorax. Bones are osteopenic. Degenerative changes of the spine with a mild scoliosis. IMPRESSION: Stable low volume chest exam. Electronically Signed   By: M.  Shick M.D.   On: 06/10/2015 19:30   Ct Head Wo Contrast  06/10/2015  CLINICAL DATA:  Rolled out of bed Monday morning. Femur fracture. Possible femur. Neck pain. EXAM: CT HEAD WITHOUT CONTRAST CT CERVICAL SPINE WITHOUT CONTRAST TECHNIQUE: Multidetector CT imaging of the head and cervical spine was performed following the standard protocol without intravenous contrast. Multiplanar CT image reconstructions of the cervical spine were also generated. COMPARISON:  Multiple exams, including 05/01/2015 FINDINGS: CT HEAD FINDINGS Left anterior cerebral artery  distribution hypodensity tracking anterior and above the left lateral ventricle similar to distribution on prior MRI, compatible with encephalomalacia. Otherwise the cerebellum, brainstem, cerebral peduncles, thalami, and basal ganglia appear unremarkable. Basilar cisterns unremarkable. No intracranial hemorrhage, mass lesion, or acute CVA. 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 2.5 mm degenerative retrolisthesis at C3-4 with uncinate and facet spurring causing osseous foraminal stenosis at C3-4. The facet arthropathy on the left at C4-5 causes mild to moderate left foraminal stenosis. No cervical spine fracture or acute cervical spine findings. Incidental note is made of failure of fusion of the posterior arch of C1. There is moderate central narrowing of the thecal sac at C3-4 due to a central disc protrusion. Smaller disc protrusion centrally at C4-5 without impingement. IMPRESSION: 1. Evolutionary findings in the prior left anterior cerebral artery distribution infarct. No significant new/acute intracranial findings.   2. No acute cervical spine findings. Cervical spondylosis and degenerative disc disease contribute to impingement at C3-4 and C4-5 as detailed above. 3. 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 Failure of fusion of the posterior arch of C1. Electronically Signed   By: Walter  Liebkemann M.D.   On: 06/10/2015 19:58   Ct Cervical Spine Wo Contrast  06/10/2015  CLINICAL DATA:  Rolled out of bed Monday morning. Femur fracture. Possible femur. Neck pain. EXAM: CT HEAD WITHOUT CONTRAST CT CERVICAL SPINE WITHOUT CONTRAST TECHNIQUE: Multidetector CT imaging of the head and cervical spine was performed following the standard protocol without intravenous contrast. Multiplanar CT image reconstructions of the cervical spine were also generated. COMPARISON:  Multiple exams, including 05/01/2015 FINDINGS: CT HEAD FINDINGS Left anterior cerebral  artery distribution hypodensity tracking anterior and above the left lateral ventricle similar to distribution on prior MRI, compatible with encephalomalacia. Otherwise the cerebellum, brainstem, cerebral peduncles, thalami, and basal ganglia appear unremarkable. Basilar cisterns unremarkable. No intracranial hemorrhage, mass lesion, or acute CVA. 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 2.5 mm degenerative retrolisthesis at C3-4 with uncinate and facet spurring causing osseous foraminal stenosis at C3-4. The facet arthropathy on the left at C4-5 causes mild to moderate left foraminal stenosis. No cervical spine fracture or acute cervical spine findings. Incidental note is made of failure of fusion of the posterior arch of C1. There is moderate central narrowing of the thecal sac at C3-4 due to a central disc protrusion. Smaller disc protrusion centrally at C4-5 without impingement. IMPRESSION: 1. Evolutionary findings in the prior left anterior cerebral artery distribution infarct. No significant new/acute intracranial findings. 2. No acute cervical spine findings. Cervical spondylosis and degenerative disc disease contribute to impingement at C3-4 and C4-5 as detailed above. 3. 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 Failure of fusion of the posterior arch of C1. Electronically Signed   By: Walter  Liebkemann M.D.   On: 06/10/2015 19:58   Dg Femur, Min 2 Views Right  06/10/2015  CLINICAL DATA:  Fall with neck pain.  Initial encounter. EXAM: RIGHT FEMUR 2 VIEWS COMPARISON:  None. FINDINGS: Intertrochanteric right femur fracture with varus angulation and rotation. The right hip is located. Marked osteopenia. There is loosely organized medullary arc and ring calcification at the level of the distal femur compatible with enchondroma. No aggressive features noted. Atherosclerosis. IMPRESSION: Displaced intertrochanteric right femur fracture.  Electronically Signed   By: Jonathon  Watts M.D.   On: 06/10/2015 19:29    Positive ROS: All other systems have been reviewed and were otherwise negative with the exception of those mentioned in the HPI and as above.  Labs cbc  Recent Labs  06/10/15 1846 06/11/15 0138  WBC 10.2 9.7  HGB 10.7* 10.8*  HCT 34.8* 34.6*  PLT 292 262    Labs inflam No results for input(s): CRP in the last 72 hours.  Invalid input(s): ESR  Labs coag  Recent Labs  06/10/15 1846 06/11/15 0138  INR 1.79* 1.88*     Recent Labs  06/10/15 1846 06/11/15 0138  NA 141 140  K 3.2* 3.4*  CL 109 107  CO2 24 24  GLUCOSE 168* 147*  BUN 7 <5*  CREATININE 0.65 0.60  CALCIUM 8.7* 8.8*    Physical Exam: Filed Vitals:   06/11/15 0031 06/11/15 0554  BP: 178/74 155/74  Pulse: 71 66  Temp: 98.6 F (37 C) 98.4 F (36.9 C)  Resp: 16 18     General: Alert, no acute distress, patient has impaired speech due to recent stroke so communication is difficult Cardiovascular: No pedal edema Respiratory: No cyanosis, no use of accessory musculature GI: No organomegaly, abdomen is soft and non-tender Skin: No lesions in the area of chief complaint other than those listed below in MSK exam.  Neurologic: Sensation intact distally Psychiatric: Patient is competent for consent with normal mood and affect Lymphatic: No axillary or cervical lymphadenopathy  MUSCULOSKELETAL:  RUE and RLE are flaccid at baseline due to recent stroke.  R leg is externally rotated and shortened.  Mid swelling of the R hip.  Pain with movement of the leg.  Sensation intact with 2+ distal pulses.   Other extremities are atraumatic with painless ROM and NVI.  Assessment: R intertrochanteric hip fracture with his of recent stroke and GI bleed  Plan: Communication with the patient is difficult due to recent stroke.  Will need to touch base with the family to see if surgical intervention is desired.  Surgical intervention is  recommended to help increase mobilization and improve pain.  Patient is at higher risk of complication due to recent stroke.  Coumadin is on hold at this time.  Patient will remain on bedrest and NPO until a decision is reached.   Michela Herst Marie, PA-C Cell [phone]   06/11/2015 7:32 AM 

## 2015-06-11 NOTE — Progress Notes (Signed)
Subjective:  Awake in no acute distress.  Objective:  Vital Signs in the last 24 hours: Temp:  [98 F (36.7 C)-99.4 F (37.4 C)] 98 F (36.7 C) (12/07 0918) Pulse Rate:  [61-76] 69 (12/07 0918) Resp:  [13-23] 16 (12/07 0918) BP: (142-178)/(70-92) 152/89 mmHg (12/07 0918) SpO2:  [91 %-100 %] 100 % (12/07 0918)  Intake/Output from previous day:   Intake/Output from this shift:    Physical Exam: Neck: no adenopathy, no carotid bruit, no JVD and supple, symmetrical, trachea midline Lungs: clear to auscultation bilaterally Heart: regular rate and rhythm, S1, S2 normal and Soft systolic murmur noted Abdomen: soft, non-tender; bowel sounds normal; no masses,  no organomegaly Extremities: extremities normal, atraumatic, no cyanosis or edema and Shortening and external rotation of right leg noted  Lab Results:  Recent Labs  06/10/15 1846 06/11/15 0138  WBC 10.2 9.7  HGB 10.7* 10.8*  PLT 292 262    Recent Labs  06/10/15 1846 06/11/15 0138  NA 141 140  K 3.2* 3.4*  CL 109 107  CO2 24 24  GLUCOSE 168* 147*  BUN 7 <5*  CREATININE 0.65 0.60   No results for input(s): TROPONINI in the last 72 hours.  Invalid input(s): CK, MB Hepatic Function Panel No results for input(s): PROT, ALBUMIN, AST, ALT, ALKPHOS, BILITOT, BILIDIR, IBILI in the last 72 hours. No results for input(s): CHOL in the last 72 hours. No results for input(s): PROTIME in the last 72 hours.  Imaging: Imaging results have been reviewed and Dg Chest 1 View  06/10/2015  CLINICAL DATA:  Fall, diabetes, hypertension, right hip intertrochanteric fracture EXAM: CHEST 1 VIEW COMPARISON:  05/16/2015 FINDINGS: Limited AP supine exam. This accounts for prominence of the mediastinum. Mild cardiomegaly with central vascular congestion. Low lung volumes evident. Trachea is midline. No focal pneumonia or CHF. No effusion or pneumothorax. Bones are osteopenic. Degenerative changes of the spine with a mild scoliosis.  IMPRESSION: Stable low volume chest exam. Electronically Signed   By: Jerilynn Mages.  Shick M.D.   On: 06/10/2015 19:30   Ct Head Wo Contrast  06/10/2015  CLINICAL DATA:  Rolled out of bed Monday morning. Femur fracture. Possible femur. Neck pain. EXAM: CT HEAD WITHOUT CONTRAST CT CERVICAL SPINE WITHOUT CONTRAST TECHNIQUE: Multidetector CT imaging of the head and cervical spine was performed following the standard protocol without intravenous contrast. Multiplanar CT image reconstructions of the cervical spine were also generated. COMPARISON:  Multiple exams, including 05/01/2015 FINDINGS: CT HEAD FINDINGS Left anterior cerebral artery distribution hypodensity tracking anterior and above the left lateral ventricle similar to distribution on prior MRI, compatible with encephalomalacia. Otherwise the cerebellum, brainstem, cerebral peduncles, thalami, and basal ganglia appear unremarkable. Basilar cisterns unremarkable. No intracranial hemorrhage, mass lesion, or acute CVA. 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 2.5 mm degenerative retrolisthesis at C3-4 with uncinate and facet spurring causing osseous foraminal stenosis at C3-4. The facet arthropathy on the left at C4-5 causes mild to moderate left foraminal stenosis. No cervical spine fracture or acute cervical spine findings. Incidental note is made of failure of fusion of the posterior arch of C1. There is moderate central narrowing of the thecal sac at C3-4 due to a central disc protrusion. Smaller disc protrusion centrally at C4-5 without impingement. IMPRESSION: 1. Evolutionary findings in the prior left anterior cerebral artery distribution infarct. No significant new/acute intracranial findings. 2. No acute cervical spine findings. Cervical spondylosis and degenerative disc disease contribute to impingement at C3-4 and  C4-5 as detailed above. 3. 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 Failure of fusion  of the posterior arch of C1. Electronically Signed   By: Van Clines M.D.   On: 06/10/2015 19:58   Ct Cervical Spine Wo Contrast  06/10/2015  CLINICAL DATA:  Rolled out of bed Monday morning. Femur fracture. Possible femur. Neck pain. EXAM: CT HEAD WITHOUT CONTRAST CT CERVICAL SPINE WITHOUT CONTRAST TECHNIQUE: Multidetector CT imaging of the head and cervical spine was performed following the standard protocol without intravenous contrast. Multiplanar CT image reconstructions of the cervical spine were also generated. COMPARISON:  Multiple exams, including 05/01/2015 FINDINGS: CT HEAD FINDINGS Left anterior cerebral artery distribution hypodensity tracking anterior and above the left lateral ventricle similar to distribution on prior MRI, compatible with encephalomalacia. Otherwise the cerebellum, brainstem, cerebral peduncles, thalami, and basal ganglia appear unremarkable. Basilar cisterns unremarkable. No intracranial hemorrhage, mass lesion, or acute CVA. 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 2.5 mm degenerative retrolisthesis at C3-4 with uncinate and facet spurring causing osseous foraminal stenosis at C3-4. The facet arthropathy on the left at C4-5 causes mild to moderate left foraminal stenosis. No cervical spine fracture or acute cervical spine findings. Incidental note is made of failure of fusion of the posterior arch of C1. There is moderate central narrowing of the thecal sac at C3-4 due to a central disc protrusion. Smaller disc protrusion centrally at C4-5 without impingement. IMPRESSION: 1. Evolutionary findings in the prior left anterior cerebral artery distribution infarct. No significant new/acute intracranial findings. 2. No acute cervical spine findings. Cervical spondylosis and degenerative disc disease contribute to impingement at C3-4 and C4-5 as detailed above. 3. 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 Failure of  fusion of the posterior arch of C1. Electronically Signed   By: Van Clines M.D.   On: 06/10/2015 19:58   Dg Femur, Min 2 Views Right  06/10/2015  CLINICAL DATA:  Fall with neck pain.  Initial encounter. EXAM: RIGHT FEMUR 2 VIEWS COMPARISON:  None. FINDINGS: Intertrochanteric right femur fracture with varus angulation and rotation. The right hip is located. Marked osteopenia. There is loosely organized medullary arc and ring calcification at the level of the distal femur compatible with enchondroma. No aggressive features noted. Atherosclerosis. IMPRESSION: Displaced intertrochanteric right femur fracture. Electronically Signed   By: Monte Fantasia M.D.   On: 06/10/2015 19:29    Cardiac Studies:  Assessment/Plan:  Right intertrochanteric displaced femur Fracture status post fall History of recent left CVA with right paresis  Mild coronary artery disease Hypertension Diabetes mellitus Hyperlipidemia Compensated congestive heart failure secondary to preserved LV systolic function Patent for him and oval History of tobacco abuse in the past Plan Scheduled for right hip surgery tomorrow Check PT/INR this p.m. if I no not trending down and we will give a small dose of vitamin K tonight Check PT/INR in a.m.  LOS: 1 day    Charolette Forward 06/11/2015, 11:14 AM

## 2015-06-11 NOTE — Progress Notes (Signed)
I have spoken with her family and they wish to go forward with Surgery.   Plan for OR on Thursday. NPO after midnight.    Andrue Dini D

## 2015-06-11 NOTE — Consult Note (Signed)
ORTHOPAEDIC CONSULTATION  REQUESTING PHYSICIAN: Charolette Forward, MD  Chief Complaint: R hip fracture  HPI: [name] is a 69 y.o. female who complains of R hip pain after a mechanical fall from her bed to the floor.  The staff at her facility found her on the floor. She complained of increasing R hip pain, so they transferred her via EMS to the ED for evaluation.  In the ED she was found to have an intertrochanteric fracture of the R hip.  She was then transferred to [place] for further evaluation.    The patient has a hx of a recent stroke affecting her R side.  At baseline she is very flaccid on the R side with impair speech. She was started on Coumadin.  On  November 10, she was found to have a GI bleed.  She also has a hx of CAD, DM, HTN.   Past Medical History  Diagnosis Date  . CHF (congestive heart failure) (HCC)     EF 10-93%, grade 1 diastolic dysfunction per echo 04/2015  . Diabetes ([place]) 2016    Type II. On insulin  . Hypertension   . CVA (cerebral infarction) 04/2015    Started Plavix 04/2015  . Patent foramen ovale 04/2015    Started on warfarin 04/2015  . Enchondroma of bone 2011    left femur.   . Stercoral ulcer of rectum 05/16/2015   Past Surgical History  Procedure Laterality Date  . Cardiac surgery      Cath without stent  . Tee without cardioversion N/A 05/05/2015    Procedure: TRANSESOPHAGEAL ECHOCARDIOGRAM (TEE);  Surgeon: Dixie Dials, MD;  Location: [facility] ENDOSCOPY;  Service: Cardiovascular;  Laterality: N/A;  . Colonoscopy N/A 05/15/2015    Procedure: COLONOSCOPY;  Surgeon: Mauri Pole, MD;  Location: [facility] ENDOSCOPY;  Service: Endoscopy;  Laterality: N/A;  . Flexible sigmoidoscopy N/A 05/15/2015    Procedure: FLEXIBLE SIGMOIDOSCOPY;  Surgeon: Mauri Pole, MD;  Location: [facility] ENDOSCOPY;  Service: Endoscopy;  Laterality: N/A;  at bedside   Social History   Social History  . Marital Status: Married    Spouse Name: N/A  . Number of  Children: N/A  . Years of Education: N/A   Social History Main Topics  . Smoking status: Current Every Day Smoker    Types: Cigarettes  . Smokeless tobacco: None  . Alcohol Use: No  . Drug Use: No  . Sexual Activity: Not Asked   Other Topics Concern  . None   Social History Narrative   Family History  Problem Relation Age of Onset  . Hypertension Mother   . Heart failure Mother   . Hyperlipidemia Mother    No Known Allergies Prior to Admission medications   Medication Sig Start Date End Date Taking? Authorizing Provider  acetaminophen (TYLENOL) 325 MG tablet Take 325 mg by mouth every 6 (six) hours as needed for mild pain.    Yes Historical Provider, MD  atorvastatin (LIPITOR) 40 MG tablet Take 1 tablet (40 mg total) by mouth daily. 05/06/15  Yes Charolette Forward, MD  ferrous sulfate 325 (65 FE) MG tablet Take 1 tablet (325 mg total) by mouth 3 (three) times daily with meals. 05/21/15  Yes Charolette Forward, MD  HYDROcodone-acetaminophen (NORCO/VICODIN) 5-325 MG tablet Take 1 tablet by mouth every 6 (six) hours as needed for moderate pain or severe pain.   Yes Historical Provider, MD  insulin glargine (LANTUS) 100 UNIT/ML injection Inject 0.05 mLs (5 Units total) into the  skin at bedtime. Patient taking differently: Inject 10 Units into the skin daily.  05/21/15  Yes Charolette Forward, MD  insulin lispro (HUMALOG) 100 UNIT/ML injection Inject 0-10 Units into the skin 3 (three) times daily before meals. 0-150=0 151-200=2 201-250=3 251-300=4 301-350=6 351-400=8 401-402=10 & notify MD   Yes Historical Provider, MD  metoprolol succinate (TOPROL-XL) 25 MG 24 hr tablet Take 1 tablet (25 mg total) by mouth daily. 05/21/15  Yes Charolette Forward, MD  miconazole (MICOTIN) 2 % cream Apply 1 application topically daily as needed (rash).    Yes Historical Provider, MD  nitroGLYCERIN (NITROSTAT) 0.4 MG SL tablet Place 0.4 mg under the tongue every 5 (five) minutes as needed for chest pain.   Yes  Historical Provider, MD  pantoprazole (PROTONIX) 40 MG tablet Take 1 tablet (40 mg total) by mouth daily at 6 (six) AM. 11/14/12  Yes Charolette Forward, MD  polyethylene glycol (MIRALAX / GLYCOLAX) packet Take 17 g by mouth daily. Patient taking differently: Take 17 g by mouth daily as needed for mild constipation or moderate constipation.  05/21/15  Yes Charolette Forward, MD  traMADol (ULTRAM) 50 MG tablet Take 50 mg by mouth every 8 (eight) hours as needed for moderate pain or severe pain.   Yes Historical Provider, MD  warfarin (COUMADIN) 5 MG tablet Take 5-7.5 mg by mouth See admin instructions. Take 5 mg SUN,TUES,WED,FRI,SAT & 7.5 mg MON & THURS   Yes Historical Provider, MD  warfarin (COUMADIN) 3 MG tablet Take 1 tablet (3 mg total) by mouth daily. Patient not taking: Reported on 06/10/2015 05/21/15   Charolette Forward, MD   Dg Chest 1 View  06/10/2015  CLINICAL DATA:  Fall, diabetes, hypertension, right hip intertrochanteric fracture EXAM: CHEST 1 VIEW COMPARISON:  05/16/2015 FINDINGS: Limited AP supine exam. This accounts for prominence of the mediastinum. Mild cardiomegaly with central vascular congestion. Low lung volumes evident. Trachea is midline. No focal pneumonia or CHF. No effusion or pneumothorax. Bones are osteopenic. Degenerative changes of the spine with a mild scoliosis. IMPRESSION: Stable low volume chest exam. Electronically Signed   By: Jerilynn Mages.  Shick M.D.   On: 06/10/2015 19:30   Ct Head Wo Contrast  06/10/2015  CLINICAL DATA:  Rolled out of bed Monday morning. Femur fracture. Possible femur. Neck pain. EXAM: CT HEAD WITHOUT CONTRAST CT CERVICAL SPINE WITHOUT CONTRAST TECHNIQUE: Multidetector CT imaging of the head and cervical spine was performed following the standard protocol without intravenous contrast. Multiplanar CT image reconstructions of the cervical spine were also generated. COMPARISON:  Multiple exams, including 05/01/2015 FINDINGS: CT HEAD FINDINGS Left anterior cerebral artery  distribution hypodensity tracking anterior and above the left lateral ventricle similar to distribution on prior MRI, compatible with encephalomalacia. Otherwise the cerebellum, brainstem, cerebral peduncles, thalami, and basal ganglia appear unremarkable. Basilar cisterns unremarkable. No intracranial hemorrhage, mass lesion, or acute CVA. 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 2.5 mm degenerative retrolisthesis at C3-4 with uncinate and facet spurring causing osseous foraminal stenosis at C3-4. The facet arthropathy on the left at C4-5 causes mild to moderate left foraminal stenosis. No cervical spine fracture or acute cervical spine findings. Incidental note is made of failure of fusion of the posterior arch of C1. There is moderate central narrowing of the thecal sac at C3-4 due to a central disc protrusion. Smaller disc protrusion centrally at C4-5 without impingement. IMPRESSION: 1. Evolutionary findings in the prior left anterior cerebral artery distribution infarct. No significant new/acute intracranial findings.  2. No acute cervical spine findings. Cervical spondylosis and degenerative disc disease contribute to impingement at C3-4 and C4-5 as detailed above. 3. 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 Failure of fusion of the posterior arch of C1. Electronically Signed   By: Van Clines M.D.   On: 06/10/2015 19:58   Ct Cervical Spine Wo Contrast  06/10/2015  CLINICAL DATA:  Rolled out of bed Monday morning. Femur fracture. Possible femur. Neck pain. EXAM: CT HEAD WITHOUT CONTRAST CT CERVICAL SPINE WITHOUT CONTRAST TECHNIQUE: Multidetector CT imaging of the head and cervical spine was performed following the standard protocol without intravenous contrast. Multiplanar CT image reconstructions of the cervical spine were also generated. COMPARISON:  Multiple exams, including 05/01/2015 FINDINGS: CT HEAD FINDINGS Left anterior cerebral  artery distribution hypodensity tracking anterior and above the left lateral ventricle similar to distribution on prior MRI, compatible with encephalomalacia. Otherwise the cerebellum, brainstem, cerebral peduncles, thalami, and basal ganglia appear unremarkable. Basilar cisterns unremarkable. No intracranial hemorrhage, mass lesion, or acute CVA. 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 2.5 mm degenerative retrolisthesis at C3-4 with uncinate and facet spurring causing osseous foraminal stenosis at C3-4. The facet arthropathy on the left at C4-5 causes mild to moderate left foraminal stenosis. No cervical spine fracture or acute cervical spine findings. Incidental note is made of failure of fusion of the posterior arch of C1. There is moderate central narrowing of the thecal sac at C3-4 due to a central disc protrusion. Smaller disc protrusion centrally at C4-5 without impingement. IMPRESSION: 1. Evolutionary findings in the prior left anterior cerebral artery distribution infarct. No significant new/acute intracranial findings. 2. No acute cervical spine findings. Cervical spondylosis and degenerative disc disease contribute to impingement at C3-4 and C4-5 as detailed above. 3. 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 Failure of fusion of the posterior arch of C1. Electronically Signed   By: Van Clines M.D.   On: 06/10/2015 19:58   Dg Femur, Min 2 Views Right  06/10/2015  CLINICAL DATA:  Fall with neck pain.  Initial encounter. EXAM: RIGHT FEMUR 2 VIEWS COMPARISON:  None. FINDINGS: Intertrochanteric right femur fracture with varus angulation and rotation. The right hip is located. Marked osteopenia. There is loosely organized medullary arc and ring calcification at the level of the distal femur compatible with enchondroma. No aggressive features noted. Atherosclerosis. IMPRESSION: Displaced intertrochanteric right femur fracture.  Electronically Signed   By: Monte Fantasia M.D.   On: 06/10/2015 19:29    Positive ROS: All other systems have been reviewed and were otherwise negative with the exception of those mentioned in the HPI and as above.  Labs cbc  Recent Labs  06/10/15 1846 06/11/15 0138  WBC 10.2 9.7  HGB 10.7* 10.8*  HCT 34.8* 34.6*  PLT 292 262    Labs inflam No results for input(s): CRP in the last 72 hours.  Invalid input(s): ESR  Labs coag  Recent Labs  06/10/15 1846 06/11/15 0138  INR 1.79* 1.88*     Recent Labs  06/10/15 1846 06/11/15 0138  NA 141 140  K 3.2* 3.4*  CL 109 107  CO2 24 24  GLUCOSE 168* 147*  BUN 7 <5*  CREATININE 0.65 0.60  CALCIUM 8.7* 8.8*    Physical Exam: Filed Vitals:   06/11/15 0031 06/11/15 0554  BP: 178/74 155/74  Pulse: 71 66  Temp: 98.6 F (37 C) 98.4 F (36.9 C)  Resp: 16 18  General: Alert, no acute distress, patient has impaired speech due to recent stroke so communication is difficult Cardiovascular: No pedal edema Respiratory: No cyanosis, no use of accessory musculature GI: No organomegaly, abdomen is soft and non-tender Skin: No lesions in the area of chief complaint other than those listed below in MSK exam.  Neurologic: Sensation intact distally Psychiatric: Patient is competent for consent with normal mood and affect Lymphatic: No axillary or cervical lymphadenopathy  MUSCULOSKELETAL:  RUE and RLE are flaccid at baseline due to recent stroke.  R leg is externally rotated and shortened.  Mid swelling of the R hip.  Pain with movement of the leg.  Sensation intact with 2+ distal pulses.   Other extremities are atraumatic with painless ROM and NVI.  Assessment: R intertrochanteric hip fracture with his of recent stroke and GI bleed  Plan: Communication with the patient is difficult due to recent stroke.  Will need to touch base with the family to see if surgical intervention is desired.  Surgical intervention is  recommended to help increase mobilization and improve pain.  Patient is at higher risk of complication due to recent stroke.  Coumadin is on hold at this time.  Patient will remain on bedrest and NPO until a decision is reached.   Gae Dry, PA-C Cell [phone]   06/11/2015 7:32 AM

## 2015-06-12 ENCOUNTER — Inpatient Hospital Stay (HOSPITAL_COMMUNITY): Payer: Medicare Other | Admitting: Certified Registered Nurse Anesthetist

## 2015-06-12 ENCOUNTER — Encounter (HOSPITAL_COMMUNITY)
Admission: EM | Disposition: A | Discharge status: Skilled Nursing Facility | Payer: Self-pay | Source: Home / Self Care | Attending: Cardiology

## 2015-06-12 ENCOUNTER — Inpatient Hospital Stay (HOSPITAL_COMMUNITY): Payer: Medicare Other

## 2015-06-12 ENCOUNTER — Encounter (HOSPITAL_COMMUNITY): Payer: Self-pay | Admitting: Certified Registered Nurse Anesthetist

## 2015-06-12 HISTORY — PX: INTRAMEDULLARY (IM) NAIL INTERTROCHANTERIC: SHX5875

## 2015-06-12 LAB — GLUCOSE, CAPILLARY
GLUCOSE-CAPILLARY: 141 mg/dL — AB (ref 65–99)
GLUCOSE-CAPILLARY: 145 mg/dL — AB (ref 65–99)
GLUCOSE-CAPILLARY: 169 mg/dL — AB (ref 65–99)
Glucose-Capillary: 168 mg/dL — ABNORMAL HIGH (ref 65–99)
Glucose-Capillary: 156 mg/dL — ABNORMAL HIGH (ref 65–99)

## 2015-06-12 LAB — CREATININE, SERUM
Creatinine, Ser: 0.7 mg/dL (ref 0.44–1.00)
GFR calc non Af Amer: >60 mL/min (ref >60)

## 2015-06-12 LAB — PROTIME-INR
INR: 1.71 — ABNORMAL HIGH (ref 0.00–1.49)
INR: 1.22 (ref 0.00–1.49)
PROTHROMBIN TIME: 20.1 s — AB (ref 11.6–15.2)
Prothrombin Time: 15.6 seconds — ABNORMAL HIGH (ref 11.6–15.2)

## 2015-06-12 LAB — CBC
HEMATOCRIT: 32.7 % — AB (ref 36.0–46.0)
Hemoglobin: 10.2 g/dL — ABNORMAL LOW (ref 12.0–15.0)
MCH: 28.2 pg (ref 26.0–34.0)
MCHC: 31.2 g/dL (ref 30.0–36.0)
MCV: 90.3 fL (ref 78.0–100.0)
Platelets: 232 10*3/uL (ref 150–400)
RBC: 3.62 MIL/uL — ABNORMAL LOW (ref 3.87–5.11)
RDW: 14.1 % (ref 11.5–15.5)
WBC: 11.3 10*3/uL — AB (ref 4.0–10.5)

## 2015-06-12 LAB — HEMOGLOBIN A1C
Hgb A1c MFr Bld: 7.3 % — ABNORMAL HIGH (ref 4.8–5.6)
Mean Plasma Glucose: 163 mg/dL

## 2015-06-12 SURGERY — FIXATION, FRACTURE, INTERTROCHANTERIC, WITH INTRAMEDULLARY ROD
Anesthesia: General | Laterality: Right

## 2015-06-12 MED ORDER — GLYCOPYRROLATE 0.2 MG/ML IJ SOLN
INTRAMUSCULAR | Status: DC | PRN
  Administered 2015-06-12: 0.4 mg via INTRAVENOUS

## 2015-06-12 MED ORDER — HYDROMORPHONE HCL 1 MG/ML IJ SOLN
INTRAMUSCULAR | Status: AC
  Filled 2015-06-12: qty 1

## 2015-06-12 MED ORDER — METOCLOPRAMIDE HCL 5 MG PO TABS: 5.0000 mg | ORAL_TABLET | Freq: Three times a day (TID) | ORAL | Status: DC | PRN

## 2015-06-12 MED ORDER — HYDROMORPHONE HCL 1 MG/ML IJ SOLN
1.0000 mg | INTRAMUSCULAR | Status: DC | PRN
  Administered 2015-06-13 – 2015-06-15 (×5): 1 mg via INTRAVENOUS
  Filled 2015-06-12 (×5): qty 1

## 2015-06-12 MED ORDER — MEPERIDINE HCL 25 MG/ML IJ SOLN: 6.2500 mg | INTRAMUSCULAR | Status: DC | PRN

## 2015-06-12 MED ORDER — ONDANSETRON HCL 4 MG/2ML IJ SOLN: 4.0000 mg | Freq: Once | INTRAMUSCULAR | Status: DC | PRN

## 2015-06-12 MED ORDER — PROPOFOL 10 MG/ML IV BOLUS
INTRAVENOUS | Status: AC
  Filled 2015-06-12: qty 20

## 2015-06-12 MED ORDER — LIDOCAINE HCL (CARDIAC) 20 MG/ML IV SOLN
INTRAVENOUS | Status: DC | PRN
  Administered 2015-06-12: 50 mg via INTRATRACHEAL
  Administered 2015-06-12: 100 mg via INTRAVENOUS

## 2015-06-12 MED ORDER — HYDROCODONE-ACETAMINOPHEN 5-325 MG PO TABS: 1.0000 | ORAL_TABLET | Freq: Four times a day (QID) | ORAL | Status: DC | PRN

## 2015-06-12 MED ORDER — FENTANYL CITRATE (PF) 100 MCG/2ML IJ SOLN
INTRAMUSCULAR | Status: DC | PRN
  Administered 2015-06-12: 100 ug via INTRAVENOUS
  Administered 2015-06-12 (×2): 50 ug via INTRAVENOUS

## 2015-06-12 MED ORDER — 0.9 % SODIUM CHLORIDE (POUR BTL) OPTIME
TOPICAL | Status: DC | PRN
  Administered 2015-06-12: 1000 mL

## 2015-06-12 MED ORDER — ONDANSETRON HCL 4 MG/2ML IJ SOLN
INTRAMUSCULAR | Status: DC | PRN
  Administered 2015-06-12: 4 mg via INTRAVENOUS

## 2015-06-12 MED ORDER — ONDANSETRON HCL 4 MG/2ML IJ SOLN: 4.0000 mg | Freq: Four times a day (QID) | INTRAMUSCULAR | Status: DC | PRN

## 2015-06-12 MED ORDER — PROPOFOL 10 MG/ML IV BOLUS
INTRAVENOUS | Status: DC | PRN
  Administered 2015-06-12: 100 mg via INTRAVENOUS

## 2015-06-12 MED ORDER — PHENYLEPHRINE HCL 10 MG/ML IJ SOLN
10.0000 mg | INTRAMUSCULAR | Status: DC | PRN
  Administered 2015-06-12: 25 ug/min via INTRAVENOUS

## 2015-06-12 MED ORDER — CEFAZOLIN SODIUM-DEXTROSE 2-3 GM-% IV SOLR
2.0000 g | Freq: Four times a day (QID) | INTRAVENOUS | Status: AC
  Administered 2015-06-12 (×2): 2 g via INTRAVENOUS
  Filled 2015-06-12 (×2): qty 50

## 2015-06-12 MED ORDER — MENTHOL 3 MG MT LOZG: 1.0000 | LOZENGE | OROMUCOSAL | Status: DC | PRN

## 2015-06-12 MED ORDER — NEOSTIGMINE METHYLSULFATE 10 MG/10ML IV SOLN
INTRAVENOUS | Status: DC | PRN
  Administered 2015-06-12: 3 mg via INTRAVENOUS

## 2015-06-12 MED ORDER — FENTANYL CITRATE (PF) 250 MCG/5ML IJ SOLN
INTRAMUSCULAR | Status: AC
  Filled 2015-06-12: qty 5

## 2015-06-12 MED ORDER — PHENOL 1.4 % MT LIQD: 1.0000 | OROMUCOSAL | Status: DC | PRN

## 2015-06-12 MED ORDER — LACTATED RINGERS IV SOLN
INTRAVENOUS | Status: DC
  Administered 2015-06-12: 10:00:00 via INTRAVENOUS

## 2015-06-12 MED ORDER — VITAMIN K1 10 MG/ML IJ SOLN
1.0000 mg | Freq: Once | INTRAVENOUS | Status: AC
  Administered 2015-06-12: 1 mg via INTRAVENOUS
  Filled 2015-06-12: qty 0.1

## 2015-06-12 MED ORDER — HEPARIN SODIUM (PORCINE) 5000 UNIT/ML IJ SOLN
5000.0000 [IU] | Freq: Three times a day (TID) | INTRAMUSCULAR | Status: DC
  Administered 2015-06-13 – 2015-06-16 (×11): 5000 [IU] via SUBCUTANEOUS
  Filled 2015-06-12 (×11): qty 1

## 2015-06-12 MED ORDER — HYDROMORPHONE HCL 1 MG/ML IJ SOLN
0.2500 mg | INTRAMUSCULAR | Status: DC | PRN
  Administered 2015-06-12 (×4): 0.25 mg via INTRAVENOUS

## 2015-06-12 MED ORDER — ROCURONIUM BROMIDE 100 MG/10ML IV SOLN
INTRAVENOUS | Status: DC | PRN
Start: 2015-06-12 — End: 2015-06-12
  Administered 2015-06-12: 40 mg via INTRAVENOUS

## 2015-06-12 MED ORDER — METOCLOPRAMIDE HCL 5 MG/ML IJ SOLN: 5.0000 mg | Freq: Three times a day (TID) | INTRAMUSCULAR | Status: DC | PRN

## 2015-06-12 MED ORDER — ONDANSETRON HCL 4 MG PO TABS
4.0000 mg | ORAL_TABLET | Freq: Four times a day (QID) | ORAL | Status: DC | PRN
Start: 2015-06-12 — End: 2015-06-16

## 2015-06-12 SURGICAL SUPPLY — 36 items
CLOSURE STERI-STRIP 1/2X4 (GAUZE/BANDAGES/DRESSINGS) ×1
CLSR STERI-STRIP ANTIMIC 1/2X4 (GAUZE/BANDAGES/DRESSINGS) ×1 IMPLANT
COVER PERINEAL POST (MISCELLANEOUS) ×3 IMPLANT
COVER SURGICAL LIGHT HANDLE (MISCELLANEOUS) ×3 IMPLANT
DRAPE STERI IOBAN 125X83 (DRAPES) ×3 IMPLANT
DRSG MEPILEX BORDER 4X4 (GAUZE/BANDAGES/DRESSINGS) ×6 IMPLANT
DURAPREP 26ML APPLICATOR (WOUND CARE) ×3 IMPLANT
ELECT REM PT RETURN 9FT ADLT (ELECTROSURGICAL) ×3
ELECTRODE REM PT RTRN 9FT ADLT (ELECTROSURGICAL) ×1 IMPLANT
GLOVE BIO SURGEON STRL SZ7 (GLOVE) ×6 IMPLANT
GLOVE BIO SURGEON STRL SZ7.5 (GLOVE) ×3 IMPLANT
GLOVE BIOGEL PI IND STRL 7.0 (GLOVE) ×2 IMPLANT
GLOVE BIOGEL PI IND STRL 8 (GLOVE) ×1 IMPLANT
GLOVE BIOGEL PI INDICATOR 7.0 (GLOVE) ×4
GLOVE BIOGEL PI INDICATOR 8 (GLOVE) ×2
GOWN STRL REUS W/ TWL LRG LVL3 (GOWN DISPOSABLE) ×2 IMPLANT
GOWN STRL REUS W/TWL LRG LVL3 (GOWN DISPOSABLE) ×6
GUIDEROD T2 3X1000 (ROD) ×2 IMPLANT
K-WIRE  3.2X450M STR (WIRE) ×2
K-WIRE 3.2X450M STR (WIRE) ×1
KIT ROOM TURNOVER OR (KITS) ×3 IMPLANT
KWIRE 3.2X450M STR (WIRE) IMPLANT
MANIFOLD NEPTUNE II (INSTRUMENTS) ×3 IMPLANT
NAIL GAMMA LG R 5TI 10X400X125 (Nail) ×2 IMPLANT
NS IRRIG 1000ML POUR BTL (IV SOLUTION) ×3 IMPLANT
PACK GENERAL/GYN (CUSTOM PROCEDURE TRAY) ×3 IMPLANT
PAD ARMBOARD 7.5X6 YLW CONV (MISCELLANEOUS) ×3 IMPLANT
PAD CAST 4YDX4 CTTN HI CHSV (CAST SUPPLIES) ×1 IMPLANT
PADDING CAST COTTON 4X4 STRL (CAST SUPPLIES) ×3
SCREW LAG GAMMA 3 95MM (Screw) ×2 IMPLANT
SUT MNCRL AB 4-0 PS2 18 (SUTURE) IMPLANT
SUT MON AB 2-0 CT1 27 (SUTURE) ×3 IMPLANT
SUT VIC AB 0 CT1 27 (SUTURE) ×3
SUT VIC AB 0 CT1 27XBRD ANBCTR (SUTURE) ×1 IMPLANT
TOWEL OR 17X24 6PK STRL BLUE (TOWEL DISPOSABLE) ×3 IMPLANT
TOWEL OR 17X26 10 PK STRL BLUE (TOWEL DISPOSABLE) ×3 IMPLANT

## 2015-06-12 NOTE — Anesthesia Preprocedure Evaluation (Signed)
Anesthesia Evaluation  Patient identified by MRN, date of birth, ID band Patient awake    Reviewed: Allergy & Precautions, NPO status , Patient's Chart, lab work & pertinent test results  Airway Mallampati: II  TM Distance: >3 FB Neck ROM: Full    Dental   Pulmonary Current Smoker,    Pulmonary exam normal        Cardiovascular hypertension, Pt. on medications Normal cardiovascular exam     Neuro/Psych CVA, Residual Symptoms    GI/Hepatic   Endo/Other  diabetes, Type 2  Renal/GU      Musculoskeletal   Abdominal   Peds  Hematology   Anesthesia Other Findings   Reproductive/Obstetrics                             Anesthesia Physical Anesthesia Plan  ASA: III  Anesthesia Plan: General   Post-op Pain Management:    Induction: Intravenous  Airway Management Planned: Oral ETT  Additional Equipment:   Intra-op Plan:   Post-operative Plan: Extubation in OR  Informed Consent: I have reviewed the patients History and Physical, chart, labs and discussed the procedure including the risks, benefits and alternatives for the proposed anesthesia with the patient or authorized representative who has indicated his/her understanding and acceptance.     Plan Discussed with: CRNA and Surgeon  Anesthesia Plan Comments:         Anesthesia Quick Evaluation

## 2015-06-12 NOTE — Progress Notes (Signed)
Consents signed by son

## 2015-06-12 NOTE — H&P (View-Only) (Signed)
ORTHOPAEDIC CONSULTATION  REQUESTING PHYSICIAN: Mohan Harwani, MD  Chief Complaint: R hip fracture  HPI: [name] is a 69 y.o. female who complains of R hip pain after a mechanical fall from her bed to the floor.  The staff at her facility found her on the floor. She complained of increasing R hip pain, so they transferred her via EMS to the ED for evaluation.  In the ED she was found to have an intertrochanteric fracture of the R hip.  She was then transferred to [facility] for further evaluation.    The patient has a hx of a recent stroke affecting her R side.  At baseline she is very flaccid on the R side with impair speech. She was started on Coumadin.  On  November 10, she was found to have a GI bleed.  She also has a hx of CAD, DM, HTN.   Past Medical History  Diagnosis Date  . CHF (congestive heart failure) (HCC)     EF 50-55%, grade 1 diastolic dysfunction per echo 04/2015  . Diabetes (HCC) 2016    Type II. On insulin  . Hypertension   . CVA (cerebral infarction) 04/2015    Started Plavix 04/2015  . Patent foramen ovale 04/2015    Started on warfarin 04/2015  . Enchondroma of bone 2011    left femur.   . Stercoral ulcer of rectum 05/16/2015   Past Surgical History  Procedure Laterality Date  . Cardiac surgery      Cath without stent  . Tee without cardioversion N/A 05/05/2015    Procedure: TRANSESOPHAGEAL ECHOCARDIOGRAM (TEE);  Surgeon: Ajay Kadakia, MD;  Location: [facility];  Service: Cardiovascular;  Laterality: N/A;  . Colonoscopy N/A 05/15/2015    Procedure: COLONOSCOPY;  Surgeon: Kavitha Nandigam V, MD;  Location: [facility];  Service: Endoscopy;  Laterality: N/A;  . Flexible sigmoidoscopy N/A 05/15/2015    Procedure: FLEXIBLE SIGMOIDOSCOPY;  Surgeon: Kavitha Nandigam V, MD;  Location: [facility];  Service: Endoscopy;  Laterality: N/A;  at bedside   Social History   Social History  . Marital Status: Married    Spouse Name: N/A  . Number of  Children: N/A  . Years of Education: N/A   Social History Main Topics  . Smoking status: Current Every Day Smoker    Types: Cigarettes  . Smokeless tobacco: None  . Alcohol Use: No  . Drug Use: No  . Sexual Activity: Not Asked   Other Topics Concern  . None   Social History Narrative   Family History  Problem Relation Age of Onset  . Hypertension Mother   . Heart failure Mother   . Hyperlipidemia Mother    No Known Allergies Prior to Admission medications   Medication Sig Start Date End Date Taking? Authorizing Provider  acetaminophen (TYLENOL) 325 MG tablet Take 325 mg by mouth every 6 (six) hours as needed for mild pain.    Yes Historical Provider, MD  atorvastatin (LIPITOR) 40 MG tablet Take 1 tablet (40 mg total) by mouth daily. 05/06/15  Yes Mohan Harwani, MD  ferrous sulfate 325 (65 FE) MG tablet Take 1 tablet (325 mg total) by mouth 3 (three) times daily with meals. 05/21/15  Yes Mohan Harwani, MD  HYDROcodone-acetaminophen (NORCO/VICODIN) 5-325 MG tablet Take 1 tablet by mouth every 6 (six) hours as needed for moderate pain or severe pain.   Yes Historical Provider, MD  insulin glargine (LANTUS) 100 UNIT/ML injection Inject 0.05 mLs (5 Units total) into the   skin at bedtime. Patient taking differently: Inject 10 Units into the skin daily.  05/21/15  Yes Mohan Harwani, MD  insulin lispro (HUMALOG) 100 UNIT/ML injection Inject 0-10 Units into the skin 3 (three) times daily before meals. 0-150=0 151-200=2 201-250=3 251-300=4 301-350=6 351-400=8 401-402=10 & notify MD   Yes Historical Provider, MD  metoprolol succinate (TOPROL-XL) 25 MG 24 hr tablet Take 1 tablet (25 mg total) by mouth daily. 05/21/15  Yes Mohan Harwani, MD  miconazole (MICOTIN) 2 % cream Apply 1 application topically daily as needed (rash).    Yes Historical Provider, MD  nitroGLYCERIN (NITROSTAT) 0.4 MG SL tablet Place 0.4 mg under the tongue every 5 (five) minutes as needed for chest pain.   Yes  Historical Provider, MD  pantoprazole (PROTONIX) 40 MG tablet Take 1 tablet (40 mg total) by mouth daily at 6 (six) AM. 11/14/12  Yes Mohan Harwani, MD  polyethylene glycol (MIRALAX / GLYCOLAX) packet Take 17 g by mouth daily. Patient taking differently: Take 17 g by mouth daily as needed for mild constipation or moderate constipation.  05/21/15  Yes Mohan Harwani, MD  traMADol (ULTRAM) 50 MG tablet Take 50 mg by mouth every 8 (eight) hours as needed for moderate pain or severe pain.   Yes Historical Provider, MD  warfarin (COUMADIN) 5 MG tablet Take 5-7.5 mg by mouth See admin instructions. Take 5 mg SUN,TUES,WED,FRI,SAT & 7.5 mg MON & THURS   Yes Historical Provider, MD  warfarin (COUMADIN) 3 MG tablet Take 1 tablet (3 mg total) by mouth daily. Patient not taking: Reported on 06/10/2015 05/21/15   Mohan Harwani, MD   Dg Chest 1 View  06/10/2015  CLINICAL DATA:  Fall, diabetes, hypertension, right hip intertrochanteric fracture EXAM: CHEST 1 VIEW COMPARISON:  05/16/2015 FINDINGS: Limited AP supine exam. This accounts for prominence of the mediastinum. Mild cardiomegaly with central vascular congestion. Low lung volumes evident. Trachea is midline. No focal pneumonia or CHF. No effusion or pneumothorax. Bones are osteopenic. Degenerative changes of the spine with a mild scoliosis. IMPRESSION: Stable low volume chest exam. Electronically Signed   By: M.  Shick M.D.   On: 06/10/2015 19:30   Ct Head Wo Contrast  06/10/2015  CLINICAL DATA:  Rolled out of bed Monday morning. Femur fracture. Possible femur. Neck pain. EXAM: CT HEAD WITHOUT CONTRAST CT CERVICAL SPINE WITHOUT CONTRAST TECHNIQUE: Multidetector CT imaging of the head and cervical spine was performed following the standard protocol without intravenous contrast. Multiplanar CT image reconstructions of the cervical spine were also generated. COMPARISON:  Multiple exams, including 05/01/2015 FINDINGS: CT HEAD FINDINGS Left anterior cerebral artery  distribution hypodensity tracking anterior and above the left lateral ventricle similar to distribution on prior MRI, compatible with encephalomalacia. Otherwise the cerebellum, brainstem, cerebral peduncles, thalami, and basal ganglia appear unremarkable. Basilar cisterns unremarkable. No intracranial hemorrhage, mass lesion, or acute CVA. 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 2.5 mm degenerative retrolisthesis at C3-4 with uncinate and facet spurring causing osseous foraminal stenosis at C3-4. The facet arthropathy on the left at C4-5 causes mild to moderate left foraminal stenosis. No cervical spine fracture or acute cervical spine findings. Incidental note is made of failure of fusion of the posterior arch of C1. There is moderate central narrowing of the thecal sac at C3-4 due to a central disc protrusion. Smaller disc protrusion centrally at C4-5 without impingement. IMPRESSION: 1. Evolutionary findings in the prior left anterior cerebral artery distribution infarct. No significant new/acute intracranial findings.   2. No acute cervical spine findings. Cervical spondylosis and degenerative disc disease contribute to impingement at C3-4 and C4-5 as detailed above. 3. 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 Failure of fusion of the posterior arch of C1. Electronically Signed   By: Walter  Liebkemann M.D.   On: 06/10/2015 19:58   Ct Cervical Spine Wo Contrast  06/10/2015  CLINICAL DATA:  Rolled out of bed Monday morning. Femur fracture. Possible femur. Neck pain. EXAM: CT HEAD WITHOUT CONTRAST CT CERVICAL SPINE WITHOUT CONTRAST TECHNIQUE: Multidetector CT imaging of the head and cervical spine was performed following the standard protocol without intravenous contrast. Multiplanar CT image reconstructions of the cervical spine were also generated. COMPARISON:  Multiple exams, including 05/01/2015 FINDINGS: CT HEAD FINDINGS Left anterior cerebral  artery distribution hypodensity tracking anterior and above the left lateral ventricle similar to distribution on prior MRI, compatible with encephalomalacia. Otherwise the cerebellum, brainstem, cerebral peduncles, thalami, and basal ganglia appear unremarkable. Basilar cisterns unremarkable. No intracranial hemorrhage, mass lesion, or acute CVA. 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 2.5 mm degenerative retrolisthesis at C3-4 with uncinate and facet spurring causing osseous foraminal stenosis at C3-4. The facet arthropathy on the left at C4-5 causes mild to moderate left foraminal stenosis. No cervical spine fracture or acute cervical spine findings. Incidental note is made of failure of fusion of the posterior arch of C1. There is moderate central narrowing of the thecal sac at C3-4 due to a central disc protrusion. Smaller disc protrusion centrally at C4-5 without impingement. IMPRESSION: 1. Evolutionary findings in the prior left anterior cerebral artery distribution infarct. No significant new/acute intracranial findings. 2. No acute cervical spine findings. Cervical spondylosis and degenerative disc disease contribute to impingement at C3-4 and C4-5 as detailed above. 3. 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 Failure of fusion of the posterior arch of C1. Electronically Signed   By: Walter  Liebkemann M.D.   On: 06/10/2015 19:58   Dg Femur, Min 2 Views Right  06/10/2015  CLINICAL DATA:  Fall with neck pain.  Initial encounter. EXAM: RIGHT FEMUR 2 VIEWS COMPARISON:  None. FINDINGS: Intertrochanteric right femur fracture with varus angulation and rotation. The right hip is located. Marked osteopenia. There is loosely organized medullary arc and ring calcification at the level of the distal femur compatible with enchondroma. No aggressive features noted. Atherosclerosis. IMPRESSION: Displaced intertrochanteric right femur fracture.  Electronically Signed   By: Jonathon  Watts M.D.   On: 06/10/2015 19:29    Positive ROS: All other systems have been reviewed and were otherwise negative with the exception of those mentioned in the HPI and as above.  Labs cbc  Recent Labs  06/10/15 1846 06/11/15 0138  WBC 10.2 9.7  HGB 10.7* 10.8*  HCT 34.8* 34.6*  PLT 292 262    Labs inflam No results for input(s): CRP in the last 72 hours.  Invalid input(s): ESR  Labs coag  Recent Labs  06/10/15 1846 06/11/15 0138  INR 1.79* 1.88*     Recent Labs  06/10/15 1846 06/11/15 0138  NA 141 140  K 3.2* 3.4*  CL 109 107  CO2 24 24  GLUCOSE 168* 147*  BUN 7 <5*  CREATININE 0.65 0.60  CALCIUM 8.7* 8.8*    Physical Exam: Filed Vitals:   06/11/15 0031 06/11/15 0554  BP: 178/74 155/74  Pulse: 71 66  Temp: 98.6 F (37 C) 98.4 F (36.9 C)  Resp: 16 18     General: Alert, no acute distress, patient has impaired speech due to recent stroke so communication is difficult Cardiovascular: No pedal edema Respiratory: No cyanosis, no use of accessory musculature GI: No organomegaly, abdomen is soft and non-tender Skin: No lesions in the area of chief complaint other than those listed below in MSK exam.  Neurologic: Sensation intact distally Psychiatric: Patient is competent for consent with normal mood and affect Lymphatic: No axillary or cervical lymphadenopathy  MUSCULOSKELETAL:  RUE and RLE are flaccid at baseline due to recent stroke.  R leg is externally rotated and shortened.  Mid swelling of the R hip.  Pain with movement of the leg.  Sensation intact with 2+ distal pulses.   Other extremities are atraumatic with painless ROM and NVI.  Assessment: R intertrochanteric hip fracture with his of recent stroke and GI bleed  Plan: Communication with the patient is difficult due to recent stroke.  Will need to touch base with the family to see if surgical intervention is desired.  Surgical intervention is  recommended to help increase mobilization and improve pain.  Patient is at higher risk of complication due to recent stroke.  Coumadin is on hold at this time.  Patient will remain on bedrest and NPO until a decision is reached.   Dartanyan Deasis Marie, PA-C Cell [phone]   06/11/2015 7:32 AM 

## 2015-06-12 NOTE — NC FL2 (Signed)
[place] LEVEL OF CARE SCREENING TOOL     IDENTIFICATION  Patient Name: [name] Birthdate: 09-01-1945 Sex: female Admission Date (Current Location): 06/10/2015  [facility] and [place] Number: Herbalist and Address:  [facility] [place], [place] [address] [place], [place] [address]      Provider Number: [id number]  Attending Physician Name and Address:  Charolette Forward, MD  Relative Name and Phone Number:       Current Level of Care: Hospital Recommended Level of Care: [place] Prior Approval Number:    Date Approved/Denied:   PASRR Number: [id number]  Discharge Plan: SNF    Current Diagnoses: Patient Active Problem List   Diagnosis Date Noted  . Fracture, intertrochanteric, right femur ([place]) 06/10/2015  . Stercoral ulcer of rectum 05/16/2015  . GI bleeding 05/15/2015  . GI bleed 05/13/2015  . BRBPR (bright red blood per rectum) 05/13/2015  . CHF (congestive heart failure) ([place])   . Diabetes ([place])   . Hypertension   . History of stroke   . Lower GI bleed   . Left-sided cerebrovascular accident (CVA) ([place]) 04/28/2015    Orientation ACTIVITIES/SOCIAL BLADDER RESPIRATION    Self, Time    Incontinent O2 (As needed) (2L [place])  BEHAVIORAL SYMPTOMS/MOOD NEUROLOGICAL BOWEL NUTRITION STATUS      Incontinent Diet (carb modified)  PHYSICIAN VISITS COMMUNICATION OF NEEDS Height & Weight Skin    Verbally   147 lbs. Normal          AMBULATORY STATUS RESPIRATION    Assist extensive O2 (As needed) (2L [place])      Personal Care Assistance Level of Assistance  Bathing, Feeding, Dressing Bathing Assistance: Maximum assistance Feeding assistance: Limited assistance Dressing Assistance: Maximum assistance      Functional Limitations Info                [place]  PT (By licensed PT)     PT Frequency: 5/wk             Additional Factors Info  Code Status, Allergies, Insulin  Sliding Scale Code Status Info: FULL Allergies Info: NKA   Insulin Sliding Scale Info: 3/day       Current Medications (06/12/2015):  This is the current hospital active medication list Current Facility-Administered Medications  Medication Dose Route Frequency Provider Last Rate Last Dose  . 0.9 %  sodium chloride infusion   Intravenous Continuous Charolette Forward, MD 50 mL/hr at 06/11/15 0030    . acetaminophen (TYLENOL) tablet 325 mg  325 mg Oral Q6H PRN Charolette Forward, MD   325 mg at 06/11/15 1750  . atorvastatin (LIPITOR) tablet 40 mg  40 mg Oral Daily Charolette Forward, MD   40 mg at 06/11/15 0913  . ceFAZolin (ANCEF) IVPB 2 g/50 mL premix  2 g Intravenous Q6H Brittney Kelly, PA-C      . ferrous sulfate tablet 325 mg  325 mg Oral TID WC Charolette Forward, MD   325 mg at 06/11/15 1750  . heparin injection 5,000 Units  5,000 Units Subcutaneous 3 times per day Lovett Calender, PA-C      . HYDROcodone-acetaminophen (NORCO/VICODIN) 5-325 MG per tablet 1 tablet  1 tablet Oral Q6H PRN Charolette Forward, MD   1 tablet at 06/12/15 0544  . HYDROmorphone (DILAUDID) 1 MG/ML injection           . HYDROmorphone (DILAUDID) injection 1 mg  1 mg Intravenous Q2H PRN Lovett Calender, PA-C      .  insulin aspart (novoLOG) injection 0-9 Units  0-9 Units Subcutaneous TID WC Charolette Forward, MD   2 Units at 06/11/15 1750  . menthol-cetylpyridinium (CEPACOL) lozenge 3 mg  1 lozenge Oral PRN Lovett Calender, PA-C       Or  . phenol (CHLORASEPTIC) mouth spray 1 spray  1 spray Mouth/Throat PRN Brittney Kelly, PA-C      . metoCLOPramide (REGLAN) tablet 5-10 mg  5-10 mg Oral Q8H PRN Lovett Calender, PA-C       Or  . metoCLOPramide (REGLAN) injection 5-10 mg  5-10 mg Intravenous Q8H PRN Brittney Kelly, PA-C      . metoprolol succinate (TOPROL-XL) 24 hr tablet 25 mg  25 mg Oral Daily Charolette Forward, MD   25 mg at 06/11/15 0919  . ondansetron (ZOFRAN) tablet 4 mg  4 mg Oral Q6H PRN Lovett Calender, PA-C       Or  . ondansetron (ZOFRAN)  injection 4 mg  4 mg Intravenous Q6H PRN Brittney Kelly, PA-C      . pantoprazole (PROTONIX) EC tablet 40 mg  40 mg Oral Q0600 Charolette Forward, MD   40 mg at 06/12/15 0544  . polyethylene glycol (MIRALAX / GLYCOLAX) packet 17 g  17 g Oral Daily PRN Charolette Forward, MD      . traMADol (ULTRAM) tablet 50 mg  50 mg Oral Q8H PRN Charolette Forward, MD   50 mg at 06/11/15 1242     Discharge Medications: Please see discharge summary for a list of discharge medications.  Relevant Imaging Results:  Relevant Lab Results:  Recent Labs    Additional Information SS#: [id number]  Cranford Mon, LCSW

## 2015-06-12 NOTE — Progress Notes (Signed)
Subjective:  Complains of right hip pain no chest pain or shortness of breath. INR are trending down received one dose of vitamin K. Scheduled for surgery later today Objective:  Vital Signs in the last 24 hours: Temp:  [98 F (36.7 C)-99.3 F (37.4 C)] 98.3 F (36.8 C) (12/08 0604) Pulse Rate:  [69-87] 74 (12/08 0604) Resp:  [16-18] 16 (12/08 0604) BP: (122-167)/(71-89) 167/71 mmHg (12/08 0604) SpO2:  [95 %-100 %] 100 % (12/08 0604)  Intake/Output from previous day: 12/07 0701 - 12/08 0700 In: 840 [P.O.:840] Out: 203 [Urine:202; Stool:1] Intake/Output from this shift:    Physical Exam: Neck: no adenopathy, no carotid bruit, no JVD and supple, symmetrical, trachea midline Lungs: clear to auscultation bilaterally Heart: regular rate and rhythm, S1, S2 normal and Soft systolic murmur noted Abdomen: soft, non-tender; bowel sounds normal; no masses,  no organomegaly Extremities: extremities normal, atraumatic, no cyanosis or edema and Shortening with external rotation right leg noted  Lab Results:  Recent Labs  06/10/15 1846 06/11/15 0138  WBC 10.2 9.7  HGB 10.7* 10.8*  PLT 292 262    Recent Labs  06/10/15 1846 06/11/15 0138  NA 141 140  K 3.2* 3.4*  CL 109 107  CO2 24 24  GLUCOSE 168* 147*  BUN 7 <5*  CREATININE 0.65 0.60   No results for input(s): TROPONINI in the last 72 hours.  Invalid input(s): CK, MB Hepatic Function Panel No results for input(s): PROT, ALBUMIN, AST, ALT, ALKPHOS, BILITOT, BILIDIR, IBILI in the last 72 hours. No results for input(s): CHOL in the last 72 hours. No results for input(s): PROTIME in the last 72 hours.  Imaging: Imaging results have been reviewed and Dg Chest 1 View  06/10/2015  CLINICAL DATA:  Fall, diabetes, hypertension, right hip intertrochanteric fracture EXAM: CHEST 1 VIEW COMPARISON:  05/16/2015 FINDINGS: Limited AP supine exam. This accounts for prominence of the mediastinum. Mild cardiomegaly with central vascular  congestion. Low lung volumes evident. Trachea is midline. No focal pneumonia or CHF. No effusion or pneumothorax. Bones are osteopenic. Degenerative changes of the spine with a mild scoliosis. IMPRESSION: Stable low volume chest exam. Electronically Signed   By: Jerilynn Mages.  Shick M.D.   On: 06/10/2015 19:30   Ct Head Wo Contrast  06/10/2015  CLINICAL DATA:  Rolled out of bed Monday morning. Femur fracture. Possible femur. Neck pain. EXAM: CT HEAD WITHOUT CONTRAST CT CERVICAL SPINE WITHOUT CONTRAST TECHNIQUE: Multidetector CT imaging of the head and cervical spine was performed following the standard protocol without intravenous contrast. Multiplanar CT image reconstructions of the cervical spine were also generated. COMPARISON:  Multiple exams, including 05/01/2015 FINDINGS: CT HEAD FINDINGS Left anterior cerebral artery distribution hypodensity tracking anterior and above the left lateral ventricle similar to distribution on prior MRI, compatible with encephalomalacia. Otherwise the cerebellum, brainstem, cerebral peduncles, thalami, and basal ganglia appear unremarkable. Basilar cisterns unremarkable. No intracranial hemorrhage, mass lesion, or acute CVA. 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 2.5 mm degenerative retrolisthesis at C3-4 with uncinate and facet spurring causing osseous foraminal stenosis at C3-4. The facet arthropathy on the left at C4-5 causes mild to moderate left foraminal stenosis. No cervical spine fracture or acute cervical spine findings. Incidental note is made of failure of fusion of the posterior arch of C1. There is moderate central narrowing of the thecal sac at C3-4 due to a central disc protrusion. Smaller disc protrusion centrally at C4-5 without impingement. IMPRESSION: 1. Evolutionary findings in the  prior left anterior cerebral artery distribution infarct. No significant new/acute intracranial findings. 2. No acute cervical spine findings.  Cervical spondylosis and degenerative disc disease contribute to impingement at C3-4 and C4-5 as detailed above. 3. 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 Failure of fusion of the posterior arch of C1. Electronically Signed   By: Van Clines M.D.   On: 06/10/2015 19:58   Ct Cervical Spine Wo Contrast  06/10/2015  CLINICAL DATA:  Rolled out of bed Monday morning. Femur fracture. Possible femur. Neck pain. EXAM: CT HEAD WITHOUT CONTRAST CT CERVICAL SPINE WITHOUT CONTRAST TECHNIQUE: Multidetector CT imaging of the head and cervical spine was performed following the standard protocol without intravenous contrast. Multiplanar CT image reconstructions of the cervical spine were also generated. COMPARISON:  Multiple exams, including 05/01/2015 FINDINGS: CT HEAD FINDINGS Left anterior cerebral artery distribution hypodensity tracking anterior and above the left lateral ventricle similar to distribution on prior MRI, compatible with encephalomalacia. Otherwise the cerebellum, brainstem, cerebral peduncles, thalami, and basal ganglia appear unremarkable. Basilar cisterns unremarkable. No intracranial hemorrhage, mass lesion, or acute CVA. 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 2.5 mm degenerative retrolisthesis at C3-4 with uncinate and facet spurring causing osseous foraminal stenosis at C3-4. The facet arthropathy on the left at C4-5 causes mild to moderate left foraminal stenosis. No cervical spine fracture or acute cervical spine findings. Incidental note is made of failure of fusion of the posterior arch of C1. There is moderate central narrowing of the thecal sac at C3-4 due to a central disc protrusion. Smaller disc protrusion centrally at C4-5 without impingement. IMPRESSION: 1. Evolutionary findings in the prior left anterior cerebral artery distribution infarct. No significant new/acute intracranial findings. 2. No acute cervical spine  findings. Cervical spondylosis and degenerative disc disease contribute to impingement at C3-4 and C4-5 as detailed above. 3. 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 Failure of fusion of the posterior arch of C1. Electronically Signed   By: Van Clines M.D.   On: 06/10/2015 19:58   Dg Femur, Min 2 Views Right  06/10/2015  CLINICAL DATA:  Fall with neck pain.  Initial encounter. EXAM: RIGHT FEMUR 2 VIEWS COMPARISON:  None. FINDINGS: Intertrochanteric right femur fracture with varus angulation and rotation. The right hip is located. Marked osteopenia. There is loosely organized medullary arc and ring calcification at the level of the distal femur compatible with enchondroma. No aggressive features noted. Atherosclerosis. IMPRESSION: Displaced intertrochanteric right femur fracture. Electronically Signed   By: Monte Fantasia M.D.   On: 06/10/2015 19:29    Cardiac Studies:  Assessment/Plan:  Right intertrochanteric displaced femur Fracture status post fall History of recent left CVA with right paresis  Mild coronary artery disease Hypertension Diabetes mellitus Hyperlipidemia Compensated congestive heart failure secondary to preserved LV systolic function Patent for him and oval History of tobacco abuse in the past Plan Continue present management Check PT/INR now.   LOS: 2 days    Charolette Forward 06/12/2015, 8:31 AM

## 2015-06-12 NOTE — Clinical Social Work Note (Signed)
Clinical Social Work Assessment  Patient Details  Name: [name] MRN: [id number] Date of Birth: 08-Apr-1946  Date of referral:  06/12/15               Reason for consult:  Facility Placement                Permission sought to share information with:  Facility Sport and exercise psychologist, Family Supports Permission granted to share information::  Yes, Verbal Permission Granted  Name::     Engineer, civil (consulting)::  [facility]  Relationship::  son  Contact Information:     Housing/Transportation Living arrangements for the past 2 months:  [place] of Information:  Patient, Adult Children Patient Interpreter Needed:  None Criminal Activity/Legal Involvement Pertinent to Current Situation/Hospitalization:  No - Comment as needed Significant Relationships:  Adult Children, Spouse Lives with:  Spouse Do you feel safe going back to the place where you live?  Yes Need for family participation in patient care:  Yes (Comment)  Care giving concerns:  Not enough support given current level of impairment   Facilities manager / plan:  CSW discussed plan for DC with pt son [name]  Employment status:  Retired Forensic scientist:  Information systems manager, Managed Care PT Recommendations:  Not assessed at this time Information / Referral to community resources:  [place]  Patient/Family's Response to care:  Pt son is agreeable to SNF- pt has been at [facility] for the past few weeks recovering from a stroke when the fall occurred- family recognizes need for additional PT following this fall.  Unsure if they wish to return to their current facility since pt did fall there.  Patient/Family's Understanding of and Emotional Response to Diagnosis, Current Treatment, and Prognosis:  No questions or concerns at this time.  Emotional Assessment Appearance:  Appears older than stated age Attitude/Demeanor/Rapport:  Unable to Assess Affect (typically observed):  Unable  to Assess Orientation:  Oriented to Self, Oriented to Place Alcohol / Substance use:  Not Applicable Psych involvement (Current and /or in the community):  No (Comment)  Discharge Needs  Concerns to be addressed:  Care Coordination Readmission within the last 30 days:  Yes Current discharge risk:  Physical Impairment Barriers to Discharge:  Continued Medical Work up   Cranford Mon, LCSW 06/12/2015, 3:32 PM

## 2015-06-12 NOTE — Interval H&P Note (Signed)
History and Physical Interval Note:  06/12/2015 7:31 AM  [name]  has presented today for surgery, with the diagnosis of Right intertroch fracture  The various methods of treatment have been discussed with the patient and family. After consideration of risks, benefits and other options for treatment, the patient has consented to  Procedure(s): INTRAMEDULLARY (IM) NAIL RIGHT HIP (Right) as a surgical intervention .  The patient's history has been reviewed, patient examined, no change in status, stable for surgery.  I have reviewed the patient's chart and labs.  Questions were answered to the patient's satisfaction.     [name]

## 2015-06-12 NOTE — Anesthesia Procedure Notes (Signed)
Procedure Name: Intubation Date/Time: 06/12/2015 11:26 AM Performed by: Shirlyn Goltz Pre-anesthesia Checklist: Patient identified, Emergency Drugs available, Patient being monitored and Suction available Patient Re-evaluated:Patient Re-evaluated prior to inductionOxygen Delivery Method: Circle system utilized Preoxygenation: Pre-oxygenation with 100% oxygen Intubation Type: IV induction Ventilation: Mask ventilation without difficulty and Oral airway inserted - appropriate to patient size Laryngoscope Size: Mac and 3 Grade View: Grade I Tube type: Oral Tube size: 7.0 mm Number of attempts: 1 Airway Equipment and Method: Stylet and LTA kit utilized Placement Confirmation: ETT inserted through vocal cords under direct vision,  positive ETCO2 and breath sounds checked- equal and bilateral Secured at: 22 cm Tube secured with: Tape Dental Injury: Teeth and Oropharynx as per pre-operative assessment

## 2015-06-12 NOTE — Transfer of Care (Signed)
Immediate Anesthesia Transfer of Care Note  Patient: [name]  Procedure(s) Performed: Procedure(s): INTRAMEDULLARY (IM) NAIL RIGHT HIP (Right)  Patient Location: PACU  Anesthesia Type:General  Level of Consciousness: awake and patient cooperative  Airway & Oxygen Therapy: Patient Spontanous Breathing and Patient connected to face mask oxygen  Post-op Assessment: Report given to RN, Post -op Vital signs reviewed and stable, Patient moving all extremities and Patient moving all extremities X 4 still weak on right same as preop  Post vital signs: Reviewed and stable  Last Vitals:  Filed Vitals:   06/12/15 0604 06/12/15 0941  BP: 167/71 162/75  Pulse: 74 67  Temp: 36.8 C 37.1 C  Resp: 16 18    Complications: No apparent anesthesia complications

## 2015-06-12 NOTE — Clinical Social Work Placement (Signed)
   CLINICAL SOCIAL WORK PLACEMENT  NOTE  Date:  06/12/2015  Patient Details  Name: [name] MRN: [id number] Date of Birth: 1946-06-08  Clinical Social Work is seeking post-discharge placement for this patient at the [place] level of care (*CSW will initial, date and re-position this form in  chart as items are completed):  Yes   Patient/family provided with [facility] Work Department's list of facilities offering this level of care within the geographic area requested by the patient (or if unable, by the patient's family).  Yes   Patient/family informed of their freedom to choose among providers that offer the needed level of care, that participate in Medicare, Medicaid or managed care program needed by the patient, have an available bed and are willing to accept the patient.  Yes   Patient/family informed of [facility]'s ownership interest in [facility] and [facility], as well as of the fact that they are under no obligation to receive care at these facilities.  PASRR submitted to EDS on       PASRR number received on       Existing PASRR number confirmed on 06/12/15     FL2 transmitted to all facilities in geographic area requested by pt/family on 06/12/15     FL2 transmitted to all facilities within larger geographic area on       Patient informed that his/her managed care company has contracts with or will negotiate with certain facilities, including the following:            Patient/family informed of bed offers received.  Patient chooses bed at       Physician recommends and patient chooses bed at      Patient to be transferred to   on  .  Patient to be transferred to facility by       Patient family notified on   of transfer.  Name of family member notified:        PHYSICIAN Please sign FL2     Additional Comment:    _______________________________________________ Cranford Mon, LCSW 06/12/2015, 3:41  PM

## 2015-06-12 NOTE — Op Note (Signed)
DATE OF SURGERY:  06/12/2015  TIME: 11:37 AM  PATIENT NAME:  [name]  AGE: 69 y.o.  PRE-OPERATIVE DIAGNOSIS:  Right intertroch fracture  POST-OPERATIVE DIAGNOSIS:  SAME  PROCEDURE:  INTRAMEDULLARY (IM) NAIL RIGHT HIP  SURGEON:  Labella Zahradnik D  ASSISTANT:  Lovett Calender, PA-C, She was present and scrubbed throughout the case, critical for completion in a timely fashion, and for retraction, instrumentation, and closure.   OPERATIVE IMPLANTS: Stryker Gamma Nail  PREOPERATIVE INDICATIONS:  [name] is a 69 y.o. year old who fell and suffered a hip fracture. She was brought into the ER and then admitted and optimized and then elected for surgical intervention.    The risks benefits and alternatives were discussed with the patient including but not limited to the risks of nonoperative treatment, versus surgical intervention including infection, bleeding, nerve injury, malunion, nonunion, hardware prominence, hardware failure, need for hardware removal, blood clots, cardiopulmonary complications, morbidity, mortality, among others, and they were willing to proceed.    OPERATIVE PROCEDURE:  The patient was brought to the operating room and placed in the supine position. General anesthesia was administered. She was placed on the fracture table.  Closed reduction was performed under C-arm guidance. Time out was then performed after sterile prep and drape. She received preoperative antibiotics.  Incision was made proximal to the greater trochanter. A guidewire was placed in the appropriate position. Confirmation was made on AP and lateral views. The above-named nail was opened. I opened the proximal femur with a reamer. I then placed the nail by hand easily down. I did not need to ream the femur.  Once the nail was completely seated, I placed a guidepin into the femoral head into the center center position. I measured the length, and then reamed the lateral cortex and up into  the head. I then placed the lag screw. Slight compression was applied. Anatomic fixation achieved. Bone quality was mediocre.  I then secured the proximal interlocking bolt, and took off a half a turn, and then removed the instruments, and took final C-arm pictures AP and lateral the entire length of the leg.   Anatomic reconstruction was achieved, and the wounds were irrigated copiously and closed with Vicryl followed by staples and sterile gauze for the skin. The patient was awakened and returned to PACU in stable and satisfactory condition. There no complications and the patient tolerated the procedure well.  She will be weightbearing as tolerated, and will be on chemical px  for a period of four weeks after discharge.   Edmonia Lynch, M.D.    This note was generated using a template and dragon dictation system. In light of that, I have reviewed the note and all aspects of it are applicable to this case. Any dictation errors are due to the computerized dictation system.

## 2015-06-12 NOTE — Progress Notes (Signed)
Called Nicole [facility] Mrs Faraci's son to inform him surgery was being moved up

## 2015-06-13 ENCOUNTER — Encounter (HOSPITAL_COMMUNITY): Payer: Self-pay | Admitting: Orthopedic Surgery

## 2015-06-13 LAB — GLUCOSE, CAPILLARY
GLUCOSE-CAPILLARY: 319 mg/dL — AB (ref 65–99)
Glucose-Capillary: 137 mg/dL — ABNORMAL HIGH (ref 65–99)
Glucose-Capillary: 166 mg/dL — ABNORMAL HIGH (ref 65–99)
Glucose-Capillary: 229 mg/dL — ABNORMAL HIGH (ref 65–99)

## 2015-06-13 LAB — PROTIME-INR
INR: 1.14 (ref 0.00–1.49)
PROTHROMBIN TIME: 14.7 s (ref 11.6–15.2)

## 2015-06-13 MED ORDER — WARFARIN - PHARMACIST DOSING INPATIENT
Freq: Every day | Status: DC
  Administered 2015-06-14 – 2015-06-15 (×2)

## 2015-06-13 MED ORDER — WARFARIN SODIUM 7.5 MG PO TABS: 7.5000 mg | ORAL_TABLET | Freq: Once | ORAL | Status: AC

## 2015-06-13 NOTE — Evaluation (Signed)
Physical Therapy Evaluation Patient Details Name: [name] MRN: [id number] DOB: 19-May-1946 Today's Date: 06/13/2015   History of Present Illness  69 y.o female who was at [facility] for CVA rehab when she fell off bed and suffered R intertrochanteric fx and is s/p R IM nailing (06/12/15). PMH: CVA with R hemiparesis and aphasia (Oct 2016) and hospital stay in November with rectal ulcer with pt having hypotensive shock and  CPR having to be performed, CAD, DM  Clinical Impression  Pt admitted with above diagnosis. Pt currently with functional limitations due to the deficits listed below (see PT Problem List).  Pt will benefit from skilled PT to increase their independence and safety with mobility to allow discharge to the venue listed below.  Pt requires +2 for bed mobility and leans to the R in sitting despite surgery on R hip. Pt limited by recent CVA and R hemiparesis, decreased proprioception and aphasia. Recommend SNF.     Follow Up Recommendations SNF    Equipment Recommendations  None recommended by PT    Recommendations for Other Services       Precautions / Restrictions Precautions Precautions: Fall Precaution Comments: R Hemiparesis Restrictions Weight Bearing Restrictions: Yes RLE Weight Bearing: Weight bearing as tolerated Other Position/Activity Restrictions: Monitor HR with activity      Mobility  Bed Mobility Overal bed mobility: +2 for physical assistance;Needs Assistance Bed Mobility: Supine to Sit;Sit to Supine     Supine to sit: +2 for physical assistance;Max assist Sit to supine: Total assist;+2 for physical assistance   General bed mobility comments: Pt attempting to help minimally with supine > sit and none with sit >supine  Transfers                 General transfer comment: Did not attempt transfer due to pain and weakness  Ambulation/Gait                Stairs            Wheelchair Mobility    Modified Rankin (Stroke  Patients Only)       Balance     Sitting balance-Leahy Scale: Poor Sitting balance - Comments: Attempted to get pt to lean to L to take pressure off of R side/hip, but pt pushes heavily to R        Standing balance comment: Did not attempt                             Pertinent Vitals/Pain Faces Pain Scale: Hurts worst Pain Location: R LE and appears that R UE movement is uncomfortable as well Pain Descriptors / Indicators: Crying;Grimacing;Guarding;Operative site guarding Pain Intervention(s): Premedicated before session;Repositioned;Patient requesting pain meds-RN notified;Limited activity within patient's tolerance;Monitored during session    Home Living Family/patient expects to be discharged to:: Skilled nursing facility                      Prior Function Level of Independence: Needs assistance   Gait / Transfers Assistance Needed: was ambulatory prior to recent CVA in October, during a hospitalization in November she was MAX A with SPT with PT staff.           Hand Dominance   Dominant Hand: Right    Extremity/Trunk Assessment   Upper Extremity Assessment: RUE deficits/detail RUE Deficits / Details: Flaccid R UE         Lower Extremity Assessment: RLE deficits/detail RLE  Deficits / Details: No active movement noted, but unsure how much is from CVA and how much is pain from fx       Communication   Communication: Expressive difficulties  Cognition Arousal/Alertness: Awake/alert Behavior During Therapy: Anxious Overall Cognitive Status: Difficult to assess                      General Comments General comments (skin integrity, edema, etc.): HR increased to upper 130s with sitting EOB    Exercises General Exercises - Lower Extremity Ankle Circles/Pumps: PROM;Right;10 reps Heel Slides: AAROM;Right;5 reps      Assessment/Plan    PT Assessment Patient needs continued PT services  PT Diagnosis Hemiplegia dominant  side;Acute pain   PT Problem List Decreased strength;Decreased range of motion;Decreased activity tolerance;Decreased balance;Decreased mobility;Decreased coordination;Decreased cognition;Decreased knowledge of precautions;Impaired tone;Pain  PT Treatment Interventions DME instruction;Functional mobility training;Therapeutic activities;Therapeutic exercise;Neuromuscular re-education;Balance training;Patient/family education;Wheelchair mobility training   PT Goals (Current goals can be found in the Care Plan section) Acute Rehab PT Goals PT Goal Formulation: Patient unable to participate in goal setting Time For Goal Achievement: 06/27/15 Potential to Achieve Goals: Fair    Frequency Min 2X/week   Barriers to discharge        Co-evaluation               End of Session   Activity Tolerance: Patient limited by pain Patient left: in bed;with call bell/phone within reach;with bed alarm set Nurse Communication: Mobility status         Time: [id number] PT Time Calculation (min) (ACUTE ONLY): 13 min   Charges:   PT Evaluation $Initial PT Evaluation Tier I: 1 Procedure     PT G Codes:        [name] 06/13/2015, 1:06 PM

## 2015-06-13 NOTE — Progress Notes (Signed)
Subjective:  Awake in no distress tolerated intramedullary nail right hip.  Objective:  Vital Signs in the last 24 hours: Temp:  [98.2 F (36.8 C)-99.8 F (37.7 C)] 98.2 F (36.8 C) (12/09 0422) Pulse Rate:  [63-139] 103 (12/09 0422) Resp:  [10-20] 18 (12/09 0422) BP: (115-180)/(80-97) 129/87 mmHg (12/09 0422) SpO2:  [92 %-100 %] 94 % (12/09 0422)  Intake/Output from previous day: 12/08 0701 - 12/09 0700 In: 650 [I.V.:650] Out: 1875 [Urine:1800; Blood:75] Intake/Output from this shift: Total I/O In: -  Out: 240 [Urine:240]  Physical Exam: Neck: no adenopathy, no carotid bruit, no JVD and supple, symmetrical, trachea midline Lungs: clear to auscultation bilaterally Heart: regular rate and rhythm, S1, S2 normal and Soft systolic murmur noted Abdomen: soft, non-tender; bowel sounds normal; no masses,  no organomegaly Extremities: extremities normal, atraumatic, no cyanosis or edema and Right hip surgical site dry dressing  Lab Results:  Recent Labs  06/11/15 0138 06/12/15 1522  WBC 9.7 11.3*  HGB 10.8* 10.2*  PLT 262 232    Recent Labs  06/10/15 1846 06/11/15 0138 06/12/15 1522  NA 141 140  --   K 3.2* 3.4*  --   CL 109 107  --   CO2 24 24  --   GLUCOSE 168* 147*  --   BUN 7 <5*  --   CREATININE 0.65 0.60 0.70   No results for input(s): TROPONINI in the last 72 hours.  Invalid input(s): CK, MB Hepatic Function Panel No results for input(s): PROT, ALBUMIN, AST, ALT, ALKPHOS, BILITOT, BILIDIR, IBILI in the last 72 hours. No results for input(s): CHOL in the last 72 hours. No results for input(s): PROTIME in the last 72 hours.  Imaging: Imaging results have been reviewed and Pelvis Portable  06/12/2015  CLINICAL DATA:  Postop nail placement in the right femur EXAM: PORTABLE PELVIS 1-2 VIEWS COMPARISON:  None FINDINGS: Left femoral intramedullary nail placement with an interlocking femoral neck screw transfixing an intertrochanteric fracture. There is 5.5 mm  of distraction of the intertrochanteric fracture cleft. There is no hip dislocation. There are postsurgical changes in the surrounding soft tissues. IMPRESSION: Left femoral intramedullary nail placement with an interlocking femoral neck screw transfixing an intertrochanteric fracture with 5.5 mm of distraction of the intertrochanteric fracture cleft. Electronically Signed   By: Kathreen Devoid   On: 06/12/2015 14:34   Dg C-arm 1-60 Min  06/12/2015  CLINICAL DATA:  IM nail, right femur, hip fracture. EXAM: DG C-ARM 61-120 MIN; RIGHT FEMUR 2 VIEWS FLUOROSCOPY TIME:  0 minutes 37 seconds. COMPARISON:  06/10/2015. FINDINGS: 3 intraoperative fluoroscopic spot views of the right hip are submitted. A compression screw and intra medullary rod traverse a right intertrochanteric femur fracture. Fracture fragments are in near anatomic alignment. IMPRESSION: ORIF of an intertrochanteric right femur fracture with near anatomic alignment. Electronically Signed   By: Lorin Picket M.D.   On: 06/12/2015 13:20   Dg Femur, Min 2 Views Right  06/12/2015  CLINICAL DATA:  IM nail, right femur, hip fracture. EXAM: DG C-ARM 61-120 MIN; RIGHT FEMUR 2 VIEWS FLUOROSCOPY TIME:  0 minutes 37 seconds. COMPARISON:  06/10/2015. FINDINGS: 3 intraoperative fluoroscopic spot views of the right hip are submitted. A compression screw and intra medullary rod traverse a right intertrochanteric femur fracture. Fracture fragments are in near anatomic alignment. IMPRESSION: ORIF of an intertrochanteric right femur fracture with near anatomic alignment. Electronically Signed   By: Lorin Picket M.D.   On: 06/12/2015 13:20   Dg Femur  [address], [place] 2 Views Right  06/12/2015  CLINICAL DATA:  RIGHT femoral nail fixation EXAM: RIGHT FEMUR PORTABLE 1 VIEW COMPARISON:  Plain film 06/10/2015 FINDINGS: Interval intra medullary nail fixation of RIGHT intertrochanteric fracture. Fixation nail and dynamic hip screw appear properly located. No distal locking  screw present. No fracture or dislocation. Postsurgical subcutaneous gas noted. IMPRESSION: No complication following intra medullary nail fixation of RIGHT intertrochanteric fracture Electronically Signed   By: Suzy Bouchard M.D.   On: 06/12/2015 13:46    Cardiac Studies:  Assessment/Plan:  Right intertrochanteric displaced femur Fracture status post fall status post intramedullary nail right hip postop day 1 doing well History of recent left CVA with right paresis  Mild coronary artery disease Hypertension Diabetes mellitus Hyperlipidemia Compensated congestive heart failure secondary to preserved LV systolic function Patent for him and oval History of tobacco abuse in the past Plan Restart Coumadin as per pharmacy protocol  LOS: 3 days    Charolette Forward 06/13/2015, 12:53 PM

## 2015-06-13 NOTE — Progress Notes (Signed)
Pt's HR has been elevated throughout shift ranging from low 100s-118 with pt laying in bed. Pt has shown physical indicators of pain, and has been medicated accordingly. Pt's son, [name], expressed concern about elevated HR, RN educated pt on how pain can influence HR and that pt given metoprolol this am. Notified Dr. Terrence Dupont, no new orders received. Will continue to monitor.   [place], Jerry Caras

## 2015-06-13 NOTE — Clinical Social Work Note (Signed)
Clinical Social Worker has presented bed offers to patient's son, [name]. Pt's son also requesting for bed offers to be emailed to [email or web address].   CSW has emailed bed offers. Pt's son planning to visit facilities and make final decision afterwards.  CSW remains available as needed.   Glendon Axe, MSW, LCSWA [phone] 06/13/2015 10:15 AM

## 2015-06-13 NOTE — Care Management (Signed)
Utilization review completed. Taylin Mans, RN Case Manager [phone]. 

## 2015-06-13 NOTE — Progress Notes (Signed)
ANTICOAGULATION CONSULT NOTE - Initial Consult  Pharmacy Consult for Coumadin Indication: stroke  No Known Allergies  Vital Signs: Temp: 98.2 F (36.8 C) (12/09 0422) Temp Source: Oral (12/09 0422) BP: 129/87 mmHg (12/09 0422) Pulse Rate: 103 (12/09 0422)  Labs:  Recent Labs  06/10/15 1846 06/11/15 0138 06/11/15 1843 06/12/15 0318 06/12/15 1522  HGB 10.7* 10.8*  --   --  10.2*  HCT 34.8* 34.6*  --   --  32.7*  PLT 292 262  --   --  232  LABPROT 20.8* 21.5* 22.7* 20.1* 15.6*  INR 1.79* 1.88* 2.01* 1.71* 1.22  CREATININE 0.65 0.60  --   --  0.70    CrCl cannot be calculated (Unknown ideal weight.).   Medical History: Past Medical History  Diagnosis Date  . CHF (congestive heart failure) (HCC)     EF [id number], grade 1 diastolic dysfunction per echo 04/2015  . Diabetes ([place]) 2016    Type II. On insulin  . Hypertension   . CVA (cerebral infarction) 04/2015    Started Plavix 04/2015  . Patent foramen ovale 04/2015    Started on warfarin 04/2015  . Enchondroma of bone 2011    left femur.   . Stercoral ulcer of rectum 05/16/2015    Assessment: 69 year old female on Coumadin prior to admission for stroke history now resuming s/p IM nail of the right hip Dose PTA = 5 mg daily except 7.5 mg Monday and Thursday  Goal of Therapy:  INR 2-3 Monitor platelets by anticoagulation protocol: Yes   Plan:  Coumadin 7.5 mg po x 1 dose Daily INR  Thank you Anette Guarneri, PharmD [phone]  06/13/2015,8:31 AM

## 2015-06-13 NOTE — Care Management Important Message (Signed)
Important Message  Patient Details  Name: [name] MRN: [id number] Date of Birth: July 11, 1945   Medicare Important Message Given:  Yes    Memory Argue 06/13/2015, 4:37 PM

## 2015-06-13 NOTE — Clinical Documentation Improvement (Signed)
Cardiology Orthopedic Critical Care  Can the diagnosis of CHF be further specified?    Acuity - Acute, Chronic, Acute on Chronic   Type - Systolic, Diastolic, Systolic and Diastolic  Other  Clinically Undetermined  Document any associated diagnoses/conditions  Supporting Information: Current documentation notes history of CHF. CXR 05/16/15 states "mild cardiomegaly" and "no focal pneumonia or CHF".   Please provide the acuity and type of CHF, if known, this admission and document in your future progress note and discharge summary.     Please exercise your independent, professional judgment when responding. A specific answer is not anticipated or expected.Please update your documentation within the medical record to reflect your response to this query.  Thank you, Mateo Flow, RN [phone] Clinical Documentation Specialist

## 2015-06-13 NOTE — Anesthesia Postprocedure Evaluation (Signed)
Anesthesia Post Note  Patient: [name]  Procedure(s) Performed: Procedure(s) (LRB): INTRAMEDULLARY (IM) NAIL RIGHT HIP (Right)  Patient location during evaluation: PACU Anesthesia Type: General Level of consciousness: awake and alert Pain management: pain level controlled Vital Signs Assessment: post-procedure vital signs reviewed and stable Respiratory status: spontaneous breathing, nonlabored ventilation, respiratory function stable and patient connected to nasal cannula oxygen Cardiovascular status: blood pressure returned to baseline and stable Postop Assessment: no signs of nausea or vomiting Anesthetic complications: no    Last Vitals:  Filed Vitals:   06/13/15 0422 06/13/15 1300  BP: 129/87 105/75  Pulse: 103 111  Temp: 36.8 C 36.9 C  Resp: 18 18    Last Pain:  Filed Vitals:   06/13/15 1320  PainSc: 2                  Geselle Cardosa DAVID

## 2015-06-13 NOTE — Progress Notes (Signed)
OT Cancellation Note  Patient Details Name: [name] MRN: [id number] DOB: 01/20/1946   Cancelled Treatment:    Reason Eval/Treat Not Completed: OT screened, no needs identified, will sign off. Pt resided in a SNF PTA and is planning d/c to SNF. No acute OT needs identified, will defer all further OT needs to next venue of care; signing off at this time. Please re-consult if change in medical status occurs. Thank you for this referral.   Binnie Kand M.S., OTR/L Pager: [phone]  06/13/2015, 12:24 PM

## 2015-06-13 NOTE — Progress Notes (Signed)
     Subjective:  POD#1 IM nail of the R hip. Patient reports pain as mild to moderate.  Resting comfortably in the bed this morning.  Son is at the bedside.  He is requesting a dose of mirolax for his mother as this helps with BM.  States she has not had a BM since 12/7.    Objective:   VITALS:   Filed Vitals:   06/12/15 1537 06/12/15 2005 06/13/15 0015 06/13/15 0422  BP: 137/87 122/85 115/80 129/87  Pulse: 83 107 115 103  Temp: 98.9 F (37.2 C) 99.8 F (37.7 C) 98.9 F (37.2 C) 98.2 F (36.8 C)  TempSrc:  Oral Oral Oral  Resp: 16 20 20 18   SpO2: 100% 100% 94% 94%   R sided weakness due to stroke.  Limited ability to communicate.  ABD soft Neurovascular intact Sensation intact distally Intact pulses distally Incision: dressing C/D/I   Lab Results  Component Value Date   WBC 11.3* 06/12/2015   HGB 10.2* 06/12/2015   HCT 32.7* 06/12/2015   MCV 90.3 06/12/2015   PLT 232 06/12/2015   BMET    Component Value Date/Time   NA 140 06/11/2015 0138   K 3.4* 06/11/2015 0138   CL 107 06/11/2015 0138   CO2 24 06/11/2015 0138   GLUCOSE 147* 06/11/2015 0138   BUN <5* 06/11/2015 0138   CREATININE 0.70 06/12/2015 1522   CALCIUM 8.8* 06/11/2015 0138   GFRNONAA >60 06/12/2015 1522   GFRAA >60 06/12/2015 1522     Assessment/Plan: 1 Day Post-Op   Active Problems:   Fracture, intertrochanteric, right femur ([place])   Up with therapy WBAT in the RLE.  Will see how the patient does with PT.  Mainly bed to chair transfers prior to fall, but sons state that the facility she was at had been making progress in mobilization and strength of the right side.  Heparin bridge to coumadin for DVT prophylaxis Discharge to SNF expected   Ziere Docken Marie 06/13/2015, 7:52 AM Cell [phone]

## 2015-06-14 LAB — CBC
HEMATOCRIT: 30.4 % — AB (ref 36.0–46.0)
Hemoglobin: 9.3 g/dL — ABNORMAL LOW (ref 12.0–15.0)
MCH: 27.4 pg (ref 26.0–34.0)
MCHC: 30.6 g/dL (ref 30.0–36.0)
MCV: 89.7 fL (ref 78.0–100.0)
Platelets: 247 10*3/uL (ref 150–400)
RBC: 3.39 MIL/uL — ABNORMAL LOW (ref 3.87–5.11)
RDW: 14 % (ref 11.5–15.5)
WBC: 9.7 10*3/uL (ref 4.0–10.5)

## 2015-06-14 LAB — GLUCOSE, CAPILLARY
GLUCOSE-CAPILLARY: 285 mg/dL — AB (ref 65–99)
Glucose-Capillary: 212 mg/dL — ABNORMAL HIGH (ref 65–99)
Glucose-Capillary: 249 mg/dL — ABNORMAL HIGH (ref 65–99)
Glucose-Capillary: 193 mg/dL — ABNORMAL HIGH (ref 65–99)

## 2015-06-14 MED ORDER — TRAMADOL HCL 50 MG PO TABS
50.0000 mg | ORAL_TABLET | Freq: Four times a day (QID) | ORAL | Status: DC | PRN
  Administered 2015-06-14 – 2015-06-15 (×3): 50 mg via ORAL
  Filled 2015-06-14 (×3): qty 1

## 2015-06-14 MED ORDER — WARFARIN SODIUM 7.5 MG PO TABS
7.5000 mg | ORAL_TABLET | Freq: Once | ORAL | Status: AC
Start: 2015-06-14 — End: 2015-06-14
  Administered 2015-06-14: 7.5 mg via ORAL
  Filled 2015-06-14: qty 1

## 2015-06-14 MED ORDER — INSULIN GLARGINE 100 UNIT/ML ~~LOC~~ SOLN
5.0000 [IU] | Freq: Every day | SUBCUTANEOUS | Status: DC
  Administered 2015-06-14: 5 [IU] via SUBCUTANEOUS
  Filled 2015-06-14 (×2): qty 0.05

## 2015-06-14 NOTE — Progress Notes (Signed)
Per MD, Pt ready for d/c on Monday.  Bernita Raisin, [place] Social Work [phone]

## 2015-06-14 NOTE — Progress Notes (Signed)
Orthopaedic Trauma Service Progress Note  Subjective  Doing ok Not verbalizing much but able to note that she is in pain at her R hip   ROS Difficult to obtain   Objective   BP 121/73 mmHg  Pulse 96  Temp(Src) 98.5 F (36.9 C) (Oral)  Resp 18  SpO2 95%  Intake/Output      12/09 0701 - 12/10 0700 12/10 0701 - 12/11 0700   P.O. 560    I.V. 3353.3    Total Intake 3913.3     Urine 1550    Blood     Total Output 1550     Net +2363.3            Labs  CBG (last 3)   Recent Labs  06/13/15 1633 06/13/15 2129 06/14/15 0639  GLUCAP 229* 319* 212*   Results for JOLIN, KAIN (MRN [id number]) as of 06/14/2015 09:53  Ref. Range 06/11/2015 01:38  Hemoglobin A1C Latest Ref Range: 4.8-5.6 % 7.3 (H)     Exam  Gen: awake, in bed, able to note that she is in pain, appears uncomfortable Lungs: clear anterior fields Cardiac: s1 and s2, RRR Abd: + BS Ext:       Right Lower Extremity   Dressings c/d/i  Ext warm  + DP pulse              Pt not moving toes or ankles- stroke related  Reports intact sensation distally    Assessment and Plan   POD/HD#: 2  69 y/o female s/p IMN R hip fracture   1. R hip fracture s/p IMN  WBAT   No ROM restrictions  Dressing changes as needed  PT/OT  2. Pain management:  Continue with low dose norco  Ultram 50 mg po q6h prn   3. ABL anemia/Hemodynamics  Mild ABL anemia- expected  Recheck labs  4. Medical issues   HTN- per primary team   DM   Pt only on SSI right now   Sugars consistently >200   Restart home lantus 10 mg sq at bedtime   Continue with current SSI - closely mimics home regimen   hgb a1c is 7.3%    Blood sugars >200 will directly impact pts ability to heal and also increases risk of infection   5. DVT/PE prophylaxis:  Coumadin  6. ID:   periop abx completed  7. Metabolic Bone Disease:  Ordered vitamin D on pt  Pt definition has osteoporosis given low energy hip fracture  tx options should be  reviewed with PCP   Outpt DEXA will be needed in next 4-6 weeks  8. Activity:  Continue with therapy  9. FEN/GI prophylaxis/Foley/Lines:  Mechanical soft diet   10. Dispo:  Ortho issues stable  SNF when bed available  Follow up [place], PA-C [facility] [phone] (O) 06/14/2015 9:50 AM

## 2015-06-14 NOTE — Progress Notes (Signed)
ANTICOAGULATION CONSULT NOTE - Follow Up Consult  Pharmacy Consult for warfarin Indication: stroke  No Known Allergies  Patient Measurements:   Vital Signs: Temp: 98.5 F (36.9 C) (12/10 0634) Temp Source: Oral (12/10 0634) BP: 124/75 mmHg (12/10 1006) Pulse Rate: 78 (12/10 1006)  Labs:  Recent Labs  06/12/15 0318 06/12/15 1522 06/13/15 0945  HGB  --  10.2*  --   HCT  --  32.7*  --   PLT  --  232  --   LABPROT 20.1* 15.6* 14.7  INR 1.71* 1.22 1.14  CREATININE  --  0.70  --     CrCl cannot be calculated (Unknown ideal weight.).  Assessment:   69 y/o F on warfarin PTA for stroke hx now resuming. Pt is awaiting SNF placement which will likely be on Monday. Pt has not received any doses of warfarin while here. Most recent INR 1.14 on 12/09. S/p IM nail of the right hip. Hgb 10.2, plts 232.   Goal of Therapy:  INR 2-3 Monitor platelets by anticoagulation protocol: Yes   Plan:  Warfarin po 7.5mg  x 1 dose tonight (will need to restart home dose prior to SNF placement) Daily INR; monitor for S&S of bleed  Angela Burke, PharmD Pharmacy Resident Pager: [phone] 06/14/2015,11:17 AM

## 2015-06-14 NOTE — Progress Notes (Signed)
Spoke with Pt's son, [name].  Mr. Shon Baton stated that he received the bed offers and that he is touring facilities this weekend.  He is leaning towards [facility].  Mr. Shon Baton stated that he was told by MD that Pt would be in the hospital over the weekend and to expect a Monday d/c.  SW and Mr. Shon Baton discussed that, if Pt is ready over the weekend, they will need to pick a facility.  Mr. Shon Baton voiced an understanding.  SW thanked Mr. Shon Baton for his time.  Bernita Raisin, [place] Social Work [phone]

## 2015-06-14 NOTE — Progress Notes (Signed)
Subjective:  Complains of right hip pain.  Objective:  Vital Signs in the last 24 hours: Temp:  [98.3 F (36.8 C)-98.5 F (36.9 C)] 98.5 F (36.9 C) (12/10 0634) Pulse Rate:  [78-111] 78 (12/10 1006) Resp:  [17-18] 18 (12/10 0634) BP: (105-124)/(73-81) 124/75 mmHg (12/10 1006) SpO2:  [95 %-98 %] 95 % (12/10 0634)  Intake/Output from previous day: 12/09 0701 - 12/10 0700 In: 3913.3 [P.O.:560; I.V.:3353.3] Out: 1550 [Urine:1550] Intake/Output from this shift:    Physical Exam: Neck: no adenopathy, no carotid bruit, no JVD and supple, symmetrical, trachea midline Lungs: clear to auscultation bilaterally Heart: regular rate and rhythm, S1, S2 normal and Soft systolic murmur noted Abdomen: soft, non-tender; bowel sounds normal; no masses,  no organomegaly Extremities: extremities normal, atraumatic, no cyanosis or edema and Right hip dressing dry  Lab Results:  Recent Labs  06/12/15 1522  WBC 11.3*  HGB 10.2*  PLT 232    Recent Labs  06/12/15 1522  CREATININE 0.70   No results for input(s): TROPONINI in the last 72 hours.  Invalid input(s): CK, MB Hepatic Function Panel No results for input(s): PROT, ALBUMIN, AST, ALT, ALKPHOS, BILITOT, BILIDIR, IBILI in the last 72 hours. No results for input(s): CHOL in the last 72 hours. No results for input(s): PROTIME in the last 72 hours.  Imaging: Imaging results have been reviewed and Pelvis Portable  06/12/2015  CLINICAL DATA:  Postop nail placement in the right femur EXAM: PORTABLE PELVIS 1-2 VIEWS COMPARISON:  None FINDINGS: Left femoral intramedullary nail placement with an interlocking femoral neck screw transfixing an intertrochanteric fracture. There is 5.5 mm of distraction of the intertrochanteric fracture cleft. There is no hip dislocation. There are postsurgical changes in the surrounding soft tissues. IMPRESSION: Left femoral intramedullary nail placement with an interlocking femoral neck screw transfixing an  intertrochanteric fracture with 5.5 mm of distraction of the intertrochanteric fracture cleft. Electronically Signed   By: Kathreen Devoid   On: 06/12/2015 14:34   Dg Femur Port, Min 2 Views Right  06/12/2015  CLINICAL DATA:  RIGHT femoral nail fixation EXAM: RIGHT FEMUR PORTABLE 1 VIEW COMPARISON:  Plain film 06/10/2015 FINDINGS: Interval intra medullary nail fixation of RIGHT intertrochanteric fracture. Fixation nail and dynamic hip screw appear properly located. No distal locking screw present. No fracture or dislocation. Postsurgical subcutaneous gas noted. IMPRESSION: No complication following intra medullary nail fixation of RIGHT intertrochanteric fracture Electronically Signed   By: Suzy Bouchard M.D.   On: 06/12/2015 13:46    Cardiac Studies:  Assessment/Plan:  Right intertrochanteric displaced femur Fracture status post fall status post intramedullary nail right hip postop day 2 doing well History of recent left CVA with right paresis  Mild coronary artery disease Hypertension Diabetes mellitus Hyperlipidemia Compensated congestive heart failure secondary to preserved LV systolic function Patent foramen ovole History of tobacco abuse in the past Postop anemia Plan Continue present management Family deciding regarding skilled nursing facility   LOS: 4 days    Charolette Forward 06/14/2015, 12:42 PM

## 2015-06-15 LAB — COMPREHENSIVE METABOLIC PANEL
ALK PHOS: 58 U/L (ref 38–126)
ALT: 23 U/L (ref 14–54)
ANION GAP: 8 (ref 5–15)
AST: 24 U/L (ref 15–41)
Albumin: 2.7 g/dL — ABNORMAL LOW (ref 3.5–5.0)
BILIRUBIN TOTAL: 0.7 mg/dL (ref 0.3–1.2)
BUN: 6 mg/dL (ref 6–20)
CALCIUM: 8.9 mg/dL (ref 8.9–10.3)
CO2: 27 mmol/L (ref 22–32)
CREATININE: 0.66 mg/dL (ref 0.44–1.00)
Chloride: 101 mmol/L (ref 101–111)
GFR calc non Af Amer: >60 mL/min (ref >60)
GLUCOSE: 250 mg/dL — AB (ref 65–99)
Potassium: 3.8 mmol/L (ref 3.5–5.1)
SODIUM: 136 mmol/L (ref 135–145)
Total Protein: 5.6 g/dL — ABNORMAL LOW (ref 6.5–8.1)

## 2015-06-15 LAB — GLUCOSE, CAPILLARY
GLUCOSE-CAPILLARY: 201 mg/dL — AB (ref 65–99)
GLUCOSE-CAPILLARY: 217 mg/dL — AB (ref 65–99)
GLUCOSE-CAPILLARY: 194 mg/dL — AB (ref 65–99)
Glucose-Capillary: 205 mg/dL — ABNORMAL HIGH (ref 65–99)
Glucose-Capillary: 283 mg/dL — ABNORMAL HIGH (ref 65–99)

## 2015-06-15 LAB — CBC
HEMATOCRIT: 29.2 % — AB (ref 36.0–46.0)
Hemoglobin: 9.2 g/dL — ABNORMAL LOW (ref 12.0–15.0)
MCH: 28 pg (ref 26.0–34.0)
MCHC: 31.5 g/dL (ref 30.0–36.0)
MCV: 88.8 fL (ref 78.0–100.0)
Platelets: 229 10*3/uL (ref 150–400)
RBC: 3.29 MIL/uL — ABNORMAL LOW (ref 3.87–5.11)
RDW: 14.1 % (ref 11.5–15.5)
WBC: 8.8 10*3/uL (ref 4.0–10.5)

## 2015-06-15 LAB — PROTIME-INR
INR: 1.08 (ref 0.00–1.49)
Prothrombin Time: 14.2 seconds (ref 11.6–15.2)

## 2015-06-15 MED ORDER — WARFARIN SODIUM 7.5 MG PO TABS
7.5000 mg | ORAL_TABLET | Freq: Once | ORAL | Status: AC
  Administered 2015-06-15: 7.5 mg via ORAL
  Filled 2015-06-15: qty 1

## 2015-06-15 MED ORDER — INSULIN GLARGINE 100 UNIT/ML ~~LOC~~ SOLN
10.0000 [IU] | Freq: Every day | SUBCUTANEOUS | Status: DC
  Administered 2015-06-15: 10 [IU] via SUBCUTANEOUS
  Filled 2015-06-15 (×2): qty 0.1

## 2015-06-15 MED ORDER — INSULIN ASPART 100 UNIT/ML ~~LOC~~ SOLN
0.0000 [IU] | Freq: Three times a day (TID) | SUBCUTANEOUS | Status: DC
  Administered 2015-06-15: 8 [IU] via SUBCUTANEOUS
  Administered 2015-06-15: 5 [IU] via SUBCUTANEOUS
  Administered 2015-06-16: 11 [IU] via SUBCUTANEOUS
  Administered 2015-06-16: 5 [IU] via SUBCUTANEOUS
  Administered 2015-06-16: 3 [IU] via SUBCUTANEOUS

## 2015-06-15 NOTE — Progress Notes (Signed)
ANTICOAGULATION CONSULT NOTE - Follow Up Consult  Pharmacy Consult for warfarin Indication: stroke  No Known Allergies  Patient Measurements:   Vital Signs: Temp: 98.3 F (36.8 C) (12/11 0509) Temp Source: Oral (12/11 0509) BP: 124/87 mmHg (12/11 0509) Pulse Rate: 85 (12/11 0509)  Labs:  Recent Labs  06/12/15 1522 06/13/15 0945 06/14/15 1320 06/15/15 0405  HGB 10.2*  --  9.3* 9.2*  HCT 32.7*  --  30.4* 29.2*  PLT 232  --  247 229  LABPROT 15.6* 14.7  --  14.2  INR 1.22 1.14  --  1.08  CREATININE 0.70  --   --  0.66    CrCl cannot be calculated (Unknown ideal weight.).  Assessment:   69 y/o F on warfarin PTA for stroke hx now resuming. Pt is awaiting SNF placement which will likely be on Monday. Warfarin restarted on 12/10. INR 1.14>1.08. S/p IM nail of the right hip. Hgb 9.2, plts 229.   Goal of Therapy:  INR 2-3 Monitor platelets by anticoagulation protocol: Yes   Plan:  Warfarin po 7.5mg  x 1 dose tonight  Restart home dose tomorrow  Daily INR; monitor for S&S of bleed  Angela Burke, PharmD Pharmacy Resident Pager: [phone] 06/15/2015,10:00 AM

## 2015-06-15 NOTE — Progress Notes (Signed)
Inpatient Diabetes Program Recommendations  AACE/ADA: New Consensus Statement on Inpatient Glycemic Control (2015)  Target Ranges:  Prepandial:   less than 140 mg/dL      Peak postprandial:   less than 180 mg/dL (1-2 hours)      Critically ill patients:  140 - 180 mg/dL  Results for CELECIA, HOHLT (MRN [id number]) as of 06/15/2015 10:53  Ref. Range 06/14/2015 06:39 06/14/2015 12:00 06/14/2015 16:54 06/14/2015 22:18 06/15/2015 06:31  Glucose-Capillary Latest Ref Range: 65-99 mg/dL 212 (H) 249 (H) 285 (H) 193 (H) 205 (H)  Results for JACOBI, SCORZA (MRN [id number]) as of 06/15/2015 10:53  Ref. Range 06/11/2015 01:38  Hemoglobin A1C Latest Ref Range: 4.8-5.6 % 7.3 (H)   Review of Glycemic Control  Diabetes history: DM2 Outpatient Diabetes medications: Lantus 10 units daily, Novolog 0-10 units TID with meals Current orders for Inpatient glycemic control: Lantus 5 units QHS, Novolog 0-15 units TID with meals  Inpatient Diabetes Program Recommendations: Insulin - Basal: Please consider increasing Lantus to 10 units QHS. Correction (SSI): Noted Novolog correction increased to moderate scale this morning. Please consider ordering Novolog bedtime correction scale. Diet: Added Carb Mod to Soft diet.  Thanks, Barnie Alderman, RN, MSN, CDE Diabetes Coordinator Inpatient Diabetes Program [phone] (Team Pager from [place] to [place]) [phone] ([place] office) [phone] [facility] office) [phone] [facility] office)

## 2015-06-15 NOTE — Progress Notes (Addendum)
Subjective:  Denies any chest pain or shortness of breath. Discussed with family have accepted [facility] for skilled nursing facility. Objective:  Vital Signs in the last 24 hours: Temp:  [98.3 F (36.8 C)-98.9 F (37.2 C)] 98.3 F (36.8 C) (12/11 0509) Pulse Rate:  [67-85] 85 (12/11 0509) Resp:  [15-18] 15 (12/11 0509) BP: (107-134)/(74-87) 124/87 mmHg (12/11 0509) SpO2:  [97 %-100 %] 97 % (12/11 0509)  Intake/Output from previous day: 12/10 0701 - 12/11 0700 In: 600 [P.O.:600] Out: 4 [Urine:4] Intake/Output from this shift: Total I/O In: -  Out: 1 [Urine:1]  Physical Exam: Neck: no adenopathy, no carotid bruit, no JVD and supple, symmetrical, trachea midline Lungs: clear to auscultation bilaterally Heart: regular rate and rhythm, S1, S2 normal and Soft systolic murmur noted Abdomen: soft, non-tender; bowel sounds normal; no masses,  no organomegaly Extremities: extremities normal, atraumatic, no cyanosis or edema and Right hip surgical dressing dry  Lab Results:  Recent Labs  06/14/15 1320 06/15/15 0405  WBC 9.7 8.8  HGB 9.3* 9.2*  PLT 247 229    Recent Labs  06/12/15 1522 06/15/15 0405  NA  --  136  K  --  3.8  CL  --  101  CO2  --  27  GLUCOSE  --  250*  BUN  --  6  CREATININE 0.70 0.66   No results for input(s): TROPONINI in the last 72 hours.  Invalid input(s): CK, MB Hepatic Function Panel  Recent Labs  06/15/15 0405  PROT 5.6*  ALBUMIN 2.7*  AST 24  ALT 23  ALKPHOS 58  BILITOT 0.7   No results for input(s): CHOL in the last 72 hours. No results for input(s): PROTIME in the last 72 hours.  Imaging: Imaging results have been reviewed and No results found.  Cardiac Studies:  Assessment/Plan:  Right intertrochanteric displaced femur Fracture status post fall status post intramedullary nail right hip postop day 3 doing well History of recent left CVA with right paresis  Mild coronary artery disease Hypertension Diabetes  mellitus Hyperlipidemia Compensated congestive heart failure secondary to preserved LV systolic function Patent foramen ovole History of tobacco abuse in the past Postop anemia\ Plan Continue present management Possible discharge to skilled nursing facility tomorrow Increase Lantus insulin to 10 units as per orders  LOS: 5 days    Charolette Forward 06/15/2015, 11:26 AM

## 2015-06-15 NOTE — Progress Notes (Signed)
Spoke with Pt's son, [name].  Mr. Shon Baton stated that the family would like for Pt to go to [facility].  Mr. Shon Baton asked that he be kept informed of d/c planning.  SW thanked Mr. Shon Baton for his time.  Bernita Raisin, [place] Social Work [phone]

## 2015-06-15 NOTE — Progress Notes (Signed)
Orthopaedic Trauma Service Progress Note  Subjective  Ortho issues stable   ROS As above   Objective   BP 124/87 mmHg  Pulse 85  Temp(Src) 98.3 F (36.8 C) (Oral)  Resp 15  SpO2 97%  Intake/Output      12/10 0701 - 12/11 0700 12/11 0701 - 12/12 0700   P.O. 600    I.V.     Total Intake 600     Urine 4 1   Total Output 4 1   Net +596 -1          Labs  CBG (last 3)   Recent Labs  06/14/15 1654 06/14/15 2218 06/15/15 0631  GLUCAP 285* 193* 205*    Results for [name], [name] (MRN [id number]) as of 06/15/2015 10:07  Ref. Range 06/15/2015 04:05  Sodium Latest Ref Range: 135-145 mmol/L 136  Potassium Latest Ref Range: 3.5-5.1 mmol/L 3.8  Chloride Latest Ref Range: 101-111 mmol/L 101  CO2 Latest Ref Range: 22-32 mmol/L 27  BUN Latest Ref Range: 6-20 mg/dL 6  Creatinine Latest Ref Range: 0.44-1.00 mg/dL 0.66  Calcium Latest Ref Range: 8.9-10.3 mg/dL 8.9  EGFR (Non-African Amer.) Latest Ref Range: >60 mL/min >60  EGFR (African American) Latest Ref Range: >60 mL/min >60  Glucose Latest Ref Range: 65-99 mg/dL 250 (H)  Anion gap Latest Ref Range: 5-15  8  Alkaline Phosphatase Latest Ref Range: 38-126 U/L 58  Albumin Latest Ref Range: 3.5-5.0 g/dL 2.7 (L)  AST Latest Ref Range: 15-41 U/L 24  ALT Latest Ref Range: 14-54 U/L 23  Total Protein Latest Ref Range: 6.5-8.1 g/dL 5.6 (L)  Total Bilirubin Latest Ref Range: 0.3-1.2 mg/dL 0.7  WBC Latest Ref Range: 4.0-10.5 K/uL 8.8  RBC Latest Ref Range: 3.87-5.11 MIL/uL 3.29 (L)  Hemoglobin Latest Ref Range: 12.0-15.0 g/dL 9.2 (L)  HCT Latest Ref Range: 36.0-46.0 % 29.2 (L)  MCV Latest Ref Range: 78.0-100.0 fL 88.8  MCH Latest Ref Range: 26.0-34.0 pg 28.0  MCHC Latest Ref Range: 30.0-36.0 g/dL 31.5  RDW Latest Ref Range: 11.5-15.5 % 14.1  Platelets Latest Ref Range: 150-400 K/uL 229  Prothrombin Time Latest Ref Range: 11.6-15.2 seconds 14.2  INR Latest Ref Range: 0.00-1.49  1.08    Exam  Gen: awake, family in  room, appears comfortable Ext:        Right Lower Extremity               Dressings c/d/i, spotty drainage              Ext warm             + DP pulse               Pt not moving toes or ankles- stroke related             Reports intact sensation distally     Assessment and Plan    POD/HD#: 16  69 y/o female s/p IMN R hip fracture   1. R hip fracture s/p IMN             WBAT               No ROM restrictions             Dressing changes as needed             PT/OT  2. Pain management:             Continue with low dose norco  Ultram 50 mg po q6h prn   3. ABL anemia/Hemodynamics             Mild ABL anemia- stable                4. Medical issues               HTN- per primary team              DM                         Pt only on SSI right now                         Sugars consistently >200                         lantus 10 mg sq at bedtime    Sugars continue to run high    Pt may need adjustment of lantus     Diabetes coordinator consult     Will change SSI to moderate scale     Only sugar reading less than 200 yesterday                          Continue with current SSI - closely mimics home regimen                         hgb a1c is 7.3%                          Blood sugars >200 will directly impact pts ability to heal and also increases risk of infection   5. DVT/PE prophylaxis:             Coumadin  6. ID:               periop abx completed  7. Metabolic Bone Disease:             vitamin D pending              Pt definition has osteoporosis given low energy hip fracture             tx options should be reviewed with PCP               Outpt DEXA will be needed in next 4-6 weeks  8. Activity:             Continue with therapy  9. FEN/GI prophylaxis/Foley/Lines:             Mechanical soft diet   10. Dispo:             Ortho issues stable             SNF when bed available    Jari Pigg, PA-C [facility] [phone] (O) 06/15/2015 10:07 AM

## 2015-06-16 DIAGNOSIS — K5901 Slow transit constipation: Secondary | ICD-10-CM | POA: Diagnosis not present

## 2015-06-16 DIAGNOSIS — E119 Type 2 diabetes mellitus without complications: Secondary | ICD-10-CM | POA: Diagnosis not present

## 2015-06-16 DIAGNOSIS — F17290 Nicotine dependence, other tobacco product, uncomplicated: Secondary | ICD-10-CM | POA: Diagnosis not present

## 2015-06-16 DIAGNOSIS — M25511 Pain in right shoulder: Secondary | ICD-10-CM | POA: Diagnosis not present

## 2015-06-16 DIAGNOSIS — M6281 Muscle weakness (generalized): Secondary | ICD-10-CM | POA: Diagnosis not present

## 2015-06-16 DIAGNOSIS — R269 Unspecified abnormalities of gait and mobility: Secondary | ICD-10-CM | POA: Diagnosis not present

## 2015-06-16 DIAGNOSIS — S72141D Displaced intertrochanteric fracture of right femur, subsequent encounter for closed fracture with routine healing: Secondary | ICD-10-CM | POA: Diagnosis not present

## 2015-06-16 DIAGNOSIS — I509 Heart failure, unspecified: Secondary | ICD-10-CM | POA: Diagnosis not present

## 2015-06-16 DIAGNOSIS — S728X2A Other fracture of left femur, initial encounter for closed fracture: Secondary | ICD-10-CM | POA: Diagnosis not present

## 2015-06-16 DIAGNOSIS — F411 Generalized anxiety disorder: Secondary | ICD-10-CM | POA: Diagnosis not present

## 2015-06-16 DIAGNOSIS — I5020 Unspecified systolic (congestive) heart failure: Secondary | ICD-10-CM | POA: Diagnosis not present

## 2015-06-16 DIAGNOSIS — F419 Anxiety disorder, unspecified: Secondary | ICD-10-CM | POA: Diagnosis not present

## 2015-06-16 DIAGNOSIS — S72144D Nondisplaced intertrochanteric fracture of right femur, subsequent encounter for closed fracture with routine healing: Secondary | ICD-10-CM | POA: Diagnosis not present

## 2015-06-16 DIAGNOSIS — I6350 Cerebral infarction due to unspecified occlusion or stenosis of unspecified cerebral artery: Secondary | ICD-10-CM | POA: Diagnosis not present

## 2015-06-16 DIAGNOSIS — I63032 Cerebral infarction due to thrombosis of left carotid artery: Secondary | ICD-10-CM | POA: Diagnosis not present

## 2015-06-16 DIAGNOSIS — S72141A Displaced intertrochanteric fracture of right femur, initial encounter for closed fracture: Secondary | ICD-10-CM | POA: Diagnosis not present

## 2015-06-16 DIAGNOSIS — Z8719 Personal history of other diseases of the digestive system: Secondary | ICD-10-CM | POA: Diagnosis not present

## 2015-06-16 DIAGNOSIS — M79651 Pain in right thigh: Secondary | ICD-10-CM | POA: Diagnosis not present

## 2015-06-16 DIAGNOSIS — I69151 Hemiplegia and hemiparesis following nontraumatic intracerebral hemorrhage affecting right dominant side: Secondary | ICD-10-CM | POA: Diagnosis not present

## 2015-06-16 DIAGNOSIS — E1159 Type 2 diabetes mellitus with other circulatory complications: Secondary | ICD-10-CM | POA: Diagnosis not present

## 2015-06-16 DIAGNOSIS — R1312 Dysphagia, oropharyngeal phase: Secondary | ICD-10-CM | POA: Diagnosis not present

## 2015-06-16 DIAGNOSIS — R2681 Unsteadiness on feet: Secondary | ICD-10-CM | POA: Diagnosis not present

## 2015-06-16 DIAGNOSIS — I25709 Atherosclerosis of coronary artery bypass graft(s), unspecified, with unspecified angina pectoris: Secondary | ICD-10-CM | POA: Diagnosis not present

## 2015-06-16 DIAGNOSIS — M25551 Pain in right hip: Secondary | ICD-10-CM | POA: Diagnosis not present

## 2015-06-16 DIAGNOSIS — E118 Type 2 diabetes mellitus with unspecified complications: Secondary | ICD-10-CM | POA: Diagnosis not present

## 2015-06-16 DIAGNOSIS — I2510 Atherosclerotic heart disease of native coronary artery without angina pectoris: Secondary | ICD-10-CM | POA: Diagnosis not present

## 2015-06-16 DIAGNOSIS — R1310 Dysphagia, unspecified: Secondary | ICD-10-CM | POA: Diagnosis not present

## 2015-06-16 DIAGNOSIS — D62 Acute posthemorrhagic anemia: Secondary | ICD-10-CM | POA: Diagnosis not present

## 2015-06-16 DIAGNOSIS — S72141S Displaced intertrochanteric fracture of right femur, sequela: Secondary | ICD-10-CM | POA: Diagnosis not present

## 2015-06-16 DIAGNOSIS — E785 Hyperlipidemia, unspecified: Secondary | ICD-10-CM | POA: Diagnosis not present

## 2015-06-16 DIAGNOSIS — I639 Cerebral infarction, unspecified: Secondary | ICD-10-CM | POA: Diagnosis not present

## 2015-06-16 DIAGNOSIS — E46 Unspecified protein-calorie malnutrition: Secondary | ICD-10-CM | POA: Diagnosis not present

## 2015-06-16 DIAGNOSIS — I10 Essential (primary) hypertension: Secondary | ICD-10-CM | POA: Diagnosis not present

## 2015-06-16 DIAGNOSIS — R489 Unspecified symbolic dysfunctions: Secondary | ICD-10-CM | POA: Diagnosis not present

## 2015-06-16 DIAGNOSIS — F329 Major depressive disorder, single episode, unspecified: Secondary | ICD-10-CM | POA: Diagnosis not present

## 2015-06-16 DIAGNOSIS — E559 Vitamin D deficiency, unspecified: Secondary | ICD-10-CM | POA: Diagnosis not present

## 2015-06-16 DIAGNOSIS — R5381 Other malaise: Secondary | ICD-10-CM | POA: Diagnosis not present

## 2015-06-16 DIAGNOSIS — Z4789 Encounter for other orthopedic aftercare: Secondary | ICD-10-CM | POA: Diagnosis not present

## 2015-06-16 DIAGNOSIS — R4701 Aphasia: Secondary | ICD-10-CM | POA: Diagnosis not present

## 2015-06-16 DIAGNOSIS — K625 Hemorrhage of anus and rectum: Secondary | ICD-10-CM | POA: Diagnosis not present

## 2015-06-16 LAB — PROTIME-INR
INR: 1.28 (ref 0.00–1.49)
PROTHROMBIN TIME: 16.1 s — AB (ref 11.6–15.2)

## 2015-06-16 LAB — GLUCOSE, CAPILLARY
GLUCOSE-CAPILLARY: 189 mg/dL — AB (ref 65–99)
GLUCOSE-CAPILLARY: 315 mg/dL — AB (ref 65–99)
Glucose-Capillary: 218 mg/dL — ABNORMAL HIGH (ref 65–99)

## 2015-06-16 LAB — VITAMIN D 25 HYDROXY (VIT D DEFICIENCY, FRACTURES): Vit D, 25-Hydroxy: 6.3 ng/mL — ABNORMAL LOW (ref 30.0–100.0)

## 2015-06-16 NOTE — Care Management Important Message (Signed)
Important Message  Patient Details  Name: [name] MRN: [id number] Date of Birth: 20-Apr-1946   Medicare Important Message Given:  Yes    [name] 06/16/2015, 3:23 PM

## 2015-06-16 NOTE — Progress Notes (Signed)
[name] discharged to SNF per MD order. All questions and concerns answered. Copy of instructions sent with patient.  IV removed. Report given to Lawrence, caregiver at [facility].  Patient escorted by [facility].    Tarri Abernethy R 06/16/2015 6:02 PM

## 2015-06-16 NOTE — Progress Notes (Signed)
Inpatient Diabetes Program Recommendations  AACE/ADA: New Consensus Statement on Inpatient Glycemic Control (2015)  Target Ranges:  Prepandial:   less than 140 mg/dL      Peak postprandial:   less than 180 mg/dL (1-2 hours)      Critically ill patients:  140 - 180 mg/dL    Results for JENNEY, VANDENBERGE (MRN [id number]) as of 06/16/2015 10:09  Ref. Range 06/15/2015 06:31 06/15/2015 11:52 06/15/2015 16:30 06/15/2015 17:28 06/15/2015 21:19  Glucose-Capillary Latest Ref Range: 65-99 mg/dL 205 (H) 283 (H) 201 (H) 217 (H) 194 (H)    Results for RAKEYA, ADEM (MRN [id number]) as of 06/16/2015 10:09  Ref. Range 06/16/2015 06:09  Glucose-Capillary Latest Ref Range: 65-99 mg/dL 189 (H)    Home DM Meds: Lantus 10 units daily       Humalog 0-10 units tid per SSI  Current Insulin Orders: Lantus 10 units QHS      Novolog Moderate SSI (0-15 units) TID AC     -Current A1c 7.3% indicates good control at home.  -Still having some glucose elevations.  -Eating 50-100% of meals.    MD- Please consider the following in-hospital insulin adjustments:  1. Increase Lantus to 12 units QHS  2. Start Novolog Meal Coverage- Novolog 3 units tid with meals [Use Glycemic Control Order set to order meal coverage]     --Will follow patient during hospitalization--  Wyn Quaker RN, MSN, CDE Diabetes Coordinator Inpatient Glycemic Control Team Team Pager: [phone] (8a-5p)

## 2015-06-16 NOTE — Progress Notes (Signed)
Physical Therapy Treatment Patient Details Name: ROWENA FRIENDS MRN: [id number] DOB: 1946/05/23 Today's Date: 06/16/2015    History of Present Illness 69 y.o female who was at [facility] for CVA rehab when she fell off bed and suffered R intertrochanteric fx and is s/p R IM nailing (06/12/15). PMH: CVA with R hemiparesis and aphasia (Oct 2016) and hospital stay in November with rectal ulcer with pt having hypotensive shock and  CPR having to be performed, CAD, DM    PT Comments    Pt labile throughout session. She received med an hour prior to session and would point to left shoulder but unable to state is painful or something wrong. RN made aware and HR 97 in sinus at time. Pt appears anxious with mobility and maybe contributing to labile state. Pt with increased mobility today able to perform sitting, standing and pivot with assist. Will continue to follow to maximize strength and function.   Follow Up Recommendations  SNF     Equipment Recommendations       Recommendations for Other Services       Precautions / Restrictions Precautions Precautions: Fall Precaution Comments: R Hemiparesis Restrictions Weight Bearing Restrictions: Yes RLE Weight Bearing: Weight bearing as tolerated    Mobility  Bed Mobility Overal bed mobility: Needs Assistance Bed Mobility: Rolling;Sidelying to Sit Rolling: Mod assist Sidelying to sit: Mod assist       General bed mobility comments: cues for sequence with use of rail to roll right, assist to bring legs off bed and elevate trunk  Transfers Overall transfer level: Needs assistance   Transfers: Sit to/from Stand Sit to Stand: Mod assist;+2 physical assistance Stand pivot transfers: Mod assist;+2 physical assistance       General transfer comment: Pt able to stand with cues, assist of belt for anterior translation and RLE blocked to prevent buckling. Assist to guide pelvis and hand placement with reaching for chair for  pivot  Ambulation/Gait                 Stairs            Wheelchair Mobility    Modified Rankin (Stroke Patients Only)       Balance Overall balance assessment: Needs assistance   Sitting balance-Leahy Scale: Fair       Standing balance-Leahy Scale: Poor                      Cognition Arousal/Alertness: Awake/alert Behavior During Therapy: Anxious Overall Cognitive Status: Difficult to assess                      Exercises General Exercises - Lower Extremity Ankle Circles/Pumps: AAROM;AROM;Right;Left;10 reps;Seated (AAROM on RLE) Quad Sets: AROM;Left;10 reps;Seated (unable to activate on RLE) Heel Slides: Left;5 reps;Seated;AROM    General Comments        Pertinent Vitals/Pain Pain Assessment: Faces Pain Score: 6  Pain Location: pt unable to state but guarding with RLE movement Pain Intervention(s): Limited activity within patient's tolerance;Premedicated before session;Repositioned;Monitored during session    Home Living                      Prior Function            PT Goals (current goals can now be found in the care plan section) Progress towards PT goals: Progressing toward goals    Frequency  Min 3X/week    PT Plan Current plan remains appropriate;Frequency  needs to be updated    Co-evaluation             End of Session Equipment Utilized During Treatment: Gait belt Activity Tolerance: Patient tolerated treatment well Patient left: in chair;with call bell/phone within reach;with chair alarm set     Time: 1002-1025 PT Time Calculation (min) (ACUTE ONLY): 23 min  Charges:  $Therapeutic Activity: 23-37 mins                    G Codes:      Melford Aase 06/17/15, 11:39 AM Elwyn Reach, [place]

## 2015-06-16 NOTE — Discharge Instructions (Signed)
Weight bearing as tolerated.  Keep dressing clean and dry till follow up.Hip Fracture A hip fracture is a fracture of the upper part of your thigh bone (femur).  CAUSES A hip fracture is caused by a direct blow to the side of your hip. This is usually the result of a fall but can occur in other circumstances, such as an automobile accident. RISK FACTORS There is an increased risk of hip fractures in people with:  An unsteady walking pattern (gait) and those with conditions that contribute to poor balance, such as Parkinson's disease or dementia.  Osteopenia and osteoporosis.  Cancer that spreads to the leg bones.  Certain metabolic diseases. SYMPTOMS  Symptoms of hip fracture include:  Pain over the injured hip.  Inability to put weight on the leg in which the fracture occurred (although, some patients are able to walk after a hip fracture).  Toes and foot of the affected leg point outward when you lie down. DIAGNOSIS A physical exam can determine if a hip fracture is likely to have occurred. X-ray exams are needed to confirm the fracture and to look for other injuries. The X-ray exam can help to determine the type of hip fracture. Rarely, the fracture is not visible on an X-ray image and a CT scan or MRI will have to be done. TREATMENT  The treatment for a fracture is usually surgery. This means using a screw, nail, or rod to hold the bones in place.  HOME CARE INSTRUCTIONS Take all medicines as directed by your health care provider. SEEK MEDICAL CARE IF: Pain continues, even after taking pain medicine. MAKE SURE YOU:  Understand these instructions.   Will watch your condition.  Will get help right away if you are not doing well or get worse.   This information is not intended to replace advice given to you by your health care provider. Make sure you discuss any questions you have with your health care provider.   Document Released: 06/21/2005 Document Revised: 06/26/2013  Document Reviewed: 01/31/2013 Elsevier Interactive Patient Education [employer].

## 2015-06-16 NOTE — Discharge Summary (Signed)
NAMEBRANDEE, HOPEWELL               ACCOUNT NO.:  [id number]  MEDICAL RECORD NO.:  [id number]  LOCATION:  [id number]                        FACILITY:  [facility]  PHYSICIAN:  Allegra Lai. Terrence Dupont, M.D. DATE OF BIRTH:  1945/07/18  DATE OF ADMISSION:  06/10/2015 DATE OF DISCHARGE:  06/16/2015                              DISCHARGE SUMMARY   ADMITTING DIAGNOSES: 1. Right intertrochanteric displaced femur fractures, status post     fall. 2. History of recent left cerebrovascular accident with right paresis. 3. Mild coronary artery disease. 4. Hypertension. 5. Diabetes mellitus. 6. Hyperlipidemia. 7. Compensated congestive heart failure, secondary to preserved left     ventricular systolic function. 8. Patent foramen ovale. 9. History of tobacco abuse in the past.  DISCHARGE DIAGNOSES: 1. Status post right intertrochanteric displaced femur fractures,     status post fall, status post intramedullary nail right hip, postop     day 4, doing well. 2. History of recent left cerebrovascular accident with right paresis. 3. Mild coronary artery disease. 4. Hypertension. 5. Diabetes mellitus. 6. Hyperlipidemia. 7. Compensated congestive heart failure secondary to preserved left     ventricular systolic function. 8. Patent foramen ovale. 9. History of tobacco abuse in the past. 10.Postoperative anemia.  DISCHARGE HOME MEDICATIONS: 1. Tylenol 325 mg every 6 hours as needed. 2. Lipitor 40 mg daily. 3. Ferrous sulfate 325 mg 3 times daily. 4. Humalog insulin sliding scale as before. 5. Metoprolol succinate 25 mg daily. 6. Micatin cream 1 application topically daily as needed for rash. 7. Nitrostat sublingual p.r.n. 8. Protonix 40 mg daily. 9. Tramadol 50 mg every 8 hours for moderate-to-severe pain. 10.Norco 1-2 tablets every 6 hours as needed. 11.Lantus insulin 5 units at bedtime. 12.MiraLAX 17 g as needed as before. 13.Warfarin 5 mg, take on Monday, Tuesday, Wednesday, Friday, Saturday     and  7.5 mg on Monday and Thursday as before.  The patient will have     PT/INR checked early next week by PMD.  Follow up with me in 2     weeks.  CONDITION AT DISCHARGE:  Stable.  The patient will be transferred to [facility].  BRIEF HISTORY AND HOSPITAL COURSE:  Ms. Gatch is a 69 year old female with past medical history significant for left anterior cerebral artery infarct in the setting of A2 occlusion, possible large vessel left from possible embolic event, moderate high-grade stenosis of right A2 segment, patent foramen ovale, history of mild coronary artery disease, hypertension, diabetes mellitus, history of congestive heart failure secondary to preserved LV systolic function, history of tobacco abuse, hyperlipidemia, history of recent lower GI bleed secondary to stercoral rectal surgery, recently discharged from the hospital, resident of [facility], came to ER by EMS as the patient had fall early Monday morning, she rolled out of the bed.  X-ray done today was noted to have right intertrochanteric displaced femur fracture.  Following CVA, the patient had right-sided hemiparesis with aphasia.  PHYSICAL EXAMINATION:  GENERAL:  She was awake in no acute distress. VITAL SIGNS:  Blood pressure was 152/92, pulse 62, temp was 99.1. HEENT:  Conjunctivae were pink. NECK:  Supple.  No JVD. LUNGS:  Clear to auscultation without  rhonchi or rales. CARDIOVASCULAR:  S1, S2 was normal.  There was soft systolic murmur.  No S3, gallop. ABDOMEN:  Soft.  Bowel sounds were present.  Nontender.  EXTREMITIES: She had shortening and external rotation of right lower extremities with good peripheral perfusion. NEURO:  She had right-sided paresis as before.  LABORATORY DATA:  Her hemoglobin was 10.2, hematocrit 32.7, white count of 11.3.  Her PT was 22.7, INR 2.01.  Blood sugar was 131.  Her last labs; sodium 136, potassium 3.8, BUN 6, creatinine 0.66, glucose was high at 250,  hemoglobin 9.2, hematocrit 29.2, white count of 8.8.  Her INR is slowly going up.  Today, PT is 16.1, INR 1.28.  BRIEF HOSPITAL COURSE:  The patient was admitted to orthopedic floor. The patient subsequently underwent right hip intramedullary nail without any problems.  The patient tolerated the procedure well.  There were no complications.  Postoperatively, OT/PT consultation was called.  The patient is up in the chair.  Her right hip surgical site appears to be dry with no evidence of hematoma.  The patient remained afebrile during the hospital stay.  The patient family requested for [facility].  The patient has bed offer [place] from there and will be transferred there.  The patient will be followed up by Orthopedics in next 10 days and in my office in 2 weeks.     Allegra Lai. Terrence Dupont, M.D.     MNH/MEDQ  D:  06/16/2015  T:  06/16/2015  Job:  [id number]

## 2015-06-16 NOTE — Clinical Social Work Note (Signed)
Clinical Social Worker has contacted medical records in reference to discharge dictation.   CSW remains available as needed.   Glendon Axe, MSW, LCSWA [phone] 06/16/2015 1:32 PM

## 2015-06-16 NOTE — Discharge Summary (Signed)
Priority discharge summary dictated on 06/16/2015 dictation number is [phone]

## 2015-06-16 NOTE — Clinical Social Work Placement (Signed)
   CLINICAL SOCIAL WORK PLACEMENT  NOTE  Date:  06/16/2015  Patient Details  Name: [name] MRN: [id number] Date of Birth: 1946-01-28  Clinical Social Work is seeking post-discharge placement for this patient at the [place] level of care (*CSW will initial, date and re-position this form in  chart as items are completed):  Yes   Patient/family provided with [place] Work Department's list of facilities offering this level of care within the geographic area requested by the patient (or if unable, by the patient's family).  Yes   Patient/family informed of their freedom to choose among providers that offer the needed level of care, that participate in Medicare, Medicaid or managed care program needed by the patient, have an available bed and are willing to accept the patient.  Yes   Patient/family informed of [facility]'s ownership interest in [facility] and [facility], as well as of the fact that they are under no obligation to receive care at these facilities.  PASRR submitted to EDS on       PASRR number received on       Existing PASRR number confirmed on 06/12/15     FL2 transmitted to all facilities in geographic area requested by pt/family on 06/12/15     FL2 transmitted to all facilities within larger geographic area on       Patient informed that his/her managed care company has contracts with or will negotiate with certain facilities, including the following:        Yes   Patient/family informed of bed offers received.  Patient chooses bed at  ([facility] )     Physician recommends and patient chooses bed at      Patient to be transferred to  ([facility] ) on 06/16/15.  Patient to be transferred to facility by  [facility]y [facility] )     Patient family notified on 06/16/15 of transfer.  Name of family member notified:   (Pt's son, [name] )     PHYSICIAN Please sign FL2      Additional Comment:    _______________________________________________ Rozell Searing, LCSW 06/16/2015, 12:22 PM

## 2015-06-17 ENCOUNTER — Non-Acute Institutional Stay (SKILLED_NURSING_FACILITY): Payer: Medicare Other | Admitting: Adult Health

## 2015-06-17 ENCOUNTER — Encounter: Payer: Self-pay | Admitting: Adult Health

## 2015-06-17 DIAGNOSIS — K5901 Slow transit constipation: Secondary | ICD-10-CM | POA: Diagnosis not present

## 2015-06-17 DIAGNOSIS — E118 Type 2 diabetes mellitus with unspecified complications: Secondary | ICD-10-CM

## 2015-06-17 DIAGNOSIS — Z8719 Personal history of other diseases of the digestive system: Secondary | ICD-10-CM | POA: Diagnosis not present

## 2015-06-17 DIAGNOSIS — I10 Essential (primary) hypertension: Secondary | ICD-10-CM

## 2015-06-17 DIAGNOSIS — D62 Acute posthemorrhagic anemia: Secondary | ICD-10-CM

## 2015-06-17 DIAGNOSIS — I639 Cerebral infarction, unspecified: Secondary | ICD-10-CM

## 2015-06-17 DIAGNOSIS — S72141S Displaced intertrochanteric fracture of right femur, sequela: Secondary | ICD-10-CM

## 2015-06-17 DIAGNOSIS — E785 Hyperlipidemia, unspecified: Secondary | ICD-10-CM | POA: Diagnosis not present

## 2015-06-17 DIAGNOSIS — I2510 Atherosclerotic heart disease of native coronary artery without angina pectoris: Secondary | ICD-10-CM | POA: Diagnosis not present

## 2015-06-18 ENCOUNTER — Non-Acute Institutional Stay (SKILLED_NURSING_FACILITY): Payer: Medicare Other | Admitting: Internal Medicine

## 2015-06-18 DIAGNOSIS — I639 Cerebral infarction, unspecified: Secondary | ICD-10-CM | POA: Diagnosis not present

## 2015-06-18 DIAGNOSIS — I25709 Atherosclerosis of coronary artery bypass graft(s), unspecified, with unspecified angina pectoris: Secondary | ICD-10-CM

## 2015-06-18 DIAGNOSIS — S72141S Displaced intertrochanteric fracture of right femur, sequela: Secondary | ICD-10-CM | POA: Diagnosis not present

## 2015-06-18 DIAGNOSIS — D62 Acute posthemorrhagic anemia: Secondary | ICD-10-CM

## 2015-06-18 DIAGNOSIS — R1310 Dysphagia, unspecified: Secondary | ICD-10-CM | POA: Diagnosis not present

## 2015-06-18 DIAGNOSIS — K219 Gastro-esophageal reflux disease without esophagitis: Secondary | ICD-10-CM

## 2015-06-18 DIAGNOSIS — R2681 Unsteadiness on feet: Secondary | ICD-10-CM

## 2015-06-18 DIAGNOSIS — R4701 Aphasia: Secondary | ICD-10-CM

## 2015-06-18 DIAGNOSIS — I10 Essential (primary) hypertension: Secondary | ICD-10-CM | POA: Diagnosis not present

## 2015-06-18 DIAGNOSIS — E46 Unspecified protein-calorie malnutrition: Secondary | ICD-10-CM | POA: Diagnosis not present

## 2015-06-18 DIAGNOSIS — F411 Generalized anxiety disorder: Secondary | ICD-10-CM | POA: Diagnosis not present

## 2015-06-18 DIAGNOSIS — E785 Hyperlipidemia, unspecified: Secondary | ICD-10-CM | POA: Diagnosis not present

## 2015-06-18 DIAGNOSIS — K5900 Constipation, unspecified: Secondary | ICD-10-CM

## 2015-06-18 DIAGNOSIS — R5381 Other malaise: Secondary | ICD-10-CM | POA: Diagnosis not present

## 2015-06-18 DIAGNOSIS — E1159 Type 2 diabetes mellitus with other circulatory complications: Secondary | ICD-10-CM

## 2015-06-18 LAB — CBC AND DIFFERENTIAL
HEMATOCRIT: 31 % — AB (ref 36–46)
HEMOGLOBIN: 9.5 g/dL — AB (ref 12.0–16.0)
PLATELETS: 337 10*3/uL (ref 150–399)
WBC: 8.9 10*3/mL

## 2015-06-18 LAB — BASIC METABOLIC PANEL
BUN: 12 mg/dL (ref 4–21)
Creatinine: 0.6 mg/dL (ref 0.5–1.1)
GLUCOSE: 172 mg/dL
Potassium: 3.6 mmol/L (ref 3.4–5.3)
Sodium: 140 mmol/L (ref 137–147)

## 2015-06-18 NOTE — Progress Notes (Signed)
Patient ID: [name], female   DOB: [birth year], 69 y.o.   MRN: [id number]     [facility]   PCP: Charolette Forward, MD  Code Status: full code  No Known Allergies  Chief Complaint  Patient presents with  . New Admit To SNF     HPI:  69 y.o. patient is here for short term rehabilitation post hospital admission from 06/10/15-06/16/15 post fall with right intertrochanteric displaced femoral fracture. She underwent intramedullary nailing. She has PMH of CVA with right sided hemiparesis, CAD, HTN, DM, HLD among others. She is seen in her room today. She has aphasia which limits her HPI and ROS. She also appears anxious during her exam and was anxious during participation with therapy as well. No falls reported in the facility. Patient is able to provide some yes and no answer and can follow simple command.   Review of Systems: limited Constitutional: Negative for fever, diaphoresis.  HENT: Negative for headache, congestion.   Respiratory: Negative for cough, shortness of breath Cardiovascular: Negative for chest pain Gastrointestinal: Negative for nausea, vomiting, abdominal pain. Genitourinary: Negative for dysuria.    Past Medical History  Diagnosis Date  . CHF (congestive heart failure) (HCC)     EF 99991111, grade 1 diastolic dysfunction per echo 04/2015  . Diabetes ([place]) 2016    Type II. On insulin  . Hypertension   . CVA (cerebral infarction) 04/2015    Started Plavix 04/2015  . Patent foramen ovale 04/2015    Started on warfarin 04/2015  . Enchondroma of bone 2011    left femur.   . Stercoral ulcer of rectum 05/16/2015   Past Surgical History  Procedure Laterality Date  . Cardiac surgery      Cath without stent  . Tee without cardioversion N/A 05/05/2015    Procedure: TRANSESOPHAGEAL ECHOCARDIOGRAM (TEE);  Surgeon: Dixie Dials, MD;  Location: [facility] ENDOSCOPY;  Service: Cardiovascular;  Laterality: N/A;  . Colonoscopy N/A 05/15/2015      Procedure: COLONOSCOPY;  Surgeon: Mauri Pole, MD;  Location: [facility] ENDOSCOPY;  Service: Endoscopy;  Laterality: N/A;  . Flexible sigmoidoscopy N/A 05/15/2015    Procedure: FLEXIBLE SIGMOIDOSCOPY;  Surgeon: Mauri Pole, MD;  Location: [facility] ENDOSCOPY;  Service: Endoscopy;  Laterality: N/A;  at bedside  . Intramedullary (im) nail intertrochanteric Right 06/12/2015    Procedure: INTRAMEDULLARY (IM) NAIL RIGHT HIP;  Surgeon: Renette Butters, MD;  Location: [facility];  Service: Orthopedics;  Laterality: Right;   Social History:   reports that she has been smoking Cigarettes.  She does not have any smokeless tobacco history on file. She reports that she does not drink alcohol or use illicit drugs.  Family History  Problem Relation Age of Onset  . Hypertension Mother   . Heart failure Mother   . Hyperlipidemia Mother     Medications:   Medication List       This list is accurate as of: 06/18/15  1:12 PM.  Always use your most recent med list.               acetaminophen 325 MG tablet  Commonly known as:  TYLENOL  Take 325 mg by mouth every 6 (six) hours as needed for mild pain.     atorvastatin 40 MG tablet  Commonly known as:  LIPITOR  Take 1 tablet (40 mg total) by mouth daily.     ferrous sulfate 325 (65 FE) MG tablet  Take 1 tablet (325 mg  total) by mouth 3 (three) times daily with meals.     HYDROcodone-acetaminophen 5-325 MG tablet  Commonly known as:  NORCO  Take 1-2 tablets by mouth every 6 (six) hours as needed for moderate pain.     insulin glargine 100 UNIT/ML injection  Commonly known as:  LANTUS  Inject 0.05 mLs (5 Units total) into the skin at bedtime.     insulin lispro 100 UNIT/ML injection  Commonly known as:  HUMALOG  Inject 0-10 Units into the skin 3 (three) times daily before meals. 0-150=0 151-200=2 201-250=3 251-300=4 301-350=6 351-400=8 401-402=10 & notify MD     metoprolol succinate 25 MG 24 hr tablet  Commonly known as:  TOPROL-XL  Take 1  tablet (25 mg total) by mouth daily.     miconazole 2 % cream  Commonly known as:  MICOTIN  Apply 1 application topically daily as needed (rash).     nitroGLYCERIN 0.4 MG SL tablet  Commonly known as:  NITROSTAT  Place 0.4 mg under the tongue every 5 (five) minutes as needed for chest pain.     pantoprazole 40 MG tablet  Commonly known as:  PROTONIX  Take 1 tablet (40 mg total) by mouth daily at 6 (six) AM.     polyethylene glycol packet  Commonly known as:  MIRALAX / GLYCOLAX  Take 17 g by mouth daily.     traMADol 50 MG tablet  Commonly known as:  ULTRAM  Take 50 mg by mouth every 8 (eight) hours as needed for moderate pain or severe pain.     warfarin 5 MG tablet  Commonly known as:  COUMADIN  Take 5-7.5 mg by mouth See admin instructions. Take 5 mg SUN,TUES,WED,FRI,SAT & 7.5 mg MON & THURS         Physical Exam: Filed Vitals:   06/18/15 1311  BP: 106/68  Pulse: 88  Temp: 99 F (37.2 C)  Resp: 18  SpO2: 98%    General- elderly female, thin built, in no acute distress Head- normocephalic, atraumatic Nose- normal nasal mucosa, no maxillary or frontal sinus tenderness, no nasal discharge Throat- moist mucus membrane  Eyes- no pallor, no icterus, no discharge, normal conjunctiva, normal sclera Neck- no cervical lymphadenopathy Cardiovascular- normal s1,s2, no murmurs, palpable dorsalis pedis and radial pulses, trace leg edema Respiratory- bilateral clear to auscultation, no wheeze, no rhonchi, no crackles, no use of accessory muscles Abdomen- bowel sounds present, soft, non tender Musculoskeletal- unsteady gait, limited right leg range of motion, right sided hemiparesis  Neurological- alert, has aphasia Skin- warm and dry Psychiatry- anxious   Labs reviewed: Basic Metabolic Panel:  Recent Labs  05/16/15 0445  06/10/15 1846 06/11/15 0138 06/12/15 1522 06/15/15 0405  NA 141  < > 141 140  --  136  K 3.5  < > 3.2* 3.4*  --  3.8  CL 112*  < > 109 107  --   101  CO2 24  < > 24 24  --  27  GLUCOSE 145*  < > 168* 147*  --  250*  BUN 8  < > 7 <5*  --  6  CREATININE 0.72  < > 0.65 0.60 0.70 0.66  CALCIUM 7.9*  < > 8.7* 8.8*  --  8.9  MG 1.7  --   --   --   --   --   PHOS 2.4*  --   --   --   --   --   < > = values  in this interval not displayed. Liver Function Tests:  Recent Labs  04/28/15 2000 05/13/15 1259 06/15/15 0405  AST 17 33 24  ALT 17 33 23  ALKPHOS 81 61 58  BILITOT 0.7 0.6 0.7  PROT 6.8 6.1* 5.6*  ALBUMIN 4.1 3.6 2.7*   No results for input(s): LIPASE, AMYLASE in the last 8760 hours. No results for input(s): AMMONIA in the last 8760 hours. CBC:  Recent Labs  04/28/15 1414  05/13/15 1259  06/10/15 1846  06/12/15 1522 06/14/15 1320 06/15/15 0405  WBC 9.4  < > 16.7*  < > 10.2  < > 11.3* 9.7 8.8  NEUTROABS 6.1  --  10.5*  --  6.9  --   --   --   --   HGB 13.4  < > 15.5*  < > 10.7*  < > 10.2* 9.3* 9.2*  HCT 41.0  < > 46.4*  < > 34.8*  < > 32.7* 30.4* 29.2*  MCV 85.2  < > 85.5  < > 90.4  < > 90.3 89.7 88.8  PLT 210  < > 281  < > 292  < > 232 247 229  < > = values in this interval not displayed. Cardiac Enzymes:  Recent Labs  05/15/15 1630  TROPONINI <0.03   BNP: Invalid input(s): POCBNP CBG:  Recent Labs  06/16/15 0609 06/16/15 1111 06/16/15 1626  GLUCAP 189* 218* 315*    Radiological Exams: Dg Chest 1 View  06/10/2015  CLINICAL DATA:  Fall, diabetes, hypertension, right hip intertrochanteric fracture EXAM: CHEST 1 VIEW COMPARISON:  05/16/2015 FINDINGS: Limited AP supine exam. This accounts for prominence of the mediastinum. Mild cardiomegaly with central vascular congestion. Low lung volumes evident. Trachea is midline. No focal pneumonia or CHF. No effusion or pneumothorax. Bones are osteopenic. Degenerative changes of the spine with a mild scoliosis. IMPRESSION: Stable low volume chest exam. Electronically Signed   By: Jerilynn Mages.  Shick M.D.   On: 06/10/2015 19:30   Ct Head Wo Contrast  06/10/2015  CLINICAL  DATA:  Rolled out of bed Monday morning. Femur fracture. Possible femur. Neck pain. EXAM: CT HEAD WITHOUT CONTRAST CT CERVICAL SPINE WITHOUT CONTRAST TECHNIQUE: Multidetector CT imaging of the head and cervical spine was performed following the standard protocol without intravenous contrast. Multiplanar CT image reconstructions of the cervical spine were also generated. COMPARISON:  Multiple exams, including 05/01/2015 FINDINGS: CT HEAD FINDINGS Left anterior cerebral artery distribution hypodensity tracking anterior and above the left lateral ventricle similar to distribution on prior MRI, compatible with encephalomalacia. Otherwise the cerebellum, brainstem, cerebral peduncles, thalami, and basal ganglia appear unremarkable. Basilar cisterns unremarkable. No intracranial hemorrhage, mass lesion, or acute CVA. 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 2.5 mm degenerative retrolisthesis at C3-4 with uncinate and facet spurring causing osseous foraminal stenosis at C3-4. The facet arthropathy on the left at C4-5 causes mild to moderate left foraminal stenosis. No cervical spine fracture or acute cervical spine findings. Incidental note is made of failure of fusion of the posterior arch of C1. There is moderate central narrowing of the thecal sac at C3-4 due to a central disc protrusion. Smaller disc protrusion centrally at C4-5 without impingement. IMPRESSION: 1. Evolutionary findings in the prior left anterior cerebral artery distribution infarct. No significant new/acute intracranial findings. 2. No acute cervical spine findings. Cervical spondylosis and degenerative disc disease contribute to impingement at C3-4 and C4-5 as detailed above. 3. 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  Failure of fusion of the posterior arch of C1. Electronically Signed   By: Van Clines M.D.   On: 06/10/2015 19:58   Ct Cervical Spine Wo Contrast  06/10/2015   CLINICAL DATA:  Rolled out of bed Monday morning. Femur fracture. Possible femur. Neck pain. EXAM: CT HEAD WITHOUT CONTRAST CT CERVICAL SPINE WITHOUT CONTRAST TECHNIQUE: Multidetector CT imaging of the head and cervical spine was performed following the standard protocol without intravenous contrast. Multiplanar CT image reconstructions of the cervical spine were also generated. COMPARISON:  Multiple exams, including 05/01/2015 FINDINGS: CT HEAD FINDINGS Left anterior cerebral artery distribution hypodensity tracking anterior and above the left lateral ventricle similar to distribution on prior MRI, compatible with encephalomalacia. Otherwise the cerebellum, brainstem, cerebral peduncles, thalami, and basal ganglia appear unremarkable. Basilar cisterns unremarkable. No intracranial hemorrhage, mass lesion, or acute CVA. 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 2.5 mm degenerative retrolisthesis at C3-4 with uncinate and facet spurring causing osseous foraminal stenosis at C3-4. The facet arthropathy on the left at C4-5 causes mild to moderate left foraminal stenosis. No cervical spine fracture or acute cervical spine findings. Incidental note is made of failure of fusion of the posterior arch of C1. There is moderate central narrowing of the thecal sac at C3-4 due to a central disc protrusion. Smaller disc protrusion centrally at C4-5 without impingement. IMPRESSION: 1. Evolutionary findings in the prior left anterior cerebral artery distribution infarct. No significant new/acute intracranial findings. 2. No acute cervical spine findings. Cervical spondylosis and degenerative disc disease contribute to impingement at C3-4 and C4-5 as detailed above. 3. 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 Failure of fusion of the posterior arch of C1. Electronically Signed   By: Van Clines M.D.   On: 06/10/2015 19:58   Dg Femur, Min 2 Views  Right  06/10/2015  CLINICAL DATA:  Fall with neck pain.  Initial encounter. EXAM: RIGHT FEMUR 2 VIEWS COMPARISON:  None. FINDINGS: Intertrochanteric right femur fracture with varus angulation and rotation. The right hip is located. Marked osteopenia. There is loosely organized medullary arc and ring calcification at the level of the distal femur compatible with enchondroma. No aggressive features noted. Atherosclerosis. IMPRESSION: Displaced intertrochanteric right femur fracture. Electronically Signed   By: Monte Fantasia M.D.   On: 06/10/2015 19:29     Assessment/Plan  Physical deconditioning Will have her work with physical therapy and occupational therapy team to help with gait training and muscle strengthening exercises.fall precautions. Skin care. Encourage to be out of bed.   Unsteady gait With right sided hemiparesis and right femoral fracture. Will have patient work with PT/OT as tolerated to regain strength and restore function.  Fall precautions are in place.  Right intertrochanteric displaced femoral fracture S/p IM nailing. To work with PT and OT. Fall precautions. Currently on tramadol 50 mg q8h prn with tylenol 325 mg q6h prn pain and norco 5-325 mg 1-2 tab q6h prn pain. Pain not controlled at present. Will have her on extra strength tylenol 500 mg 2 tab bid with tramadol 50 mg 1-2 tab q8h prn for pain and discontinue norco for with her being a high fall risk. Continue coumadin for anticoagulation with goal inr 2-3  Blood loss anemia Post surgery. Continue ferrous sulfate 325 mg tid. Monitor h&h  Protein calorie malnutrition Will get dietary consult. Will need full assistance with meals for now. Monitor weekly weight  Anxiety Appears situational and her stroke could be contributing some.  Get psychiatry consult and start xanax 0.25 mg daily in am to help with her nerves during therapy for now and reassess  Aphasia To work with SLP team  Dysphagia Aspiration precautions.  Assistance with feeding. To work with SLP team  HLD Continue lipitor 40 mg daily  HTN Stable, infact BP on lower side. Currently on metoprolol succinate 25 mg daily. Monitor BP. Will have holding parameters for metoprolol  CAD remians chest pain free. Continue metoprolol 25 mg daily, prn NTG and statin.  gerd Continue protonix 40 mg daily  Constipation Continue miralax 17 g daily as needed  DM  Monitor cbg, continue lantus 5 u qhs and SSI humalog. Continue statin Lab Results  Component Value Date   HGBA1C 7.3* 06/11/2015     Goals of care: short term rehabilitation   Labs/tests ordered: cbc, cmp  Family/ staff Communication: reviewed care plan with patient and nursing supervisor    Blanchie Serve, MD  [facility] [phone] (Monday-Friday 8 am - 5 pm) [phone] (afterhours)

## 2015-06-25 DIAGNOSIS — S72144D Nondisplaced intertrochanteric fracture of right femur, subsequent encounter for closed fracture with routine healing: Secondary | ICD-10-CM | POA: Diagnosis not present

## 2015-07-21 ENCOUNTER — Non-Acute Institutional Stay (SKILLED_NURSING_FACILITY): Payer: Medicare Other | Admitting: Adult Health

## 2015-07-21 DIAGNOSIS — E1159 Type 2 diabetes mellitus with other circulatory complications: Secondary | ICD-10-CM | POA: Diagnosis not present

## 2015-07-21 DIAGNOSIS — F419 Anxiety disorder, unspecified: Secondary | ICD-10-CM | POA: Diagnosis not present

## 2015-07-21 DIAGNOSIS — K5901 Slow transit constipation: Secondary | ICD-10-CM

## 2015-07-21 DIAGNOSIS — E785 Hyperlipidemia, unspecified: Secondary | ICD-10-CM

## 2015-07-21 DIAGNOSIS — F32A Depression, unspecified: Secondary | ICD-10-CM

## 2015-07-21 DIAGNOSIS — I2510 Atherosclerotic heart disease of native coronary artery without angina pectoris: Secondary | ICD-10-CM

## 2015-07-21 DIAGNOSIS — D62 Acute posthemorrhagic anemia: Secondary | ICD-10-CM | POA: Diagnosis not present

## 2015-07-21 DIAGNOSIS — E46 Unspecified protein-calorie malnutrition: Secondary | ICD-10-CM

## 2015-07-21 DIAGNOSIS — I10 Essential (primary) hypertension: Secondary | ICD-10-CM | POA: Diagnosis not present

## 2015-07-21 DIAGNOSIS — Z8719 Personal history of other diseases of the digestive system: Secondary | ICD-10-CM | POA: Diagnosis not present

## 2015-07-21 DIAGNOSIS — S72141S Displaced intertrochanteric fracture of right femur, sequela: Secondary | ICD-10-CM | POA: Diagnosis not present

## 2015-07-21 DIAGNOSIS — F329 Major depressive disorder, single episode, unspecified: Secondary | ICD-10-CM | POA: Diagnosis not present

## 2015-07-21 DIAGNOSIS — I639 Cerebral infarction, unspecified: Secondary | ICD-10-CM | POA: Diagnosis not present

## 2015-07-23 DIAGNOSIS — S72144D Nondisplaced intertrochanteric fracture of right femur, subsequent encounter for closed fracture with routine healing: Secondary | ICD-10-CM | POA: Diagnosis not present

## 2015-07-24 ENCOUNTER — Encounter: Payer: Self-pay | Admitting: Neurology

## 2015-07-24 ENCOUNTER — Ambulatory Visit (INDEPENDENT_AMBULATORY_CARE_PROVIDER_SITE_OTHER): Payer: Medicare Other | Admitting: Neurology

## 2015-07-24 VITALS — BP 123/88 | HR 70

## 2015-07-24 DIAGNOSIS — I63032 Cerebral infarction due to thrombosis of left carotid artery: Secondary | ICD-10-CM | POA: Diagnosis not present

## 2015-07-24 DIAGNOSIS — E559 Vitamin D deficiency, unspecified: Secondary | ICD-10-CM

## 2015-07-24 DIAGNOSIS — M25511 Pain in right shoulder: Secondary | ICD-10-CM

## 2015-07-24 DIAGNOSIS — R269 Unspecified abnormalities of gait and mobility: Secondary | ICD-10-CM

## 2015-07-24 NOTE — Progress Notes (Signed)
PATIENT: [name] DOB: [birth year]  Chief Complaint  Patient presents with  . Seizures    She is here with her son, [name].  She had a stroke on 04/28/15.  She is currently at [facility] but will be discharged to her son's care in one week.  She is getting PT,OT and speech therapy.  She is still having significant right-sided weakness and difficulty speaking.  She also had a fall in December and fractured her right hip.     HISTORICAL  TIPHANY MCEACHERN distant years old right-handed female, follow-up her most recent stroke in April 28 2015  She had a history of hypertension, diabetes, hyperlipidemia, not on any anticoagulation treatment prior to hospital admission.  In April 27 2016, she presenting with acute onset slurred speech, right leg and arm weakness, I personally reviewed MRI of the brain showed evidence of left ACA infarction, with left A2 occlusion,  She was found to have patent foraminal valley by TEE in October 2016, was put on Coumadin as stroke prevention  She also suffered right hip fracture require surgery in June 12 2015, she is now at [facility], will be back home with her son in next few days  She also complains of few days' history of severe right shoulder pain, right shoulder tightness, limited range of motion. I reviewed laboratory, LDL 121, A1c 11 point 6,  Repeat Lab in 12 2016, Hg 9.5, A1c 7.0.  REVIEW OF SYSTEMS: Full 14 system review of systems performed and notable only for headache, weakness, slurred speech, tremor, anxiety, cramps, aching muscles, incontinence.  ALLERGIES: No Known Allergies  HOME MEDICATIONS: Current Outpatient Prescriptions  Medication Sig Dispense Refill  . acetaminophen (TYLENOL) 500 MG tablet Take 500 mg by mouth 2 (two) times daily.    Marland Kitchen ALPRAZolam (XANAX) 0.25 MG tablet Take one tablet by mouth daily as needed for anxiety    . atorvastatin (LIPITOR) 40 MG tablet Take 1 tablet (40 mg total) by mouth daily. 30  tablet 3  . escitalopram (LEXAPRO) 10 MG tablet Take 1 tablet by mouth daily for anxiety    . ferrous sulfate 325 (65 FE) MG tablet Take 1 tablet (325 mg total) by mouth 3 (three) times daily with meals. 90 tablet 3  . hydrocerin (EUCERIN) CREA Apply 1 application topically daily.    . insulin glargine (LANTUS) 100 UNIT/ML injection Inject 0.05 mLs (5 Units total) into the skin at bedtime. (Patient taking differently: Inject 10 Units into the skin daily. ) 10 mL 11  . insulin lispro (HUMALOG) 100 UNIT/ML injection Inject 0-10 Units into the skin 3 (three) times daily before meals. 0-150=0 151-200=2 201-250=5 251-300=8 301-350=11 351-400=15 >401 call MD    . metoprolol succinate (TOPROL-XL) 25 MG 24 hr tablet Take 1 tablet (25 mg total) by mouth daily. 30 tablet 3  . miconazole (MICOTIN) 2 % cream Apply 1 application topically daily as needed (rash).     . nitroGLYCERIN (NITROSTAT) 0.4 MG SL tablet Place 0.4 mg under the tongue every 5 (five) minutes as needed for chest pain.    . pantoprazole (PROTONIX) 40 MG tablet Take 1 tablet (40 mg total) by mouth daily at 6 (six) AM. 30 tablet 3  . polyethylene glycol (MIRALAX / GLYCOLAX) packet Take 17 g by mouth daily. 14 each 1  . traMADol (ULTRAM) 50 MG tablet Take 1 tablet by mouth every 8 hours as needed for pain 2-5 on a scale 1-10. Take 2 tablets by  mouth every 8 hours as needed for pain 6-10 on a scale 1-10    . warfarin (COUMADIN) 5 MG tablet Take 5-7.5 mg by mouth See admin instructions. Take 5 mg SUN,TUES,WED,FRI,SAT & 7.5 mg MON & THURS     No current facility-administered medications for this visit.    PAST MEDICAL HISTORY: Past Medical History  Diagnosis Date  . CHF (congestive heart failure) (HCC)     EF 99991111, grade 1 diastolic dysfunction per echo 04/2015  . Diabetes ([place]) 2016    Type II. On insulin  . Hypertension   . CVA (cerebral infarction) 04/2015    Started Plavix 04/2015  . Patent foramen ovale 04/2015    Started on  warfarin 04/2015  . Enchondroma of bone 2011    left femur.   . Stercoral ulcer of rectum 05/16/2015    PAST SURGICAL HISTORY: Past Surgical History  Procedure Laterality Date  . Cardiac surgery      Cath without stent  . Tee without cardioversion N/A 05/05/2015    Procedure: TRANSESOPHAGEAL ECHOCARDIOGRAM (TEE);  Surgeon: Dixie Dials, MD;  Location: [facility] ENDOSCOPY;  Service: Cardiovascular;  Laterality: N/A;  . Colonoscopy N/A 05/15/2015    Procedure: COLONOSCOPY;  Surgeon: Mauri Pole, MD;  Location: [place] ENDOSCOPY;  Service: Endoscopy;  Laterality: N/A;  . Flexible sigmoidoscopy N/A 05/15/2015    Procedure: FLEXIBLE SIGMOIDOSCOPY;  Surgeon: Mauri Pole, MD;  Location: [place] ENDOSCOPY;  Service: Endoscopy;  Laterality: N/A;  at bedside  . Intramedullary (im) nail intertrochanteric Right 06/12/2015    Procedure: INTRAMEDULLARY (IM) NAIL RIGHT HIP;  Surgeon: Renette Butters, MD;  Location: [place];  Service: Orthopedics;  Laterality: Right;    FAMILY HISTORY: Family History  Problem Relation Age of Onset  . Hypertension Mother   . Heart failure Mother   . Hyperlipidemia Mother   . Heart attack Father     SOCIAL HISTORY:  Social History   Social History  . Marital Status: Married    Spouse Name: N/A  . Number of Children: 6  . Years of Education: 12   Occupational History  . Retired    Social History Main Topics  . Smoking status: Current Every Day Smoker    Types: Cigarettes  . Smokeless tobacco: Not on file     Comment: Quit 04/28/15  . Alcohol Use: No  . Drug Use: No  . Sexual Activity: Not on file   Other Topics Concern  . Not on file   Social History Narrative   She will be living with her two sons.\   Right-handed.   No caffeine use.     PHYSICAL EXAM   Filed Vitals:   07/24/15 1538  BP: 123/88  Pulse: 70    Not recorded      There is no weight on file to calculate BMI.  PHYSICAL EXAMNIATION:  Gen: NAD, conversant, well nourised,  obese, well groomed                     Cardiovascular: Regular rate rhythm, no peripheral edema, warm, nontender. Eyes: Conjunctivae clear without exudates or hemorrhage Neck: Supple, no carotid bruise. Pulmonary: Clear to auscultation bilaterally   NEUROLOGICAL EXAM:  MENTAL STATUS: Speech:    Speech is normal; fluent and spontaneous with normal comprehension.  Cognition:     Orientation to time, place and person     Normal recent and remote memory     Normal Attention span and concentration  Normal Language, naming, repeating,spontaneous speech     Fund of knowledge   CRANIAL NERVES: CN II: Right visual field deficit, Pupils are round equal and briskly reactive to light. CN III, IV, VI: extraocular movement are normal. No ptosis. CN V: Facial sensation is intact to pinprick in all 3 divisions bilaterally. Corneal responses are intact.  CN VII: Face is symmetric with normal eye closure and smile. CN VIII: Hearing is normal to rubbing fingers CN IX, X: Palate elevates symmetrically. Phonation is normal. CN XI: Head turning and shoulder shrug are intact CN XII: Tongue is midline with normal movements and no atrophy.  MOTOR: She only has trace movement of right lower extremity, complains of significant right shoulder pain, limited range of motion of right shoulder, proximal and distal strength of right upper extremity was 4  REFLEXES: Hyperreflexia on the right arm, right leg, plantar responses are flexor bilaterally  SENSORY: Intact to light touch, pinprick and vibratory sensation  COORDINATION: Rapid alternating movements and fine finger movements are intact. There is no dysmetria on finger-to-nose and heel-knee-shin.    GAIT/STANCE: Deferred   DIAGNOSTIC DATA (LABS, IMAGING, TESTING) - I reviewed patient records, labs, notes, testing and imaging myself where available.   ASSESSMENT AND PLAN  SENIYAH SCHRUPP is a 70 y.o. female   Left ACA stroke, left A2  occlusion with residual spastic right hemiparesis,  She has evidence of patent foraminal ovale is taking Coumadin  She will be discharged home soon, continue PT/ OT /speech therapy Right shoulder pain  X-ray of right shoulder  Vitamin D deficiency  Continue vitamin D supplement   Marcial Pacas, M.D. Ph.D.  [facility] [address] [place], [place] [address] Ph: [phone] Fax: [phone]  CC: Referring Provider

## 2015-07-25 ENCOUNTER — Ambulatory Visit
Admission: RE | Admit: 2015-07-25 | Discharge: 2015-07-25 | Disposition: A | Discharge status: Home / Self Care | Payer: BC Managed Care – PPO | Source: Ambulatory Visit | Attending: Neurology | Admitting: Neurology

## 2015-07-25 DIAGNOSIS — R269 Unspecified abnormalities of gait and mobility: Secondary | ICD-10-CM

## 2015-07-25 DIAGNOSIS — M25511 Pain in right shoulder: Secondary | ICD-10-CM

## 2015-07-25 DIAGNOSIS — I63032 Cerebral infarction due to thrombosis of left carotid artery: Secondary | ICD-10-CM

## 2015-07-25 DIAGNOSIS — E559 Vitamin D deficiency, unspecified: Secondary | ICD-10-CM

## 2015-07-27 ENCOUNTER — Encounter: Payer: Self-pay | Admitting: Adult Health

## 2015-07-27 NOTE — Progress Notes (Addendum)
Patient ID: [name], female   DOB: Oct 11, 1945, 70 y.o.   MRN: [id number]    DATE:  06/17/15  MRN:  [id number]  BIRTHDAY: 1946-03-21  Facility:  Nursing Home Location:  [place] Room Number: [id number]  LEVEL OF CARE:  SNF (31)  Contact Information    Name Relation Home Work [place] Son [phone]     Glendon Axe   [phone]       Code Status History    Date Active Date Inactive Code Status Order ID Comments User Context   06/12/2015  2:59 PM 06/16/2015  9:04 PM Full Code [id number]  Lovett Calender, PA-C Inpatient   06/11/2015 12:29 AM 06/12/2015  2:59 PM Full Code [id number]  Charolette Forward, MD Inpatient   05/13/2015  4:59 PM 05/21/2015  8:16 PM Full Code [id number]  Radene Gunning, NP Inpatient   04/28/2015  6:22 PM 05/07/2015  1:02 AM Full Code [id number]  Charolette Forward, MD Inpatient   11/12/2012  5:26 PM 11/13/2012  6:01 PM Full Code [id number]  Birdie Riddle, MD ED      Chief Complaint  Patient presents with  . Hospitalization Follow-up    Right femur fracture  S/P right hip intramedullary nail, CVA, CAD, hypertension, DM, hyperlipidemia, anemia, GI bleed history and constipation    HISTORY OF PRESENT ILLNESS:  This is a 70 year old female who was been admitted to [facility] on 06/16/15 from [facility]. 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 She was recently discharged from the hospital. She had a fall (rolled out of her bed) and sustained a right intertrochanteric displaced femur fracture. She had right hip intramedullary nail on 06/12/15. She has been admitted for a short-term rehabilitation.   PAST MEDICAL HISTORY:  Past Medical History  Diagnosis Date  . CHF (congestive heart failure) (HCC)     EF 50-55%, grade 1  diastolic dysfunction per echo 04/2015  . Diabetes ([place]) 2016    Type II. On insulin  . Hypertension   . CVA (cerebral infarction) 04/2015    Started Plavix 04/2015  . Patent foramen ovale 04/2015    Started on warfarin 04/2015  . Enchondroma of bone 2011    left femur.   . Stercoral ulcer of rectum 05/16/2015     CURRENT MEDICATIONS: Reviewed  Patient's Medications  New Prescriptions   No medications on file  Previous Medications           ATORVASTATIN (LIPITOR) 40 MG TABLET    Take 1 tablet (40 mg total) by mouth daily.   CHOLECALCIFEROL (VITAMIN D) 1000 UNITS TABLET    Take 1,000 Units by mouth daily.       FERROUS SULFATE 325 (65 FE) MG TABLET    Take 1 tablet (325 mg total) by mouth 3 (three) times daily with meals.   HYDROCERIN (EUCERIN) CREA    Apply 1 application topically daily.   INSULIN GLARGINE (LANTUS) 100 UNIT/ML INJECTION    Inject 0.05 mLs (5 Units total) into the skin at bedtime.   INSULIN LISPRO (HUMALOG) 100 UNIT/ML INJECTION    Inject 0-10 Units into the skin 3 (three) times daily before meals. 0-150=0 151-200=2 201-250=5 251-300=8 301-350=11 351-400=15 >401 call MD   METOPROLOL SUCCINATE (TOPROL-XL) 25 MG 24 HR TABLET  Take 1 tablet (25 mg total) by mouth daily.   MICONAZOLE (MICOTIN) 2 % CREAM    Apply 1 application topically daily as needed (rash).    NITROGLYCERIN (NITROSTAT) 0.4 MG SL TABLET    Place 0.4 mg under the tongue every 5 (five) minutes as needed for chest pain.   PANTOPRAZOLE (PROTONIX) 40 MG TABLET    Take 1 tablet (40 mg total) by mouth daily at 6 (six) AM.   POLYETHYLENE GLYCOL (MIRALAX / GLYCOLAX) PACKET    Take 17 g by mouth daily. As needed   TRAMADOL (ULTRAM) 50 MG TABLET    Take 1 tablet by mouth every 8 hours as needed for pain 2-5 on a scale 1-10. Take 2 tablets by mouth every 8 hours as needed for pain 6-10 on a scale 1-10   WARFARIN (COUMADIN) 5 MG TABLET    Take 5-7.5 mg by mouth See admin instructions. Take 5 mg  SUN,TUES,WED,FRI,SAT & 7.5 mg MON & THURS              HYDROCODONE-ACETAMINOPHEN (NORCO) 5-325 MG TABLET    Take 1-2 tablets by mouth every 6 (six) hours as needed for moderate pain.     No Known Allergies   REVIEW OF SYSTEMS:  Unable to obtain due to aphasia  PHYSICAL EXAMINATION  GENERAL APPEARANCE: Well nourished. In no acute distress. Normal body habitus SKIN:  Right hip surgical incision is covered with dry dressing, no erythema HEAD: Normal in size and contour. No evidence of trauma EYES: Lids open and close normally. No blepharitis, entropion or ectropion. PERRL. Conjunctivae are clear and sclerae are white. Lenses are without opacity EARS: Pinnae are normal. Patient hears normal voice tunes of the examiner MOUTH and THROAT: Lips are without lesions. Oral mucosa is moist and without lesions. Tongue is normal in shape, size, and color and without lesions NECK: supple, trachea midline, no neck masses, no thyroid tenderness, no thyromegaly LYMPHATICS: no LAN in the neck, no supraclavicular LAN RESPIRATORY: breathing is even & unlabored, BS CTAB CARDIAC: RRR, ,no extra heart sounds, no edema GI: abdomen soft, normal BS, no masses, no tenderness, no hepatomegaly, no splenomegaly EXTREMITIES:  Right hemiparesis (upper and lower extremities) NEURO:  aphasic PSYCHIATRIC: Alert and oriented to self. Affect and behavior are appropriate  LABS/RADIOLOGY: Labs reviewed: Basic Metabolic Panel:  Recent Labs  05/16/15 0445  06/10/15 1846 06/11/15 0138 06/12/15 1522 06/15/15 0405   NA 141  < > 141 140  --  136   K 3.5  < > 3.2* 3.4*  --  3.8   CL 112*  < > 109 107  --  101   CO2 24  < > 24 24  --  27   GLUCOSE 145*  < > 168* 147*  --  250*   BUN 8  < > 7 <5*  --  6   CREATININE 0.72  < > 0.65 0.60 0.70 0.66   CALCIUM 7.9*  < > 8.7* 8.8*  --  8.9   MG 1.7  --   --   --   --   --    PHOS 2.4*  --   --   --   --   --    < > = values in this interval not displayed. Liver  Function Tests:  Recent Labs  04/28/15 2000 05/13/15 1259 06/15/15 0405  AST 17 33 24  ALT 17 33 23  ALKPHOS 81 61 58  BILITOT 0.7 0.6 0.7  PROT 6.8 6.1* 5.6*  ALBUMIN 4.1 3.6 2.7*   CBC:  Recent Labs  04/28/15 1414  05/13/15 1259  06/10/15 1846  06/12/15 1522 06/14/15 1320 06/15/15 0405   WBC 9.4  < > 16.7*  < > 10.2  < > 11.3* 9.7 8.8   NEUTROABS 6.1  --  10.5*  --  6.9  --   --   --   --    HGB 13.4  < > 15.5*  < > 10.7*  < > 10.2* 9.3* 9.2*   HCT 41.0  < > 46.4*  < > 34.8*  < > 32.7* 30.4* 29.2*   MCV 85.2  < > 85.5  < > 90.4  < > 90.3 89.7 88.8   PLT 210  < > 281  < > 292  < > 232 247 229   < > = values in this interval not displayed.  Lipid Panel:  Recent Labs  02/07/15 0845 04/29/15 0439  HDL 36* 44   Cardiac Enzymes:  Recent Labs  05/15/15 1630  TROPONINI <0.03   CBG:  Recent Labs  06/16/15 0609 06/16/15 1111 06/16/15 1626  GLUCAP 189* 218* 315*    Dg Shoulder Right  07/25/2015  CLINICAL DATA:  Fall in December 2016. Superior anterior and posterior shoulder pain. EXAM: RIGHT SHOULDER - 2+ VIEW COMPARISON:  None. FINDINGS: Mild degenerative changes in the glenohumeral joint with spurring. Loss of subacromial space, likely related to chronic rotator cuff disease. No acute bony abnormality. Specifically, no fracture, subluxation, or dislocation. Soft tissues are intact. IMPRESSION: No acute bony abnormality. Electronically Signed   By: Rolm Baptise M.D.   On: 07/25/2015 16:55    ASSESSMENT/PLAN:  Right intratrochanteric displaced femur fracture S/P right hip intramedullary nail - for rehabilitation; continue tramadol 50 mg 1 tab by mouth every 8 hours when necessary and Norco 5/325 mg 1-2 tabs by mouth every 6 hours when necessary for pain; Coumadin for DVT prophylaxis; follow-up with Dr. Terrence Dupont, orthopedic surgeon, in 2 weeks  CVA - continue Coumadin and rehabilitation  CAD - continue NTG when necessary   Hypertension - well controlled;  continue metoprolol succinate 25 mg daily  Diabetes mellitus, type II - hemoglobin A1c 7.3; continue Lantus 5 units subcutaneous daily at bedtime and Humalog sliding scale subcutaneous 3 times a day before meals  Hyperlipidemia - continue Lipitor 40 mg 1 tab by mouth daily  Anemia, acute blood loss - hemoglobin 9.2; continue ferrous sulfate 325 mg 1 tab 3 times a day  History of GI bleed - continue Protonix 40 mg daily  Constipation - continue MiraLAX 17 g by mouth daily when necessary     Goals of care:  Short-term rehabilitation    [facility], NP [facility] [phone]

## 2015-07-27 NOTE — Progress Notes (Signed)
Patient ID: [name], female   DOB: September 02, 1945, 70 y.o.   MRN: [id number]    DATE:  07/21/15  MRN:  [id number]  BIRTHDAY: 1945-08-08  Facility:  Nursing Home Location:  [place] and [place] Room Number: [id number]  LEVEL OF CARE:  SNF (31)  Contact Information    Name Relation Home Work [place] Son [phone]     Glendon Axe   [phone]       Code Status History    Date Active Date Inactive Code Status Order ID Comments User Context   06/12/2015  2:59 PM 06/16/2015  9:04 PM Full Code [id number]  Lovett Calender, PA-C Inpatient   06/11/2015 12:29 AM 06/12/2015  2:59 PM Full Code [id number]  Charolette Forward, MD Inpatient   05/13/2015  4:59 PM 05/21/2015  8:16 PM Full Code [id number]  Radene Gunning, NP Inpatient   04/28/2015  6:22 PM 05/07/2015  1:02 AM Full Code [id number]  Charolette Forward, MD Inpatient   11/12/2012  5:26 PM 11/13/2012  6:01 PM Full Code [id number]  Birdie Riddle, MD ED      Chief Complaint  Patient presents with  . Medical management of chronic illnesses    Right femur fracture  S/P right hip intramedullary nail, CAD, hypertension, DM, hyperlipidemia, anemia, GI bleed history and constipation    HISTORY OF PRESENT ILLNESS:  This is a 70 year old female who is being seen for a routine visit. She was recently started on Xanax for anxiety and Lexapro for depression. Procel was recently started due to albumin of 2.7, low. She has been admitted to [facility] on 06/16/15 from [facility]. 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 She was recently discharged from the hospital. She had a fall (rolled out of her bed) and sustained a right intertrochanteric displaced femur fracture. She had right hip intramedullary nail on 06/12/15. She has been  admitted for a short-term rehabilitation.   PAST MEDICAL HISTORY:  Past Medical History  Diagnosis Date  . CHF (congestive heart failure) (HCC)     EF [id number], grade 1 diastolic dysfunction per echo 04/2015  . Diabetes ([place]) 2016    Type II. On insulin  . Hypertension   . CVA (cerebral infarction) 04/2015    Started Plavix 04/2015  . Patent foramen ovale 04/2015    Started on warfarin 04/2015  . Enchondroma of bone 2011    left femur.   . Stercoral ulcer of rectum 05/16/2015     CURRENT MEDICATIONS: Reviewed    Medication List       This list is accurate as of: 07/21/15 11:59 PM.  Always use your most recent med list.               atorvastatin 40 MG tablet  Commonly known as:  LIPITOR  Take 1 tablet (40 mg total) by mouth daily.     ferrous sulfate 325 (65 FE) MG tablet  Take 1 tablet (325 mg total) by mouth 3 (three) times daily with meals.     insulin glargine 100 UNIT/ML injection  Commonly known as:  LANTUS  Inject 0.05 mLs (5 Units total) into the skin at bedtime.     insulin lispro 100 UNIT/ML injection  Commonly known as:  HUMALOG  Inject 0-10 Units into the skin  3 (three) times daily before meals. 0-150=0 151-200=2 201-250=5 251-300=8 301-350=11 351-400=15 >401 call MD     metoprolol succinate 25 MG 24 hr tablet  Commonly known as:  TOPROL-XL  Take 1 tablet (25 mg total) by mouth daily.     miconazole 2 % cream  Commonly known as:  MICOTIN  Apply 1 application topically daily as needed (rash).     nitroGLYCERIN 0.4 MG SL tablet  Commonly known as:  NITROSTAT  Place 0.4 mg under the tongue every 5 (five) minutes as needed for chest pain.     pantoprazole 40 MG tablet  Commonly known as:  PROTONIX  Take 1 tablet (40 mg total) by mouth daily at 6 (six) AM.     polyethylene glycol packet  Commonly known as:  MIRALAX / GLYCOLAX  Take 17 g by mouth daily.     traMADol 50 MG tablet  Commonly known as:  ULTRAM  Take 1 tablet by mouth every 8 hours  as needed for pain 2-5 on a scale 1-10. Take 2 tablets by mouth every 8 hours as needed for pain 6-10 on a scale 1-10     warfarin 5 MG tablet  Commonly known as:  COUMADIN  Take 5-7.5 mg by mouth See admin instructions. Take 5 mg SUN,TUES,WED,FRI,SAT & 7.5 mg MON & THURS          No Known Allergies   REVIEW OF SYSTEMS:  Unable to obtain due to aphasia  PHYSICAL EXAMINATION  GENERAL APPEARANCE: Well nourished. In no acute distress. Normal body habitus SKIN:  Right hip surgical incision is covered with dry dressing, no erythema HEAD: Normal in size and contour. No evidence of trauma EYES: Lids open and close normally. No blepharitis, entropion or ectropion. PERRL. Conjunctivae are clear and sclerae are white. Lenses are without opacity EARS: Pinnae are normal. Patient hears normal voice tunes of the examiner MOUTH and THROAT: Lips are without lesions. Oral mucosa is moist and without lesions. Tongue is normal in shape, size, and color and without lesions NECK: supple, trachea midline, no neck masses, no thyroid tenderness, no thyromegaly LYMPHATICS: no LAN in the neck, no supraclavicular LAN RESPIRATORY: breathing is even & unlabored, BS CTAB CARDIAC: RRR, ,no extra heart sounds, no edema GI: abdomen soft, normal BS, no masses, no tenderness, no hepatomegaly, no splenomegaly EXTREMITIES:  Right hemiparesis (upper and lower extremities) NEURO:  aphasic PSYCHIATRIC: Alert and oriented to self. Affect and behavior are appropriate  LABS/RADIOLOGY: Labs reviewed: 07/09/15  WBC poor 0.3 hemoglobin 11.3 hematocrit 36.1 MCV 97.6 platelet 109 sodium 142 potassium 3.9 glucose 117 BUN 17 creatinine 0.9P calcium 9.8 07/02/15  WBC 6.4 hemoglobin 10.9 hematocrit 34.9 MCV 96.1 platelet 145 sodium 140 potassium 3.8 glucose 7.3 BUN 19 calcium 9.4 Basic Metabolic Panel:  Recent Labs  05/16/15 0445  06/10/15 1846 06/11/15 0138 06/12/15 1522 06/15/15 0405 06/18/15  NA 141  < > 141 140  --   136 140  K 3.5  < > 3.2* 3.4*  --  3.8 3.6  CL 112*  < > 109 107  --  101  --   CO2 24  < > 24 24  --  27  --   GLUCOSE 145*  < > 168* 147*  --  250*  --   BUN 8  < > 7 <5*  --  6 12  CREATININE 0.72  < > 0.65 0.60 0.70 0.66 0.6  CALCIUM 7.9*  < > 8.7* 8.8*  --  8.9  --   MG 1.7  --   --   --   --   --   --   PHOS 2.4*  --   --   --   --   --   --   < > = values in this interval not displayed. Liver Function Tests:  Recent Labs  04/28/15 2000 05/13/15 1259 06/15/15 0405  AST 17 33 24  ALT 17 33 23  ALKPHOS 81 61 58  BILITOT 0.7 0.6 0.7  PROT 6.8 6.1* 5.6*  ALBUMIN 4.1 3.6 2.7*   CBC:  Recent Labs  04/28/15 1414  05/13/15 1259  06/10/15 1846  06/12/15 1522 06/14/15 1320 06/15/15 0405 06/18/15  WBC 9.4  < > 16.7*  < > 10.2  < > 11.3* 9.7 8.8 8.9  NEUTROABS 6.1  --  10.5*  --  6.9  --   --   --   --   --   HGB 13.4  < > 15.5*  < > 10.7*  < > 10.2* 9.3* 9.2* 9.5*  HCT 41.0  < > 46.4*  < > 34.8*  < > 32.7* 30.4* 29.2* 31*  MCV 85.2  < > 85.5  < > 90.4  < > 90.3 89.7 88.8  --   PLT 210  < > 281  < > 292  < > 232 247 229 337  < > = values in this interval not displayed.  Lipid Panel:  Recent Labs  02/07/15 0845 04/29/15 0439  HDL 36* 44   Cardiac Enzymes:  Recent Labs  05/15/15 1630  TROPONINI <0.03   CBG:  Recent Labs  06/16/15 0609 06/16/15 1111 06/16/15 1626  GLUCAP 189* 218* 315*    Dg Shoulder Right  07/25/2015  CLINICAL DATA:  Fall in December 2016. Superior anterior and posterior shoulder pain. EXAM: RIGHT SHOULDER - 2+ VIEW COMPARISON:  None. FINDINGS: Mild degenerative changes in the glenohumeral joint with spurring. Loss of subacromial space, likely related to chronic rotator cuff disease. No acute bony abnormality. Specifically, no fracture, subluxation, or dislocation. Soft tissues are intact. IMPRESSION: No acute bony abnormality. Electronically Signed   By: Rolm Baptise M.D.   On: 07/25/2015 16:55    ASSESSMENT/PLAN:  Right  intertrochanteric displaced femur fracture S/P right hip intramedullary nail - continue rehabilitation; continue tramadol 50 mg 1-2  tabs by mouth every 8 hours when necessary for pain; Coumadin for DVT prophylaxis; follow-up with Dr. Terrence Dupont, orthopedic surgeon  CVA - continue Coumadin and rehabilitation  CAD - continue NTG when necessary   Hypertension - well controlled; continue metoprolol succinate 25 mg daily  Diabetes mellitus, type II - hemoglobin A1c 7.0; continue Lantus 5 units subcutaneous daily at bedtime and Humalog sliding scale subcutaneous 3 times a day before meals  Hyperlipidemia - continue Lipitor 40 mg 1 tab by mouth daily  Anemia, acute blood loss - hemoglobin 9.2; continue ferrous sulfate 325 mg 1 tab 3 times a day; re-check hgb 11.3  History of GI bleed - continue Protonix 40 mg daily  Constipation - continue MiraLAX 17 g by mouth daily when necessary  Anxiety - continue Xanax 0.25 mg 1 tab by mouth daily when necessary  Depression - continue Lexapro 10 mg 1 tab by mouth daily  Protein calorie malnutrition, severe - albumin 2.7; continue Procel 2 scoops by mouth twice a day     Goals of care:  Short-term rehabilitation    [facility], NP [facility] [phone]

## 2015-07-28 ENCOUNTER — Telehealth: Payer: Self-pay | Admitting: Neurology

## 2015-07-28 DIAGNOSIS — M25511 Pain in right shoulder: Secondary | ICD-10-CM

## 2015-07-28 MED ORDER — GABAPENTIN 100 MG PO CAPS: 100.0000 mg | ORAL_CAPSULE | Freq: Three times a day (TID) | ORAL | Status: DC

## 2015-07-28 NOTE — Telephone Encounter (Signed)
Spoke to [name] - agreeable to referral and new medication.  She will be discharged from [facility] in four days.  They will start her on the gabapentin.  Rx also sent to the pharmacy so she will have it at home to continue.

## 2015-07-28 NOTE — Telephone Encounter (Signed)
Left message for [name] (pt's son on [employer]).  Per Dr. Krista Blue, will refer to ortho and offer gabapentin 100mg  TID.

## 2015-07-28 NOTE — Telephone Encounter (Signed)
Please call patient, x-ray of right shoulder showed no acute bony abnormality

## 2015-07-28 NOTE — Addendum Note (Signed)
Addended by: Noberto Retort C on: 07/28/2015 12:47 PM   Modules accepted: Orders

## 2015-07-28 NOTE — Telephone Encounter (Signed)
Patient will be in [facility] for rehabilitation through 08/02/15 - ok per Dr. Krista Blue to give verbal orders for gabapentin 100mg , qhs - spoke to [place], Therapist, sports at [facility].  Once she is home, [name] will start slowly titrating her gabapentin dose up to 100mg , TID.

## 2015-07-30 ENCOUNTER — Ambulatory Visit: Payer: BC Managed Care – PPO | Admitting: Neurology

## 2015-07-30 ENCOUNTER — Non-Acute Institutional Stay (SKILLED_NURSING_FACILITY): Payer: Medicare Other | Admitting: Adult Health

## 2015-07-30 ENCOUNTER — Encounter: Payer: Self-pay | Admitting: Adult Health

## 2015-07-30 DIAGNOSIS — E785 Hyperlipidemia, unspecified: Secondary | ICD-10-CM | POA: Diagnosis not present

## 2015-07-30 DIAGNOSIS — I2510 Atherosclerotic heart disease of native coronary artery without angina pectoris: Secondary | ICD-10-CM

## 2015-07-30 DIAGNOSIS — S72141S Displaced intertrochanteric fracture of right femur, sequela: Secondary | ICD-10-CM

## 2015-07-30 DIAGNOSIS — E46 Unspecified protein-calorie malnutrition: Secondary | ICD-10-CM

## 2015-07-30 DIAGNOSIS — F32A Depression, unspecified: Secondary | ICD-10-CM

## 2015-07-30 DIAGNOSIS — K5901 Slow transit constipation: Secondary | ICD-10-CM | POA: Diagnosis not present

## 2015-07-30 DIAGNOSIS — I10 Essential (primary) hypertension: Secondary | ICD-10-CM | POA: Diagnosis not present

## 2015-07-30 DIAGNOSIS — I639 Cerebral infarction, unspecified: Secondary | ICD-10-CM | POA: Diagnosis not present

## 2015-07-30 DIAGNOSIS — Z8719 Personal history of other diseases of the digestive system: Secondary | ICD-10-CM

## 2015-07-30 DIAGNOSIS — F419 Anxiety disorder, unspecified: Secondary | ICD-10-CM

## 2015-07-30 DIAGNOSIS — F329 Major depressive disorder, single episode, unspecified: Secondary | ICD-10-CM

## 2015-07-30 DIAGNOSIS — E1159 Type 2 diabetes mellitus with other circulatory complications: Secondary | ICD-10-CM | POA: Diagnosis not present

## 2015-07-30 DIAGNOSIS — D62 Acute posthemorrhagic anemia: Secondary | ICD-10-CM

## 2015-07-30 DIAGNOSIS — E559 Vitamin D deficiency, unspecified: Secondary | ICD-10-CM

## 2015-07-30 NOTE — Progress Notes (Signed)
Patient ID: [name], female   DOB: 13-Jan-1946, 70 y.o.   MRN: [id number]    DATE:  07/30/15  MRN:  [id number]  BIRTHDAY: [birth year]  Facility:  Nursing Home Location:  [place] Room Number: [id number]  LEVEL OF CARE:  SNF (31)      Contact Information    Name Relation Home Work [place] Son [phone]     Glendon Axe   [phone]       Code Status History    Date Active Date Inactive Code Status Order ID Comments User Context   06/12/2015  2:59 PM 06/16/2015  9:04 PM Full Code [id number]  Lovett Calender, PA-C Inpatient   06/11/2015 12:29 AM 06/12/2015  2:59 PM Full Code [id number]  Charolette Forward, MD Inpatient   05/13/2015  4:59 PM 05/21/2015  8:16 PM Full Code [id number]  Radene Gunning, NP Inpatient   04/28/2015  6:22 PM 05/07/2015  1:02 AM Full Code [id number]  Charolette Forward, MD Inpatient   11/12/2012  5:26 PM 11/13/2012  6:01 PM Full Code [id number]  Birdie Riddle, MD ED      Chief Complaint  Patient presents with  . Medical management of chronic illnesses    Right femur fracture  S/P right hip intramedullary nail, CAD, hypertension, DM, hyperlipidemia, anemia, GI bleed history, vitamin D deficiency and constipation    HISTORY OF PRESENT ILLNESS:  This is a 70 year old female who is for discharge home with Home health PT for endurance, OT for ADLs, ST for cognitive skills and skilled Nurse for disease management. DME:   Wheelchair with leg rests, cushion, 3-in-1 bedside commode and semi-electric hospital bed.  She has been admitted to [facility] on 06/16/15 from [facility]. 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 She was recently discharged from the hospital. She had a fall (rolled out of her bed) and sustained a right  intertrochanteric displaced femur fracture. She had right hip intramedullary nail on 06/12/15.   Patient was admitted to this facility for short-term rehabilitation after the patient's recent hospitalization.  Patient has completed SNF rehabilitation and therapy has cleared the patient for discharge.  PAST MEDICAL HISTORY:  Past Medical History  Diagnosis Date  . CHF (congestive heart failure) (HCC)     EF [id number], grade 1 diastolic dysfunction per echo 04/2015  . Diabetes ([place]) 2016    Type II. On insulin  . Hypertension   . CVA (cerebral infarction) 04/2015    Started Plavix 04/2015  . Patent foramen ovale 04/2015    Started on warfarin 04/2015  . Enchondroma of bone 2011    left femur.   . Stercoral ulcer of rectum 05/16/2015  . Fracture, intertrochanteric, right femur ([place])   . Acute blood loss anemia   . Protein calorie malnutrition ([place])   . Hyperlipidemia   . History of lower GI bleeding   . 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 Depression      CURRENT MEDICATIONS: Reviewed    Medication List       This list is accurate as of: 07/30/15 10:31 PM.  Always use your most recent med list.               acetaminophen 500 MG tablet  Commonly known as:  TYLENOL  Take 500 mg by mouth 2 (two) times daily.     ALPRAZolam 0.25 MG tablet  Commonly known as:  XANAX  Take one tablet by mouth daily as needed for anxiety     atorvastatin 40 MG tablet  Commonly known as:  LIPITOR  Take 1 tablet (40 mg total) by mouth daily.     escitalopram 10 MG tablet  Commonly known as:  LEXAPRO  Take 1 tablet by mouth daily for anxiety     ferrous sulfate 325 (65 FE) MG tablet  Take 1 tablet (325 mg total) by mouth 3 (three) times daily with meals.     gabapentin 100 MG capsule  Commonly known as:  NEURONTIN  Take 100 mg by mouth at bedtime.     insulin lispro 100 UNIT/ML injection  Commonly known as:  HUMALOG  Inject  0-10 Units into the skin 3 (three) times daily before meals. 0-70 = initiate hypoglycemic protocol 71-120 = 0 units 121-150 - 2 units 150-200 = 3 units 201-250 = 5 units 251-300 = 8 units 301-350 = 11 units 351-400 = 15 units >401 call MD/NP     LANTUS 100 UNIT/ML injection  Generic drug:  insulin glargine  Inject 5 Units into the skin at bedtime.     metoprolol succinate 25 MG 24 hr tablet  Commonly known as:  TOPROL-XL  Take 25 mg by mouth daily. Hold for SBP <110 or HR <60     miconazole 2 % cream  Commonly known as:  MICOTIN  Apply 1 application topically daily as needed (rash).     nitroGLYCERIN 0.4 MG SL tablet  Commonly known as:  NITROSTAT  Place 0.4 mg under the tongue every 5 (five) minutes as needed for chest pain (up to 3 doses.  If no relief call MD/NP).     pantoprazole 40 MG tablet  Commonly known as:  PROTONIX  Take 1 tablet (40 mg total) by mouth daily at 6 (six) AM.     polyethylene glycol packet  Commonly known as:  MIRALAX / GLYCOLAX  Take 17 g by mouth daily.     traMADol 50 MG tablet  Commonly known as:  ULTRAM  Take 1 tablet by mouth every 8 hours as needed for pain 2-5 on a scale 1-10. Take 2 tablets by mouth every 8 hours as needed for pain 6-10 on a scale 1-10     Vitamin D2 400 units Tabs  Take 1 tablet by mouth daily.     warfarin 5 MG tablet  Commonly known as:  COUMADIN  Take 5 mg by mouth daily.          Allergies  Allergen Reactions  . Pork-Derived Products      REVIEW OF SYSTEMS:  Unable to obtain due to aphasia  PHYSICAL EXAMINATION  GENERAL APPEARANCE: Well nourished. In no acute distress. Normal body habitus SKIN:  Right hip surgical incision is covered with dry dressing, no erythema HEAD: Normal in size and contour. No evidence of trauma EYES: Lids open and close normally. No blepharitis, entropion or ectropion. PERRL. Conjunctivae are clear and sclerae are white. Lenses are without opacity EARS: Pinnae are normal. Patient  hears normal voice tunes of the examiner MOUTH and THROAT: Lips are without lesions. Oral mucosa is moist and without lesions. Tongue is normal in shape, size, and color and without lesions NECK: supple, trachea midline, no neck masses, no thyroid tenderness, no thyromegaly LYMPHATICS: no LAN  in the neck, no supraclavicular LAN RESPIRATORY: breathing is even & unlabored, BS CTAB CARDIAC: RRR, ,no extra heart sounds, no edema GI: abdomen soft, normal BS, no masses, no tenderness, no hepatomegaly, no splenomegaly EXTREMITIES:  Right hemiparesis (upper and lower extremities) NEURO:  aphasic PSYCHIATRIC: Alert and oriented to self. Affect and behavior are appropriate  LABS/RADIOLOGY: Labs reviewed: 07/09/15  WBC poor 0.3 hemoglobin 11.3 hematocrit 36.1 MCV 97.6 platelet 109 sodium 142 potassium 3.9 glucose 117 BUN 17 creatinine 0.9P calcium 9.8 07/02/15  WBC 6.4 hemoglobin 10.9 hematocrit 34.9 MCV 96.1 platelet 145 sodium 140 potassium 3.8 glucose 7.3 BUN 19 calcium 9.4 Basic Metabolic Panel:  Recent Labs  05/16/15 0445  06/10/15 1846 06/11/15 0138 06/12/15 1522 06/15/15 0405 06/18/15  NA 141  < > 141 140  --  136 140  K 3.5  < > 3.2* 3.4*  --  3.8 3.6  CL 112*  < > 109 107  --  101  --   CO2 24  < > 24 24  --  27  --   GLUCOSE 145*  < > 168* 147*  --  250*  --   BUN 8  < > 7 <5*  --  6 12  CREATININE 0.72  < > 0.65 0.60 0.70 0.66 0.6  CALCIUM 7.9*  < > 8.7* 8.8*  --  8.9  --   MG 1.7  --   --   --   --   --   --   PHOS 2.4*  --   --   --   --   --   --   < > = values in this interval not displayed. Liver Function Tests:  Recent Labs  04/28/15 2000 05/13/15 1259 06/15/15 0405  AST 17 33 24  ALT 17 33 23  ALKPHOS 81 61 58  BILITOT 0.7 0.6 0.7  PROT 6.8 6.1* 5.6*  ALBUMIN 4.1 3.6 2.7*   CBC:  Recent Labs  04/28/15 1414  05/13/15 1259  06/10/15 1846  06/12/15 1522 06/14/15 1320 06/15/15 0405 06/18/15  WBC 9.4  < > 16.7*  < > 10.2  < > 11.3* 9.7 8.8 8.9  NEUTROABS  6.1  --  10.5*  --  6.9  --   --   --   --   --   HGB 13.4  < > 15.5*  < > 10.7*  < > 10.2* 9.3* 9.2* 9.5*  HCT 41.0  < > 46.4*  < > 34.8*  < > 32.7* 30.4* 29.2* 31*  MCV 85.2  < > 85.5  < > 90.4  < > 90.3 89.7 88.8  --   PLT 210  < > 281  < > 292  < > 232 247 229 337  < > = values in this interval not displayed.  Lipid Panel:  Recent Labs  02/07/15 0845 04/29/15 0439  HDL 36* 44   Cardiac Enzymes:  Recent Labs  05/15/15 1630  TROPONINI <0.03   CBG:  Recent Labs  06/16/15 0609 06/16/15 1111 06/16/15 1626  GLUCAP 189* 218* 315*    Dg Shoulder Right  07/25/2015  CLINICAL DATA:  Fall in December 2016. Superior anterior and posterior shoulder pain. EXAM: RIGHT SHOULDER - 2+ VIEW COMPARISON:  None. FINDINGS: Mild degenerative changes in the glenohumeral joint with spurring. Loss of subacromial space, likely related to chronic rotator cuff disease. No acute bony abnormality. Specifically, no fracture, subluxation, or dislocation. Soft tissues are intact. IMPRESSION: No acute bony  abnormality. Electronically Signed   By: Rolm Baptise M.D.   On: 07/25/2015 16:55    ASSESSMENT/PLAN:  Right intertrochanteric displaced femur fracture S/P right hip intramedullary nail - for home health PT, OT, ST and skilled nurse ; continue tramadol 50 mg 1-2  tabs by mouth every 8 hours when necessary for pain; Coumadin for DVT prophylaxis; follow-up with Dr. Terrence Dupont, orthopedic surgeon  CVA - continue Coumadin and home health therapy  CAD - continue NTG when necessary   Hypertension - well controlled; continue metoprolol succinate 25 mg daily  Diabetes mellitus, type II - hemoglobin A1c 7.0; continue Lantus 5 units subcutaneous daily at bedtime and Humalog sliding scale subcutaneous 3 times a day before meals  Hyperlipidemia - continue Lipitor 40 mg 1 tab by mouth daily  Anemia, acute blood loss - hemoglobin 9.2; continue ferrous sulfate 325 mg 1 tab 3 times a day; re-check hgb  11.3  History of GI bleed - continue Protonix 40 mg daily  Constipation - continue MiraLAX 17 g by mouth daily when necessary  Anxiety - continue Xanax 0.25 mg 1 tab by mouth daily when necessary  Depression - continue Lexapro 10 mg 1 tab by mouth daily  Protein calorie malnutrition, severe - albumin 2.7; continue Procel 2 scoops by mouth twice a day  Vitamin D deficiency - recently started on vitamin D 2 400 units 1 tab by mouth daily       I have filled out patient's discharge paperwork and written prescriptions.  Patient will receive home health PT, OT, ST and skilled Nurse.  DME provided:   Wheelchair with leg rests, cushion, 3-in-1 bedside commode and semi-electric hospital bed  Total discharge time: Greater than 30 minutes  Discharge time involved coordination of the discharge process with Education officer, museum, nursing staff and therapy department. Medical justification for home health services/DME verified.     [facility], NP [employer] [phone]

## 2015-08-01 DIAGNOSIS — I639 Cerebral infarction, unspecified: Secondary | ICD-10-CM | POA: Diagnosis not present

## 2015-08-01 DIAGNOSIS — F17290 Nicotine dependence, other tobacco product, uncomplicated: Secondary | ICD-10-CM | POA: Diagnosis not present

## 2015-08-01 DIAGNOSIS — I2510 Atherosclerotic heart disease of native coronary artery without angina pectoris: Secondary | ICD-10-CM | POA: Diagnosis not present

## 2015-08-01 DIAGNOSIS — E785 Hyperlipidemia, unspecified: Secondary | ICD-10-CM | POA: Diagnosis not present

## 2015-08-01 DIAGNOSIS — E119 Type 2 diabetes mellitus without complications: Secondary | ICD-10-CM | POA: Diagnosis not present

## 2015-08-01 DIAGNOSIS — I10 Essential (primary) hypertension: Secondary | ICD-10-CM | POA: Diagnosis not present

## 2015-08-03 DIAGNOSIS — I69151 Hemiplegia and hemiparesis following nontraumatic intracerebral hemorrhage affecting right dominant side: Secondary | ICD-10-CM | POA: Diagnosis not present

## 2015-08-03 DIAGNOSIS — S72141D Displaced intertrochanteric fracture of right femur, subsequent encounter for closed fracture with routine healing: Secondary | ICD-10-CM | POA: Diagnosis not present

## 2015-08-04 DIAGNOSIS — I69151 Hemiplegia and hemiparesis following nontraumatic intracerebral hemorrhage affecting right dominant side: Secondary | ICD-10-CM | POA: Diagnosis not present

## 2015-08-04 DIAGNOSIS — S72141D Displaced intertrochanteric fracture of right femur, subsequent encounter for closed fracture with routine healing: Secondary | ICD-10-CM | POA: Diagnosis not present

## 2015-08-05 DIAGNOSIS — I69151 Hemiplegia and hemiparesis following nontraumatic intracerebral hemorrhage affecting right dominant side: Secondary | ICD-10-CM | POA: Diagnosis not present

## 2015-08-05 DIAGNOSIS — S72141D Displaced intertrochanteric fracture of right femur, subsequent encounter for closed fracture with routine healing: Secondary | ICD-10-CM | POA: Diagnosis not present

## 2015-08-06 DIAGNOSIS — S72141D Displaced intertrochanteric fracture of right femur, subsequent encounter for closed fracture with routine healing: Secondary | ICD-10-CM | POA: Diagnosis not present

## 2015-08-06 DIAGNOSIS — I69151 Hemiplegia and hemiparesis following nontraumatic intracerebral hemorrhage affecting right dominant side: Secondary | ICD-10-CM | POA: Diagnosis not present

## 2015-08-07 DIAGNOSIS — L97519 Non-pressure chronic ulcer of other part of right foot with unspecified severity: Secondary | ICD-10-CM | POA: Diagnosis not present

## 2015-08-07 DIAGNOSIS — I69151 Hemiplegia and hemiparesis following nontraumatic intracerebral hemorrhage affecting right dominant side: Secondary | ICD-10-CM | POA: Diagnosis not present

## 2015-08-07 DIAGNOSIS — I70293 Other atherosclerosis of native arteries of extremities, bilateral legs: Secondary | ICD-10-CM | POA: Diagnosis not present

## 2015-08-07 DIAGNOSIS — S72141D Displaced intertrochanteric fracture of right femur, subsequent encounter for closed fracture with routine healing: Secondary | ICD-10-CM | POA: Diagnosis not present

## 2015-08-07 DIAGNOSIS — B351 Tinea unguium: Secondary | ICD-10-CM | POA: Diagnosis not present

## 2015-08-07 DIAGNOSIS — E1151 Type 2 diabetes mellitus with diabetic peripheral angiopathy without gangrene: Secondary | ICD-10-CM | POA: Diagnosis not present

## 2015-08-08 ENCOUNTER — Other Ambulatory Visit (HOSPITAL_COMMUNITY): Payer: Self-pay | Admitting: Foot & Ankle Surgery

## 2015-08-08 DIAGNOSIS — L97511 Non-pressure chronic ulcer of other part of right foot limited to breakdown of skin: Secondary | ICD-10-CM

## 2015-08-09 DIAGNOSIS — I69151 Hemiplegia and hemiparesis following nontraumatic intracerebral hemorrhage affecting right dominant side: Secondary | ICD-10-CM | POA: Diagnosis not present

## 2015-08-09 DIAGNOSIS — S72141D Displaced intertrochanteric fracture of right femur, subsequent encounter for closed fracture with routine healing: Secondary | ICD-10-CM | POA: Diagnosis not present

## 2015-08-12 ENCOUNTER — Ambulatory Visit (HOSPITAL_COMMUNITY)
Admission: RE | Admit: 2015-08-12 | Discharge: 2015-08-12 | Disposition: A | Discharge status: Home / Self Care | Payer: Medicare Other | Source: Ambulatory Visit | Attending: Cardiology | Admitting: Cardiology

## 2015-08-12 ENCOUNTER — Telehealth: Payer: Self-pay | Admitting: Neurology

## 2015-08-12 DIAGNOSIS — R938 Abnormal findings on diagnostic imaging of other specified body structures: Secondary | ICD-10-CM | POA: Diagnosis not present

## 2015-08-12 DIAGNOSIS — S72141D Displaced intertrochanteric fracture of right femur, subsequent encounter for closed fracture with routine healing: Secondary | ICD-10-CM | POA: Diagnosis not present

## 2015-08-12 DIAGNOSIS — L97511 Non-pressure chronic ulcer of other part of right foot limited to breakdown of skin: Secondary | ICD-10-CM | POA: Insufficient documentation

## 2015-08-12 DIAGNOSIS — E119 Type 2 diabetes mellitus without complications: Secondary | ICD-10-CM | POA: Diagnosis not present

## 2015-08-12 DIAGNOSIS — I69151 Hemiplegia and hemiparesis following nontraumatic intracerebral hemorrhage affecting right dominant side: Secondary | ICD-10-CM | POA: Diagnosis not present

## 2015-08-12 DIAGNOSIS — I10 Essential (primary) hypertension: Secondary | ICD-10-CM | POA: Diagnosis not present

## 2015-08-12 DIAGNOSIS — E785 Hyperlipidemia, unspecified: Secondary | ICD-10-CM | POA: Diagnosis not present

## 2015-08-12 MED ORDER — GABAPENTIN 100 MG PO CAPS: 100.0000 mg | ORAL_CAPSULE | Freq: Three times a day (TID) | ORAL | Status: DC

## 2015-08-12 NOTE — Telephone Encounter (Signed)
Ok per Dr. Krista Blue to titrate her gabapentin dose up to three times daily.  I have spoken to her son, [name], with instructions.  New rx sent to the pharmacy.

## 2015-08-12 NOTE — Telephone Encounter (Signed)
Pt's son called and says mother has been taking gabapentin (NEURONTIN) 100 MG capsule once at night. He wants to know if it will be ok to increase the dosage still to the 3 times a day. Pleas call and advise [phone]

## 2015-08-13 DIAGNOSIS — I69151 Hemiplegia and hemiparesis following nontraumatic intracerebral hemorrhage affecting right dominant side: Secondary | ICD-10-CM | POA: Diagnosis not present

## 2015-08-13 DIAGNOSIS — M7541 Impingement syndrome of right shoulder: Secondary | ICD-10-CM | POA: Diagnosis not present

## 2015-08-13 DIAGNOSIS — S72141D Displaced intertrochanteric fracture of right femur, subsequent encounter for closed fracture with routine healing: Secondary | ICD-10-CM | POA: Diagnosis not present

## 2015-08-13 DIAGNOSIS — M25511 Pain in right shoulder: Secondary | ICD-10-CM | POA: Diagnosis not present

## 2015-08-14 ENCOUNTER — Telehealth: Payer: Self-pay | Admitting: Neurology

## 2015-08-14 DIAGNOSIS — I69151 Hemiplegia and hemiparesis following nontraumatic intracerebral hemorrhage affecting right dominant side: Secondary | ICD-10-CM | POA: Diagnosis not present

## 2015-08-14 DIAGNOSIS — S72141D Displaced intertrochanteric fracture of right femur, subsequent encounter for closed fracture with routine healing: Secondary | ICD-10-CM | POA: Diagnosis not present

## 2015-08-14 MED ORDER — BACLOFEN 10 MG PO TABS: 10.0000 mg | ORAL_TABLET | Freq: Three times a day (TID) | ORAL | Status: DC

## 2015-08-14 NOTE — Telephone Encounter (Signed)
Pt's son called said she was seen at [facility] yesterday (08/13/15) and she was given steroid injection in shoulder which it did help but this morning she woke up this morning with the same pain. He is going to call [facility] back for this condition. He also said she is having muscle spasm of the right leg(stroke side) which started Tuesday night (08/12/15). The spasm is pulling the leg up toward the torso and bends at the knee. He wants to know if there is a muscle relaxer she can take for this. It is happening once she lies down in the bed. He said she is not sleeping. He said last night he slept with his leg across her leg so when the contraction came he could keep her leg from moving. (Today is pt birthday-FYI)

## 2015-08-14 NOTE — Telephone Encounter (Signed)
Spoke to [place] - his mother is agreeable to baclofen and would like to see what the insurance will cover for Botox injections.

## 2015-08-14 NOTE — Telephone Encounter (Signed)
Vo by Dr. Krista Blue - provide rx for Baclofen 10mg , TID prn for muscle spasms.  Start working on approval for Botox injections.  Left [name] (her son) a message letting him know that the medication will be at the pharmacy and to please call me on Monday to discuss alternate treatments (Botox).

## 2015-08-15 DIAGNOSIS — S72141D Displaced intertrochanteric fracture of right femur, subsequent encounter for closed fracture with routine healing: Secondary | ICD-10-CM | POA: Diagnosis not present

## 2015-08-15 DIAGNOSIS — I69151 Hemiplegia and hemiparesis following nontraumatic intracerebral hemorrhage affecting right dominant side: Secondary | ICD-10-CM | POA: Diagnosis not present

## 2015-08-16 DIAGNOSIS — S72141D Displaced intertrochanteric fracture of right femur, subsequent encounter for closed fracture with routine healing: Secondary | ICD-10-CM | POA: Diagnosis not present

## 2015-08-16 DIAGNOSIS — I69151 Hemiplegia and hemiparesis following nontraumatic intracerebral hemorrhage affecting right dominant side: Secondary | ICD-10-CM | POA: Diagnosis not present

## 2015-08-18 DIAGNOSIS — S72141D Displaced intertrochanteric fracture of right femur, subsequent encounter for closed fracture with routine healing: Secondary | ICD-10-CM | POA: Diagnosis not present

## 2015-08-18 DIAGNOSIS — I69151 Hemiplegia and hemiparesis following nontraumatic intracerebral hemorrhage affecting right dominant side: Secondary | ICD-10-CM | POA: Diagnosis not present

## 2015-08-19 DIAGNOSIS — I69151 Hemiplegia and hemiparesis following nontraumatic intracerebral hemorrhage affecting right dominant side: Secondary | ICD-10-CM | POA: Diagnosis not present

## 2015-08-19 DIAGNOSIS — S72141D Displaced intertrochanteric fracture of right femur, subsequent encounter for closed fracture with routine healing: Secondary | ICD-10-CM | POA: Diagnosis not present

## 2015-08-20 DIAGNOSIS — E1151 Type 2 diabetes mellitus with diabetic peripheral angiopathy without gangrene: Secondary | ICD-10-CM | POA: Diagnosis not present

## 2015-08-20 DIAGNOSIS — I70293 Other atherosclerosis of native arteries of extremities, bilateral legs: Secondary | ICD-10-CM | POA: Diagnosis not present

## 2015-08-20 DIAGNOSIS — L97519 Non-pressure chronic ulcer of other part of right foot with unspecified severity: Secondary | ICD-10-CM | POA: Diagnosis not present

## 2015-08-20 DIAGNOSIS — S72141D Displaced intertrochanteric fracture of right femur, subsequent encounter for closed fracture with routine healing: Secondary | ICD-10-CM | POA: Diagnosis not present

## 2015-08-20 DIAGNOSIS — I69151 Hemiplegia and hemiparesis following nontraumatic intracerebral hemorrhage affecting right dominant side: Secondary | ICD-10-CM | POA: Diagnosis not present

## 2015-08-21 DIAGNOSIS — S72141D Displaced intertrochanteric fracture of right femur, subsequent encounter for closed fracture with routine healing: Secondary | ICD-10-CM | POA: Diagnosis not present

## 2015-08-21 DIAGNOSIS — I69151 Hemiplegia and hemiparesis following nontraumatic intracerebral hemorrhage affecting right dominant side: Secondary | ICD-10-CM | POA: Diagnosis not present

## 2015-08-22 DIAGNOSIS — S72141D Displaced intertrochanteric fracture of right femur, subsequent encounter for closed fracture with routine healing: Secondary | ICD-10-CM | POA: Diagnosis not present

## 2015-08-22 DIAGNOSIS — I69151 Hemiplegia and hemiparesis following nontraumatic intracerebral hemorrhage affecting right dominant side: Secondary | ICD-10-CM | POA: Diagnosis not present

## 2015-08-25 DIAGNOSIS — I69151 Hemiplegia and hemiparesis following nontraumatic intracerebral hemorrhage affecting right dominant side: Secondary | ICD-10-CM | POA: Diagnosis not present

## 2015-08-25 DIAGNOSIS — S72141D Displaced intertrochanteric fracture of right femur, subsequent encounter for closed fracture with routine healing: Secondary | ICD-10-CM | POA: Diagnosis not present

## 2015-08-26 DIAGNOSIS — S72141D Displaced intertrochanteric fracture of right femur, subsequent encounter for closed fracture with routine healing: Secondary | ICD-10-CM | POA: Diagnosis not present

## 2015-08-26 DIAGNOSIS — I69151 Hemiplegia and hemiparesis following nontraumatic intracerebral hemorrhage affecting right dominant side: Secondary | ICD-10-CM | POA: Diagnosis not present

## 2015-08-28 DIAGNOSIS — S72141D Displaced intertrochanteric fracture of right femur, subsequent encounter for closed fracture with routine healing: Secondary | ICD-10-CM | POA: Diagnosis not present

## 2015-08-28 DIAGNOSIS — I69151 Hemiplegia and hemiparesis following nontraumatic intracerebral hemorrhage affecting right dominant side: Secondary | ICD-10-CM | POA: Diagnosis not present

## 2015-08-29 ENCOUNTER — Ambulatory Visit: Payer: BC Managed Care – PPO | Admitting: Internal Medicine

## 2015-08-30 DIAGNOSIS — I69151 Hemiplegia and hemiparesis following nontraumatic intracerebral hemorrhage affecting right dominant side: Secondary | ICD-10-CM | POA: Diagnosis not present

## 2015-08-30 DIAGNOSIS — S72141D Displaced intertrochanteric fracture of right femur, subsequent encounter for closed fracture with routine healing: Secondary | ICD-10-CM | POA: Diagnosis not present

## 2015-09-02 ENCOUNTER — Other Ambulatory Visit: Payer: Self-pay | Admitting: Adult Health

## 2015-09-02 ENCOUNTER — Other Ambulatory Visit: Payer: Self-pay | Admitting: Neurology

## 2015-09-02 DIAGNOSIS — S72141D Displaced intertrochanteric fracture of right femur, subsequent encounter for closed fracture with routine healing: Secondary | ICD-10-CM | POA: Diagnosis not present

## 2015-09-02 DIAGNOSIS — I69151 Hemiplegia and hemiparesis following nontraumatic intracerebral hemorrhage affecting right dominant side: Secondary | ICD-10-CM | POA: Diagnosis not present

## 2015-09-03 DIAGNOSIS — S72141D Displaced intertrochanteric fracture of right femur, subsequent encounter for closed fracture with routine healing: Secondary | ICD-10-CM | POA: Diagnosis not present

## 2015-09-03 DIAGNOSIS — I69151 Hemiplegia and hemiparesis following nontraumatic intracerebral hemorrhage affecting right dominant side: Secondary | ICD-10-CM | POA: Diagnosis not present

## 2015-09-04 DIAGNOSIS — S72141D Displaced intertrochanteric fracture of right femur, subsequent encounter for closed fracture with routine healing: Secondary | ICD-10-CM | POA: Diagnosis not present

## 2015-09-04 DIAGNOSIS — I69151 Hemiplegia and hemiparesis following nontraumatic intracerebral hemorrhage affecting right dominant side: Secondary | ICD-10-CM | POA: Diagnosis not present

## 2015-09-05 DIAGNOSIS — S72141D Displaced intertrochanteric fracture of right femur, subsequent encounter for closed fracture with routine healing: Secondary | ICD-10-CM | POA: Diagnosis not present

## 2015-09-05 DIAGNOSIS — I69151 Hemiplegia and hemiparesis following nontraumatic intracerebral hemorrhage affecting right dominant side: Secondary | ICD-10-CM | POA: Diagnosis not present

## 2015-09-06 DIAGNOSIS — S72141D Displaced intertrochanteric fracture of right femur, subsequent encounter for closed fracture with routine healing: Secondary | ICD-10-CM | POA: Diagnosis not present

## 2015-09-06 DIAGNOSIS — I69151 Hemiplegia and hemiparesis following nontraumatic intracerebral hemorrhage affecting right dominant side: Secondary | ICD-10-CM | POA: Diagnosis not present

## 2015-09-08 DIAGNOSIS — I69151 Hemiplegia and hemiparesis following nontraumatic intracerebral hemorrhage affecting right dominant side: Secondary | ICD-10-CM | POA: Diagnosis not present

## 2015-09-08 DIAGNOSIS — S72141D Displaced intertrochanteric fracture of right femur, subsequent encounter for closed fracture with routine healing: Secondary | ICD-10-CM | POA: Diagnosis not present

## 2015-09-11 DIAGNOSIS — S72141D Displaced intertrochanteric fracture of right femur, subsequent encounter for closed fracture with routine healing: Secondary | ICD-10-CM | POA: Diagnosis not present

## 2015-09-11 DIAGNOSIS — I69151 Hemiplegia and hemiparesis following nontraumatic intracerebral hemorrhage affecting right dominant side: Secondary | ICD-10-CM | POA: Diagnosis not present

## 2015-09-12 DIAGNOSIS — S72141D Displaced intertrochanteric fracture of right femur, subsequent encounter for closed fracture with routine healing: Secondary | ICD-10-CM | POA: Diagnosis not present

## 2015-09-12 DIAGNOSIS — I69151 Hemiplegia and hemiparesis following nontraumatic intracerebral hemorrhage affecting right dominant side: Secondary | ICD-10-CM | POA: Diagnosis not present

## 2015-09-13 DIAGNOSIS — S72141D Displaced intertrochanteric fracture of right femur, subsequent encounter for closed fracture with routine healing: Secondary | ICD-10-CM | POA: Diagnosis not present

## 2015-09-13 DIAGNOSIS — I69151 Hemiplegia and hemiparesis following nontraumatic intracerebral hemorrhage affecting right dominant side: Secondary | ICD-10-CM | POA: Diagnosis not present

## 2015-09-16 DIAGNOSIS — I69151 Hemiplegia and hemiparesis following nontraumatic intracerebral hemorrhage affecting right dominant side: Secondary | ICD-10-CM | POA: Diagnosis not present

## 2015-09-16 DIAGNOSIS — S72141D Displaced intertrochanteric fracture of right femur, subsequent encounter for closed fracture with routine healing: Secondary | ICD-10-CM | POA: Diagnosis not present

## 2015-09-18 DIAGNOSIS — I69151 Hemiplegia and hemiparesis following nontraumatic intracerebral hemorrhage affecting right dominant side: Secondary | ICD-10-CM | POA: Diagnosis not present

## 2015-09-18 DIAGNOSIS — S72141D Displaced intertrochanteric fracture of right femur, subsequent encounter for closed fracture with routine healing: Secondary | ICD-10-CM | POA: Diagnosis not present

## 2015-09-19 DIAGNOSIS — S72141D Displaced intertrochanteric fracture of right femur, subsequent encounter for closed fracture with routine healing: Secondary | ICD-10-CM | POA: Diagnosis not present

## 2015-09-19 DIAGNOSIS — I69151 Hemiplegia and hemiparesis following nontraumatic intracerebral hemorrhage affecting right dominant side: Secondary | ICD-10-CM | POA: Diagnosis not present

## 2015-09-22 DIAGNOSIS — I69151 Hemiplegia and hemiparesis following nontraumatic intracerebral hemorrhage affecting right dominant side: Secondary | ICD-10-CM | POA: Diagnosis not present

## 2015-09-22 DIAGNOSIS — S72141D Displaced intertrochanteric fracture of right femur, subsequent encounter for closed fracture with routine healing: Secondary | ICD-10-CM | POA: Diagnosis not present

## 2015-09-24 DIAGNOSIS — S72141D Displaced intertrochanteric fracture of right femur, subsequent encounter for closed fracture with routine healing: Secondary | ICD-10-CM | POA: Diagnosis not present

## 2015-09-24 DIAGNOSIS — I69151 Hemiplegia and hemiparesis following nontraumatic intracerebral hemorrhage affecting right dominant side: Secondary | ICD-10-CM | POA: Diagnosis not present

## 2015-09-25 DIAGNOSIS — S72141D Displaced intertrochanteric fracture of right femur, subsequent encounter for closed fracture with routine healing: Secondary | ICD-10-CM | POA: Diagnosis not present

## 2015-09-25 DIAGNOSIS — I69151 Hemiplegia and hemiparesis following nontraumatic intracerebral hemorrhage affecting right dominant side: Secondary | ICD-10-CM | POA: Diagnosis not present

## 2015-09-26 DIAGNOSIS — I69151 Hemiplegia and hemiparesis following nontraumatic intracerebral hemorrhage affecting right dominant side: Secondary | ICD-10-CM | POA: Diagnosis not present

## 2015-09-26 DIAGNOSIS — S72141D Displaced intertrochanteric fracture of right femur, subsequent encounter for closed fracture with routine healing: Secondary | ICD-10-CM | POA: Diagnosis not present

## 2015-09-27 DIAGNOSIS — S72141D Displaced intertrochanteric fracture of right femur, subsequent encounter for closed fracture with routine healing: Secondary | ICD-10-CM | POA: Diagnosis not present

## 2015-09-27 DIAGNOSIS — I69151 Hemiplegia and hemiparesis following nontraumatic intracerebral hemorrhage affecting right dominant side: Secondary | ICD-10-CM | POA: Diagnosis not present

## 2015-09-29 DIAGNOSIS — I69151 Hemiplegia and hemiparesis following nontraumatic intracerebral hemorrhage affecting right dominant side: Secondary | ICD-10-CM | POA: Diagnosis not present

## 2015-09-29 DIAGNOSIS — S72141D Displaced intertrochanteric fracture of right femur, subsequent encounter for closed fracture with routine healing: Secondary | ICD-10-CM | POA: Diagnosis not present

## 2015-09-30 DIAGNOSIS — I69151 Hemiplegia and hemiparesis following nontraumatic intracerebral hemorrhage affecting right dominant side: Secondary | ICD-10-CM | POA: Diagnosis not present

## 2015-09-30 DIAGNOSIS — S72141D Displaced intertrochanteric fracture of right femur, subsequent encounter for closed fracture with routine healing: Secondary | ICD-10-CM | POA: Diagnosis not present

## 2015-10-01 DIAGNOSIS — I69151 Hemiplegia and hemiparesis following nontraumatic intracerebral hemorrhage affecting right dominant side: Secondary | ICD-10-CM | POA: Diagnosis not present

## 2015-10-01 DIAGNOSIS — S72141D Displaced intertrochanteric fracture of right femur, subsequent encounter for closed fracture with routine healing: Secondary | ICD-10-CM | POA: Diagnosis not present

## 2015-10-02 DIAGNOSIS — S72141D Displaced intertrochanteric fracture of right femur, subsequent encounter for closed fracture with routine healing: Secondary | ICD-10-CM | POA: Diagnosis not present

## 2015-10-02 DIAGNOSIS — I69151 Hemiplegia and hemiparesis following nontraumatic intracerebral hemorrhage affecting right dominant side: Secondary | ICD-10-CM | POA: Diagnosis not present

## 2015-10-04 DIAGNOSIS — S72141D Displaced intertrochanteric fracture of right femur, subsequent encounter for closed fracture with routine healing: Secondary | ICD-10-CM | POA: Diagnosis not present

## 2015-10-04 DIAGNOSIS — I69151 Hemiplegia and hemiparesis following nontraumatic intracerebral hemorrhage affecting right dominant side: Secondary | ICD-10-CM | POA: Diagnosis not present

## 2015-10-06 ENCOUNTER — Telehealth: Payer: Self-pay | Admitting: Neurology

## 2015-10-06 NOTE — Telephone Encounter (Signed)
Son [name] called to advise in the last week, BACLOFEN doesn't seem to be working as well, patient is having a lot of muscle trembling, contractions, "muscles are dancing, in a lot of pain, it's to the point that she cries", wonders if she needs an increase in BACLOFEN or needs to try another medication.

## 2015-10-07 ENCOUNTER — Encounter: Payer: Self-pay | Admitting: Neurology

## 2015-10-07 DIAGNOSIS — G8111 Spastic hemiplegia affecting right dominant side: Secondary | ICD-10-CM | POA: Insufficient documentation

## 2015-10-07 DIAGNOSIS — I69151 Hemiplegia and hemiparesis following nontraumatic intracerebral hemorrhage affecting right dominant side: Secondary | ICD-10-CM | POA: Diagnosis not present

## 2015-10-07 DIAGNOSIS — S72141D Displaced intertrochanteric fracture of right femur, subsequent encounter for closed fracture with routine healing: Secondary | ICD-10-CM | POA: Diagnosis not present

## 2015-10-07 NOTE — Telephone Encounter (Signed)
Per Dr. Krista Blue, she wants to get Xeomin authorized for this patient, as soon as possible. Called [name] to let him know we are working on the approval and to expect a call from [place] to schedule.

## 2015-10-08 ENCOUNTER — Ambulatory Visit: Payer: BC Managed Care – PPO | Admitting: Neurology

## 2015-10-09 ENCOUNTER — Emergency Department (HOSPITAL_COMMUNITY)
Admission: EM | Admit: 2015-10-09 | Discharge: 2015-10-09 | Disposition: A | Discharge status: Home / Self Care | Payer: Medicare Other | Attending: Emergency Medicine | Admitting: Emergency Medicine

## 2015-10-09 ENCOUNTER — Encounter (HOSPITAL_COMMUNITY): Payer: Self-pay | Admitting: Emergency Medicine

## 2015-10-09 DIAGNOSIS — E119 Type 2 diabetes mellitus without complications: Secondary | ICD-10-CM | POA: Insufficient documentation

## 2015-10-09 DIAGNOSIS — I509 Heart failure, unspecified: Secondary | ICD-10-CM | POA: Diagnosis not present

## 2015-10-09 DIAGNOSIS — F17210 Nicotine dependence, cigarettes, uncomplicated: Secondary | ICD-10-CM | POA: Diagnosis not present

## 2015-10-09 DIAGNOSIS — I69151 Hemiplegia and hemiparesis following nontraumatic intracerebral hemorrhage affecting right dominant side: Secondary | ICD-10-CM | POA: Diagnosis not present

## 2015-10-09 DIAGNOSIS — M7989 Other specified soft tissue disorders: Secondary | ICD-10-CM | POA: Diagnosis not present

## 2015-10-09 DIAGNOSIS — I10 Essential (primary) hypertension: Secondary | ICD-10-CM | POA: Diagnosis not present

## 2015-10-09 DIAGNOSIS — S72141D Displaced intertrochanteric fracture of right femur, subsequent encounter for closed fracture with routine healing: Secondary | ICD-10-CM | POA: Diagnosis not present

## 2015-10-09 NOTE — ED Notes (Signed)
Pts son reports that pt has had bilateral leg swelling chronically but it has been a lot worse over the last two days. Pt right leg has been more swollen than the left. Pt has had multiple dvt studies which were negative and is on coumadin. Pt alert x4. NAD at this time.

## 2015-10-09 NOTE — ED Notes (Signed)
Son states needs to talk mother home and is unable to wait any longer. RN appologetic about wait time, patient 6th to be taken back.  Rn discussed risks and benefits of leaving. Son sts patient has appointment with PCP tomorrow morning. Patient has not missed any coumadin doses and will continue to take tomorrows dose until appointment. Family will monitor for shortness of breath, chest pain, palpitations and fevers and chills and will come back if having those symptoms or new concerns.

## 2015-10-11 DIAGNOSIS — S72141D Displaced intertrochanteric fracture of right femur, subsequent encounter for closed fracture with routine healing: Secondary | ICD-10-CM | POA: Diagnosis not present

## 2015-10-11 DIAGNOSIS — I69151 Hemiplegia and hemiparesis following nontraumatic intracerebral hemorrhage affecting right dominant side: Secondary | ICD-10-CM | POA: Diagnosis not present

## 2015-10-13 DIAGNOSIS — S72141D Displaced intertrochanteric fracture of right femur, subsequent encounter for closed fracture with routine healing: Secondary | ICD-10-CM | POA: Diagnosis not present

## 2015-10-13 DIAGNOSIS — I69151 Hemiplegia and hemiparesis following nontraumatic intracerebral hemorrhage affecting right dominant side: Secondary | ICD-10-CM | POA: Diagnosis not present

## 2015-10-14 NOTE — Telephone Encounter (Signed)
Called patient to schedule her, VM was full and I was not able to leave a message. Will continue to try and reach her.

## 2015-10-15 ENCOUNTER — Ambulatory Visit: Payer: BC Managed Care – PPO | Admitting: Neurology

## 2015-10-15 NOTE — Telephone Encounter (Signed)
Patient has been scheduled

## 2015-10-16 DIAGNOSIS — I69151 Hemiplegia and hemiparesis following nontraumatic intracerebral hemorrhage affecting right dominant side: Secondary | ICD-10-CM | POA: Diagnosis not present

## 2015-10-16 DIAGNOSIS — S72141D Displaced intertrochanteric fracture of right femur, subsequent encounter for closed fracture with routine healing: Secondary | ICD-10-CM | POA: Diagnosis not present

## 2015-10-17 DIAGNOSIS — S72141D Displaced intertrochanteric fracture of right femur, subsequent encounter for closed fracture with routine healing: Secondary | ICD-10-CM | POA: Diagnosis not present

## 2015-10-17 DIAGNOSIS — I69151 Hemiplegia and hemiparesis following nontraumatic intracerebral hemorrhage affecting right dominant side: Secondary | ICD-10-CM | POA: Diagnosis not present

## 2015-10-20 DIAGNOSIS — I69151 Hemiplegia and hemiparesis following nontraumatic intracerebral hemorrhage affecting right dominant side: Secondary | ICD-10-CM | POA: Diagnosis not present

## 2015-10-20 DIAGNOSIS — S72141D Displaced intertrochanteric fracture of right femur, subsequent encounter for closed fracture with routine healing: Secondary | ICD-10-CM | POA: Diagnosis not present

## 2015-10-22 DIAGNOSIS — S72141D Displaced intertrochanteric fracture of right femur, subsequent encounter for closed fracture with routine healing: Secondary | ICD-10-CM | POA: Diagnosis not present

## 2015-10-22 DIAGNOSIS — I69151 Hemiplegia and hemiparesis following nontraumatic intracerebral hemorrhage affecting right dominant side: Secondary | ICD-10-CM | POA: Diagnosis not present

## 2015-10-24 ENCOUNTER — Encounter (HOSPITAL_COMMUNITY): Payer: Self-pay | Admitting: Emergency Medicine

## 2015-10-24 ENCOUNTER — Emergency Department (HOSPITAL_COMMUNITY)
Admission: EM | Admit: 2015-10-24 | Discharge: 2015-10-24 | Disposition: A | Discharge status: Home / Self Care | Payer: Medicare Other | Attending: Emergency Medicine | Admitting: Emergency Medicine

## 2015-10-24 ENCOUNTER — Emergency Department (HOSPITAL_COMMUNITY): Payer: Medicare Other

## 2015-10-24 ENCOUNTER — Emergency Department (HOSPITAL_BASED_OUTPATIENT_CLINIC_OR_DEPARTMENT_OTHER)
Admit: 2015-10-24 | Discharge: 2015-10-24 | Disposition: A | Discharge status: Home / Self Care | Payer: Medicare Other | Attending: Emergency Medicine | Admitting: Emergency Medicine

## 2015-10-24 ENCOUNTER — Telehealth: Payer: Self-pay | Admitting: Diagnostic Neuroimaging

## 2015-10-24 DIAGNOSIS — I10 Essential (primary) hypertension: Secondary | ICD-10-CM | POA: Diagnosis not present

## 2015-10-24 DIAGNOSIS — Z794 Long term (current) use of insulin: Secondary | ICD-10-CM | POA: Insufficient documentation

## 2015-10-24 DIAGNOSIS — Z7901 Long term (current) use of anticoagulants: Secondary | ICD-10-CM | POA: Insufficient documentation

## 2015-10-24 DIAGNOSIS — Z79899 Other long term (current) drug therapy: Secondary | ICD-10-CM | POA: Insufficient documentation

## 2015-10-24 DIAGNOSIS — Z8781 Personal history of (healed) traumatic fracture: Secondary | ICD-10-CM | POA: Diagnosis not present

## 2015-10-24 DIAGNOSIS — E119 Type 2 diabetes mellitus without complications: Secondary | ICD-10-CM | POA: Insufficient documentation

## 2015-10-24 DIAGNOSIS — E785 Hyperlipidemia, unspecified: Secondary | ICD-10-CM | POA: Insufficient documentation

## 2015-10-24 DIAGNOSIS — L03115 Cellulitis of right lower limb: Secondary | ICD-10-CM | POA: Diagnosis not present

## 2015-10-24 DIAGNOSIS — F329 Major depressive disorder, single episode, unspecified: Secondary | ICD-10-CM | POA: Insufficient documentation

## 2015-10-24 DIAGNOSIS — M7989 Other specified soft tissue disorders: Secondary | ICD-10-CM

## 2015-10-24 DIAGNOSIS — F17210 Nicotine dependence, cigarettes, uncomplicated: Secondary | ICD-10-CM | POA: Insufficient documentation

## 2015-10-24 DIAGNOSIS — I2510 Atherosclerotic heart disease of native coronary artery without angina pectoris: Secondary | ICD-10-CM | POA: Diagnosis not present

## 2015-10-24 DIAGNOSIS — Z86018 Personal history of other benign neoplasm: Secondary | ICD-10-CM | POA: Insufficient documentation

## 2015-10-24 DIAGNOSIS — G459 Transient cerebral ischemic attack, unspecified: Secondary | ICD-10-CM | POA: Insufficient documentation

## 2015-10-24 DIAGNOSIS — I639 Cerebral infarction, unspecified: Secondary | ICD-10-CM | POA: Diagnosis not present

## 2015-10-24 DIAGNOSIS — Z8774 Personal history of (corrected) congenital malformations of heart and circulatory system: Secondary | ICD-10-CM | POA: Insufficient documentation

## 2015-10-24 DIAGNOSIS — I509 Heart failure, unspecified: Secondary | ICD-10-CM | POA: Diagnosis not present

## 2015-10-24 DIAGNOSIS — E1165 Type 2 diabetes mellitus with hyperglycemia: Secondary | ICD-10-CM

## 2015-10-24 DIAGNOSIS — M25511 Pain in right shoulder: Secondary | ICD-10-CM | POA: Diagnosis present

## 2015-10-24 LAB — DIFFERENTIAL
Basophils Absolute: 0.1 10*3/uL (ref 0.0–0.1)
Basophils Relative: 1 %
EOS PCT: 3 %
Eosinophils Absolute: 0.2 10*3/uL (ref 0.0–0.7)
LYMPHS ABS: 1.9 10*3/uL (ref 0.7–4.0)
LYMPHS PCT: 24 %
MONO ABS: 0.7 10*3/uL (ref 0.1–1.0)
Monocytes Relative: 8 %
NEUTROS ABS: 5.3 10*3/uL (ref 1.7–7.7)
NEUTROS PCT: 64 %

## 2015-10-24 LAB — COMPREHENSIVE METABOLIC PANEL WITH GFR
ALT: 66 U/L — ABNORMAL HIGH (ref 14–54)
AST: 32 U/L (ref 15–41)
Albumin: 3.3 g/dL — ABNORMAL LOW (ref 3.5–5.0)
Alkaline Phosphatase: 92 U/L (ref 38–126)
Anion gap: 8 (ref 5–15)
BUN: 10 mg/dL (ref 6–20)
CO2: 25 mmol/L (ref 22–32)
Calcium: 9.4 mg/dL (ref 8.9–10.3)
Chloride: 105 mmol/L (ref 101–111)
Creatinine, Ser: 0.91 mg/dL (ref 0.44–1.00)
GFR calc Af Amer: >60 mL/min (ref >60)
GFR calc non Af Amer: >60 mL/min (ref >60)
Glucose, Bld: 294 mg/dL — ABNORMAL HIGH (ref 65–99)
Potassium: 4 mmol/L (ref 3.5–5.1)
Sodium: 138 mmol/L (ref 135–145)
Total Bilirubin: 0.5 mg/dL (ref 0.3–1.2)
Total Protein: 6.2 g/dL — ABNORMAL LOW (ref 6.5–8.1)

## 2015-10-24 LAB — CBC
HCT: 42.3 % (ref 36.0–46.0)
Hemoglobin: 13.1 g/dL (ref 12.0–15.0)
MCH: 26.6 pg (ref 26.0–34.0)
MCHC: 31 g/dL (ref 30.0–36.0)
MCV: 85.8 fL (ref 78.0–100.0)
Platelets: 219 K/uL (ref 150–400)
RBC: 4.93 MIL/uL (ref 3.87–5.11)
RDW: 14.5 % (ref 11.5–15.5)
WBC: 8.1 K/uL (ref 4.0–10.5)

## 2015-10-24 LAB — I-STAT TROPONIN, ED: Troponin i, poc: 0.01 ng/mL (ref 0.00–0.08)

## 2015-10-24 LAB — APTT: aPTT: 34 seconds (ref 24–37)

## 2015-10-24 LAB — PROTIME-INR
INR: 2.5 — ABNORMAL HIGH (ref 0.00–1.49)
Prothrombin Time: 26.7 seconds — ABNORMAL HIGH (ref 11.6–15.2)

## 2015-10-24 MED ORDER — MORPHINE SULFATE (PF) 4 MG/ML IV SOLN
4.0000 mg | Freq: Once | INTRAVENOUS | Status: AC
Start: 2015-10-24 — End: 2015-10-24
  Administered 2015-10-24: 4 mg via INTRAVENOUS
  Filled 2015-10-24: qty 1

## 2015-10-24 MED ORDER — INSULIN LISPRO 100 UNIT/ML ~~LOC~~ SOLN: 0.0000 [IU] | Freq: Three times a day (TID) | SUBCUTANEOUS | Status: DC

## 2015-10-24 MED ORDER — SULFAMETHOXAZOLE-TRIMETHOPRIM 800-160 MG PO TABS: 1.0000 | ORAL_TABLET | Freq: Two times a day (BID) | ORAL | Status: AC

## 2015-10-24 MED ORDER — ONDANSETRON HCL 4 MG/2ML IJ SOLN
4.0000 mg | Freq: Once | INTRAMUSCULAR | Status: AC
  Administered 2015-10-24: 4 mg via INTRAVENOUS
  Filled 2015-10-24: qty 2

## 2015-10-24 NOTE — Discharge Instructions (Signed)
°Carotid Artery Disease °The carotid arteries are the two main arteries on either side of the neck that supply blood to the brain. Carotid artery disease, also called carotid artery stenosis, is the narrowing or blockage of one or both carotid arteries. Carotid artery disease increases your risk for a stroke or a transient ischemic attack (TIA). A TIA is an episode in which a waxy, fatty substance that accumulates within the artery (plaque) blocks blood flow to the brain. A TIA is considered a "warning stroke."  °CAUSES  °· Buildup of plaque inside the carotid arteries (atherosclerosis) (common). °· A weakened outpouching in an artery (aneurysm). °· Inflammation of the carotid artery (arteritis). °· A fibrous growth within the carotid artery (fibromuscular dysplasia). °· Tissue death within the carotid artery due to radiation treatment (post-radiation necrosis). °· Decreased blood flow due to spasms of the carotid artery (vasospasm). °· Separation of the walls of the carotid artery (carotid dissection). °RISK FACTORS °· High cholesterol (dyslipidemia).   °· High blood pressure (hypertension).   °· Smoking.   °· Obesity.   °· Diabetes.   °· Family history of cardiovascular disease.   °· Inactivity or lack of regular exercise.   °· Being female. Men have an increased risk of developing atherosclerosis earlier in life than women.   °SYMPTOMS  °Carotid artery disease does not cause symptoms. °DIAGNOSIS °Diagnosis of carotid artery disease may include:  °· A physical exam. Your health care provider may hear an abnormal sound (bruit) when listening to the carotid arteries.   °· Specific tests that look at the blood flow in the carotid arteries. These tests include:   °¨ Carotid artery ultrasonography.   °¨ Carotid or cerebral angiography.   °¨ Computerized tomographic angiography (CTA).   °¨ Magnetic resonance angiography (MRA).   °TREATMENT  °Treatment of carotid artery disease can include a combination of treatments.  Treatment options include: °· Surgery. You may have:   °¨ A carotid endarterectomy. This is a surgery to remove the blockages in the carotid arteries.   °¨ A carotid angioplasty with stenting. This is a nonsurgical interventional procedure. A wire mesh (stent) is used to widen the blocked carotid arteries.   °· Medicines to control blood pressure, cholesterol, and reduce blood clotting (antiplatelet therapy).   °· Adjusting your diet.   °· Lifestyle changes such as:   °¨ Quitting smoking.   °¨ Exercising as tolerated or as directed by your health care provider.   °¨ Controlling and maintaining a good blood pressure.   °¨ Keeping cholesterol levels under control.   °HOME CARE INSTRUCTIONS  °· Take medicines only as directed by your health care provider. Make sure you understand all your medicine instructions. Do not stop your medicines without talking to your health care provider.   °· Follow your health care provider's diet instructions. It is important to eat a healthy diet that is low in saturated fats and includes plenty of fresh fruits, vegetables, and lean meats. High-fat, high-sodium foods as well as foods that are fried, overly processed, or have poor nutritional value should be avoided. °· Maintain a healthy weight.   °· Stay physically active. It is recommended that you get at least 30 minutes of activity every day.   °· Do not use any tobacco products including cigarettes, chewing tobacco, or electronic cigarettes. If you need help quitting, ask your health care provider. °· Limit alcohol use to:   °¨ No more than 2 drinks per day for men.   °¨ No more than 1 drink per day for nonpregnant women.   °· Do not use illegal drugs.   °· Keep all follow-up visits as directed by your health   care provider.   °SEEK IMMEDIATE MEDICAL CARE IF:  °You develop TIA or stroke symptoms. These include:  °· Sudden weakness or numbness on one side of the body, such as in the face, arm, or leg.   °· Sudden confusion.    °· Trouble speaking (aphasia) or understanding.   °· Sudden trouble seeing out of one or both eyes.   °· Sudden trouble walking.   °· Dizziness or feeling like you might faint.   °· Loss of balance or coordination.   °· Sudden severe headache with no known cause.   °· Sudden trouble swallowing (dysphagia).   °If you have any of these symptoms, call your local emergency services (911 in [place] Do not drive yourself to the clinic or hospital. This is a medical emergency.  °  °This information is not intended to replace advice given to you by your health care provider. Make sure you discuss any questions you have with your health care provider. °  °Document Released: 09/13/2011 Document Revised: 07/12/2014 Document Reviewed: 12/20/2012 °Elsevier Interactive Patient Education ©2016 [employer] Inc. ° °

## 2015-10-24 NOTE — Telephone Encounter (Signed)
Dr. Gilford Raid called to discuss patient. Apparently patient had stroke in October 2016 and is currently on Coumadin for anticoagulation. Apparently patient's family noted facial drooping and drooling and right leg swelling, called EMS and brought patient to hospital. CT scan shows no acute findings. Prior infarct in left frontal lobe going through expected evolution or change. INR 2.5.  Patient apparently stable at this time and asymptomatic. Patient has appointment with Dr. Krista Blue (primary neurologist) this coming Tuesday for Botox injection. Patient and emergency room physician are comfortable with discharge and close follow-up on Tuesday to discuss further medical management for stroke prevention.   Penni Bombard, MD [id number], [id number] PM Certified in Neurology, Neurophysiology and Neuroimaging  [facility] [address] [place], [place] [address] [phone]

## 2015-10-24 NOTE — ED Notes (Signed)
Per EMS: pt from home c/o possible episode of left sided weakness that is now resolved; pt has hx of stroke in October with some expressive aphasia; no neuro deficits noted; pt sts right shoulder pain and taking tramadol for same without improvement; CBG 314

## 2015-10-24 NOTE — ED Notes (Signed)
edp at bedside  

## 2015-10-24 NOTE — ED Provider Notes (Signed)
CSN: [id number]     Arrival date & time 10/24/15  1024 History   First MD Initiated Contact with Patient 10/24/15 1032     Chief Complaint  Patient presents with  . Transient Ischemic Attack  . Shoulder Pain     (Consider location/radiation/quality/duration/timing/severity/associated sxs/prior Treatment) The history is provided by the patient and a relative. The history is limited by the condition of the patient.  PT HAS A HX OF AN ISCHEMIC CVA IN October OF 2016.  THIS RESULTED IN RIGHT SIDED PARALYSIS AND DYSARTHRIA.  PT WAS D/C'D HOME ON PLAVIX AND COUMADIN, BUT HAD A GI BLEED, SO THE PLAVIX WAS STOPPED.  THE PT LIVES WITH HER SON AND HE GIVES THE HX AS PT IS UNABLE TO SPEAK MORE THAN YES OR NO.  PT DOES UNDERSTAND WHAT IS SAID. HE SAID THAT HE NOTICED A FACIAL DROOP AND DROOLING AND RIGHT LEG SWELLING TODAY.  HE CALLED EMS.  NOW, PT'S NEURO DEFICITS HAVE RESOLVED. Marland Kitchen Past Medical History  Diagnosis Date  . CHF (congestive heart failure) (HCC)     EF 99991111, grade 1 diastolic dysfunction per echo 04/2015  . Diabetes ([place]) 2016    Type II. On insulin  . Hypertension   . CVA (cerebral infarction) 04/2015    Started Plavix 04/2015  . Patent foramen ovale 04/2015    Started on warfarin 04/2015  . Enchondroma of bone 2011    left femur.   . Stercoral ulcer of rectum 05/16/2015  . Fracture, intertrochanteric, right femur ([place])   . Acute blood loss anemia   . Protein calorie malnutrition ([place])   . Hyperlipidemia   . History of lower GI bleeding   . 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 Depression    Past Surgical History  Procedure Laterality Date  . Cardiac surgery      Cath without stent  . Tee without cardioversion N/A 05/05/2015    Procedure: TRANSESOPHAGEAL ECHOCARDIOGRAM (TEE);  Surgeon: Dixie Dials, MD;  Location: [facility] ENDOSCOPY;  Service: Cardiovascular;  Laterality: N/A;  . Colonoscopy N/A 05/15/2015   Procedure: COLONOSCOPY;  Surgeon: Mauri Pole, MD;  Location: [place] ENDOSCOPY;  Service: Endoscopy;  Laterality: N/A;  . Flexible sigmoidoscopy N/A 05/15/2015    Procedure: FLEXIBLE SIGMOIDOSCOPY;  Surgeon: Mauri Pole, MD;  Location: [place] ENDOSCOPY;  Service: Endoscopy;  Laterality: N/A;  at bedside  . Intramedullary (im) nail intertrochanteric Right 06/12/2015    Procedure: INTRAMEDULLARY (IM) NAIL RIGHT HIP;  Surgeon: Renette Butters, MD;  Location: [place];  Service: Orthopedics;  Laterality: Right;   Family History  Problem Relation Age of Onset  . Hypertension Mother   . Heart failure Mother   . Hyperlipidemia Mother   . Heart attack Father    Social History  Substance Use Topics  . Smoking status: Current Every Day Smoker    Types: Cigarettes  . Smokeless tobacco: None     Comment: Quit 04/28/15  . Alcohol Use: No   OB History    No data available     Review of Systems  Neurological: Positive for facial asymmetry.  All other systems reviewed and are negative.     Allergies  Pork-derived products  Home Medications   Prior to Admission medications   Medication Sig Start Date End Date Taking? Authorizing Provider  acetaminophen (TYLENOL) 500 MG tablet Take 500 mg by mouth 2 (two) times daily.    Historical Provider, MD  ALPRAZolam Duanne Moron)  0.25 MG tablet Take one tablet by mouth daily as needed for anxiety    Historical Provider, MD  atorvastatin (LIPITOR) 40 MG tablet Take 1 tablet (40 mg total) by mouth daily. 05/06/15   Charolette Forward, MD  baclofen (LIORESAL) 10 MG tablet TAKE ONE TABLET BY MOUTH THREE TIMES DAILY 09/02/15   Marcial Pacas, MD  Ergocalciferol (VITAMIN D2) 400 units TABS Take 1 tablet by mouth daily.    Historical Provider, MD  escitalopram (LEXAPRO) 10 MG tablet Take 1 tablet by mouth daily for anxiety    Historical Provider, MD  ferrous sulfate 325 (65 FE) MG tablet Take 1 tablet (325 mg total) by mouth 3 (three) times daily with meals. 05/21/15    Charolette Forward, MD  gabapentin (NEURONTIN) 100 MG capsule Take 1 capsule (100 mg total) by mouth 3 (three) times daily. 08/12/15   Marcial Pacas, MD  insulin glargine (LANTUS) 100 UNIT/ML injection Inject 5 Units into the skin at bedtime.    Historical Provider, MD  insulin lispro (HUMALOG) 100 UNIT/ML injection Inject 0-10 Units into the skin 3 (three) times daily before meals. 0-70 = initiate hypoglycemic protocol 71-120 = 0 units 121-150 - 2 units 150-200 = 3 units 201-250 = 5 units 251-300 = 8 units 301-350 = 11 units 351-400 = 15 units >401 call MD/NP    Historical Provider, MD  metoprolol succinate (TOPROL-XL) 25 MG 24 hr tablet Take 25 mg by mouth daily. Hold for SBP <110 or HR <60    Historical Provider, MD  miconazole (MICOTIN) 2 % cream Apply 1 application topically daily as needed (rash).     Historical Provider, MD  nitroGLYCERIN (NITROSTAT) 0.4 MG SL tablet Place 0.4 mg under the tongue every 5 (five) minutes as needed for chest pain (up to 3 doses.  If no relief call MD/NP).     Historical Provider, MD  pantoprazole (PROTONIX) 40 MG tablet Take 1 tablet (40 mg total) by mouth daily at 6 (six) AM. 11/14/12   Charolette Forward, MD  polyethylene glycol (MIRALAX / GLYCOLAX) packet Take 17 g by mouth daily. 05/21/15   Charolette Forward, MD  traMADol (ULTRAM) 50 MG tablet Take 1 tablet by mouth every 8 hours as needed for pain 2-5 on a scale 1-10. Take 2 tablets by mouth every 8 hours as needed for pain 6-10 on a scale 1-10    Historical Provider, MD  warfarin (COUMADIN) 5 MG tablet Take 5 mg by mouth daily.     Historical Provider, MD   BP 147/89 mmHg  Pulse 66  Temp(Src) 97.5 F (36.4 C) (Oral)  Resp 17  SpO2 99% Physical Exam  Constitutional: She appears well-developed and well-nourished.  HENT:  Head: Normocephalic and atraumatic.  Eyes: Conjunctivae and EOM are normal. Pupils are equal, round, and reactive to light.  Neck: Normal range of motion. Neck supple.  Cardiovascular: Normal  rate, regular rhythm and normal heart sounds.   Pulmonary/Chest: Effort normal and breath sounds normal.  Abdominal: Soft. Bowel sounds are normal.  Musculoskeletal: She exhibits edema.  RIGHT LEG MUCH MORE SWOLLEN THAN LEFT  Neurological: She is alert.  RIGHT ARM AND LEG PARALYSIS.  DYSARTHRIA.  ALL OLD.  Skin: Skin is warm. There is erythema.  RIGHT LEG SWOLLEN AND RED  Psychiatric: She has a normal mood and affect.  Nursing note and vitals reviewed.   ED Course  Procedures (including critical care time) Labs Review Labs Reviewed  PROTIME-INR  APTT  CBC  DIFFERENTIAL  COMPREHENSIVE  METABOLIC PANEL  URINALYSIS, ROUTINE W REFLEX MICROSCOPIC (NOT AT [facility])  I-STAT TROPOININ, ED    Imaging Review No results found. I have personally reviewed and evaluated these images and lab results as part of my medical decision-making.   EKG Interpretation None      MDM  I WANTED TO SPEAK TO DR. YAN (PT'S NEURO) ABOUT SX DESPITE BEING THERAPEUTIC ON COUMADIN.  THE ON CALL PHYSICIAN (DR. PENUMALLI) WAS IN A MEETING AND IT TOOK A VERY LONG TIME FOR A CALL BACK.  BUT I DID EVENTUALLY TALK WITH HIM.  NO CHANGE IN COUMADIN.  HE DOES RECOMMEND A CAROTID US AS AN OUTPATIENT.  F/U WITH DR. YAN. Final diagnoses:  None  TIA  POORLY CONTROLLED TYPE 2 DIABETES  RIGHT LEG CELLULITIS  Isla Pence, MD 10/25/15 [phone]

## 2015-10-24 NOTE — Progress Notes (Signed)
Preliminary results by tech - Right Lower Ext. Venous Duplex Completed. Negative for deep and superficial vein thrombosis in the right leg.  Shan Valdes, BS, RDMS, RVT  

## 2015-10-27 NOTE — Telephone Encounter (Signed)
Spoke with the pharmacy who stated that the patient needed to complete new patient enrollment. I called the patient to inform her but got no answer. Will call again later.

## 2015-10-27 NOTE — Telephone Encounter (Signed)
Called patient and left a VM requesting she call me back.

## 2015-10-28 ENCOUNTER — Ambulatory Visit (INDEPENDENT_AMBULATORY_CARE_PROVIDER_SITE_OTHER): Payer: Medicare Other | Admitting: Neurology

## 2015-10-28 ENCOUNTER — Encounter: Payer: Self-pay | Admitting: Neurology

## 2015-10-28 ENCOUNTER — Telehealth: Payer: Self-pay | Admitting: Neurology

## 2015-10-28 VITALS — BP 132/72 | HR 66

## 2015-10-28 DIAGNOSIS — I639 Cerebral infarction, unspecified: Secondary | ICD-10-CM

## 2015-10-28 DIAGNOSIS — F05 Delirium due to known physiological condition: Secondary | ICD-10-CM

## 2015-10-28 DIAGNOSIS — Z8673 Personal history of transient ischemic attack (TIA), and cerebral infarction without residual deficits: Secondary | ICD-10-CM

## 2015-10-28 DIAGNOSIS — M25511 Pain in right shoulder: Secondary | ICD-10-CM | POA: Diagnosis not present

## 2015-10-28 DIAGNOSIS — I2510 Atherosclerotic heart disease of native coronary artery without angina pectoris: Secondary | ICD-10-CM | POA: Diagnosis not present

## 2015-10-28 DIAGNOSIS — G8111 Spastic hemiplegia affecting right dominant side: Secondary | ICD-10-CM | POA: Diagnosis not present

## 2015-10-28 MED ORDER — BACLOFEN 10 MG PO TABS: 10.0000 mg | ORAL_TABLET | Freq: Three times a day (TID) | ORAL | Status: DC

## 2015-10-28 MED ORDER — TRAMADOL HCL 50 MG PO TABS: 50.0000 mg | ORAL_TABLET | Freq: Three times a day (TID) | ORAL | Status: DC | PRN

## 2015-10-28 MED ORDER — GABAPENTIN 300 MG PO CAPS: 300.0000 mg | ORAL_CAPSULE | Freq: Three times a day (TID) | ORAL | Status: DC

## 2015-10-28 NOTE — Telephone Encounter (Signed)
Called the patient again to inform her of change in apt today. Left a VM asking her to return my call.

## 2015-10-28 NOTE — Telephone Encounter (Signed)
[name] returned Danielle's call.

## 2015-10-28 NOTE — Progress Notes (Signed)
Chief Complaint  Patient presents with  . Cerebrovascular Accident    She is here with her son, [name].  His brother reported, on 10/24/15, he noticed facial drooping and excessive drooling. She was taken to the ED for evaluation.  She underwent a normal CT and was told to keep her follow up here to discuss stroke prevention.       PATIENT: [name] DOB: [birth year]  Chief Complaint  Patient presents with  . Cerebrovascular Accident    She is here with her son, [name].  His brother reported, on 10/24/15, he noticed facial drooping and excessive drooling. She was taken to the ED for evaluation.  She underwent a normal CT and was told to keep her follow up here to discuss stroke prevention.      HISTORICAL  [name] distant years old right-handed female, follow-up her most recent stroke in April 28 2015  She had a history of hypertension, diabetes, hyperlipidemia, not on any anticoagulation treatment prior to hospital admission.  In April 27 2016, she presenting with acute onset slurred speech, right leg and arm weakness, I personally reviewed MRI of the brain showed evidence of left ACA infarction, with left A2 occlusion,  She was found to have patent foraminal [place] by TEE in October 2016, was put on Coumadin as stroke prevention  She also suffered right hip fracture require surgery in June 12 2015, she is now at [facility] place, will be back home with her son in next few days  She also complains of few days' history of severe right shoulder pain, right shoulder tightness, limited range of motion. I reviewed laboratory, LDL 121, A1c 11 point 6,  Repeat Lab in 12 2016, Hg 9.5, A1c 7.0.  UPDATE October 28 2015: Patient was taken to the emergency room in October 24 2015, she was noted to have increased confusion, facial drooping, increased right shoulder pain, her some [name] who brought her to clinic today, stated when she has severe right shoulder pain, she often become mild  confused, usually improved after she taking baclofen, gabapentin tramadol, but the medications over and put her into sleep, sometimes she has intermittent muscle spasm at right arm, right leg,  I personally reviewed CAT scan of the brain without contrast October 24 2015, evidence of left ACA territory stroke at the left frontal lobe, there is no acute lesion  I reviewed laboratory evaluation, normal CMP with exception of elevated glucose 290, normal CBC, INR was 2.5, troponin was negative  Before stroke, she was highly functional," 100%", now she is no longer ambulatory, complains constant right shoulder pain, has no meaningful movement of right arm or leg, significant aphasia  X-ray of her right shoulder in January 2017 showed no significant abnormality  REVIEW OF SYSTEMS: Full 14 system review of systems performed and notable only for right shoulder pain  ALLERGIES: Allergies  Allergen Reactions  . Pork-Derived Products     HOME MEDICATIONS: Current Outpatient Prescriptions  Medication Sig Dispense Refill  . atorvastatin (LIPITOR) 40 MG tablet Take 1 tablet (40 mg total) by mouth daily. 30 tablet 3  . baclofen (LIORESAL) 10 MG tablet TAKE ONE TABLET BY MOUTH THREE TIMES DAILY 30 tablet 11  . Ergocalciferol (VITAMIN D2) 400 units TABS Take 1 tablet by mouth daily.    Marland Kitchen escitalopram (LEXAPRO) 10 MG tablet Take 10 mg by mouth daily as needed (anxiety). Take 1 tablet by mouth daily for anxiety    . ferrous sulfate 325 (65  FE) MG tablet Take 1 tablet (325 mg total) by mouth 3 (three) times daily with meals. 90 tablet 3  . gabapentin (NEURONTIN) 100 MG capsule Take 1 capsule (100 mg total) by mouth 3 (three) times daily. (Patient taking differently: Take 100 mg by mouth 3 (three) times daily as needed (nerve pain). ) 90 capsule 5  . insulin glargine (LANTUS) 100 UNIT/ML injection Inject 11 Units into the skin at bedtime.     . insulin lispro (HUMALOG) 100 UNIT/ML injection Inject 0-0.1 mLs (0-10  Units total) into the skin 3 (three) times daily before meals. 0-70 = initiate hypoglycemic protocol 71-120 = 0 units 121-150 - 2 units 150-200 = 3 units 201-250 = 5 units 251-300 = 8 units 301-350 = 11 units 351-400 = 15 units >401 call MD/NP 10 mL 0  . metoprolol succinate (TOPROL-XL) 25 MG 24 hr tablet Take 25 mg by mouth daily. Hold for SBP <110 or HR <60    . nitroGLYCERIN (NITROSTAT) 0.4 MG SL tablet Place 0.4 mg under the tongue every 5 (five) minutes as needed for chest pain (up to 3 doses.  If no relief call MD/NP).     . pantoprazole (PROTONIX) 40 MG tablet Take 1 tablet (40 mg total) by mouth daily at 6 (six) AM. 30 tablet 3  . polyethylene glycol (MIRALAX / GLYCOLAX) packet Take 17 g by mouth daily. (Patient taking differently: Take 17 g by mouth daily as needed for mild constipation. ) 14 each 1  . sulfamethoxazole-trimethoprim (BACTRIM DS,SEPTRA DS) 800-160 MG tablet Take 1 tablet by mouth 2 (two) times daily. 14 tablet 0  . traMADol (ULTRAM) 50 MG tablet Take 1 tablet by mouth every 8 hours as needed for pain 2-5 on a scale 1-10. Take 2 tablets by mouth every 8 hours as needed for pain 6-10 on a scale 1-10    . warfarin (COUMADIN) 5 MG tablet Take 5 mg by mouth daily.      No current facility-administered medications for this visit.    PAST MEDICAL HISTORY: Past Medical History  Diagnosis Date  . CHF (congestive heart failure) (HCC)     EF 99991111, grade 1 diastolic dysfunction per echo 04/2015  . Diabetes ([place]) 2016    Type II. On insulin  . Hypertension   . CVA (cerebral infarction) 04/2015    Started Plavix 04/2015  . Patent foramen ovale 04/2015    Started on warfarin 04/2015  . Enchondroma of bone 2011    left femur.   . Stercoral ulcer of rectum 05/16/2015  . Fracture, intertrochanteric, right femur ([place])   . Acute blood loss anemia   . Protein calorie malnutrition ([place])   . Hyperlipidemia   . History of lower GI bleeding   . Coronary artery disease  involving native artery of transplanted heart without angina pectoris   . Depression     PAST SURGICAL HISTORY: Past Surgical History  Procedure Laterality Date  . Cardiac surgery      Cath without stent  . Tee without cardioversion N/A 05/05/2015    Procedure: TRANSESOPHAGEAL ECHOCARDIOGRAM (TEE);  Surgeon: Dixie Dials, MD;  Location: [facility] ENDOSCOPY;  Service: Cardiovascular;  Laterality: N/A;  . Colonoscopy N/A 05/15/2015    Procedure: COLONOSCOPY;  Surgeon: Mauri Pole, MD;  Location: [place] ENDOSCOPY;  Service: Endoscopy;  Laterality: N/A;  . Flexible sigmoidoscopy N/A 05/15/2015    Procedure: FLEXIBLE SIGMOIDOSCOPY;  Surgeon: Mauri Pole, MD;  Location: [place] ENDOSCOPY;  Service: Endoscopy;  Laterality: N/A;  at bedside  . Intramedullary (im) nail intertrochanteric Right 06/12/2015    Procedure: INTRAMEDULLARY (IM) NAIL RIGHT HIP;  Surgeon: Renette Butters, MD;  Location: [place];  Service: Orthopedics;  Laterality: Right;    FAMILY HISTORY: Family History  Problem Relation Age of Onset  . Hypertension Mother   . Heart failure Mother   . Hyperlipidemia Mother   . Heart attack Father     SOCIAL HISTORY:  Social History   Social History  . Marital Status: Married    Spouse Name: N/A  . Number of Children: 6  . Years of Education: 12   Occupational History  . Retired    Social History Main Topics  . Smoking status: Current Every Day Smoker    Types: Cigarettes  . Smokeless tobacco: Not on file     Comment: Quit 04/28/15  . Alcohol Use: No  . Drug Use: No  . Sexual Activity: Not on file   Other Topics Concern  . Not on file   Social History Narrative   She will be living with her two sons.\   Right-handed.   No caffeine use.     PHYSICAL EXAM   Filed Vitals:   10/28/15 1515  BP: 132/72  Pulse: 66    Not recorded      There is no weight on file to calculate BMI.  PHYSICAL EXAMNIATION:  Gen: NAD, conversant, well nourised, obese, well  groomed                     Cardiovascular: Regular rate rhythm, no peripheral edema, warm, nontender. Eyes: Conjunctivae clear without exudates or hemorrhage Neck: Supple, no carotid bruise. Pulmonary: Clear to auscultation bilaterally   NEUROLOGICAL EXAM:  MENTAL STATUS: Speech/cognition: She has expressive aphasia, following commands, able to onset question by yes or no, not intelligible speech,   CRANIAL NERVES: CN II: Right visual field deficit on double spontaneous stimulation, Pupils are round equal and briskly reactive to light. CN III, IV, VI: extraocular movement are normal. No ptosis. CN V: Facial sensation is intact to pinprick in all 3 divisions bilaterally. Corneal responses are intact.  CN VII: Face is symmetric with normal eye closure and smile. CN VIII: Hearing is normal to rubbing fingers CN IX, X: Palate elevates symmetrically. Phonation is normal. CN XI: Head turning and shoulder shrug are intact CN XII: Tongue is midline with normal movements and no atrophy.  MOTOR: She only has trace movement of right lower extremity, complains of significant right shoulder pain, limited range of motion of right shoulder, right proximal was 3 and distal strength was 4  REFLEXES: Hyperreflexia on the right arm, right leg, plantar responses are flexor bilaterally  SENSORY: Intact to light touch, pinprick and vibratory sensation  COORDINATION: Rapid alternating movements and fine finger movements are intact. There is no dysmetria on finger-to-nose and heel-knee-shin.    GAIT/STANCE: She has difficulty bearing weight even with assistant   DIAGNOSTIC DATA (LABS, IMAGING, TESTING) - I reviewed patient records, labs, notes, testing and imaging myself where available.   ASSESSMENT AND PLAN  KYMORA SPINNATO is a 70 y.o. female   Left ACA stroke, left A2 occlusion with residual spastic right hemiparesis,  She has evidence of patent foraminal ovale is taking Coumadin  EMG  guided botulism toxin injection for her right shoulder pain  Acute onset of confusion  She has evidence of left frontal encephalomalacia, at the high risk for developing complex partial seizure  Proceed  with EEG  Right shoulder pain  Continue gabapentin, baclofen, tramadol as needed  Vitamin D deficiency  Continue vitamin D supplement   Marcial Pacas, M.D. Ph.D.  [facility] [address], [place], [place] [id number] Ph: [phone] Fax: [phone]  CC: Referring Provider

## 2015-10-29 NOTE — Telephone Encounter (Signed)
Spoke with [name] in person. Scheduled his mothers apt for next week.

## 2015-10-30 DIAGNOSIS — I69151 Hemiplegia and hemiparesis following nontraumatic intracerebral hemorrhage affecting right dominant side: Secondary | ICD-10-CM | POA: Diagnosis not present

## 2015-10-30 DIAGNOSIS — S72141D Displaced intertrochanteric fracture of right femur, subsequent encounter for closed fracture with routine healing: Secondary | ICD-10-CM | POA: Diagnosis not present

## 2015-10-31 DIAGNOSIS — S72141D Displaced intertrochanteric fracture of right femur, subsequent encounter for closed fracture with routine healing: Secondary | ICD-10-CM | POA: Diagnosis not present

## 2015-10-31 DIAGNOSIS — I69151 Hemiplegia and hemiparesis following nontraumatic intracerebral hemorrhage affecting right dominant side: Secondary | ICD-10-CM | POA: Diagnosis not present

## 2015-10-31 NOTE — Telephone Encounter (Signed)
Called pharmacy to schedule delivery. They are still waiting on the patient to call in. Called and left a message for the patient with the number to the pharmacy [phone].

## 2015-11-03 DIAGNOSIS — I10 Essential (primary) hypertension: Secondary | ICD-10-CM | POA: Diagnosis not present

## 2015-11-03 DIAGNOSIS — E785 Hyperlipidemia, unspecified: Secondary | ICD-10-CM | POA: Diagnosis not present

## 2015-11-03 DIAGNOSIS — I639 Cerebral infarction, unspecified: Secondary | ICD-10-CM | POA: Diagnosis not present

## 2015-11-03 DIAGNOSIS — F17290 Nicotine dependence, other tobacco product, uncomplicated: Secondary | ICD-10-CM | POA: Diagnosis not present

## 2015-11-03 DIAGNOSIS — I2510 Atherosclerotic heart disease of native coronary artery without angina pectoris: Secondary | ICD-10-CM | POA: Diagnosis not present

## 2015-11-03 DIAGNOSIS — E119 Type 2 diabetes mellitus without complications: Secondary | ICD-10-CM | POA: Diagnosis not present

## 2015-11-03 NOTE — Telephone Encounter (Signed)
[name] called for [employer]. Please call him at [phone]

## 2015-11-03 NOTE — Telephone Encounter (Signed)
Spoke with [name] and relayed the phone number for the specialty pharmacy. He stated that he would call.

## 2015-11-03 NOTE — Telephone Encounter (Signed)
Called the patients son [name] and left a VM asking him to return my call.

## 2015-11-03 NOTE — Telephone Encounter (Signed)
Called and spoke with the patients son Nicole Kindred, he stated that he would have his brother [name] call me when he got in. I informed him that the speciality pharmacy needed to be called for her upcoming apt on Thursday. He said that his brother will call me back regarding this.

## 2015-11-03 NOTE — Telephone Encounter (Signed)
Called patient to inform her that SP needed her consent to ship medication she did not answer. Left a VM asking her to return my call.

## 2015-11-03 NOTE — Telephone Encounter (Signed)
Called son [name] and left a VM asking him to return my call or we would have to reschedule his mothers apt.

## 2015-11-04 DIAGNOSIS — S72141D Displaced intertrochanteric fracture of right femur, subsequent encounter for closed fracture with routine healing: Secondary | ICD-10-CM | POA: Diagnosis not present

## 2015-11-04 DIAGNOSIS — I69151 Hemiplegia and hemiparesis following nontraumatic intracerebral hemorrhage affecting right dominant side: Secondary | ICD-10-CM | POA: Diagnosis not present

## 2015-11-04 NOTE — Telephone Encounter (Signed)
[name] called this morning and canceled apt. Stated that he would call back to reschedule.

## 2015-11-05 DIAGNOSIS — I69151 Hemiplegia and hemiparesis following nontraumatic intracerebral hemorrhage affecting right dominant side: Secondary | ICD-10-CM | POA: Diagnosis not present

## 2015-11-05 DIAGNOSIS — S72141D Displaced intertrochanteric fracture of right femur, subsequent encounter for closed fracture with routine healing: Secondary | ICD-10-CM | POA: Diagnosis not present

## 2015-11-06 ENCOUNTER — Ambulatory Visit: Payer: Medicare Other | Admitting: Neurology

## 2015-11-07 DIAGNOSIS — S72141D Displaced intertrochanteric fracture of right femur, subsequent encounter for closed fracture with routine healing: Secondary | ICD-10-CM | POA: Diagnosis not present

## 2015-11-07 DIAGNOSIS — I69151 Hemiplegia and hemiparesis following nontraumatic intracerebral hemorrhage affecting right dominant side: Secondary | ICD-10-CM | POA: Diagnosis not present

## 2015-11-08 DIAGNOSIS — I69151 Hemiplegia and hemiparesis following nontraumatic intracerebral hemorrhage affecting right dominant side: Secondary | ICD-10-CM | POA: Diagnosis not present

## 2015-11-08 DIAGNOSIS — S72141D Displaced intertrochanteric fracture of right femur, subsequent encounter for closed fracture with routine healing: Secondary | ICD-10-CM | POA: Diagnosis not present

## 2015-11-11 DIAGNOSIS — I69151 Hemiplegia and hemiparesis following nontraumatic intracerebral hemorrhage affecting right dominant side: Secondary | ICD-10-CM | POA: Diagnosis not present

## 2015-11-11 DIAGNOSIS — S72141D Displaced intertrochanteric fracture of right femur, subsequent encounter for closed fracture with routine healing: Secondary | ICD-10-CM | POA: Diagnosis not present

## 2015-11-12 ENCOUNTER — Ambulatory Visit: Payer: BC Managed Care – PPO | Admitting: Neurology

## 2015-11-12 DIAGNOSIS — S72141D Displaced intertrochanteric fracture of right femur, subsequent encounter for closed fracture with routine healing: Secondary | ICD-10-CM | POA: Diagnosis not present

## 2015-11-12 DIAGNOSIS — I69151 Hemiplegia and hemiparesis following nontraumatic intracerebral hemorrhage affecting right dominant side: Secondary | ICD-10-CM | POA: Diagnosis not present

## 2015-11-14 ENCOUNTER — Emergency Department (HOSPITAL_COMMUNITY)
Admission: EM | Admit: 2015-11-14 | Discharge: 2015-11-15 | Disposition: A | Discharge status: Home / Self Care | Payer: Medicare Other | Attending: Emergency Medicine | Admitting: Emergency Medicine

## 2015-11-14 DIAGNOSIS — E785 Hyperlipidemia, unspecified: Secondary | ICD-10-CM | POA: Diagnosis not present

## 2015-11-14 DIAGNOSIS — E119 Type 2 diabetes mellitus without complications: Secondary | ICD-10-CM | POA: Insufficient documentation

## 2015-11-14 DIAGNOSIS — I2510 Atherosclerotic heart disease of native coronary artery without angina pectoris: Secondary | ICD-10-CM | POA: Diagnosis not present

## 2015-11-14 DIAGNOSIS — Q211 Atrial septal defect: Secondary | ICD-10-CM | POA: Diagnosis not present

## 2015-11-14 DIAGNOSIS — I509 Heart failure, unspecified: Secondary | ICD-10-CM | POA: Insufficient documentation

## 2015-11-14 DIAGNOSIS — Z794 Long term (current) use of insulin: Secondary | ICD-10-CM | POA: Insufficient documentation

## 2015-11-14 DIAGNOSIS — N390 Urinary tract infection, site not specified: Secondary | ICD-10-CM | POA: Insufficient documentation

## 2015-11-14 DIAGNOSIS — Z7901 Long term (current) use of anticoagulants: Secondary | ICD-10-CM | POA: Insufficient documentation

## 2015-11-14 DIAGNOSIS — Z8673 Personal history of transient ischemic attack (TIA), and cerebral infarction without residual deficits: Secondary | ICD-10-CM | POA: Insufficient documentation

## 2015-11-14 DIAGNOSIS — Z8781 Personal history of (healed) traumatic fracture: Secondary | ICD-10-CM | POA: Insufficient documentation

## 2015-11-14 DIAGNOSIS — F17210 Nicotine dependence, cigarettes, uncomplicated: Secondary | ICD-10-CM | POA: Insufficient documentation

## 2015-11-14 DIAGNOSIS — R791 Abnormal coagulation profile: Secondary | ICD-10-CM | POA: Diagnosis not present

## 2015-11-14 DIAGNOSIS — Z9889 Other specified postprocedural states: Secondary | ICD-10-CM | POA: Insufficient documentation

## 2015-11-14 DIAGNOSIS — Z79899 Other long term (current) drug therapy: Secondary | ICD-10-CM | POA: Insufficient documentation

## 2015-11-14 DIAGNOSIS — R319 Hematuria, unspecified: Secondary | ICD-10-CM | POA: Diagnosis present

## 2015-11-14 DIAGNOSIS — F329 Major depressive disorder, single episode, unspecified: Secondary | ICD-10-CM | POA: Insufficient documentation

## 2015-11-14 DIAGNOSIS — Z862 Personal history of diseases of the blood and blood-forming organs and certain disorders involving the immune mechanism: Secondary | ICD-10-CM | POA: Diagnosis not present

## 2015-11-14 DIAGNOSIS — Z86018 Personal history of other benign neoplasm: Secondary | ICD-10-CM | POA: Diagnosis not present

## 2015-11-14 DIAGNOSIS — S72141D Displaced intertrochanteric fracture of right femur, subsequent encounter for closed fracture with routine healing: Secondary | ICD-10-CM | POA: Diagnosis not present

## 2015-11-14 DIAGNOSIS — I10 Essential (primary) hypertension: Secondary | ICD-10-CM | POA: Diagnosis not present

## 2015-11-14 DIAGNOSIS — I69151 Hemiplegia and hemiparesis following nontraumatic intracerebral hemorrhage affecting right dominant side: Secondary | ICD-10-CM | POA: Diagnosis not present

## 2015-11-14 LAB — POC OCCULT BLOOD, ED: Fecal Occult Bld: NEGATIVE

## 2015-11-14 NOTE — ED Notes (Signed)
Tiffany PA at bedside

## 2015-11-14 NOTE — ED Provider Notes (Signed)
CSN: [id number]     Arrival date & time 11/14/15  [id number] History   First MD Initiated Contact with Patient 11/14/15 2227     Chief Complaint  Patient presents with  . Hematuria     (Consider location/radiation/quality/duration/timing/severity/associated sxs/prior Treatment) HPI   Pt to ER with PMH of CHF, diabetes, stroke with right side deficits, hx of GI bleed, sacral wound comes to the ER for hematuria. She is brought in by her son, she lives at home and is cared for by a caregiver. They noticed the blood since Monday when she urinates, a couple drops in the toilet and then on the tissue. They are not 100% sure if it is in her urine, vaginal, rectal or from her sacral wound. She is at baseline with no complaints or discomforts. She has had no fever, dysuria, abdominal pain, back pain, CP, N/V/D, dysphagia. PCP: Charolette Forward, MD COYE KASSAM is a 70 y.o.  female    Past Medical History  Diagnosis Date  . CHF (congestive heart failure) (HCC)     EF 99991111, grade 1 diastolic dysfunction per echo 04/2015  . Diabetes ([place]) 2016    Type II. On insulin  . Hypertension   . CVA (cerebral infarction) 04/2015    Started Plavix 04/2015  . Patent foramen ovale 04/2015    Started on warfarin 04/2015  . Enchondroma of bone 2011    left femur.   . Stercoral ulcer of rectum 05/16/2015  . Fracture, intertrochanteric, right femur ([place])   . Acute blood loss anemia   . Protein calorie malnutrition ([place])   . Hyperlipidemia   . History of lower GI bleeding   . 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 Depression    Past Surgical History  Procedure Laterality Date  . Cardiac surgery      Cath without stent  . Tee without cardioversion N/A 05/05/2015    Procedure: TRANSESOPHAGEAL ECHOCARDIOGRAM (TEE);  Surgeon: Dixie Dials, MD;  Location: [facility] ENDOSCOPY;  Service: Cardiovascular;  Laterality: N/A;  . Colonoscopy N/A 05/15/2015     Procedure: COLONOSCOPY;  Surgeon: Mauri Pole, MD;  Location: [place] ENDOSCOPY;  Service: Endoscopy;  Laterality: N/A;  . Flexible sigmoidoscopy N/A 05/15/2015    Procedure: FLEXIBLE SIGMOIDOSCOPY;  Surgeon: Mauri Pole, MD;  Location: [place] ENDOSCOPY;  Service: Endoscopy;  Laterality: N/A;  at bedside  . Intramedullary (im) nail intertrochanteric Right 06/12/2015    Procedure: INTRAMEDULLARY (IM) NAIL RIGHT HIP;  Surgeon: Renette Butters, MD;  Location: [facility];  Service: Orthopedics;  Laterality: Right;   Family History  Problem Relation Age of Onset  . Hypertension Mother   . Heart failure Mother   . Hyperlipidemia Mother   . Heart attack Father    Social History  Substance Use Topics  . Smoking status: Current Every Day Smoker    Types: Cigarettes  . Smokeless tobacco: Not on file     Comment: Quit 04/28/15  . Alcohol Use: No   OB History    No data available     Review of Systems  Review of Systems All other systems negative except as documented in the HPI. All pertinent positives and negatives as reviewed in the HPI.   Allergies  Pork-derived products  Home Medications   Prior to Admission medications   Medication Sig Start Date End Date Taking? Authorizing Provider  atorvastatin (LIPITOR) 40 MG tablet Take 1 tablet (40 mg total) by mouth  daily. 05/06/15  Yes Charolette Forward, MD  baclofen (LIORESAL) 10 MG tablet Take 1 tablet (10 mg total) by mouth 3 (three) times daily. 10/28/15  Yes Marcial Pacas, MD  Ergocalciferol (VITAMIN D2) 400 units TABS Take 1 tablet by mouth daily.   Yes Historical Provider, MD  escitalopram (LEXAPRO) 10 MG tablet Take 10 mg by mouth daily as needed (anxiety).    Yes Historical Provider, MD  gabapentin (NEURONTIN) 300 MG capsule Take 1 capsule (300 mg total) by mouth 3 (three) times daily. 10/28/15  Yes Marcial Pacas, MD  insulin glargine (LANTUS) 100 UNIT/ML injection Inject 12 Units into the skin at bedtime.    Yes Historical Provider, MD   metoprolol succinate (TOPROL-XL) 25 MG 24 hr tablet Take 25 mg by mouth daily. Hold for SBP <110 or HR <60   Yes Historical Provider, MD  nitroGLYCERIN (NITROSTAT) 0.4 MG SL tablet Place 0.4 mg under the tongue every 5 (five) minutes as needed for chest pain (up to 3 doses.  If no relief call MD/NP).    Yes Historical Provider, MD  polyethylene glycol (MIRALAX / GLYCOLAX) packet Take 17 g by mouth daily. Patient taking differently: Take 17 g by mouth daily as needed for mild constipation.  05/21/15  Yes Charolette Forward, MD  traMADol (ULTRAM) 50 MG tablet Take 1 tablet (50 mg total) by mouth every 8 (eight) hours as needed for moderate pain or severe pain. 10/28/15  Yes Marcial Pacas, MD  warfarin (COUMADIN) 5 MG tablet Take 5 mg by mouth daily.    Yes Historical Provider, MD  cephALEXin (KEFLEX) 500 MG capsule Take 1 capsule (500 mg total) by mouth 4 (four) times daily. 11/15/15   Monetta Lick Carlota Raspberry, PA-C   BP 109/61 mmHg  Pulse 63  Temp(Src) 98.3 F (36.8 C) (Oral)  Resp 16  SpO2 100% Physical Exam  Constitutional: She is oriented to person, place, and time. She appears well-developed and well-nourished. No distress.  HENT:  Head: Normocephalic and atraumatic.  Eyes: Pupils are equal, round, and reactive to light.  Neck: Normal range of motion. Neck supple.  Cardiovascular: Normal rate and regular rhythm.   Pulmonary/Chest: Effort normal.  Abdominal: Soft. There is no tenderness. There is no rigidity, no rebound, no guarding and no CVA tenderness.  Genitourinary:  Wound to left buttock is well healed. No blood on rectal exam, normal colored stool on glove. No wounds or evidence of where bleeding is coming from on exam  Neurological: She is alert and oriented to person, place, and time.  Baseline weakness to right, pt has aphagia that is at baseline per family.  Skin: Skin is warm and dry.  Nursing note and vitals reviewed.   ED Course  Procedures (including critical care time) Labs  Review Labs Reviewed  CBC WITH DIFFERENTIAL/PLATELET - Abnormal; Notable for the following:    Lymphs Abs 4.1 (*)    All other components within normal limits  COMPREHENSIVE METABOLIC PANEL - Abnormal; Notable for the following:    Glucose, Bld 177 (*)    Total Protein 6.4 (*)    Albumin 3.4 (*)    All other components within normal limits  PROTIME-INR - Abnormal; Notable for the following:    Prothrombin Time 16.6 (*)    All other components within normal limits  URINALYSIS, ROUTINE W REFLEX MICROSCOPIC (NOT AT [facility]) - Abnormal; Notable for the following:    APPearance CLOUDY (*)    Hgb urine dipstick LARGE (*)    Leukocytes, UA  MODERATE (*)    All other components within normal limits  URINE MICROSCOPIC-ADD ON - Abnormal; Notable for the following:    Squamous Epithelial / LPF 0-5 (*)    Bacteria, UA MANY (*)    All other components within normal limits  URINE CULTURE  OCCULT BLOOD X 1 CARD TO LAB, STOOL  POC OCCULT BLOOD, ED  TYPE AND SCREEN    Imaging Review No results found. I have personally reviewed and evaluated these images and lab results as part of my medical decision-making.   EKG Interpretation None      MDM   Final diagnoses:  UTI (lower urinary tract infection)  Hematuria  Subtherapeutic international normalized ratio (INR)    Dr. Vanita Panda has seen patient as well. Agrees with work-up. Patients CBC, CMP and hemoccult are unremarkable. PT/INR is sub therapeutic, per Dr. Vanita Panda he recommends bridging with Lovenox and having her PT/INR rechecked on Monday. Urinalysis shows UTI, culture sent out- will cover with Keflex. Patient appears well for home and pt/family member request dc.  Medications  enoxaparin (LOVENOX) injection 60 mg (60 mg Subcutaneous Given 11/15/15 0225)  cephALEXin (KEFLEX) capsule 500 mg (500 mg Oral Given 11/15/15 0222)    I discussed results, diagnoses and plan with [name] They voice there understanding and questions  were answered. We discussed follow-up recommendations and return precautions.    Delos Haring, PA-C 11/15/15 0230  Carmin Muskrat, MD 11/15/15 [phone]

## 2015-11-14 NOTE — ED Notes (Signed)
Pt complaining of "blood in urine." Pt is taking coumadin. Family states, bleeding is noticed when wiping, no redness in stool.

## 2015-11-15 DIAGNOSIS — N390 Urinary tract infection, site not specified: Secondary | ICD-10-CM | POA: Diagnosis not present

## 2015-11-15 LAB — CBC WITH DIFFERENTIAL/PLATELET
BASOS PCT: 0 %
Basophils Absolute: 0 10*3/uL (ref 0.0–0.1)
Eosinophils Absolute: 0.3 10*3/uL (ref 0.0–0.7)
Eosinophils Relative: 3 %
HEMATOCRIT: 40.7 % (ref 36.0–46.0)
HEMOGLOBIN: 12.6 g/dL (ref 12.0–15.0)
LYMPHS ABS: 4.1 10*3/uL — AB (ref 0.7–4.0)
Lymphocytes Relative: 46 %
MCH: 27 pg (ref 26.0–34.0)
MCHC: 31 g/dL (ref 30.0–36.0)
MCV: 87.2 fL (ref 78.0–100.0)
MONOS PCT: 6 %
Monocytes Absolute: 0.6 10*3/uL (ref 0.1–1.0)
NEUTROS ABS: 4 10*3/uL (ref 1.7–7.7)
NEUTROS PCT: 45 %
Platelets: 214 10*3/uL (ref 150–400)
RBC: 4.67 MIL/uL (ref 3.87–5.11)
RDW: 14.4 % (ref 11.5–15.5)
WBC: 9 10*3/uL (ref 4.0–10.5)

## 2015-11-15 LAB — TYPE AND SCREEN
ABO/RH(D): A POS
ANTIBODY SCREEN: NEGATIVE

## 2015-11-15 LAB — COMPREHENSIVE METABOLIC PANEL
ALBUMIN: 3.4 g/dL — AB (ref 3.5–5.0)
ALK PHOS: 88 U/L (ref 38–126)
ALT: 45 U/L (ref 14–54)
ANION GAP: 11 (ref 5–15)
AST: 28 U/L (ref 15–41)
BILIRUBIN TOTAL: 0.5 mg/dL (ref 0.3–1.2)
BUN: 20 mg/dL (ref 6–20)
CO2: 25 mmol/L (ref 22–32)
Calcium: 9.6 mg/dL (ref 8.9–10.3)
Chloride: 104 mmol/L (ref 101–111)
Creatinine, Ser: 0.88 mg/dL (ref 0.44–1.00)
GFR calc Af Amer: >60 mL/min (ref >60)
GFR calc non Af Amer: >60 mL/min (ref >60)
GLUCOSE: 177 mg/dL — AB (ref 65–99)
Potassium: 4.2 mmol/L (ref 3.5–5.1)
Sodium: 140 mmol/L (ref 135–145)
Total Protein: 6.4 g/dL — ABNORMAL LOW (ref 6.5–8.1)

## 2015-11-15 LAB — URINALYSIS, ROUTINE W REFLEX MICROSCOPIC
Bilirubin Urine: NEGATIVE
Glucose, UA: NEGATIVE mg/dL
KETONES UR: NEGATIVE mg/dL
NITRITE: NEGATIVE
PROTEIN: NEGATIVE mg/dL
Specific Gravity, Urine: 1.013 (ref 1.005–1.030)
pH: 5.5 (ref 5.0–8.0)

## 2015-11-15 LAB — URINE MICROSCOPIC-ADD ON

## 2015-11-15 LAB — PROTIME-INR
INR: 1.33 (ref 0.00–1.49)
Prothrombin Time: 16.6 seconds — ABNORMAL HIGH (ref 11.6–15.2)

## 2015-11-15 MED ORDER — CEPHALEXIN 250 MG PO CAPS
500.0000 mg | ORAL_CAPSULE | Freq: Once | ORAL | Status: AC
  Administered 2015-11-15: 500 mg via ORAL
  Filled 2015-11-15: qty 2

## 2015-11-15 MED ORDER — ENOXAPARIN SODIUM 60 MG/0.6ML ~~LOC~~ SOLN
60.0000 mg | Freq: Once | SUBCUTANEOUS | Status: AC
  Administered 2015-11-15: 60 mg via SUBCUTANEOUS
  Filled 2015-11-15: qty 0.6

## 2015-11-15 MED ORDER — CEPHALEXIN 500 MG PO CAPS: 500.0000 mg | ORAL_CAPSULE | Freq: Four times a day (QID) | ORAL | Status: DC

## 2015-11-15 NOTE — ED Notes (Signed)
Pt departed in NAD.  

## 2015-11-15 NOTE — ED Notes (Signed)
Pt given water per Tiffany G PA.

## 2015-11-15 NOTE — Discharge Instructions (Signed)
Hematuria, Adult °Hematuria is blood in your urine. It can be caused by a bladder infection, kidney infection, prostate infection, kidney stone, or cancer of your urinary tract. Infections can usually be treated with medicine, and a kidney stone usually will pass through your urine. If neither of these is the cause of your hematuria, further workup to find out the reason may be needed. °It is very important that you tell your health care provider about any blood you see in your urine, even if the blood stops without treatment or happens without causing pain. Blood in your urine that happens and then stops and then happens again can be a symptom of a very serious condition. Also, pain is not a symptom in the initial stages of many urinary cancers. °HOME CARE INSTRUCTIONS  °· Drink lots of fluid, 3-4 quarts a day. If you have been diagnosed with an infection, cranberry juice is especially recommended, in addition to large amounts of water. °· Avoid caffeine, tea, and carbonated beverages because they tend to irritate the bladder. °· Avoid alcohol because it may irritate the prostate. °· Take all medicines as directed by your health care provider. °· If you were prescribed an antibiotic medicine, finish it all even if you start to feel better. °· If you have been diagnosed with a kidney stone, follow your health care provider's instructions regarding straining your urine to catch the stone. °· Empty your bladder often. Avoid holding urine for long periods of time. °· After a bowel movement, women should cleanse front to back. Use each tissue only once. °· Empty your bladder before and after sexual intercourse if you are a female. °SEEK MEDICAL CARE IF: °· You develop back pain. °· You have a fever. °· You have a feeling of sickness in your stomach (nausea) or vomiting. °· Your symptoms are not better in 3 days. Return sooner if you are getting worse. °SEEK IMMEDIATE MEDICAL CARE IF:  °· You develop severe vomiting and  are unable to keep the medicine down. °· You develop severe back or abdominal pain despite taking your medicines. °· You begin passing a large amount of blood or clots in your urine. °· You feel extremely weak or faint, or you pass out. °MAKE SURE YOU:  °· Understand these instructions. °· Will watch your condition. °· Will get help right away if you are not doing well or get worse. °  °This information is not intended to replace advice given to you by your health care provider. Make sure you discuss any questions you have with your health care provider. °  °Document Released: 06/21/2005 Document Revised: 07/12/2014 Document Reviewed: 02/19/2013 °Elsevier Interactive Patient Education ©2016 Elsevier Inc. ° °Urinary Tract Infection °Urinary tract infections (UTIs) can develop anywhere along your urinary tract. Your urinary tract is your body's drainage system for removing wastes and extra water. Your urinary tract includes two kidneys, two ureters, a bladder, and a urethra. Your kidneys are a pair of bean-shaped organs. Each kidney is about the size of your fist. They are located below your ribs, one on each side of your spine. °CAUSES °Infections are caused by microbes, which are microscopic organisms, including fungi, viruses, and bacteria. These organisms are so small that they can only be seen through a microscope. Bacteria are the microbes that most commonly cause UTIs. °SYMPTOMS  °Symptoms of UTIs may vary by age and gender of the patient and by the location of the infection. Symptoms in young women typically include a frequent   and intense urge to urinate and a painful, burning feeling in the bladder or urethra during urination. Older women and men are more likely to be tired, shaky, and weak and have muscle aches and abdominal pain. A fever may mean the infection is in your kidneys. Other symptoms of a kidney infection include pain in your back or sides below the ribs, nausea, and vomiting. °DIAGNOSIS °To  diagnose a UTI, your caregiver will ask you about your symptoms. Your caregiver will also ask you to provide a urine sample. The urine sample will be tested for bacteria and white blood cells. White blood cells are made by your body to help fight infection. °TREATMENT  °Typically, UTIs can be treated with medication. Because most UTIs are caused by a bacterial infection, they usually can be treated with the use of antibiotics. The choice of antibiotic and length of treatment depend on your symptoms and the type of bacteria causing your infection. °HOME CARE INSTRUCTIONS °· If you were prescribed antibiotics, take them exactly as your caregiver instructs you. Finish the medication even if you feel better after you have only taken some of the medication. °· Drink enough water and fluids to keep your urine clear or pale yellow. °· Avoid caffeine, tea, and carbonated beverages. They tend to irritate your bladder. °· Empty your bladder often. Avoid holding urine for long periods of time. °· Empty your bladder before and after sexual intercourse. °· After a bowel movement, women should cleanse from front to back. Use each tissue only once. °SEEK MEDICAL CARE IF:  °· You have back pain. °· You develop a fever. °· Your symptoms do not begin to resolve within 3 days. °SEEK IMMEDIATE MEDICAL CARE IF:  °· You have severe back pain or lower abdominal pain. °· You develop chills. °· You have nausea or vomiting. °· You have continued burning or discomfort with urination. °MAKE SURE YOU:  °· Understand these instructions. °· Will watch your condition. °· Will get help right away if you are not doing well or get worse. °  °This information is not intended to replace advice given to you by your health care provider. Make sure you discuss any questions you have with your health care provider. °  °Document Released: 03/31/2005 Document Revised: 03/12/2015 Document Reviewed: 07/30/2011 °Elsevier Interactive Patient Education ©2016  Elsevier Inc. ° °

## 2015-11-18 LAB — URINE CULTURE

## 2015-11-19 ENCOUNTER — Telehealth: Payer: Self-pay | Admitting: *Deleted

## 2015-11-19 DIAGNOSIS — S72141D Displaced intertrochanteric fracture of right femur, subsequent encounter for closed fracture with routine healing: Secondary | ICD-10-CM | POA: Diagnosis not present

## 2015-11-19 DIAGNOSIS — I69151 Hemiplegia and hemiparesis following nontraumatic intracerebral hemorrhage affecting right dominant side: Secondary | ICD-10-CM | POA: Diagnosis not present

## 2015-11-19 NOTE — Progress Notes (Signed)
ED Antimicrobial Stewardship Positive Culture Follow Up   [name] is an 70 y.o. female who presented to [facility] on 11/14/2015 with a chief complaint of  Chief Complaint  Patient presents with  . Hematuria    Recent Results (from the past 720 hour(s))  Urine culture     Status: Abnormal   Collection Time: 11/15/15  1:29 AM  Result Value Ref Range Status   Specimen Description URINE, CLEAN CATCH  Final   Special Requests NONE  Final   Culture (A)  Final    >=100,000 COLONIES/mL KLEBSIELLA PNEUMONIAE Confirmed Extended Spectrum Beta-Lactamase Producer (ESBL)    Report Status 11/18/2015 FINAL  Final   Organism ID, Bacteria KLEBSIELLA PNEUMONIAE (A)  Final      Susceptibility   Klebsiella pneumoniae - MIC*    AMPICILLIN >=32 RESISTANT Resistant     CEFAZOLIN >=64 RESISTANT Resistant     CEFTRIAXONE >=64 RESISTANT Resistant     CIPROFLOXACIN 0.5 SENSITIVE Sensitive     GENTAMICIN <=1 SENSITIVE Sensitive     IMIPENEM <=0.25 SENSITIVE Sensitive     NITROFURANTOIN 64 INTERMEDIATE Intermediate     TRIMETH/SULFA >=320 RESISTANT Resistant     AMPICILLIN/SULBACTAM 16 INTERMEDIATE Intermediate     PIP/TAZO <=4 SENSITIVE Sensitive     * >=100,000 COLONIES/mL KLEBSIELLA PNEUMONIAE     No urinary symptoms. UA negative. No treatment indicated.  ED Provider: Domenic Moras, PA-C  [name] L. Nicole Kindred, PharmD PGY2 Infectious Diseases Pharmacy Resident Pager: [phone] 11/19/2015 9:23 AM

## 2015-11-19 NOTE — ED Notes (Unsigned)
Post ED Visit - Positive Culture Follow-up: Successful Patient Follow-Up  Culture assessed and recommendations reviewed by: []  Elenor Quinones, Pharm.D. []  Heide Guile, Pharm.D., BCPS []  Parks Neptune, Pharm.D. []  Alycia Rossetti, Pharm.D., BCPS []  [place], Pharm.D., BCPS, AAHIVP []  Legrand Como, Pharm.D., BCPS, AAHIVP [x]  Milus Glazier, Pharm.D. []  Stephens November, Pharm.D.  Positive urine culture, no urinary symptoms, no treatment, Domenic Moras, PA-C  []  Patient discharged without antimicrobial prescription and treatment is now indicated []  Organism is resistant to prescribed ED discharge antimicrobial []  Patient with positive blood cultures  Changes discussed with ED provider: Domenic Moras, PA-C New antibiotic prescription *** Called to ***  Contacted patient, date 11/19/2015   [name] 11/19/2015, 10:25 AM

## 2015-11-22 DIAGNOSIS — I69151 Hemiplegia and hemiparesis following nontraumatic intracerebral hemorrhage affecting right dominant side: Secondary | ICD-10-CM | POA: Diagnosis not present

## 2015-11-22 DIAGNOSIS — S72141D Displaced intertrochanteric fracture of right femur, subsequent encounter for closed fracture with routine healing: Secondary | ICD-10-CM | POA: Diagnosis not present

## 2015-11-25 ENCOUNTER — Ambulatory Visit (INDEPENDENT_AMBULATORY_CARE_PROVIDER_SITE_OTHER): Payer: Medicare Other

## 2015-11-25 DIAGNOSIS — R410 Disorientation, unspecified: Secondary | ICD-10-CM

## 2015-11-25 DIAGNOSIS — F05 Delirium due to known physiological condition: Secondary | ICD-10-CM

## 2015-11-25 DIAGNOSIS — Z8673 Personal history of transient ischemic attack (TIA), and cerebral infarction without residual deficits: Secondary | ICD-10-CM

## 2015-11-25 DIAGNOSIS — M25511 Pain in right shoulder: Secondary | ICD-10-CM

## 2015-11-25 DIAGNOSIS — G8111 Spastic hemiplegia affecting right dominant side: Secondary | ICD-10-CM

## 2015-11-26 DIAGNOSIS — I69151 Hemiplegia and hemiparesis following nontraumatic intracerebral hemorrhage affecting right dominant side: Secondary | ICD-10-CM | POA: Diagnosis not present

## 2015-11-26 DIAGNOSIS — S72141D Displaced intertrochanteric fracture of right femur, subsequent encounter for closed fracture with routine healing: Secondary | ICD-10-CM | POA: Diagnosis not present

## 2015-11-27 NOTE — Procedures (Signed)
   HISTORY: 70 years old female with history of stroke, presenting with slurred speech, confusion  TECHNIQUE:  16 channel EEG was performed based on standard 10-16 international system. One channel was dedicated to EKG, which has demonstrates normal sinus rhythm of 72 beats per minutes.  Upon awakening, the posterior background activity was well-developed, in theta range,   reactive to eye opening and closure.  There was no evidence of epileptiform discharge.  Photic stimulation and hyperventilation was not performed  No sleep was achieved.  CONCLUSION: This is an abnormal EEG.  There is evidence of mild background slowing, indicating bi-cerebral dysfunction, common etiology is metabolic toxic.

## 2015-11-28 DIAGNOSIS — I69151 Hemiplegia and hemiparesis following nontraumatic intracerebral hemorrhage affecting right dominant side: Secondary | ICD-10-CM | POA: Diagnosis not present

## 2015-11-28 DIAGNOSIS — S72141D Displaced intertrochanteric fracture of right femur, subsequent encounter for closed fracture with routine healing: Secondary | ICD-10-CM | POA: Diagnosis not present

## 2015-11-29 DIAGNOSIS — I69151 Hemiplegia and hemiparesis following nontraumatic intracerebral hemorrhage affecting right dominant side: Secondary | ICD-10-CM | POA: Diagnosis not present

## 2015-11-29 DIAGNOSIS — S72141D Displaced intertrochanteric fracture of right femur, subsequent encounter for closed fracture with routine healing: Secondary | ICD-10-CM | POA: Diagnosis not present

## 2015-12-01 DIAGNOSIS — I69151 Hemiplegia and hemiparesis following nontraumatic intracerebral hemorrhage affecting right dominant side: Secondary | ICD-10-CM | POA: Diagnosis not present

## 2015-12-01 DIAGNOSIS — S72141D Displaced intertrochanteric fracture of right femur, subsequent encounter for closed fracture with routine healing: Secondary | ICD-10-CM | POA: Diagnosis not present

## 2015-12-03 ENCOUNTER — Encounter: Payer: Self-pay | Admitting: Podiatry

## 2015-12-03 ENCOUNTER — Ambulatory Visit (INDEPENDENT_AMBULATORY_CARE_PROVIDER_SITE_OTHER): Payer: Medicare Other | Admitting: Podiatry

## 2015-12-03 VITALS — BP 161/80 | HR 92 | Resp 12

## 2015-12-03 DIAGNOSIS — I2510 Atherosclerotic heart disease of native coronary artery without angina pectoris: Secondary | ICD-10-CM

## 2015-12-03 DIAGNOSIS — M79675 Pain in left toe(s): Secondary | ICD-10-CM | POA: Diagnosis not present

## 2015-12-03 DIAGNOSIS — B351 Tinea unguium: Secondary | ICD-10-CM

## 2015-12-03 DIAGNOSIS — M79674 Pain in right toe(s): Secondary | ICD-10-CM | POA: Diagnosis not present

## 2015-12-03 NOTE — Patient Instructions (Signed)
Diabetes and Foot Care Diabetes may cause you to have problems because of poor blood supply (circulation) to your feet and legs. This may cause the skin on your feet to become thinner, break easier, and heal more slowly. Your skin may become dry, and the skin may peel and crack. You may also have nerve damage in your legs and feet causing decreased feeling in them. You may not notice minor injuries to your feet that could lead to infections or more serious problems. Taking care of your feet is one of the most important things you can do for yourself.  HOME CARE INSTRUCTIONS  Wear shoes at all times, even in the house. Do not go barefoot. Bare feet are easily injured.  Check your feet daily for blisters, cuts, and redness. If you cannot see the bottom of your feet, use a mirror or ask someone for help.  Wash your feet with warm water (do not use hot water) and mild soap. Then pat your feet and the areas between your toes until they are completely dry. Do not soak your feet as this can dry your skin.  Apply a moisturizing lotion or petroleum jelly (that does not contain alcohol and is unscented) to the skin on your feet and to dry, brittle toenails. Do not apply lotion between your toes.  Trim your toenails straight across. Do not dig under them or around the cuticle. File the edges of your nails with an emery board or nail file.  Do not cut corns or calluses or try to remove them with medicine.  Wear clean socks or stockings every day. Make sure they are not too tight. Do not wear knee-high stockings since they may decrease blood flow to your legs.  Wear shoes that fit properly and have enough cushioning. To break in new shoes, wear them for just a few hours a day. This prevents you from injuring your feet. Always look in your shoes before you put them on to be sure there are no objects inside.  Do not cross your legs. This may decrease the blood flow to your feet.  If you find a minor scrape,  cut, or break in the skin on your feet, keep it and the skin around it clean and dry. These areas may be cleansed with mild soap and water. Do not cleanse the area with peroxide, alcohol, or iodine.  When you remove an adhesive bandage, be sure not to damage the skin around it.  If you have a wound, look at it several times a day to make sure it is healing.  Do not use heating pads or hot water bottles. They may burn your skin. If you have lost feeling in your feet or legs, you may not know it is happening until it is too late.  Make sure your health care provider performs a complete foot exam at least annually or more often if you have foot problems. Report any cuts, sores, or bruises to your health care provider immediately. SEEK MEDICAL CARE IF:   You have an injury that is not healing.  You have cuts or breaks in the skin.  You have an ingrown nail.  You notice redness on your legs or feet.  You feel burning or tingling in your legs or feet.  You have pain or cramps in your legs and feet.  Your legs or feet are numb.  Your feet always feel cold. SEEK IMMEDIATE MEDICAL CARE IF:   There is increasing redness,   swelling, or pain in or around a wound.  There is a red line that goes up your leg.  Pus is coming from a wound.  You develop a fever or as directed by your health care provider.  You notice a bad smell coming from an ulcer or wound.   This information is not intended to replace advice given to you by your health care provider. Make sure you discuss any questions you have with your health care provider.   Document Released: 06/18/2000 Document Revised: 02/21/2013 Document Reviewed: 11/28/2012 Elsevier Interactive Patient Education 2016 Elsevier Inc.  

## 2015-12-03 NOTE — Progress Notes (Signed)
   Subjective:    Patient ID: [name], female    DOB: 03-Jul-1946, 70 y.o.   MRN: [id number]  HPI    This patient presents today with her son present in the treatment room who is requesting debridement of his mother's thickened and elongated toenails which have gradually become more deformed and more difficult to trim over the past 6 months. Because patient has history of CVA with right-sided him I paresis and specificity and speech difficulty patient's son is helping mother respond to questioning. Also, patient's son request a diabetic shoes to accommodate to the peripheral edema in his mother's feet. He said the edema has been rather persistent and at today's visit this is the general appearance of his mother's feet in regards to the edema. There is a history of ulceration on the second right toe which was treated successfully with local wound care.  Patient is diabetic with history of stroke. History of wound second right toe with resolution with local wound care. Denial of amputation or claudication  Review of Systems  Skin: Positive for color change.       Objective:   Physical Exam  Patient appears orientated, however has difficulty with speech Patient able to transfer from wheelchair to treatment table  Vascular: Bilateral peripheral pitting edema DPs 1/4 bilaterally PT right trace palpable PT left 1/4 Capillary reflex within normal limits Chart review: Arterial Doppler dated 08/12/2015 normal ABIs bilaterally Abnormal great toe brachial indices bilaterally  Neurological: Sensation to 10 g monofilament wire intact 0/5 right and 5/5 left Vibratory sensation nonreactive right reactive left Ankle reflexes equal and reactive bilaterally  Dermatological: No open skin lesions bilaterally Absent second right toenail The remaining toenails 9 are elongated, brittle, deformed and tender to direct palpation  Musculoskeletal: Dorsi flexion and plantar flexion 5/5 right  and 0/5 left Hammertoe second left       Assessment & Plan:   Assessment: Diabetic with sensory and motor neuropathy Diabetic peripheral vascular disease Symptomatic onychomycoses 6-10 Hammertoe second left Peripheral edema bilaterally  Plan: Debridement toenails 6-10 mechanically and electronically without any bleeding Will submit certification form for diabetic shoes and notify patient upon receipt of certification  Reappoint 3 months for toenail debridement

## 2015-12-06 DIAGNOSIS — I69151 Hemiplegia and hemiparesis following nontraumatic intracerebral hemorrhage affecting right dominant side: Secondary | ICD-10-CM | POA: Diagnosis not present

## 2015-12-06 DIAGNOSIS — S72141D Displaced intertrochanteric fracture of right femur, subsequent encounter for closed fracture with routine healing: Secondary | ICD-10-CM | POA: Diagnosis not present

## 2015-12-08 ENCOUNTER — Ambulatory Visit: Payer: Medicare Other | Admitting: Dietician

## 2015-12-08 NOTE — Progress Notes (Signed)
Brief Nutrition note Patient arrived with her son.  She has recently had a stroke and cannot talk.  She is in a wheel chair.  She vomited here and will reschedule.  She has been having nausea and vomiting since yesterday afternoon.  They will reschedule.  Sick day rules briefly discussed.  There is no charge for this visit.  Antonieta Iba, RD, LDN

## 2015-12-09 DIAGNOSIS — S72141D Displaced intertrochanteric fracture of right femur, subsequent encounter for closed fracture with routine healing: Secondary | ICD-10-CM | POA: Diagnosis not present

## 2015-12-09 DIAGNOSIS — I69151 Hemiplegia and hemiparesis following nontraumatic intracerebral hemorrhage affecting right dominant side: Secondary | ICD-10-CM | POA: Diagnosis not present

## 2015-12-10 DIAGNOSIS — I69151 Hemiplegia and hemiparesis following nontraumatic intracerebral hemorrhage affecting right dominant side: Secondary | ICD-10-CM | POA: Diagnosis not present

## 2015-12-10 DIAGNOSIS — S72141D Displaced intertrochanteric fracture of right femur, subsequent encounter for closed fracture with routine healing: Secondary | ICD-10-CM | POA: Diagnosis not present

## 2015-12-11 DIAGNOSIS — S72141D Displaced intertrochanteric fracture of right femur, subsequent encounter for closed fracture with routine healing: Secondary | ICD-10-CM | POA: Diagnosis not present

## 2015-12-11 DIAGNOSIS — I69151 Hemiplegia and hemiparesis following nontraumatic intracerebral hemorrhage affecting right dominant side: Secondary | ICD-10-CM | POA: Diagnosis not present

## 2015-12-17 DIAGNOSIS — I69151 Hemiplegia and hemiparesis following nontraumatic intracerebral hemorrhage affecting right dominant side: Secondary | ICD-10-CM | POA: Diagnosis not present

## 2015-12-17 DIAGNOSIS — S72141D Displaced intertrochanteric fracture of right femur, subsequent encounter for closed fracture with routine healing: Secondary | ICD-10-CM | POA: Diagnosis not present

## 2015-12-22 ENCOUNTER — Ambulatory Visit: Payer: Medicare Other | Admitting: Family

## 2015-12-22 DIAGNOSIS — Z0289 Encounter for other administrative examinations: Secondary | ICD-10-CM

## 2015-12-23 ENCOUNTER — Ambulatory Visit (INDEPENDENT_AMBULATORY_CARE_PROVIDER_SITE_OTHER): Payer: Medicare Other | Admitting: Family

## 2015-12-23 ENCOUNTER — Other Ambulatory Visit (INDEPENDENT_AMBULATORY_CARE_PROVIDER_SITE_OTHER): Payer: Medicare Other

## 2015-12-23 ENCOUNTER — Encounter: Payer: Self-pay | Admitting: Family

## 2015-12-23 VITALS — BP 120/86 | HR 63 | Temp 98.2°F | Resp 16

## 2015-12-23 DIAGNOSIS — I10 Essential (primary) hypertension: Secondary | ICD-10-CM

## 2015-12-23 DIAGNOSIS — I69151 Hemiplegia and hemiparesis following nontraumatic intracerebral hemorrhage affecting right dominant side: Secondary | ICD-10-CM | POA: Diagnosis not present

## 2015-12-23 DIAGNOSIS — E118 Type 2 diabetes mellitus with unspecified complications: Secondary | ICD-10-CM

## 2015-12-23 DIAGNOSIS — I639 Cerebral infarction, unspecified: Secondary | ICD-10-CM | POA: Diagnosis not present

## 2015-12-23 DIAGNOSIS — S72141D Displaced intertrochanteric fracture of right femur, subsequent encounter for closed fracture with routine healing: Secondary | ICD-10-CM | POA: Diagnosis not present

## 2015-12-23 DIAGNOSIS — R600 Localized edema: Secondary | ICD-10-CM | POA: Insufficient documentation

## 2015-12-23 DIAGNOSIS — I2510 Atherosclerotic heart disease of native coronary artery without angina pectoris: Secondary | ICD-10-CM

## 2015-12-23 DIAGNOSIS — I5032 Chronic diastolic (congestive) heart failure: Secondary | ICD-10-CM | POA: Diagnosis not present

## 2015-12-23 LAB — COMPREHENSIVE METABOLIC PANEL
ALK PHOS: 84 U/L (ref 39–117)
ALT: 45 U/L — ABNORMAL HIGH (ref 0–35)
AST: 30 U/L (ref 0–37)
Albumin: 3.8 g/dL (ref 3.5–5.2)
BUN: 13 mg/dL (ref 6–23)
CHLORIDE: 104 meq/L (ref 96–112)
CO2: 31 mEq/L (ref 19–32)
Calcium: 9.3 mg/dL (ref 8.4–10.5)
Creatinine, Ser: 0.74 mg/dL (ref 0.40–1.20)
GFR: 99.39 mL/min (ref >60.00)
GLUCOSE: 161 mg/dL — AB (ref 70–99)
POTASSIUM: 3.9 meq/L (ref 3.5–5.1)
SODIUM: 140 meq/L (ref 135–145)
Total Bilirubin: 0.3 mg/dL (ref 0.2–1.2)
Total Protein: 6.5 g/dL (ref 6.0–8.3)

## 2015-12-23 LAB — HEMOGLOBIN A1C: HEMOGLOBIN A1C: 8.4 % — AB (ref 4.6–6.5)

## 2015-12-23 NOTE — Assessment & Plan Note (Signed)
Right-sided edema worse on left with left being scant. Most likely related to spastic hemiparesis. Treat conservatively with elevation, decreased sodium in diet, and compression socks as needed. Diuresis held currently secondary to lower blood pressure with concern for lowering blood pressure which may result in stroke. Continue to monitor and follow-up if symptoms worsen or do not improve.

## 2015-12-23 NOTE — Assessment & Plan Note (Signed)
Type 2 diabetes currently maintained on Lantus and metformin with gastrointestinal distress experience with metformin. Most previous A1c is 7.6. Diabetic foot exam completed by podiatry and 12/03/15. Maintained on atorvastatin for CAD risk reduction. Declines Pneumovax. Continue to monitor blood sugars at home. Continue current dosage of Lantus pending A1c results. Hold metformin pending A1c results. Refer to ophthalmology for diabetic eye exam.

## 2015-12-23 NOTE — Assessment & Plan Note (Signed)
Appears stable and improving with right-sided spastic hemiparesis. He continues to work with physical therapy to improve motion. Does have gross motor movements of the lower extremity. Continue with risk factor reduction to prevent future strokes including control of hypertension, diabetes, and hyperlipidemia. All currently maintained on medications. Blood pressure is within goal. Continue to monitor.

## 2015-12-23 NOTE — Progress Notes (Signed)
Subjective:    Patient ID: [name], female    DOB: [birth year], 70 y.o.   MRN: [id number]  Chief Complaint  Patient presents with  . Establish Care    pt is diabetic and on metformin, unsure of the dose that she was given, does not take it much bc it upsets her stomach, has constant daily swelling in both feet, need diabetic shoes    HPI:  [name] is a 70 y.o. 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 and presents today for an office visit to establish care.  1.) Type 2 diabetes - currently maintained on Lantus and metformin. Reports that she has not taken the metformin on a regular basis secondary to it upsetting her stomach. Blood sugars at home have been averaging 100-140 at varies times . Denies new symptoms of end organ damage.   Lab Results  Component Value Date   HGBA1C 7.3* 06/11/2015   2.) Swelling of feet - Associated symptom of edema located in her bilateral feet has been going on for about 10 months starting with her stroke. Modifying factors include elevating her legs does improve her the edema which is worse towards the end of evening. Right foot is worse than the left. Swelling generally increases over the course of the day.   3.) Hypertension - currently maintained on metoprolol. Reports taking medication as prescribed and denies adverse side effects.  4.) Congestive Heart Failure - most recent TEE echocardiogram in October 2016 with estimated ejection fraction of 50-55% with normal wall  motion and no regional wall motion abnormalities. She was noted to have a patent foramen ovale. Currently maintained on warfarin for anticoagulation.  Allergies  Allergen Reactions  . Pork-Derived Products Other (See Comments)    To keep blood pressure down     Outpatient Prescriptions Prior to Visit  Medication Sig Dispense Refill  . atorvastatin (LIPITOR) 40 MG tablet Take 1 tablet (40 mg total) by mouth daily. 30 tablet 3  . baclofen (LIORESAL) 10 MG tablet Take 1 tablet (10 mg total) by mouth 3 (three) times daily. 90 tablet 11  . Ergocalciferol (VITAMIN D2) 400 units TABS Take 1 tablet by mouth daily.    Marland Kitchen escitalopram (LEXAPRO) 10 MG tablet Take 10 mg by mouth daily as needed (anxiety).     . gabapentin (NEURONTIN) 300 MG capsule Take 1 capsule (300 mg total) by mouth 3 (three) times daily. 90 capsule 11  . insulin glargine (LANTUS) 100 UNIT/ML injection Inject 12 Units into the skin at bedtime.     . metoprolol succinate (TOPROL-XL) 25 MG 24 hr tablet Take 25 mg by mouth daily. Hold for SBP <110 or HR <60    . nitroGLYCERIN (NITROSTAT) 0.4 MG SL tablet Place 0.4 mg under the tongue every 5 (five) minutes as needed for chest pain (up to 3 doses.  If no relief call MD/NP).     Marland Kitchen polyethylene glycol (MIRALAX / GLYCOLAX) packet Take 17 g by mouth daily. (Patient taking differently: Take 17 g by mouth daily as needed for mild constipation. ) 14 each 1  . traMADol (ULTRAM) 50 MG tablet  Take 1 tablet (50 mg total) by mouth every 8 (eight) hours as needed for moderate pain or severe pain. 90 tablet 5  . warfarin (COUMADIN) 5 MG tablet Take 5 mg by mouth daily.     . cephALEXin (KEFLEX) 500 MG capsule Take 1 capsule (500 mg total) by mouth 4 (four) times daily. 20 capsule 0   No facility-administered medications prior to visit.     Past Medical History  Diagnosis Date  . CHF (congestive heart failure) (HCC)     EF 99991111, grade 1 diastolic dysfunction per echo 04/2015  . Diabetes ([place])  2016    Type II. On insulin  . Hypertension   . CVA (cerebral infarction) 04/2015    Started Plavix 04/2015  . Patent foramen ovale 04/2015    Started on warfarin 04/2015  . Enchondroma of bone 2011    left femur.   . Stercoral ulcer of rectum 05/16/2015  . Fracture, intertrochanteric, right femur ([place])   . Acute blood loss anemia   . Protein calorie malnutrition ([place])   . Hyperlipidemia   . History of lower GI bleeding   . 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 Depression      Past Surgical History  Procedure Laterality Date  . Cardiac surgery      Cath without stent  . Tee without cardioversion N/A 05/05/2015    Procedure: TRANSESOPHAGEAL ECHOCARDIOGRAM (TEE);  Surgeon: Dixie Dials, MD;  Location: [facility] ENDOSCOPY;  Service: Cardiovascular;  Laterality: N/A;  . Colonoscopy N/A 05/15/2015    Procedure: COLONOSCOPY;  Surgeon: Mauri Pole, MD;  Location: [facility] ENDOSCOPY;  Service: Endoscopy;  Laterality: N/A;  . Flexible sigmoidoscopy N/A 05/15/2015    Procedure: FLEXIBLE SIGMOIDOSCOPY;  Surgeon: Mauri Pole, MD;  Location: [facility] ENDOSCOPY;  Service: Endoscopy;  Laterality: N/A;  at bedside  . Intramedullary (im) nail intertrochanteric Right 06/12/2015    Procedure: INTRAMEDULLARY (IM) NAIL RIGHT HIP;  Surgeon: Renette Butters, MD;  Location: [facility];  Service: Orthopedics;  Laterality: Right;     Family History  Problem Relation Age of Onset  . Hypertension Mother   . Heart failure Mother   . Hyperlipidemia Mother   . Heart attack Father      Social History   Social History  . Marital Status: Married    Spouse Name: N/A  . Number of Children: 6  . Years of Education: 12   Occupational History  . Retired    Social History Main Topics  . Smoking status: Former Smoker    Types: Cigarettes    Quit date: 04/05/2015  . Smokeless tobacco: Never Used     Comment: Quit 04/28/15  . Alcohol Use: No  .  Drug Use: No  . Sexual Activity: Not on file   Other Topics Concern  . Not on file   Social History Narrative   She will be living with her two sons.\   Right-handed.   No caffeine use.       Review of Systems  Constitutional: Negative for fever and chills.  Eyes:       Negative for changes in vision  Respiratory: Negative for cough, chest tightness and wheezing.   Cardiovascular: Positive for leg swelling. Negative for chest pain and palpitations.  Endocrine: Negative for polydipsia, polyphagia and polyuria.  Musculoskeletal:       Positive for right spastic hemiparesis  Neurological: Positive for speech difficulty and weakness. Negative for dizziness,  light-headedness and numbness.      Objective:    BP 120/86 mmHg  Pulse 63  Temp(Src) 98.2 F (36.8 C) (Oral)  Resp 16  Ht   Wt   SpO2 98% Nursing note and vital signs reviewed.  Physical Exam  Constitutional: She is oriented to person, place, and time. She appears well-developed and well-nourished. No distress.  Cardiovascular: Normal rate, regular rhythm, normal heart sounds and intact distal pulses.   1-2+ pitting edema with right side being greater than the left. Pulses are intact and appropriate.   Pulmonary/Chest: Effort normal and breath sounds normal.  Neurological: She is alert and oriented to person, place, and time.  Decreased fine muscle control noted on the right side with spasticity present.   Skin: Skin is warm and dry.  Psychiatric: She has a normal mood and affect. Her behavior is normal. Judgment and thought content normal.       Assessment & Plan:   Problem List Items Addressed This Visit      Cardiovascular and Mediastinum   Left-sided cerebrovascular accident (CVA) ([place])    Appears stable and improving with right-sided spastic hemiparesis. He continues to work with physical therapy to improve motion. Does have gross motor movements of the lower extremity. Continue with risk factor reduction  to prevent future strokes including control of hypertension, diabetes, and hyperlipidemia. All currently maintained on medications. Blood pressure is within goal. Continue to monitor.      CHF (congestive heart failure) (HCC)    Previously diagnosed with congestive heart failure and noted to have a patent foramen ovale most recent TEE. Appears euvolemic with mild increase in edema with no chest pain or shortness of breath. There is mild concern for addition of a diuretic as her blood pressure is fairly low. She is maintained on a beta blocker for reduction of sudden cardiac death. Continue to monitor and follow-up with cardiology as indicated.      Hypertension    Hypertension is adequately controlled current regimen and below goal 140/90. No adverse side effects. Continue current dosage of metoprolol. Continue to monitor blood pressure at home and avoid hypotension secondary to increased stroke risk.        Endocrine   Diabetes ([place]) - Primary    Type 2 diabetes currently maintained on Lantus and metformin with gastrointestinal distress experience with metformin. Most previous A1c is 7.6. Diabetic foot exam completed by podiatry and 12/03/15. Maintained on atorvastatin for CAD risk reduction. Declines Pneumovax. Continue to monitor blood sugars at home. Continue current dosage of Lantus pending A1c results. Hold metformin pending A1c results. Refer to ophthalmology for diabetic eye exam.      Relevant Orders   Hemoglobin A1c   Comprehensive metabolic panel   Urine Microalbumin w/creat. ratio   Ambulatory referral to Ophthalmology     Other   Bilateral lower extremity edema    Right-sided edema worse on left with left being scant. Most likely related to spastic hemiparesis. Treat conservatively with elevation, decreased sodium in diet, and compression socks as needed. Diuresis held currently secondary to lower blood pressure with concern for lowering blood pressure which may result in stroke.  Continue to monitor and follow-up if symptoms worsen or do not improve.          I have discontinued Ms. Koerber's cephALEXin. I am also having her maintain her nitroGLYCERIN, atorvastatin, polyethylene glycol, warfarin, escitalopram, Vitamin D2, insulin glargine, metoprolol succinate, gabapentin, traMADol, and baclofen.   No orders of the defined  types were placed in this encounter.     Follow-up: No Follow-up on file.  Mauricio Po, FNP

## 2015-12-23 NOTE — Assessment & Plan Note (Signed)
Previously diagnosed with congestive heart failure and noted to have a patent foramen ovale most recent TEE. Appears euvolemic with mild increase in edema with no chest pain or shortness of breath. There is mild concern for addition of a diuretic as her blood pressure is fairly low. She is maintained on a beta blocker for reduction of sudden cardiac death. Continue to monitor and follow-up with cardiology as indicated.

## 2015-12-23 NOTE — Patient Instructions (Signed)
Thank you for choosing [facility].  Summary/Instructions:  Please stop by the lab on the lower level of the building for your blood work. Your results will be released to [place] (or called to you) after review, usually within 72 hours after test completion. If any changes need to be made, you will be notified at that same time.  Please keep your legs elevated; decrease salt; consider a light compression sock to help with swelling. Also work on ankle pumps to help with fluid.   Continue to take your medications as prescribed.  We will make changes to your diabetes medications pending your A1c.   We will call when a handicap form is ready to be picked.

## 2015-12-23 NOTE — Progress Notes (Signed)
Pre visit review using our clinic review tool, if applicable. No additional management support is needed unless otherwise documented below in the visit note. 

## 2015-12-23 NOTE — Assessment & Plan Note (Signed)
Hypertension is adequately controlled current regimen and below goal 140/90. No adverse side effects. Continue current dosage of metoprolol. Continue to monitor blood pressure at home and avoid hypotension secondary to increased stroke risk.

## 2015-12-24 ENCOUNTER — Ambulatory Visit: Payer: Medicare Other | Admitting: *Deleted

## 2015-12-24 DIAGNOSIS — S72141D Displaced intertrochanteric fracture of right femur, subsequent encounter for closed fracture with routine healing: Secondary | ICD-10-CM | POA: Diagnosis not present

## 2015-12-24 DIAGNOSIS — I69151 Hemiplegia and hemiparesis following nontraumatic intracerebral hemorrhage affecting right dominant side: Secondary | ICD-10-CM | POA: Diagnosis not present

## 2015-12-25 ENCOUNTER — Telehealth: Payer: Self-pay

## 2015-12-25 NOTE — Telephone Encounter (Signed)
Called pt and LVM letting her know that her handicap placard is ready for pick up.

## 2015-12-26 DIAGNOSIS — I69151 Hemiplegia and hemiparesis following nontraumatic intracerebral hemorrhage affecting right dominant side: Secondary | ICD-10-CM | POA: Diagnosis not present

## 2015-12-26 DIAGNOSIS — S72141D Displaced intertrochanteric fracture of right femur, subsequent encounter for closed fracture with routine healing: Secondary | ICD-10-CM | POA: Diagnosis not present

## 2015-12-30 ENCOUNTER — Telehealth: Payer: Self-pay | Admitting: Family

## 2015-12-30 DIAGNOSIS — S72141D Displaced intertrochanteric fracture of right femur, subsequent encounter for closed fracture with routine healing: Secondary | ICD-10-CM | POA: Diagnosis not present

## 2015-12-30 DIAGNOSIS — I69151 Hemiplegia and hemiparesis following nontraumatic intracerebral hemorrhage affecting right dominant side: Secondary | ICD-10-CM | POA: Diagnosis not present

## 2015-12-30 NOTE — Telephone Encounter (Signed)
Please inform patient that her hemoglobin A1c is elevated from 7.4-8.4 indicating worse control of her diabetes. Please have her check her drug formulary to see what medications are covered and affordable to help reduce her blood sugars. In the meantime, please have her increase her Lantus to 20 units daily. Continue to monitor blood sugars at home. Otherwise, her kidney function, liver function, electrolyte are all within the normal ranges. We'll plan to follow-up in 3 months or sooner pending what drugs are on her formulary.

## 2015-12-31 NOTE — Telephone Encounter (Signed)
Pts son is aware of results. Will check with drug formulary and call back.

## 2016-01-01 DIAGNOSIS — I69151 Hemiplegia and hemiparesis following nontraumatic intracerebral hemorrhage affecting right dominant side: Secondary | ICD-10-CM | POA: Diagnosis not present

## 2016-01-01 DIAGNOSIS — S72141D Displaced intertrochanteric fracture of right femur, subsequent encounter for closed fracture with routine healing: Secondary | ICD-10-CM | POA: Diagnosis not present

## 2016-01-05 DIAGNOSIS — I69151 Hemiplegia and hemiparesis following nontraumatic intracerebral hemorrhage affecting right dominant side: Secondary | ICD-10-CM | POA: Diagnosis not present

## 2016-01-05 DIAGNOSIS — S72141D Displaced intertrochanteric fracture of right femur, subsequent encounter for closed fracture with routine healing: Secondary | ICD-10-CM | POA: Diagnosis not present

## 2016-01-08 DIAGNOSIS — I69151 Hemiplegia and hemiparesis following nontraumatic intracerebral hemorrhage affecting right dominant side: Secondary | ICD-10-CM | POA: Diagnosis not present

## 2016-01-08 DIAGNOSIS — S72141D Displaced intertrochanteric fracture of right femur, subsequent encounter for closed fracture with routine healing: Secondary | ICD-10-CM | POA: Diagnosis not present

## 2016-01-12 ENCOUNTER — Telehealth: Payer: Self-pay | Admitting: Emergency Medicine

## 2016-01-12 NOTE — Telephone Encounter (Signed)
Patients son called and patient has been in pain for about 2 wk. Is there was way she can get some pain med or anything to help her out in this situation. Please advise and give him a call back thanks.

## 2016-01-13 NOTE — Telephone Encounter (Signed)
Please have her make a follow up appointment to discuss her pain management.

## 2016-01-14 NOTE — Telephone Encounter (Signed)
Spoke with son and made a follow up appointment to discuss pain management

## 2016-01-15 ENCOUNTER — Encounter: Payer: Self-pay | Admitting: *Deleted

## 2016-01-15 ENCOUNTER — Encounter: Payer: Medicare Other | Attending: Cardiology | Admitting: *Deleted

## 2016-01-15 VITALS — Wt 140.0 lb

## 2016-01-15 DIAGNOSIS — I69151 Hemiplegia and hemiparesis following nontraumatic intracerebral hemorrhage affecting right dominant side: Secondary | ICD-10-CM | POA: Diagnosis not present

## 2016-01-15 DIAGNOSIS — E7800 Pure hypercholesterolemia, unspecified: Secondary | ICD-10-CM | POA: Diagnosis not present

## 2016-01-15 DIAGNOSIS — I10 Essential (primary) hypertension: Secondary | ICD-10-CM | POA: Insufficient documentation

## 2016-01-15 DIAGNOSIS — E119 Type 2 diabetes mellitus without complications: Secondary | ICD-10-CM | POA: Insufficient documentation

## 2016-01-15 DIAGNOSIS — S72141D Displaced intertrochanteric fracture of right femur, subsequent encounter for closed fracture with routine healing: Secondary | ICD-10-CM | POA: Diagnosis not present

## 2016-01-15 DIAGNOSIS — Z713 Dietary counseling and surveillance: Secondary | ICD-10-CM | POA: Diagnosis not present

## 2016-01-15 DIAGNOSIS — E118 Type 2 diabetes mellitus with unspecified complications: Secondary | ICD-10-CM

## 2016-01-15 NOTE — Patient Instructions (Signed)
Plan:  Aim for 2 Carb Choices per meal (30 grams) +/- 1 either way  Aim for 0-1 Carbs per snack if hungry  Include protein in moderation with your meals and snacks Consider reading food labels for Total Carbohydrate  of foods Continue with your activity level by doing physical therapy daily as tolerated Continue checking BG at alternate times per day  Continue taking medication as directed by MD

## 2016-01-15 NOTE — Progress Notes (Signed)
Diabetes Self-Management Education  Visit Type:    Appt. Start Time: 1030 Appt. End Time: 1100  01/15/2016  [name], identified by name and date of birth, is a 70 y.o. female with a diagnosis of Diabetes:  . Patient here with her son who cares for her since she had a stroke last October.   ASSESSMENT  Weight 140 lb (63.504 kg). Body mass index is 21.92 kg/(m^2).      Diabetes Self-Management Education - 01/15/16 0953    Health Coping   How would you rate your overall health? Fair   Psychosocial Assessment   Patient Belief/Attitude about Diabetes Motivated to manage diabetes   Self-care barriers Debilitated state due to current medical condition   Self-management support Family   Other persons present Patient;Family Member  adult son   Patient Concerns Glycemic Control;Nutrition/Meal planning;Medication;Monitoring   Special Needs Instruct caregiver   Learning Readiness Change in progress   What is the last grade level you completed in school? 12   Pre-Education Assessment   Patient understands the diabetes disease and treatment process. Needs Instruction   Patient understands incorporating nutritional management into lifestyle. Needs Instruction   Patient undertands incorporating physical activity into lifestyle. Needs Review   Patient understands using medications safely. Needs Instruction   Patient understands monitoring blood glucose, interpreting and using results Needs Instruction   Patient understands prevention, detection, and treatment of acute complications. Needs Instruction   Patient understands prevention, detection, and treatment of chronic complications. Needs Instruction   Patient understands how to develop strategies to address psychosocial issues. Needs Review   Patient understands how to develop strategies to promote health/change behavior. Needs Review   Complications   Last HgB A1C per patient/outside source 8.4 %   How often do you check your  blood sugar? 1-2 times/day   Fasting Blood glucose range (mg/dL) 70-129   Postprandial Blood glucose range (mg/dL) 130-179   Number of hypoglycemic episodes per month 0   Have you had a dilated eye exam in the past 12 months? No   Have you had a dental exam in the past 12 months? No   Are you checking your feet? Yes   How many days per week are you checking your feet? 4   Dietary Intake   Breakfast 8:30 :boiled egg, banana, [place] sausage,   Snack (morning) no   Lunch cooks hot meal for lunch: lean meat, vegetables, (no starches now)   Snack (afternoon) fresh fruit occasionally or sugar free cookies or applesauce   Dinner left overs from lunch meal    Snack (evening) sugar free ice cream OR chips OR sugar free cookie   Beverage(s) diet V-8 juice, diet soda,    Exercise   Exercise Type Light (walking / raking leaves)  physical therapy daily   How many days per week to you exercise? 5   How many minutes per day do you exercise? 15   Total minutes per week of exercise 75   Patient Education   Previous Diabetes Education No   Disease state  Definition of diabetes, type 1 and 2, and the diagnosis of diabetes   Nutrition management  Role of diet in the treatment of diabetes and the relationship between the three main macronutrients and blood glucose level;Food label reading, portion sizes and measuring food.;Carbohydrate counting   Physical activity and exercise  Helped patient identify appropriate exercises in relation to his/her diabetes, diabetes complications and other health issue.   Medications Reviewed patients medication  for diabetes, action, purpose, timing of dose and side effects.   Monitoring Purpose and frequency of SMBG.;Identified appropriate SMBG and/or A1C goals.   Acute complications Taught treatment of hypoglycemia - the 15 rule.   Individualized Goals (developed by patient)   Nutrition Follow meal plan discussed   Physical Activity Exercise 3-5 times per week    Medications take my medication as prescribed   Monitoring  test blood glucose pre and post meals as discussed   Post-Education Assessment   Patient understands the diabetes disease and treatment process. Demonstrates understanding / competency   Patient understands incorporating nutritional management into lifestyle. Demonstrates understanding / competency   Patient undertands incorporating physical activity into lifestyle. Demonstrates understanding / competency   Patient understands using medications safely. Demonstrates understanding / competency   Patient understands monitoring blood glucose, interpreting and using results Demonstrates understanding / competency   Patient understands prevention, detection, and treatment of acute complications. Demonstrates understanding / competency   Patient understands prevention, detection, and treatment of chronic complications. Demonstrates understanding / competency   Outcomes   Expected Outcomes Demonstrated interest in learning. Expect positive outcomes   Future DMSE PRN   Program Status Completed      Individualized Plan for Diabetes Self-Management Training:   Learning Objective:  Patient will have a greater understanding of diabetes self-management. Patient education plan is to attend individual and/or group sessions per assessed needs and concerns.   Plan:   Patient Instructions  Plan:  Aim for 2 Carb Choices per meal (30 grams) +/- 1 either way  Aim for 0-1 Carbs per snack if hungry  Include protein in moderation with your meals and snacks Consider reading food labels for Total Carbohydrate  of foods Continue with your activity level by doing physical therapy daily as tolerated Continue checking BG at alternate times per day  Continue taking medication as directed by MD       Expected Outcomes:  Demonstrated interest in learning. Expect positive outcomes  Education material provided: Living Well with Diabetes, Food label  handouts, A1C conversion sheet, Meal plan card and Carbohydrate counting sheet, Snack handout  If problems or questions, patient to contact team via:  Phone and Email  Future DSME appointment: PRN

## 2016-01-20 ENCOUNTER — Encounter: Payer: Self-pay | Admitting: Family

## 2016-01-20 ENCOUNTER — Ambulatory Visit (INDEPENDENT_AMBULATORY_CARE_PROVIDER_SITE_OTHER): Payer: Medicare Other | Admitting: Family

## 2016-01-20 VITALS — BP 102/58 | HR 56 | Temp 98.5°F

## 2016-01-20 DIAGNOSIS — M62838 Other muscle spasm: Secondary | ICD-10-CM | POA: Insufficient documentation

## 2016-01-20 DIAGNOSIS — I2510 Atherosclerotic heart disease of native coronary artery without angina pectoris: Secondary | ICD-10-CM | POA: Diagnosis not present

## 2016-01-20 DIAGNOSIS — M6249 Contracture of muscle, multiple sites: Secondary | ICD-10-CM | POA: Diagnosis not present

## 2016-01-20 MED ORDER — TRAMADOL HCL 50 MG PO TABS: 50.0000 mg | ORAL_TABLET | Freq: Three times a day (TID) | ORAL | Status: DC | PRN

## 2016-01-20 NOTE — Assessment & Plan Note (Addendum)
Increased pain most likely associated with muscle spasticity that is refractory to the current treatment regimen. Cortisone injection will likely provide minimal to no relief. Continue current dosage of baclofen and gabapentin. Increase Tramadol. Discuss with neurology possible switch from baclofen if some relief is not obtained. Patient and son are in agreement with the treatment plan.

## 2016-01-20 NOTE — Patient Instructions (Signed)
Thank you for choosing [facility].  Summary/Instructions:  Increase Tramadol to 50-100 mg every 8 hours as needed.  Continue gabapentin and baclofen.   Your prescription(s) have been submitted to your pharmacy or been printed and provided for you. Please take as directed and contact our office if you believe you are having problem(s) with the medication(s) or have any questions.  If your symptoms worsen or fail to improve, please contact our office for further instruction, or in case of emergency go directly to the emergency room at the closest medical facility.

## 2016-01-20 NOTE — Progress Notes (Signed)
Subjective:    Patient ID: [name], female    DOB: 1945/11/19, 70 y.o.   MRN: [id number]  Chief Complaint  Patient presents with  . Follow-up    Pain Management    HPI:  [name] is a 70 y.o. female who  has a past medical history of CHF (congestive heart failure) ([place]); Diabetes ([place]) (2016); Hypertension; CVA (cerebral infarction) (04/2015); Patent foramen ovale (04/2015); Enchondroma of bone (2011); Stercoral ulcer of rectum (05/16/2015); Fracture, intertrochanteric, right femur ([place]); Acute blood loss anemia; Protein calorie malnutrition ([place]); Hyperlipidemia; History of lower GI bleeding; Coronary artery disease involving native artery of transplanted heart without angina pectoris; and Depression. and presents today for a follow up office visit. Patient's son is present for today's visit and provides a significant amount of history as patient has aphasia.   Associated symptom of increased pain located throughout her right upper and lower extremity has been going on since her initial stroke. Pain is described as throbbing located in her right shoulder and unable to describe the quality of the pain located in the her right lower extremity. Modifying factors include Tramadol, baclofen and Gabapentin which generally help some. The pain generally waxes and wanes. She continues to work with therapies to improve function. Son describes how he can move patient's arm and it feels like the nerves are firing very rapidly.    Allergies  Allergen Reactions  . Pork-Derived Products Other (See Comments)    To keep blood pressure down     Current Outpatient Prescriptions on File Prior to Visit  Medication Sig Dispense Refill  . atorvastatin (LIPITOR) 40 MG tablet Take 1 tablet (40 mg total) by mouth daily. 30 tablet 3  . baclofen (LIORESAL) 10 MG tablet Take 1 tablet (10 mg total) by mouth 3 (three) times daily. 90 tablet 11  . Ergocalciferol (VITAMIN D2) 400 units TABS Take 1 tablet  by mouth daily.    Marland Kitchen escitalopram (LEXAPRO) 10 MG tablet Take 10 mg by mouth daily as needed (anxiety).     . gabapentin (NEURONTIN) 300 MG capsule Take 1 capsule (300 mg total) by mouth 3 (three) times daily. 90 capsule 11  . insulin glargine (LANTUS) 100 UNIT/ML injection Inject 12 Units into the skin at bedtime.     . metFORMIN (GLUCOPHAGE) 500 MG tablet Take 500 mg by mouth 2 (two) times daily with a meal.    . metoprolol succinate (TOPROL-XL) 25 MG 24 hr tablet Take 25 mg by mouth daily. Hold for SBP <110 or HR <60    . nitroGLYCERIN (NITROSTAT) 0.4 MG SL tablet Place 0.4 mg under the tongue every 5 (five) minutes as needed for chest pain (up to 3 doses.  If no relief call MD/NP).     Marland Kitchen polyethylene glycol (MIRALAX / GLYCOLAX) packet Take 17 g by mouth daily. (Patient taking differently: Take 17 g by mouth daily as needed for mild constipation. ) 14 each 1  . warfarin (COUMADIN) 5 MG tablet Take 5 mg by mouth daily.      No current facility-administered medications on file prior to visit.     Past Surgical History  Procedure Laterality Date  . Cardiac surgery      Cath without stent  . Tee without cardioversion N/A 05/05/2015    Procedure: TRANSESOPHAGEAL ECHOCARDIOGRAM (TEE);  Surgeon: Dixie Dials, MD;  Location: [facility] ENDOSCOPY;  Service: Cardiovascular;  Laterality: N/A;  . Colonoscopy N/A 05/15/2015    Procedure: COLONOSCOPY;  Surgeon: Karleen Hampshire  Bary Richard, MD;  Location: [facility] ENDOSCOPY;  Service: Endoscopy;  Laterality: N/A;  . Flexible sigmoidoscopy N/A 05/15/2015    Procedure: FLEXIBLE SIGMOIDOSCOPY;  Surgeon: Mauri Pole, MD;  Location: [facility] ENDOSCOPY;  Service: Endoscopy;  Laterality: N/A;  at bedside  . Intramedullary (im) nail intertrochanteric Right 06/12/2015    Procedure: INTRAMEDULLARY (IM) NAIL RIGHT HIP;  Surgeon: Renette Butters, MD;  Location: [facility];  Service: Orthopedics;  Laterality: Right;      Review of Systems  Constitutional: Negative for fever and chills.    Respiratory: Negative for chest tightness and shortness of breath.   Cardiovascular: Negative for chest pain, palpitations and leg swelling.  Neurological: Positive for weakness and numbness.      Objective:    BP 102/58 mmHg  Pulse 56  Temp(Src) 98.5 F (36.9 C) (Oral) Nursing note and vital signs reviewed.  Physical Exam  Constitutional: She is oriented to person, place, and time. She appears well-developed and well-nourished. No distress.  Slurred speech with asphasia noted.   Cardiovascular: Normal rate, regular rhythm, normal heart sounds and intact distal pulses.   Pulmonary/Chest: Effort normal and breath sounds normal.  Neurological: She is alert and oriented to person, place, and time.  Decreased fine muscle control noted on the right side with spasticity present.  Skin: Skin is warm and dry.  Psychiatric: She has a normal mood and affect. Her behavior is normal. Judgment and thought content normal.       Assessment & Plan:   Problem List Items Addressed This Visit      Other   Muscle spasticity - Primary    Increased pain most likely associated with muscle spasticity that is refractory to the current treatment regimen. Cortisone injection will likely provide minimal to no relief. Continue current dosage of baclofen and gabapentin. Increase Tramadol. Discuss with neurology possible switch from baclofen if some relief is not obtained. Patient and son are in agreement with the treatment plan.          I have changed Ms. Wieseler's traMADol. I am also having her maintain her nitroGLYCERIN, atorvastatin, polyethylene glycol, warfarin, escitalopram, Vitamin D2, insulin glargine, metoprolol succinate, gabapentin, baclofen, and metFORMIN.   Meds ordered this encounter  Medications  . traMADol (ULTRAM) 50 MG tablet    Sig: Take 1-2 tablets (50-100 mg total) by mouth every 8 (eight) hours as needed for moderate pain or severe pain.    Dispense:  180 tablet    Refill:   2    Cancel previous Tramadol order.    Order Specific Question:  Supervising Provider    Answer:  Pricilla Holm A [id number]     Follow-up: Return in about 1 month (around 02/20/2016).  Mauricio Po, FNP

## 2016-01-20 NOTE — Progress Notes (Signed)
Pre visit review using our clinic review tool, if applicable. No additional management support is needed unless otherwise documented below in the visit note. 

## 2016-01-21 DIAGNOSIS — S72141D Displaced intertrochanteric fracture of right femur, subsequent encounter for closed fracture with routine healing: Secondary | ICD-10-CM | POA: Diagnosis not present

## 2016-01-21 DIAGNOSIS — I69151 Hemiplegia and hemiparesis following nontraumatic intracerebral hemorrhage affecting right dominant side: Secondary | ICD-10-CM | POA: Diagnosis not present

## 2016-01-28 DIAGNOSIS — S72141D Displaced intertrochanteric fracture of right femur, subsequent encounter for closed fracture with routine healing: Secondary | ICD-10-CM | POA: Diagnosis not present

## 2016-01-28 DIAGNOSIS — I69151 Hemiplegia and hemiparesis following nontraumatic intracerebral hemorrhage affecting right dominant side: Secondary | ICD-10-CM | POA: Diagnosis not present

## 2016-01-29 DIAGNOSIS — S72141D Displaced intertrochanteric fracture of right femur, subsequent encounter for closed fracture with routine healing: Secondary | ICD-10-CM | POA: Diagnosis not present

## 2016-01-29 DIAGNOSIS — I69151 Hemiplegia and hemiparesis following nontraumatic intracerebral hemorrhage affecting right dominant side: Secondary | ICD-10-CM | POA: Diagnosis not present

## 2016-01-30 DIAGNOSIS — S72141D Displaced intertrochanteric fracture of right femur, subsequent encounter for closed fracture with routine healing: Secondary | ICD-10-CM | POA: Diagnosis not present

## 2016-01-30 DIAGNOSIS — I69151 Hemiplegia and hemiparesis following nontraumatic intracerebral hemorrhage affecting right dominant side: Secondary | ICD-10-CM | POA: Diagnosis not present

## 2016-02-05 DIAGNOSIS — I69151 Hemiplegia and hemiparesis following nontraumatic intracerebral hemorrhage affecting right dominant side: Secondary | ICD-10-CM | POA: Diagnosis not present

## 2016-02-05 DIAGNOSIS — S72141D Displaced intertrochanteric fracture of right femur, subsequent encounter for closed fracture with routine healing: Secondary | ICD-10-CM | POA: Diagnosis not present

## 2016-02-10 ENCOUNTER — Ambulatory Visit: Payer: BC Managed Care – PPO | Admitting: *Deleted

## 2016-02-10 DIAGNOSIS — E119 Type 2 diabetes mellitus without complications: Secondary | ICD-10-CM | POA: Diagnosis not present

## 2016-02-10 DIAGNOSIS — E1142 Type 2 diabetes mellitus with diabetic polyneuropathy: Secondary | ICD-10-CM

## 2016-02-10 DIAGNOSIS — H5201 Hypermetropia, right eye: Secondary | ICD-10-CM | POA: Diagnosis not present

## 2016-02-10 LAB — HM DIABETES EYE EXAM

## 2016-02-10 NOTE — Progress Notes (Signed)
Patient ID: [name], female   DOB: 13-Mar-1946, 70 y.o.   MRN: [id number]  Patient presents to be scanned and measured for diabetic shoes and inserts.

## 2016-02-11 ENCOUNTER — Encounter: Payer: Self-pay | Admitting: Family

## 2016-02-12 DIAGNOSIS — E1140 Type 2 diabetes mellitus with diabetic neuropathy, unspecified: Secondary | ICD-10-CM | POA: Diagnosis not present

## 2016-02-12 DIAGNOSIS — F17290 Nicotine dependence, other tobacco product, uncomplicated: Secondary | ICD-10-CM | POA: Diagnosis not present

## 2016-02-12 DIAGNOSIS — I2510 Atherosclerotic heart disease of native coronary artery without angina pectoris: Secondary | ICD-10-CM | POA: Diagnosis not present

## 2016-02-12 DIAGNOSIS — I639 Cerebral infarction, unspecified: Secondary | ICD-10-CM | POA: Diagnosis not present

## 2016-02-12 DIAGNOSIS — E119 Type 2 diabetes mellitus without complications: Secondary | ICD-10-CM | POA: Diagnosis not present

## 2016-02-12 DIAGNOSIS — E785 Hyperlipidemia, unspecified: Secondary | ICD-10-CM | POA: Diagnosis not present

## 2016-02-12 DIAGNOSIS — I10 Essential (primary) hypertension: Secondary | ICD-10-CM | POA: Diagnosis not present

## 2016-02-13 DIAGNOSIS — S72141D Displaced intertrochanteric fracture of right femur, subsequent encounter for closed fracture with routine healing: Secondary | ICD-10-CM | POA: Diagnosis not present

## 2016-02-13 DIAGNOSIS — I69151 Hemiplegia and hemiparesis following nontraumatic intracerebral hemorrhage affecting right dominant side: Secondary | ICD-10-CM | POA: Diagnosis not present

## 2016-02-18 DIAGNOSIS — I69151 Hemiplegia and hemiparesis following nontraumatic intracerebral hemorrhage affecting right dominant side: Secondary | ICD-10-CM | POA: Diagnosis not present

## 2016-02-18 DIAGNOSIS — S72141D Displaced intertrochanteric fracture of right femur, subsequent encounter for closed fracture with routine healing: Secondary | ICD-10-CM | POA: Diagnosis not present

## 2016-02-25 DIAGNOSIS — S72141D Displaced intertrochanteric fracture of right femur, subsequent encounter for closed fracture with routine healing: Secondary | ICD-10-CM | POA: Diagnosis not present

## 2016-02-25 DIAGNOSIS — I69151 Hemiplegia and hemiparesis following nontraumatic intracerebral hemorrhage affecting right dominant side: Secondary | ICD-10-CM | POA: Diagnosis not present

## 2016-02-26 DIAGNOSIS — S72141D Displaced intertrochanteric fracture of right femur, subsequent encounter for closed fracture with routine healing: Secondary | ICD-10-CM | POA: Diagnosis not present

## 2016-02-26 DIAGNOSIS — I69151 Hemiplegia and hemiparesis following nontraumatic intracerebral hemorrhage affecting right dominant side: Secondary | ICD-10-CM | POA: Diagnosis not present

## 2016-03-04 DIAGNOSIS — I69151 Hemiplegia and hemiparesis following nontraumatic intracerebral hemorrhage affecting right dominant side: Secondary | ICD-10-CM | POA: Diagnosis not present

## 2016-03-04 DIAGNOSIS — S72141D Displaced intertrochanteric fracture of right femur, subsequent encounter for closed fracture with routine healing: Secondary | ICD-10-CM | POA: Diagnosis not present

## 2016-03-05 DIAGNOSIS — I69151 Hemiplegia and hemiparesis following nontraumatic intracerebral hemorrhage affecting right dominant side: Secondary | ICD-10-CM | POA: Diagnosis not present

## 2016-03-05 DIAGNOSIS — S72141D Displaced intertrochanteric fracture of right femur, subsequent encounter for closed fracture with routine healing: Secondary | ICD-10-CM | POA: Diagnosis not present

## 2016-03-10 ENCOUNTER — Ambulatory Visit (INDEPENDENT_AMBULATORY_CARE_PROVIDER_SITE_OTHER): Payer: BC Managed Care – PPO | Admitting: Podiatry

## 2016-03-10 DIAGNOSIS — S72141D Displaced intertrochanteric fracture of right femur, subsequent encounter for closed fracture with routine healing: Secondary | ICD-10-CM | POA: Diagnosis not present

## 2016-03-10 DIAGNOSIS — I69151 Hemiplegia and hemiparesis following nontraumatic intracerebral hemorrhage affecting right dominant side: Secondary | ICD-10-CM | POA: Diagnosis not present

## 2016-03-10 NOTE — Progress Notes (Signed)
NO SHOW-ERRONEOUS ENCOUNTER 

## 2016-03-11 DIAGNOSIS — S72141D Displaced intertrochanteric fracture of right femur, subsequent encounter for closed fracture with routine healing: Secondary | ICD-10-CM | POA: Diagnosis not present

## 2016-03-11 DIAGNOSIS — I69151 Hemiplegia and hemiparesis following nontraumatic intracerebral hemorrhage affecting right dominant side: Secondary | ICD-10-CM | POA: Diagnosis not present

## 2016-03-16 DIAGNOSIS — S72141D Displaced intertrochanteric fracture of right femur, subsequent encounter for closed fracture with routine healing: Secondary | ICD-10-CM | POA: Diagnosis not present

## 2016-03-16 DIAGNOSIS — I69151 Hemiplegia and hemiparesis following nontraumatic intracerebral hemorrhage affecting right dominant side: Secondary | ICD-10-CM | POA: Diagnosis not present

## 2016-03-18 DIAGNOSIS — S72141D Displaced intertrochanteric fracture of right femur, subsequent encounter for closed fracture with routine healing: Secondary | ICD-10-CM | POA: Diagnosis not present

## 2016-03-18 DIAGNOSIS — I69151 Hemiplegia and hemiparesis following nontraumatic intracerebral hemorrhage affecting right dominant side: Secondary | ICD-10-CM | POA: Diagnosis not present

## 2016-03-23 ENCOUNTER — Ambulatory Visit: Payer: Self-pay | Admitting: Neurology

## 2016-03-23 ENCOUNTER — Telehealth: Payer: Self-pay | Admitting: *Deleted

## 2016-03-23 NOTE — Telephone Encounter (Signed)
No showed follow up appointment. 

## 2016-03-24 ENCOUNTER — Encounter: Payer: Self-pay | Admitting: Neurology

## 2016-03-25 DIAGNOSIS — S72141D Displaced intertrochanteric fracture of right femur, subsequent encounter for closed fracture with routine healing: Secondary | ICD-10-CM | POA: Diagnosis not present

## 2016-03-25 DIAGNOSIS — I69151 Hemiplegia and hemiparesis following nontraumatic intracerebral hemorrhage affecting right dominant side: Secondary | ICD-10-CM | POA: Diagnosis not present

## 2016-03-26 DIAGNOSIS — I69151 Hemiplegia and hemiparesis following nontraumatic intracerebral hemorrhage affecting right dominant side: Secondary | ICD-10-CM | POA: Diagnosis not present

## 2016-03-26 DIAGNOSIS — S72141D Displaced intertrochanteric fracture of right femur, subsequent encounter for closed fracture with routine healing: Secondary | ICD-10-CM | POA: Diagnosis not present

## 2016-03-30 DIAGNOSIS — I69190 Apraxia following nontraumatic intracerebral hemorrhage: Secondary | ICD-10-CM | POA: Diagnosis not present

## 2016-03-30 DIAGNOSIS — I69151 Hemiplegia and hemiparesis following nontraumatic intracerebral hemorrhage affecting right dominant side: Secondary | ICD-10-CM | POA: Diagnosis not present

## 2016-04-02 DIAGNOSIS — I69190 Apraxia following nontraumatic intracerebral hemorrhage: Secondary | ICD-10-CM | POA: Diagnosis not present

## 2016-04-02 DIAGNOSIS — I69151 Hemiplegia and hemiparesis following nontraumatic intracerebral hemorrhage affecting right dominant side: Secondary | ICD-10-CM | POA: Diagnosis not present

## 2016-04-06 DIAGNOSIS — I69190 Apraxia following nontraumatic intracerebral hemorrhage: Secondary | ICD-10-CM | POA: Diagnosis not present

## 2016-04-06 DIAGNOSIS — I69151 Hemiplegia and hemiparesis following nontraumatic intracerebral hemorrhage affecting right dominant side: Secondary | ICD-10-CM | POA: Diagnosis not present

## 2016-04-13 ENCOUNTER — Ambulatory Visit (INDEPENDENT_AMBULATORY_CARE_PROVIDER_SITE_OTHER): Payer: BC Managed Care – PPO | Admitting: Podiatry

## 2016-04-13 DIAGNOSIS — E1142 Type 2 diabetes mellitus with diabetic polyneuropathy: Secondary | ICD-10-CM

## 2016-04-13 DIAGNOSIS — E1159 Type 2 diabetes mellitus with other circulatory complications: Secondary | ICD-10-CM

## 2016-04-13 DIAGNOSIS — M2040 Other hammer toe(s) (acquired), unspecified foot: Secondary | ICD-10-CM

## 2016-04-13 NOTE — Patient Instructions (Signed)

## 2016-04-13 NOTE — Progress Notes (Signed)
Patient ID: [name], female   DOB: 04-01-1946, 70 y.o.   MRN: [id number] Patient presents for diabetic shoe pick up, shoes are tried on for good fit.  Patient received 1 pair Sycamore black in women's size 10 extra wide and 3 pairs custom molded diabetic inserts.  Verbal and written break in and wear instructions given.

## 2016-04-17 DIAGNOSIS — I69151 Hemiplegia and hemiparesis following nontraumatic intracerebral hemorrhage affecting right dominant side: Secondary | ICD-10-CM | POA: Diagnosis not present

## 2016-04-17 DIAGNOSIS — I69190 Apraxia following nontraumatic intracerebral hemorrhage: Secondary | ICD-10-CM | POA: Diagnosis not present

## 2016-04-22 DIAGNOSIS — I69190 Apraxia following nontraumatic intracerebral hemorrhage: Secondary | ICD-10-CM | POA: Diagnosis not present

## 2016-04-22 DIAGNOSIS — I69151 Hemiplegia and hemiparesis following nontraumatic intracerebral hemorrhage affecting right dominant side: Secondary | ICD-10-CM | POA: Diagnosis not present

## 2016-04-29 DIAGNOSIS — I69151 Hemiplegia and hemiparesis following nontraumatic intracerebral hemorrhage affecting right dominant side: Secondary | ICD-10-CM | POA: Diagnosis not present

## 2016-04-29 DIAGNOSIS — I69190 Apraxia following nontraumatic intracerebral hemorrhage: Secondary | ICD-10-CM | POA: Diagnosis not present

## 2016-05-05 ENCOUNTER — Ambulatory Visit: Payer: Medicare Other | Admitting: Podiatry

## 2016-05-07 DIAGNOSIS — I69190 Apraxia following nontraumatic intracerebral hemorrhage: Secondary | ICD-10-CM | POA: Diagnosis not present

## 2016-05-07 DIAGNOSIS — I69151 Hemiplegia and hemiparesis following nontraumatic intracerebral hemorrhage affecting right dominant side: Secondary | ICD-10-CM | POA: Diagnosis not present

## 2016-05-12 ENCOUNTER — Ambulatory Visit: Payer: BC Managed Care – PPO | Admitting: Podiatry

## 2016-05-14 DIAGNOSIS — I69151 Hemiplegia and hemiparesis following nontraumatic intracerebral hemorrhage affecting right dominant side: Secondary | ICD-10-CM | POA: Diagnosis not present

## 2016-05-14 DIAGNOSIS — I69190 Apraxia following nontraumatic intracerebral hemorrhage: Secondary | ICD-10-CM | POA: Diagnosis not present

## 2016-05-19 ENCOUNTER — Encounter: Payer: Self-pay | Admitting: Podiatry

## 2016-05-19 ENCOUNTER — Ambulatory Visit (INDEPENDENT_AMBULATORY_CARE_PROVIDER_SITE_OTHER): Payer: BC Managed Care – PPO | Admitting: Podiatry

## 2016-05-19 DIAGNOSIS — L608 Other nail disorders: Secondary | ICD-10-CM

## 2016-05-19 DIAGNOSIS — M79609 Pain in unspecified limb: Secondary | ICD-10-CM

## 2016-05-19 DIAGNOSIS — B351 Tinea unguium: Secondary | ICD-10-CM | POA: Diagnosis not present

## 2016-05-19 DIAGNOSIS — L603 Nail dystrophy: Secondary | ICD-10-CM

## 2016-05-19 DIAGNOSIS — E0843 Diabetes mellitus due to underlying condition with diabetic autonomic (poly)neuropathy: Secondary | ICD-10-CM

## 2016-05-19 DIAGNOSIS — Z23 Encounter for immunization: Secondary | ICD-10-CM | POA: Diagnosis not present

## 2016-05-25 DIAGNOSIS — I69190 Apraxia following nontraumatic intracerebral hemorrhage: Secondary | ICD-10-CM | POA: Diagnosis not present

## 2016-05-25 DIAGNOSIS — I69151 Hemiplegia and hemiparesis following nontraumatic intracerebral hemorrhage affecting right dominant side: Secondary | ICD-10-CM | POA: Diagnosis not present

## 2016-06-03 DIAGNOSIS — I2510 Atherosclerotic heart disease of native coronary artery without angina pectoris: Secondary | ICD-10-CM | POA: Diagnosis not present

## 2016-06-03 DIAGNOSIS — E119 Type 2 diabetes mellitus without complications: Secondary | ICD-10-CM | POA: Diagnosis not present

## 2016-06-03 DIAGNOSIS — F17290 Nicotine dependence, other tobacco product, uncomplicated: Secondary | ICD-10-CM | POA: Diagnosis not present

## 2016-06-03 DIAGNOSIS — I639 Cerebral infarction, unspecified: Secondary | ICD-10-CM | POA: Diagnosis not present

## 2016-06-03 DIAGNOSIS — I10 Essential (primary) hypertension: Secondary | ICD-10-CM | POA: Diagnosis not present

## 2016-06-03 DIAGNOSIS — E785 Hyperlipidemia, unspecified: Secondary | ICD-10-CM | POA: Diagnosis not present

## 2016-06-12 NOTE — Progress Notes (Signed)
SUBJECTIVE Patient with a history of diabetes mellitus presents to office today complaining of elongated, thickened nails. Pain while ambulating in shoes. Patient is unable to trim their own nails.   Allergies  Allergen Reactions  . Pork-Derived Products Other (See Comments)    To keep blood pressure down    OBJECTIVE General Patient is awake, alert, and oriented x 3 and in no acute distress. Derm Skin is dry and supple bilateral. Negative open lesions or macerations. Remaining integument unremarkable. Nails are tender, long, thickened and dystrophic with subungual debris, consistent with onychomycosis, 1-5 bilateral. No signs of infection noted. Vasc  DP and PT pedal pulses palpable bilaterally. Temperature gradient within normal limits.  Neuro Epicritic and protective threshold sensation diminished bilaterally.  Musculoskeletal Exam No symptomatic pedal deformities noted bilateral. Muscular strength within normal limits.  ASSESSMENT 1. Diabetes Mellitus w/ peripheral neuropathy 2. Onychomycosis of nail due to dermatophyte bilateral 3. Pain in foot bilateral  PLAN OF CARE 1. Patient evaluated today. 2. Instructed to maintain good pedal hygiene and foot care. Stressed importance of controlling blood sugar.  3. Mechanical debridement of nails 1-5 bilaterally performed using a nail nipper. Filed with dremel without incident.  4. Return to clinic in 3 mos.     Edrick Kins, DPM [facility]  Dr. Edrick Kins, [address]                                        [place], [place] [address]                Office [phone]  Fax [phone]

## 2016-08-03 ENCOUNTER — Ambulatory Visit (INDEPENDENT_AMBULATORY_CARE_PROVIDER_SITE_OTHER): Payer: Medicare Other | Admitting: Nurse Practitioner

## 2016-08-03 ENCOUNTER — Other Ambulatory Visit (INDEPENDENT_AMBULATORY_CARE_PROVIDER_SITE_OTHER): Payer: Medicare Other

## 2016-08-03 ENCOUNTER — Encounter: Payer: Self-pay | Admitting: Nurse Practitioner

## 2016-08-03 ENCOUNTER — Ambulatory Visit (INDEPENDENT_AMBULATORY_CARE_PROVIDER_SITE_OTHER)
Admission: RE | Admit: 2016-08-03 | Discharge: 2016-08-03 | Disposition: A | Discharge status: Home / Self Care | Payer: Medicare Other | Source: Ambulatory Visit | Attending: Nurse Practitioner | Admitting: Nurse Practitioner

## 2016-08-03 VITALS — BP 136/84 | HR 89 | Temp 98.3°F

## 2016-08-03 DIAGNOSIS — R310 Gross hematuria: Secondary | ICD-10-CM

## 2016-08-03 DIAGNOSIS — I639 Cerebral infarction, unspecified: Secondary | ICD-10-CM | POA: Diagnosis not present

## 2016-08-03 DIAGNOSIS — N3001 Acute cystitis with hematuria: Secondary | ICD-10-CM | POA: Diagnosis not present

## 2016-08-03 DIAGNOSIS — R319 Hematuria, unspecified: Secondary | ICD-10-CM | POA: Diagnosis not present

## 2016-08-03 LAB — URINALYSIS, ROUTINE W REFLEX MICROSCOPIC
Bilirubin Urine: NEGATIVE
Ketones, ur: NEGATIVE
Nitrite: NEGATIVE
PH: 5.5 (ref 5.0–8.0)
SPECIFIC GRAVITY, URINE: 1.015 (ref 1.000–1.030)
TOTAL PROTEIN, URINE-UPE24: 30 — AB
URINE GLUCOSE: NEGATIVE
UROBILINOGEN UA: 0.2 (ref 0.0–1.0)

## 2016-08-03 NOTE — Progress Notes (Signed)
Subjective:  Patient ID: [name], female    DOB: [birth year]  Age: 71 y.o. MRN: [id number]  CC: Urinary Tract Infection (blood in urine for 3 days)   Urinary Tract Infection   This is a new problem. The current episode started in the past 7 days. The problem occurs every urination. The problem has been unchanged. The pain is at a severity of 0/10. The patient is experiencing no pain. There has been no fever. She is not sexually active. There is no history of pyelonephritis. Associated symptoms include hematuria. Pertinent negatives include no chills, discharge, flank pain, frequency, hesitancy, nausea, possible pregnancy, sweats, urgency or vomiting. She has tried increased fluids for the symptoms. There is no history of recurrent UTIs or urinary stasis.   INR has not been checked this month per son.  Outpatient Medications Prior to Visit  Medication Sig Dispense Refill  . atorvastatin (LIPITOR) 40 MG tablet Take 1 tablet (40 mg total) by mouth daily. 30 tablet 3  . baclofen (LIORESAL) 10 MG tablet Take 1 tablet (10 mg total) by mouth 3 (three) times daily. 90 tablet 11  . Ergocalciferol (VITAMIN D2) 400 units TABS Take 1 tablet by mouth daily.    Marland Kitchen escitalopram (LEXAPRO) 10 MG tablet Take 10 mg by mouth daily as needed (anxiety).     . gabapentin (NEURONTIN) 300 MG capsule Take 1 capsule (300 mg total) by mouth 3 (three) times daily. 90 capsule 11  . insulin glargine (LANTUS) 100 UNIT/ML injection Inject 12 Units into the skin at bedtime.     . metFORMIN (GLUCOPHAGE) 500 MG tablet Take 500 mg by mouth 2 (two) times daily with a meal.    . metoprolol succinate (TOPROL-XL) 25 MG 24 hr tablet Take 25 mg by mouth daily. Hold for SBP <110 or HR <60    . nitroGLYCERIN (NITROSTAT) 0.4 MG SL tablet Place 0.4 mg under the tongue every 5 (five) minutes as needed for chest pain (up to 3 doses.  If no relief call MD/NP).     Marland Kitchen traMADol (ULTRAM) 50 MG tablet Take 1-2 tablets (50-100 mg total) by  mouth every 8 (eight) hours as needed for moderate pain or severe pain. 180 tablet 2  . warfarin (COUMADIN) 5 MG tablet Take 5 mg by mouth daily.     . polyethylene glycol (MIRALAX / GLYCOLAX) packet Take 17 g by mouth daily. (Patient not taking: Reported on 08/03/2016) 14 each 1   No facility-administered medications prior to visit.     ROS See HPI  Objective:  BP 136/84   Pulse 89   Temp 98.3 F (36.8 C)   SpO2 95%   BP Readings from Last 3 Encounters:  08/03/16 136/84  01/20/16 (!) 102/58  12/23/15 120/86    Wt Readings from Last 3 Encounters:  01/15/16 140 lb (63.5 kg)  07/30/15 146 lb 8 oz (66.5 kg)  07/21/15 144 lb 12.8 oz (65.7 kg)    Physical Exam  Constitutional: No distress.  Cardiovascular: Normal rate.   Pulmonary/Chest: Effort normal.  Abdominal: Soft. Bowel sounds are normal. She exhibits no distension. There is no tenderness.  Genitourinary: There is no rash or tenderness on the right labia. There is no rash or tenderness on the left labia. No erythema or bleeding in the vagina. No vaginal discharge found.  Neurological: She is alert.  Skin: Skin is warm and dry. No rash noted.  Vitals reviewed.   Lab Results  Component Value Date  WBC 9.0 11/14/2015   HGB 12.6 11/14/2015   HCT 40.7 11/14/2015   PLT 214 11/14/2015   GLUCOSE 161 (H) 12/23/2015   CHOL 212 (H) 04/29/2015   TRIG 236 (H) 04/29/2015   HDL 44 04/29/2015   LDLCALC 121 (H) 04/29/2015   ALT 45 (H) 12/23/2015   AST 30 12/23/2015   NA 140 12/23/2015   K 3.9 12/23/2015   CL 104 12/23/2015   CREATININE 0.74 12/23/2015   BUN 13 12/23/2015   CO2 31 12/23/2015   INR 1.33 11/14/2015   HGBA1C 8.4 (H) 12/23/2015    No results found.  Assessment & Plan:   Kaleb was seen today for urinary tract infection.  Diagnoses and all orders for this visit:  Acute cystitis with hematuria -     ciprofloxacin (CIPRO) 250 MG tablet; Take 1 tablet (250 mg total) by mouth 2 (two) times daily. -      Urine culture; Future  Gross hematuria -     Urinalysis, Routine w reflex microscopic; Future -     DG Abd 1 View; Future   I am having Ms. Coldwell start on ciprofloxacin. I am also having her maintain her nitroGLYCERIN, atorvastatin, polyethylene glycol, warfarin, escitalopram, Vitamin D2, insulin glargine, metoprolol succinate, gabapentin, baclofen, metFORMIN, and traMADol.  Meds ordered this encounter  Medications  . ciprofloxacin (CIPRO) 250 MG tablet    Sig: Take 1 tablet (250 mg total) by mouth 2 (two) times daily.    Dispense:  14 tablet    Refill:  0    Order Specific Question:   Supervising Provider    Answer:   [place] Anger [[id number]]    Follow-up: Return if symptoms worsen or fail to improve.  Wilfred Lacy, NP

## 2016-08-03 NOTE — Progress Notes (Signed)
Pre visit review using our clinic review tool, if applicable. No additional management support is needed unless otherwise documented below in the visit note. 

## 2016-08-03 NOTE — Patient Instructions (Addendum)
Unable to provide urine sample in office today. Provided with specimen cup and hat to take home.  Return urine sample to lab ASAP. Encourage adequate oral hydration.  Go to basement for KUB.  Contact cardiologist for INR check today

## 2016-08-04 MED ORDER — CIPROFLOXACIN HCL 250 MG PO TABS: 250.0000 mg | ORAL_TABLET | Freq: Two times a day (BID) | ORAL | 0 refills | Status: DC

## 2016-08-25 ENCOUNTER — Ambulatory Visit: Payer: BC Managed Care – PPO | Admitting: Podiatry

## 2016-09-20 DIAGNOSIS — F17290 Nicotine dependence, other tobacco product, uncomplicated: Secondary | ICD-10-CM | POA: Diagnosis not present

## 2016-09-20 DIAGNOSIS — I639 Cerebral infarction, unspecified: Secondary | ICD-10-CM | POA: Diagnosis not present

## 2016-09-20 DIAGNOSIS — I10 Essential (primary) hypertension: Secondary | ICD-10-CM | POA: Diagnosis not present

## 2016-09-20 DIAGNOSIS — I2510 Atherosclerotic heart disease of native coronary artery without angina pectoris: Secondary | ICD-10-CM | POA: Diagnosis not present

## 2016-09-20 DIAGNOSIS — E785 Hyperlipidemia, unspecified: Secondary | ICD-10-CM | POA: Diagnosis not present

## 2016-09-20 DIAGNOSIS — E119 Type 2 diabetes mellitus without complications: Secondary | ICD-10-CM | POA: Diagnosis not present

## 2016-10-06 DIAGNOSIS — E119 Type 2 diabetes mellitus without complications: Secondary | ICD-10-CM | POA: Diagnosis not present

## 2016-10-06 DIAGNOSIS — E785 Hyperlipidemia, unspecified: Secondary | ICD-10-CM | POA: Diagnosis not present

## 2016-10-06 DIAGNOSIS — I639 Cerebral infarction, unspecified: Secondary | ICD-10-CM | POA: Diagnosis not present

## 2016-10-06 DIAGNOSIS — I10 Essential (primary) hypertension: Secondary | ICD-10-CM | POA: Diagnosis not present

## 2016-10-07 ENCOUNTER — Telehealth: Payer: Self-pay | Admitting: Neurology

## 2016-10-07 ENCOUNTER — Encounter: Payer: Self-pay | Admitting: Neurology

## 2016-10-07 ENCOUNTER — Encounter (INDEPENDENT_AMBULATORY_CARE_PROVIDER_SITE_OTHER): Payer: Self-pay

## 2016-10-07 ENCOUNTER — Ambulatory Visit (INDEPENDENT_AMBULATORY_CARE_PROVIDER_SITE_OTHER): Payer: Medicare Other | Admitting: Neurology

## 2016-10-07 VITALS — BP 132/82 | HR 75

## 2016-10-07 DIAGNOSIS — F05 Delirium due to known physiological condition: Secondary | ICD-10-CM | POA: Diagnosis not present

## 2016-10-07 DIAGNOSIS — G8111 Spastic hemiplegia affecting right dominant side: Secondary | ICD-10-CM

## 2016-10-07 MED ORDER — DIVALPROEX SODIUM ER 500 MG PO TB24: 500.0000 mg | ORAL_TABLET | Freq: Every day | ORAL | 11 refills | Status: DC

## 2016-10-07 NOTE — Progress Notes (Signed)
No chief complaint on file.     PATIENT: [name] DOB: [birth year]   HISTORICAL  [name] 71 years old right-handed female, follow-up her most recent stroke in April 28 2015  She had a history of hypertension, diabetes, hyperlipidemia, not on any anticoagulation treatment prior to hospital admission.  In April 27 2016, she presenting with acute onset slurred speech, right leg and arm weakness, I personally reviewed MRI of the brain showed evidence of left ACA infarction, with left A2 occlusion,  She was found to have patent foramina ovale by TEE in October 2016, was put on Coumadin as stroke prevention  She also suffered right hip fracture require surgery in June 12 2015, she is now at [facility], will be back home with her son in next few days  She also complains of few days' history of severe right shoulder pain, right shoulder tightness, limited range of motion. I reviewed laboratory, LDL 121, A1c 11.6,  Repeat Lab in 12 2016, Hg 9.5, A1c 7.0.  UPDATE October 28 2015: Patient was taken to the emergency room in October 24 2015, she was noted to have increased confusion, facial drooping, increased right shoulder pain, her son [name] who brought her to clinic today, stated when she has severe right shoulder pain, she often become mild confused, usually improved after she taking baclofen, gabapentin tramadol, but the medications over and put her into sleep, sometimes she has intermittent muscle spasm at right arm, right leg,  I personally reviewed CAT scan of the brain without contrast October 24 2015, evidence of left ACA territory stroke at the left frontal lobe, there is no acute lesion  I reviewed laboratory evaluation, normal CMP with exception of elevated glucose 290, normal CBC, INR was 2.5, troponin was negative  Before stroke, she was highly functional," 100%", now she is no longer ambulatory, complains constant right shoulder pain, has no meaningful movement  of right arm or leg, significant aphasia  X-ray of her right shoulder in January 2017 showed no significant abnormality  UPDATE October 07 2016: She lost follow-up since last visit in April 2017, she is brought in by her son [name] at today's clinical visit, she continued to complains of right shoulder pain, limited range of motion of right shoulder, is no longer ambulatory, she has memory loss, but overall stable, able to carry on normal conversation, able to feed herself,  She was given a dose of tramadol 50 mg, gabapentin 300 mg, and baclofen 10 mg prior to her office visit to relax her right shoulder, during the conversation, she was noted to have transient confusion, could not carry on the conversation, then followed by a more lucid period of time.  I am concerned that the observe episode could due to partial seizure. Her son did not notice any seizure-like episode at home.  Previous EEG in May 2017 showed mild background slowing, there was no evidence of epileptiform discharge.  I reviewed laboratory evaluation in 2017 A1c 8.4, frequent UTIs, INR 1.33-2.5, she supposed to take Coumadin, CMP showed elevated glucose 194, creatinine 0.91,  REVIEW OF SYSTEMS: Full 14 system review of systems performed and notable only for right shoulder pain  ALLERGIES: Allergies  Allergen Reactions  . Pork-Derived Products Other (See Comments)    To keep blood pressure down    HOME MEDICATIONS: Current Outpatient Prescriptions  Medication Sig Dispense Refill  . atorvastatin (LIPITOR) 40 MG tablet Take 1 tablet (40 mg total) by mouth daily. 30 tablet 3  .  baclofen (LIORESAL) 10 MG tablet Take 1 tablet (10 mg total) by mouth 3 (three) times daily. 90 tablet 11  . ciprofloxacin (CIPRO) 250 MG tablet Take 1 tablet (250 mg total) by mouth 2 (two) times daily. 14 tablet 0  . Ergocalciferol (VITAMIN D2) 400 units TABS Take 1 tablet by mouth daily.    Marland Kitchen escitalopram (LEXAPRO) 10 MG tablet Take 10 mg by mouth  daily as needed (anxiety).     . gabapentin (NEURONTIN) 300 MG capsule Take 1 capsule (300 mg total) by mouth 3 (three) times daily. 90 capsule 11  . insulin glargine (LANTUS) 100 UNIT/ML injection Inject 12 Units into the skin at bedtime.     . metFORMIN (GLUCOPHAGE) 500 MG tablet Take 500 mg by mouth 2 (two) times daily with a meal.    . metoprolol succinate (TOPROL-XL) 25 MG 24 hr tablet Take 25 mg by mouth daily. Hold for SBP <110 or HR <60    . nitroGLYCERIN (NITROSTAT) 0.4 MG SL tablet Place 0.4 mg under the tongue every 5 (five) minutes as needed for chest pain (up to 3 doses.  If no relief call MD/NP).     Marland Kitchen polyethylene glycol (MIRALAX / GLYCOLAX) packet Take 17 g by mouth daily. (Patient not taking: Reported on 08/03/2016) 14 each 1  . traMADol (ULTRAM) 50 MG tablet Take 1-2 tablets (50-100 mg total) by mouth every 8 (eight) hours as needed for moderate pain or severe pain. 180 tablet 2  . warfarin (COUMADIN) 5 MG tablet Take 5 mg by mouth daily.      No current facility-administered medications for this visit.     PAST MEDICAL HISTORY: Past Medical History:  Diagnosis Date  . Acute blood loss anemia   . CHF (congestive heart failure) ([facility])    EF 59-16%, grade 1 diastolic dysfunction per echo 04/2015  . 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 CVA (cerebral infarction) 04/2015   Started Plavix 04/2015  . Depression   . Diabetes ([place]) 2016   Type II. On insulin  . Enchondroma of bone 2011   left femur.   . Fracture, intertrochanteric, right femur ([place])   . History of lower GI bleeding   . Hyperlipidemia   . Hypertension   . Patent foramen ovale 04/2015   Started on warfarin 04/2015  . Protein calorie malnutrition ([place])   . Stercoral ulcer of rectum 05/16/2015    PAST SURGICAL HISTORY: Past Surgical History:  Procedure Laterality Date  . CARDIAC SURGERY     Cath without stent  . COLONOSCOPY N/A 05/15/2015    Procedure: COLONOSCOPY;  Surgeon: Mauri Pole, MD;  Location: [facility] ENDOSCOPY;  Service: Endoscopy;  Laterality: N/A;  . FLEXIBLE SIGMOIDOSCOPY N/A 05/15/2015   Procedure: FLEXIBLE SIGMOIDOSCOPY;  Surgeon: Mauri Pole, MD;  Location: [place] ENDOSCOPY;  Service: Endoscopy;  Laterality: N/A;  at bedside  . INTRAMEDULLARY (IM) NAIL INTERTROCHANTERIC Right 06/12/2015   Procedure: INTRAMEDULLARY (IM) NAIL RIGHT HIP;  Surgeon: Renette Butters, MD;  Location: [place];  Service: Orthopedics;  Laterality: Right;  . TEE WITHOUT CARDIOVERSION N/A 05/05/2015   Procedure: TRANSESOPHAGEAL ECHOCARDIOGRAM (TEE);  Surgeon: Dixie Dials, MD;  Location: [facility] ENDOSCOPY;  Service: Cardiovascular;  Laterality: N/A;    FAMILY HISTORY: Family History  Problem Relation Age of Onset  . Hypertension Mother   . Heart failure Mother   . Hyperlipidemia Mother   . Heart attack Father     SOCIAL HISTORY:  Social History   Social History  . Marital status: Married    Spouse name: N/A  . Number of children: 6  . Years of education: 71   Occupational History  . Retired    Social History Main Topics  . Smoking status: Former Smoker    Types: Cigarettes    Quit date: 04/05/2015  . Smokeless tobacco: Never Used     Comment: Quit 04/28/15  . Alcohol use No  . Drug use: No  . Sexual activity: Not on file   Other Topics Concern  . Not on file   Social History Narrative   She will be living with her two sons.\   Right-handed.   No caffeine use.     PHYSICAL EXAM   There were no vitals filed for this visit.  Not recorded      There is no height or weight on file to calculate BMI.  PHYSICAL EXAMNIATION:  Gen: NAD, conversant, well nourised, obese, well groomed                     Cardiovascular: Regular rate rhythm, no peripheral edema, warm, nontender. Eyes: Conjunctivae clear without exudates or hemorrhage Neck: Supple, no carotid bruise. Pulmonary: Clear to auscultation bilaterally    NEUROLOGICAL EXAM:  MENTAL STATUS: Speech/cognition: She has expressive aphasia, following commands, able to onset question by yes or no, not intelligible speech,   CRANIAL NERVES: CN II: Right visual field deficit on double spontaneous stimulation, Pupils are round equal and briskly reactive to light. CN III, IV, VI: extraocular movement are normal. No ptosis. CN V: Facial sensation is intact to pinprick in all 3 divisions bilaterally. Corneal responses are intact.  CN VII: Face is symmetric with normal eye closure and smile. CN VIII: Hearing is normal to rubbing fingers CN IX, X: Palate elevates symmetrically. Phonation is normal. CN XI: Head turning and shoulder shrug are intact CN XII: Tongue is midline with normal movements and no atrophy.  MOTOR: She only has trace movement of right lower extremity, complains of significant right shoulder pain, limited range of motion of right shoulder, right proximal upper extremity motor strength was 3 and distal strength was 4  REFLEXES: Hyperreflexia on the right arm, right leg, plantar responses are flexor bilaterally  SENSORY: Intact to light touch, pinprick and vibratory sensation  COORDINATION: Rapid alternating movements and fine finger movements are intact. There is no dysmetria on finger-to-nose and heel-knee-shin.    GAIT/STANCE: She has difficulty bearing weight even with assistant   DIAGNOSTIC DATA (LABS, IMAGING, TESTING) - I reviewed patient records, labs, notes, testing and imaging myself where available.   ASSESSMENT AND PLAN  MALONIE TATUM is a 71 y.o. female   Left ACA stroke, left A2 occlusion with residual spastic right hemiparesis, right shoulder pain  She has evidence of patent foraminal ovale is taking Coumadin  Initiate the preauthorization process for EMG guided botulism toxin injection for her right shoulder pain  Refer her to physical therapy, speech therapy  Acute onset of confusion  She has  evidence of left frontal encephalomalacia, at the high risk for developing complex partial seizure  Proceed with EEG  Add on Depakote ER 500mg  qhs.  Stop Lexapro 10 mg daily  Vitamin D deficiency  Continue vitamin D supplement   Marcial Pacas, M.D. Ph.D.  [facility] [address] [place], [place] [address] Ph: [phone] Fax: [phone]  CC: Referring Provider

## 2016-10-07 NOTE — Telephone Encounter (Signed)
Per Dr. Krista Blue, pt should return in 4 weeks for emg guided xeomin inj.

## 2016-10-12 ENCOUNTER — Telehealth: Payer: Self-pay | Admitting: Family

## 2016-10-12 ENCOUNTER — Ambulatory Visit (INDEPENDENT_AMBULATORY_CARE_PROVIDER_SITE_OTHER): Payer: Medicare Other | Admitting: Nurse Practitioner

## 2016-10-12 ENCOUNTER — Ambulatory Visit: Payer: BC Managed Care – PPO

## 2016-10-12 ENCOUNTER — Encounter: Payer: Self-pay | Admitting: Nurse Practitioner

## 2016-10-12 VITALS — BP 124/80 | HR 62 | Temp 97.7°F | Ht 66.0 in | Wt 154.0 lb

## 2016-10-12 DIAGNOSIS — T148XXA Other injury of unspecified body region, initial encounter: Secondary | ICD-10-CM | POA: Diagnosis not present

## 2016-10-12 DIAGNOSIS — L089 Local infection of the skin and subcutaneous tissue, unspecified: Secondary | ICD-10-CM | POA: Diagnosis not present

## 2016-10-12 DIAGNOSIS — N3001 Acute cystitis with hematuria: Secondary | ICD-10-CM | POA: Diagnosis not present

## 2016-10-12 MED ORDER — NITROFURANTOIN MONOHYD MACRO 100 MG PO CAPS: 100.0000 mg | ORAL_CAPSULE | Freq: Two times a day (BID) | ORAL | 0 refills | Status: DC

## 2016-10-12 MED ORDER — MUPIROCIN 2 % EX OINT: 1.0000 "application " | TOPICAL_OINTMENT | Freq: Two times a day (BID) | CUTANEOUS | 0 refills | Status: DC

## 2016-10-12 NOTE — Progress Notes (Signed)
Subjective:  Patient ID: [name], female    DOB: [birth year]  Age: 71 y.o. MRN: [id number]  CC: Urinary Tract Infection (blood in urine/frequent urinate--2 days. )  Urinary Frequency   This is a new problem. The current episode started in the past 7 days. The problem occurs every urination. The problem has been unchanged. The quality of the pain is described as burning. There has been no fever. She is not sexually active. There is no history of pyelonephritis. Associated symptoms include frequency, hematuria and urgency. Pertinent negatives include no chills, discharge, flank pain, hesitancy, nausea, possible pregnancy, sweats or vomiting. She has tried increased fluids for the symptoms. The treatment provided no relief.    Outpatient Medications Prior to Visit  Medication Sig Dispense Refill  . atorvastatin (LIPITOR) 40 MG tablet Take 1 tablet (40 mg total) by mouth daily. 30 tablet 3  . baclofen (LIORESAL) 10 MG tablet Take 1 tablet (10 mg total) by mouth 3 (three) times daily. 90 tablet 11  . gabapentin (NEURONTIN) 300 MG capsule Take 1 capsule (300 mg total) by mouth 3 (three) times daily. 90 capsule 11  . insulin glargine (LANTUS) 100 UNIT/ML injection Inject 12 Units into the skin at bedtime.     . metFORMIN (GLUCOPHAGE) 500 MG tablet Take 500 mg by mouth 2 (two) times daily with a meal.    . metoprolol succinate (TOPROL-XL) 25 MG 24 hr tablet Take 25 mg by mouth daily. Hold for SBP <110 or HR <60    . nitroGLYCERIN (NITROSTAT) 0.4 MG SL tablet Place 0.4 mg under the tongue every 5 (five) minutes as needed for chest pain (up to 3 doses.  If no relief call MD/NP).     Marland Kitchen traMADol (ULTRAM) 50 MG tablet Take 1-2 tablets (50-100 mg total) by mouth every 8 (eight) hours as needed for moderate pain or severe pain. 180 tablet 2  . warfarin (COUMADIN) 5 MG tablet Take 5 mg by mouth daily.     . Ergocalciferol (VITAMIN D2) 400 units TABS Take 1 tablet by mouth daily.    . ciprofloxacin  (CIPRO) 250 MG tablet Take 1 tablet (250 mg total) by mouth 2 (two) times daily. (Patient not taking: Reported on 10/12/2016) 14 tablet 0  . divalproex (DEPAKOTE ER) 500 MG 24 hr tablet Take 1 tablet (500 mg total) by mouth at bedtime. (Patient not taking: Reported on 10/12/2016) 30 tablet 11  . polyethylene glycol (MIRALAX / GLYCOLAX) packet Take 17 g by mouth daily. (Patient not taking: Reported on 10/12/2016) 14 each 1   No facility-administered medications prior to visit.     ROS See HPI  Objective:  BP 124/80   Pulse 62   Temp 97.7 F (36.5 C)   Ht 5\' 6"  (1.676 m)   Wt 154 lb (69.9 kg) Comment: with wheelchair  SpO2 98%   BMI 24.86 kg/m   BP Readings from Last 3 Encounters:  10/12/16 124/80  10/07/16 132/82  08/03/16 136/84    Wt Readings from Last 3 Encounters:  10/12/16 154 lb (69.9 kg)  01/15/16 140 lb (63.5 kg)  07/30/15 146 lb 8 oz (66.5 kg)    Physical Exam  Constitutional: She is oriented to person, place, and time.  Cardiovascular: Normal rate.   Pulmonary/Chest: Effort normal.  Abdominal: Soft. Bowel sounds are normal. She exhibits no distension.  Neurological: She is alert and oriented to person, place, and time.  Skin: Skin is warm and dry.  Vitals reviewed.  Lab Results  Component Value Date   WBC 9.0 11/14/2015   HGB 12.6 11/14/2015   HCT 40.7 11/14/2015   PLT 214 11/14/2015   GLUCOSE 161 (H) 12/23/2015   CHOL 212 (H) 04/29/2015   TRIG 236 (H) 04/29/2015   HDL 44 04/29/2015   LDLCALC 121 (H) 04/29/2015   ALT 45 (H) 12/23/2015   AST 30 12/23/2015   NA 140 12/23/2015   K 3.9 12/23/2015   CL 104 12/23/2015   CREATININE 0.74 12/23/2015   BUN 13 12/23/2015   CO2 31 12/23/2015   INR 1.33 11/14/2015   HGBA1C 8.4 (H) 12/23/2015    Dg Abd 1 View  Result Date: 08/03/2016 CLINICAL DATA:  Hematuria.  Right abdominal pain EXAM: ABDOMEN - 1 VIEW COMPARISON:  None. FINDINGS: Three calcifications left of L4 and L5 which could be ovarian vein  phleboliths or ureteral calculi. There are bilateral pelvis rounded calcifications and arterial calcifications. No definitive stone over the renal shadows, but limited by degree of overlapping stool, which is moderate volume. No concerning mass effect or gas collection. Osteopenia. Degenerative changes and mild S-shaped scoliosis. IMPRESSION: Phleboliths over the bilateral pelvis and left flank. In this setting, cannot exclude superimposed ureteral calculus. No stone identified over the renal fossae. Electronically Signed   By: Monte Fantasia M.D.   On: 08/03/2016 09:23    Assessment & Plan:   Keymiah was seen today for urinary tract infection.  Diagnoses and all orders for this visit:  Acute cystitis with hematuria -     Urinalysis w microscopic + reflex cultur; Future -     Discontinue: nitrofurantoin, macrocrystal-monohydrate, (MACROBID) 100 MG capsule; Take 1 capsule (100 mg total) by mouth 2 (two) times daily.  Abrasion of skin with infection -     mupirocin ointment (BACTROBAN) 2 %; Place 1 application into the nose 2 (two) times daily.   I have discontinued Ms. Mcelwee's polyethylene glycol, ciprofloxacin, and divalproex. I am also having her start on mupirocin ointment. Additionally, I am having her maintain her nitroGLYCERIN, atorvastatin, warfarin, Vitamin D2, insulin glargine, metoprolol succinate, gabapentin, baclofen, metFORMIN, and traMADol.  Meds ordered this encounter  Medications  . DISCONTD: nitrofurantoin, macrocrystal-monohydrate, (MACROBID) 100 MG capsule    Sig: Take 1 capsule (100 mg total) by mouth 2 (two) times daily.    Dispense:  6 capsule    Refill:  0    Order Specific Question:   Supervising Provider    Answer:   Cassandria Anger [[id number]]  . mupirocin ointment (BACTROBAN) 2 %    Sig: Place 1 application into the nose 2 (two) times daily.    Dispense:  15 g    Refill:  0    Order Specific Question:   Supervising Provider    Answer:   Cassandria Anger  [[id number]]    Follow-up: Return if symptoms worsen or fail to improve.  Wilfred Lacy, NP

## 2016-10-12 NOTE — Progress Notes (Signed)
Pre visit review using our clinic review tool, if applicable. No additional management support is needed unless otherwise documented below in the visit note. 

## 2016-10-12 NOTE — Telephone Encounter (Signed)
Pt request for handicap placard to fill out, pt stated she forgot to come in and get it when we told her it was ready last year. Please advise. Her son is coming to to lab tomorrow and hopping to get it then.

## 2016-10-12 NOTE — Patient Instructions (Signed)
Return urine to lab ASAP.  When you complete oral abx, wait 2days then return another urine sample to lab for repeat urinalysis.   Urinary Tract Infection, Adult A urinary tract infection (UTI) is an infection of any part of the urinary tract, which includes the kidneys, ureters, bladder, and urethra. These organs make, store, and get rid of urine in the body. UTI can be a bladder infection (cystitis) or kidney infection (pyelonephritis). What are the causes? This infection may be caused by fungi, viruses, or bacteria. Bacteria are the most common cause of UTIs. This condition can also be caused by repeated incomplete emptying of the bladder during urination. What increases the risk? This condition is more likely to develop if:  You ignore your need to urinate or hold urine for long periods of time.  You do not empty your bladder completely during urination.  You wipe back to front after urinating or having a bowel movement, if you are female.  You are uncircumcised, if you are female.  You are constipated.  You have a urinary catheter that stays in place (indwelling).  You have a weak defense (immune) system.  You have a medical condition that affects your bowels, kidneys, or bladder.  You have diabetes.  You take antibiotic medicines frequently or for long periods of time, and the antibiotics no longer work well against certain types of infections (antibiotic resistance).  You take medicines that irritate your urinary tract.  You are exposed to chemicals that irritate your urinary tract.  You are female. What are the signs or symptoms? Symptoms of this condition include:  Fever.  Frequent urination or passing small amounts of urine frequently.  Needing to urinate urgently.  Pain or burning with urination.  Urine that smells bad or unusual.  Cloudy urine.  Pain in the lower abdomen or back.  Trouble urinating.  Blood in the urine.  Vomiting or being less  hungry than normal.  Diarrhea or abdominal pain.  Vaginal discharge, if you are female. How is this diagnosed? This condition is diagnosed with a medical history and physical exam. You will also need to provide a urine sample to test your urine. Other tests may be done, including:  Blood tests.  Sexually transmitted disease (STD) testing. If you have had more than one UTI, a cystoscopy or imaging studies may be done to determine the cause of the infections. How is this treated? Treatment for this condition often includes a combination of two or more of the following:  Antibiotic medicine.  Other medicines to treat less common causes of UTI.  Over-the-counter medicines to treat pain.  Drinking enough water to stay hydrated. Follow these instructions at home:  Take over-the-counter and prescription medicines only as told by your health care provider.  If you were prescribed an antibiotic, take it as told by your health care provider. Do not stop taking the antibiotic even if you start to feel better.  Avoid alcohol, caffeine, tea, and carbonated beverages. They can irritate your bladder.  Drink enough fluid to keep your urine clear or pale yellow.  Keep all follow-up visits as told by your health care provider. This is important.  Make sure to:  Empty your bladder often and completely. Do not hold urine for long periods of time.  Empty your bladder before and after sex.  Wipe from front to back after a bowel movement if you are female. Use each tissue one time when you wipe. Contact a health care provider if:  You have back pain.  You have a fever.  You feel nauseous or vomit.  Your symptoms do not get better after 3 days.  Your symptoms go away and then return. Get help right away if:  You have severe back pain or lower abdominal pain.  You are vomiting and cannot keep down any medicines or water. This information is not intended to replace advice given to you  by your health care provider. Make sure you discuss any questions you have with your health care provider. Document Released: 03/31/2005 Document Revised: 12/03/2015 Document Reviewed: 05/12/2015 Elsevier Interactive Patient Education  2017 [employer].

## 2016-10-13 ENCOUNTER — Encounter: Payer: Self-pay | Admitting: Neurology

## 2016-10-13 ENCOUNTER — Other Ambulatory Visit: Payer: Medicare Other

## 2016-10-13 DIAGNOSIS — N3001 Acute cystitis with hematuria: Secondary | ICD-10-CM

## 2016-10-13 NOTE — Telephone Encounter (Signed)
Ok to complete based on neurology note.

## 2016-10-13 NOTE — Telephone Encounter (Signed)
Please advise as you have not physically seen patient since 01/2016 she has seen [place] since.

## 2016-10-14 LAB — URINALYSIS W MICROSCOPIC + REFLEX CULTURE

## 2016-10-15 ENCOUNTER — Telehealth: Payer: Self-pay | Admitting: Family

## 2016-10-15 DIAGNOSIS — N3001 Acute cystitis with hematuria: Secondary | ICD-10-CM

## 2016-10-15 MED ORDER — NITROFURANTOIN MONOHYD MACRO 100 MG PO CAPS: 100.0000 mg | ORAL_CAPSULE | Freq: Two times a day (BID) | ORAL | 0 refills | Status: AC

## 2016-10-15 NOTE — Telephone Encounter (Signed)
Patients son is calling back saying he wants to know if the abx can be extended until we know for sure if she has a uti or not. Please advise.

## 2016-10-15 NOTE — Telephone Encounter (Signed)
Pt son called wanting results I informed him the lab rejected them due to a unclean container, he will be coming to get a new cup on Monday or Tuesday of next week , would like to know how to keep the container clean and what he needs to do.

## 2016-10-15 NOTE — Telephone Encounter (Signed)
No, I will need to urinalysis prior to extending oral abx.

## 2016-10-15 NOTE — Telephone Encounter (Signed)
Please advise 

## 2016-10-15 NOTE — Telephone Encounter (Signed)
rx sent to [facility], [name], son aware and he knows to bring urine to the lab [place]

## 2016-10-15 NOTE — Telephone Encounter (Signed)
Patient saw Baldo Ash for this issue.

## 2016-10-18 NOTE — Telephone Encounter (Signed)
LVM letting pt know that handicap placard is ready for pick up at the front desk.

## 2016-10-20 ENCOUNTER — Telehealth: Payer: Self-pay

## 2016-10-20 ENCOUNTER — Ambulatory Visit (INDEPENDENT_AMBULATORY_CARE_PROVIDER_SITE_OTHER): Payer: Medicare Other | Admitting: Neurology

## 2016-10-20 ENCOUNTER — Ambulatory Visit: Payer: Medicare Other

## 2016-10-20 ENCOUNTER — Ambulatory Visit: Payer: Medicare Other | Attending: Neurology

## 2016-10-20 ENCOUNTER — Other Ambulatory Visit: Payer: Medicare Other

## 2016-10-20 DIAGNOSIS — R4701 Aphasia: Secondary | ICD-10-CM | POA: Diagnosis not present

## 2016-10-20 DIAGNOSIS — F05 Delirium due to known physiological condition: Secondary | ICD-10-CM

## 2016-10-20 DIAGNOSIS — N3001 Acute cystitis with hematuria: Secondary | ICD-10-CM | POA: Diagnosis not present

## 2016-10-20 DIAGNOSIS — R482 Apraxia: Secondary | ICD-10-CM | POA: Diagnosis not present

## 2016-10-20 DIAGNOSIS — R410 Disorientation, unspecified: Secondary | ICD-10-CM

## 2016-10-20 DIAGNOSIS — I69351 Hemiplegia and hemiparesis following cerebral infarction affecting right dominant side: Secondary | ICD-10-CM

## 2016-10-20 DIAGNOSIS — R2689 Other abnormalities of gait and mobility: Secondary | ICD-10-CM | POA: Diagnosis not present

## 2016-10-20 DIAGNOSIS — R2681 Unsteadiness on feet: Secondary | ICD-10-CM

## 2016-10-20 DIAGNOSIS — I639 Cerebral infarction, unspecified: Secondary | ICD-10-CM

## 2016-10-20 DIAGNOSIS — G8111 Spastic hemiplegia affecting right dominant side: Secondary | ICD-10-CM

## 2016-10-20 NOTE — Therapy (Signed)
[facility] [address] [place], [place], [address] Phone: [phone]   Fax:  [phone]  Physical Therapy Evaluation  Patient Details  Name: [name] MRN: [id number] Date of Birth: 10/28/1945 Referring Provider: Dr. Krista Blue  Encounter Date: 10/20/2016      PT End of Session - 10/20/16 1557    Visit Number 1   Number of Visits 17   Date for PT Re-Evaluation 12/19/16   Authorization Type G-CODE AND PROGRESS NOTE EVERY 10TH VISIT. Medicare and BCBS 2nd   PT Start Time 1149   PT Stop Time 1235   PT Time Calculation (min) 46 min   Equipment Utilized During Treatment Gait belt   Activity Tolerance Patient limited by pain  R shoulder pain      Past Medical History:  Diagnosis Date  . Acute blood loss anemia   . CHF (congestive heart failure) (HCC)    EF 92-33%, grade 1 diastolic dysfunction per echo 04/2015  . 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 CVA (cerebral infarction) 04/2015   Started Plavix 04/2015  . Depression   . Diabetes ([place]) 2016   Type II. On insulin  . Enchondroma of bone 2011   left femur.   . Fracture, intertrochanteric, right femur ([place])   . History of lower GI bleeding   . Hyperlipidemia   . Hypertension   . Patent foramen ovale 04/2015   Started on warfarin 04/2015  . Protein calorie malnutrition ([place])   . Stercoral ulcer of rectum 05/16/2015    Past Surgical History:  Procedure Laterality Date  . CARDIAC SURGERY     Cath without stent  . COLONOSCOPY N/A 05/15/2015   Procedure: COLONOSCOPY;  Surgeon: Mauri Pole, MD;  Location: [facility] ENDOSCOPY;  Service: Endoscopy;  Laterality: N/A;  . FLEXIBLE SIGMOIDOSCOPY N/A 05/15/2015   Procedure: FLEXIBLE SIGMOIDOSCOPY;  Surgeon: Mauri Pole, MD;  Location: [facility] ENDOSCOPY;  Service: Endoscopy;  Laterality: N/A;  at bedside  . INTRAMEDULLARY (IM) NAIL INTERTROCHANTERIC  Right 06/12/2015   Procedure: INTRAMEDULLARY (IM) NAIL RIGHT HIP;  Surgeon: Renette Butters, MD;  Location: [facility];  Service: Orthopedics;  Laterality: Right;  . TEE WITHOUT CARDIOVERSION N/A 05/05/2015   Procedure: TRANSESOPHAGEAL ECHOCARDIOGRAM (TEE);  Surgeon: Dixie Dials, MD;  Location: [facility] ENDOSCOPY;  Service: Cardiovascular;  Laterality: N/A;    There were no vitals filed for this visit.       Subjective Assessment - 10/20/16 1202    Subjective Pt's son, [name], provided hx as pt with hx of aphasia.  Discharged home from SNF in 07/2015 and began HHPT in 08/2015, and had HHPT until 04/2016. Pt was amb. with RW while participating in PT but now needs assistance with txfs and amb., she has not done much since PT ceased. Pt recently diagnosed with acute confusional state by MD.  Pt is able to feed herself but needs help with food prep, bathing and dressing.    Pertinent History HTN, DM, CHF, CVA, hx of GI bleeds, acute confusional state, aphasia, chronic R shoulder pain (and L shoulder pain starting), anemia, HLD, patent foramen ovale, hx of fall and R hip fracture in 2016   Patient Stated Goals Walk and txf IND or with minor assistance   Currently in Pain? Yes   Pain Score 5    Pain Location Shoulder   Pain Orientation Left;Right  R>L   Pain Descriptors / Indicators Pins and needles  Pain Type Chronic pain   Pain Onset More than a month ago   Pain Frequency Constant   Aggravating Factors  moving shoulders   Pain Relieving Factors Tramadol and gabapentin            [facility] PT Assessment - 10/20/16 1214      Assessment   Medical Diagnosis R spastic hemiparesis, confusional state   Referring Provider Dr. Krista Blue   Onset Date/Surgical Date 04/21/16  since pt last had HHPT, CVA in 2016   Hand Dominance Right   Prior Therapy HHPT and SNF     Precautions   Precautions Fall     Restrictions   Weight Bearing Restrictions No     Balance Screen   Has the patient fallen in the past 6  months No   Has the patient had a decrease in activity level because of a fear of falling?  Yes   Is the patient reluctant to leave their home because of a fear of falling?  No     Home Environment   Living Environment Private residence   Living Arrangements Children  Terry-son; and pt's other son/sister will help   Available Help at Discharge Family   Type of [place] Access Level entry   [place] Two level;Able to live on main level with bedroom/bathroom   Alternate Level Stairs-Number of Steps pt does not use steps   Home Equipment Wheelchair - manual;Walker - 2 wheels;Shower seat     Prior Function   Level of Independence Independent  IND prior to CVA   Vocation Retired   Leisure watch TV, talk on the phone, visiting family     Cognition   Overall Cognitive Status History of cognitive impairments - at baseline     Observation/Other Assessments   Focus on Therapeutic Outcomes (FOTO)  SIS-mobility: 22.2%, with scores closer to 100% indicating pt's perceives less of an impact from the stroke on mobility.      Sensation   Light Touch Appears Intact   Additional Comments pt reports pins/needles sensation in R shoulder but denied changes in light touch.     Coordination   Gross Motor Movements are Fluid and Coordinated No   Fine Motor Movements are Fluid and Coordinated No   Coordination and Movement Description Dense R sided spastic hemiparesis affecting ability to perform coordinated movements     Posture/Postural Control   Posture/Postural Control Postural limitations   Postural Limitations Rounded Shoulders;Posterior pelvic tilt     Tone   Assessment Location Right Lower Extremity;Left Lower Extremity     ROM / Strength   AROM / PROM / Strength AROM;Strength     AROM   Overall AROM  Deficits   Overall AROM Comments Pt able to perform L UE movements under 90 degrees, attempt at overhead movements caused incr. in L shoulder pain. LLE AROM WFL. RLE limited  2/2 weakness and incr. tone (unable to perform hip flexion or knee ext in seated position).     Strength   Overall Strength Deficits   Overall Strength Comments Unable to formally assess LUE strength 2/2 L shoulder pain. LLE: grossly 4/5. RLE: pt unable to perform hip flexion or knee ext (0/5), 2/5 R ankle DF and 1/5 knee flex. Pt was able to perform hip ext. in standing to shift R LE under BOS. Strength deficits hard to determine, as pt also had difficulty following commands 2/2 hx of cognitive impairments.      Transfers  Transfers Sit to Stand;Stand to Sit   Sit to Stand 1: +2 Total assist;With upper extremity assist  from w/c   Sit to Stand Details Tactile cues for initiation;Tactile cues for sequencing;Tactile cues for weight shifting;Tactile cues for posture;Tactile cues for placement;Tactile cues for weight beaing;Verbal cues for sequencing;Verbal cues for technique   Sit to Stand Details (indicate cue type and reason) pt required +2 assist 2/2 weakness,, incr. tone and to ensure safety.   Stand to Sit 1: +2 Total assist  manual w/c   Stand to Sit Details (indicate cue type and reason) Verbal cues for sequencing;Verbal cues for technique;Tactile cues for weight shifting;Tactile cues for sequencing;Tactile cues for initiation;Tactile cues for placement;Tactile cues for weight beaing;Visual cues/gestures for sequencing     Ambulation/Gait   Ambulation/Gait No   Gait Comments Pt has not amb. since 04/2016 with HHPT, per pt's son.     Balance   Balance Assessed Yes     Static Standing Balance   Static Standing - Balance Support Left upper extremity supported   Static Standing - Level of Assistance 3: Mod assist  +2 assist for safety   Static Standing - Comment/# of Minutes Pt able to stand with mod A and feet apart for approx. 2 minutes, pt was able to slide R foot under BOS with max A from PT. Pt was able to perform lateral weight shifting with cues from PT and PTA (assisting for  safety).      RLE Tone   RLE Tone Modified Ashworth     RLE Tone   Modified Ashworth Scale for Grading Hypertonia RLE Considerable increase in muschle tone, passive movement difficult     LLE Tone   LLE Tone Within Functional Limits                           PT Education - 10/20/16 1556    Education provided Yes   Education Details PT discussed exam findings and that PT would trial 2x/week for 4 weeks and reassess progress at that point. PT discussed POC and frequency/duration.    Person(s) Educated Patient;Child(ren)   Methods Explanation   Comprehension Verbalized understanding          PT Short Term Goals - 10/20/16 1619      PT SHORT TERM GOAL #1   Title Pt will be perform HEP with assist from son to improve balance, flexibility and strength. TARGET DATE FOR ALL STGS: 11/17/16   Status New     PT SHORT TERM GOAL #2   Title Pt will perform STS txfs with mod A in order to improve functional mobility and decr. caregiver burdern.   Status New     PT SHORT TERM GOAL #3   Title Attempt amb. as tolerated and appropriate.   Status New           PT Long Term Goals - 10/20/16 1621      PT LONG TERM GOAL #1   Title Pt and pt's son will verbalize CVA risk factors and s/s in order to reduce risk of additional CVA. TARGET DATE FOR ALL LTGS: 12/15/16   Status New     PT LONG TERM GOAL #2   Title Pt will perform STS txfs with min A to decr. caregiver burden and improve functional mobility.    Status New     PT LONG TERM GOAL #3   Title Pt will be able to stand for 5  minutes while perform dynamic balance activities with min A in order to perform ADLs safely, with caregiver.    Status New     PT LONG TERM GOAL #4   Title Pt will improve SIS: mobility score to >/=37.2 to improve quality of life.    Status New               Plan - 10/20/16 1558    Clinical Impression Statement Pt is a 71 y/o female presenting to OPPT neuro with R spastic  hemiparesis and acute confusional state with hx of CVA in 04/2015. Pt's PMH significant for the following: HTN, DM, CHF, CVA, hx of GI bleeds, acute confusional state, aphasia, chronic R shoulder pain (and L shoulder pain starting), anemia, HLD, patent foramen ovale, hx of fall and R hip fracture in 2016. The following deficits were noted during exam: increased R-sided tone, decreased strength, impaired balance, postural dysfunction, impaired ROM, pain, aphasia, difficulty with performing txfs, and inability to amb. at this time 2/2 tone and weakness. Pt's SIS-mobility score indicates pt perceives stroke had a significant impact on mobility. PT unable to formally test gait, as pt was unable to tolerate marching in standing. Pt would benefit from skilled PT to improve safety during functional mobility.    Rehab Potential Fair   Clinical Impairments Affecting Rehab Potential CVA was in 2016 and it is unclear how much pt was actually ambulating with HHPT.   PT Frequency 2x / week   PT Duration 8 weeks   PT Treatment/Interventions ADLs/Self Care Home Management;Biofeedback;Neuromuscular re-education;Balance training;Therapeutic exercise;Therapeutic activities;Functional mobility training;Gait training;Patient/family education;Orthotic Fit/Training;Vestibular   PT Next Visit Plan Provide CVA education handout, initiate flexibility, strengthening and balance (seated and trial standing) HEP.    Recommended Other Services OT eval   Consulted and Agree with Plan of Care Patient;Family member/caregiver   Family Member Consulted son-Terry      Patient will benefit from skilled therapeutic intervention in order to improve the following deficits and impairments:  Decreased endurance, Pain, Impaired tone, Decreased balance, Decreased mobility, Difficulty walking, Impaired flexibility, Postural dysfunction, Decreased coordination, Decreased knowledge of use of DME, Decreased strength, Impaired UE functional  use  Visit Diagnosis: Hemiplegia and hemiparesis following cerebral infarction affecting right dominant side ([place]) - Plan: PT plan of care cert/re-cert  Other abnormalities of gait and mobility - Plan: PT plan of care cert/re-cert  Unsteadiness on feet - Plan: PT plan of care cert/re-cert      G-Codes - 28/31/51 1624    Functional Assessment Tool Used (Outpatient Only) SIS mobility: 22.2%   Functional Limitation Self care   Self Care Current Status (V6160) At least 80 percent but less than 100 percent impaired, limited or restricted   Self Care Goal Status (V3710) At least 60 percent but less than 80 percent impaired, limited or restricted       Problem List Patient Active Problem List   Diagnosis Date Noted  . Muscle spasticity 01/20/2016  . Bilateral lower extremity edema 12/23/2015  . Acute confusional state 10/28/2015  . Right spastic hemiparesis ([place]) 10/07/2015  . Right shoulder pain 07/24/2015  . Fracture, intertrochanteric, right femur ([place]) 06/10/2015  . Stercoral ulcer of rectum 05/16/2015  . GI bleeding 05/15/2015  . GI bleed 05/13/2015  . BRBPR (bright red blood per rectum) 05/13/2015  . CHF (congestive heart failure) ([place])   . Diabetes ([place])   . Hypertension   . History of stroke   . Lower GI bleed   . Left-sided  cerebrovascular accident (CVA) ([facility]) 04/28/2015    Yamilex Borgwardt L 10/20/2016, 4:25 PM  [facility] [address] [place], [place], [address] Phone: [phone]   Fax:  [phone]  Name: [name] MRN: [id number] Date of Birth: October 23, 1945  Geoffry Paradise, PT,DPT 10/20/16 4:26 PM Phone: [phone] Fax: [phone]

## 2016-10-20 NOTE — Telephone Encounter (Addendum)
Dr. Krista Cameron  I saw [name] today for a PT evaluation. I feel that she would benefit from an OT eval, as she is unable to bathe, dress, or prepare food and relies on her son for assistance with ADLs. If you agree, please send [place] a referral for OT.  Thank you, Geoffry Paradise, PT,DPT 10/20/16 4:28 PM Phone: [phone] Fax: [phone]  Order placed for OT. Marcial Pacas, M.D. Ph.D.  [facility] [place], [place] [address] Phone: [phone] Fax:      [phone]

## 2016-10-20 NOTE — Addendum Note (Signed)
Addended by: Marcial Pacas on: 10/20/2016 05:12 PM   Modules accepted: Orders

## 2016-10-20 NOTE — Patient Instructions (Signed)
EASY, GENTLE, AND SLOW  Days of week, months of year, counting 1-20  Nursery rhymes  2-3 syllable words

## 2016-10-21 LAB — URINE CULTURE

## 2016-10-21 NOTE — Procedures (Signed)
   HISTORY: 71 years old female with history of stroke, for intermittent confusion, facial drooling,  TECHNIQUE:  16 channel EEG was performed based on standard 10-16 international system. One channel was dedicated to EKG, which has demonstrates normal sinus rhythm of beats per minutes.  Upon awakening, the posterior background activity was dysarrhythmic, within theta range,  reactive to eye opening and closure.  There was no evidence of epileptiform discharge.  There was also intermittent higher amplitude bilateral frontal dominant delta range activities.  Photic stimulation and hyperventilation were not performed.  No sleep was achieved.  CONCLUSION: This is a mild abnormal awake EEG.  There is no electrodiagnostic evidence of epileptiform discharge. There is evidence of generalized background slowing, indicating bihemispheric malfunction, common etiology are metabolic toxic.  Marcial Pacas, M.D. Ph.D.  [facility] [place], [place] [address] Phone: [phone] Fax:      [phone]

## 2016-10-21 NOTE — Therapy (Signed)
[facility] [address] [place], [place], [address] Phone: [phone]   Fax:  [phone]  Speech Language Pathology Evaluation  Patient Details  Name: [name] MRN: [id number] Date of Birth: Jun 16, 1946 Referring Provider: Marcial Pacas, MD  Encounter Date: 10/20/2016      End of Session - 10/20/16 1722    Visit Number 1   Number of Visits 9   SLP Start Time 1104   SLP Stop Time  1146   SLP Time Calculation (min) 42 min   Activity Tolerance Patient tolerated treatment well      Past Medical History:  Diagnosis Date  . Acute blood loss anemia   . CHF (congestive heart failure) (HCC)    EF 19-50%, grade 1 diastolic dysfunction per echo 04/2015  . 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 CVA (cerebral infarction) 04/2015   Started Plavix 04/2015  . Depression   . Diabetes ([place]) 2016   Type II. On insulin  . Enchondroma of bone 2011   left femur.   . Fracture, intertrochanteric, right femur ([place])   . History of lower GI bleeding   . Hyperlipidemia   . Hypertension   . Patent foramen ovale 04/2015   Started on warfarin 04/2015  . Protein calorie malnutrition ([place])   . Stercoral ulcer of rectum 05/16/2015    Past Surgical History:  Procedure Laterality Date  . CARDIAC SURGERY     Cath without stent  . COLONOSCOPY N/A 05/15/2015   Procedure: COLONOSCOPY;  Surgeon: Mauri Pole, MD;  Location: [facility] ENDOSCOPY;  Service: Endoscopy;  Laterality: N/A;  . FLEXIBLE SIGMOIDOSCOPY N/A 05/15/2015   Procedure: FLEXIBLE SIGMOIDOSCOPY;  Surgeon: Mauri Pole, MD;  Location: [facility] ENDOSCOPY;  Service: Endoscopy;  Laterality: N/A;  at bedside  . INTRAMEDULLARY (IM) NAIL INTERTROCHANTERIC Right 06/12/2015   Procedure: INTRAMEDULLARY (IM) NAIL RIGHT HIP;  Surgeon: Renette Butters, MD;  Location: [facility];  Service: Orthopedics;  Laterality: Right;  . TEE  WITHOUT CARDIOVERSION N/A 05/05/2015   Procedure: TRANSESOPHAGEAL ECHOCARDIOGRAM (TEE);  Surgeon: Dixie Dials, MD;  Location: [facility] ENDOSCOPY;  Service: Cardiovascular;  Laterality: N/A;    There were no vitals filed for this visit.      Subjective Assessment - 10/20/16 1704    Subjective Pt answered yes/no verbally to yes/no questions, and with dysfluency back to ST room and to begin eval. Pt's son [name] reported pt spoke 7-word sentence in lobby prior to eval.   Patient is accompained by: Family member  [name], son   Currently in Pain? Yes   Pain Score 5    Pain Location Shoulder   Pain Orientation Right;Left   Pain Descriptors / Indicators Pins and needles   Pain Type Chronic pain   Pain Onset More than a month ago   Pain Frequency Constant   Aggravating Factors  movement   Pain Relieving Factors meds            SLP Evaluation [facility] - 10/20/16 1704      SLP Visit Information   SLP Received On 10/20/16   Referring Provider Marcial Pacas, MD   Onset Date October 2016   Medical Diagnosis CVA     General Information   HPI Pt is 71 y.o. female who son [name] reports suffered CVA in October 2016. She received HHST for a period of time - [name] states pt reached her insurance limit for visits but that pt's  speech improved over that time. Pt presents today for outpatient ST evaluation.   Behavioral/Cognition Positive for pseudobulbar effect      Prior Functional Status   Cognitive/Linguistic Baseline Within functional limits   Type of Home House    Lives With Family   Available Support Family     Cognition   Overall Cognitive Status Within Functional Limits for tasks assessed  prior to October 2016     Auditory Comprehension   Overall Auditory Comprehension Other (comment)  Appeared functional during eval tasks today   Yes/No Questions Within Functional Limits   Commands Within Functional Limits   Conversation Moderately complex   Other Conversation Comments Pt's auditory  comprehension appeared WFL during eval tasks, yes/no were consistently correct, 1, 2 and more complex 2-step non-oral verbal commands were followed     Reading Comprehension   Reading Status Not tested     Verbal Expression   Overall Verbal Expression Other (comment)  difficult to fully assess given pt's apraxia   Initiation No impairment   Automatic Speech Day of week;Month of year;Counting  mod halting speech persisted   Level of Generative/Spontaneous Verbalization Phrase   Repetition --     Oral Motor/Sensory Function   Overall Oral Motor/Sensory Function Impaired   Labial Symmetry Abnormal symmetry right   Labial Coordination Reduced   Lingual Coordination Reduced   Overall Oral Motor/Sensory Function Difficult to fully assess given pt's oral nonverbal apraxia     Motor Speech   Overall Motor Speech Impaired   Motor Planning Impaired   Level of Impairment Word   Motor Speech Errors Inconsistent;Aware   Interfering Components --  time post-onset   Effective Techniques Slow rate                         SLP Education - 10/20/16 1720    Education provided Yes   Education Details Trial ST for 4 weeks and reassess- and rationale for this, copmensatory strategies (slow, gentle, easy speech), nature of deficits (apraxia of speech), possible goals   Person(s) Educated Patient;Child(ren)   Methods Explanation;Handout            SLP Long Term Goals - 10/21/16 1317      SLP LONG TERM GOAL #1   Title pt will recite rote speech tasks using speech compensations for fluent speech 70% success   Time 4   Period Weeks  or 8 tx visits, for all LTGs   Status New     SLP LONG TERM GOAL #2   Title pt will answer wh ?s with multimodal communication PRN, functionally, with rare min A   Time 4   Period Weeks   Status New     SLP LONG TERM GOAL #3   Title pt will communicate wants and needs functionally using speech compensations    Time 4   Period Weeks    Status New     SLP LONG TERM GOAL #4   Title pt will respond to family's appropriate cues for fluent speech by improved fluency 50% of the time   Time 4   Period Weeks   Status New     SLP LONG TERM GOAL #5   Title pt will perform apraxia/speech HEP with 60% fluent speech with rare min A   Time 4   Period Weeks   Status New          Plan - 10/20/16 1723    Clinical Impression Statement  Pt presents today with mod-severe neurogenic stuttering/apraxia of speech caused by a CVA 18 months ago (October 2016). Pt rec'd HHST for a period of time but son states this was d/c'd due to pt exhausting insurnace visits. Son did not indicate that any outpatient ST was initiated after that time. SLP attempted diagnositc therapy today and pt did not easily respond to auditory and verbal cues/repetition of rote speech with compensatory strategies. Prognosis is guarded at this time (see potential considerations, below), however 4 weeks of ST is recommended and reassessment will occur at that time to discover if additional therapy is warranted. Son inquired to ST about initiating home health ST ([place]) again but SLP explained that [place] would be warranted only if pt were considered homebound status.   Speech Therapy Frequency 2x / week   Duration 4 weeks   Treatment/Interventions Functional tasks;Patient/family education;Compensatory strategies;Oral motor exercises;Internal/external aids;SLP instruction and feedback;Multimodal communcation approach  any or all may be used   Potential to Achieve Goals Fair   Potential Considerations Severity of impairments;Other (comment)  time post-onset   Consulted and Agree with Plan of Care Patient;Family member/caregiver   Family Member Consulted son      Patient will benefit from skilled therapeutic intervention in order to improve the following deficits and impairments:   Verbal apraxia  Aphasia      G-Codes - 10-29-16 1645    Functional Assessment Tool Used  noms   Functional Limitations Motor speech   Motor Speech Current Status [phone]) At least 60 percent but less than 80 percent impaired, limited or restricted   Motor Speech Goal Status (N1700) At least 60 percent but less than 80 percent impaired, limited or restricted      Problem List Patient Active Problem List   Diagnosis Date Noted  . Muscle spasticity 01/20/2016  . Bilateral lower extremity edema 12/23/2015  . Acute confusional state 10/28/2015  . Right spastic hemiparesis ([place]) 10/07/2015  . Right shoulder pain 07/24/2015  . Fracture, intertrochanteric, right femur ([place]) 06/10/2015  . Stercoral ulcer of rectum 05/16/2015  . GI bleeding 05/15/2015  . GI bleed 05/13/2015  . BRBPR (bright red blood per rectum) 05/13/2015  . CHF (congestive heart failure) ([place])   . Diabetes ([place])   . Hypertension   . History of stroke   . Lower GI bleed   . Left-sided cerebrovascular accident (CVA) ([place]) 04/28/2015    [facility] ,MS, [place]  10/21/2016, 1:25 PM  [facility] [address] [place], [place], [address] Phone: [phone]   Fax:  [phone]  Name: [name] MRN: [id number] Date of Birth: 17-Jul-1945

## 2016-10-23 LAB — URINE CULTURE

## 2016-10-24 MED ORDER — SULFAMETHOXAZOLE-TRIMETHOPRIM 800-160 MG PO TABS: 1.0000 | ORAL_TABLET | Freq: Two times a day (BID) | ORAL | 0 refills | Status: DC

## 2016-10-24 NOTE — Progress Notes (Signed)
Limit treatment to 3days only due to possible DDI between bactrim and warfarin.  Choices of oral abx have been limited, due to multiple drug resistance per urine culture.

## 2016-10-25 ENCOUNTER — Telehealth: Payer: Self-pay | Admitting: Nurse Practitioner

## 2016-10-25 NOTE — Telephone Encounter (Signed)
Please call son back in regard to labs at [phone].

## 2016-10-26 ENCOUNTER — Ambulatory Visit: Payer: Medicare Other | Admitting: Speech Pathology

## 2016-10-26 ENCOUNTER — Ambulatory Visit: Payer: Medicare Other | Admitting: Physical Therapy

## 2016-10-26 ENCOUNTER — Encounter: Payer: Self-pay | Admitting: Physical Therapy

## 2016-10-26 DIAGNOSIS — I69351 Hemiplegia and hemiparesis following cerebral infarction affecting right dominant side: Secondary | ICD-10-CM

## 2016-10-26 DIAGNOSIS — R2681 Unsteadiness on feet: Secondary | ICD-10-CM

## 2016-10-26 DIAGNOSIS — R4701 Aphasia: Secondary | ICD-10-CM

## 2016-10-26 DIAGNOSIS — R482 Apraxia: Secondary | ICD-10-CM | POA: Diagnosis not present

## 2016-10-26 DIAGNOSIS — R2689 Other abnormalities of gait and mobility: Secondary | ICD-10-CM | POA: Diagnosis not present

## 2016-10-26 NOTE — Therapy (Signed)
[facility] [address] [place], [place], [address] Phone: [phone]   Fax:  [phone]  Physical Therapy Treatment  Patient Details  Name: [name] MRN: [id number] Date of Birth: 02/03/1946 Referring Provider: Dr. Krista Blue  Encounter Date: 10/26/2016      PT End of Session - 10/26/16 1115    Visit Number 2   Number of Visits 17   Date for PT Re-Evaluation 12/19/16   Authorization Type G-CODE AND PROGRESS NOTE EVERY 10TH VISIT. Medicare and BCBS 2nd   PT Start Time 1108  pt late for apt today   PT Stop Time 1148   PT Time Calculation (min) 40 min   Equipment Utilized During Treatment Gait belt   Activity Tolerance Patient limited by pain  R shoulder pain      Past Medical History:  Diagnosis Date  . Acute blood loss anemia   . CHF (congestive heart failure) (HCC)    EF 50-35%, grade 1 diastolic dysfunction per echo 04/2015  . 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 CVA (cerebral infarction) 04/2015   Started Plavix 04/2015  . Depression   . Diabetes ([place]) 2016   Type II. On insulin  . Enchondroma of bone 2011   left femur.   . Fracture, intertrochanteric, right femur ([place])   . History of lower GI bleeding   . Hyperlipidemia   . Hypertension   . Patent foramen ovale 04/2015   Started on warfarin 04/2015  . Protein calorie malnutrition ([place])   . Stercoral ulcer of rectum 05/16/2015    Past Surgical History:  Procedure Laterality Date  . CARDIAC SURGERY     Cath without stent  . COLONOSCOPY N/A 05/15/2015   Procedure: COLONOSCOPY;  Surgeon: Mauri Pole, MD;  Location: [facility] ENDOSCOPY;  Service: Endoscopy;  Laterality: N/A;  . FLEXIBLE SIGMOIDOSCOPY N/A 05/15/2015   Procedure: FLEXIBLE SIGMOIDOSCOPY;  Surgeon: Mauri Pole, MD;  Location: [facility] ENDOSCOPY;  Service: Endoscopy;  Laterality: N/A;  at bedside  . INTRAMEDULLARY (IM)  NAIL INTERTROCHANTERIC Right 06/12/2015   Procedure: INTRAMEDULLARY (IM) NAIL RIGHT HIP;  Surgeon: Renette Butters, MD;  Location: [facility];  Service: Orthopedics;  Laterality: Right;  . TEE WITHOUT CARDIOVERSION N/A 05/05/2015   Procedure: TRANSESOPHAGEAL ECHOCARDIOGRAM (TEE);  Surgeon: Dixie Dials, MD;  Location[facility] ENDOSCOPY;  Service: Cardiovascular;  Laterality: N/A;    There were no vitals filed for this visit.      Subjective Assessment - 10/26/16 1112    Subjective Pt reports no new issus, no falls. continues with right shoulder pain. Pt is able to answer short yes/no questions this session.    Patient Stated Goals Walk and txf IND or with minor assistance   Currently in Pain? Yes   Pain Score --  pt unable to rate due to aphasia   Pain Location Shoulder   Pain Orientation Right   Pain Descriptors / Indicators Aching;Pins and needles;Dull   Pain Type Chronic pain   Pain Onset More than a month ago   Pain Frequency Constant   Aggravating Factors  movement   Pain Relieving Factors meds, rest            [facility] Adult PT Treatment/Exercise - 10/26/16 1115      Bed Mobility   Supine to Sit 3: Mod assist;HOB flat   Supine to Sit Details (indicate cue type and reason) cues needed to clear surface of mat table  with LE's and for controlled descent of upper body/trunk    Sit to Supine 3: Mod assist;HOB flat   Sit to Supine - Details (indicate cue type and reason) assistance needed to bring legs of edge of mat table and to elevate trunk into sitting position at edge of mat.      Transfers   Transfers Sit to Stand;Stand to Sit   Sit to Stand 4: Min assist;3: Mod assist   Sit to Stand Details Verbal cues for technique;Verbal cues for sequencing;Verbal cues for precautions/safety;Manual facilitation for weight shifting;Manual facilitation for placement;Manual facilitation for weight bearing   Sit to Stand Details (indicate cue type and reason) cues/facilitation needed to stand with  min assist x2 from wheelchair and mod assist of 1 from mat table   Stand to Sit 4: Min assist;3: Mod assist   Stand to Sit Details (indicate cue type and reason) Verbal cues for sequencing;Verbal cues for technique;Verbal cues for safe use of DME/AE;Manual facilitation for placement;Manual facilitation for weight bearing;Manual facilitation for weight shifting   Stand to Sit Details cues/facilitation for increased hip/knee flexion for sitting to both mat and wheelchair. cues also for hand placement with transfers                      Stand Pivot Transfers 3: Mod assist   Stand Pivot Transfer Details (indicate cue type and reason) wheelchair to mat table with 2 person min assist, cues on posture, sequencing, step placement and technique. mod assist of 1 person for transfer from mat table to wheelchair with cues on posture, step placement, and sequening/technique.       issued the following to pt's HEP: Bridge    On back: lift hips up and then slowly down. Son will need to help you get your right leg bent and in position.  Repeat _10_ times. Do _1-2___ sessions per day.  http://pm.exer.us/55   Copyright  VHI. All rights reserved.    Lower Trunk Rotation Stretch    Keeping back flat and feet together, rotate knees to left side. Hold __15__ seconds. Then rotate knees to right side, hold for 15 seconds. Only go within pain free ranges. She will need help with getting into position and with moving legs for the stretch. Repeat __3__ times toward each side. Do _1__ sets per session. Do _1-21_ sessions per day. http://orth.exer.us/122   Copyright  VHI. All rights reserved.          PT Short Term Goals - 10/20/16 1619      PT SHORT TERM GOAL #1   Title Pt will be perform HEP with assist from son to improve balance, flexibility and strength. TARGET DATE FOR ALL STGS: 11/17/16   Status New     PT SHORT TERM GOAL #2   Title Pt will perform STS txfs with mod A in order to improve  functional mobility and decr. caregiver burdern.   Status New     PT SHORT TERM GOAL #3   Title Attempt amb. as tolerated and appropriate.   Status New           PT Long Term Goals - 10/20/16 1621      PT LONG TERM GOAL #1   Title Pt and pt's son will verbalize CVA risk factors and s/s in order to reduce risk of additional CVA. TARGET DATE FOR ALL LTGS: 12/15/16   Status New     PT LONG TERM GOAL #2   Title Pt will  perform STS txfs with min A to decr. caregiver burden and improve functional mobility.    Status New     PT LONG TERM GOAL #3   Title Pt will be able to stand for 5 minutes while perform dynamic balance activities with min A in order to perform ADLs safely, with caregiver.    Status New     PT LONG TERM GOAL #4   Title Pt will improve SIS: mobility score to >/=37.2 to improve quality of life.    Status New            Plan - 10/26/16 1116    Clinical Impression Statement Today's skilled session focused on transfer training, standing posture and LE stretching/strengthening. Issued 2 new exercises to pt's HEP. Pt does need increased time with cues/facilitation for all movements. Pt presents with significant tightness in her right LE and will need continued stretching to address this. Pt should benefit from continued PT to progress toward unmet goals.                                                         Rehab Potential Fair   Clinical Impairments Affecting Rehab Potential CVA was in 2016 and it is unclear how much pt was actually ambulating with HHPT.   PT Frequency 2x / week   PT Duration 8 weeks   PT Treatment/Interventions ADLs/Self Care Home Management;Biofeedback;Neuromuscular re-education;Balance training;Therapeutic exercise;Therapeutic activities;Functional mobility training;Gait training;Patient/family education;Orthotic Fit/Training;Vestibular   PT Next Visit Plan Provide CVA education handout, continue to work on strengthening, flexibility and standing  balance/posture   Consulted and Agree with Plan of Care Patient;Family member/caregiver   Family Member Consulted son-Terry      Patient will benefit from skilled therapeutic intervention in order to improve the following deficits and impairments:  Decreased endurance, Pain, Impaired tone, Decreased balance, Decreased mobility, Difficulty walking, Impaired flexibility, Postural dysfunction, Decreased coordination, Decreased knowledge of use of DME, Decreased strength, Impaired UE functional use  Visit Diagnosis: Hemiplegia and hemiparesis following cerebral infarction affecting right dominant side (HCC)  Other abnormalities of gait and mobility  Unsteadiness on feet     Problem List Patient Active Problem List   Diagnosis Date Noted  . Muscle spasticity 01/20/2016  . Bilateral lower extremity edema 12/23/2015  . Acute confusional state 10/28/2015  . Right spastic hemiparesis ([place]) 10/07/2015  . Right shoulder pain 07/24/2015  . Fracture, intertrochanteric, right femur ([place]) 06/10/2015  . Stercoral ulcer of rectum 05/16/2015  . GI bleeding 05/15/2015  . GI bleed 05/13/2015  . BRBPR (bright red blood per rectum) 05/13/2015  . CHF (congestive heart failure) ([place])   . Diabetes ([place])   . Hypertension   . History of stroke   . Lower GI bleed   . Left-sided cerebrovascular accident (CVA) ([place]) 04/28/2015    Willow Ora, PTA, [facility] [address] [place], [place] [address] [phone] 10/26/16, 9:45 PM  Name: KRYSTALYNN RIDGEWAY MRN: [id number] Date of Birth: July 21, 1945

## 2016-10-26 NOTE — Therapy (Signed)
[facility] [address] [place], [place], [address] Phone: [phone]   Fax:  [phone]  Speech Language Pathology Treatment  Patient Details  Name: [name] MRN: [id number] Date of Birth: [birth year] Referring Provider: Marcial Pacas, MD  Encounter Date: 10/26/2016      End of Session - 10/26/16 1250    Visit Number 2   Number of Visits 9   SLP Start Time 2671   SLP Stop Time  1230   SLP Time Calculation (min) 42 min   Activity Tolerance Patient limited by fatigue      Past Medical History:  Diagnosis Date  . Acute blood loss anemia   . CHF (congestive heart failure) (HCC)    EF 24-58%, grade 1 diastolic dysfunction per echo 04/2015  . 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 CVA (cerebral infarction) 04/2015   Started Plavix 04/2015  . Depression   . Diabetes ([place]) 2016   Type II. On insulin  . Enchondroma of bone 2011   left femur.   . Fracture, intertrochanteric, right femur ([place])   . History of lower GI bleeding   . Hyperlipidemia   . Hypertension   . Patent foramen ovale 04/2015   Started on warfarin 04/2015  . Protein calorie malnutrition ([place])   . Stercoral ulcer of rectum 05/16/2015    Past Surgical History:  Procedure Laterality Date  . CARDIAC SURGERY     Cath without stent  . COLONOSCOPY N/A 05/15/2015   Procedure: COLONOSCOPY;  Surgeon: Mauri Pole, MD;  Location: [facility] ENDOSCOPY;  Service: Endoscopy;  Laterality: N/A;  . FLEXIBLE SIGMOIDOSCOPY N/A 05/15/2015   Procedure: FLEXIBLE SIGMOIDOSCOPY;  Surgeon: Mauri Pole, MD;  Location: [facility] ENDOSCOPY;  Service: Endoscopy;  Laterality: N/A;  at bedside  . INTRAMEDULLARY (IM) NAIL INTERTROCHANTERIC Right 06/12/2015   Procedure: INTRAMEDULLARY (IM) NAIL RIGHT HIP;  Surgeon: Renette Butters, MD;  Location: [facility];  Service: Orthopedics;  Laterality: Right;  . TEE WITHOUT  CARDIOVERSION N/A 05/05/2015   Procedure: TRANSESOPHAGEAL ECHOCARDIOGRAM (TEE);  Surgeon: Dixie Dials, MD;  Location: [facility] ENDOSCOPY;  Service: Cardiovascular;  Laterality: N/A;    There were no vitals filed for this visit.      Subjective Assessment - 10/26/16 1153    Subjective "She talks better when she's not tired." pt with limited output, closing eyes   Currently in Pain? Yes   Pain Score 5    Pain Location Shoulder   Pain Orientation Right   Pain Descriptors / Indicators Aching   Pain Type Chronic pain   Pain Onset More than a month ago   Pain Frequency Constant   Aggravating Factors  movement               ADULT SLP TREATMENT - 10/26/16 1148      General Information   Behavior/Cognition Cooperative;Lethargic;Requires cueing   Patient Positioning Upright in chair   Oral care provided N/A     Treatment Provided   Treatment provided Cognitive-Linquistic     Pain Assessment   Pain Assessment 0-10   Pain Score 5    Pain Location right shoulder   Pain Intervention(s) Monitored during session     Cognitive-Linquistic Treatment   Treatment focused on Aphasia;Apraxia   Skilled Treatment SLP provided education re: plan of care, explained aphasic vs. apraxic errors, introduced compensations for apraxia and wordfinding including reduced rate, easy speech, automatic speech, gestures, description. SLP  facilitated use of automatic speech for pt to answer wh questions (numeric, months of the year), 80% accuracy, consistent mod-max A. Home tasks assigned.     Assessment / Recommendations / Plan   Plan Continue with current plan of care     Progression Toward Goals   Progression toward goals Progressing toward goals          SLP Education - 10/26/16 1250    Education provided Yes   Education Details compensatory strategies for apraxia, aphasia   Person(s) Educated Patient;Child(ren)   Methods Explanation;Handout;Demonstration;Verbal cues   Comprehension Verbalized  understanding;Returned demonstration;Verbal cues required;Need further instruction            SLP Long Term Goals - 10/26/16 1259      SLP LONG TERM GOAL #1   Title pt will recite rote speech tasks using speech compensations for fluent speech 70% success   Time 4   Period Weeks   Status On-going     SLP LONG TERM GOAL #2   Title pt will answer wh ?s with multimodal communication PRN, functionally, with rare min A   Time 4   Period Weeks   Status On-going     SLP LONG TERM GOAL #3   Title pt will communicate wants and needs functionally using speech compensations    Time 4   Period Weeks   Status On-going     SLP LONG TERM GOAL #4   Title pt will respond to family's appropriate cues for fluent speech by improved fluency 50% of the time   Time 4   Period Weeks   Status On-going     SLP LONG TERM GOAL #5   Title pt will perform apraxia/speech HEP with 60% fluent speech with rare min A   Time 4   Period Weeks   Status On-going          Plan - 10/26/16 1255    Clinical Impression Statement Pt presents today with mod-severe neurogenic stuttering/apraxia of speech caused by a CVA 18 months ago (October 2016). Pt frequently looks to son to relay her message during apraxic and aphasic communication breakdowns. With consistent mod-max cues, pt implements compensations for improved verbal output. Prognosis is guarded at this time (see potential considerations, below), however 4 weeks of ST is recommended and reassessment will occur at that time to discover if additional therapy is warranted. Son inquired if pt can attend ST first as opposed to PT as she is fatigued after PT.   Speech Therapy Frequency 2x / week   Duration 4 weeks   Treatment/Interventions Functional tasks;Patient/family education;Compensatory strategies;Oral motor exercises;Internal/external aids;SLP instruction and feedback;Multimodal communcation approach   Potential to Achieve Goals Fair   Potential  Considerations Severity of impairments;Other (comment);Cooperation/participation level   SLP Home Exercise Plan word association tasks assigned   Consulted and Agree with Plan of Care Patient;Family member/caregiver   Family Member Consulted son      Patient will benefit from skilled therapeutic intervention in order to improve the following deficits and impairments:   Verbal apraxia  Aphasia    Problem List Patient Active Problem List   Diagnosis Date Noted  . Muscle spasticity 01/20/2016  . Bilateral lower extremity edema 12/23/2015  . Acute confusional state 10/28/2015  . Right spastic hemiparesis ([place]) 10/07/2015  . Right shoulder pain 07/24/2015  . Fracture, intertrochanteric, right femur ([place]) 06/10/2015  . Stercoral ulcer of rectum 05/16/2015  . GI bleeding 05/15/2015  . GI bleed 05/13/2015  . BRBPR (bright  red blood per rectum) 05/13/2015  . CHF (congestive heart failure) ([place])   . Diabetes ([place])   . Hypertension   . History of stroke   . Lower GI bleed   . Left-sided cerebrovascular accident (CVA) ([place]) 04/28/2015    Deneise Lever, MS CF-SLP Speech-Language Pathologist  Aliene Altes 10/26/2016, 1:00 PM  [facility] [address] [place], [place], [address] Phone: [phone]   Fax:  [phone]   Name: [name] MRN: [id number] Date of Birth: 26-Mar-1946

## 2016-10-26 NOTE — Telephone Encounter (Signed)
Spoke with pts son, he is aware

## 2016-10-26 NOTE — Patient Instructions (Addendum)
Bridge    On back: lift hips up and then slowly down. Son will need to help you get your right leg bent and in position.  Repeat _10_ times. Do _1-2___ sessions per day.  http://pm.exer.us/55   Copyright  VHI. All rights reserved.    Lower Trunk Rotation Stretch    Keeping back flat and feet together, rotate knees to left side. Hold __15__ seconds. Then rotate knees to right side, hold for 15 seconds. Only go within pain free ranges. She will need help with getting into position and with moving legs for the stretch. Repeat __3__ times toward each side. Do _1__ sets per session. Do _1-21_ sessions per day. http://orth.exer.us/122   Copyright  VHI. All rights reserved.

## 2016-10-27 ENCOUNTER — Ambulatory Visit (INDEPENDENT_AMBULATORY_CARE_PROVIDER_SITE_OTHER): Payer: Medicare Other | Admitting: Neurology

## 2016-10-27 ENCOUNTER — Encounter: Payer: Self-pay | Admitting: Neurology

## 2016-10-27 VITALS — BP 118/75 | HR 67

## 2016-10-27 DIAGNOSIS — G8111 Spastic hemiplegia affecting right dominant side: Secondary | ICD-10-CM

## 2016-10-27 MED ORDER — INCOBOTULINUMTOXINA 100 UNITS IM SOLR
300.0000 [IU] | INTRAMUSCULAR | Status: DC
  Administered 2016-10-27: 300 [IU] via INTRAMUSCULAR

## 2016-10-27 NOTE — Progress Notes (Signed)
Chief Complaint  Patient presents with  . Spastic Hemiplegia    Xeomin 300 units - office supply      PATIENT: [name] DOB: 15-Dec-1945   HISTORICAL  CHAVA DULAC distant years old right-handed female, follow-up her most recent stroke in April 28 2015  She had a history of hypertension, diabetes, hyperlipidemia, not on any anticoagulation treatment prior to hospital admission.  In April 27 2016, she presenting with acute onset slurred speech, right leg and arm weakness, I personally reviewed MRI of the brain showed evidence of left ACA infarction, with left A2 occlusion,  She was found to have patent foramina ovale by TEE in October 2016, was put on Coumadin as stroke prevention  She also suffered right hip fracture require surgery in June 12 2015, she is now at [facility], will be back home with her son in next few days  She also complains of few days' history of severe right shoulder pain, right shoulder tightness, limited range of motion. I reviewed laboratory, LDL 121, A1c 11.6,  Repeat Lab in 12 2016, Hg 9.5, A1c 7.0.  UPDATE October 28 2015: Patient was taken to the emergency room in October 24 2015, she was noted to have increased confusion, facial drooping, increased right shoulder pain, her son [name] who brought her to clinic today, stated when she has severe right shoulder pain, she often become mild confused, usually improved after she taking baclofen, gabapentin tramadol, but the medications over and put her into sleep, sometimes she has intermittent muscle spasm at right arm, right leg,  I personally reviewed CAT scan of the brain without contrast October 24 2015, evidence of left ACA territory stroke at the left frontal lobe, there is no acute lesion  I reviewed laboratory evaluation, normal CMP with exception of elevated glucose 290, normal CBC, INR was 2.5, troponin was negative  Before stroke, she was highly functional," 100%", now she is no longer  ambulatory, complains constant right shoulder pain, has no meaningful movement of right arm or leg, significant aphasia  X-ray of her right shoulder in January 2017 showed no significant abnormality  UPDATE October 07 2016: She lost follow-up since last visit in April 2017, she is brought in by her son [name] at today's clinical visit, she continued to complains of right shoulder pain, limited range of motion of right shoulder, is no longer ambulatory, she has memory loss, but overall stable, able to carry on normal conversation, able to feed herself,  She was given a dose of tramadol 50 mg, gabapentin 300 mg, and baclofen 10 mg prior to her office visit to relax her right shoulder, during the conversation, she was noted to have transient confusion, could not carry on the conversation, then followed by a more lucid period of time.  I am concerned that the observe episode could due to partial seizure. Her son did not notice any seizure-like episode at home.  Previous EEG in May 2017 showed mild background slowing, there was no evidence of epileptiform discharge.  I reviewed laboratory evaluation in 2017 A1c 8.4, frequent UTIs, INR 1.33-2.5, she supposed to take Coumadin, CMP showed elevated glucose 194, creatinine 0.91,  UPDATE October 27 2016: This is her first EMG guided botulism toxin injection for spastic right hemiparesis  REVIEW OF SYSTEMS: Full 14 system review of systems performed and notable only for right shoulder pain  ALLERGIES: Allergies  Allergen Reactions  . Pork-Derived Products Other (See Comments)    To keep blood pressure  down    HOME MEDICATIONS: Current Outpatient Prescriptions  Medication Sig Dispense Refill  . atorvastatin (LIPITOR) 40 MG tablet Take 1 tablet (40 mg total) by mouth daily. 30 tablet 3  . baclofen (LIORESAL) 10 MG tablet Take 1 tablet (10 mg total) by mouth 3 (three) times daily. 90 tablet 11  . gabapentin (NEURONTIN) 300 MG capsule Take 1 capsule (300 mg  total) by mouth 3 (three) times daily. 90 capsule 11  . insulin glargine (LANTUS) 100 UNIT/ML injection Inject 12 Units into the skin at bedtime.     . metFORMIN (GLUCOPHAGE) 500 MG tablet Take 500 mg by mouth 2 (two) times daily with a meal.    . metoprolol succinate (TOPROL-XL) 25 MG 24 hr tablet Take 25 mg by mouth daily. Hold for SBP <110 or HR <60    . nitroGLYCERIN (NITROSTAT) 0.4 MG SL tablet Place 0.4 mg under the tongue every 5 (five) minutes as needed for chest pain (up to 3 doses.  If no relief call MD/NP).     Marland Kitchen sulfamethoxazole-trimethoprim (BACTRIM DS,SEPTRA DS) 800-160 MG tablet Take 1 tablet by mouth 2 (two) times daily. 6 tablet 0  . traMADol (ULTRAM) 50 MG tablet Take 1-2 tablets (50-100 mg total) by mouth every 8 (eight) hours as needed for moderate pain or severe pain. 180 tablet 2  . warfarin (COUMADIN) 5 MG tablet Take 5 mg by mouth daily.      No current facility-administered medications for this visit.     PAST MEDICAL HISTORY: Past Medical History:  Diagnosis Date  . Acute blood loss anemia   . CHF (congestive heart failure) (HCC)    EF 16-07%, grade 1 diastolic dysfunction per echo 04/2015  . 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 CVA (cerebral infarction) 04/2015   Started Plavix 04/2015  . Depression   . Diabetes ([place]) 2016   Type II. On insulin  . Enchondroma of bone 2011   left femur.   . Fracture, intertrochanteric, right femur ([place])   . History of lower GI bleeding   . Hyperlipidemia   . Hypertension   . Patent foramen ovale 04/2015   Started on warfarin 04/2015  . Protein calorie malnutrition ([place])   . Stercoral ulcer of rectum 05/16/2015    PAST SURGICAL HISTORY: Past Surgical History:  Procedure Laterality Date  . CARDIAC SURGERY     Cath without stent  . COLONOSCOPY N/A 05/15/2015   Procedure: COLONOSCOPY;  Surgeon: Mauri Pole, MD;  Location: [facility] ENDOSCOPY;  Service:  Endoscopy;  Laterality: N/A;  . FLEXIBLE SIGMOIDOSCOPY N/A 05/15/2015   Procedure: FLEXIBLE SIGMOIDOSCOPY;  Surgeon: Mauri Pole, MD;  Location: [facility] ENDOSCOPY;  Service: Endoscopy;  Laterality: N/A;  at bedside  . INTRAMEDULLARY (IM) NAIL INTERTROCHANTERIC Right 06/12/2015   Procedure: INTRAMEDULLARY (IM) NAIL RIGHT HIP;  Surgeon: Renette Butters, MD;  Location: [facility];  Service: Orthopedics;  Laterality: Right;  . TEE WITHOUT CARDIOVERSION N/A 05/05/2015   Procedure: TRANSESOPHAGEAL ECHOCARDIOGRAM (TEE);  Surgeon: Dixie Dials, MD;  Location: [facility] ENDOSCOPY;  Service: Cardiovascular;  Laterality: N/A;    FAMILY HISTORY: Family History  Problem Relation Age of Onset  . Hypertension Mother   . Heart failure Mother   . Hyperlipidemia Mother   . Heart attack Father     SOCIAL HISTORY:  Social History   Social History  . Marital status: Married    Spouse name: N/A  . Number of children: 6  .  Years of education: 92   Occupational History  . Retired    Social History Main Topics  . Smoking status: Former Smoker    Types: Cigarettes    Quit date: 04/05/2015  . Smokeless tobacco: Never Used     Comment: Quit 04/28/15  . Alcohol use No  . Drug use: No  . Sexual activity: Not on file   Other Topics Concern  . Not on file   Social History Narrative   She will be living with her two sons.\   Right-handed.   No caffeine use.     PHYSICAL EXAM   Vitals:    Not recorded      There is no height or weight on file to calculate BMI.  PHYSICAL EXAMNIATION:  Gen: NAD, conversant, well nourised, obese, well groomed                     Cardiovascular: Regular rate rhythm, no peripheral edema, warm, nontender. Eyes: Conjunctivae clear without exudates or hemorrhage Neck: Supple, no carotid bruise. Pulmonary: Clear to auscultation bilaterally   NEUROLOGICAL EXAM:  MENTAL STATUS: Speech/cognition: She has expressive aphasia, following commands, able to onset question  by yes or no, not intelligible speech,   CRANIAL NERVES: CN II: Right visual field deficit on double spontaneous stimulation, Pupils are round equal and briskly reactive to light. CN III, IV, VI: extraocular movement are normal. No ptosis. CN V: Facial sensation is intact to pinprick in all 3 divisions bilaterally. Corneal responses are intact.  CN VII: Face is symmetric with normal eye closure and smile. CN VIII: Hearing is normal to rubbing fingers CN IX, X: Palate elevates symmetrically. Phonation is normal. CN XI: Head turning and shoulder shrug are intact CN XII: Tongue is midline with normal movements and no atrophy.  MOTOR: She only has trace movement of right lower extremity, complains of significant right shoulder pain, limited range of motion of right shoulder, right proximal upper extremity motor strength was 3 and distal strength was 4  REFLEXES: Hyperreflexia on the right arm, right leg, plantar responses are flexor bilaterally  SENSORY: Intact to light touch, pinprick and vibratory sensation  COORDINATION: Rapid alternating movements and fine finger movements are intact. There is no dysmetria on finger-to-nose and heel-knee-shin.    GAIT/STANCE: She has difficulty bearing weight even with assistant   DIAGNOSTIC DATA (LABS, IMAGING, TESTING) - I reviewed patient records, labs, notes, testing and imaging myself where available.   ASSESSMENT AND PLAN  HEYDY MONTILLA is a 71 y.o. female   Acute onset of confusion  She has evidence of left frontal encephalomalacia, at the high risk for developing complex partial seizure  EEG in April 2018 showed generalized slowing no epileptiform discharge  Keep Depakote ER 500mg  qhs.   Vitamin D deficiency  Continue vitamin D supplement   Left ACA stroke, left A2 occlusion with residual spastic right hemiparesis, right shoulder pain  She has evidence of patent foraminal ovale is taking Coumadin   EMG guided xeomin injection  for spastic right upper extremity, right shoulder pain, initially injection was on October 27 2016  Under EMG guidance, we used 300 units of xeomin,(100 units/2 cc of normal saline)  Right pectoralis major 25 units times 3= 75 units Right teres major 25 units x2= 50 units. Right Latissimus dorsi 25 unitsx2= 50 units  Right brachialis 50 units Right biceps 50 units Right pronator teres 25 units  She will return to clinic in 3 months for  repeat injections.    Marcial Pacas, M.D. Ph.D.  [facility] [address] [place], [place] [address] Ph: [phone] Fax: [phone]  CC: Referring Provider

## 2016-10-27 NOTE — Progress Notes (Signed)
**  Xeomin 100 units, [place] [id number], Lot [id number], Exp 10/2018, office supply.//mck,rn**

## 2016-10-29 ENCOUNTER — Ambulatory Visit: Payer: Medicare Other | Admitting: Physical Therapy

## 2016-10-29 ENCOUNTER — Ambulatory Visit: Payer: Medicare Other

## 2016-10-29 ENCOUNTER — Encounter: Payer: Self-pay | Admitting: Physical Therapy

## 2016-10-29 DIAGNOSIS — R2681 Unsteadiness on feet: Secondary | ICD-10-CM | POA: Diagnosis not present

## 2016-10-29 DIAGNOSIS — R2689 Other abnormalities of gait and mobility: Secondary | ICD-10-CM

## 2016-10-29 DIAGNOSIS — R482 Apraxia: Secondary | ICD-10-CM | POA: Diagnosis not present

## 2016-10-29 DIAGNOSIS — R4701 Aphasia: Secondary | ICD-10-CM

## 2016-10-29 DIAGNOSIS — I69351 Hemiplegia and hemiparesis following cerebral infarction affecting right dominant side: Secondary | ICD-10-CM | POA: Diagnosis not present

## 2016-10-29 NOTE — Patient Instructions (Signed)
   Words to speak with Vaughan Basta:    TAP HER LEG ONCE PER SECOND.   YOU SAY THE WORD, THEN YOU REPEAT THE WORD WITH HER  SAY EACH SYLLABLE WITH EACH TAP - PUT ONE OR TWO TAPS  IN BETWEEN THE WORDS  KEEP TAPPING HER LEG DURING EVERY WORD AND IN BETWEEN WORDS  Bum-ble-bee Bas-ket-ball Big-buil-ding Black-ber-ry Ba-by-doll Bi-cy-cle Be-gin-ner Ba-by-sit Ba-na-na Ba-lo-ney Bu-bu-ly (bubbly) Big-bug-gy Po-si-tive Pa-ja-mas Pass-en-ger Pa-ra-graph Po-ny-ride Po-se-sion (possession) Pine-ap-ple Po-ta-to

## 2016-10-29 NOTE — Therapy (Signed)
[facility] [address] [place], [place], [address] Phone: [phone]   Fax:  [phone]  Speech Language Pathology Treatment  Patient Details  Name: [name] MRN: [id number] Date of Birth: 1945-08-16 Referring Provider: Marcial Pacas, MD  Encounter Date: 10/29/2016      End of Session - 10/29/16 1144    Visit Number 3   Number of Visits 9   SLP Start Time 1017   SLP Stop Time  2458   SLP Time Calculation (min) 45 min   Activity Tolerance Patient tolerated treatment well      Past Medical History:  Diagnosis Date  . Acute blood loss anemia   . CHF (congestive heart failure) (HCC)    EF 09-98%, grade 1 diastolic dysfunction per echo 04/2015  . 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 CVA (cerebral infarction) 04/2015   Started Plavix 04/2015  . Depression   . Diabetes ([place]) 2016   Type II. On insulin  . Enchondroma of bone 2011   left femur.   . Fracture, intertrochanteric, right femur ([place])   . History of lower GI bleeding   . Hyperlipidemia   . Hypertension   . Patent foramen ovale 04/2015   Started on warfarin 04/2015  . Protein calorie malnutrition ([place])   . Stercoral ulcer of rectum 05/16/2015    Past Surgical History:  Procedure Laterality Date  . CARDIAC SURGERY     Cath without stent  . COLONOSCOPY N/A 05/15/2015   Procedure: COLONOSCOPY;  Surgeon: Mauri Pole, MD;  Location: [facility] ENDOSCOPY;  Service: Endoscopy;  Laterality: N/A;  . FLEXIBLE SIGMOIDOSCOPY N/A 05/15/2015   Procedure: FLEXIBLE SIGMOIDOSCOPY;  Surgeon: Mauri Pole, MD;  Location: [facility] ENDOSCOPY;  Service: Endoscopy;  Laterality: N/A;  at bedside  . INTRAMEDULLARY (IM) NAIL INTERTROCHANTERIC Right 06/12/2015   Procedure: INTRAMEDULLARY (IM) NAIL RIGHT HIP;  Surgeon: Renette Butters, MD;  Location: [facility];  Service: Orthopedics;  Laterality: Right;  . TEE  WITHOUT CARDIOVERSION N/A 05/05/2015   Procedure: TRANSESOPHAGEAL ECHOCARDIOGRAM (TEE);  Surgeon: Dixie Dials, MD;  Location: [facility] ENDOSCOPY;  Service: Cardiovascular;  Laterality: N/A;    There were no vitals filed for this visit.      Subjective Assessment - 10/29/16 1027    Subjective Unable to tell physical or occuaptional, when SLP asked what therapy she last had, when encourgaed to use speech. Pt pointed to legs, initially, when asked.   Currently in Pain? No/denies               ADULT SLP TREATMENT - 10/29/16 1029      General Information   Behavior/Cognition Alert;Cooperative;Lethargic  flat affect     Treatment Provided   Treatment provided Cognitive-Linquistic     Cognitive-Linquistic Treatment   Treatment focused on Aphasia;Apraxia   Skilled Treatment SLP used tactile cues (tapping on pt's knee) to facilitate fluent 2-3 syllable words -pt repeated the word in unison (max cues, consistently) wit  SLP 75% success.  In rote speech tasks in unison (mod-max cues consistently), pt had more success after 2-3 reps of the task. SLP provided a list of words for pt to practice at home and SLP took 2-3 minutes after session to explain to pt's son [name] the practice routine with tapping/repetition/unison practice.     Assessment / Recommendations / Plan   Plan Continue with current plan of care     Progression Toward Goals  Progression toward goals Progressing toward goals          SLP Education - 10/29/16 1144    Education provided Yes   Education Details home tasks   Person(s) Educated Patient;Child(ren)   Methods Explanation;Demonstration   Comprehension Verbalized understanding            SLP Long Term Goals - 10/29/16 1145      SLP LONG TERM GOAL #1   Title pt will recite rote speech tasks using speech compensations for fluent speech 70% success   Time 4   Period Weeks   Status On-going     SLP LONG TERM GOAL #2   Title pt will answer wh ?s with  multimodal communication PRN, functionally, with rare min A   Time 4   Period Weeks   Status On-going     SLP LONG TERM GOAL #3   Title pt will communicate wants and needs functionally using speech compensations    Time 4   Period Weeks   Status On-going     SLP LONG TERM GOAL #4   Title pt will respond to family's appropriate cues for fluent speech by improved fluency 50% of the time   Time 4   Period Weeks   Status On-going     SLP LONG TERM GOAL #5   Title pt will perform apraxia/speech HEP with 60% fluent speech with rare min A   Time 4   Period Weeks   Status On-going          Plan - 10/29/16 1145    Clinical Impression Statement Pt presents today with mod-severe neurogenic stuttering/apraxia of speech caused by a CVA 18 months ago (October 2016). Pt frequently looks to son to relay her message during apraxic and aphasic communication breakdowns. With consistent mod-max cues, pt implements compensations for improved verbal output. Prognosis is guarded at this time (see potential considerations, below), however 4 weeks of ST is recommended and reassessment will occur at that time to discover if additional therapy is warranted. Son inquired if pt can attend ST first as opposed to PT as she is fatigued after PT.   Speech Therapy Frequency 2x / week   Duration 4 weeks   Treatment/Interventions Functional tasks;Patient/family education;Compensatory strategies;Oral motor exercises;Internal/external aids;SLP instruction and feedback;Multimodal communcation approach   Potential to Achieve Goals Fair   Potential Considerations Severity of impairments;Other (comment);Cooperation/participation level   SLP Home Exercise Plan word association tasks assigned   Consulted and Agree with Plan of Care Patient;Family member/caregiver   Family Member Consulted son      Patient will benefit from skilled therapeutic intervention in order to improve the following deficits and impairments:    Verbal apraxia  Aphasia    Problem List Patient Active Problem List   Diagnosis Date Noted  . Muscle spasticity 01/20/2016  . Bilateral lower extremity edema 12/23/2015  . Acute confusional state 10/28/2015  . Right spastic hemiparesis ([place]) 10/07/2015  . Right shoulder pain 07/24/2015  . Fracture, intertrochanteric, right femur ([place]) 06/10/2015  . Stercoral ulcer of rectum 05/16/2015  . GI bleeding 05/15/2015  . GI bleed 05/13/2015  . BRBPR (bright red blood per rectum) 05/13/2015  . CHF (congestive heart failure) ([place])   . Diabetes ([place])   . Hypertension   . History of stroke   . Lower GI bleed   . Left-sided cerebrovascular accident (CVA) ([place]) 04/28/2015    [place] ,[place], [place]  10/29/2016, 11:45 AM  [facility] [phone]  [place], [place], [address] Phone: [phone]   Fax:  [phone]   Name: [name] MRN: [id number] Date of Birth: Nov 28, 1945

## 2016-10-29 NOTE — Therapy (Signed)
[facility] [address] [place], [place], [address] Phone: [phone]   Fax:  [phone]  Physical Therapy Treatment  Patient Details  Name: [name] MRN: [id number] Date of Birth: [birth year] Referring Provider: Dr. Krista Blue  Encounter Date: 10/29/2016      PT End of Session - 10/29/16 0942    Visit Number 3   Number of Visits 17   Date for PT Re-Evaluation 12/19/16   Authorization Type G-CODE AND PROGRESS NOTE EVERY 10TH VISIT. Medicare and BCBS 2nd   PT Start Time 0940  pt late for apt today   PT Stop Time 1015   PT Time Calculation (min) 35 min   Equipment Utilized During Treatment Gait belt   Activity Tolerance Patient limited by pain  R shoulder pain      Past Medical History:  Diagnosis Date  . Acute blood loss anemia   . CHF (congestive heart failure) (HCC)    EF 06-26%, grade 1 diastolic dysfunction per echo 04/2015  . 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 CVA (cerebral infarction) 04/2015   Started Plavix 04/2015  . Depression   . Diabetes ([place]) 2016   Type II. On insulin  . Enchondroma of bone 2011   left femur.   . Fracture, intertrochanteric, right femur ([place])   . History of lower GI bleeding   . Hyperlipidemia   . Hypertension   . Patent foramen ovale 04/2015   Started on warfarin 04/2015  . Protein calorie malnutrition ([place])   . Stercoral ulcer of rectum 05/16/2015    Past Surgical History:  Procedure Laterality Date  . CARDIAC SURGERY     Cath without stent  . COLONOSCOPY N/A 05/15/2015   Procedure: COLONOSCOPY;  Surgeon: Mauri Pole, MD;  Location: [facility] ENDOSCOPY;  Service: Endoscopy;  Laterality: N/A;  . FLEXIBLE SIGMOIDOSCOPY N/A 05/15/2015   Procedure: FLEXIBLE SIGMOIDOSCOPY;  Surgeon: Mauri Pole, MD;  Location: [facility] ENDOSCOPY;  Service: Endoscopy;  Laterality: N/A;  at bedside  . INTRAMEDULLARY (IM)  NAIL INTERTROCHANTERIC Right 06/12/2015   Procedure: INTRAMEDULLARY (IM) NAIL RIGHT HIP;  Surgeon: Renette Butters, MD;  Location: [facility];  Service: Orthopedics;  Laterality: Right;  . TEE WITHOUT CARDIOVERSION N/A 05/05/2015   Procedure: TRANSESOPHAGEAL ECHOCARDIOGRAM (TEE);  Surgeon: Dixie Dials, MD;  Location: [facility] ENDOSCOPY;  Service: Cardiovascular;  Laterality: N/A;    There were no vitals filed for this visit.      Subjective Assessment - 10/29/16 0941    Subjective No new complaints. Pt reports no falls or pain. Had injections to right UE this week at neuro md office.    Pertinent History HTN, DM, CHF, CVA, hx of GI bleeds, acute confusional state, aphasia, chronic R shoulder pain (and L shoulder pain starting), anemia, HLD, patent foramen ovale, hx of fall and R hip fracture in 2016   Patient Stated Goals Walk and txf IND or with minor assistance   Currently in Pain? No/denies   Pain Score 0-No pain            [facility] Adult PT Treatment/Exercise - 10/29/16 0943      Bed Mobility   Bed Mobility Sit to Supine;Supine to Sit   Supine to Sit 3: Mod assist;HOB flat   Supine to Sit Details (indicate cue type and reason) multimodal cues needed for rolling onto left side and coming up into seated position. assist needed to bring LE's off edge  of mat and to bring trunk up into sitting position.    Sit to Supine 3: Mod assist;HOB flat   Sit to Supine - Details (indicate cue type and reason) multimodal cues and assistance needed to lie down in controlled manner     Transfers   Transfers Sit to Stand;Stand to Sit;Stand Pivot Transfers   Sit to Stand 3: Mod assist   Sit to Stand Details Verbal cues for technique;Verbal cues for sequencing;Verbal cues for precautions/safety;Manual facilitation for weight shifting;Manual facilitation for placement;Manual facilitation for weight bearing   Sit to Stand Details (indicate cue type and reason) incr time needed with multimodal cues/facitlitation.  incr assistance needed for initial power up. once standing pt needed only min assist for stability    Stand to Sit 4: Min assist   Stand to Sit Details (indicate cue type and reason) Verbal cues for sequencing;Verbal cues for technique;Verbal cues for safe use of DME/AE;Manual facilitation for placement;Manual facilitation for weight bearing;Manual facilitation for weight shifting   Stand to Sit Details needed multimodal cues for controlled sitting with max assist/facilitaiton for hip/knee flexion with sitting, pt also with significant posterior trunk lean with sitting both times   Stand Pivot Transfers 3: Mod assist   Stand Pivot Transfer Details (indicate cue type and reason) incr time needed for transfers with assist/facilitation for weight shifting, right LE advancement. Pt able to advance her left LE with incr time and cues.     Neuro Re-ed    Neuro Re-ed Details  right LE PNF patterns (both) for decr tone and muscle tightness, performed passively.      Knee/Hip Exercises: Stretches   Passive Hamstring Stretch Right;60 seconds;Limitations;3 reps   Passive Hamstring Stretch Limitations performed in supine to pt tolerance   Quad Stretch Right;3 reps;60 seconds;Limitations   Quad Stretch Limitations performed in supine with pt's right foot on pillowcase on slide board: passively pushing pt's foot toward her for knee flexion for stretch with max assist downgrading to mod assist                               Knee/Hip Exercises: Supine   Heel Slides PROM;AAROM;Strengthening;Right;Limitations;10 reps   Heel Slides Limitations in addition to stretching, performed these reps with emphasis on pt trying to assist for strengthening   Other Supine Knee/Hip Exercises hip abduction/adduction x 10 reps: foot on pillowcase on slide board to decr friciton and allow for more motion.              PT Short Term Goals - 10/20/16 1619      PT SHORT TERM GOAL #1   Title Pt will be perform HEP with assist  from son to improve balance, flexibility and strength. TARGET DATE FOR ALL STGS: 11/17/16   Status New     PT SHORT TERM GOAL #2   Title Pt will perform STS txfs with mod A in order to improve functional mobility and decr. caregiver burdern.   Status New     PT SHORT TERM GOAL #3   Title Attempt amb. as tolerated and appropriate.   Status New           PT Long Term Goals - 10/20/16 1621      PT LONG TERM GOAL #1   Title Pt and pt's son will verbalize CVA risk factors and s/s in order to reduce risk of additional CVA. TARGET DATE FOR ALL LTGS: 12/15/16   Status  New     PT LONG TERM GOAL #2   Title Pt will perform STS txfs with min A to decr. caregiver burden and improve functional mobility.    Status New     PT LONG TERM GOAL #3   Title Pt will be able to stand for 5 minutes while perform dynamic balance activities with min A in order to perform ADLs safely, with caregiver.    Status New     PT LONG TERM GOAL #4   Title Pt will improve SIS: mobility score to >/=37.2 to improve quality of life.    Status New             Plan - 10/29/16 1497    Clinical Impression Statement Today's skilled session continued to focus on transfers, standing balance/posture and right LE stretching. Pt continues to present with significant right LE tone/tightness, needing increased assistance with all right LE movements. Pt should benefit from continued PT to progress toward unmet goals.    Rehab Potential Fair   Clinical Impairments Affecting Rehab Potential CVA was in 2016 and it is unclear how much pt was actually ambulating with HHPT.   PT Frequency 2x / week   PT Duration 8 weeks   PT Treatment/Interventions ADLs/Self Care Home Management;Biofeedback;Neuromuscular re-education;Balance training;Therapeutic exercise;Therapeutic activities;Functional mobility training;Gait training;Patient/family education;Orthotic Fit/Training;Vestibular   PT Next Visit Plan Provide CVA education handout,  continue to work on strengthening, flexibility and standing balance/posture   Consulted and Agree with Plan of Care Patient   Family Member Consulted --      Patient will benefit from skilled therapeutic intervention in order to improve the following deficits and impairments:  Decreased endurance, Pain, Impaired tone, Decreased balance, Decreased mobility, Difficulty walking, Impaired flexibility, Postural dysfunction, Decreased coordination, Decreased knowledge of use of DME, Decreased strength, Impaired UE functional use  Visit Diagnosis: Hemiplegia and hemiparesis following cerebral infarction affecting right dominant side (HCC)  Other abnormalities of gait and mobility  Unsteadiness on feet     Problem List Patient Active Problem List   Diagnosis Date Noted  . Muscle spasticity 01/20/2016  . Bilateral lower extremity edema 12/23/2015  . Acute confusional state 10/28/2015  . Right spastic hemiparesis ([place]) 10/07/2015  . Right shoulder pain 07/24/2015  . Fracture, intertrochanteric, right femur ([place]) 06/10/2015  . Stercoral ulcer of rectum 05/16/2015  . GI bleeding 05/15/2015  . GI bleed 05/13/2015  . BRBPR (bright red blood per rectum) 05/13/2015  . CHF (congestive heart failure) ([place])   . Diabetes ([place])   . Hypertension   . History of stroke   . Lower GI bleed   . Left-sided cerebrovascular accident (CVA) ([place]) 04/28/2015    Willow Ora, PTA, [facility] [address] [place], [place] [address] [phone] 10/29/16, 3:23 PM   Name: [name] MRN: [id number] Date of Birth: [birth year]

## 2016-11-02 ENCOUNTER — Encounter: Payer: Self-pay | Admitting: Occupational Therapy

## 2016-11-02 ENCOUNTER — Ambulatory Visit: Payer: Medicare Other

## 2016-11-02 ENCOUNTER — Ambulatory Visit: Payer: Medicare Other | Attending: Neurology

## 2016-11-02 ENCOUNTER — Ambulatory Visit: Payer: Medicare Other | Admitting: Occupational Therapy

## 2016-11-02 DIAGNOSIS — M79641 Pain in right hand: Secondary | ICD-10-CM

## 2016-11-02 DIAGNOSIS — M79601 Pain in right arm: Secondary | ICD-10-CM

## 2016-11-02 DIAGNOSIS — I69851 Hemiplegia and hemiparesis following other cerebrovascular disease affecting right dominant side: Secondary | ICD-10-CM | POA: Diagnosis not present

## 2016-11-02 DIAGNOSIS — R41844 Frontal lobe and executive function deficit: Secondary | ICD-10-CM | POA: Insufficient documentation

## 2016-11-02 DIAGNOSIS — R4701 Aphasia: Secondary | ICD-10-CM

## 2016-11-02 DIAGNOSIS — R482 Apraxia: Secondary | ICD-10-CM | POA: Insufficient documentation

## 2016-11-02 DIAGNOSIS — R2689 Other abnormalities of gait and mobility: Secondary | ICD-10-CM | POA: Diagnosis not present

## 2016-11-02 DIAGNOSIS — I69351 Hemiplegia and hemiparesis following cerebral infarction affecting right dominant side: Secondary | ICD-10-CM | POA: Diagnosis not present

## 2016-11-02 DIAGNOSIS — R2681 Unsteadiness on feet: Secondary | ICD-10-CM | POA: Diagnosis not present

## 2016-11-02 DIAGNOSIS — R293 Abnormal posture: Secondary | ICD-10-CM

## 2016-11-02 NOTE — Patient Instructions (Signed)
  Work with items that have high meaning and high frequency (family names, favorite foods, etc), or "automatic" talking (days of week, months of year, numbers, nursery rhymes) Do this at least 5 times a week for about 20 minutes  Put on some music that [employer] and would have sung to before the stroke and sing, sing, sing!! Tap on her leg, or take her hand and tap on her leg with her to the beat and sing along with the song - do this for 20 minutes at least three times a week   Keep using those words and pictures and have [name] point in order to communicate. Any way to communicate is ok! Gestures, drawing, showing on hands (numbers), pointing when given a choice, etc.

## 2016-11-02 NOTE — Therapy (Signed)
[facility] [address] [place], [place], [address] Phone: [phone]   Fax:  [phone]  Occupational Therapy Evaluation  Patient Details  Name: GENETTA FIERO MRN: [id number] Date of Birth: [birth year] Referring Provider: Dr.Yan  Encounter Date: 11/02/2016      OT End of Session - 11/02/16 1430    Visit Number 1   Number of Visits 16   Date for OT Re-Evaluation 12/28/16   Authorization Type medicare, BCBS as secondary.  Pt will need G code and PN every 10th visit   Authorization Time Period 60 days   Authorization - Visit Number 1   Authorization - Number of Visits 10   OT Start Time 3474   OT Stop Time 1359   OT Time Calculation (min) 41 min   Activity Tolerance Patient tolerated treatment well      Past Medical History:  Diagnosis Date  . Acute blood loss anemia   . CHF (congestive heart failure) (HCC)    EF 25-95%, grade 1 diastolic dysfunction per echo 04/2015  . 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 CVA (cerebral infarction) 04/2015   Started Plavix 04/2015  . Depression   . Diabetes ([place]) 2016   Type II. On insulin  . Enchondroma of bone 2011   left femur.   . Fracture, intertrochanteric, right femur ([place])   . History of lower GI bleeding   . Hyperlipidemia   . Hypertension   . Patent foramen ovale 04/2015   Started on warfarin 04/2015  . Protein calorie malnutrition ([place])   . Stercoral ulcer of rectum 05/16/2015    Past Surgical History:  Procedure Laterality Date  . CARDIAC SURGERY     Cath without stent  . COLONOSCOPY N/A 05/15/2015   Procedure: COLONOSCOPY;  Surgeon: Mauri Pole, MD;  Location: [facility] ENDOSCOPY;  Service: Endoscopy;  Laterality: N/A;  . FLEXIBLE SIGMOIDOSCOPY N/A 05/15/2015   Procedure: FLEXIBLE SIGMOIDOSCOPY;  Surgeon: Mauri Pole, MD;  Location: [facility] ENDOSCOPY;  Service: Endoscopy;  Laterality: N/A;   at bedside  . INTRAMEDULLARY (IM) NAIL INTERTROCHANTERIC Right 06/12/2015   Procedure: INTRAMEDULLARY (IM) NAIL RIGHT HIP;  Surgeon: Renette Butters, MD;  Location: [facility];  Service: Orthopedics;  Laterality: Right;  . TEE WITHOUT CARDIOVERSION N/A 05/05/2015   Procedure: TRANSESOPHAGEAL ECHOCARDIOGRAM (TEE);  Surgeon: Dixie Dials, MD;  Location: [facility] ENDOSCOPY;  Service: Cardiovascular;  Laterality: N/A;    There were no vitals filed for this visit.      Subjective Assessment - 11/02/16 1320    Currently in Pain? Yes   Pain Score 5    Pain Location Arm   Pain Orientation Right   Pain Descriptors / Indicators Aching   Pain Type Chronic pain   Pain Onset More than a month ago   Pain Frequency Constant   Aggravating Factors  any movement to the arm   Pain Relieving Factors rubbing hand and rest           [facility] OT Assessment - 11/02/16 0001      Assessment   Diagnosis CVA   Referring Provider Dr.Yan   Onset Date 04/28/15   Prior Therapy PT, OT and ST inpt then HHPT and OT.       Precautions   Precautions Fall     Restrictions   Weight Bearing Restrictions No     Balance Screen   Has the patient fallen in the past 6 months  No     Home  Environment   Family/patient expects to be discharged to: Private residence   Living Arrangements Children  son   Available Help at Discharge Available 24 hours/day  goes to daughter's house on the weekend.   Type of [place] Two level  pt lives on first floor; washes at sink   Bathroom Shower/Tub --  washes at sink does not shower   Bathroom Toilet Standard   Additional Comments 3 in 1 over toilet, uses sink to balance.       Prior Function   Level of Independence Independent   Vocation Retired   Leisure Oceanographer, talk on the phone, visist with friends.     ADL   Eating/Feeding Set up   Grooming Moderate assistance  for hair and for set up for toothbrush   Upper Body Bathing Moderate assistance  son says  pt rushes through/not thorough   Lower Body Bathing + 1 Total assistance   Upper Body Dressing Maximal assistance   Lower Body Dressing Maximal assistance   Toilet Tranfer Moderate assistance   Toileting - Clothing Manipulation Moderate assistance   Toileting -  Hygiene + 1 Total assistance   Where Assessed - Toileting hygiene --  n/a     IADL   Shopping Completely unable to shop   Light Housekeeping Does not participate in any housekeeping tasks   Meal Prep Needs to have meals prepared and served   [employer] on family or friends for transportation   Medication Management Is not capable of dispensing or managing own medication   Financial Management Dependent     Mobility   Mobility Status Needs assist     Written Expression   Dominant Hand Right     Vision - History   Baseline Vision No visual deficits   Additional Comments Pt denies any visual changes     Activity Tolerance   Activity Tolerance Tolerates 10-20 min activity with muiltiple rests     Cognition   Overall Cognitive Status Impaired/Different from baseline  different from pre stroke baseline however stable.    Mini Mental State Exam  Cognition difficult to assess due to severe aphasia.     Attention Sustained   Problem Solving Impaired     Sensation   Light Touch Appears Intact   Hot/Cold Appears Intact     Coordination   Gross Motor Movements are Fluid and Coordinated No   Fine Motor Movements are Fluid and Coordinated No   Other Pt has very little isolated movement in RUE although she does have some flexion and extension in the hand. Pt has significant pain in RUE and also appears apraxic. Son states if she is standing at the sink to wash she will spontaneously move R arm to place it on the counter.      Perception   Perception Impaired  to ber further assessed via functional tasks.   Spatial Orientation pt with poor awareness of where she is in space     Praxis   Praxis Impaired      Edema   Edema mild     Tone   Assessment Location Right Upper Extremity     ROM / Strength   AROM / PROM / Strength AROM;PROM;Strength     AROM   Overall AROM  Deficits   Overall AROM Comments No active movement in RUE except hand flexion/extension     PROM   Overall PROM  Deficits   Overall PROM Comments Shoulder flexion to 20*, shoulder abduction 20*, elbow flexion at 90*, elbow extension -60*, wrist flexion quarter range.  All PROM limited by severe pain and tightness, possible underlying contracture however unable to accurately assess due to severe pain through entire RUE.      Strength   Overall Strength Deficits   Overall Strength Comments unable to accurately assess due altered tone, severe pain however pt with no evidence of isolated movement in R arm only in R hand.      Hand Function   Right Hand Gross Grasp Impaired   Right Hand Grip (lbs) unable to use dynamometer. Pt has 50% of finger flexion actively and full extension - pt does not tolerate PROM for further finger flexion due to pain.      RUE Tone   RUE Tone Hypertonic                           OT Short Term Goals - 11/02/16 1418      OT SHORT TERM GOAL #1   Title Pt and family will be mod I with HEP- 11/30/2016   Status New     OT SHORT TERM GOAL #2   Title Pt will report pain in RUE no greater than 8/10 with PROM home program    Status New     OT SHORT TERM GOAL #3   Title Pt will require mod a for sit to stand/ stand to sit for toilet transfers and LB self care.    Status New     OT SHORT TERM GOAL #4   Title Pt will require no more than supervision for static standing balance with 1 UE support for pre ADL function.     Status New           OT Long Term Goals - 11/02/16 1422      OT LONG TERM GOAL #1   Title Pt and family will be mod I with upgraded HEP prn- 12/28/2016   Status New     OT LONG TERM GOAL #2   Title Pt will rate pain in RUE no greater than 4/10 with home  program and with movement to arm during ADL activity   Status New     OT LONG TERM GOAL #3   Title Pt will be min a for toilet transfers.   Status New     OT LONG TERM GOAL #4   Title Pt will be able to maintain standing balance during simple ADL activity at sink without UE support and with supervision   Status New               Plan - 11/02/16 1424    Clinical Impression Statement Pt is a 71 year old female s/p of L CVA on 04/28/2015 with resultant R spastic hemiplegia.  PMH:  HTN, HLD, DM.  Pt had botox injection to RUE on 10/27/2016.  Pt was in SNF for rehab and now lives at home with her son, stays with daughter on the weekends.  Pt presents with the following deficits that impact basic self care as well as increasing care giver burden:  severe pain in RUE, R dominant spastic hemiplegia, parathesias in RUE, stiffness of RUE, abnormal posture, poor balance, impaired functional mobility, impaired cognition, aphasic, apraxia, perceptual deficits.  Pt will benefit from skilled OT to address these deficts to improve basic independence in self care and mobility  and decrease caregiver burden.     Rehab Potential Fair   Clinical Impairments Affecting Rehab Potential given severity of deficits as well as number of deficits, length since onset of stroke, pain   OT Frequency 2x / week   OT Duration 8 weeks   OT Treatment/Interventions Self-care/ADL training;Electrical Stimulation;Moist Heat;Ultrasound;Therapeutic exercise;Neuromuscular education;DME and/or AE instruction;Passive range of motion;Manual Therapy;Functional Mobility Training;Splinting;Therapeutic activities;Visual/perceptual remediation/compensation;Patient/family education;Balance training   Plan further assess pain in RUE, begin gentle manual therapy to address stiffness, alignment;  funcitonal transfers, balance, sit to stand.   Consulted and Agree with Plan of Care Patient;Family member/caregiver   Family Member Consulted son       Patient will benefit from skilled therapeutic intervention in order to improve the following deficits and impairments:  Abnormal gait, Decreased activity tolerance, Decreased balance, Decreased cognition, Decreased range of motion, Decreased mobility, Decreased knowledge of use of DME, Difficulty walking, Impaired UE functional use, Impaired tone, Pain  Visit Diagnosis: Spastic hemiplegia of right dominant side as late effect of other cerebrovascular disease ([place]) - Plan: Ot plan of care cert/re-cert  Pain in right arm - Plan: Ot plan of care cert/re-cert  Pain in right hand - Plan: Ot plan of care cert/re-cert  Abnormal posture - Plan: Ot plan of care cert/re-cert  Unsteadiness on feet - Plan: Ot plan of care cert/re-cert  Frontal lobe and executive function deficit - Plan: Ot plan of care cert/re-cert  Apraxia - Plan: Ot plan of care cert/re-cert      G-Codes - 88/41/66 1436    Functional Assessment Tool Used (Outpatient only) skilled clinical observation pt unable to do standardized tests   Functional Limitation Self care   Self Care Current Status [phone]) At least 80 percent but less than 100 percent impaired, limited or restricted   Self Care Goal Status (S0109) At least 40 percent but less than 60 percent impaired, limited or restricted      Problem List Patient Active Problem List   Diagnosis Date Noted  . Muscle spasticity 01/20/2016  . Bilateral lower extremity edema 12/23/2015  . Acute confusional state 10/28/2015  . Right spastic hemiparesis ([place]) 10/07/2015  . Right shoulder pain 07/24/2015  . Fracture, intertrochanteric, right femur ([place]) 06/10/2015  . Stercoral ulcer of rectum 05/16/2015  . GI bleeding 05/15/2015  . GI bleed 05/13/2015  . BRBPR (bright red blood per rectum) 05/13/2015  . CHF (congestive heart failure) ([place])   . Diabetes ([place])   . Hypertension   . History of stroke   . Lower GI bleed   . Left-sided cerebrovascular accident (CVA)  ([place]) 04/28/2015    Quay Burow, OTR/L 11/02/2016, 2:38 PM  [facility] [address] [place], [place], [address] Phone: [phone]   Fax:  [phone]  Name: ACHSAH MCQUADE MRN: [id number] Date of Birth: 25-Jun-1946

## 2016-11-02 NOTE — Therapy (Signed)
[facility] [address] [place], [place], [address] Phone: [phone]   Fax:  [phone]  Speech Language Pathology Treatment  Patient Details  Name: [name] MRN: [id number] Date of Birth: 10-03-1945 Referring Provider: Marcial Pacas, MD  Encounter Date: 11/02/2016      End of Session - 11/02/16 1244    Visit Number 4   Number of Visits 9   SLP Start Time 4503   SLP Stop Time  1233   SLP Time Calculation (min) 35 min   Activity Tolerance Patient tolerated treatment well      Past Medical History:  Diagnosis Date  . Acute blood loss anemia   . CHF (congestive heart failure) (HCC)    EF 88-82%, grade 1 diastolic dysfunction per echo 04/2015  . 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 CVA (cerebral infarction) 04/2015   Started Plavix 04/2015  . Depression   . Diabetes ([place]) 2016   Type II. On insulin  . Enchondroma of bone 2011   left femur.   . Fracture, intertrochanteric, right femur ([place])   . History of lower GI bleeding   . Hyperlipidemia   . Hypertension   . Patent foramen ovale 04/2015   Started on warfarin 04/2015  . Protein calorie malnutrition ([place])   . Stercoral ulcer of rectum 05/16/2015    Past Surgical History:  Procedure Laterality Date  . CARDIAC SURGERY     Cath without stent  . COLONOSCOPY N/A 05/15/2015   Procedure: COLONOSCOPY;  Surgeon: Mauri Pole, MD;  Location: [facility] ENDOSCOPY;  Service: Endoscopy;  Laterality: N/A;  . FLEXIBLE SIGMOIDOSCOPY N/A 05/15/2015   Procedure: FLEXIBLE SIGMOIDOSCOPY;  Surgeon: Mauri Pole, MD;  Location: [facility] ENDOSCOPY;  Service: Endoscopy;  Laterality: N/A;  at bedside  . INTRAMEDULLARY (IM) NAIL INTERTROCHANTERIC Right 06/12/2015   Procedure: INTRAMEDULLARY (IM) NAIL RIGHT HIP;  Surgeon: Renette Butters, MD;  Location: [facility];  Service: Orthopedics;  Laterality: Right;  . TEE  WITHOUT CARDIOVERSION N/A 05/05/2015   Procedure: TRANSESOPHAGEAL ECHOCARDIOGRAM (TEE);  Surgeon: Dixie Dials, MD;  Location: [facility] ENDOSCOPY;  Service: Cardiovascular;  Laterality: N/A;    There were no vitals filed for this visit.      Subjective Assessment - 11/02/16 1159    Subjective "My son - he's - he's - (dysfluent)parking the -the- the car."               ADULT SLP TREATMENT - 11/02/16 1200      General Information   Behavior/Cognition Alert;Cooperative;Pleasant mood     Treatment Provided   Treatment provided Cognitive-Linquistic     Pain Assessment   Pain Assessment 0-10   Pain Score 5    Pain Location --  rt shoulder   Pain Descriptors / Indicators Sore     Cognitive-Linquistic Treatment   Treatment focused on Aphasia;Apraxia   Skilled Treatment Son attended therapy with pt today. Reported pt practiced some rote speech with son, pt indicated that [name] tapped on her leg. Pt son has used tapping leg to hopefully facilitate smoother speech at home. SLP educated pt's son on cueing heirarchy, home tasks to perform with pt. Son stated he has made some pages with pictures for pt to point to communicate, SLP told son to cont to have pt use these, which son stated was mostly successful means of communication. See "pt instructions" for full details of session.     Assessment /  Recommendations / Plan   Plan Continue with current plan of care     Progression Toward Goals   Progression toward goals Progressing toward goals          SLP Education - 11/02/16 1244    Education provided Yes   Education Details home tasks, cueing heirarchy, cueing strategies   Person(s) Educated Patient;Child(ren)   Methods Explanation;Demonstration   Comprehension Verbalized understanding;Need further instruction            SLP Long Term Goals - 11/02/16 1347      SLP LONG TERM GOAL #1   Title pt will recite rote speech tasks using speech compensations for fluent speech 70%  success   Time 3   Period Weeks   Status On-going     SLP LONG TERM GOAL #2   Title pt will answer wh ?s with multimodal communication PRN, functionally, with rare min A   Time 3   Period Weeks   Status On-going     SLP LONG TERM GOAL #3   Title pt will communicate wants and needs functionally using speech compensations    Time 3   Period Weeks   Status On-going     SLP LONG TERM GOAL #4   Title pt will respond to family's appropriate cues for fluent speech by improved fluency 50% of the time   Time 3   Period Weeks   Status On-going     SLP LONG TERM GOAL #5   Title pt will perform apraxia/speech HEP with 60% fluent speech with rare min A   Time 3   Period Weeks   Status On-going          Plan - 11/02/16 1337    Clinical Impression Statement Pt presents today with mod-severe neurogenic stuttering/apraxia of speech caused by a CVA 18 months ago (October 2016).  With consistent SLP mod-max cues, pt is able to have fluent speech when performing rote speech tasks today. No carryover to spontaneous speech seen today. Prognosis continues as guarded at this time (see potential considerations, below), however 4 weeks of ST is recommended and reassessment will occur at that time to discover if additional therapy is warranted. Son inquired if pt can attend ST first as opposed to PT as she is fatigued after PT.   Speech Therapy Frequency 2x / week   Duration 4 weeks   Treatment/Interventions Functional tasks;Patient/family education;Compensatory strategies;Oral motor exercises;Internal/external aids;SLP instruction and feedback;Multimodal communcation approach   Potential to Achieve Goals Fair   Potential Considerations Severity of impairments;Other (comment);Cooperation/participation level   SLP Home Exercise Plan word association tasks assigned   Consulted and Agree with Plan of Care Patient;Family member/caregiver   Family Member Consulted son      Patient will benefit from  skilled therapeutic intervention in order to improve the following deficits and impairments:   Verbal apraxia  Aphasia    Problem List Patient Active Problem List   Diagnosis Date Noted  . Muscle spasticity 01/20/2016  . Bilateral lower extremity edema 12/23/2015  . Acute confusional state 10/28/2015  . Right spastic hemiparesis ([place]) 10/07/2015  . Right shoulder pain 07/24/2015  . Fracture, intertrochanteric, right femur ([place]) 06/10/2015  . Stercoral ulcer of rectum 05/16/2015  . GI bleeding 05/15/2015  . GI bleed 05/13/2015  . BRBPR (bright red blood per rectum) 05/13/2015  . CHF (congestive heart failure) ([place])   . Diabetes ([place])   . Hypertension   . History of stroke   . Lower GI  bleed   . Left-sided cerebrovascular accident (CVA) ([place]) 04/28/2015    [facility] ,[place], [place]  11/02/2016, 2:01 PM  [facility] [address] [place], [place], [address] Phone: [phone]   Fax:  [phone]   Name: [name] MRN: [id number] Date of Birth: [birth year]

## 2016-11-02 NOTE — Patient Instructions (Signed)
Piriformis Stretch, Sitting    Sit, one ankle on opposite knee, same-side hand on crossed knee. Push down on left knee, keeping spine straight. Lean torso forward, with flat back, until tension is felt in hamstrings and gluteals of crossed-leg side. Hold _30__ seconds.  Repeat _3__ times per session. Do _3__ sessions per day.  Copyright  VHI. All rights reserved.   HIP: Hamstrings - Short Sitting    Rest leg on raised surface. Keep knee straight. Lift chest and sit upright. Hold _30__ seconds. Repeat with other leg. _3__ reps per set, __3_ sets per day, _7__ days per week  Copyright  VHI. All rights reserved.    FLEXION: Sitting (Active)    Sit with right leg extended. Bend knee and draw foot backward. Complete __3_ sets of __5_ repetitions. Perform _1__ sessions per day.  Copyright  VHI. All rights reserved.

## 2016-11-02 NOTE — Therapy (Signed)
[facility] [address] [place], [place], [address] Phone: [phone]   Fax:  [phone]  Physical Therapy Treatment  Patient Details  Name: [name] MRN: [id number] Date of Birth: Jan 05, 1946 Referring Provider: Dr. Krista Blue  Encounter Date: 11/02/2016      PT End of Session - 11/02/16 1618    Visit Number 4   Number of Visits 17   Date for PT Re-Evaluation 12/19/16   Authorization Type G-CODE AND PROGRESS NOTE EVERY 10TH VISIT. Medicare and BCBS 2nd   PT Start Time 1234  pt with speech   PT Stop Time 1314   PT Time Calculation (min) 40 min   Activity Tolerance Patient tolerated treatment well   Behavior During Therapy WFL for tasks assessed/performed      Past Medical History:  Diagnosis Date  . Acute blood loss anemia   . CHF (congestive heart failure) (HCC)    EF 75-17%, grade 1 diastolic dysfunction per echo 04/2015  . 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 CVA (cerebral infarction) 04/2015   Started Plavix 04/2015  . Depression   . Diabetes ([place]) 2016   Type II. On insulin  . Enchondroma of bone 2011   left femur.   . Fracture, intertrochanteric, right femur ([place])   . History of lower GI bleeding   . Hyperlipidemia   . Hypertension   . Patent foramen ovale 04/2015   Started on warfarin 04/2015  . Protein calorie malnutrition ([place])   . Stercoral ulcer of rectum 05/16/2015    Past Surgical History:  Procedure Laterality Date  . CARDIAC SURGERY     Cath without stent  . COLONOSCOPY N/A 05/15/2015   Procedure: COLONOSCOPY;  Surgeon: Mauri Pole, MD;  Location: [facility] ENDOSCOPY;  Service: Endoscopy;  Laterality: N/A;  . FLEXIBLE SIGMOIDOSCOPY N/A 05/15/2015   Procedure: FLEXIBLE SIGMOIDOSCOPY;  Surgeon: Mauri Pole, MD;  Location: [facility] ENDOSCOPY;  Service: Endoscopy;  Laterality: N/A;  at bedside  . INTRAMEDULLARY (IM) NAIL  INTERTROCHANTERIC Right 06/12/2015   Procedure: INTRAMEDULLARY (IM) NAIL RIGHT HIP;  Surgeon: Renette Butters, MD;  Location: [facility];  Service: Orthopedics;  Laterality: Right;  . TEE WITHOUT CARDIOVERSION N/A 05/05/2015   Procedure: TRANSESOPHAGEAL ECHOCARDIOGRAM (TEE);  Surgeon: Dixie Dials, MD;  Location: [facility] ENDOSCOPY;  Service: Cardiovascular;  Laterality: N/A;    There were no vitals filed for this visit.      Subjective Assessment - 11/02/16 1236    Subjective Pt had botox in R UE last week and pt feels it might be helping. Pt's son provided answers due to aphasia. Pt denied falls or changes.    Pertinent History HTN, DM, CHF, CVA, hx of GI bleeds, acute confusional state, aphasia, chronic R shoulder pain (and L shoulder pain starting), anemia, HLD, patent foramen ovale, hx of fall and R hip fracture in 2016   Patient Stated Goals Walk and txf IND or with minor assistance   Currently in Pain? Yes   Pain Score 5    Pain Location Hand   Pain Orientation Right   Pain Descriptors / Indicators Aching   Pain Type Chronic pain   Pain Onset More than a month ago   Pain Frequency Constant   Aggravating Factors  opening up hand   Pain Relieving Factors rubbing hand and rest            Therex: All therex performed in seated position with  cues and demo for technique and upright posture. Please see pt instructions for details. Pt tolerated therex well, no reports on incr. Pain. Pt also performed B hip marches, with improve ROM with LLE and minimal RLE activation.  Pt required incr. Time during all activities 2/2 fatigue.                       PT Education - 11/02/16 1617    Education provided Yes   Education Details PT added exercises to HEP.    Person(s) Educated Patient;Child(ren)   Methods Explanation;Demonstration;Tactile cues;Verbal cues;Handout   Comprehension Verbalized understanding;Returned demonstration;Need further instruction          PT Short  Term Goals - 10/20/16 1619      PT SHORT TERM GOAL #1   Title Pt will be perform HEP with assist from son to improve balance, flexibility and strength. TARGET DATE FOR ALL STGS: 11/17/16   Status New     PT SHORT TERM GOAL #2   Title Pt will perform STS txfs with mod A in order to improve functional mobility and decr. caregiver burdern.   Status New     PT SHORT TERM GOAL #3   Title Attempt amb. as tolerated and appropriate.   Status New           PT Long Term Goals - 10/20/16 1621      PT LONG TERM GOAL #1   Title Pt and pt's son will verbalize CVA risk factors and s/s in order to reduce risk of additional CVA. TARGET DATE FOR ALL LTGS: 12/15/16   Status New     PT LONG TERM GOAL #2   Title Pt will perform STS txfs with min A to decr. caregiver burden and improve functional mobility.    Status New     PT LONG TERM GOAL #3   Title Pt will be able to stand for 5 minutes while perform dynamic balance activities with min A in order to perform ADLs safely, with caregiver.    Status New     PT LONG TERM GOAL #4   Title Pt will improve SIS: mobility score to >/=37.2 to improve quality of life.    Status New               Plan - 11/02/16 1618    Clinical Impression Statement Today's skilled session focused on adding therex to pt's HEP in order to improve flexibility and strenght. Pt was able to improve technique and required less assist with each rep of LLE activity. Pt was able to perform initial contraction of RLE (hip march and heel slide) after 3-4 reps and PT cues and demo. Continue with POC.    Rehab Potential Fair   Clinical Impairments Affecting Rehab Potential CVA was in 2016 and it is unclear how much pt was actually ambulating with HHPT.   PT Frequency 2x / week   PT Duration 8 weeks   PT Treatment/Interventions ADLs/Self Care Home Management;Biofeedback;Neuromuscular re-education;Balance training;Therapeutic exercise;Therapeutic activities;Functional mobility  training;Gait training;Patient/family education;Orthotic Fit/Training;Vestibular   PT Next Visit Plan Provide CVA education handout, continue to work on strengthening, flexibility and standing balance/posture   Consulted and Agree with Plan of Care Patient      Patient will benefit from skilled therapeutic intervention in order to improve the following deficits and impairments:  Decreased endurance, Pain, Impaired tone, Decreased balance, Decreased mobility, Difficulty walking, Impaired flexibility, Postural dysfunction, Decreased coordination, Decreased knowledge of use of  DME, Decreased strength, Impaired UE functional use  Visit Diagnosis: Hemiplegia and hemiparesis following cerebral infarction affecting right dominant side (HCC)  Other abnormalities of gait and mobility     Problem List Patient Active Problem List   Diagnosis Date Noted  . Muscle spasticity 01/20/2016  . Bilateral lower extremity edema 12/23/2015  . Acute confusional state 10/28/2015  . Right spastic hemiparesis ([place]) 10/07/2015  . Right shoulder pain 07/24/2015  . Fracture, intertrochanteric, right femur ([place]) 06/10/2015  . Stercoral ulcer of rectum 05/16/2015  . GI bleeding 05/15/2015  . GI bleed 05/13/2015  . BRBPR (bright red blood per rectum) 05/13/2015  . CHF (congestive heart failure) ([place])   . Diabetes ([place])   . Hypertension   . History of stroke   . Lower GI bleed   . Left-sided cerebrovascular accident (CVA) ([place]) 04/28/2015    Miller,Jennifer L 11/02/2016, 4:20 PM  [facility] [address] [place], [place], [address] Phone: [phone]   Fax:  [phone]  Name: ANNALINA NEEDLES MRN: [id number] Date of Birth: 10-17-1945  Geoffry Paradise, PT,DPT 11/02/16 4:22 PM Phone: [phone] Fax: [phone]

## 2016-11-05 ENCOUNTER — Ambulatory Visit: Payer: Medicare Other

## 2016-11-09 ENCOUNTER — Ambulatory Visit: Payer: Medicare Other | Admitting: Speech Pathology

## 2016-11-09 ENCOUNTER — Ambulatory Visit: Payer: Medicare Other | Admitting: Physical Therapy

## 2016-11-09 DIAGNOSIS — I69351 Hemiplegia and hemiparesis following cerebral infarction affecting right dominant side: Secondary | ICD-10-CM | POA: Diagnosis not present

## 2016-11-09 DIAGNOSIS — R2681 Unsteadiness on feet: Secondary | ICD-10-CM | POA: Diagnosis not present

## 2016-11-09 DIAGNOSIS — R2689 Other abnormalities of gait and mobility: Secondary | ICD-10-CM

## 2016-11-09 DIAGNOSIS — M79601 Pain in right arm: Secondary | ICD-10-CM | POA: Diagnosis not present

## 2016-11-09 DIAGNOSIS — R482 Apraxia: Secondary | ICD-10-CM

## 2016-11-09 DIAGNOSIS — I69851 Hemiplegia and hemiparesis following other cerebrovascular disease affecting right dominant side: Secondary | ICD-10-CM | POA: Diagnosis not present

## 2016-11-09 DIAGNOSIS — R4701 Aphasia: Secondary | ICD-10-CM

## 2016-11-09 DIAGNOSIS — R293 Abnormal posture: Secondary | ICD-10-CM | POA: Diagnosis not present

## 2016-11-09 NOTE — Therapy (Signed)
[facility] [address] [place], [place], [address] Phone: [phone]   Fax:  [phone]  Physical Therapy Treatment  Patient Details  Name: [name] MRN: [id number] Date of Birth: May 16, 1946 Referring Provider: Dr. Krista Blue  Encounter Date: 11/09/2016      PT End of Session - 11/09/16 1014    Visit Number 5   Number of Visits 17   Date for PT Re-Evaluation 12/19/16   Authorization Type G-CODE AND PROGRESS NOTE EVERY 10TH VISIT. Medicare and BCBS 2nd   PT Start Time [phone]   PT Stop Time 1007  session ended early due to fatigue   PT Time Calculation (min) 34 min   Equipment Utilized During Treatment Gait belt   Activity Tolerance Patient limited by fatigue   Behavior During Therapy WFL for tasks assessed/performed      Past Medical History:  Diagnosis Date  . Acute blood loss anemia   . CHF (congestive heart failure) (HCC)    EF 81-44%, grade 1 diastolic dysfunction per echo 04/2015  . 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 CVA (cerebral infarction) 04/2015   Started Plavix 04/2015  . Depression   . Diabetes ([place]) 2016   Type II. On insulin  . Enchondroma of bone 2011   left femur.   . Fracture, intertrochanteric, right femur ([place])   . History of lower GI bleeding   . Hyperlipidemia   . Hypertension   . Patent foramen ovale 04/2015   Started on warfarin 04/2015  . Protein calorie malnutrition ([place])   . Stercoral ulcer of rectum 05/16/2015    Past Surgical History:  Procedure Laterality Date  . CARDIAC SURGERY     Cath without stent  . COLONOSCOPY N/A 05/15/2015   Procedure: COLONOSCOPY;  Surgeon: Mauri Pole, MD;  Location: [facility] ENDOSCOPY;  Service: Endoscopy;  Laterality: N/A;  . FLEXIBLE SIGMOIDOSCOPY N/A 05/15/2015   Procedure: FLEXIBLE SIGMOIDOSCOPY;  Surgeon: Mauri Pole, MD;  Location: [facility] ENDOSCOPY;  Service: Endoscopy;   Laterality: N/A;  at bedside  . INTRAMEDULLARY (IM) NAIL INTERTROCHANTERIC Right 06/12/2015   Procedure: INTRAMEDULLARY (IM) NAIL RIGHT HIP;  Surgeon: Renette Butters, MD;  Location: [facility];  Service: Orthopedics;  Laterality: Right;  . TEE WITHOUT CARDIOVERSION N/A 05/05/2015   Procedure: TRANSESOPHAGEAL ECHOCARDIOGRAM (TEE);  Surgeon: Dixie Dials, MD;  [facility] ENDOSCOPY;  Service: Cardiovascular;  Laterality: N/A;    There were no vitals filed for this visit.      Subjective Assessment - 11/09/16 0932    Subjective doing well; no complaints.  no falls   Patient is accompained by: Family member   Pertinent History HTN, DM, CHF, CVA, hx of GI bleeds, acute confusional state, aphasia, chronic R shoulder pain (and L shoulder pain starting), anemia, HLD, patent foramen ovale, hx of fall and R hip fracture in 2016   Patient Stated Goals Walk and txf IND or with minor assistance                         [facility] Adult PT Treatment/Exercise - 11/09/16 1002      Transfers   Transfers Sit to Stand;Stand to Sit   Sit to Stand 1: +2 Total assist  pt 30%   Sit to Stand Details Verbal cues for technique;Verbal cues for sequencing;Verbal cues for precautions/safety;Manual facilitation for weight shifting;Manual facilitation for placement;Manual facilitation for weight bearing   Sit  to Stand Details (indicate cue type and reason) poor initiation today   Stand to Sit 1: +2 Total assist  pt 40%   Stand to Sit Details (indicate cue type and reason) Verbal cues for sequencing;Verbal cues for technique;Verbal cues for safe use of DME/AE;Manual facilitation for placement;Manual facilitation for weight bearing;Manual facilitation for weight shifting   Stand to Sit Details pt very rigid with minimal trunk flexion     Balance   Balance Assessed Yes     Static Standing Balance   Static Standing - Balance Support Bilateral upper extremity supported   Static Standing - Level of Assistance  3: Mod assist;4: Min assist   Static Standing - Comment/# of Minutes 2     Knee/Hip Exercises: Stretches   Passive Hamstring Stretch Right;60 seconds;Limitations;3 reps   Piriformis Stretch Right;3 reps;30 seconds   Gastroc Stretch Right;3 reps;30 seconds   Gastroc Stretch Limitations caregiver assisted     Knee/Hip Exercises: Seated   Long Arc Quad Left;10 reps   Heel Slides Limitations attempted with RLE; no active movement   [facility] 10x3sec   Marching Left;10 reps                  PT Short Term Goals - 10/20/16 1619      PT SHORT TERM GOAL #1   Title Pt will be perform HEP with assist from son to improve balance, flexibility and strength. TARGET DATE FOR ALL STGS: 11/17/16   Status New     PT SHORT TERM GOAL #2   Title Pt will perform STS txfs with mod A in order to improve functional mobility and decr. caregiver burdern.   Status New     PT SHORT TERM GOAL #3   Title Attempt amb. as tolerated and appropriate.   Status New           PT Long Term Goals - 10/20/16 1621      PT LONG TERM GOAL #1   Title Pt and pt's son will verbalize CVA risk factors and s/s in order to reduce risk of additional CVA. TARGET DATE FOR ALL LTGS: 12/15/16   Status New     PT LONG TERM GOAL #2   Title Pt will perform STS txfs with min A to decr. caregiver burden and improve functional mobility.    Status New     PT LONG TERM GOAL #3   Title Pt will be able to stand for 5 minutes while perform dynamic balance activities with min A in order to perform ADLs safely, with caregiver.    Status New     PT LONG TERM GOAL #4   Title Pt will improve SIS: mobility score to >/=37.2 to improve quality of life.    Status New               Plan - 11/09/16 1300    Clinical Impression Statement Pt with increased fatigue today and poor tolerance to activity with PT.  Pt with decreased initiation with transfers needing +2 assist today.  Frequently falling asleep and c/o dizziness  and lightheadedness with standing but BP stable (130/80).  Son reports pt was up late last night watching TV.  Continue per POC.   Clinical Impairments Affecting Rehab Potential CVA was in 2016 and it is unclear how much pt was actually ambulating with HHPT.   PT Treatment/Interventions ADLs/Self Care Home Management;Biofeedback;Neuromuscular re-education;Balance training;Therapeutic exercise;Therapeutic activities;Functional mobility training;Gait training;Patient/family education;Orthotic Fit/Training;Vestibular   PT Next Visit Plan Provide  CVA education handout, continue to work on strengthening, flexibility and standing balance/posture   Consulted and Agree with Plan of Care Patient   Family Member Consulted son-Terry      Patient will benefit from skilled therapeutic intervention in order to improve the following deficits and impairments:  Decreased endurance, Pain, Impaired tone, Decreased balance, Decreased mobility, Difficulty walking, Impaired flexibility, Postural dysfunction, Decreased coordination, Decreased knowledge of use of DME, Decreased strength, Impaired UE functional use  Visit Diagnosis: Hemiplegia and hemiparesis following cerebral infarction affecting right dominant side (HCC)  Other abnormalities of gait and mobility     Problem List Patient Active Problem List   Diagnosis Date Noted  . Muscle spasticity 01/20/2016  . Bilateral lower extremity edema 12/23/2015  . Acute confusional state 10/28/2015  . Right spastic hemiparesis ([place]) 10/07/2015  . Right shoulder pain 07/24/2015  . Fracture, intertrochanteric, right femur ([place]) 06/10/2015  . Stercoral ulcer of rectum 05/16/2015  . GI bleeding 05/15/2015  . GI bleed 05/13/2015  . BRBPR (bright red blood per rectum) 05/13/2015  . CHF (congestive heart failure) ([place])   . Diabetes ([place])   . Hypertension   . History of stroke   . Lower GI bleed   . Left-sided cerebrovascular accident (CVA) ([place]) 04/28/2015      Laureen Abrahams, PT, DPT 11/09/16 1:04 PM    [facility] [address] [place], [place], [address] Phone: [phone]   Fax:  [phone]  Name: REDINA ZELLER MRN: [id number] Date of Birth: [birth year]

## 2016-11-09 NOTE — Therapy (Signed)
[facility] [address] [place], [place], [address] Phone: [phone]   Fax:  [phone]  Speech Language Pathology Treatment  Patient Details  Name: [name] MRN: [id number] Date of Birth: 1946/03/12 Referring Provider: Marcial Pacas, MD  Encounter Date: 11/09/2016      End of Session - 11/09/16 1225    Visit Number 5   Number of Visits 9   SLP Start Time 0900   SLP Stop Time  0930   SLP Time Calculation (min) 30 min   Activity Tolerance Patient tolerated treatment well      Past Medical History:  Diagnosis Date  . Acute blood loss anemia   . CHF (congestive heart failure) (HCC)    EF 32-12%, grade 1 diastolic dysfunction per echo 04/2015  . 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 CVA (cerebral infarction) 04/2015   Started Plavix 04/2015  . Depression   . Diabetes ([place]) 2016   Type II. On insulin  . Enchondroma of bone 2011   left femur.   . Fracture, intertrochanteric, right femur ([place])   . History of lower GI bleeding   . Hyperlipidemia   . Hypertension   . Patent foramen ovale 04/2015   Started on warfarin 04/2015  . Protein calorie malnutrition ([place])   . Stercoral ulcer of rectum 05/16/2015    Past Surgical History:  Procedure Laterality Date  . CARDIAC SURGERY     Cath without stent  . COLONOSCOPY N/A 05/15/2015   Procedure: COLONOSCOPY;  Surgeon: Mauri Pole, MD;  Location: [facility] ENDOSCOPY;  Service: Endoscopy;  Laterality: N/A;  . FLEXIBLE SIGMOIDOSCOPY N/A 05/15/2015   Procedure: FLEXIBLE SIGMOIDOSCOPY;  Surgeon: Mauri Pole, MD;  Location: [facility] ENDOSCOPY;  Service: Endoscopy;  Laterality: N/A;  at bedside  . INTRAMEDULLARY (IM) NAIL INTERTROCHANTERIC Right 06/12/2015   Procedure: INTRAMEDULLARY (IM) NAIL RIGHT HIP;  Surgeon: Renette Butters, MD;  Location: [facility];  Service: Orthopedics;  Laterality: Right;  . TEE  WITHOUT CARDIOVERSION N/A 05/05/2015   Procedure: TRANSESOPHAGEAL ECHOCARDIOGRAM (TEE);  Surgeon: Dixie Dials, MD;  Location: [facility] ENDOSCOPY;  Service: Cardiovascular;  Laterality: N/A;    There were no vitals filed for this visit.      Subjective Assessment - 11/09/16 0901    Subjective Pt arrived 15 minutes late   Patient is accompained by: Family member  son, [name]               ADULT SLP TREATMENT - 11/09/16 0902      General Information   Behavior/Cognition Alert;Cooperative;Pleasant mood     Treatment Provided   Treatment provided Cognitive-Linquistic     Pain Assessment   Pain Assessment No/denies pain     Cognitive-Linquistic Treatment   Treatment focused on Aphasia;Apraxia   Skilled Treatment Faciltated automatic speech counting by 1's, 5's, 10's, and 2's with consistent mod visual cues - rote speech - pledge, Lord's prayer, nursery rhymes, Leone Payor with consisitent mod to max A to look at me for placement cues and visual cues. Idiom completion (cloze) with usual mod A. Non verbal - kiss, blow, click, lick, also with usual mod A. Conversation 1-2 word utterances, frerquently dysfluent.      Assessment / Recommendations / Plan   Plan Continue with current plan of care     Progression Toward Goals   Progression toward goals Progressing toward goals  SLP Long Term Goals - 11/09/16 1225      SLP LONG TERM GOAL #1   Title pt will recite rote speech tasks using speech compensations for fluent speech 70% success   Time 2   Period Weeks   Status On-going     SLP LONG TERM GOAL #2   Title pt will answer wh ?s with multimodal communication PRN, functionally, with rare min A   Time 2   Period Weeks   Status On-going     SLP LONG TERM GOAL #3   Title pt will communicate wants and needs functionally using speech compensations    Time 2   Period Weeks   Status On-going     SLP LONG TERM GOAL #4   Title pt will respond to family's  appropriate cues for fluent speech by improved fluency 50% of the time   Time 2   Period Weeks   Status On-going     SLP LONG TERM GOAL #5   Title pt will perform apraxia/speech HEP with 60% fluent speech with rare min A   Time 2   Period Weeks   Status On-going          Plan - 11/09/16 1222    Clinical Impression Statement Pt required consistent mod A for visual, placement and hand cues (for counting) for verbal expression of rote and automatic speech. Conversation 1-2 word utterances, frequent dysfluency. Attempted gestures for basic objects with consistent max A due to UE apraxia.Continue skilled ST to maximize communication.   Speech Therapy Frequency 2x / week   Duration 4 weeks   Treatment/Interventions Functional tasks;Patient/family education;Compensatory strategies;Oral motor exercises;Internal/external aids;SLP instruction and feedback;Multimodal communcation approach   Potential to Achieve Goals Fair   Potential Considerations Severity of impairments;Other (comment);Cooperation/participation level   Consulted and Agree with Plan of Care Patient;Family member/caregiver   Family Member Consulted son,       Patient will benefit from skilled therapeutic intervention in order to improve the following deficits and impairments:   Aphasia  Verbal apraxia    Problem List Patient Active Problem List   Diagnosis Date Noted  . Muscle spasticity 01/20/2016  . Bilateral lower extremity edema 12/23/2015  . Acute confusional state 10/28/2015  . Right spastic hemiparesis ([place]) 10/07/2015  . Right shoulder pain 07/24/2015  . Fracture, intertrochanteric, right femur ([place]) 06/10/2015  . Stercoral ulcer of rectum 05/16/2015  . GI bleeding 05/15/2015  . GI bleed 05/13/2015  . BRBPR (bright red blood per rectum) 05/13/2015  . CHF (congestive heart failure) ([place])   . Diabetes ([place])   . Hypertension   . History of stroke   . Lower GI bleed   . Left-sided cerebrovascular accident  (CVA) ([place]) 04/28/2015    Lovvorn, [place], [place] 11/09/2016, 12:26 PM  [facility] [address] [place], [place], [address] Phone: [phone]   Fax:  [phone]   Name: [name] MRN: [id number] Date of Birth: October 30, 1945

## 2016-11-09 NOTE — Patient Instructions (Signed)
Dorsiflexion: Stretch - Heel Cord / Gastrocnemius    Position (A) Patient: Lie or sit with knee straight. Helper: Cup right heel. Make sure grip is firm. Motion (B) - Helper uses forearm to apply pressure to entire sole of foot, stretching foot toward shin. CAUTION: Stretch should be felt in calf. Do not allow foot to twist. Hold _30__ seconds. Repeat _2-3__ times. Do __1-2_ sessions per day.  Copyright  VHI. All rights reserved.

## 2016-11-11 ENCOUNTER — Ambulatory Visit: Payer: Medicare Other | Admitting: Speech Pathology

## 2016-11-11 ENCOUNTER — Ambulatory Visit: Payer: Medicare Other | Admitting: Physical Therapy

## 2016-11-11 ENCOUNTER — Encounter: Payer: Self-pay | Admitting: Physical Therapy

## 2016-11-11 DIAGNOSIS — R2681 Unsteadiness on feet: Secondary | ICD-10-CM | POA: Diagnosis not present

## 2016-11-11 DIAGNOSIS — I69851 Hemiplegia and hemiparesis following other cerebrovascular disease affecting right dominant side: Secondary | ICD-10-CM | POA: Diagnosis not present

## 2016-11-11 DIAGNOSIS — I69351 Hemiplegia and hemiparesis following cerebral infarction affecting right dominant side: Secondary | ICD-10-CM

## 2016-11-11 DIAGNOSIS — R2689 Other abnormalities of gait and mobility: Secondary | ICD-10-CM

## 2016-11-11 DIAGNOSIS — M79601 Pain in right arm: Secondary | ICD-10-CM | POA: Diagnosis not present

## 2016-11-11 DIAGNOSIS — R293 Abnormal posture: Secondary | ICD-10-CM | POA: Diagnosis not present

## 2016-11-11 DIAGNOSIS — R482 Apraxia: Secondary | ICD-10-CM

## 2016-11-11 DIAGNOSIS — R4701 Aphasia: Secondary | ICD-10-CM

## 2016-11-11 NOTE — Patient Instructions (Signed)
  Practice blowing a kiss Click your tongue (horse) Blow a tissue  Raspberries (stick our your tongue and blow) Whistle umm-humm (yes) Uh uh (no) Ah Ha (yes) Hum Happy birthday or ABC

## 2016-11-11 NOTE — Therapy (Signed)
[facility] [address] [place], [place], [address] Phone: [phone]   Fax:  [phone]  Speech Language Pathology Treatment  Patient Details  Name: [name] MRN: [id number] Date of Birth: 07-27-1945 Referring Provider: Marcial Pacas, MD  Encounter Date: 11/11/2016      End of Session - 11/11/16 1154    Visit Number 6   Number of Visits 9   SLP Start Time 1101   SLP Stop Time  1146   SLP Time Calculation (min) 45 min   Activity Tolerance Patient tolerated treatment well      Past Medical History:  Diagnosis Date  . Acute blood loss anemia   . CHF (congestive heart failure) (HCC)    EF 69-62%, grade 1 diastolic dysfunction per echo 04/2015  . 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 CVA (cerebral infarction) 04/2015   Started Plavix 04/2015  . Depression   . Diabetes ([place]) 2016   Type II. On insulin  . Enchondroma of bone 2011   left femur.   . Fracture, intertrochanteric, right femur ([place])   . History of lower GI bleeding   . Hyperlipidemia   . Hypertension   . Patent foramen ovale 04/2015   Started on warfarin 04/2015  . Protein calorie malnutrition ([place])   . Stercoral ulcer of rectum 05/16/2015    Past Surgical History:  Procedure Laterality Date  . CARDIAC SURGERY     Cath without stent  . COLONOSCOPY N/A 05/15/2015   Procedure: COLONOSCOPY;  Surgeon: Mauri Pole, MD;  Location: [facility] ENDOSCOPY;  Service: Endoscopy;  Laterality: N/A;  . FLEXIBLE SIGMOIDOSCOPY N/A 05/15/2015   Procedure: FLEXIBLE SIGMOIDOSCOPY;  Surgeon: Mauri Pole, MD;  Location: [facility] ENDOSCOPY;  Service: Endoscopy;  Laterality: N/A;  at bedside  . INTRAMEDULLARY (IM) NAIL INTERTROCHANTERIC Right 06/12/2015   Procedure: INTRAMEDULLARY (IM) NAIL RIGHT HIP;  Surgeon: Renette Butters, MD;  Location: [facility];  Service: Orthopedics;  Laterality: Right;  . TEE  WITHOUT CARDIOVERSION N/A 05/05/2015   Procedure: TRANSESOPHAGEAL ECHOCARDIOGRAM (TEE);  Surgeon: Dixie Dials, MD;  Location: [facility] ENDOSCOPY;  Service: Cardiovascular;  Laterality: N/A;    There were no vitals filed for this visit.      Subjective Assessment - 11/11/16 1148    Subjective "We've been practicing naming family members and saying the days of the week"   Patient is accompained by: Family member   Currently in Pain? No/denies               ADULT SLP TREATMENT - 11/11/16 1149      General Information   Behavior/Cognition Alert;Cooperative;Pleasant mood     Treatment Provided   Treatment provided Cognitive-Linquistic     Pain Assessment   Pain Assessment No/denies pain     Cognitive-Linquistic Treatment   Treatment focused on Aphasia;Apraxia;Patient/family/caregiver education   Skilled Treatment Facilitated automatic speech with nursery rhymes with usual min A for placement and some cloze strategies. Nonverbal oral movements (kiss, blow, raspberries, hum, uh-huh, uh-uh) with consistent mod A. Pt read 3-5 word phrases with occasional min A and frequent dysfluencies. Read common phrases and repeated then 3 x with usual min to mod A. Singing with usual mod A Vedia Pereyra Doodle)Encouraged son to practice some of his mom's common sayings/phrases. Modeled cueing for son for oral reading and automatic speech tasks.      Assessment / Recommendations / Plan   Plan Continue with current plan  of care     Progression Toward Goals   Progression toward goals Progressing toward goals          SLP Education - 11/11/16 1153    Education provided Yes   Education Details non verbal oral exercises, oral reading   Person(s) Educated Patient;Spouse   Methods Explanation;Demonstration;Verbal cues;Handout   Comprehension Verbalized understanding;Returned demonstration;Verbal cues required;Need further instruction            SLP Long Term Goals - 11/11/16 [place] #1   Title pt will recite rote speech tasks using speech compensations for fluent speech 70% success   Time 2   Period Weeks   Status On-going     SLP LONG TERM GOAL #2   Title pt will answer wh ?s with multimodal communication PRN, functionally, with rare min A   Time 2   Period Weeks   Status On-going     SLP LONG TERM GOAL #3   Title pt will communicate wants and needs functionally using speech compensations    Time 2   Period Weeks   Status On-going     SLP LONG TERM GOAL #4   Title pt will respond to family's appropriate cues for fluent speech by improved fluency 50% of the time   Time 2   Period Weeks   Status On-going     SLP LONG TERM GOAL #5   Title pt will perform apraxia/speech HEP with 60% fluent speech with rare min A   Time 2   Period Weeks   Status On-going          Plan - 11/11/16 1154    Clinical Impression Statement Pt required consistent mod A for visual, placement and hand cues (for counting) for verbal expression of rote and automatic speech. Conversation 1-2 word utterances, frequent dysfluency. Attempted gestures for basic objects with consistent max A due to UE apraxia.Continue skilled ST to maximize communication.   Speech Therapy Frequency 2x / week   Duration 4 weeks   Treatment/Interventions Functional tasks;Patient/family education;Compensatory strategies;Oral motor exercises;Internal/external aids;SLP instruction and feedback;Multimodal communcation approach   Potential to Achieve Goals Fair   Potential Considerations Severity of impairments;Cooperation/participation level   Consulted and Agree with Plan of Care Patient;Family member/caregiver   Family Member Consulted son,       Patient will benefit from skilled therapeutic intervention in order to improve the following deficits and impairments:   Aphasia  Apraxia    Problem List Patient Active Problem List   Diagnosis Date Noted  . Muscle spasticity 01/20/2016  .  Bilateral lower extremity edema 12/23/2015  . Acute confusional state 10/28/2015  . Right spastic hemiparesis ([place]) 10/07/2015  . Right shoulder pain 07/24/2015  . Fracture, intertrochanteric, right femur ([place]) 06/10/2015  . Stercoral ulcer of rectum 05/16/2015  . GI bleeding 05/15/2015  . GI bleed 05/13/2015  . BRBPR (bright red blood per rectum) 05/13/2015  . CHF (congestive heart failure) ([place])   . Diabetes ([place])   . Hypertension   . History of stroke   . Lower GI bleed   . Left-sided cerebrovascular accident (CVA) ([place]) 04/28/2015    Lovvorn, Annye Rusk MS, [place] 11/11/2016, 11:55 AM  [facility] [address] [place], [place], [address] Phone: [phone]   Fax:  [phone]   Name: [name] MRN: [id number] Date of Birth: September 08, 1945

## 2016-11-12 NOTE — Therapy (Signed)
[facility] [address] [place], [place], [address] Phone: [phone]   Fax:  [phone]  Physical Therapy Treatment  Patient Details  Name: [name] MRN: [id number] Date of Birth: [birth year] Referring Provider: Dr. Krista Blue  Encounter Date: 11/11/2016      PT End of Session - 11/11/16 1023    Visit Number 6   Number of Visits 17   Date for PT Re-Evaluation 12/19/16   Authorization Type G-CODE AND PROGRESS NOTE EVERY 10TH VISIT. Medicare and BCBS 2nd   PT Start Time 1018   PT Stop Time 1100   PT Time Calculation (min) 42 min   Equipment Utilized During Treatment Gait belt   Activity Tolerance Patient tolerated treatment well   Behavior During Therapy WFL for tasks assessed/performed      Past Medical History:  Diagnosis Date  . Acute blood loss anemia   . CHF (congestive heart failure) (HCC)    EF 53-61%, grade 1 diastolic dysfunction per echo 04/2015  . 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 CVA (cerebral infarction) 04/2015   Started Plavix 04/2015  . Depression   . Diabetes ([place]) 2016   Type II. On insulin  . Enchondroma of bone 2011   left femur.   . Fracture, intertrochanteric, right femur ([place])   . History of lower GI bleeding   . Hyperlipidemia   . Hypertension   . Patent foramen ovale 04/2015   Started on warfarin 04/2015  . Protein calorie malnutrition ([place])   . Stercoral ulcer of rectum 05/16/2015    Past Surgical History:  Procedure Laterality Date  . CARDIAC SURGERY     Cath without stent  . COLONOSCOPY N/A 05/15/2015   Procedure: COLONOSCOPY;  Surgeon: Mauri Pole, MD;  [facility] ENDOSCOPY;  Service: Endoscopy;  Laterality: N/A;  . FLEXIBLE SIGMOIDOSCOPY N/A 05/15/2015   Procedure: FLEXIBLE SIGMOIDOSCOPY;  Surgeon: Mauri Pole, MD;  Location: [facility] ENDOSCOPY;  Service: Endoscopy;  Laterality: N/A;  at bedside   . INTRAMEDULLARY (IM) NAIL INTERTROCHANTERIC Right 06/12/2015   Procedure: INTRAMEDULLARY (IM) NAIL RIGHT HIP;  Surgeon: Renette Butters, MD;  Location: [facility];  Service: Orthopedics;  Laterality: Right;  . TEE WITHOUT CARDIOVERSION N/A 05/05/2015   Procedure: TRANSESOPHAGEAL ECHOCARDIOGRAM (TEE);  Surgeon: Dixie Dials, MD;  Location: [facility] ENDOSCOPY;  Service: Cardiovascular;  Laterality: N/A;    There were no vitals filed for this visit.      Subjective Assessment - 11/11/16 1023    Subjective No new complaints Pt denies any falls or pain today.    Patient is accompained by: Family member   Pertinent History HTN, DM, CHF, CVA, hx of GI bleeds, acute confusional state, aphasia, chronic R shoulder pain (and L shoulder pain starting), anemia, HLD, patent foramen ovale, hx of fall and R hip fracture in 2016   Patient Stated Goals Walk and txf IND or with minor assistance   Currently in Pain? No/denies   Pain Score 0-No pain              [facility] Adult PT Treatment/Exercise - 11/11/16 1024      Transfers   Transfers Sit to Stand;Stand to [employer] Transfers   Sit to Stand 3: Mod assist   Sit to Stand Details Verbal cues for technique;Verbal cues for sequencing;Verbal cues for precautions/safety;Manual facilitation for weight shifting;Manual facilitation for placement;Manual facilitation for weight bearing   Sit to Stand Details (indicate  cue type and reason) increased time needed for maximal pt participation, cues/facilitation to scoot to edge of chair prior to standing.    Stand to Sit 3: Mod assist;2: Max assist   Stand to Sit Details (indicate cue type and reason) Verbal cues for sequencing;Verbal cues for technique;Verbal cues for safe use of DME/AE;Manual facilitation for placement;Manual facilitation for weight bearing;Manual facilitation for weight shifting   Stand to Sit Details cues/assist for controlled descent with sitting to mat with cues to reach back and use arm to  assist with sitting down.    Stand Pivot Transfers 3: Mod assist   Stand Pivot Transfer Details (indicate cue type and reason) from wheelchair to mat table. cues on sequencing, posture and LE advancement toward mat     Ambulation/Gait   Ambulation/Gait Yes   Ambulation/Gait Assistance 4: Min assist;3: Mod assist  of 2 people   Ambulation/Gait Assistance Details 2 person assist with no AD/HHA with arms around pt's back. cues/facilitation for lateral weight shifting, sequencing of gait and posture. max to total assist to advance right leg on 1st gait rep. mod assist on 2cd with addition of simulated toe cap with noticable initiation of knee flexion with pt attempts to advance right leg. max cues/facilitaiton needed for hip/knee flexion to sit to wheelchair after each gait rep as well.                      Ambulation Distance (Feet) 8 Feet  z1, 12 x1   Assistive device 2 person hand held assist;Other (Comment)   Gait Pattern Step-to pattern;Decreased stride length;Decreased step length - right;Decreased step length - left;Decreased arm swing - right;Decreased arm swing - left;Decreased hip/knee flexion - right;Decreased dorsiflexion - right;Decreased weight shift to left;Decreased weight shift to right;Ataxic;Decreased trunk rotation;Trunk flexed;Narrow base of support;Poor foot clearance - right   Ambulation Surface Level;Indoor   Pre-Gait Activities static standing next to mat with mod assist initially downgrading to min assist for balance. progressed to left foot stepping fwd/bwd with min/mod assist for balance.     Knee/Hip Exercises: Supine   Heel Slides PROM;Right;1 set;10 reps;Limitations   Heel Slides Limitations manual assist with cues with flexion holds for quad stretching   Bridges Limitations bil legs 2 sets of 10 reps with cues on technique and to lift hips as high as she can.             PT Short Term Goals - 10/20/16 1619      PT SHORT TERM GOAL #1   Title Pt will be  perform HEP with assist from son to improve balance, flexibility and strength. TARGET DATE FOR ALL STGS: 11/17/16   Status New     PT SHORT TERM GOAL #2   Title Pt will perform STS txfs with mod A in order to improve functional mobility and decr. caregiver burdern.   Status New     PT SHORT TERM GOAL #3   Title Attempt amb. as tolerated and appropriate.   Status New           PT Long Term Goals - 10/20/16 1621      PT LONG TERM GOAL #1   Title Pt and pt's son will verbalize CVA risk factors and s/s in order to reduce risk of additional CVA. TARGET DATE FOR ALL LTGS: 12/15/16   Status New     PT LONG TERM GOAL #2   Title Pt will perform STS txfs with min A to decr.  caregiver burden and improve functional mobility.    Status New     PT LONG TERM GOAL #3   Title Pt will be able to stand for 5 minutes while perform dynamic balance activities with min A in order to perform ADLs safely, with caregiver.    Status New     PT LONG TERM GOAL #4   Title Pt will improve SIS: mobility score to >/=37.2 to improve quality of life.    Status New            Plan - 11/11/16 1023    Clinical Impression Statement Today's skilled session continued to work on stretching, LE/core strenghtening, general mobility and intitiated gait training with 2 person assistance. 2cd rep of gait improved with simulated toe cap which made advancement of right LE easier, pt however still required up to mod assist with LE advancement. Pt is progressing towards goals and should benefit from continued PT to progress toward unmet goals.                        Clinical Impairments Affecting Rehab Potential CVA was in 2016 and it is unclear how much pt was actually ambulating with HHPT.   PT Treatment/Interventions ADLs/Self Care Home Management;Biofeedback;Neuromuscular re-education;Balance training;Therapeutic exercise;Therapeutic activities;Functional mobility training;Gait training;Patient/family education;Orthotic  Fit/Training;Vestibular   PT Next Visit Plan Provide CVA education handout, continue to work on strengthening, flexibility and standing balance/posture;gait with simulated toe cap/2 person assistance   Consulted and Agree with Plan of Care Patient   Family Member Consulted son-Terry      Patient will benefit from skilled therapeutic intervention in order to improve the following deficits and impairments:  Decreased endurance, Pain, Impaired tone, Decreased balance, Decreased mobility, Difficulty walking, Impaired flexibility, Postural dysfunction, Decreased coordination, Decreased knowledge of use of DME, Decreased strength, Impaired UE functional use  Visit Diagnosis: Hemiplegia and hemiparesis following cerebral infarction affecting right dominant side (HCC)  Other abnormalities of gait and mobility  Abnormal posture  Unsteadiness on feet  Spastic hemiplegia of right dominant side as late effect of other cerebrovascular disease ([place])     Problem List Patient Active Problem List   Diagnosis Date Noted  . Muscle spasticity 01/20/2016  . Bilateral lower extremity edema 12/23/2015  . Acute confusional state 10/28/2015  . Right spastic hemiparesis ([place]) 10/07/2015  . Right shoulder pain 07/24/2015  . Fracture, intertrochanteric, right femur ([place]) 06/10/2015  . Stercoral ulcer of rectum 05/16/2015  . GI bleeding 05/15/2015  . GI bleed 05/13/2015  . BRBPR (bright red blood per rectum) 05/13/2015  . CHF (congestive heart failure) ([place])   . Diabetes ([place])   . Hypertension   . History of stroke   . Lower GI bleed   . Left-sided cerebrovascular accident (CVA) ([place]) 04/28/2015    Willow Ora, PTA, [facility] [address], [place], [place] [address] [phone] 11/12/16, 12:17 PM   Name: [name] MRN: [id number] Date of Birth: 1945/09/04

## 2016-11-16 ENCOUNTER — Ambulatory Visit: Payer: Medicare Other

## 2016-11-16 ENCOUNTER — Encounter: Payer: Self-pay | Admitting: Occupational Therapy

## 2016-11-16 ENCOUNTER — Ambulatory Visit: Payer: Medicare Other | Admitting: Occupational Therapy

## 2016-11-16 DIAGNOSIS — M79641 Pain in right hand: Secondary | ICD-10-CM

## 2016-11-16 DIAGNOSIS — R2689 Other abnormalities of gait and mobility: Secondary | ICD-10-CM

## 2016-11-16 DIAGNOSIS — R2681 Unsteadiness on feet: Secondary | ICD-10-CM

## 2016-11-16 DIAGNOSIS — I69351 Hemiplegia and hemiparesis following cerebral infarction affecting right dominant side: Secondary | ICD-10-CM | POA: Diagnosis not present

## 2016-11-16 DIAGNOSIS — M79601 Pain in right arm: Secondary | ICD-10-CM | POA: Diagnosis not present

## 2016-11-16 DIAGNOSIS — I69851 Hemiplegia and hemiparesis following other cerebrovascular disease affecting right dominant side: Secondary | ICD-10-CM

## 2016-11-16 DIAGNOSIS — R293 Abnormal posture: Secondary | ICD-10-CM

## 2016-11-16 DIAGNOSIS — R4701 Aphasia: Secondary | ICD-10-CM

## 2016-11-16 DIAGNOSIS — R482 Apraxia: Secondary | ICD-10-CM

## 2016-11-16 DIAGNOSIS — R41844 Frontal lobe and executive function deficit: Secondary | ICD-10-CM

## 2016-11-16 NOTE — Therapy (Signed)
[facility] [address] [place], [place], [address] Phone: [phone]   Fax:  [phone]  Speech Language Pathology Treatment  Patient Details  Name: [name] MRN: [id number] Date of Birth: [birth year] Referring Provider: Marcial Pacas, MD  Encounter Date: 11/16/2016      End of Session - 11/16/16 1345    Visit Number 7   Number of Visits 9   SLP Start Time 1024   SLP Stop Time  4259   SLP Time Calculation (min) 40 min   Activity Tolerance Patient tolerated treatment well      Past Medical History:  Diagnosis Date  . Acute blood loss anemia   . CHF (congestive heart failure) (HCC)    EF 56-38%, grade 1 diastolic dysfunction per echo 04/2015  . 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 CVA (cerebral infarction) 04/2015   Started Plavix 04/2015  . Depression   . Diabetes ([place]) 2016   Type II. On insulin  . Enchondroma of bone 2011   left femur.   . Fracture, intertrochanteric, right femur ([place])   . History of lower GI bleeding   . Hyperlipidemia   . Hypertension   . Patent foramen ovale 04/2015   Started on warfarin 04/2015  . Protein calorie malnutrition ([place])   . Stercoral ulcer of rectum 05/16/2015    Past Surgical History:  Procedure Laterality Date  . CARDIAC SURGERY     Cath without stent  . COLONOSCOPY N/A 05/15/2015   Procedure: COLONOSCOPY;  Surgeon: Mauri Pole, MD;  [facility] ENDOSCOPY;  Service: Endoscopy;  Laterality: N/A;  . FLEXIBLE SIGMOIDOSCOPY N/A 05/15/2015   Procedure: FLEXIBLE SIGMOIDOSCOPY;  Surgeon: Mauri Pole, MD;  Location: [facility] ENDOSCOPY;  Service: Endoscopy;  Laterality: N/A;  at bedside  . INTRAMEDULLARY (IM) NAIL INTERTROCHANTERIC Right 06/12/2015   Procedure: INTRAMEDULLARY (IM) NAIL RIGHT HIP;  Surgeon: Renette Butters, MD;  Location: [facility];  Service: Orthopedics;  Laterality: Right;  . TEE  WITHOUT CARDIOVERSION N/A 05/05/2015   Procedure: TRANSESOPHAGEAL ECHOCARDIOGRAM (TEE);  Surgeon: Dixie Dials, MD;  Location[facility] ENDOSCOPY;  Service: Cardiovascular;  Laterality: N/A;    There were no vitals filed for this visit.      Subjective Assessment - 11/16/16 1041    Subjective Pt's son stated pt was at daughter's house for Mother's Day - pt stated she practiced there.   Patient is accompained by: --  son   Currently in Pain? No/denies               ADULT SLP TREATMENT - 11/16/16 1043      General Information   Behavior/Cognition Alert;Cooperative;Pleasant mood     Cognitive-Linquistic Treatment   Treatment focused on Aphasia;Apraxia;Patient/family/caregiver education   Skilled Treatment SLP facilitated automatic speech with months, days of week, and ABCs. SLP req'd to provide initial cues with consistent mod-max cues initially and then faded to occasional min-mod cues. SLP also simultaneously said nursery rhymes with pt, faded to usual min A for placement. Nonverbal oral movements (kiss, blow, raspberries, hum, uh-huh, uh-uh) with consistent mod-max A. Singing with usual min A (Happy Birthday, Stanton Kidney had a little lamb, Twinkle Twinkle). Encouraged son to practice all previously suggested items with pt, and educated re: cue heirarchy, after SLP modeled cueing for son for automatic speech tasks and singing. Educated pt's son that pt may be able to ultimately speak some 2-3 word phrases in order to communicate  with family.      Assessment / Recommendations / Plan   Plan Continue with current plan of care     Progression Toward Goals   Progression toward goals Progressing toward goals          SLP Education - 11/16/16 1344    Education provided Yes   Education Details cueing heirarchy, 2-3 word phrases to communicate (end goal)   Person(s) Educated Patient;Child(ren)   Methods Explanation;Demonstration;Verbal cues   Comprehension Verbalized understanding;Verbal cues  required;Need further instruction            SLP Long Term Goals - 11/16/16 1349      SLP LONG TERM GOAL #1   Title pt will recite rote speech tasks using speech compensations for fluent speech 70% success   Time 1   Period Weeks   Status On-going     SLP LONG TERM GOAL #2   Title pt will answer wh ?s with multimodal communication PRN, functionally, with rare min A   Time 1   Period Weeks   Status On-going     SLP LONG TERM GOAL #3   Title pt will communicate wants and needs functionally using speech compensations    Time 1   Period Weeks   Status On-going     SLP LONG TERM GOAL #4   Title pt will respond to family's appropriate cues for fluent speech by improved fluency 50% of the time   Time 1   Period Weeks   Status On-going     SLP LONG TERM GOAL #5   Title pt will perform apraxia/speech HEP with 60% fluent speech with rare min A   Time 1   Period Weeks   Status On-going          Plan - 11/16/16 1348    Clinical Impression Statement Pt required A for verbal expression of rote and automatic speech. Frequent and significant dysfluency noted with all spontaneous verbal expression. Continue skilled ST to maximize communication.   Speech Therapy Frequency 2x / week   Duration 4 weeks   Treatment/Interventions Functional tasks;Patient/family education;Compensatory strategies;Oral motor exercises;Internal/external aids;SLP instruction and feedback;Multimodal communcation approach   Potential to Achieve Goals Fair   Potential Considerations Severity of impairments;Cooperation/participation level   Consulted and Agree with Plan of Care Patient;Family member/caregiver   Family Member Consulted son,       Patient will benefit from skilled therapeutic intervention in order to improve the following deficits and impairments:   Aphasia  Apraxia    Problem List Patient Active Problem List   Diagnosis Date Noted  . Muscle spasticity 01/20/2016  . Bilateral lower  extremity edema 12/23/2015  . Acute confusional state 10/28/2015  . Right spastic hemiparesis ([place]) 10/07/2015  . Right shoulder pain 07/24/2015  . Fracture, intertrochanteric, right femur ([place]) 06/10/2015  . Stercoral ulcer of rectum 05/16/2015  . GI bleeding 05/15/2015  . GI bleed 05/13/2015  . BRBPR (bright red blood per rectum) 05/13/2015  . CHF (congestive heart failure) ([place])   . Diabetes ([place])   . Hypertension   . History of stroke   . Lower GI bleed   . Left-sided cerebrovascular accident (CVA) ([place]) 04/28/2015    [facility] ,[place], [place]  11/16/2016, 1:50 PM  [facility] [address] [place], [place], [address] Phone: [phone]   Fax:  [phone]   Name: NIKETA TURNER MRN: [id number] Date of Birth: 1945-11-03

## 2016-11-16 NOTE — Patient Instructions (Addendum)
  Add Stanton Kidney Had a Little Lamb to your list of practice songs including Happy Birthday, ABC, Twinkle Twinkle Little Star  Remember what we talked about today about providing less and less help as your mom gets better and better with all of these talking tasks we are doing in therapy.

## 2016-11-16 NOTE — Therapy (Signed)
[facility] [address] [place], [place], [address] Phone: [phone]   Fax:  [phone]  Physical Therapy Treatment  Patient Details  Name: [name] MRN: [id number] Date of Birth: [birth year] Referring Provider: Dr. Krista Blue  Encounter Date: 11/16/2016      PT End of Session - 11/16/16 1619    Visit Number 7   Number of Visits 17   Date for PT Re-Evaluation 12/19/16   Authorization Type G-CODE AND PROGRESS NOTE EVERY 10TH VISIT. Medicare and BCBS 2nd   PT Start Time 1103   PT Stop Time 1148   PT Time Calculation (min) 45 min   Equipment Utilized During Treatment --  2 person assist   Activity Tolerance Patient limited by fatigue;Patient tolerated treatment well   Behavior During Therapy [facility] for tasks assessed/performed      Past Medical History:  Diagnosis Date  . Acute blood loss anemia   . CHF (congestive heart failure) (HCC)    EF 71-24%, grade 1 diastolic dysfunction per echo 04/2015  . 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 CVA (cerebral infarction) 04/2015   Started Plavix 04/2015  . Depression   . Diabetes ([place]) 2016   Type II. On insulin  . Enchondroma of bone 2011   left femur.   . Fracture, intertrochanteric, right femur ([place])   . History of lower GI bleeding   . Hyperlipidemia   . Hypertension   . Patent foramen ovale 04/2015   Started on warfarin 04/2015  . Protein calorie malnutrition ([place])   . Stercoral ulcer of rectum 05/16/2015    Past Surgical History:  Procedure Laterality Date  . CARDIAC SURGERY     Cath without stent  . COLONOSCOPY N/A 05/15/2015   Procedure: COLONOSCOPY;  Surgeon: Mauri Pole, MD;  Location: [facility] ENDOSCOPY;  Service: Endoscopy;  Laterality: N/A;  . FLEXIBLE SIGMOIDOSCOPY N/A 05/15/2015   Procedure: FLEXIBLE SIGMOIDOSCOPY;  Surgeon: Mauri Pole, MD;  Location: [facility] ENDOSCOPY;  Service:  Endoscopy;  Laterality: N/A;  at bedside  . INTRAMEDULLARY (IM) NAIL INTERTROCHANTERIC Right 06/12/2015   Procedure: INTRAMEDULLARY (IM) NAIL RIGHT HIP;  Surgeon: Renette Butters, MD;  Location: [facility];  Service: Orthopedics;  Laterality: Right;  . TEE WITHOUT CARDIOVERSION N/A 05/05/2015   Procedure: TRANSESOPHAGEAL ECHOCARDIOGRAM (TEE);  Surgeon: Dixie Dials, MD;  Location: [facility] ENDOSCOPY;  Service: Cardiovascular;  Laterality: N/A;    There were no vitals filed for this visit.      Subjective Assessment - 11/16/16 1106    Subjective Pt's son reports pt has been participating more during txfs and exercises.    Patient is accompained by: Family member   Pertinent History HTN, DM, CHF, CVA, hx of GI bleeds, acute confusional state, aphasia, chronic R shoulder pain (and L shoulder pain starting), anemia, HLD, patent foramen ovale, hx of fall and R hip fracture in 2016   Patient Stated Goals Walk and txf IND or with minor assistance   Currently in Pain? No/denies          Therex: Pt performed seated HEP with son's assist with intermittent cues required by PT for proper technique. Supine HEP not performed in order to trial amb. Please see pt instructions for details. No c/o pain during session.               [facility] Adult PT Treatment/Exercise - 11/16/16 1615      Ambulation/Gait   Ambulation/Gait  Yes   Ambulation/Gait Assistance 3: Mod assist   Ambulation/Gait Assistance Details 2 Person assist without AD but with HHA and PT's arms around pt's back. Pt required manual facilitation to step with RLE during first 8 steps of amb. and progressed to performing stepping forward with RLE IND. Cues and facilitation to improve lateral weight shifting, lifting R hip to clear R foot, sequencing and improving upright Posture. Simulated toe cap donned during second bout of amb with improved RLE advancement noted.  Pt required seated rest breaks 2/2 fatigue.    Ambulation Distance (Feet) 2  Feet  10   Assistive device 2 person hand held assist   Gait Pattern Step-to pattern;Decreased stride length;Decreased step length - right;Decreased step length - left;Decreased arm swing - right;Decreased arm swing - left;Decreased hip/knee flexion - right;Decreased dorsiflexion - right;Decreased weight shift to left;Decreased weight shift to right;Ataxic;Decreased trunk rotation;Trunk flexed;Narrow base of support;Poor foot clearance - right   Ambulation Surface Level;Indoor                PT Education - 11/16/16 1618    Education provided Yes   Education Details Pt performed exercises in seated positions but did not perform in supine in order to trial amb. today. PT reiterated the importance of performing all HEP activities, as pt's son reported they have not performed all HEP. PT educated pt on proper weight shifting during STS txfs and using 1-2-3/rocking momentum to initiate txf.    Person(s) Educated Patient;Child(ren)   Methods Explanation;Demonstration;Tactile cues;Verbal cues;Handout   Comprehension Verbalized understanding;Returned demonstration;Need further instruction          PT Short Term Goals - 11/16/16 1622      PT SHORT TERM GOAL #1   Title Pt will be perform HEP with assist from son to improve balance, flexibility and strength. TARGET DATE FOR ALL STGS: 11/17/16   Status Partially Met     PT SHORT TERM GOAL #2   Title Pt will perform STS txfs with mod A in order to improve functional mobility and decr. caregiver burdern.   Status Partially Met     PT SHORT TERM GOAL #3   Title Attempt amb. as tolerated and appropriate.   Status Achieved           PT Long Term Goals - 11/16/16 1622      PT LONG TERM GOAL #1   Title Pt and pt's son will verbalize CVA risk factors and s/s in order to reduce risk of additional CVA. TARGET DATE FOR ALL LTGS: 12/15/16   Status New     PT LONG TERM GOAL #2   Title Pt will perform STS txfs with min A to decr. caregiver  burden and improve functional mobility.    Status New     PT LONG TERM GOAL #3   Title Pt will be able to stand for 5 minutes while perform dynamic balance activities with min A in order to perform ADLs safely, with caregiver.    Status New     PT LONG TERM GOAL #4   Title Pt will improve SIS: mobility score to >/=37.2 to improve quality of life.    Status New     PT LONG TERM GOAL #5   Title Pt will amb. 59' with LRAD and 1 person mod A to improve functional mobility.    Status New               Plan - 11/16/16 1620  Clinical Impression Statement Pt demonstrated progress, as she partially met STGs 1 and 2 (pt with intermittent ability to perform STS txfs with mod A and then requires max A), pt met STG 3. Pt would continue to benefit from skilled PT to improve safety during functional mobility.    Clinical Impairments Affecting Rehab Potential CVA was in 2016 and it is unclear how much pt was actually ambulating with HHPT.   PT Treatment/Interventions ADLs/Self Care Home Management;Biofeedback;Neuromuscular re-education;Balance training;Therapeutic exercise;Therapeutic activities;Functional mobility training;Gait training;Patient/family education;Orthotic Fit/Training;Vestibular   PT Next Visit Plan Provide CVA education handout, continue to work on strengthening, flexibility and standing balance/posture;gait with simulated toe cap/2 person assistance   Consulted and Agree with Plan of Care Patient   Family Member Consulted son-Terry      Patient will benefit from skilled therapeutic intervention in order to improve the following deficits and impairments:  Decreased endurance, Pain, Impaired tone, Decreased balance, Decreased mobility, Difficulty walking, Impaired flexibility, Postural dysfunction, Decreased coordination, Decreased knowledge of use of DME, Decreased strength, Impaired UE functional use  Visit Diagnosis: Other abnormalities of gait and mobility  Hemiplegia and  hemiparesis following cerebral infarction affecting right dominant side (HCC)  Abnormal posture  Unsteadiness on feet     Problem List Patient Active Problem List   Diagnosis Date Noted  . Muscle spasticity 01/20/2016  . Bilateral lower extremity edema 12/23/2015  . Acute confusional state 10/28/2015  . Right spastic hemiparesis ([place]) 10/07/2015  . Right shoulder pain 07/24/2015  . Fracture, intertrochanteric, right femur ([place]) 06/10/2015  . Stercoral ulcer of rectum 05/16/2015  . GI bleeding 05/15/2015  . GI bleed 05/13/2015  . BRBPR (bright red blood per rectum) 05/13/2015  . CHF (congestive heart failure) ([place])   . Diabetes ([place])   . Hypertension   . History of stroke   . Lower GI bleed   . Left-sided cerebrovascular accident (CVA) ([place]) 04/28/2015    Shalona Harbour L 11/16/2016, 4:24 PM  [facility] [address] [place], [place], [address] Phone: [phone]   Fax:  [phone]  Name: [name] MRN: [id number] Date of Birth: 11-05-1945  Geoffry Paradise, PT,DPT 11/16/16 4:25 PM Phone: [phone] Fax: [phone]

## 2016-11-16 NOTE — Therapy (Signed)
[facility] [address] [place], [place], [address] Phone: [phone]   Fax:  [phone]  Occupational Therapy Treatment  Patient Details  Name: [name] MRN: [id number] Date of Birth: 07-05-1946 Referring Provider: Dr.Yan  Encounter Date: 11/16/2016      OT End of Session - 11/16/16 1704    Visit Number 2   Number of Visits 16   Date for OT Re-Evaluation 12/28/16   Authorization Type medicare, BCBS as secondary.  Pt will need G code and PN every 10th visit   Authorization Time Period 60 days   Authorization - Visit Number 2   Authorization - Number of Visits 10   OT Start Time 1146   OT Stop Time 1231   OT Time Calculation (min) 45 min   Activity Tolerance Patient tolerated treatment well      Past Medical History:  Diagnosis Date  . Acute blood loss anemia   . CHF (congestive heart failure) (HCC)    EF 32-99%, grade 1 diastolic dysfunction per echo 04/2015  . 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 CVA (cerebral infarction) 04/2015   Started Plavix 04/2015  . Depression   . Diabetes ([place]) 2016   Type II. On insulin  . Enchondroma of bone 2011   left femur.   . Fracture, intertrochanteric, right femur ([place])   . History of lower GI bleeding   . Hyperlipidemia   . Hypertension   . Patent foramen ovale 04/2015   Started on warfarin 04/2015  . Protein calorie malnutrition ([place])   . Stercoral ulcer of rectum 05/16/2015    Past Surgical History:  Procedure Laterality Date  . CARDIAC SURGERY     Cath without stent  . COLONOSCOPY N/A 05/15/2015   Procedure: COLONOSCOPY;  Surgeon: Mauri Pole, MD;  Location: [facility] ENDOSCOPY;  Service: Endoscopy;  Laterality: N/A;  . FLEXIBLE SIGMOIDOSCOPY N/A 05/15/2015   Procedure: FLEXIBLE SIGMOIDOSCOPY;  Surgeon: Mauri Pole, MD;  Location: [facility] ENDOSCOPY;  Service: Endoscopy;  Laterality: N/A;   at bedside  . INTRAMEDULLARY (IM) NAIL INTERTROCHANTERIC Right 06/12/2015   Procedure: INTRAMEDULLARY (IM) NAIL RIGHT HIP;  Surgeon: Renette Butters, MD;  Location: [facility];  Service: Orthopedics;  Laterality: Right;  . TEE WITHOUT CARDIOVERSION N/A 05/05/2015   Procedure: TRANSESOPHAGEAL ECHOCARDIOGRAM (TEE);  Surgeon: Dixie Dials, MD;  Location: [facility] ENDOSCOPY;  Service: Cardiovascular;  Laterality: N/A;    There were no vitals filed for this visit.      Subjective Assessment - 11/16/16 1152    Subjective  Yes when asked if her arm hurt    Patient is accompained by: Family member  son   Pertinent History see epic pt s/p L CVA   Patient Stated Goals pt unable to state due to aphasia can answer yes and no   Currently in Pain? Yes   Pain Score 4   without movement   Pain Location Shoulder   Pain Orientation Right   Pain Descriptors / Indicators Aching;Sore   Pain Type Chronic pain   Pain Onset More than a month ago   Pain Frequency Intermittent   Aggravating Factors  movement , walking in PT   Pain Relieving Factors botox really helped                       OT Treatments/Exercises (OP) - 11/16/16 0001      ADLs   ADL  Comments Reviewed all goals and POC - pt and son in agreement.  Also addressed bed positioning for UE and to reduce tone. Son verbalized understanding and took picture of positioning on cell phone.      Neurological Re-education Exercises   Other Weight-Bearing Exercises 2 Neuro re ed to address scooting forward, forward lean, sit to stand, static standing, weight shifting in standing, stand step transfer, and stand to sit. Pt needs max encouragement and mod a/max faciliation for all activities.  Also addressed transition movement of sitting to sidelying to supine back to sititng. Pt with extremem tone throughout entire R side.                  OT Education - 11/16/16 1702    Education provided Yes   Education Details bed positioning for UE  and tone mgmt   Person(s) Educated Patient;Child(ren)   Methods Explanation;Demonstration;Other (comment)  son took picture on cell phone   Comprehension Verbalized understanding          OT Short Term Goals - 11/16/16 1702      OT SHORT TERM GOAL #1   Title Pt and family will be mod I with HEP- 11/30/2016   Status On-going     OT SHORT TERM GOAL #2   Title Pt will report pain in RUE no greater than 8/10 with PROM home program    Status On-going     OT SHORT TERM GOAL #3   Title Pt will require mod a for sit to stand/ stand to sit for toilet transfers and LB self care.    Status On-going     OT SHORT TERM GOAL #4   Title Pt will require no more than supervision for static standing balance with 1 UE support for pre ADL function.     Status On-going           OT Long Term Goals - 11/16/16 1703      OT LONG TERM GOAL #1   Title Pt and family will be mod I with upgraded HEP prn- 12/28/2016   Status On-going     OT LONG TERM GOAL #2   Title Pt will rate pain in RUE no greater than 4/10 with home program and with movement to arm during ADL activity   Status On-going     OT LONG TERM GOAL #3   Title Pt will be min a for toilet transfers.   Status On-going     OT LONG TERM GOAL #4   Title Pt will be able to maintain standing balance during simple ADL activity at sink without UE support and with supervision   Status On-going               Plan - 11/16/16 1703    Clinical Impression Statement Reviewed goals and POC with pt and son and they are in agreement.  Pt will benefit from slow movement, and repetition.    Rehab Potential Fair   Clinical Impairments Affecting Rehab Potential given severity of deficits as well as number of deficits, length since onset of stroke, pain   OT Frequency 2x / week   OT Duration 8 weeks   OT Treatment/Interventions Self-care/ADL training;Electrical Stimulation;Moist Heat;Ultrasound;Therapeutic exercise;Neuromuscular education;DME  and/or AE instruction;Passive range of motion;Manual Therapy;Functional Mobility Training;Splinting;Therapeutic activities;Visual/perceptual remediation/compensation;Patient/family education;Balance training   Plan address pain as able in RUE, begin gentle manual therapy to address stiffness, alignment, functional transfers, balance, sit to stand, balance and stand to sit  Consulted and Agree with Plan of Care Patient;Family member/caregiver   Family Member Consulted son      Patient will benefit from skilled therapeutic intervention in order to improve the following deficits and impairments:  Abnormal gait, Decreased activity tolerance, Decreased balance, Decreased cognition, Decreased range of motion, Decreased mobility, Decreased knowledge of use of DME, Difficulty walking, Impaired UE functional use, Impaired tone, Pain  Visit Diagnosis: Apraxia  Hemiplegia and hemiparesis following cerebral infarction affecting right dominant side (HCC)  Abnormal posture  Unsteadiness on feet  Spastic hemiplegia of right dominant side as late effect of other cerebrovascular disease (HCC)  Pain in right arm  Pain in right hand  Frontal lobe and executive function deficit    Problem List Patient Active Problem List   Diagnosis Date Noted  . Muscle spasticity 01/20/2016  . Bilateral lower extremity edema 12/23/2015  . Acute confusional state 10/28/2015  . Right spastic hemiparesis ([place]) 10/07/2015  . Right shoulder pain 07/24/2015  . Fracture, intertrochanteric, right femur ([place]) 06/10/2015  . Stercoral ulcer of rectum 05/16/2015  . GI bleeding 05/15/2015  . GI bleed 05/13/2015  . BRBPR (bright red blood per rectum) 05/13/2015  . CHF (congestive heart failure) ([place])   . Diabetes ([place])   . Hypertension   . History of stroke   . Lower GI bleed   . Left-sided cerebrovascular accident (CVA) ([place]) 04/28/2015    Quay Burow, OTR/L 11/16/2016, 5:06 PM  [facility] [address] [place], [place], [address] Phone: [phone]   Fax:  [phone]  Name: [name] MRN: [id number] Date of Birth: May 06, 1946

## 2016-11-16 NOTE — Patient Instructions (Signed)
Piriformis Stretch, Sitting    Sit, one ankle on opposite knee, same-side hand on crossed knee. Push down on left knee, keeping spine straight. Lean torso forward, with flat back, until tension is felt in hamstrings and gluteals of crossed-leg side. Hold _30__ seconds.  Repeat _3__ times per session. Do _3__ sessions per day.  Copyright  VHI. All rights reserved.   HIP: Hamstrings - Short Sitting    Rest leg on raised surface. Keep knee straight. Lift chest and sit upright. Hold _30__ seconds. Repeat with other leg. _3__ reps per set, __3_ sets per day, _7__ days per week  Copyright  VHI. All rights reserved.    FLEXION: Sitting (Active)    Sit with right leg extended. Bend knee and draw foot backward. Hold it for 5-10 seconds. Complete __3_ sets of __5_ repetitions. Perform _1-2__ sessions per day.  Copyright  VHI. All rights reserved.   Dorsiflexion: Stretch - Heel Cord / Gastrocnemius    Position (A) Patient: Lie or sit with knee straight. Helper: Cup right heel. Make sure grip is firm. Motion (B) - Helper uses forearm to apply pressure to entire sole of foot, stretching foot toward shin. CAUTION: Stretch should be felt in calf. Do not allow foot to twist. Hold _30__ seconds. Repeat _2-3__ times. Do __1-2_ sessions per day.  Copyright  VHI. All rights reserved.   Bridge    On back: lift hips up and then slowly down. Son will need to help you get your right leg bent and in position.  Repeat _10_ times. Do _1-2___ sessions per day.  http://pm.exer.us/55   Copyright  VHI. All rights reserved.    Lower Trunk Rotation Stretch    Keeping back flat and feet together, rotate knees to left side. Hold __15__ seconds. Then rotate knees to right side, hold for 15 seconds. Only go within pain free ranges. She will need help with getting into position and with moving legs for the stretch. Repeat __3__ times toward each side. Do _1__ sets per session. Do _1-21_  sessions per day. http://orth.exer.us/122   Copyright  VHI. All rights reserved.

## 2016-11-17 ENCOUNTER — Other Ambulatory Visit: Payer: Self-pay | Admitting: Neurology

## 2016-11-19 ENCOUNTER — Ambulatory Visit: Payer: Medicare Other

## 2016-11-19 ENCOUNTER — Ambulatory Visit: Payer: Medicare Other | Admitting: Physical Therapy

## 2016-11-19 ENCOUNTER — Encounter: Payer: Self-pay | Admitting: Physical Therapy

## 2016-11-19 ENCOUNTER — Ambulatory Visit: Payer: Medicare Other | Admitting: Occupational Therapy

## 2016-11-19 DIAGNOSIS — R2689 Other abnormalities of gait and mobility: Secondary | ICD-10-CM | POA: Diagnosis not present

## 2016-11-19 DIAGNOSIS — R4701 Aphasia: Secondary | ICD-10-CM

## 2016-11-19 DIAGNOSIS — R2681 Unsteadiness on feet: Secondary | ICD-10-CM

## 2016-11-19 DIAGNOSIS — R293 Abnormal posture: Secondary | ICD-10-CM | POA: Diagnosis not present

## 2016-11-19 DIAGNOSIS — R482 Apraxia: Secondary | ICD-10-CM

## 2016-11-19 DIAGNOSIS — M79601 Pain in right arm: Secondary | ICD-10-CM | POA: Diagnosis not present

## 2016-11-19 DIAGNOSIS — R41844 Frontal lobe and executive function deficit: Secondary | ICD-10-CM

## 2016-11-19 DIAGNOSIS — I69851 Hemiplegia and hemiparesis following other cerebrovascular disease affecting right dominant side: Secondary | ICD-10-CM

## 2016-11-19 DIAGNOSIS — I69351 Hemiplegia and hemiparesis following cerebral infarction affecting right dominant side: Secondary | ICD-10-CM | POA: Diagnosis not present

## 2016-11-19 DIAGNOSIS — M79641 Pain in right hand: Secondary | ICD-10-CM

## 2016-11-19 NOTE — Therapy (Signed)
[facility] [address] [place], [place], [address] Phone: [phone]   Fax:  [phone]  Occupational Therapy Treatment  Patient Details  Name: [name] MRN: [id number] Date of Birth: 30-Jul-1945 Referring Provider: Dr.Yan  Encounter Date: 11/19/2016      OT End of Session - 11/19/16 1658    Visit Number 3   Number of Visits 16   Date for OT Re-Evaluation 12/28/16   Authorization Type medicare, BCBS as secondary.  Pt will need G code and PN every 10th visit   Authorization Time Period 60 days   Authorization - Visit Number 3   Authorization - Number of Visits 10   OT Start Time 1245   OT Stop Time 1400   OT Time Calculation (min) 41 min   Activity Tolerance Patient limited by pain      Past Medical History:  Diagnosis Date  . Acute blood loss anemia   . CHF (congestive heart failure) (HCC)    EF 80-99%, grade 1 diastolic dysfunction per echo 04/2015  . 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 CVA (cerebral infarction) 04/2015   Started Plavix 04/2015  . Depression   . Diabetes ([place]) 2016   Type II. On insulin  . Enchondroma of bone 2011   left femur.   . Fracture, intertrochanteric, right femur ([place])   . History of lower GI bleeding   . Hyperlipidemia   . Hypertension   . Patent foramen ovale 04/2015   Started on warfarin 04/2015  . Protein calorie malnutrition ([place])   . Stercoral ulcer of rectum 05/16/2015    Past Surgical History:  Procedure Laterality Date  . CARDIAC SURGERY     Cath without stent  . COLONOSCOPY N/A 05/15/2015   Procedure: COLONOSCOPY;  Surgeon: Mauri Pole, MD;  Location: [facility] ENDOSCOPY;  Service: Endoscopy;  Laterality: N/A;  . FLEXIBLE SIGMOIDOSCOPY N/A 05/15/2015   Procedure: FLEXIBLE SIGMOIDOSCOPY;  Surgeon: Mauri Pole, MD;  Location: [facility] ENDOSCOPY;  Service: Endoscopy;  Laterality: N/A;  at  bedside  . INTRAMEDULLARY (IM) NAIL INTERTROCHANTERIC Right 06/12/2015   Procedure: INTRAMEDULLARY (IM) NAIL RIGHT HIP;  Surgeon: Renette Butters, MD;  Location: [facility];  Service: Orthopedics;  Laterality: Right;  . TEE WITHOUT CARDIOVERSION N/A 05/05/2015   Procedure: TRANSESOPHAGEAL ECHOCARDIOGRAM (TEE);  Surgeon: Dixie Dials, MD;  Location: [facility] ENDOSCOPY;  Service: Cardiovascular;  Laterality: N/A;    There were no vitals filed for this visit.      Subjective Assessment - 11/19/16 1656    Subjective  Pt indicates that her right arm hurts   Pertinent History see epic pt s/p L CVA   Patient Stated Goals pt unable to state due to aphasia can answer yes and no   Currently in Pain? Yes   Pain Score --  unable to rate   Pain Orientation Right   Pain Descriptors / Indicators Aching   Pain Type Chronic pain   Pain Onset More than a month ago   Pain Frequency Intermittent   Aggravating Factors  malpositioning   Pain Relieving Factors reposistioning   Multiple Pain Sites No         Treatment: Pt transferred to mat with max assist( 2 person assist for safety). Supine on mat hotpack applied to right shoulder while therapist performed gentle P/ROM, AA/ROM to forearm wrist and hand. Pt assisted with elbow flexion, extension, forearm supination/ pronation and finger flexion/ extension.  Scapular and right shoulder gentle joint mobilization followed by gentle P/ROM shoulder abduction as tolerated by pt. Lower trunk rotation as able, with mod facilitation. Pt demonstrated for her son that she can perform A/ROM/AA/ROM elbow extension reaching forward down her leg, and she can perform supination/ pronation A/ROM and finger flexion/ extension in a small range .Therapist encouraged pt's son to have her perform the small  movements as able at home, but therapist also mentioned that we should never force range of motion due to risk for injury. Pt' son verbalized  understanding.                       OT Short Term Goals - 11/16/16 1702      OT SHORT TERM GOAL #1   Title Pt and family will be mod I with HEP- 11/30/2016   Status On-going     OT SHORT TERM GOAL #2   Title Pt will report pain in RUE no greater than 8/10 with PROM home program    Status On-going     OT SHORT TERM GOAL #3   Title Pt will require mod a for sit to stand/ stand to sit for toilet transfers and LB self care.    Status On-going     OT SHORT TERM GOAL #4   Title Pt will require no more than supervision for static standing balance with 1 UE support for pre ADL function.     Status On-going           OT Long Term Goals - 11/16/16 1703      OT LONG TERM GOAL #1   Title Pt and family will be mod I with upgraded HEP prn- 12/28/2016   Status On-going     OT LONG TERM GOAL #2   Title Pt will rate pain in RUE no greater than 4/10 with home program and with movement to arm during ADL activity   Status On-going     OT LONG TERM GOAL #3   Title Pt will be min a for toilet transfers.   Status On-going     OT LONG TERM GOAL #4   Title Pt will be able to maintain standing balance during simple ADL activity at sink without UE support and with supervision   Status On-going               Plan - 11/19/16 1658    Clinical Impression Statement Pt is progressing slowly towards goals. Pt's son expressed that pt was moving her right arm more at the end of the session.   Rehab Potential Fair   Clinical Impairments Affecting Rehab Potential given severity of deficits as well as number of deficits, length since onset of stroke, pain   OT Frequency 2x / week   OT Duration 8 weeks   OT Treatment/Interventions Self-care/ADL training;Electrical Stimulation;Moist Heat;Ultrasound;Therapeutic exercise;Neuromuscular education;DME and/or AE instruction;Passive range of motion;Manual Therapy;Functional Mobility Training;Splinting;Therapeutic  activities;Visual/perceptual remediation/compensation;Patient/family education;Balance training   Plan continue to address RUE pain, gentle manual therapy to RUE   Consulted and Agree with Plan of Care Patient;Family member/caregiver   Family Member Consulted son      Patient will benefit from skilled therapeutic intervention in order to improve the following deficits and impairments:  Abnormal gait, Decreased activity tolerance, Decreased balance, Decreased cognition, Decreased range of motion, Decreased mobility, Decreased knowledge of use of DME, Difficulty walking, Impaired UE functional use, Impaired tone, Pain  Visit Diagnosis: Spastic hemiplegia of right dominant  side as late effect of other cerebrovascular disease ([place])  Pain in right arm  Pain in right hand  Frontal lobe and executive function deficit    Problem List Patient Active Problem List   Diagnosis Date Noted  . Muscle spasticity 01/20/2016  . Bilateral lower extremity edema 12/23/2015  . Acute confusional state 10/28/2015  . Right spastic hemiparesis ([place]) 10/07/2015  . Right shoulder pain 07/24/2015  . Fracture, intertrochanteric, right femur ([place]) 06/10/2015  . Stercoral ulcer of rectum 05/16/2015  . GI bleeding 05/15/2015  . GI bleed 05/13/2015  . BRBPR (bright red blood per rectum) 05/13/2015  . CHF (congestive heart failure) ([place])   . Diabetes ([place])   . Hypertension   . History of stroke   . Lower GI bleed   . Left-sided cerebrovascular accident (CVA) ([place]) 04/28/2015    Zackarie Chason 11/19/2016, 5:00 PM  [facility] [address] [place], [place], [address] Phone: [phone]   Fax:  [phone]  Name: [name] MRN: [id number] Date of Birth: 1945/10/08

## 2016-11-19 NOTE — Patient Instructions (Signed)
-  Google these if you cant' recall the words [place] and [place]   Listen to [employer] your enjoy x3/week at least- [employer], etc

## 2016-11-19 NOTE — Therapy (Signed)
[facility] [address] [place], [place], [address] Phone: [phone]   Fax:  [phone]  Speech Language Pathology Treatment  Patient Details  Name: [name] MRN: [id number] Date of Birth: Nov 04, 1945 Referring Provider: Marcial Pacas, MD  Encounter Date: 11/19/2016      End of Session - 11/19/16 1352    Visit Number 8   Number of Visits 17   Date for SLP Re-Evaluation 12/24/16   SLP Start Time 1147   SLP Stop Time  1230   SLP Time Calculation (min) 43 min   Activity Tolerance Patient tolerated treatment well      Past Medical History:  Diagnosis Date  . Acute blood loss anemia   . CHF (congestive heart failure) (HCC)    EF 50-93%, grade 1 diastolic dysfunction per echo 04/2015  . 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 CVA (cerebral infarction) 04/2015   Started Plavix 04/2015  . Depression   . Diabetes ([place]) 2016   Type II. On insulin  . Enchondroma of bone 2011   left femur.   . Fracture, intertrochanteric, right femur ([place])   . History of lower GI bleeding   . Hyperlipidemia   . Hypertension   . Patent foramen ovale 04/2015   Started on warfarin 04/2015  . Protein calorie malnutrition ([place])   . Stercoral ulcer of rectum 05/16/2015    Past Surgical History:  Procedure Laterality Date  . CARDIAC SURGERY     Cath without stent  . COLONOSCOPY N/A 05/15/2015   Procedure: COLONOSCOPY;  Surgeon: Mauri Pole, MD;  Location: [facility] ENDOSCOPY;  Service: Endoscopy;  Laterality: N/A;  . FLEXIBLE SIGMOIDOSCOPY N/A 05/15/2015   Procedure: FLEXIBLE SIGMOIDOSCOPY;  Surgeon: Mauri Pole, MD;  Location: [facility] ENDOSCOPY;  Service: Endoscopy;  Laterality: N/A;  at bedside  . INTRAMEDULLARY (IM) NAIL INTERTROCHANTERIC Right 06/12/2015   Procedure: INTRAMEDULLARY (IM) NAIL RIGHT HIP;  Surgeon: Renette Butters, MD;  Location: [facility];  Service:  Orthopedics;  Laterality: Right;  . TEE WITHOUT CARDIOVERSION N/A 05/05/2015   Procedure: TRANSESOPHAGEAL ECHOCARDIOGRAM (TEE);  Surgeon: Dixie Dials, MD;  Location: [facility] ENDOSCOPY;  Service: Cardiovascular;  Laterality: N/A;    There were no vitals filed for this visit.      Subjective Assessment - 11/19/16 1143    Subjective Pt's son reports she has been practicing    Patient is accompained by: Family member               ADULT SLP TREATMENT - 11/19/16 1154      General Information   Behavior/Cognition Alert;Cooperative;Pleasant mood     Treatment Provided   Treatment provided Cognitive-Linquistic     Cognitive-Linquistic Treatment   Treatment focused on Aphasia;Apraxia;Patient/family/caregiver education   Skilled Treatment Rote speech was used to facilitate pt's expressive verbal communication - pt req'd initial min-mod cues, faded to rare min A for slowing speed with DOW, MOY, 1-21, ABC. With nursery rhymes pt req'd occasional min A in unison with SLP and this faded to rare min A over the time of the task. SLP educated pt son where he could find these for pt at home (Google/Wikipedia).      Assessment / Recommendations / Plan   Plan Continue with current plan of care     Progression Toward Goals   Progression toward goals Progressing toward goals          SLP Education - 11/19/16  1351    Education provided Yes   Education Details nursery rhymes   Person(s) Educated Patient;Child(ren)   Methods Explanation   Comprehension Verbalized understanding;Need further instruction            SLP Long Term Goals - 11/19/16 1400      SLP LONG TERM GOAL #1   Title pt will recite rote speech tasks using speech compensations for fluent speech 70% success   Status Achieved     SLP LONG TERM GOAL #2   Title pt will answer wh ?s with multimodal communication PRN, functionally, with rare min A   Time 4   Period Weeks   Status Not Met  and ongoing     SLP LONG TERM  GOAL #3   Title pt will communicate wants and needs functionally using speech compensations    Time 4   Period Weeks   Status Not Met  and ongoing     SLP LONG TERM GOAL #4   Title pt will respond to family's appropriate cues for fluent speech by improved fluency 50% of the time   Time 4   Period Weeks   Status Partially Met  and ongoing     SLP LONG TERM GOAL #5   Title pt will perform apraxia/speech HEP with 60% fluent speech with rare min A   Status Deferred  due to functional practice instead          Plan - 11/19/16 1352    Clinical Impression Statement Pt required min A ultimately for verbal expression of nursery rhymes, and was independent and functional for rote speech. Frequent dysfluency noted with 90% of spontaneous verbal expression. Continue skilled ST x2/week x4 weeks (or 8 more visits total) to maximize communication.   Speech Therapy Frequency 2x / week   Duration 4 weeks  or 8 more visits (visit #17)   Treatment/Interventions Functional tasks;Patient/family education;Compensatory strategies;Oral motor exercises;Internal/external aids;SLP instruction and feedback;Multimodal communcation approach   Potential to Achieve Goals Fair   Potential Considerations Severity of impairments;Cooperation/participation level   Consulted and Agree with Plan of Care Patient;Family member/caregiver   Family Member Consulted son,       Patient will benefit from skilled therapeutic intervention in order to improve the following deficits and impairments:   Verbal apraxia - Plan: SLP plan of care cert/re-cert  Aphasia - Plan: SLP plan of care cert/re-cert    Problem List Patient Active Problem List   Diagnosis Date Noted  . Muscle spasticity 01/20/2016  . Bilateral lower extremity edema 12/23/2015  . Acute confusional state 10/28/2015  . Right spastic hemiparesis ([place]) 10/07/2015  . Right shoulder pain 07/24/2015  . Fracture, intertrochanteric, right femur ([place])  06/10/2015  . Stercoral ulcer of rectum 05/16/2015  . GI bleeding 05/15/2015  . GI bleed 05/13/2015  . BRBPR (bright red blood per rectum) 05/13/2015  . CHF (congestive heart failure) ([place])   . Diabetes ([place])   . Hypertension   . History of stroke   . Lower GI bleed   . Left-sided cerebrovascular accident (CVA) ([place]) 04/28/2015    [facility] ,[place], [place]  11/19/2016, 2:03 PM  [facility] [address] [place], [place], [address] Phone: [phone]   Fax:  [phone]   Name: [name] MRN: [id number] Date of Birth: March 25, 1946

## 2016-11-20 NOTE — Therapy (Signed)
[facility] [address] [place], [place], [address] Phone: [phone]   Fax:  [phone]  Physical Therapy Treatment  Patient Details  Name: [name] MRN: [id number] Date of Birth: 09-23-1945 Referring Provider: Dr. Krista Blue  Encounter Date: 11/19/2016      PT End of Session - 11/19/16 1105    Visit Number 8   Number of Visits 17   Date for PT Re-Evaluation 12/19/16   Authorization Type G-CODE AND PROGRESS NOTE EVERY 10TH VISIT. Medicare and BCBS 2nd   PT Start Time 1102   PT Stop Time 1145   PT Time Calculation (min) 43 min   Equipment Utilized During Treatment Gait belt;Other (comment)  2 person assist with gait   Activity Tolerance Patient limited by fatigue;Patient tolerated treatment well   Behavior During Therapy [facility] for tasks assessed/performed      Past Medical History:  Diagnosis Date  . Acute blood loss anemia   . CHF (congestive heart failure) (HCC)    EF 03-50%, grade 1 diastolic dysfunction per echo 04/2015  . 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 CVA (cerebral infarction) 04/2015   Started Plavix 04/2015  . Depression   . Diabetes ([place]) 2016   Type II. On insulin  . Enchondroma of bone 2011   left femur.   . Fracture, intertrochanteric, right femur ([place])   . History of lower GI bleeding   . Hyperlipidemia   . Hypertension   . Patent foramen ovale 04/2015   Started on warfarin 04/2015  . Protein calorie malnutrition ([place])   . Stercoral ulcer of rectum 05/16/2015    Past Surgical History:  Procedure Laterality Date  . CARDIAC SURGERY     Cath without stent  . COLONOSCOPY N/A 05/15/2015   Procedure: COLONOSCOPY;  Surgeon: Mauri Pole, MD;  Location[facility] END[facility]COPY;  Service: Endoscopy;  Laterality: N/A;  . FLEXIBLE SIGMOIDOSCOPY N/A 05/15/2015   Procedure: FLEXIBLE SIGMOIDOSCOPY;  Surgeon: Mauri Pole, MD;   Location: [facility] ENDOSCOPY;  Service: Endoscopy;  Laterality: N/A;  at bedside  . INTRAMEDULLARY (IM) NAIL INTERTROCHANTERIC Right 06/12/2015   Procedure: INTRAMEDULLARY (IM) NAIL RIGHT HIP;  Surgeon: Renette Butters, MD;  Location: [facility];  Service: Orthopedics;  Laterality: Right;  . TEE WITHOUT CARDIOVERSION N/A 05/05/2015   Procedure: TRANSESOPHAGEAL ECHOCARDIOGRAM (TEE);  Surgeon: Dixie Dials, MD;  Location: [facility] ENDOSCOPY;  Service: Cardiovascular;  Laterality: N/A;    There were no vitals filed for this visit.      Subjective Assessment - 11/19/16 1104    Subjective No new complaints. Pt states she is tired. No falls to report. Son states she has "been working" at home- doing more of the exercises.   Patient is accompained by: Family member   Pertinent History HTN, DM, CHF, CVA, hx of GI bleeds, acute confusional state, aphasia, chronic R shoulder pain (and L shoulder pain starting), anemia, HLD, patent foramen ovale, hx of fall and R hip fracture in 2016   Patient Stated Goals Walk and txf IND or with minor assistance   Currently in Pain? No/denies   Pain Score 0-No pain           [facility] Adult PT Treatment/Exercise - 11/19/16 1106      Transfers   Transfers Sit to Stand;Stand to Sit   Sit to Stand 3: Mod assist;With upper extremity assist;From chair/3-in-1;With armrests   Sit to Stand Details Verbal cues for technique;Verbal  cues for sequencing;Verbal cues for precautions/safety;Manual facilitation for weight shifting;Manual facilitation for placement;Manual facilitation for weight bearing   Sit to Stand Details (indicate cue type and reason) cues/facilitation to scoot to edge of chair, for anterior weight shifting, and to power up by pushing down through legs. able to stand with 1 person assist today.    Stand to Sit 3: Mod assist;With upper extremity assist;To chair/3-in-1;With armrests;Uncontrolled descent   Stand to Sit Details (indicate cue type and reason) Verbal cues for  sequencing;Verbal cues for technique;Verbal cues for safe use of DME/AE;Manual facilitation for placement;Manual facilitation for weight bearing;Manual facilitation for weight shifting   Stand to Sit Details cues for hip/knee flexion to sit down and to reach back with left UE to assist with controlled descent.      Ambulation/Gait   Ambulation/Gait Yes   Ambulation/Gait Assistance 3: Mod assist  2 person min/mod assist   Ambulation/Gait Assistance Details 2 person assistance with arms crossed behind her back and left HHA: facilitation for weight shifting and advancement of right LE;mod/max assist for right LE advancement.                            Ambulation Distance (Feet) 20 Feet  x1   Assistive device 2 person hand held assist   Gait Pattern Step-to pattern;Decreased stride length;Decreased step length - right;Decreased step length - left;Decreased arm swing - right;Decreased arm swing - left;Decreased hip/knee flexion - right;Decreased dorsiflexion - right;Decreased weight shift to left;Decreased weight shift to right;Ataxic;Decreased trunk rotation;Trunk flexed;Narrow base of support;Poor foot clearance - right   Ambulation Surface Level;Indoor     Knee/Hip Exercises: Stretches   Passive Hamstring Stretch Both;2 reps;60 seconds;Limitations   Passive Hamstring Stretch Limitations performed with pt seated in wheelchair   Quad Stretch Right;Limitations   Quad Stretch Limitations seated in wheelchair: passive knee flexion for quad stretch (foot on pillowcase on slide board to allow for easier movements)             PT Short Term Goals - 11/16/16 1622      PT SHORT TERM GOAL #1   Title Pt will be perform HEP with assist from son to improve balance, flexibility and strength. TARGET DATE FOR ALL STGS: 11/17/16   Status Partially Met     PT SHORT TERM GOAL #2   Title Pt will perform STS txfs with mod A in order to improve functional mobility and decr. caregiver burdern.   Status  Partially Met     PT SHORT TERM GOAL #3   Title Attempt amb. as tolerated and appropriate.   Status Achieved           PT Long Term Goals - 11/16/16 1622      PT LONG TERM GOAL #1   Title Pt and pt's son will verbalize CVA risk factors and s/s in order to reduce risk of additional CVA. TARGET DATE FOR ALL LTGS: 12/15/16   Status New     PT LONG TERM GOAL #2   Title Pt will perform STS txfs with min A to decr. caregiver burden and improve functional mobility.    Status New     PT LONG TERM GOAL #3   Title Pt will be able to stand for 5 minutes while perform dynamic balance activities with min A in order to perform ADLs safely, with caregiver.    Status New     PT LONG TERM GOAL #4  Title Pt will improve SIS: mobility score to >/=37.2 to improve quality of life.    Status New     PT LONG TERM GOAL #5   Title Pt will amb. 36' with LRAD and 1 person mod A to improve functional mobility.    Status New           Plan - 11/19/16 1106    Clinical Impression Statement Today's skilled session continued to address flexibility of right LE, transfers and gait. Pt progressed with gait distance and assistance needed: min assist of 2 people for balance with cues for posture, mod/max assist to advance right LE with simulated toe cap on. Pt is progressing and should benefit from continued PT to progress toward unmet goals.    Clinical Impairments Affecting Rehab Potential CVA was in 2016 and it is unclear how much pt was actually ambulating with HHPT.   PT Treatment/Interventions ADLs/Self Care Home Management;Biofeedback;Neuromuscular re-education;Balance training;Therapeutic exercise;Therapeutic activities;Functional mobility training;Gait training;Patient/family education;Orthotic Fit/Training;Vestibular   PT Next Visit Plan Provide CVA education handout, continue to work on strengthening, flexibility and standing balance/posture;gait with simulated toe cap/2 person assistance   Consulted  and Agree with Plan of Care Patient   Family Member Consulted son-Terry      Patient will benefit from skilled therapeutic intervention in order to improve the following deficits and impairments:  Decreased endurance, Pain, Impaired tone, Decreased balance, Decreased mobility, Difficulty walking, Impaired flexibility, Postural dysfunction, Decreased coordination, Decreased knowledge of use of DME, Decreased strength, Impaired UE functional use  Visit Diagnosis: Hemiplegia and hemiparesis following cerebral infarction affecting right dominant side (HCC)  Abnormal posture  Unsteadiness on feet  Spastic hemiplegia of right dominant side as late effect of other cerebrovascular disease ([place])     Problem List Patient Active Problem List   Diagnosis Date Noted  . Muscle spasticity 01/20/2016  . Bilateral lower extremity edema 12/23/2015  . Acute confusional state 10/28/2015  . Right spastic hemiparesis ([place]) 10/07/2015  . Right shoulder pain 07/24/2015  . Fracture, intertrochanteric, right femur ([place]) 06/10/2015  . Stercoral ulcer of rectum 05/16/2015  . GI bleeding 05/15/2015  . GI bleed 05/13/2015  . BRBPR (bright red blood per rectum) 05/13/2015  . CHF (congestive heart failure) ([place])   . Diabetes ([place])   . Hypertension   . History of stroke   . Lower GI bleed   . Left-sided cerebrovascular accident (CVA) ([place]) 04/28/2015    Willow Ora, PTA, [place] [address], [place], [place] [address] [phone] 11/20/16, 4:21 PM   Name: CAROL THEYS MRN: [id number] Date of Birth: January 26, 1946

## 2016-11-22 ENCOUNTER — Ambulatory Visit: Payer: Medicare Other | Admitting: Occupational Therapy

## 2016-11-24 ENCOUNTER — Ambulatory Visit: Payer: Medicare Other | Admitting: Occupational Therapy

## 2016-11-24 DIAGNOSIS — R293 Abnormal posture: Secondary | ICD-10-CM | POA: Diagnosis not present

## 2016-11-24 DIAGNOSIS — R2681 Unsteadiness on feet: Secondary | ICD-10-CM | POA: Diagnosis not present

## 2016-11-24 DIAGNOSIS — R2689 Other abnormalities of gait and mobility: Secondary | ICD-10-CM | POA: Diagnosis not present

## 2016-11-24 DIAGNOSIS — I69851 Hemiplegia and hemiparesis following other cerebrovascular disease affecting right dominant side: Secondary | ICD-10-CM

## 2016-11-24 DIAGNOSIS — M79641 Pain in right hand: Secondary | ICD-10-CM

## 2016-11-24 DIAGNOSIS — M79601 Pain in right arm: Secondary | ICD-10-CM | POA: Diagnosis not present

## 2016-11-24 DIAGNOSIS — I69351 Hemiplegia and hemiparesis following cerebral infarction affecting right dominant side: Secondary | ICD-10-CM | POA: Diagnosis not present

## 2016-11-24 DIAGNOSIS — R41844 Frontal lobe and executive function deficit: Secondary | ICD-10-CM

## 2016-11-24 NOTE — Therapy (Signed)
[facility] [address] [place], [place], [address] Phone: [phone]   Fax:  [phone]  Occupational Therapy Treatment  Patient Details  Name: [name] MRN: [id number] Date of Birth: October 10, 1945 Referring Provider: Dr.Yan  Encounter Date: 11/24/2016      OT End of Session - 11/24/16 2505    Visit Number 4   Number of Visits 16   Date for OT Re-Evaluation 12/28/16   Authorization Type medicare, BCBS as secondary.  Pt will need G code and PN every 10th visit   Authorization Time Period 60 days   Authorization - Visit Number 4   Authorization - Number of Visits 10   OT Start Time 1150   OT Stop Time 1235   OT Time Calculation (min) 45 min   Activity Tolerance Patient tolerated treatment well   Behavior During Therapy [facility]FL for tasks assessed/performed      Past Medical History:  Diagnosis Date  . Acute blood loss anemia   . CHF (congestive heart failure) (HCC)    EF 39-76%, grade 1 diastolic dysfunction per echo 04/2015  . 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 CVA (cerebral infarction) 04/2015   Started Plavix 04/2015  . Depression   . Diabetes ([place]) 2016   Type II. On insulin  . Enchondroma of bone 2011   left femur.   . Fracture, intertrochanteric, right femur ([place])   . History of lower GI bleeding   . Hyperlipidemia   . Hypertension   . Patent foramen ovale 04/2015   Started on warfarin 04/2015  . Protein calorie malnutrition ([place])   . Stercoral ulcer of rectum 05/16/2015    Past Surgical History:  Procedure Laterality Date  . CARDIAC SURGERY     Cath without stent  . COLONOSCOPY N/A 05/15/2015   Procedure: COLONOSCOPY;  Surgeon: Mauri Pole, MD;  Location[facility] END[facility]COPY;  Service: Endoscopy;  Laterality: N/A;  . FLEXIBLE SIGMOIDOSCOPY N/A 05/15/2015   Procedure: FLEXIBLE SIGMOIDOSCOPY;  Surgeon: Mauri Pole, MD;   Location: [facility] ENDOSCOPY;  Service: Endoscopy;  Laterality: N/A;  at bedside  . INTRAMEDULLARY (IM) NAIL INTERTROCHANTERIC Right 06/12/2015   Procedure: INTRAMEDULLARY (IM) NAIL RIGHT HIP;  Surgeon: Renette Butters, MD;  Location: [facility];  Service: Orthopedics;  Laterality: Right;  . TEE WITHOUT CARDIOVERSION N/A 05/05/2015   Procedure: TRANSESOPHAGEAL ECHOCARDIOGRAM (TEE);  Surgeon: Dixie Dials, MD;  Location: [facility] ENDOSCOPY;  Service: Cardiovascular;  Laterality: N/A;    There were no vitals filed for this visit.      Subjective Assessment - 11/24/16 1243    Subjective  Pt indicates that her right arm hurts   Pertinent History see epic pt s/p L CVA   Patient Stated Goals pt unable to state due to aphasia can answer yes and no   Currently in Pain? Yes   Pain Score --  unable to rate   Pain Location Shoulder   Pain Orientation Right   Pain Descriptors / Indicators Aching   Pain Onset More than a month ago   Pain Frequency Intermittent   Aggravating Factors  malpositioning   Pain Relieving Factors repositioning                              OT Education - 11/24/16 1250    Education provided Yes   Education Details splint wear, care and precautions-see pt instructions,  gentle rocking forwards and backwards in seated with right arm resting on leg and sliding right arm forwards on leg for shoulder flexion/ elbow extension pt perfomed while therapist fabricating splint)   Person(s) Educated Patient;Child(ren)  son   Methods Explanation;Demonstration;Verbal cues;Handout;Tactile cues   Comprehension Verbalized understanding;Returned demonstration;Verbal cues required          OT Short Term Goals - 11/16/16 1702      OT SHORT TERM GOAL #1   Title Pt and family will be mod I with HEP- 11/30/2016   Status On-going     OT SHORT TERM GOAL #2   Title Pt will report pain in RUE no greater than 8/10 with PROM home program    Status On-going     OT SHORT TERM GOAL #3    Title Pt will require mod a for sit to stand/ stand to sit for toilet transfers and LB self care.    Status On-going     OT SHORT TERM GOAL #4   Title Pt will require no more than supervision for static standing balance with 1 UE support for pre ADL function.     Status On-going           OT Long Term Goals - 11/16/16 1703      OT LONG TERM GOAL #1   Title Pt and family will be mod I with upgraded HEP prn- 12/28/2016   Status On-going     OT LONG TERM GOAL #2   Title Pt will rate pain in RUE no greater than 4/10 with home program and with movement to arm during ADL activity   Status On-going     OT LONG TERM GOAL #3   Title Pt will be min a for toilet transfers.   Status On-going     OT LONG TERM GOAL #4   Title Pt will be able to maintain standing balance during simple ADL activity at sink without UE support and with supervision   Status On-going               Plan - 11/24/16 1252    Clinical Impression Statement Pt was fitted with a resting hand splint for improved RUe postioning. Pt's son returned demonstration of application.   Rehab Potential Fair   Clinical Impairments Affecting Rehab Potential given severity of deficits as well as number of deficits, length since onset of stroke, pain   OT Duration 8 weeks   OT Treatment/Interventions Self-care/ADL training;Electrical Stimulation;Moist Heat;Ultrasound;Therapeutic exercise;Neuromuscular education;DME and/or AE instruction;Passive range of motion;Manual Therapy;Functional Mobility Training;Splinting;Therapeutic activities;Visual/perceptual remediation/compensation;Patient/family education;Balance training   Plan splint check, gentle body on arm movements   Consulted and Agree with Plan of Care Patient;Family member/caregiver   Family Member Consulted son      Patient will benefit from skilled therapeutic intervention in order to improve the following deficits and impairments:  Abnormal gait, Decreased  activity tolerance, Decreased balance, Decreased cognition, Decreased range of motion, Decreased mobility, Decreased knowledge of use of DME, Difficulty walking, Impaired UE functional use, Impaired tone, Pain  Visit Diagnosis: Spastic hemiplegia of right dominant side as late effect of other cerebrovascular disease (HCC)  Pain in right arm  Pain in right hand  Frontal lobe and executive function deficit    Problem List Patient Active Problem List   Diagnosis Date Noted  . Muscle spasticity 01/20/2016  . Bilateral lower extremity edema 12/23/2015  . Acute confusional state 10/28/2015  . Right spastic hemiparesis ([place]) 10/07/2015  . Right shoulder pain  07/24/2015  . Fracture, intertrochanteric, right femur ([place]) 06/10/2015  . Stercoral ulcer of rectum 05/16/2015  . GI bleeding 05/15/2015  . GI bleed 05/13/2015  . BRBPR (bright red blood per rectum) 05/13/2015  . CHF (congestive heart failure) ([place])   . Diabetes ([place])   . Hypertension   . History of stroke   . Lower GI bleed   . Left-sided cerebrovascular accident (CVA) ([place]) 04/28/2015    Marisue Canion 11/24/2016, 12:53 PM Theone Murdoch, OTR/L [phone] Phone: [phone] 12:55 PM 11/24/16  [facility] [address] [place], [place], [address] Phone: [phone]   Fax:  [phone]  Name: BREALYN BARIL MRN: [id number] Date of Birth: [birth year]

## 2016-11-24 NOTE — Patient Instructions (Signed)
Your Splint This splint should initially be fitted by a healthcare practitioner.  The healthcare practitioner is responsible for providing wearing instructions and precautions to the patient, other healthcare practitioners and care provider involved in the patient's care.  This splint was custom made for you. Please read the following instructions to learn about wearing and caring for your splint.  Precautions Should your splint cause any of the following problems, remove the splint immediately and contact your therapist/physician.  Swelling  Severe Pain  Pressure Areas  Stiffness  Numbness  Do not wear your splint while operating machinery unless it has been fabricated for that purpose.  When To Wear Your Splint Where your splint according to your therapist/physician instructions. 30 mins -1hr minutes at a time for 1-2 times a day If no problems gradually increase wear time during day, once she can wear splint for 4 hrs with no problems she may begin wear at night. If any pressure areas or problems stop wearing splint!!! And bring to therapy  Care and Cleaning of Your Splint 1. Keep your splint away from open flames. 2. Your splint will lose its shape in temperatures over 135 degrees Farenheit, ( in car windows, near radiators, ovens or in hot water).  Never make any adjustments to your splint, if the splint needs adjusting remove it and make an appointment to see your therapist. 3. Your splint, including the cushion liner may be cleaned with soap and lukewarm water.  Do not immerse in hot water over 135 degrees Farenheit. 4. Straps may be washed with soap and water, but do not moisten the self-adhesive portion.

## 2016-11-26 ENCOUNTER — Ambulatory Visit: Payer: Medicare Other | Admitting: Physical Therapy

## 2016-12-01 ENCOUNTER — Ambulatory Visit: Payer: Medicare Other

## 2016-12-01 DIAGNOSIS — I69351 Hemiplegia and hemiparesis following cerebral infarction affecting right dominant side: Secondary | ICD-10-CM

## 2016-12-01 DIAGNOSIS — I69851 Hemiplegia and hemiparesis following other cerebrovascular disease affecting right dominant side: Secondary | ICD-10-CM | POA: Diagnosis not present

## 2016-12-01 DIAGNOSIS — R2689 Other abnormalities of gait and mobility: Secondary | ICD-10-CM

## 2016-12-01 DIAGNOSIS — R2681 Unsteadiness on feet: Secondary | ICD-10-CM | POA: Diagnosis not present

## 2016-12-01 DIAGNOSIS — R293 Abnormal posture: Secondary | ICD-10-CM

## 2016-12-01 DIAGNOSIS — M79601 Pain in right arm: Secondary | ICD-10-CM | POA: Diagnosis not present

## 2016-12-01 NOTE — Therapy (Signed)
[facility] [address] [place], [place], [address] Phone: [phone]   Fax:  [phone]  Physical Therapy Treatment  Patient Details  Name: [name] MRN: [id number] Date of Birth: 24-Dec-1945 Referring Provider: Dr. Krista Blue  Encounter Date: 12/01/2016      PT End of Session - 12/01/16 1629    Visit Number 9   Number of Visits 17   Date for PT Re-Evaluation 12/19/16   Authorization Type G-CODE AND PROGRESS NOTE EVERY 10TH VISIT. Medicare and BCBS 2nd   PT Start Time 1531   PT Stop Time 0973   PT Time Calculation (min) 43 min   Equipment Utilized During Treatment --  assist during gait   Activity Tolerance Patient tolerated treatment well   Behavior During Therapy WFL for tasks assessed/performed      Past Medical History:  Diagnosis Date  . Acute blood loss anemia   . CHF (congestive heart failure) (HCC)    EF 53-29%, grade 1 diastolic dysfunction per echo 04/2015  . 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 CVA (cerebral infarction) 04/2015   Started Plavix 04/2015  . Depression   . Diabetes ([place]) 2016   Type II. On insulin  . Enchondroma of bone 2011   left femur.   . Fracture, intertrochanteric, right femur ([place])   . History of lower GI bleeding   . Hyperlipidemia   . Hypertension   . Patent foramen ovale 04/2015   Started on warfarin 04/2015  . Protein calorie malnutrition ([place])   . Stercoral ulcer of rectum 05/16/2015    Past Surgical History:  Procedure Laterality Date  . CARDIAC SURGERY     Cath without stent  . COLONOSCOPY N/A 05/15/2015   Procedure: COLONOSCOPY;  Surgeon: Mauri Pole, MD;  Location: [facility] ENDOSCOPY;  Service: Endoscopy;  Laterality: N/A;  . FLEXIBLE SIGMOIDOSCOPY N/A 05/15/2015   Procedure: FLEXIBLE SIGMOIDOSCOPY;  Surgeon: Mauri Pole, MD;  Location: [facility] ENDOSCOPY;  Service: Endoscopy;  Laterality:  N/A;  at bedside  . INTRAMEDULLARY (IM) NAIL INTERTROCHANTERIC Right 06/12/2015   Procedure: INTRAMEDULLARY (IM) NAIL RIGHT HIP;  Surgeon: Renette Butters, MD;  Location: [facility];  Service: Orthopedics;  Laterality: Right;  . TEE WITHOUT CARDIOVERSION N/A 05/05/2015   Procedure: TRANSESOPHAGEAL ECHOCARDIOGRAM (TEE);  Surgeon: Dixie Dials, MD;  Location[facility] ENDOSCOPY;  Service: Cardiovascular;  Laterality: N/A;    There were no vitals filed for this visit.      Subjective Assessment - 12/01/16 1536    Subjective Pt denied falls or changes since last visit.    Pertinent History HTN, DM, CHF, CVA, hx of GI bleeds, acute confusional state, aphasia, chronic R shoulder pain (and L shoulder pain starting), anemia, HLD, patent foramen ovale, hx of fall and R hip fracture in 2016   Patient Stated Goals Walk and txf IND or with minor assistance   Currently in Pain? No/denies                         [facility] Adult PT Treatment/Exercise - 12/01/16 1536      Ambulation/Gait   Ambulation/Gait Yes   Ambulation/Gait Assistance 3: Mod assist;2: Max assist   Ambulation/Gait Assistance Details 2 person assist without AD or AFO donned. PT and rehab tech had L HHA and arms crossed behind pt for assist. Cues to facilitate lateral weight shifting, sequencing of gait, upright posture. Pt required total  assist today to advance RLE with simulated toe cap donned. Pt also required total assist to improve wide BOS (adducting RLE towards midline). Pt required 3 seated rest breaks 2/2 fatigue and B knees buckling towards end of gait.   Ambulation Distance (Feet) 5 Feet  x2 and 12'   Assistive device 2 person hand held assist   Gait Pattern Step-to pattern;Decreased stride length;Decreased step length - right;Decreased step length - left;Decreased arm swing - right;Decreased arm swing - left;Decreased hip/knee flexion - right;Decreased dorsiflexion - right;Decreased weight shift to left;Decreased weight  shift to right;Ataxic;Decreased trunk rotation;Trunk flexed;Narrow base of support;Poor foot clearance - right   Ambulation Surface Level;Indoor     Knee/Hip Exercises: Seated   Long Arc Quad AROM;Right;5 reps   Long Arc Quad Limitations Cues for technique, performed prior to gait training.   Heel Slides Limitations Attempted with RLE, no active movement noted during first 5 reps, pt was then able to adduct LE in attempt to perform R heel slides. PT performed AAROM heel slides x10 reps and 5 sec. holds to initiate hamstring activation prior to gait and to perform R quad stretch.                PT Education - 12/01/16 1629    Education provided Yes   Education Details PT discussed the importance of getting sleep at night, as pt's son reported pt likely up all night watching TV last night. Pt very fatigued today.   Person(s) Educated Patient;Child(ren)   Methods Explanation   Comprehension Verbalized understanding          PT Short Term Goals - 11/16/16 1622      PT SHORT TERM GOAL #1   Title Pt will be perform HEP with assist from son to improve balance, flexibility and strength. TARGET DATE FOR ALL STGS: 11/17/16   Status Partially Met     PT SHORT TERM GOAL #2   Title Pt will perform STS txfs with mod A in order to improve functional mobility and decr. caregiver burdern.   Status Partially Met     PT SHORT TERM GOAL #3   Title Attempt amb. as tolerated and appropriate.   Status Achieved           PT Long Term Goals - 11/16/16 1622      PT LONG TERM GOAL #1   Title Pt and pt's son will verbalize CVA risk factors and s/s in order to reduce risk of additional CVA. TARGET DATE FOR ALL LTGS: 12/15/16   Status New     PT LONG TERM GOAL #2   Title Pt will perform STS txfs with min A to decr. caregiver burden and improve functional mobility.    Status New     PT LONG TERM GOAL #3   Title Pt will be able to stand for 5 minutes while perform dynamic balance activities  with min A in order to perform ADLs safely, with caregiver.    Status New     PT LONG TERM GOAL #4   Title Pt will improve SIS: mobility score to >/=37.2 to improve quality of life.    Status New     PT LONG TERM GOAL #5   Title Pt will amb. 77' with LRAD and 1 person mod A to improve functional mobility.    Status New               Plan - 12/01/16 1629    Clinical Impression Statement Today's  skilled session focused on improving RLE muscle activation and flexibility prior to attempting gait. Pt required incr. assist today 2/2 fatigue. Pt likley fatigued today 2/2 staying up all night watching TV. Pt would continue to benefit from skilled PT today to improve safety during functional mobility.    Clinical Impairments Affecting Rehab Potential CVA was in 2016 and it is unclear how much pt was actually ambulating with HHPT.   PT Treatment/Interventions ADLs/Self Care Home Management;Biofeedback;Neuromuscular re-education;Balance training;Therapeutic exercise;Therapeutic activities;Functional mobility training;Gait training;Patient/family education;Orthotic Fit/Training;Vestibular   PT Next Visit Plan G-code. gait with simulated toe cap/2 person assistance; continue to work on strengthening, flexibility and standing balance/posture   Consulted and Agree with Plan of Care Patient   Family Member Consulted son-Terry      Patient will benefit from skilled therapeutic intervention in order to improve the following deficits and impairments:  Decreased endurance, Pain, Impaired tone, Decreased balance, Decreased mobility, Difficulty walking, Impaired flexibility, Postural dysfunction, Decreased coordination, Decreased knowledge of use of DME, Decreased strength, Impaired UE functional use  Visit Diagnosis: Hemiplegia and hemiparesis following cerebral infarction affecting right dominant side (HCC)  Other abnormalities of gait and mobility  Abnormal posture     Problem List Patient  Active Problem List   Diagnosis Date Noted  . Muscle spasticity 01/20/2016  . Bilateral lower extremity edema 12/23/2015  . Acute confusional state 10/28/2015  . Right spastic hemiparesis ([place]) 10/07/2015  . Right shoulder pain 07/24/2015  . Fracture, intertrochanteric, right femur ([place]) 06/10/2015  . Stercoral ulcer of rectum 05/16/2015  . GI bleeding 05/15/2015  . GI bleed 05/13/2015  . BRBPR (bright red blood per rectum) 05/13/2015  . CHF (congestive heart failure) ([place])   . Diabetes ([place])   . Hypertension   . History of stroke   . Lower GI bleed   . Left-sided cerebrovascular accident (CVA) ([place]) 04/28/2015    Addylynn Balin L 12/01/2016, 4:33 PM  [facility] [address] [place], [place], [address] Phone: [phone]   Fax:  [phone]  Name: CECEILIA CEPHUS MRN: [id number] Date of Birth: 1945/08/30  Geoffry Paradise, PT,DPT 12/01/16 4:33 PM Phone: [phone] Fax: [phone]

## 2016-12-02 ENCOUNTER — Encounter: Payer: Self-pay | Admitting: Occupational Therapy

## 2016-12-02 ENCOUNTER — Ambulatory Visit: Payer: Medicare Other | Admitting: Physical Therapy

## 2016-12-02 ENCOUNTER — Ambulatory Visit: Payer: Medicare Other | Admitting: Occupational Therapy

## 2016-12-02 ENCOUNTER — Encounter: Payer: Self-pay | Admitting: Physical Therapy

## 2016-12-02 DIAGNOSIS — R2681 Unsteadiness on feet: Secondary | ICD-10-CM

## 2016-12-02 DIAGNOSIS — I69351 Hemiplegia and hemiparesis following cerebral infarction affecting right dominant side: Secondary | ICD-10-CM | POA: Diagnosis not present

## 2016-12-02 DIAGNOSIS — M79641 Pain in right hand: Secondary | ICD-10-CM

## 2016-12-02 DIAGNOSIS — R293 Abnormal posture: Secondary | ICD-10-CM

## 2016-12-02 DIAGNOSIS — R2689 Other abnormalities of gait and mobility: Secondary | ICD-10-CM | POA: Diagnosis not present

## 2016-12-02 DIAGNOSIS — R41844 Frontal lobe and executive function deficit: Secondary | ICD-10-CM

## 2016-12-02 DIAGNOSIS — I69851 Hemiplegia and hemiparesis following other cerebrovascular disease affecting right dominant side: Secondary | ICD-10-CM

## 2016-12-02 DIAGNOSIS — R482 Apraxia: Secondary | ICD-10-CM

## 2016-12-02 DIAGNOSIS — M79601 Pain in right arm: Secondary | ICD-10-CM

## 2016-12-02 NOTE — Therapy (Signed)
[facility] [address] [place], [place], [address] Phone: [phone]   Fax:  [phone]  Occupational Therapy Treatment  Patient Details  Name: [name] MRN: [id number] Date of Birth: 03/09/1946 Referring Provider: Dr.Yan  Encounter Date: 12/02/2016      OT End of Session - 12/02/16 1157    Visit Number 5   Number of Visits 16   Date for OT Re-Evaluation 01/25/17   Authorization Type medicare, BCBS as secondary.  Pt will need G code and PN every 10th visit   Authorization Time Period 60 days   Authorization - Visit Number 5   Authorization - Number of Visits 10   OT Start Time 1016   OT Stop Time 1101   OT Time Calculation (min) 45 min   Activity Tolerance Patient tolerated treatment well      Past Medical History:  Diagnosis Date  . Acute blood loss anemia   . CHF (congestive heart failure) (HCC)    EF 83-38%, grade 1 diastolic dysfunction per echo 04/2015  . 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 CVA (cerebral infarction) 04/2015   Started Plavix 04/2015  . Depression   . Diabetes ([place]) 2016   Type II. On insulin  . Enchondroma of bone 2011   left femur.   . Fracture, intertrochanteric, right femur ([place])   . History of lower GI bleeding   . Hyperlipidemia   . Hypertension   . Patent foramen ovale 04/2015   Started on warfarin 04/2015  . Protein calorie malnutrition ([place])   . Stercoral ulcer of rectum 05/16/2015    Past Surgical History:  Procedure Laterality Date  . CARDIAC SURGERY     Cath without stent  . COLONOSCOPY N/A 05/15/2015   Procedure: COLONOSCOPY;  Surgeon: Mauri Pole, MD;  Location: [facility] ENDOSCOPY;  Service: Endoscopy;  Laterality: N/A;  . FLEXIBLE SIGMOIDOSCOPY N/A 05/15/2015   Procedure: FLEXIBLE SIGMOIDOSCOPY;  Surgeon: Mauri Pole, MD;  Location: [facility] ENDOSCOPY;  Service: Endoscopy;  Laterality: N/A;   at bedside  . INTRAMEDULLARY (IM) NAIL INTERTROCHANTERIC Right 06/12/2015   Procedure: INTRAMEDULLARY (IM) NAIL RIGHT HIP;  Surgeon: Renette Butters, MD;  Location: [facility];  Service: Orthopedics;  Laterality: Right;  . TEE WITHOUT CARDIOVERSION N/A 05/05/2015   Procedure: TRANSESOPHAGEAL ECHOCARDIOGRAM (TEE);  Surgeon: Dixie Dials, MD;  Location: [facility] ENDOSCOPY;  Service: Cardiovascular;  Laterality: N/A;    There were no vitals filed for this visit.      Subjective Assessment - 12/02/16 1020    Subjective  This is hard   Pertinent History see epic pt s/p L CVA   Patient Stated Goals pt unable to state due to aphasia can answer yes and no   Pain Score 6   with  movement, 3 at rest   Pain Location Shoulder   Pain Orientation Right   Pain Descriptors / Indicators Aching;Sore   Pain Type Chronic pain   Pain Onset More than a month ago   Pain Frequency Constant   Aggravating Factors  malpositioning, tone, movement of any kind   Pain Relieving Factors not moving it, repositioning                      OT Treatments/Exercises (OP) - 12/02/16 1149      Neurological Re-education Exercises   Other Exercises 1 Neuro re ed to address scooting, sit to stand, static and dynamic  standing balance and stepping with LLE with emphasis on alignment, increasing activation of RLE as well as RUE and trunk .  Pt with bilateral UE support inititally but able to stand with just one UE support and max cuees for alignment and postural orientation - pt with significant L bias. With repetition pt improved in ability weight shift onto RLE. Also addressed active stand to sit - pt needs  max facilitation for normal movement pattern. Able to mobilize RUE to some degree by mobilizing body on arm in standing.                    OT Short Term Goals - 12/02/16 1153      OT SHORT TERM GOAL #1   Title Pt and family will be mod I with HEP- 12/28/2016 (pt has only attended OT for 4 visits since  eval therefore STG's will assume LTG goal date of 07/07/7251 and will recertify today   Status On-going     OT SHORT TERM GOAL #2   Title Pt will report pain in RUE no greater than 8/10 with PROM home program    Status On-going     OT SHORT TERM GOAL #3   Title Pt will require mod a for sit to stand/ stand to sit for toilet transfers and LB self care.    Status On-going     OT SHORT TERM GOAL #4   Title Pt will require no more than supervision for static standing balance with 1 UE support for pre ADL function.     Status On-going           OT Long Term Goals - 12/02/16 1155      OT LONG TERM GOAL #1   Title Pt and family will be mod I with upgraded HEP prn- 01/25/2017   Status On-going     OT LONG TERM GOAL #2   Title Pt will rate pain in RUE no greater than 4/10 with home program and with movement to arm during ADL activity   Status On-going     OT LONG TERM GOAL #3   Title Pt will be min a for toilet transfers.   Status On-going     OT LONG TERM GOAL #4   Title Pt will be able to maintain standing balance during simple ADL activity at sink without UE support and with supervision   Status On-going               Plan - 12/02/16 1155    Clinical Impression Statement Pt making very slow progress toward goals due severity of deficits. Pt has also only attended 4 OT sessions since eval therefore will recertify pt today.    Rehab Potential Fair   Current Impairments/barriers affecting progress: given severity of deficits as well as number of deficits, length since onset of stroke, pain   OT Frequency 2x / week   OT Duration 8 weeks   OT Treatment/Interventions Self-care/ADL training;Electrical Stimulation;Moist Heat;Ultrasound;Therapeutic exercise;Neuromuscular education;DME and/or AE instruction;Passive range of motion;Manual Therapy;Functional Mobility Training;Splinting;Therapeutic activities;Visual/perceptual remediation/compensation;Patient/family education;Balance  training   Plan NMR for RUE, trunk, functional mobility, postural orientation, balance, alignment   Consulted and Agree with Plan of Care Patient;Family member/caregiver   Family Member Consulted son      Patient will benefit from skilled therapeutic intervention in order to improve the following deficits and impairments:  Abnormal gait, Decreased activity tolerance, Decreased balance, Decreased cognition, Decreased range of motion, Decreased mobility, Decreased knowledge of  use of DME, Difficulty walking, Impaired UE functional use, Impaired tone, Pain  Visit Diagnosis: Spastic hemiplegia of right dominant side as late effect of other cerebrovascular disease (HCC)  Abnormal posture  Pain in right arm  Pain in right hand  Frontal lobe and executive function deficit  Unsteadiness on feet  Apraxia    Problem List Patient Active Problem List   Diagnosis Date Noted  . Muscle spasticity 01/20/2016  . Bilateral lower extremity edema 12/23/2015  . Acute confusional state 10/28/2015  . Right spastic hemiparesis ([place]) 10/07/2015  . Right shoulder pain 07/24/2015  . Fracture, intertrochanteric, right femur ([place]) 06/10/2015  . Stercoral ulcer of rectum 05/16/2015  . GI bleeding 05/15/2015  . GI bleed 05/13/2015  . BRBPR (bright red blood per rectum) 05/13/2015  . CHF (congestive heart failure) ([place])   . Diabetes ([place])   . Hypertension   . History of stroke   . Lower GI bleed   . Left-sided cerebrovascular accident (CVA) ([place]) 04/28/2015    Quay Burow, OTR/L 12/02/2016, 11:58 AM  [facility] [address] [place], [place], [address] Phone: [phone]   Fax:  [phone]  Name: [name] MRN: [id number] Date of Birth: 05/09/1946

## 2016-12-02 NOTE — Therapy (Signed)
[facility] [address] [place], [place], [address] Phone: [phone]   Fax:  [phone]  Physical Therapy Treatment  Patient Details  Name: [name] MRN: [id number] Date of Birth: 10/01/1945 Referring Provider: Dr. Krista Blue  Encounter Date: 12/02/2016      PT End of Session - 12/02/16 1107    Visit Number 10   Number of Visits 17   Date for PT Re-Evaluation 12/19/16   Authorization Type G-CODE AND PROGRESS NOTE EVERY 10TH VISIT. Medicare and BCBS 2nd   PT Start Time 1104   PT Stop Time 1145   PT Time Calculation (min) 41 min   Equipment Utilized During Treatment Gait belt   Activity Tolerance Patient tolerated treatment well   Behavior During Therapy WFL for tasks assessed/performed      Past Medical History:  Diagnosis Date  . Acute blood loss anemia   . CHF (congestive heart failure) (HCC)    EF 48-01%, grade 1 diastolic dysfunction per echo 04/2015  . 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 CVA (cerebral infarction) 04/2015   Started Plavix 04/2015  . Depression   . Diabetes ([place]) 2016   Type II. On insulin  . Enchondroma of bone 2011   left femur.   . Fracture, intertrochanteric, right femur ([place])   . History of lower GI bleeding   . Hyperlipidemia   . Hypertension   . Patent foramen ovale 04/2015   Started on warfarin 04/2015  . Protein calorie malnutrition ([place])   . Stercoral ulcer of rectum 05/16/2015    Past Surgical History:  Procedure Laterality Date  . CARDIAC SURGERY     Cath without stent  . COLONOSCOPY N/A 05/15/2015   Procedure: COLONOSCOPY;  Surgeon: Mauri Pole, MD;  Location: [facility] ENDOSCOPY;  Service: Endoscopy;  Laterality: N/A;  . FLEXIBLE SIGMOIDOSCOPY N/A 05/15/2015   Procedure: FLEXIBLE SIGMOIDOSCOPY;  Surgeon: Mauri Pole, MD;  Location: [facility] ENDOSCOPY;  Service: Endoscopy;  Laterality: N/A;  at bedside   . INTRAMEDULLARY (IM) NAIL INTERTROCHANTERIC Right 06/12/2015   Procedure: INTRAMEDULLARY (IM) NAIL RIGHT HIP;  Surgeon: Renette Butters, MD;  Location: [facility];  Service: Orthopedics;  Laterality: Right;  . TEE WITHOUT CARDIOVERSION N/A 05/05/2015   Procedure: TRANSESOPHAGEAL ECHOCARDIOGRAM (TEE);  Surgeon: Dixie Dials, MD;  [facility] ENDOSCOPY;  Service: Cardiovascular;  Laterality: N/A;    There were no vitals filed for this visit.      Subjective Assessment - 12/02/16 1105    Subjective No new complaints. No falls. Pt reports right UE pain (OT addressing)   Patient is accompained by: Family member   Pertinent History HTN, DM, CHF, CVA, hx of GI bleeds, acute confusional state, aphasia, chronic R shoulder pain (and L shoulder pain starting), anemia, HLD, patent foramen ovale, hx of fall and R hip fracture in 2016   Patient Stated Goals Walk and txf IND or with minor assistance   Currently in Pain? Yes   Pain Score --  with movement, 0/10 at rest   Pain Location Shoulder   Pain Orientation Right   Pain Descriptors / Indicators Aching;Sore   Pain Type Chronic pain   Pain Onset More than a month ago   Pain Frequency Constant   Aggravating Factors  malpositioning, tone, movements of any kind   Pain Relieving Factors keeping it still, good positioning            [facility] Adult PT  Treatment/Exercise - 12/02/16 1122      Transfers   Transfers Sit to Stand;Stand to Sit   Sit to Stand 3: Mod assist;With upper extremity assist;From chair/3-in-1;With armrests   Sit to Stand Details Verbal cues for technique;Verbal cues for sequencing;Verbal cues for precautions/safety;Manual facilitation for weight shifting;Manual facilitation for placement;Manual facilitation for weight bearing   Stand to Sit 3: Mod assist;With upper extremity assist;To chair/3-in-1;With armrests;Uncontrolled descent   Stand to Sit Details (indicate cue type and reason) Verbal cues for sequencing;Verbal cues for  technique;Verbal cues for safe use of DME/AE;Manual facilitation for placement;Manual facilitation for weight bearing;Manual facilitation for weight shifting     Ambulation/Gait   Ambulation/Gait Yes   Ambulation/Gait Assistance 4: Min assist;3: Mod assist   Ambulation/Gait Assistance Details mod assist of 2 downgrading to min assist of 2 people: arms crossed behind pt's back with left UE HHA for balance. total assist intitally to advance right LE, progressing to max assist with pt showing initiation of knee flexion needing assist to continue advancement of right foot forward with simulated toe cap. pt also needs max cues on posture, and lateral weight shifting with gait as well.                           Ambulation Distance (Feet) 10 Feet  x1, 25 x1   Assistive device 2 person hand held assist   Gait Pattern Step-to pattern;Decreased stride length;Decreased step length - right;Decreased step length - left;Decreased arm swing - right;Decreased arm swing - left;Decreased hip/knee flexion - right;Decreased dorsiflexion - right;Decreased weight shift to left;Decreased weight shift to right;Ataxic;Decreased trunk rotation;Trunk flexed;Narrow base of support;Poor foot clearance - right   Ambulation Surface Level;Indoor     Knee/Hip Exercises: Stretches   Passive Hamstring Stretch Both;2 reps;60 seconds;Limitations   Passive Hamstring Stretch Limitations performed with pt seated in wheelchair   Quad Stretch Right;Limitations   Quad Stretch Limitations seated in wheelchair: passive knee flexion for quad stretch (foot on pillowcase on slide board to allow for easier movements)            PT Short Term Goals - 11/16/16 1622      PT SHORT TERM GOAL #1   Title Pt will be perform HEP with assist from son to improve balance, flexibility and strength. TARGET DATE FOR ALL STGS: 11/17/16   Status Partially Met     PT SHORT TERM GOAL #2   Title Pt will perform STS txfs with mod A in order to improve  functional mobility and decr. caregiver burdern.   Status Partially Met     PT SHORT TERM GOAL #3   Title Attempt amb. as tolerated and appropriate.   Status Achieved           PT Long Term Goals - 11/16/16 1622      PT LONG TERM GOAL #1   Title Pt and pt's son will verbalize CVA risk factors and s/s in order to reduce risk of additional CVA. TARGET DATE FOR ALL LTGS: 12/15/16   Status New     PT LONG TERM GOAL #2   Title Pt will perform STS txfs with min A to decr. caregiver burden and improve functional mobility.    Status New     PT LONG TERM GOAL #3   Title Pt will be able to stand for 5 minutes while perform dynamic balance activities with min A in order to perform ADLs safely, with caregiver.  Status New     PT LONG TERM GOAL #4   Title Pt will improve SIS: mobility score to >/=37.2 to improve quality of life.    Status New     PT LONG TERM GOAL #5   Title Pt will amb. 41' with LRAD and 1 person mod A to improve functional mobility.    Status New           Plan - 12-08-2016 1108    Clinical Impression Statement Today's skilled session focused on LE stretching and gait. Pt continues to need increased time with all mobility. Pt is making slow, steady progress toward goals. Pt should benefit from continued PT to progress toward unmet goals    Clinical Impairments Affecting Rehab Potential CVA was in Aug 17, 2014 and it is unclear how much pt was actually ambulating with HHPT.   PT Treatment/Interventions ADLs/Self Care Home Management;Biofeedback;Neuromuscular re-education;Balance training;Therapeutic exercise;Therapeutic activities;Functional mobility training;Gait training;Patient/family education;Orthotic Fit/Training;Vestibular   PT Next Visit Plan  gait with simulated toe cap/2 person assistance; continue to work on strengthening, flexibility and standing balance/posture   Consulted and Agree with Plan of Care Patient   Family Member Consulted son-Terry      Patient  will benefit from skilled therapeutic intervention in order to improve the following deficits and impairments:  Decreased endurance, Pain, Impaired tone, Decreased balance, Decreased mobility, Difficulty walking, Impaired flexibility, Postural dysfunction, Decreased coordination, Decreased knowledge of use of DME, Decreased strength, Impaired UE functional use  Visit Diagnosis: Spastic hemiplegia of right dominant side as late effect of other cerebrovascular disease ([place])  Abnormal posture  Unsteadiness on feet  Other abnormalities of gait and mobility      G-Codes - December 08, 2016 08-17-45    Functional Assessment Tool Used (Outpatient Only) SIS (proxy version) 40% impaired   Functional Limitation Self care   Self Care Current Status (V4008) At least 80 percent but less than 100 percent impaired, limited or restricted   Self Care Goal Status (Q7619) At least 60 percent but less than 80 percent impaired, limited or restricted      Problem List Patient Active Problem List   Diagnosis Date Noted  . Muscle spasticity 01/20/2016  . Bilateral lower extremity edema 12/23/2015  . Acute confusional state 10/28/2015  . Right spastic hemiparesis ([place]) 10/07/2015  . Right shoulder pain 07/24/2015  . Fracture, intertrochanteric, right femur ([place]) 06/10/2015  . Stercoral ulcer of rectum 05/16/2015  . GI bleeding 05/15/2015  . GI bleed 05/13/2015  . BRBPR (bright red blood per rectum) 05/13/2015  . CHF (congestive heart failure) ([place])   . Diabetes ([place])   . Hypertension   . History of stroke   . Lower GI bleed   . Left-sided cerebrovascular accident (CVA) ([place]) 04/28/2015    Willow Ora, PTA, [facility] [address] [place], [place] [address] [phone] 2016/12/08, 9:58 PM   Name: [name] MRN: [id number] Date of Birth: 06-Dec-1945  Physical Therapy Progress Note  Dates of Reporting Period: 10/20/16 to December 08, 2016  Objective Reports of Subjective  Statement: Pt able to amb. Longer distances today.  Objective Measurements: SIS-see above  Goal Update:      PT Short Term Goals - 11/16/16 1622      PT SHORT TERM GOAL #1   Title Pt will be perform HEP with assist from son to improve balance, flexibility and strength. TARGET DATE FOR ALL STGS: 11/17/16   Status Partially Met     PT SHORT TERM GOAL #  2   Title Pt will perform STS txfs with mod A in order to improve functional mobility and decr. caregiver burdern.   Status Partially Met     PT SHORT TERM GOAL #3   Title Attempt amb. as tolerated and appropriate.   Status Achieved       Plan: To continue gait training, strengthening and flexibility activities, balance training.  Reason Skilled Services are Required: To improve safety during functional mobility.   G-code and progress note completed by PT.  Geoffry Paradise, PT,DPT 12/03/16 12:37 PM Phone: [phone] Fax: [phone]

## 2016-12-03 ENCOUNTER — Other Ambulatory Visit: Payer: Self-pay | Admitting: Neurology

## 2016-12-06 ENCOUNTER — Encounter: Payer: Self-pay | Admitting: Occupational Therapy

## 2016-12-06 ENCOUNTER — Ambulatory Visit: Payer: Medicare Other | Attending: Neurology | Admitting: Occupational Therapy

## 2016-12-06 ENCOUNTER — Ambulatory Visit: Payer: Medicare Other | Admitting: Speech Pathology

## 2016-12-06 DIAGNOSIS — R482 Apraxia: Secondary | ICD-10-CM | POA: Diagnosis not present

## 2016-12-06 DIAGNOSIS — I69351 Hemiplegia and hemiparesis following cerebral infarction affecting right dominant side: Secondary | ICD-10-CM | POA: Diagnosis not present

## 2016-12-06 DIAGNOSIS — R2689 Other abnormalities of gait and mobility: Secondary | ICD-10-CM | POA: Diagnosis not present

## 2016-12-06 DIAGNOSIS — I69851 Hemiplegia and hemiparesis following other cerebrovascular disease affecting right dominant side: Secondary | ICD-10-CM | POA: Diagnosis not present

## 2016-12-06 DIAGNOSIS — R293 Abnormal posture: Secondary | ICD-10-CM | POA: Diagnosis not present

## 2016-12-06 DIAGNOSIS — R4701 Aphasia: Secondary | ICD-10-CM | POA: Diagnosis not present

## 2016-12-06 DIAGNOSIS — R41844 Frontal lobe and executive function deficit: Secondary | ICD-10-CM | POA: Insufficient documentation

## 2016-12-06 DIAGNOSIS — M79601 Pain in right arm: Secondary | ICD-10-CM

## 2016-12-06 DIAGNOSIS — M79641 Pain in right hand: Secondary | ICD-10-CM | POA: Diagnosis not present

## 2016-12-06 DIAGNOSIS — R2681 Unsteadiness on feet: Secondary | ICD-10-CM | POA: Diagnosis not present

## 2016-12-06 NOTE — Patient Instructions (Signed)
  The [place] is Right  Let's Make a [place] and [facility] [place]  Bold and Beautiful  Dry Clean this please  More please  Pass the chicken  Take me to Peggy's house  I want to see Brayton Layman and Donnalee Curry

## 2016-12-06 NOTE — Therapy (Signed)
[facility] [address] [place], [place], [address] Phone: [phone]   Fax:  [phone]  Occupational Therapy Treatment  Patient Details  Name: [name] MRN: [id number] Date of Birth: Nov 22, 1945 Referring Provider: Dr.Yan  Encounter Date: 12/06/2016      OT End of Session - 12/06/16 1252    Visit Number 6   Number of Visits 16   Date for OT Re-Evaluation 01/25/17   Authorization Type medicare, BCBS as secondary.  Pt will need G code and PN every 10th visit   Authorization Time Period 60 days   Authorization - Visit Number 6   Authorization - Number of Visits 10   OT Start Time 6294   OT Stop Time 1231   OT Time Calculation (min) 43 min   Activity Tolerance Patient tolerated treatment well      Past Medical History:  Diagnosis Date  . Acute blood loss anemia   . CHF (congestive heart failure) (HCC)    EF 76-54%, grade 1 diastolic dysfunction per echo 04/2015  . 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 CVA (cerebral infarction) 04/2015   Started Plavix 04/2015  . Depression   . Diabetes ([place]) 2016   Type II. On insulin  . Enchondroma of bone 2011   left femur.   . Fracture, intertrochanteric, right femur ([place])   . History of lower GI bleeding   . Hyperlipidemia   . Hypertension   . Patent foramen ovale 04/2015   Started on warfarin 04/2015  . Protein calorie malnutrition ([place])   . Stercoral ulcer of rectum 05/16/2015    Past Surgical History:  Procedure Laterality Date  . CARDIAC SURGERY     Cath without stent  . COLONOSCOPY N/A 05/15/2015   Procedure: COLONOSCOPY;  Surgeon: Mauri Pole, MD;  Location: [facility] ENDOSCOPY;  Service: Endoscopy;  Laterality: N/A;  . FLEXIBLE SIGMOIDOSCOPY N/A 05/15/2015   Procedure: FLEXIBLE SIGMOIDOSCOPY;  Surgeon: Mauri Pole, MD;  Location: [facility] ENDOSCOPY;  Service: Endoscopy;  Laterality: N/A;   at bedside  . INTRAMEDULLARY (IM) NAIL INTERTROCHANTERIC Right 06/12/2015   Procedure: INTRAMEDULLARY (IM) NAIL RIGHT HIP;  Surgeon: Renette Butters, MD;  Location: [facility];  Service: Orthopedics;  Laterality: Right;  . TEE WITHOUT CARDIOVERSION N/A 05/05/2015   Procedure: TRANSESOPHAGEAL ECHOCARDIOGRAM (TEE);  Surgeon: Dixie Dials, MD;  Location: [facility] ENDOSCOPY;  Service: Cardiovascular;  Laterality: N/A;    There were no vitals filed for this visit.      Subjective Assessment - 12/06/16 1156    Subjective  No I can't (when asked to stand up)   Patient is accompained by: Family member  son   Pertinent History see epic pt s/p L CVA   Patient Stated Goals pt unable to state due to aphasia can answer yes and no   Currently in Pain? Yes   Pain Score --  6 with movement   Pain Location Shoulder   Pain Orientation Right   Pain Descriptors / Indicators Aching;Sore   Pain Type Chronic pain   Pain Onset More than a month ago   Pain Frequency Constant   Aggravating Factors  movement, malpositioning, tone,   Pain Relieving Factors keeping it still, good positioning                      OT Treatments/Exercises (OP) - 12/06/16 0001      Neurological Re-education Exercises  Other Exercises 1 Neuro re ed to address scooting, sit to stand, standing balance, stand step transfers. Pt needs max encouragement, max cues and mod facilitation for forward leaning and to push with LE's and LUE into standing. Pt's son reports that pt is sitting in wheelchiar more during the day and is tolerating splint at night.      Manual Therapy   Manual Therapy Joint mobilization;Soft tissue mobilization;Scapular mobilization   Manual therapy comments Joint, soft tissue, scap mob to address RUE shoulder tightenss, malalignment - pt with severe inferior, anterior subluxation with signifcant IR./abduction.  Pt with slow improvement.                   OT Short Term Goals - 12/06/16 1251       OT SHORT TERM GOAL #1   Title Pt and family will be mod I with HEP- 12/28/2016 (pt has only attended OT for 4 visits since eval therefore STG's will assume LTG goal date of 1/61/0960 and will recertify today   Status On-going     OT SHORT TERM GOAL #2   Title Pt will report pain in RUE no greater than 8/10 with PROM home program    Status On-going     OT SHORT TERM GOAL #3   Title Pt will require mod a for sit to stand/ stand to sit for toilet transfers and LB self care.    Status On-going     OT SHORT TERM GOAL #4   Title Pt will require no more than supervision for static standing balance with 1 UE support for pre ADL function.     Status On-going           OT Long Term Goals - 12/06/16 1251      OT LONG TERM GOAL #1   Title Pt and family will be mod I with upgraded HEP prn- 01/25/2017   Status On-going     OT LONG TERM GOAL #2   Title Pt will rate pain in RUE no greater than 4/10 with home program and with movement to arm during ADL activity   Status On-going     OT LONG TERM GOAL #3   Title Pt will be min a for toilet transfers.   Status On-going     OT LONG TERM GOAL #4   Title Pt will be able to maintain standing balance during simple ADL activity at sink without UE support and with supervision   Status On-going               Plan - 12/06/16 1251    Clinical Impression Statement Pt with slow progress toward goals. Pt's RUE with slowly improving alignment and less pain at rest.     Rehab Potential Fair   Current Impairments/barriers affecting progress: given severity of deficits as well as number of deficits, length since onset of stroke, pain   OT Frequency 2x / week   OT Duration 8 weeks   OT Treatment/Interventions Self-care/ADL training;Electrical Stimulation;Moist Heat;Ultrasound;Therapeutic exercise;Neuromuscular education;DME and/or AE instruction;Passive range of motion;Manual Therapy;Functional Mobility Training;Splinting;Therapeutic  activities;Visual/perceptual remediation/compensation;Patient/family education;Balance training   Plan NMR for RUE, trunk, functional mobility, postural orientation, balance, alignment, manual therapy   Consulted and Agree with Plan of Care Patient;Family member/caregiver   Family Member Consulted son      Patient will benefit from skilled therapeutic intervention in order to improve the following deficits and impairments:  Abnormal gait, Decreased activity tolerance, Decreased balance, Decreased cognition, Decreased range of  motion, Decreased mobility, Decreased knowledge of use of DME, Difficulty walking, Impaired UE functional use, Impaired tone, Pain  Visit Diagnosis: Spastic hemiplegia of right dominant side as late effect of other cerebrovascular disease (HCC)  Abnormal posture  Unsteadiness on feet  Pain in right arm  Pain in right hand  Frontal lobe and executive function deficit  Apraxia    Problem List Patient Active Problem List   Diagnosis Date Noted  . Muscle spasticity 01/20/2016  . Bilateral lower extremity edema 12/23/2015  . Acute confusional state 10/28/2015  . Right spastic hemiparesis ([place]) 10/07/2015  . Right shoulder pain 07/24/2015  . Fracture, intertrochanteric, right femur ([place]) 06/10/2015  . Stercoral ulcer of rectum 05/16/2015  . GI bleeding 05/15/2015  . GI bleed 05/13/2015  . BRBPR (bright red blood per rectum) 05/13/2015  . CHF (congestive heart failure) ([place])   . Diabetes ([place])   . Hypertension   . History of stroke   . Lower GI bleed   . Left-sided cerebrovascular accident (CVA) ([place]) 04/28/2015    Sherryll Burger 12/06/2016, 12:53 PM  [facility] [address] [place], [place], [address] Phone: [phone]   Fax:  [phone]  Name: HALI BALGOBIN MRN: [id number] Date of Birth: 1945-12-28

## 2016-12-06 NOTE — Therapy (Signed)
[facility] [address] [place], [place], [address] Phone: [phone]   Fax:  [phone]  Speech Language Pathology Treatment  Patient Details  Name: [name] MRN: [id number] Date of Birth: Oct 23, 1945 Referring Provider: Marcial Pacas, MD  Encounter Date: 12/06/2016      End of Session - 12/06/16 1210    Visit Number 9   Number of Visits 17   Date for SLP Re-Evaluation 12/24/16   SLP Start Time 1103   SLP Stop Time  1145   SLP Time Calculation (min) 42 min   Activity Tolerance Patient tolerated treatment well      Past Medical History:  Diagnosis Date  . Acute blood loss anemia   . CHF (congestive heart failure) (HCC)    EF 96-75%, grade 1 diastolic dysfunction per echo 04/2015  . 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 CVA (cerebral infarction) 04/2015   Started Plavix 04/2015  . Depression   . Diabetes ([place]) 2016   Type II. On insulin  . Enchondroma of bone 2011   left femur.   . Fracture, intertrochanteric, right femur ([place])   . History of lower GI bleeding   . Hyperlipidemia   . Hypertension   . Patent foramen ovale 04/2015   Started on warfarin 04/2015  . Protein calorie malnutrition ([place])   . Stercoral ulcer of rectum 05/16/2015    Past Surgical History:  Procedure Laterality Date  . CARDIAC SURGERY     Cath without stent  . COLONOSCOPY N/A 05/15/2015   Procedure: COLONOSCOPY;  Surgeon: Mauri Pole, MD;  Location: [facility] ENDOSCOPY;  Service: Endoscopy;  Laterality: N/A;  . FLEXIBLE SIGMOIDOSCOPY N/A 05/15/2015   Procedure: FLEXIBLE SIGMOIDOSCOPY;  Surgeon: Mauri Pole, MD;  Location: [facility] ENDOSCOPY;  Service: Endoscopy;  Laterality: N/A;  at bedside  . INTRAMEDULLARY (IM) NAIL INTERTROCHANTERIC Right 06/12/2015   Procedure: INTRAMEDULLARY (IM) NAIL RIGHT HIP;  Surgeon: Renette Butters, MD;  Location: [facility];  Service:  Orthopedics;  Laterality: Right;  . TEE WITHOUT CARDIOVERSION N/A 05/05/2015   Procedure: TRANSESOPHAGEAL ECHOCARDIOGRAM (TEE);  Surgeon: Dixie Dials, MD;  Location: [facility] ENDOSCOPY;  Service: Cardiovascular;  Laterality: N/A;    There were no vitals filed for this visit.      Subjective Assessment - 12/06/16 1108    Subjective UhUhUh OK               ADULT SLP TREATMENT - 12/06/16 1108      General Information   Behavior/Cognition Alert;Cooperative;Pleasant mood     Treatment Provided   Treatment provided Cognitive-Linquistic     Pain Assessment   Pain Assessment No/denies pain     Cognitive-Linquistic Treatment   Treatment focused on Aphasia;Apraxia;Patient/family/caregiver education   Skilled Treatment Initiated therapy with rote speech - count 1-20 with occasional min A, months with 1 cue for initial syllable of November, Repetition of multisyllabic words and phrases with initial sound dysfluency and occasional to usual min A. Divergent naming 5 items for category (animlas, jewels, flowers/trees) with occasional to usual semantic, phonemic and written cues. Pt verbalized time from the clock and verbalized how many minutes were left in session with rare min A.      Assessment / Recommendations / Plan   Plan Continue with current plan of care     Progression Toward Goals   Progression toward goals Progressing toward goals  SLP Education - 12/06/16 1156    Education provided Yes   Education Details cueing for naming and speech tasks   Person(s) Educated Patient;Child(ren)   Methods Explanation;Demonstration;Verbal cues   Comprehension Verbalized understanding;Need further instruction            SLP Long Term Goals - 12/06/16 1210      SLP LONG TERM GOAL #1   Title pt will recite rote speech tasks using speech compensations for fluent speech 70% success   Status Achieved     SLP LONG TERM GOAL #2   Title pt will answer wh ?s with multimodal  communication PRN, functionally, with rare min A   Time 4   Period Weeks   Status Not Met  and ongoing     SLP LONG TERM GOAL #3   Title pt will communicate wants and needs functionally using speech compensations    Time 4   Period Weeks   Status Not Met  and ongoing     SLP LONG TERM GOAL #4   Title pt will respond to family's appropriate cues for fluent speech by improved fluency 50% of the time   Time 4   Period Weeks   Status Partially Met  and ongoing     SLP LONG TERM GOAL #5   Title pt will perform apraxia/speech HEP with 60% fluent speech with rare min A   Status Deferred  due to functional practice instead          Plan - 12/06/16 1157    Clinical Impression Statement Pt answered simple questions re: family names, shows she watches and food she likes with questioning cues from her son and mod A from [place]. Generated list of phrases of shows, people, places pt likes. Son reports pt indicates what she wants to wear, when she needs her hair or nails done, what she wants to eat at home with gestures and yes/no responses. Dysfluencies prevalent in spontaneous 1-2 word utterances and divergent naming tasks. Continue skilled ST to maximize communication to reduce caregiver burden.   Speech Therapy Frequency 2x / week   Duration 4 weeks   Treatment/Interventions Functional tasks;Patient/family education;Compensatory strategies;Oral motor exercises;Internal/external aids;SLP instruction and feedback;Multimodal communcation approach   Potential to Achieve Goals Fair   Potential Considerations Severity of impairments;Cooperation/participation level   Consulted and Agree with Plan of Care Patient;Family member/caregiver      Patient will benefit from skilled therapeutic intervention in order to improve the following deficits and impairments:   Aphasia  Apraxia    Problem List Patient Active Problem List   Diagnosis Date Noted  . Muscle spasticity 01/20/2016  . Bilateral  lower extremity edema 12/23/2015  . Acute confusional state 10/28/2015  . Right spastic hemiparesis ([place]) 10/07/2015  . Right shoulder pain 07/24/2015  . Fracture, intertrochanteric, right femur ([place]) 06/10/2015  . Stercoral ulcer of rectum 05/16/2015  . GI bleeding 05/15/2015  . GI bleed 05/13/2015  . BRBPR (bright red blood per rectum) 05/13/2015  . CHF (congestive heart failure) ([place])   . Diabetes ([place])   . Hypertension   . History of stroke   . Lower GI bleed   . Left-sided cerebrovascular accident (CVA) ([place]) 04/28/2015    [name], Melissa Rusk  MS, CCC-SLP 12/06/2016, 12:11 PM  [facility] [address] [place], [place], [address] Phone: [phone]   Fax:  [phone]   Name: [name] MRN: [id number] Date of Birth: 1945-07-22

## 2016-12-07 ENCOUNTER — Ambulatory Visit: Payer: Medicare Other

## 2016-12-07 DIAGNOSIS — R41844 Frontal lobe and executive function deficit: Secondary | ICD-10-CM | POA: Diagnosis not present

## 2016-12-07 DIAGNOSIS — M79641 Pain in right hand: Secondary | ICD-10-CM | POA: Diagnosis not present

## 2016-12-07 DIAGNOSIS — R293 Abnormal posture: Secondary | ICD-10-CM | POA: Diagnosis not present

## 2016-12-07 DIAGNOSIS — R2681 Unsteadiness on feet: Secondary | ICD-10-CM | POA: Diagnosis not present

## 2016-12-07 DIAGNOSIS — I69851 Hemiplegia and hemiparesis following other cerebrovascular disease affecting right dominant side: Secondary | ICD-10-CM | POA: Diagnosis not present

## 2016-12-07 DIAGNOSIS — M79601 Pain in right arm: Secondary | ICD-10-CM | POA: Diagnosis not present

## 2016-12-07 DIAGNOSIS — I69351 Hemiplegia and hemiparesis following cerebral infarction affecting right dominant side: Secondary | ICD-10-CM

## 2016-12-07 DIAGNOSIS — R2689 Other abnormalities of gait and mobility: Secondary | ICD-10-CM

## 2016-12-07 NOTE — Therapy (Signed)
[facility] [address] [place], [place], [address] Phone: [phone]   Fax:  [phone]  Physical Therapy Treatment  Patient Details  Name: [name] MRN: [id number] Date of Birth: [birth year] Referring Provider: Dr. Krista Blue  Encounter Date: 12/07/2016      PT End of Session - 12/07/16 1445    Visit Number 11   Number of Visits 17   Date for PT Re-Evaluation 12/19/16   Authorization Type G-CODE AND PROGRESS NOTE EVERY 10TH VISIT. Medicare and BCBS 2nd   PT Start Time 1401   PT Stop Time 1443   PT Time Calculation (min) 42 min   Equipment Utilized During Treatment Gait belt   Activity Tolerance Patient tolerated treatment well   Behavior During Therapy WFL for tasks assessed/performed      Past Medical History:  Diagnosis Date  . Acute blood loss anemia   . CHF (congestive heart failure) (HCC)    EF 99-83%, grade 1 diastolic dysfunction per echo 04/2015  . 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 CVA (cerebral infarction) 04/2015   Started Plavix 04/2015  . Depression   . Diabetes ([place]) 2016   Type II. On insulin  . Enchondroma of bone 2011   left femur.   . Fracture, intertrochanteric, right femur ([place])   . History of lower GI bleeding   . Hyperlipidemia   . Hypertension   . Patent foramen ovale 04/2015   Started on warfarin 04/2015  . Protein calorie malnutrition ([place])   . Stercoral ulcer of rectum 05/16/2015    Past Surgical History:  Procedure Laterality Date  . CARDIAC SURGERY     Cath without stent  . COLONOSCOPY N/A 05/15/2015   Procedure: COLONOSCOPY;  Surgeon: Mauri Pole, MD;  Location: [facility] ENDOSCOPY;  Service: Endoscopy;  Laterality: N/A;  . FLEXIBLE SIGMOIDOSCOPY N/A 05/15/2015   Procedure: FLEXIBLE SIGMOIDOSCOPY;  Surgeon: Mauri Pole, MD;  Location: [facility] ENDOSCOPY;  Service: Endoscopy;  Laterality: N/A;  at bedside   . INTRAMEDULLARY (IM) NAIL INTERTROCHANTERIC Right 06/12/2015   Procedure: INTRAMEDULLARY (IM) NAIL RIGHT HIP;  Surgeon: Renette Butters, MD;  Location: [facility];  Service: Orthopedics;  Laterality: Right;  . TEE WITHOUT CARDIOVERSION N/A 05/05/2015   Procedure: TRANSESOPHAGEAL ECHOCARDIOGRAM (TEE);  Surgeon: Dixie Dials, MD;  Location: [facility] ENDOSCOPY;  Service: Cardiovascular;  Laterality: N/A;    There were no vitals filed for this visit.      Subjective Assessment - 12/07/16 1406    Subjective Pt denied falls or changes since last visit.    Patient is accompained by: --  son parking car   Pertinent History HTN, DM, CHF, CVA, hx of GI bleeds, acute confusional state, aphasia, chronic R shoulder pain (and L shoulder pain starting), anemia, HLD, patent foramen ovale, hx of fall and R hip fracture in 2016   Patient Stated Goals Walk and txf IND or with minor assistance   Currently in Pain? No/denies                         [facility] Adult PT Treatment/Exercise - 12/07/16 1407      Ambulation/Gait   Ambulation/Gait Yes   Ambulation/Gait Assistance 3: Mod assist   Ambulation/Gait Assistance Details 2 person assist with and without hemiwalker, with arms crossed behind pt's back. Cues/facilitation for lateral weight shifting, sequencing with hemiwalker, upright posture. Simulated toe cap donned during gait. Pt  with attempts to advance RLE, but required assist to advance RLE 2/2 weakness. Pt was able to scoot forward to edge of w/c with less assist today and participated in ant. Weight shifting during STS txfs.    Ambulation Distance (Feet) 10 Feet  7', 5'   Assistive device 2 person hand held assist;Hemi-walker   Gait Pattern Step-to pattern;Decreased stride length;Decreased step length - right;Decreased step length - left;Decreased arm swing - right;Decreased arm swing - left;Decreased hip/knee flexion - right;Decreased dorsiflexion - right;Decreased weight shift to left;Decreased  weight shift to right;Ataxic;Decreased trunk rotation;Trunk flexed;Narrow base of support;Poor foot clearance - right   Ambulation Surface Level;Indoor                PT Education - 12/07/16 1444    Education provided Yes   Education Details PT asked pt's son to practice lat/post/ant weight shifting in standing with pt prior to next session and to activate glute max to improve upright posture.    Person(s) Educated Patient;Child(ren)   Methods Explanation;Demonstration;Verbal cues   Comprehension Returned demonstration;Verbalized understanding;Need further instruction          PT Short Term Goals - 11/16/16 1622      PT SHORT TERM GOAL #1   Title Pt will be perform HEP with assist from son to improve balance, flexibility and strength. TARGET DATE FOR ALL STGS: 11/17/16   Status Partially Met     PT SHORT TERM GOAL #2   Title Pt will perform STS txfs with mod A in order to improve functional mobility and decr. caregiver burdern.   Status Partially Met     PT SHORT TERM GOAL #3   Title Attempt amb. as tolerated and appropriate.   Status Achieved           PT Long Term Goals - 11/16/16 1622      PT LONG TERM GOAL #1   Title Pt and pt's son will verbalize CVA risk factors and s/s in order to reduce risk of additional CVA. TARGET DATE FOR ALL LTGS: 12/15/16   Status New     PT LONG TERM GOAL #2   Title Pt will perform STS txfs with min A to decr. caregiver burden and improve functional mobility.    Status New     PT LONG TERM GOAL #3   Title Pt will be able to stand for 5 minutes while perform dynamic balance activities with min A in order to perform ADLs safely, with caregiver.    Status New     PT LONG TERM GOAL #4   Title Pt will improve SIS: mobility score to >/=37.2 to improve quality of life.    Status New     PT LONG TERM GOAL #5   Title Pt will amb. 77' with LRAD and 1 person mod A to improve functional mobility.    Status New                Plan - 12/07/16 1445    Clinical Impression Statement Pt demonstrated progress, as she was able to improve weight shifting while using hemi walker during amb. Pt also amb. without PT performing RLE stretches prior to gait. Pt required seated rest breaks after amb. 2/2 fatigue and L hip cramping. Continue with POC.    Clinical Impairments Affecting Rehab Potential CVA was in 2016 and it is unclear how much pt was actually ambulating with HHPT.   PT Treatment/Interventions ADLs/Self Care Home Management;Biofeedback;Neuromuscular re-education;Balance training;Therapeutic exercise;Therapeutic activities;Functional mobility  training;Gait training;Patient/family education;Orthotic Fit/Training;Vestibular   PT Next Visit Plan  gait with simulated toe cap/2 person assistance; continue to work on strengthening, flexibility and standing balance/posture   Consulted and Agree with Plan of Care Patient   Family Member Consulted son-Terry      Patient will benefit from skilled therapeutic intervention in order to improve the following deficits and impairments:  Decreased endurance, Pain, Impaired tone, Decreased balance, Decreased mobility, Difficulty walking, Impaired flexibility, Postural dysfunction, Decreased coordination, Decreased knowledge of use of DME, Decreased strength, Impaired UE functional use  Visit Diagnosis: Other abnormalities of gait and mobility  Hemiplegia and hemiparesis following cerebral infarction affecting right dominant side [facility])     Problem List Patient Active Problem List   Diagnosis Date Noted  . Muscle spasticity 01/20/2016  . Bilateral lower extremity edema 12/23/2015  . Acute confusional state 10/28/2015  . Right spastic hemiparesis ([place]) 10/07/2015  . Right shoulder pain 07/24/2015  . Fracture, intertrochanteric, right femur ([place]) 06/10/2015  . Stercoral ulcer of rectum 05/16/2015  . GI bleeding 05/15/2015  . GI bleed 05/13/2015  . BRBPR (bright red blood per  rectum) 05/13/2015  . CHF (congestive heart failure) ([place])   . Diabetes ([place])   . Hypertension   . History of stroke   . Lower GI bleed   . Left-sided cerebrovascular accident (CVA) ([place]) 04/28/2015    Bretta Fees L 12/07/2016, 2:48 PM  [facility] [address] [place], [place], [address] Phone: [phone]   Fax:  [phone]  Name: HALINA ASANO MRN: [id number] Date of Birth: April 19, 1946  Geoffry Paradise, PT,DPT 12/07/16 2:49 PM Phone: [phone] Fax: [phone]

## 2016-12-09 ENCOUNTER — Encounter: Payer: Self-pay | Admitting: Occupational Therapy

## 2016-12-09 ENCOUNTER — Ambulatory Visit: Payer: Medicare Other | Admitting: Speech Pathology

## 2016-12-09 ENCOUNTER — Encounter: Payer: Self-pay | Admitting: Physical Therapy

## 2016-12-09 ENCOUNTER — Ambulatory Visit: Payer: Medicare Other | Admitting: Physical Therapy

## 2016-12-09 ENCOUNTER — Ambulatory Visit: Payer: Medicare Other | Admitting: Occupational Therapy

## 2016-12-09 DIAGNOSIS — R293 Abnormal posture: Secondary | ICD-10-CM | POA: Diagnosis not present

## 2016-12-09 DIAGNOSIS — R41844 Frontal lobe and executive function deficit: Secondary | ICD-10-CM | POA: Diagnosis not present

## 2016-12-09 DIAGNOSIS — R4701 Aphasia: Secondary | ICD-10-CM

## 2016-12-09 DIAGNOSIS — M79601 Pain in right arm: Secondary | ICD-10-CM | POA: Diagnosis not present

## 2016-12-09 DIAGNOSIS — M79641 Pain in right hand: Secondary | ICD-10-CM

## 2016-12-09 DIAGNOSIS — R2681 Unsteadiness on feet: Secondary | ICD-10-CM | POA: Diagnosis not present

## 2016-12-09 DIAGNOSIS — I69851 Hemiplegia and hemiparesis following other cerebrovascular disease affecting right dominant side: Secondary | ICD-10-CM | POA: Diagnosis not present

## 2016-12-09 DIAGNOSIS — R482 Apraxia: Secondary | ICD-10-CM

## 2016-12-09 DIAGNOSIS — R2689 Other abnormalities of gait and mobility: Secondary | ICD-10-CM

## 2016-12-09 NOTE — Therapy (Signed)
[facility] [address] [place], [place], [address] Phone: [phone]   Fax:  [phone]  Occupational Therapy Treatment  Patient Details  Name: [name] MRN: [id number] Date of Birth: Nov 02, 1945 Referring Provider: Dr.Yan  Encounter Date: 12/09/2016      OT End of Session - 12/09/16 1203    Visit Number 7   Number of Visits 16   Date for OT Re-Evaluation 01/25/17   Authorization Type medicare, BCBS as secondary.  Pt will need G code and PN every 10th visit   Authorization Time Period 60 days   Authorization - Visit Number 7   Authorization - Number of Visits 10   OT Start Time 1018   OT Stop Time 1100   OT Time Calculation (min) 42 min   Activity Tolerance Patient tolerated treatment well      Past Medical History:  Diagnosis Date  . Acute blood loss anemia   . CHF (congestive heart failure) (HCC)    EF 07-62%, grade 1 diastolic dysfunction per echo 04/2015  . 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 CVA (cerebral infarction) 04/2015   Started Plavix 04/2015  . Depression   . Diabetes ([place]) 2016   Type II. On insulin  . Enchondroma of bone 2011   left femur.   . Fracture, intertrochanteric, right femur ([place])   . History of lower GI bleeding   . Hyperlipidemia   . Hypertension   . Patent foramen ovale 04/2015   Started on warfarin 04/2015  . Protein calorie malnutrition ([place])   . Stercoral ulcer of rectum 05/16/2015    Past Surgical History:  Procedure Laterality Date  . CARDIAC SURGERY     Cath without stent  . COLONOSCOPY N/A 05/15/2015   Procedure: COLONOSCOPY;  Surgeon: Mauri Pole, MD;  Location: [facility] ENDOSCOPY;  Service: Endoscopy;  Laterality: N/A;  . FLEXIBLE SIGMOIDOSCOPY N/A 05/15/2015   Procedure: FLEXIBLE SIGMOIDOSCOPY;  Surgeon: Mauri Pole, MD;  Location: [facility] ENDOSCOPY;  Service: Endoscopy;  Laterality: N/A;   at bedside  . INTRAMEDULLARY (IM) NAIL INTERTROCHANTERIC Right 06/12/2015   Procedure: INTRAMEDULLARY (IM) NAIL RIGHT HIP;  Surgeon: Renette Butters, MD;  Location: [facility];  Service: Orthopedics;  Laterality: Right;  . TEE WITHOUT CARDIOVERSION N/A 05/05/2015   Procedure: TRANSESOPHAGEAL ECHOCARDIOGRAM (TEE);  Surgeon: Dixie Dials, MD;  Location[facility] ENDOSCOPY;  Service: Cardiovascular;  Laterality: N/A;    There were no vitals filed for this visit.      Subjective Assessment - 12/09/16 1025    Subjective  Yes When asked if she felt safer with the walker   Patient is accompained by: Family member  son   Pertinent History see epic pt s/p L CVA   Patient Stated Goals pt unable to state due to aphasia can answer yes and no   Currently in Pain? Yes   Pain Score 5    Pain Location Shoulder   Pain Orientation Right   Pain Descriptors / Indicators Aching;Sore   Pain Type Chronic pain   Pain Onset More than a month ago   Pain Frequency Constant   Aggravating Factors  movement   Pain Relieving Factors keeping it still, good positioning                      OT Treatments/Exercises (OP) - 12/09/16 1157      Neurological Re-education Exercises   Other Exercises 1  Neuro re ed to address sit to stand from elevated surface, static standing balance, and functional ambulation with walker and hand orthosis, with emphasis on more active spontaneous use of RUE.  Pt with improved ability to perform sit to stand from elevated surfaces - needs max cues and min a.  Son to purchase cushion for wheelchair that is at least 3.5 inches thick (pt had a cushion but lost it).  Pt also able to stand statically with walker with close supervision to occassional min a.  Pt is able to activate RUE with walker when attempting to advance walker.  Also addressed active stand to sit - pt needs max cues and mod facilitation for controlled descent.                   OT Short Term Goals - 12/09/16  1201      OT SHORT TERM GOAL #1   Title Pt and family will be mod I with HEP- 12/28/2016 (pt has only attended OT for 4 visits since eval therefore STG's will assume LTG goal date of 03/27/3006 and will recertify today   Status On-going     OT SHORT TERM GOAL #2   Title Pt will report pain in RUE no greater than 8/10 with PROM home program    Status On-going     OT SHORT TERM GOAL #3   Title Pt will require mod a for sit to stand/ stand to sit for toilet transfers and LB self care.    Status Achieved  from elevated surface     OT SHORT TERM GOAL #4   Title Pt will require no more than supervision for static standing balance with 1 UE support for pre ADL function.     Status On-going           OT Long Term Goals - 12/09/16 1201      OT LONG TERM GOAL #1   Title Pt and family will be mod I with upgraded HEP prn- 01/25/2017   Status On-going     OT LONG TERM GOAL #2   Title Pt will rate pain in RUE no greater than 4/10 with home program and with movement to arm during ADL activity   Status On-going     OT LONG TERM GOAL #3   Title Pt will be min a for toilet transfers.   Status On-going     OT LONG TERM GOAL #4   Title Pt will be able to maintain standing balance during simple ADL activity at sink without UE support and with supervision   Status On-going               Plan - 12/09/16 1202    Clinical Impression Statement Pt making slow but steady progress toward goals. Pt appears less fearful of movement.    Rehab Potential Fair   Current Impairments/barriers affecting progress: given severity of deficits as well as number of deficits, length since onset of stroke, pain   OT Frequency 2x / week   OT Duration 8 weeks   OT Treatment/Interventions Self-care/ADL training;Electrical Stimulation;Moist Heat;Ultrasound;Therapeutic exercise;Neuromuscular education;DME and/or AE instruction;Passive range of motion;Manual Therapy;Functional Mobility  Training;Splinting;Therapeutic activities;Visual/perceptual remediation/compensation;Patient/family education;Balance training   Plan NMR for RUE, trunk,functional mobility, postural orientation, balance,alignment, manual therapy   Consulted and Agree with Plan of Care Patient;Family member/caregiver   Family Member Consulted son      Patient will benefit from skilled therapeutic intervention in order to improve the following deficits and  impairments:  Abnormal gait, Decreased activity tolerance, Decreased balance, Decreased cognition, Decreased range of motion, Decreased mobility, Decreased knowledge of use of DME, Difficulty walking, Impaired UE functional use, Impaired tone, Pain  Visit Diagnosis: Spastic hemiplegia of right dominant side as late effect of other cerebrovascular disease (HCC)  Abnormal posture  Unsteadiness on feet  Pain in right arm  Pain in right hand  Frontal lobe and executive function deficit    Problem List Patient Active Problem List   Diagnosis Date Noted  . Muscle spasticity 01/20/2016  . Bilateral lower extremity edema 12/23/2015  . Acute confusional state 10/28/2015  . Right spastic hemiparesis ([place]) 10/07/2015  . Right shoulder pain 07/24/2015  . Fracture, intertrochanteric, right femur ([place]) 06/10/2015  . Stercoral ulcer of rectum 05/16/2015  . GI bleeding 05/15/2015  . GI bleed 05/13/2015  . BRBPR (bright red blood per rectum) 05/13/2015  . CHF (congestive heart failure) ([place])   . Diabetes ([place])   . Hypertension   . History of stroke   . Lower GI bleed   . Left-sided cerebrovascular accident (CVA) ([place]) 04/28/2015    Quay Burow, OTR/L 12/09/2016, 12:04 PM  [facility] [address] [place], [place], [address] Phone: [phone]   Fax:  [phone]  Name: [name] MRN: [id number] Date of Birth: 03-May-1946

## 2016-12-09 NOTE — Therapy (Signed)
[facility] [address] [place], [place], [address] Phone: [phone]   Fax:  [phone]  Speech Language Pathology Treatment and Progress Note  Patient Details  Name: [name] MRN: [id number] Date of Birth: [birth year] Referring Provider: Marcial Pacas, MD  Encounter Date: 12/09/2016      End of Session - 12/09/16 1247    Visit Number 10   Number of Visits 17   Date for SLP Re-Evaluation 12/24/16   SLP Start Time 0938   SLP Stop Time  1020   SLP Time Calculation (min) 42 min   Activity Tolerance Patient tolerated treatment well      Past Medical History:  Diagnosis Date  . Acute blood loss anemia   . CHF (congestive heart failure) (HCC)    EF 85-88%, grade 1 diastolic dysfunction per echo 04/2015  . 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 CVA (cerebral infarction) 04/2015   Started Plavix 04/2015  . Depression   . Diabetes ([place]) 2016   Type II. On insulin  . Enchondroma of bone 2011   left femur.   . Fracture, intertrochanteric, right femur ([place])   . History of lower GI bleeding   . Hyperlipidemia   . Hypertension   . Patent foramen ovale 04/2015   Started on warfarin 04/2015  . Protein calorie malnutrition ([place])   . Stercoral ulcer of rectum 05/16/2015    Past Surgical History:  Procedure Laterality Date  . CARDIAC SURGERY     Cath without stent  . COLONOSCOPY N/A 05/15/2015   Procedure: COLONOSCOPY;  Surgeon: Mauri Pole, MD;  Location: [facility] ENDOSCOPY;  Service: Endoscopy;  Laterality: N/A;  . FLEXIBLE SIGMOIDOSCOPY N/A 05/15/2015   Procedure: FLEXIBLE SIGMOIDOSCOPY;  Surgeon: Mauri Pole, MD;  Location: [facility] ENDOSCOPY;  Service: Endoscopy;  Laterality: N/A;  at bedside  . INTRAMEDULLARY (IM) NAIL INTERTROCHANTERIC Right 06/12/2015   Procedure: INTRAMEDULLARY (IM) NAIL RIGHT HIP;  Surgeon: Renette Butters, MD;  Location: [facility];  Service: Orthopedics;  Laterality: Right;  . TEE WITHOUT CARDIOVERSION N/A 05/05/2015   Procedure: TRANSESOPHAGEAL ECHOCARDIOGRAM (TEE);  Surgeon: Dixie Dials, MD;  Location: [facility] ENDOSCOPY;  Service: Cardiovascular;  Laterality: N/A;    There were no vitals filed for this visit.      Subjective Assessment - 12/09/16 0940    Subjective "okay"   Patient is accompained by: Family member   Currently in Pain? Yes   Pain Score 5    Pain Location Shoulder   Pain Orientation Right   Pain Descriptors / Indicators Aching;Sore   Pain Type Chronic pain   Pain Onset More than a month ago   Pain Frequency Constant   Aggravating Factors  movement   Pain Relieving Factors keeping it still, good positioning   Multiple Pain Sites No               ADULT SLP TREATMENT - 12/09/16 0942      General Information   Behavior/Cognition Alert;Cooperative;Pleasant mood   Patient Positioning Upright in chair   Oral care provided N/A     Treatment Provided   Treatment provided Cognitive-Linquistic     Cognitive-Linquistic Treatment   Treatment focused on Aphasia;Apraxia;Patient/family/caregiver education   Skilled Treatment SLP targeted simple wh questions re: hobbies, favorite recipes and ingredients, names of family members. Patient requires frequent cues to persist in verbal expression task, often responding "I don't know" or looking to her son  to speak for her. SLP educated pt re: multimodal communication attempts, provided moderate verbal cues for description (color, etc). Pt's son states pt typically points when she needs something in the home, states other family members typically play a "guessing game," listing items as pt responds Y/N. SLP provided education re: multimodal communication boards, photographs of rooms in the house, common activities, favorite foods, places, family members to improve pt's independence in communicating her wants/needs outside of her immediate surroundings.       Assessment / Recommendations / Plan   Plan Continue with current plan of care     Progression Toward Goals   Progression toward goals Progressing toward goals          SLP Education - Jan 07, 2017 1246    Education provided Yes   Education Details Maximizing pt's functional communication, use of AAC to supplement/augment verbal communication   Person(s) Educated Patient;Child(ren)   Methods Explanation;Verbal cues;Demonstration   Comprehension Verbalized understanding;Need further instruction            SLP Long Term Goals - 01-07-2017 1252      SLP LONG TERM GOAL #1   Title pt will recite rote speech tasks using speech compensations for fluent speech 70% success   Status Achieved     SLP LONG TERM GOAL #2   Title pt will answer wh ?s with multimodal communication PRN, functionally, with rare min A   Time 3   Period Weeks   Status On-going     SLP LONG TERM GOAL #3   Title pt will communicate wants and needs functionally using speech compensations    Time 3   Period Weeks   Status On-going     SLP LONG TERM GOAL #4   Title pt will respond to family's appropriate cues for fluent speech by improved fluency 50% of the time   Time 3   Period Weeks   Status On-going     SLP LONG TERM GOAL #5   Title pt will perform apraxia/speech HEP with 60% fluent speech with rare min A   Status Deferred          Plan - January 07, 2017 1248    Clinical Impression Statement Pt answered simple questions re: family names, shows she watches and favorite recipes with questioning cues from her son and mod A from [place]. Requires frequent cues to persist in verbal communication attempts. Provided education to pt and son re: use of AAC to maximize pt's functional communication. Continue skilled ST to maximize communication to reduce caregiver burden.   Speech Therapy Frequency 2x / week   Duration 4 weeks   Treatment/Interventions Functional tasks;Patient/family education;Compensatory  strategies;Oral motor exercises;Internal/external aids;SLP instruction and feedback;Multimodal communcation approach   Potential to Achieve Goals Fair   Potential Considerations Severity of impairments;Cooperation/participation level   SLP Home Exercise Plan low-tech AAC tasks   Consulted and Agree with Plan of Care Patient;Family member/caregiver   Family Member Consulted son,       Patient will benefit from skilled therapeutic intervention in order to improve the following deficits and impairments:   Aphasia  Verbal apraxia      G-Codes - 01-07-2017 1254    Functional Assessment Tool Used noms   Functional Limitations Motor speech   Motor Speech Current Status [phone]) At least 60 percent but less than 80 percent impaired, limited or restricted   Motor Speech Goal Status (O1308) At least 60 percent but less than 80 percent impaired, limited or restricted  Problem List Patient Active Problem List   Diagnosis Date Noted  . Muscle spasticity 01/20/2016  . Bilateral lower extremity edema 12/23/2015  . Acute confusional state 10/28/2015  . Right spastic hemiparesis ([place]) 10/07/2015  . Right shoulder pain 07/24/2015  . Fracture, intertrochanteric, right femur ([place]) 06/10/2015  . Stercoral ulcer of rectum 05/16/2015  . GI bleeding 05/15/2015  . GI bleed 05/13/2015  . BRBPR (bright red blood per rectum) 05/13/2015  . CHF (congestive heart failure) ([place])   . Diabetes ([place])   . Hypertension   . History of stroke   . Lower GI bleed   . Left-sided cerebrovascular accident (CVA) ([place]) 04/28/2015   Speech Therapy Progress Note  Dates of Reporting Period: 10/20/16 to 12/09/16  Subjective: Patient has been seen for 10 visits addressing language and motor speech impairments  Objective Measurements: Pt requires mod A for simple verbal expression tasks.  Goal Update: Progressing  Plan: Continue plan as written. See Clinical Impressions  Reason Skilled Services are Required:  Address language and motor speech impairments in order to maximize functional communication and improve quality of life.    Deneise Lever, [place], [place] 12/09/2016, 12:55 PM  [facility] [address] [place], [place], [address] Phone: [phone]   Fax:  [phone]   Name: [name] MRN: [id number] Date of Birth: March 08, 1946

## 2016-12-10 NOTE — Therapy (Signed)
[facility] [address] [place], [place], [address] Phone: [phone]   Fax:  [phone]  Physical Therapy Treatment  Patient Details  Name: [name] MRN: [id number] Date of Birth: 01-06-1946 Referring Provider: Dr. Krista Blue  Encounter Date: 12/09/2016      PT End of Session - 12/09/16 0855    Visit Number 12   Number of Visits 17   Date for PT Re-Evaluation 12/19/16   Authorization Type G-CODE AND PROGRESS NOTE EVERY 10TH VISIT. Medicare and BCBS 2nd   PT Start Time [phone]   PT Stop Time 0930   PT Time Calculation (min) 40 min   Equipment Utilized During Treatment Gait belt   Activity Tolerance Patient tolerated treatment well   Behavior During Therapy WFL for tasks assessed/performed      Past Medical History:  Diagnosis Date  . Acute blood loss anemia   . CHF (congestive heart failure) (HCC)    EF 27-06%, grade 1 diastolic dysfunction per echo 04/2015  . 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 CVA (cerebral infarction) 04/2015   Started Plavix 04/2015  . Depression   . Diabetes ([place]) 2016   Type II. On insulin  . Enchondroma of bone 2011   left femur.   . Fracture, intertrochanteric, right femur ([place])   . History of lower GI bleeding   . Hyperlipidemia   . Hypertension   . Patent foramen ovale 04/2015   Started on warfarin 04/2015  . Protein calorie malnutrition ([place])   . Stercoral ulcer of rectum 05/16/2015    Past Surgical History:  Procedure Laterality Date  . CARDIAC SURGERY     Cath without stent  . COLONOSCOPY N/A 05/15/2015   Procedure: COLONOSCOPY;  Surgeon: Mauri Pole, MD;  Location: [facility] ENDOSCOPY;  Service: Endoscopy;  Laterality: N/A;  . FLEXIBLE SIGMOIDOSCOPY N/A 05/15/2015   Procedure: FLEXIBLE SIGMOIDOSCOPY;  Surgeon: Mauri Pole, MD;  Location: [facility] ENDOSCOPY;  Service: Endoscopy;  Laterality: N/A;  at bedside   . INTRAMEDULLARY (IM) NAIL INTERTROCHANTERIC Right 06/12/2015   Procedure: INTRAMEDULLARY (IM) NAIL RIGHT HIP;  Surgeon: Renette Butters, MD;  Location: [facility];  Service: Orthopedics;  Laterality: Right;  . TEE WITHOUT CARDIOVERSION N/A 05/05/2015   Procedure: TRANSESOPHAGEAL ECHOCARDIOGRAM (TEE);  Surgeon: Dixie Dials, MD;  Location: [facility] ENDOSCOPY;  Service: Cardiovascular;  Laterality: N/A;    There were no vitals filed for this visit.      Subjective Assessment - 12/09/16 0854    Subjective No new complaints. No falls since last session.   Patient is accompained by: Family member   Pertinent History HTN, DM, CHF, CVA, hx of GI bleeds, acute confusional state, aphasia, chronic R shoulder pain (and L shoulder pain starting), anemia, HLD, patent foramen ovale, hx of fall and R hip fracture in 2016   Patient Stated Goals Walk and txf IND or with minor assistance   Currently in Pain? No/denies   Pain Score 4   4/10 with movement        12/09/16 0856  Transfers  Transfers Sit to Stand;Stand to Sit  Sit to Stand 3: Mod assist;With upper extremity assist;From chair/3-in-1;With armrests  Sit to Stand Details Verbal cues for technique;Verbal cues for sequencing;Verbal cues for precautions/safety;Manual facilitation for weight shifting;Manual facilitation for placement;Manual facilitation for weight bearing  Sit to Stand Details (indicate cue type and reason) cues/facilitation to scoot to edge of chair and on technique/sequencing to  stand. assist needed to place right foot under her with knee flexion to assist with standing.   Stand to Sit 3: Mod assist;With upper extremity assist;To chair/3-in-1;With armrests;Uncontrolled descent  Stand to Sit Details (indicate cue type and reason) Verbal cues for sequencing;Verbal cues for technique;Verbal cues for safe use of DME/AE;Manual facilitation for placement;Manual facilitation for weight bearing;Manual facilitation for weight shifting  Stand to Sit  Details cues to reach back, along with cues/facilitation for knee/hip flexion with sitting.   Ambulation/Gait  Ambulation/Gait Yes  Ambulation/Gait Assistance 4: Min assist;3: Mod assist (2 person assist for safety)  Ambulation/Gait Assistance Details initated gait with hemiwalker with 2 person min/mod assist for safety. Pt with increased left/posterior lean with hemiwalker, therefore switched to HHA on left after ~10 feet for remainder of 1st gait trail. Trialed a shorter hemiwalker on second gait rep with same results, pt unable to achieve midline positioning due to increased lateral lean, so switch again to HHA. with cues and increased time pt was able to advance right LE with minimal assist on 2cd gait. cues/facilitation needed for posture and weight shifting as well.                          Ambulation Distance (Feet) 45 Feet (x2)  Assistive device 2 person hand held assist;Hemi-walker  Gait Pattern Step-to pattern;Decreased stride length;Decreased step length - right;Decreased step length - left;Decreased arm swing - right;Decreased arm swing - left;Decreased hip/knee flexion - right;Decreased dorsiflexion - right;Decreased weight shift to left;Decreased weight shift to right;Ataxic;Decreased trunk rotation;Trunk flexed;Narrow base of support;Poor foot clearance - right  Ambulation Surface Level;Indoor  Knee/Hip Exercises: Stretches  Passive Hamstring Stretch Both;2 reps;60 seconds;Limitations  Passive Hamstring Stretch Limitations performed with pt seated in wheelchair  Quad Stretch Right;Limitations  Quad Stretch Limitations seated in wheelchair: passive knee flexion for quad stretch (foot on pillowcase on slide board to allow for easier movements)          PT Short Term Goals - 11/16/16 1622      PT SHORT TERM GOAL #1   Title Pt will be perform HEP with assist from son to improve balance, flexibility and strength. TARGET DATE FOR ALL STGS: 11/17/16   Status Partially Met     PT  SHORT TERM GOAL #2   Title Pt will perform STS txfs with mod A in order to improve functional mobility and decr. caregiver burdern.   Status Partially Met     PT SHORT TERM GOAL #3   Title Attempt amb. as tolerated and appropriate.   Status Achieved           PT Long Term Goals - 11/16/16 1622      PT LONG TERM GOAL #1   Title Pt and pt's son will verbalize CVA risk factors and s/s in order to reduce risk of additional CVA. TARGET DATE FOR ALL LTGS: 12/15/16   Status New     PT LONG TERM GOAL #2   Title Pt will perform STS txfs with min A to decr. caregiver burden and improve functional mobility.    Status New     PT LONG TERM GOAL #3   Title Pt will be able to stand for 5 minutes while perform dynamic balance activities with min A in order to perform ADLs safely, with caregiver.    Status New     PT LONG TERM GOAL #4   Title Pt will improve SIS: mobility score to >/=37.2  to improve quality of life.    Status New     PT LONG TERM GOAL #5   Title Pt will amb. 48' with LRAD and 1 person mod A to improve functional mobility.    Status New            Plan - 12/09/16 1660    Clinical Impression Statement Today's skilled session continued to address gait after LE stretching. Trialed hemiwalker's of various heights without success. Both times it caused pt to have increased left lateral/posterior lean with gait. Once hemiwalker was removed and pt returned to [facility] in front of her, her posture improved to midline. Pt needed varied assistance with right leg advancement during session, however did note attempts of knee flexion to assist with advancement. Pt is making steady progress toward goals and should benefit from continued PT to progress toward unmet goals.                          Clinical Impairments Affecting Rehab Potential CVA was in 2016 and it is unclear how much pt was actually ambulating with HHPT.   PT Treatment/Interventions ADLs/Self Care Home  Management;Biofeedback;Neuromuscular re-education;Balance training;Therapeutic exercise;Therapeutic activities;Functional mobility training;Gait training;Patient/family education;Orthotic Fit/Training;Vestibular   PT Next Visit Plan continue to work on gait with simulated toe cap and AD that works toward midline posture (plan to talk to OT on possible use of RW with/without hand orthotice/platform); continue to work on LE strengthening and Investment banker, corporate and Agree with Plan of Care Patient   Family Member Consulted son-Terry      Patient will benefit from skilled therapeutic intervention in order to improve the following deficits and impairments:  Decreased endurance, Pain, Impaired tone, Decreased balance, Decreased mobility, Difficulty walking, Impaired flexibility, Postural dysfunction, Decreased coordination, Decreased knowledge of use of DME, Decreased strength, Impaired UE functional use  Visit Diagnosis: Spastic hemiplegia of right dominant side as late effect of other cerebrovascular disease ([place])  Unsteadiness on feet  Other abnormalities of gait and mobility  Abnormal posture     Problem List Patient Active Problem List   Diagnosis Date Noted  . Muscle spasticity 01/20/2016  . Bilateral lower extremity edema 12/23/2015  . Acute confusional state 10/28/2015  . Right spastic hemiparesis ([place]) 10/07/2015  . Right shoulder pain 07/24/2015  . Fracture, intertrochanteric, right femur ([place]) 06/10/2015  . Stercoral ulcer of rectum 05/16/2015  . GI bleeding 05/15/2015  . GI bleed 05/13/2015  . BRBPR (bright red blood per rectum) 05/13/2015  . CHF (congestive heart failure) ([place])   . Diabetes ([place])   . Hypertension   . History of stroke   . Lower GI bleed   . Left-sided cerebrovascular accident (CVA) ([place]) 04/28/2015    Willow Ora, PTA, [facility] [address] [place], [place] [address] [phone] 12/11/16, 1:44 PM   Name: AMAZING COWMAN MRN: [id number] Date of Birth: [birth year]

## 2016-12-14 ENCOUNTER — Ambulatory Visit: Payer: Medicare Other

## 2016-12-14 ENCOUNTER — Encounter: Payer: Self-pay | Admitting: Occupational Therapy

## 2016-12-14 ENCOUNTER — Ambulatory Visit: Payer: Medicare Other | Admitting: Occupational Therapy

## 2016-12-14 ENCOUNTER — Telehealth: Payer: Self-pay | Admitting: Neurology

## 2016-12-14 DIAGNOSIS — R2681 Unsteadiness on feet: Secondary | ICD-10-CM

## 2016-12-14 DIAGNOSIS — M79601 Pain in right arm: Secondary | ICD-10-CM

## 2016-12-14 DIAGNOSIS — R41844 Frontal lobe and executive function deficit: Secondary | ICD-10-CM | POA: Diagnosis not present

## 2016-12-14 DIAGNOSIS — M79641 Pain in right hand: Secondary | ICD-10-CM

## 2016-12-14 DIAGNOSIS — I69851 Hemiplegia and hemiparesis following other cerebrovascular disease affecting right dominant side: Secondary | ICD-10-CM

## 2016-12-14 DIAGNOSIS — G8929 Other chronic pain: Secondary | ICD-10-CM

## 2016-12-14 DIAGNOSIS — R482 Apraxia: Secondary | ICD-10-CM

## 2016-12-14 DIAGNOSIS — M25511 Pain in right shoulder: Principal | ICD-10-CM

## 2016-12-14 DIAGNOSIS — R4701 Aphasia: Secondary | ICD-10-CM

## 2016-12-14 DIAGNOSIS — R2689 Other abnormalities of gait and mobility: Secondary | ICD-10-CM

## 2016-12-14 DIAGNOSIS — R293 Abnormal posture: Secondary | ICD-10-CM | POA: Diagnosis not present

## 2016-12-14 NOTE — Therapy (Signed)
[facility] [address] [place], [place], [address] Phone: [phone]   Fax:  [phone]  Speech Language Pathology Treatment  Patient Details  Name: [name] MRN: [id number] Date of Birth: 10-05-1945 Referring Provider: Marcial Pacas, MD  Encounter Date: 12/14/2016      End of Session - 12/14/16 1655    Visit Number 11   Number of Visits 17   Date for SLP Re-Evaluation 12/24/16   SLP Start Time 1607   SLP Stop Time  1615   SLP Time Calculation (min) 44 min   Activity Tolerance Patient tolerated treatment well      Past Medical History:  Diagnosis Date  . Acute blood loss anemia   . CHF (congestive heart failure) (HCC)    EF 37-10%, grade 1 diastolic dysfunction per echo 04/2015  . 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 CVA (cerebral infarction) 04/2015   Started Plavix 04/2015  . Depression   . Diabetes ([place]) 2016   Type II. On insulin  . Enchondroma of bone 2011   left femur.   . Fracture, intertrochanteric, right femur ([place])   . History of lower GI bleeding   . Hyperlipidemia   . Hypertension   . Patent foramen ovale 04/2015   Started on warfarin 04/2015  . Protein calorie malnutrition ([place])   . Stercoral ulcer of rectum 05/16/2015    Past Surgical History:  Procedure Laterality Date  . CARDIAC SURGERY     Cath without stent  . COLONOSCOPY N/A 05/15/2015   Procedure: COLONOSCOPY;  Surgeon: Mauri Pole, MD;  Location: [facility] ENDOSCOPY;  Service: Endoscopy;  Laterality: N/A;  . FLEXIBLE SIGMOIDOSCOPY N/A 05/15/2015   Procedure: FLEXIBLE SIGMOIDOSCOPY;  Surgeon: Mauri Pole, MD;  Location: [facility] ENDOSCOPY;  Service: Endoscopy;  Laterality: N/A;  at bedside  . INTRAMEDULLARY (IM) NAIL INTERTROCHANTERIC Right 06/12/2015   Procedure: INTRAMEDULLARY (IM) NAIL RIGHT HIP;  Surgeon: Renette Butters, MD;  Location: [facility];  Service:  Orthopedics;  Laterality: Right;  . TEE WITHOUT CARDIOVERSION N/A 05/05/2015   Procedure: TRANSESOPHAGEAL ECHOCARDIOGRAM (TEE);  Surgeon: Dixie Dials, MD;  Location: [facility] ENDOSCOPY;  Service: Cardiovascular;  Laterality: N/A;    There were no vitals filed for this visit.      Subjective Assessment - 12/14/16 1534    Subjective "Yeah"   Currently in Pain? Yes   Pain Score 5    Pain Location Shoulder   Pain Orientation Right   Pain Descriptors / Indicators Aching   Pain Type Chronic pain   Pain Onset More than a month ago   Pain Frequency Intermittent   Aggravating Factors  moving it   Pain Relieving Factors resting               ADULT SLP TREATMENT - 12/14/16 1536      General Information   Behavior/Cognition Alert;Cooperative;Pleasant mood     Treatment Provided   Treatment provided Cognitive-Linquistic     Cognitive-Linquistic Treatment   Treatment focused on Aphasia;Apraxia   Skilled Treatment SLP targeted responsive naming as well as speech fluency and used writing to augment pt's communication, with mod A consistently for fluency of word production, consistent mod to max A for writing word.  Pt was successful at spelling word 60% of the time if started with first letter by SLP. SLP again stressed need for pt to communicate by any means necessary: drawing, spelling, pointing to pictures, pointing  to objects, etc. SLP suggested practicing with spelling at home, practicing with multiple reps of salient words, and putting together of a communication book.     Assessment / Recommendations / Plan   Plan Continue with current plan of care     Progression Toward Goals   Progression toward goals Progressing toward goals          SLP Education - 12/14/16 1654    Education provided Yes   Education Details Pt and son told ST will likely end next week as pt can continue with ST tasks at home   Person(s) Educated Patient;Child(ren)   Methods Explanation   Comprehension  Verbalized understanding            SLP Long Term Goals - 12/14/16 1537      SLP LONG TERM GOAL #1   Title pt will recite rote speech tasks using speech compensations for fluent speech 70% success   Status Achieved     SLP LONG TERM GOAL #2   Title pt will answer wh ?s with multimodal communication PRN, functionally, with rare min A   Time 2   Period Weeks   Status On-going     SLP LONG TERM GOAL #3   Title pt will communicate wants and needs functionally using speech compensations    Time 2   Period Weeks   Status On-going     SLP LONG TERM GOAL #4   Title pt will respond to family's appropriate cues for fluent speech by improved fluency 50% of the time   Time 2   Period Weeks   Status On-going     SLP LONG TERM GOAL #5   Title pt will perform apraxia/speech HEP with 60% fluent speech with rare min A   Status Deferred          Plan - 12/14/16 1656    Clinical Impression Statement In this session focusing on expressive communication, frequent dysfluency noted with 90% of spontaneous verbal expression - pt was more successful at spelling if provided initial letter of word, simple copied drawings were very rudimentary and SLP wonders if pt has visuospatial deficits. Skilled ST to cont through next week at x2/week to maximize communication skills.   Speech Therapy Frequency 2x / week   Duration 4 weeks  or 8 more visits (visit #17)   Treatment/Interventions Functional tasks;Patient/family education;Compensatory strategies;Oral motor exercises;Internal/external aids;SLP instruction and feedback;Multimodal communcation approach   Potential to Achieve Goals Fair   Potential Considerations Severity of impairments;Cooperation/participation level   Consulted and Agree with Plan of Care Patient;Family member/caregiver   Family Member Consulted son,       Patient will benefit from skilled therapeutic intervention in order to improve the following deficits and impairments:    Aphasia  Verbal apraxia    Problem List Patient Active Problem List   Diagnosis Date Noted  . Muscle spasticity 01/20/2016  . Bilateral lower extremity edema 12/23/2015  . Acute confusional state 10/28/2015  . Right spastic hemiparesis ([place]) 10/07/2015  . Right shoulder pain 07/24/2015  . Fracture, intertrochanteric, right femur ([place]) 06/10/2015  . Stercoral ulcer of rectum 05/16/2015  . GI bleeding 05/15/2015  . GI bleed 05/13/2015  . BRBPR (bright red blood per rectum) 05/13/2015  . CHF (congestive heart failure) ([place])   . Diabetes ([place])   . Hypertension   . History of stroke   . Lower GI bleed   . Left-sided cerebrovascular accident (CVA) ([place]) 04/28/2015    [place] ,[place], [place]  12/14/2016, 4:59 PM  [facility] [address] [place], [place], [address] Phone: [phone]   Fax:  [phone]   Name: KEYANI RIGDON MRN: [id number] Date of Birth: 1945-08-11

## 2016-12-14 NOTE — Telephone Encounter (Signed)
Please let patient know I have referred her back to orthopedic surgeon for evaluation and treatment of her right shoulder pain

## 2016-12-14 NOTE — Therapy (Signed)
[facility] [address] [place], [place], [address] Phone: [phone]   Fax:  [phone]  Occupational Therapy Treatment  Patient Details  Name: [name] MRN: [id number] Date of Birth: 13-Feb-1946 Referring Provider: Dr.Yan  Encounter Date: 12/14/2016      OT End of Session - 12/14/16 1639    Visit Number 8   Number of Visits 16   Date for OT Re-Evaluation 01/25/17   Authorization Type medicare, BC[employer] as secondary.  Pt will need G code and PN every 10th visit   Authorization Time Period 60 days   Authorization - Visit Number 8   Authorization - Number of Visits 10   OT Start Time 0263   OT Stop Time 1528   OT Time Calculation (min) 42 min   Activity Tolerance Patient tolerated treatment well      Past Medical History:  Diagnosis Date  . Acute blood loss anemia   . CHF (congestive heart failure) (HCC)    EF 78-58%, grade 1 diastolic dysfunction per echo 04/2015  . 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 CVA (cerebral infarction) 04/2015   Started Plavix 04/2015  . Depression   . Diabetes ([place]) 2016   Type II. On insulin  . Enchondroma of bone 2011   left femur.   . Fracture, intertrochanteric, right femur ([place])   . History of lower GI bleeding   . Hyperlipidemia   . Hypertension   . Patent foramen ovale 04/2015   Started on warfarin 04/2015  . Protein calorie malnutrition ([place])   . Stercoral ulcer of rectum 05/16/2015    Past Surgical History:  Procedure Laterality Date  . CARDIAC SURGERY     Cath without stent  . COLONOSCOPY N/A 05/15/2015   Procedure: COLONOSCOPY;  Surgeon: Mauri Pole, MD;  Location: [facility] ENDOSCOPY;  Service: Endoscopy;  Laterality: N/A;  . FLEXIBLE SIGMOIDOSCOPY N/A 05/15/2015   Procedure: FLEXIBLE SIGMOIDOSCOPY;  Surgeon: Mauri Pole, MD;  Location: [facility] ENDOSCOPY;  Service: Endoscopy;  Laterality: N/A;   at bedside  . INTRAMEDULLARY (IM) NAIL INTERTROCHANTERIC Right 06/12/2015   Procedure: INTRAMEDULLARY (IM) NAIL RIGHT HIP;  Surgeon: Renette Butters, MD;  Location: [facility];  Service: Orthopedics;  Laterality: Right;  . TEE WITHOUT CARDIOVERSION N/A 05/05/2015   Procedure: TRANSESOPHAGEAL ECHOCARDIOGRAM (TEE);  Surgeon: Dixie Dials, MD;  Location: [facility] ENDOSCOPY;  Service: Cardiovascular;  Laterality: N/A;    There were no vitals filed for this visit.      Subjective Assessment - 12/14/16 1455    Subjective  Ok when asked to stand up   Patient is accompained by: Family member  son   Pertinent History see epic pt s/p L CVA   Patient Stated Goals pt unable to state due to aphasia can answer yes and no   Currently in Pain? Yes   Pain Score 5    Pain Location Shoulder   Pain Orientation Right   Pain Descriptors / Indicators Aching   Pain Type Chronic pain   Pain Onset More than a month ago   Pain Frequency Intermittent   Aggravating Factors  movement   Pain Relieving Factors rest                      OT Treatments/Exercises (OP) - 12/14/16 0001      Neurological Re-education Exercises   Other Exercises 1 Neuro re ed to address sit to stand  from elevated surface (son has not yet purchased cushion for wheelchair - again stressed importance of obtaining so that pt can begin to carry over what she is learning in the clinic - son again stated he would obtain and took a picture of the cushion). Also addressed functional use of RUE with advancing a walker and using RUE as stabilibizer for standing activities.  Pt able to do with cues only.     Other Exercises 2 Also addressed standing tolerance in order to increase time pt can be standing and maintain own balance so that caregiver can assist with ADL's. Pt able to stand for approximately 1 minute with close supervision with the walker. Addressed ability for pt to stand at counter with one UE support (RUE as stabilzer) while  engaging in functional task with LUE. Pt able to complete with vc's, close supervision and encouragement.  Addressed active stand to sit - pt needs mod facilitation. Provided education to son on standing next to pt to assist with sit to stand and stand to sit in order to prevent pt from leaning back and allowing son to lift  her into standing. Son verbalized understanding however will need to practice next session.    Sponges --                  OT Short Term Goals - 12/14/16 1635      OT SHORT TERM GOAL #1   Title Pt and family will be mod I with HEP- 12/28/2016 (pt has only attended OT for 4 visits since eval therefore STG's will assume LTG goal date of 5/85/2778 and will recertify today   Status On-going     OT SHORT TERM GOAL #2   Title Pt will report pain in RUE no greater than 8/10 with PROM home program    Status Achieved  5/10     OT SHORT TERM GOAL #3   Title Pt will require mod a for sit to stand/ stand to sit for toilet transfers and LB self care.    Status Achieved  from elevated surface     OT SHORT TERM GOAL #4   Title Pt will require no more than supervision for static standing balance with 1 UE support for pre ADL function.     Status Achieved           OT Long Term Goals - 12/14/16 1635      OT LONG TERM GOAL #1   Title Pt and family will be mod I with upgraded HEP prn- 01/25/2017   Status On-going     OT LONG TERM GOAL #2   Title Pt will rate pain in RUE no greater than 4/10 with home program and with movement to arm during ADL activity   Status On-going     OT LONG TERM GOAL #3   Title Pt will be min a for toilet transfers.   Status On-going     OT LONG TERM GOAL #4   Title Pt will be able to maintain standing balance during simple ADL activity at sink without UE support and with supervision   Status On-going               Plan - 12/14/16 1635    Clinical Impression Statement Pt with progress toward goals today. Pt with slowly  improving functional mobility   Rehab Potential Fair   Current Impairments/barriers affecting progress: given severity of deficits as well as number of deficits, length since onset  of stroke, pain   OT Frequency 2x / week   OT Duration 8 weeks   OT Treatment/Interventions Self-care/ADL training;Electrical Stimulation;Moist Heat;Ultrasound;Therapeutic exercise;Neuromuscular education;DME and/or AE instruction;Passive range of motion;Manual Therapy;Functional Mobility Training;Splinting;Therapeutic activities;Visual/perceptual remediation/compensation;Patient/family education;Balance training   Plan NMR for RUE, trunk, functional mobility, postural orientation, balance, alignment, manual therapy for RUE.    Consulted and Agree with Plan of Care Patient;Family member/caregiver   Family Member Consulted son      Patient will benefit from skilled therapeutic intervention in order to improve the following deficits and impairments:  Abnormal gait, Decreased activity tolerance, Decreased balance, Decreased cognition, Decreased range of motion, Decreased mobility, Decreased knowledge of use of DME, Difficulty walking, Impaired UE functional use, Impaired tone, Pain  Visit Diagnosis: Spastic hemiplegia of right dominant side as late effect of other cerebrovascular disease (HCC)  Unsteadiness on feet  Abnormal posture  Pain in right arm  Pain in right hand  Frontal lobe and executive function deficit    Problem List Patient Active Problem List   Diagnosis Date Noted  . Muscle spasticity 01/20/2016  . Bilateral lower extremity edema 12/23/2015  . Acute confusional state 10/28/2015  . Right spastic hemiparesis ([place]) 10/07/2015  . Right shoulder pain 07/24/2015  . Fracture, intertrochanteric, right femur ([place]) 06/10/2015  . Stercoral ulcer of rectum 05/16/2015  . GI bleeding 05/15/2015  . GI bleed 05/13/2015  . BRBPR (bright red blood per rectum) 05/13/2015  . CHF (congestive heart  failure) ([place])   . Diabetes ([place])   . Hypertension   . History of stroke   . Lower GI bleed   . Left-sided cerebrovascular accident (CVA) ([place]) 04/28/2015    Quay Burow, OTR/L 12/14/2016, 4:41 PM  [facility] [address] [place], [place], [address] Phone: [phone]   Fax:  [phone]  Name: [name] MRN: [id number] Date of Birth: [birth year]

## 2016-12-14 NOTE — Telephone Encounter (Signed)
-----   Message from Rivka Barbara, OT sent at 12/14/2016  8:23 AM EDT ----- Dr,=. Krista Blue, Good morning - not sure if you had a chance to read my note regarding Ms. Richardson Landry? Thanks so much! Forde Radon, MS, OTR/L  ----- Message ----- From: Rivka Barbara, OT Sent: 12/02/2016   9:55 AM To: Marcial Pacas, MD  Dr. Krista Blue, I am the OT working with this patient currently in neuro outpt - she had a botox injection with you on 10/27/2016.  The patient is still in extreme pain and has great difficulty tolerating any intervention with the UE.  I was wondering if she might possibly be a candidate for some time of injection for pain itself as the botox has not seemed to lessen this at all.  If we could reduce the pain even just a little I might have better success improve her range of motion and alignment.  Any insights you could provide would be greatly appreciated!! Thanks, Forde Radon, MS, OTR/L [facility]

## 2016-12-14 NOTE — Therapy (Signed)
[facility] [address] [place], [place], [address] Phone: [phone]   Fax:  [phone]  Physical Therapy Treatment  Patient Details  Name: [name] MRN: [id number] Date of Birth: [birth year] Referring Provider: Dr. Krista Blue  Encounter Date: 12/14/2016      PT End of Session - 12/14/16 1624    Visit Number 13   Number of Visits 17   Date for PT Re-Evaluation 12/19/16   Authorization Type G-CODE AND PROGRESS NOTE EVERY 10TH VISIT. Medicare and BCBS 2nd   PT Start Time 1318   PT Stop Time 1400   PT Time Calculation (min) 42 min   Equipment Utilized During Treatment Gait belt   Activity Tolerance Patient tolerated treatment well;Patient limited by pain  pt with 3/10 L hip pain during amb. and required rest break   Behavior During Therapy [facility] for tasks assessed/performed      Past Medical History:  Diagnosis Date  . Acute blood loss anemia   . CHF (congestive heart failure) (HCC)    EF 50-27%, grade 1 diastolic dysfunction per echo 04/2015  . 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 CVA (cerebral infarction) 04/2015   Started Plavix 04/2015  . Depression   . Diabetes ([place]) 2016   Type II. On insulin  . Enchondroma of bone 2011   left femur.   . Fracture, intertrochanteric, right femur ([place])   . History of lower GI bleeding   . Hyperlipidemia   . Hypertension   . Patent foramen ovale 04/2015   Started on warfarin 04/2015  . Protein calorie malnutrition ([place])   . Stercoral ulcer of rectum 05/16/2015    Past Surgical History:  Procedure Laterality Date  . CARDIAC SURGERY     Cath without stent  . COLONOSCOPY N/A 05/15/2015   Procedure: COLONOSCOPY;  Surgeon: Mauri Pole, MD;  Location: [facility] ENDOSCOPY;  Service: Endoscopy;  Laterality: N/A;  . FLEXIBLE SIGMOIDOSCOPY N/A 05/15/2015   Procedure: FLEXIBLE SIGMOIDOSCOPY;  Surgeon: Mauri Pole, MD;  Location: [facility] ENDOSCOPY;  Service: Endoscopy;  Laterality: N/A;  at bedside  . INTRAMEDULLARY (IM) NAIL INTERTROCHANTERIC Right 06/12/2015   Procedure: INTRAMEDULLARY (IM) NAIL RIGHT HIP;  Surgeon: Renette Butters, MD;  Location: [place];  Service: Orthopedics;  Laterality: Right;  . TEE WITHOUT CARDIOVERSION N/A 05/05/2015   Procedure: TRANSESOPHAGEAL ECHOCARDIOGRAM (TEE);  Surgeon: Dixie Dials, MD;  Location: [facility] ENDOSCOPY;  Service: Cardiovascular;  Laterality: N/A;    There were no vitals filed for this visit.      Subjective Assessment - 12/14/16 1409    Subjective Pt denied falls or changes since last visit.    Pertinent History HTN, DM, CHF, CVA, hx of GI bleeds, acute confusional state, aphasia, chronic R shoulder pain (and L shoulder pain starting), anemia, HLD, patent foramen ovale, hx of fall and R hip fracture in 2016   Patient Stated Goals Walk and txf IND or with minor assistance   Currently in Pain? Yes   Pain Score 5    Pain Location Shoulder   Pain Orientation Right   Pain Descriptors / Indicators Aching   Pain Type Chronic pain   Pain Onset More than a month ago   Pain Frequency Intermittent   Aggravating Factors  movement   Pain Relieving Factors rest  [place] Adult PT Treatment/Exercise - 12/14/16 1617      Transfers   Transfers Sit to Stand;Stand to Sit   Sit to Stand 3: Mod assist;With upper extremity assist;From chair/3-in-1;With armrests   Sit to Stand Details Verbal cues for technique;Verbal cues for sequencing;Verbal cues for precautions/safety;Manual facilitation for weight shifting;Manual facilitation for placement;Manual facilitation for weight bearing   Sit to Stand Details (indicate cue type and reason) Cues for technique and to scoot towards edge of chair.    Stand to Sit 3: Mod assist;With upper extremity assist;To chair/3-in-1;With armrests;Uncontrolled descent;4: Min assist   Stand to Sit Details  (indicate cue type and reason) Verbal cues for sequencing;Verbal cues for technique;Verbal cues for safe use of DME/AE;Manual facilitation for placement;Manual facilitation for weight bearing;Manual facilitation for weight shifting   Stand to Sit Details Cues to reach back for w/c arm rest and to bend at hips and knees.     Ambulation/Gait   Ambulation/Gait Yes   Ambulation/Gait Assistance 4: Min assist;3: Mod assist   Ambulation/Gait Assistance Details R simulate shoe cap donned prior to amb. 2 person assist with RW (R hand orthosis on RW) with PT tech and PT assisting pt in upright position, advance RLE and to initiate stepping. Pt also performed lateral/ant weight shifting with RW and cues/demo for technique. PT performed passive R knee flexion (3x30sec.) to improve ROM and cues pt to attempt initial R hamstring contraction.    Ambulation Distance (Feet) 15 Feet  7   Assistive device Rolling walker  with R hand orthosis   Gait Pattern Step-to pattern;Decreased stride length;Decreased step length - right;Decreased step length - left;Decreased arm swing - right;Decreased arm swing - left;Decreased hip/knee flexion - right;Decreased dorsiflexion - right;Decreased weight shift to left;Decreased weight shift to right;Ataxic;Decreased trunk rotation;Trunk flexed;Narrow base of support;Poor foot clearance - right   Ambulation Surface Level;Indoor                PT Education - 12/14/16 1623    Education provided Yes   Education Details PT educated pt and pt's son that pt's next session will focus on assessing remaining goals and then d/c, as pt has reached maximal functional gains.    Person(s) Educated Patient;Child(ren)   Methods Explanation   Comprehension Verbalized understanding          PT Short Term Goals - 11/16/16 1622      PT SHORT TERM GOAL #1   Title Pt will be perform HEP with assist from son to improve balance, flexibility and strength. TARGET DATE FOR ALL STGS: 11/17/16    Status Partially Met     PT SHORT TERM GOAL #2   Title Pt will perform STS txfs with mod A in order to improve functional mobility and decr. caregiver burdern.   Status Partially Met     PT SHORT TERM GOAL #3   Title Attempt amb. as tolerated and appropriate.   Status Achieved           PT Long Term Goals - 11/16/16 1622      PT LONG TERM GOAL #1   Title Pt and pt's son will verbalize CVA risk factors and s/s in order to reduce risk of additional CVA. TARGET DATE FOR ALL LTGS: 12/15/16   Status New     PT LONG TERM GOAL #2   Title Pt will perform STS txfs with min A to decr. caregiver burden and improve functional mobility.    Status New     PT  LONG TERM GOAL #3   Title Pt will be able to stand for 5 minutes while perform dynamic balance activities with min A in order to perform ADLs safely, with caregiver.    Status New     PT LONG TERM GOAL #4   Title Pt will improve SIS: mobility score to >/=37.2 to improve quality of life.    Status New     PT LONG TERM GOAL #5   Title Pt will amb. 45' with LRAD and 1 person mod A to improve functional mobility.    Status New               Plan - 12/14/16 1624    Clinical Impression Statement Pt was able to perform static standing with RW and R hand orthosis on RW, for 10 seconds with S, but then required assist to maintain upright position 2/2 fatigue and weakness. Pt continues to require assist during amb. and extensive cues to improve posture and gait deviations. PT will assess remaining goals next session and d/c. Pt will continue to work with OT to improve UE, ADLs and trunk posture.    Clinical Impairments Affecting Rehab Potential CVA was in 2016 and it is unclear how much pt was actually ambulating with HHPT.   PT Treatment/Interventions ADLs/Self Care Home Management;Biofeedback;Neuromuscular re-education;Balance training;Therapeutic exercise;Therapeutic activities;Functional mobility training;Gait  training;Patient/family education;Orthotic Fit/Training;Vestibular   PT Next Visit Plan Check goals and d/c.    Consulted and Agree with Plan of Care Patient   Family Member Consulted son-Terry      Patient will benefit from skilled therapeutic intervention in order to improve the following deficits and impairments:  Decreased endurance, Pain, Impaired tone, Decreased balance, Decreased mobility, Difficulty walking, Impaired flexibility, Postural dysfunction, Decreased coordination, Decreased knowledge of use of DME, Decreased strength, Impaired UE functional use  Visit Diagnosis: Spastic hemiplegia of right dominant side as late effect of other cerebrovascular disease ([place])  Other abnormalities of gait and mobility     Problem List Patient Active Problem List   Diagnosis Date Noted  . Muscle spasticity 01/20/2016  . Bilateral lower extremity edema 12/23/2015  . Acute confusional state 10/28/2015  . Right spastic hemiparesis ([place]) 10/07/2015  . Right shoulder pain 07/24/2015  . Fracture, intertrochanteric, right femur ([place]) 06/10/2015  . Stercoral ulcer of rectum 05/16/2015  . GI bleeding 05/15/2015  . GI bleed 05/13/2015  . BRBPR (bright red blood per rectum) 05/13/2015  . CHF (congestive heart failure) ([place])   . Diabetes ([place])   . Hypertension   . History of stroke   . Lower GI bleed   . Left-sided cerebrovascular accident (CVA) ([place]) 04/28/2015    Miller,Jennifer L 12/14/2016, 4:27 PM  [facility] [address] [place], [place], [address] Phone: [phone]   Fax:  [phone]  Name: [name] MRN: [id number] Date of Birth: 1945-11-19  Geoffry Paradise, PT,DPT 12/14/16 4:28 PM Phone: [phone] Fax: [phone]

## 2016-12-14 NOTE — Telephone Encounter (Signed)
Rn spoke with son that patient was referred to orthopedic surgery on right shoulder. Rn stated the referral staff will call on the details. Rn stated it may 3 to 5 days. Pts son verbalized understanding.

## 2016-12-15 ENCOUNTER — Ambulatory Visit: Payer: Medicare Other | Admitting: Speech Pathology

## 2016-12-15 ENCOUNTER — Ambulatory Visit: Payer: Medicare Other | Admitting: Occupational Therapy

## 2016-12-15 ENCOUNTER — Ambulatory Visit: Payer: Medicare Other

## 2016-12-15 ENCOUNTER — Telehealth: Payer: Self-pay | Admitting: Neurology

## 2016-12-15 DIAGNOSIS — M25511 Pain in right shoulder: Principal | ICD-10-CM

## 2016-12-15 DIAGNOSIS — G8929 Other chronic pain: Secondary | ICD-10-CM

## 2016-12-15 NOTE — Telephone Encounter (Signed)
-----   Message from Rivka Barbara, OT sent at 12/14/2016  4:46 PM EDT ----- Dr Krista Blue, Several of our patients see you at [facility] as well as are followed by a physiatrist for their other rehab needs. She dos not have one as she was in a nursing home for a considerable period of time.  Would it be appropriate to refer her to Dr. Letta Pate and then perhaps we could address two areas with one physician visit?  Thanks so much for your help I appreciate it! Santiago Glad  ----- Message ----- From: Marcial Pacas, MD Sent: 12/14/2016   3:14 PM To: Rivka Barbara, OT  Santiago Glad:  Thanks for your feedback, I will refer her back to orthopedic surgeon for evaluation and treatment of her right shoulder pain.  Tradarius Reinwald ----- Message ----- From: Rivka Barbara, OT Sent: 12/14/2016   8:23 AM To: Marcial Pacas, MD  Dr,=. Krista Blue, Good morning - not sure if you had a chance to read my note regarding Ms. Richardson Landry? Thanks so much! Forde Radon, MS, OTR/L  ----- Message ----- From: Rivka Barbara, OT Sent: 12/02/2016   9:55 AM To: Marcial Pacas, MD  Dr. Krista Blue, I am the OT working with this patient currently in neuro outpt - she had a botox injection with you on 10/27/2016.  The patient is still in extreme pain and has great difficulty tolerating any intervention with the UE.  I was wondering if she might possibly be a candidate for some time of injection for pain itself as the botox has not seemed to lessen this at all.  If we could reduce the pain even just a little I might have better success improve her range of motion and alignment.  Any insights you could provide would be greatly appreciated!! Thanks, Forde Radon, MS, OTR/L [facility]

## 2016-12-16 ENCOUNTER — Encounter: Payer: Medicare Other | Admitting: Occupational Therapy

## 2016-12-20 ENCOUNTER — Ambulatory Visit: Payer: Medicare Other | Admitting: Occupational Therapy

## 2016-12-20 DIAGNOSIS — F17290 Nicotine dependence, other tobacco product, uncomplicated: Secondary | ICD-10-CM | POA: Diagnosis not present

## 2016-12-20 DIAGNOSIS — I10 Essential (primary) hypertension: Secondary | ICD-10-CM | POA: Diagnosis not present

## 2016-12-20 DIAGNOSIS — I639 Cerebral infarction, unspecified: Secondary | ICD-10-CM | POA: Diagnosis not present

## 2016-12-20 DIAGNOSIS — E119 Type 2 diabetes mellitus without complications: Secondary | ICD-10-CM | POA: Diagnosis not present

## 2016-12-20 DIAGNOSIS — E785 Hyperlipidemia, unspecified: Secondary | ICD-10-CM | POA: Diagnosis not present

## 2016-12-20 DIAGNOSIS — I2510 Atherosclerotic heart disease of native coronary artery without angina pectoris: Secondary | ICD-10-CM | POA: Diagnosis not present

## 2016-12-22 ENCOUNTER — Telehealth: Payer: Self-pay | Admitting: Neurology

## 2016-12-22 NOTE — Telephone Encounter (Signed)
Patient referral was sent to [facility]. They attempted to contact the patient several times without success.

## 2016-12-23 ENCOUNTER — Ambulatory Visit: Payer: Medicare Other | Admitting: Occupational Therapy

## 2016-12-27 ENCOUNTER — Encounter: Payer: Self-pay | Admitting: Occupational Therapy

## 2016-12-27 ENCOUNTER — Ambulatory Visit: Payer: Medicare Other | Admitting: Occupational Therapy

## 2016-12-27 DIAGNOSIS — M79641 Pain in right hand: Secondary | ICD-10-CM

## 2016-12-27 DIAGNOSIS — R41844 Frontal lobe and executive function deficit: Secondary | ICD-10-CM

## 2016-12-27 DIAGNOSIS — R293 Abnormal posture: Secondary | ICD-10-CM | POA: Diagnosis not present

## 2016-12-27 DIAGNOSIS — I69851 Hemiplegia and hemiparesis following other cerebrovascular disease affecting right dominant side: Secondary | ICD-10-CM | POA: Diagnosis not present

## 2016-12-27 DIAGNOSIS — R2681 Unsteadiness on feet: Secondary | ICD-10-CM | POA: Diagnosis not present

## 2016-12-27 DIAGNOSIS — M79601 Pain in right arm: Secondary | ICD-10-CM | POA: Diagnosis not present

## 2016-12-27 NOTE — Therapy (Signed)
[facility] [address] [place], [place], [address] Phone: [phone]   Fax:  [phone]  Occupational Therapy Treatment  Patient Details  Name: [name] MRN: [id number] Date of Birth: [birth year] Referring Provider: Dr.Yan  Encounter Date: 12/27/2016      OT End of Session - 12/27/16 1704    Visit Number 9   Number of Visits 16   Date for OT Re-Evaluation 01/25/17   Authorization Type medicare, BCBS as secondary.  Pt will need G code and PN every 10th visit   Authorization Time Period 60 days   Authorization - Visit Number 9   Authorization - Number of Visits 10   OT Start Time 9381   OT Stop Time 1528   OT Time Calculation (min) 41 min   Activity Tolerance Patient tolerated treatment well      Past Medical History:  Diagnosis Date  . Acute blood loss anemia   . CHF (congestive heart failure) (HCC)    EF 01-75%, grade 1 diastolic dysfunction per echo 04/2015  . 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 CVA (cerebral infarction) 04/2015   Started Plavix 04/2015  . Depression   . Diabetes ([place]) 2016   Type II. On insulin  . Enchondroma of bone 2011   left femur.   . Fracture, intertrochanteric, right femur ([place])   . History of lower GI bleeding   . Hyperlipidemia   . Hypertension   . Patent foramen ovale 04/2015   Started on warfarin 04/2015  . Protein calorie malnutrition ([place])   . Stercoral ulcer of rectum 05/16/2015    Past Surgical History:  Procedure Laterality Date  . CARDIAC SURGERY     Cath without stent  . COLONOSCOPY N/A 05/15/2015   Procedure: COLONOSCOPY;  Surgeon: Mauri Pole, MD;  Location[facility] ENDOSCOPY;  Service: Endoscopy;  Laterality: N/A;  . FLEXIBLE SIGMOIDOSCOPY N/A 05/15/2015   Procedure: FLEXIBLE SIGMOIDOSCOPY;  Surgeon: Mauri Pole, MD;  Location: [facility] ENDOSCOPY;  Service: Endoscopy;  Laterality: N/A;   at bedside  . INTRAMEDULLARY (IM) NAIL INTERTROCHANTERIC Right 06/12/2015   Procedure: INTRAMEDULLARY (IM) NAIL RIGHT HIP;  Surgeon: Renette Butters, MD;  Location: [facility];  Service: Orthopedics;  Laterality: Right;  . TEE WITHOUT CARDIOVERSION N/A 05/05/2015   Procedure: TRANSESOPHAGEAL ECHOCARDIOGRAM (TEE);  Surgeon: Dixie Dials, MD;  Location: [facility] ENDOSCOPY;  Service: Cardiovascular;  Laterality: N/A;    There were no vitals filed for this visit.      Subjective Assessment - 12/27/16 1455    Subjective  Son reports that pt has a pressure sore on the back of her R heel. Pt to see MD next Wednesday but have been asked to limit standing and walking in therapy until she sees MD>    Patient is accompained by: Family member  son   Pertinent History see epic pt s/p L CVA   Patient Stated Goals pt unable to state due to aphasia can answer yes and no   Currently in Pain? Yes   Pain Score 4    Pain Location Shoulder   Pain Orientation Right   Pain Descriptors / Indicators Aching   Pain Type Chronic pain   Pain Onset More than a month ago   Pain Frequency Intermittent   Aggravating Factors  moving it   Pain Relieving Factors resting it  OT Treatments/Exercises (OP) - 12/27/16 0001      ADLs   ADL Comments Pt missed last two appts due to schedule conflict and pressure sore on R heel. Pt's son stated that MD wanted limited standing and walking until pt is seen by MD this Wednesday. Pt required signficantly more assistance today due to inactivity at home and missed therapy appts. Addressed wheelchair to mat transfers as well as bed mobility - pt needed max cues for participation. Provided pt 's son with name and address of phsyiatrist as Dr Krista Blue has referred pt. Suggested that pt's son call their office to make appt.  ALso again encouraged son to purchase cushion for wheelchair.      Manual Therapy   Manual Therapy Joint mobilization;Soft tissue  mobilization;Scapular mobilization   Manual therapy comments joint, soft tissue and scap mob to address RUE shoulder pain and mobility. Pt with decreasing pain (4/10) today however remains with severe limitations in PROM, AROM and alignment.                   OT Short Term Goals - 12/27/16 1701      OT SHORT TERM GOAL #1   Title Pt and family will be mod I with HEP- 12/28/2016 (pt has only attended OT for 4 visits since eval therefore STG's will assume LTG goal date of 0/24/0973 and will recertify today   Status On-going     OT SHORT TERM GOAL #2   Title Pt will report pain in RUE no greater than 8/10 with PROM home program    Status Achieved  5/10     OT SHORT TERM GOAL #3   Title Pt will require mod a for sit to stand/ stand to sit for toilet transfers and LB self care.    Status Achieved  from elevated surface     OT SHORT TERM GOAL #4   Title Pt will require no more than supervision for static standing balance with 1 UE support for pre ADL function.     Status Achieved           OT Long Term Goals - 12/27/16 1702      OT LONG TERM GOAL #1   Title Pt and family will be mod I with upgraded HEP prn- 01/25/2017   Status On-going     OT LONG TERM GOAL #2   Title Pt will rate pain in RUE no greater than 4/10 with home program and with movement to arm during ADL activity   Status On-going     OT LONG TERM GOAL #3   Title Pt will be min a for toilet transfers.   Status On-going     OT LONG TERM GOAL #4   Title Pt will be able to maintain standing balance during simple ADL activity at sink without UE support and with supervision   Status On-going               Plan - 12/27/16 1702    Clinical Impression Statement Pt with decreased in ability for basic mobility likely due to missed therapy appts and decreased activity at home.     Rehab Potential Fair   Current Impairments/barriers affecting progress: given severity of deficits as well as number of  deficits, length since onset of stroke, pain   OT Frequency 2x / week   OT Duration 8 weeks   OT Treatment/Interventions Self-care/ADL training;Electrical Stimulation;Moist Heat;Ultrasound;Therapeutic exercise;Neuromuscular education;DME and/or AE instruction;Passive range of motion;Manual Therapy;Functional Mobility  Training;Splinting;Therapeutic activities;Visual/perceptual remediation/compensation;Patient/family education;Balance training   Plan check to see MD recommendation for R heel pressure sore, check with son to get cushion - place pt on hold if he has not purchased cushion as this is necessary for pt to continue to progress.  NMR for RUE, trunk, functional mobility if medicallly allowed.    Consulted and Agree with Plan of Care Patient;Family member/caregiver   Family Member Consulted son      Patient will benefit from skilled therapeutic intervention in order to improve the following deficits and impairments:  Abnormal gait, Decreased activity tolerance, Decreased balance, Decreased cognition, Decreased range of motion, Decreased mobility, Decreased knowledge of use of DME, Difficulty walking, Impaired UE functional use, Impaired tone, Pain  Visit Diagnosis: Spastic hemiplegia of right dominant side as late effect of other cerebrovascular disease (HCC)  Unsteadiness on feet  Abnormal posture  Pain in right arm  Pain in right hand  Frontal lobe and executive function deficit    Problem List Patient Active Problem List   Diagnosis Date Noted  . Muscle spasticity 01/20/2016  . Bilateral lower extremity edema 12/23/2015  . Acute confusional state 10/28/2015  . Right spastic hemiparesis ([place]) 10/07/2015  . Right shoulder pain 07/24/2015  . Fracture, intertrochanteric, right femur ([place]) 06/10/2015  . Stercoral ulcer of rectum 05/16/2015  . GI bleeding 05/15/2015  . GI bleed 05/13/2015  . BRBPR (bright red blood per rectum) 05/13/2015  . CHF (congestive heart failure)  ([place])   . Diabetes ([place])   . Hypertension   . History of stroke   . Lower GI bleed   . Left-sided cerebrovascular accident (CVA) ([place]) 04/28/2015    Quay Burow, OTR/L 12/27/2016, 5:05 PM  [facility] [address] [place], [place], [address] Phone: [phone]   Fax:  [phone]  Name: TANDA MORRISSEY MRN: [id number] Date of Birth: 1945/10/12

## 2016-12-29 ENCOUNTER — Ambulatory Visit (INDEPENDENT_AMBULATORY_CARE_PROVIDER_SITE_OTHER): Payer: Medicare Other | Admitting: Podiatry

## 2016-12-29 ENCOUNTER — Encounter: Payer: Self-pay | Admitting: Podiatry

## 2016-12-29 DIAGNOSIS — M79609 Pain in unspecified limb: Secondary | ICD-10-CM | POA: Diagnosis not present

## 2016-12-29 DIAGNOSIS — B351 Tinea unguium: Secondary | ICD-10-CM | POA: Diagnosis not present

## 2016-12-29 DIAGNOSIS — I96 Gangrene, not elsewhere classified: Secondary | ICD-10-CM

## 2016-12-29 NOTE — Progress Notes (Signed)
Complaint:  Visit Type: Patient returns to my office for continued preventative foot care services. Complaint: Patient states" my nails have grown long and thick and become painful to walk and wear shoes" Patient has been diagnosed with DM with no foot complications. The patient presents for preventative foot care services. No changes to ROS  Podiatric Exam: Vascular: dorsalis pedis and posterior tibial pulses are palpable bilateral. Capillary return is immediate. Temperature gradient is WNL. Skin turgor WNL  Sensorium: Diminished  Semmes Weinstein monofilament test. Normal tactile sensation bilaterally. Nail Exam: Pt has thick disfigured discolored nails with subungual debris noted bilateral entire nail hallux through fifth toenails Ulcer Exam: There is no evidence of ulcer or pre-ulcerative changes or infection. Orthopedic Exam: Muscle tone and strength are WNL. No limitations in general ROM. No crepitus or effusions noted. Foot type and digits show no abnormalities. Bony prominences are unremarkable. Skin: No Porokeratosis. No infection or ulcers.  Skin necrosis with peeling right heel.  Diagnosis:  Onychomycosis, , Pain in right toe, pain in left toes  Treatment & Plan Procedures and Treatment: Consent by patient was obtained for treatment procedures. The patient understood the discussion of treatment and procedures well. All questions were answered thoroughly reviewed. Debridement of mycotic and hypertrophic toenails, 1 through 5 bilateral and clearing of subungual debris. No ulceration, no infection noted. Heel pillow was dispensed. Return Visit-Office Procedure: Patient instructed to return to the office for a follow up visit 3 months for continued evaluation and treatment.    Gardiner Barefoot DPM

## 2016-12-30 ENCOUNTER — Ambulatory Visit: Payer: Medicare Other | Admitting: Occupational Therapy

## 2017-01-03 ENCOUNTER — Encounter: Payer: Self-pay | Admitting: Physical Medicine & Rehabilitation

## 2017-01-12 ENCOUNTER — Ambulatory Visit: Payer: Medicare Other | Attending: Neurology | Admitting: Occupational Therapy

## 2017-01-12 ENCOUNTER — Encounter: Payer: Self-pay | Admitting: Occupational Therapy

## 2017-01-12 ENCOUNTER — Ambulatory Visit: Payer: Medicare Other

## 2017-01-12 DIAGNOSIS — M79601 Pain in right arm: Secondary | ICD-10-CM

## 2017-01-12 DIAGNOSIS — M79641 Pain in right hand: Secondary | ICD-10-CM | POA: Diagnosis not present

## 2017-01-12 DIAGNOSIS — R293 Abnormal posture: Secondary | ICD-10-CM | POA: Diagnosis not present

## 2017-01-12 DIAGNOSIS — R2681 Unsteadiness on feet: Secondary | ICD-10-CM | POA: Diagnosis not present

## 2017-01-12 DIAGNOSIS — I69851 Hemiplegia and hemiparesis following other cerebrovascular disease affecting right dominant side: Secondary | ICD-10-CM | POA: Diagnosis not present

## 2017-01-12 DIAGNOSIS — R41844 Frontal lobe and executive function deficit: Secondary | ICD-10-CM

## 2017-01-12 NOTE — Therapy (Signed)
[facility] [address] [place], [place], [address] Phone: [phone]   Fax:  [phone]  Physical Therapy Treatment  Patient Details  Name: [name] MRN: [id number] Date of Birth: Nov 30, 1945 Referring Provider: Dr. Krista Blue  Encounter Date: 01/12/2017      PT End of Session - 01/12/17 1433    Visit Number 14   Number of Visits 17   Date for PT Re-Evaluation 12/19/16   Authorization Type G-CODE AND PROGRESS NOTE EVERY 10TH VISIT. Medicare and BCBS 2nd   PT Start Time 1330  pt late   PT Stop Time 1355  d/c   PT Time Calculation (min) 25 min   Equipment Utilized During Treatment Gait belt   Activity Tolerance Patient limited by fatigue;No increased pain   Behavior During Therapy WFL for tasks assessed/performed      Past Medical History:  Diagnosis Date  . Acute blood loss anemia   . CHF (congestive heart failure) (HCC)    EF 63-78%, grade 1 diastolic dysfunction per echo 04/2015  . 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 CVA (cerebral infarction) 04/2015   Started Plavix 04/2015  . Depression   . Diabetes ([place]) 2016   Type II. On insulin  . Enchondroma of bone 2011   left femur.   . Fracture, intertrochanteric, right femur ([place])   . History of lower GI bleeding   . Hyperlipidemia   . Hypertension   . Patent foramen ovale 04/2015   Started on warfarin 04/2015  . Protein calorie malnutrition ([place])   . Stercoral ulcer of rectum 05/16/2015    Past Surgical History:  Procedure Laterality Date  . CARDIAC SURGERY     Cath without stent  . COLONOSCOPY N/A 05/15/2015   Procedure: COLONOSCOPY;  Surgeon: Mauri Pole, MD;  [facility] END[facility];  Service: Endoscopy;  Laterality: N/A;  . FLEXIBLE SIGMOIDOSCOPY N/A 05/15/2015   Procedure: FLEXIBLE SIGMOIDOSCOPY;  Surgeon: Mauri Pole, MD;  Location: [facility] ENDOSCOPY;  Service: Endoscopy;   Laterality: N/A;  at bedside  . INTRAMEDULLARY (IM) NAIL INTERTROCHANTERIC Right 06/12/2015   Procedure: INTRAMEDULLARY (IM) NAIL RIGHT HIP;  Surgeon: Renette Butters, MD;  Location: [facility];  Service: Orthopedics;  Laterality: Right;  . TEE WITHOUT CARDIOVERSION N/A 05/05/2015   Procedure: TRANSESOPHAGEAL ECHOCARDIOGRAM (TEE);  Surgeon: Dixie Dials, MD;  Location[facility] ENDOSCOPY;  Service: Cardiovascular;  Laterality: N/A;    There were no vitals filed for this visit.      Subjective Assessment - 01/12/17 1330    Subjective Pt denied falls or changes since last visit.    Patient is accompained by: Family member  son   Pertinent History HTN, DM, CHF, CVA, hx of GI bleeds, acute confusional state, aphasia, chronic R shoulder pain (and L shoulder pain starting), anemia, HLD, patent foramen ovale, hx of fall and R hip fracture in 2016   Patient Stated Goals Walk and txf IND or with minor assistance   Currently in Pain? Yes   Pain Score 3    Pain Location Shoulder   Pain Orientation Right   Pain Descriptors / Indicators Aching   Pain Type Chronic pain   Pain Onset More than a month ago   Pain Frequency Intermittent   Aggravating Factors  movement   Pain Relieving Factors rest  [facility] Adult PT Treatment/Exercise - 01/12/17 1358      Transfers   Transfers Sit to Stand;Stand to Sit   Sit to Stand 2: Max assist;With upper extremity assist;From chair/3-in-1  + 2 assist for safety   Sit to Stand Details Verbal cues for technique;Verbal cues for sequencing;Verbal cues for precautions/safety;Manual facilitation for weight shifting;Manual facilitation for placement;Manual facilitation for weight bearing. Performed x2, increased time for all activity 2/2 fatigue and processing.   Sit to Stand Details (indicate cue type and reason) Cues for placement and to scoot towards edge of chair and for ant. weight shifting.   Stand to Sit 3: Mod assist;With upper  extremity assist;To chair/3-in-1   Stand to Sit Details (indicate cue type and reason) Verbal cues for sequencing;Verbal cues for technique;Verbal cues for safe use of DME/AE;Manual facilitation for placement;Manual facilitation for weight bearing;Manual facilitation for weight shifting   Stand to Sit Details Cues to improve hip/knee flexion to sit safely and for hand placement     Ambulation/Gait   Ambulation/Gait Yes   Ambulation/Gait Assistance 4: Min assist;3: Mod assist   Ambulation/Gait Assistance Details 2 person assist to ensure safety, gait with RW and R platform. Cues and facilitation for lateral wt. shifting, sequencing with RW, upright posture, and to advance RLE. Pt required seated rest break after amb.   Ambulation Distance (Feet) 20 Feet   Assistive device Right platform walker   Gait Pattern Step-to pattern;Decreased stride length;Decreased step length - right;Decreased step length - left;Decreased arm swing - right;Decreased arm swing - left;Decreased hip/knee flexion - right;Decreased dorsiflexion - right;Decreased weight shift to left;Decreased weight shift to right;Ataxic;Decreased trunk rotation;Trunk flexed;Narrow base of support;Poor foot clearance - right   Ambulation Surface Level;Indoor                PT Education - 01/12/17 1433    Education provided Yes   Education Details PT discussed goals and d/c. PT discussed the importance of continuing HEP.   Person(s) Educated Patient;Child(ren)   Methods Explanation   Comprehension Verbalized understanding;Need further instruction          PT Short Term Goals - 11/16/16 1622      PT SHORT TERM GOAL #1   Title Pt will be perform HEP with assist from son to improve balance, flexibility and strength. TARGET DATE FOR ALL STGS: 11/17/16   Status Partially Met     PT SHORT TERM GOAL #2   Title Pt will perform STS txfs with mod A in order to improve functional mobility and decr. caregiver burdern.   Status  Partially Met     PT SHORT TERM GOAL #3   Title Attempt amb. as tolerated and appropriate.   Status Achieved           PT Long Term Goals - 01/12/17 1437      PT LONG TERM GOAL #1   Title Pt and pt's son will verbalize CVA risk factors and s/s in order to reduce risk of additional CVA. TARGET DATE FOR ALL LTGS: 12/15/16   Status Achieved     PT LONG TERM GOAL #2   Title Pt will perform STS txfs with min A to decr. caregiver burden and improve functional mobility.    Status Not Met     PT LONG TERM GOAL #3   Title Pt will be able to stand for 5 minutes while perform dynamic balance activities with min A in order to perform ADLs safely, with caregiver.    Status  Not Met     PT LONG TERM GOAL #4   Title Pt will improve SIS: mobility score to >/=37.2 to improve quality of life.    Status Not Met     PT LONG TERM GOAL #5   Title Pt will amb. 66' with LRAD and 1 person mod A to improve functional mobility.    Status Not Met               Plan - 02-07-2017 1434    Clinical Impression Statement Pt met LTG 1, and did not meet LTGs 2-5. This is likely due to inactivity over the last 3 weeks, per pt's son. Pt has maximized functional gains in PT, and is no longer progressing. Therefore, PT is discharging pt. Please see d/c summary for details.    Clinical Impairments Affecting Rehab Potential CVA was in 2016 and it is unclear how much pt was actually ambulating with HHPT.   PT Treatment/Interventions ADLs/Self Care Home Management;Biofeedback;Neuromuscular re-education;Balance training;Therapeutic exercise;Therapeutic activities;Functional mobility training;Gait training;Patient/family education;Orthotic Fit/Training;Vestibular   Consulted and Agree with Plan of Care Patient   Family Member Consulted son-Terry      Patient will benefit from skilled therapeutic intervention in order to improve the following deficits and impairments:  Decreased endurance, Pain, Impaired tone,  Decreased balance, Decreased mobility, Difficulty walking, Impaired flexibility, Postural dysfunction, Decreased coordination, Decreased knowledge of use of DME, Decreased strength, Impaired UE functional use  Visit Diagnosis: Spastic hemiplegia of right dominant side as late effect of other cerebrovascular disease ([place]) - Plan: PT plan of care cert/re-cert       G-Codes - Feb 07, 2017 1438    Functional Assessment Tool Used (Outpatient Only) SIS: 16.7%   Functional Limitation Self care   Self Care Current Status (O8325) At least 80 percent but less than 100 percent impaired, limited or restricted   Self Care Goal Status (Q9826) At least 60 percent but less than 80 percent impaired, limited or restricted   Self Care Discharge Status [phone]) At least 80 percent but less than 100 percent impaired, limited or restricted      Problem List Patient Active Problem List   Diagnosis Date Noted  . Muscle spasticity 01/20/2016  . Bilateral lower extremity edema 12/23/2015  . Acute confusional state 10/28/2015  . Right spastic hemiparesis ([place]) 10/07/2015  . Right shoulder pain 07/24/2015  . Fracture, intertrochanteric, right femur ([place]) 06/10/2015  . Stercoral ulcer of rectum 05/16/2015  . GI bleeding 05/15/2015  . GI bleed 05/13/2015  . BRBPR (bright red blood per rectum) 05/13/2015  . CHF (congestive heart failure) ([place])   . Diabetes ([place])   . Hypertension   . History of stroke   . Lower GI bleed   . Left-sided cerebrovascular accident (CVA) ([place]) 04/28/2015    Cayden Rautio L 2017-02-07, 2:41 PM  [facility] [address] [place], [place], [address] Phone: [phone]   Fax:  [phone]  Name: [name] MRN: [id number] Date of Birth: 1946-03-23  PHYSICAL THERAPY DISCHARGE SUMMARY  Visits from Start of Care: 14  Current functional level related to goals / functional outcomes:     PT Long Term Goals -  02/07/17 1437      PT LONG TERM GOAL #1   Title Pt and pt's son will verbalize CVA risk factors and s/s in order to reduce risk of additional CVA. TARGET DATE FOR ALL LTGS: 12/15/16   Status Achieved     PT LONG TERM GOAL #  2   Title Pt will perform STS txfs with min A to decr. caregiver burden and improve functional mobility.    Status Not Met     PT LONG TERM GOAL #3   Title Pt will be able to stand for 5 minutes while perform dynamic balance activities with min A in order to perform ADLs safely, with caregiver.    Status Not Met     PT LONG TERM GOAL #4   Title Pt will improve SIS: mobility score to >/=37.2 to improve quality of life.    Status Not Met     PT LONG TERM GOAL #5   Title Pt will amb. 97' with LRAD and 1 person mod A to improve functional mobility.    Status Not Met        Remaining deficits: R sided weakness, decr. flexilibility and strength, impaired balance   Education / Equipment: HEP  Plan: Patient agrees to discharge.  Patient goals were not met. Patient is being discharged due to lack of progress.  ?????        Geoffry Paradise, PT,DPT 01/12/17 2:42 PM Phone: [phone] Fax: [phone]

## 2017-01-12 NOTE — Therapy (Signed)
[facility] [address] [place], [place], [address] Phone: [phone]   Fax:  [phone]  Occupational Therapy Treatment  Patient Details  Name: [name] MRN: [id number] Date of Birth: 01-25-1946 Referring Provider: Dr.Yan  Encounter Date: 01/12/2017      OT End of Session - 01/12/17 1708    Visit Number 10   Number of Visits 16   Date for OT Re-Evaluation 01/25/17   Authorization Type medicare, BCBS as secondary.  Pt will need G code and PN every 10th visit   Authorization Time Period 60 days   Authorization - Visit Number 10   Authorization - Number of Visits 20   OT Start Time 1401   OT Stop Time 1443   OT Time Calculation (min) 42 min   Activity Tolerance Patient tolerated treatment well      Past Medical History:  Diagnosis Date  . Acute blood loss anemia   . CHF (congestive heart failure) (HCC)    EF 74-08%, grade 1 diastolic dysfunction per echo 04/2015  . 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 CVA (cerebral infarction) 04/2015   Started Plavix 04/2015  . Depression   . Diabetes ([place]) 2016   Type II. On insulin  . Enchondroma of bone 2011   left femur.   . Fracture, intertrochanteric, right femur ([place])   . History of lower GI bleeding   . Hyperlipidemia   . Hypertension   . Patent foramen ovale 04/2015   Started on warfarin 04/2015  . Protein calorie malnutrition ([place])   . Stercoral ulcer of rectum 05/16/2015    Past Surgical History:  Procedure Laterality Date  . CARDIAC SURGERY     Cath without stent  . COLONOSCOPY N/A 05/15/2015   Procedure: COLONOSCOPY;  Surgeon: Mauri Pole, MD;  Location: [facility] ENDOSCOPY;  Service: Endoscopy;  Laterality: N/A;  . FLEXIBLE SIGMOIDOSCOPY N/A 05/15/2015   Procedure: FLEXIBLE SIGMOIDOSCOPY;  Surgeon: Mauri Pole, MD;  Location: [facility] ENDOSCOPY;  Service: Endoscopy;  Laterality:  N/A;  at bedside  . INTRAMEDULLARY (IM) NAIL INTERTROCHANTERIC Right 06/12/2015   Procedure: INTRAMEDULLARY (IM) NAIL RIGHT HIP;  Surgeon: Renette Butters, MD;  Location: [facility];  Service: Orthopedics;  Laterality: Right;  . TEE WITHOUT CARDIOVERSION N/A 05/05/2015   Procedure: TRANSESOPHAGEAL ECHOCARDIOGRAM (TEE);  Surgeon: Dixie Dials, MD;  Location: [facility] ENDOSCOPY;  Service: Cardiovascular;  Laterality: N/A;    There were no vitals filed for this visit.      Subjective Assessment - 01/12/17 1401    Subjective  I bought my mom a cushion but I didn't know I needed to bring it.   Patient is accompained by: Family member  son   Pertinent History see epic pt s/p L CVA   Patient Stated Goals pt unable to state due to aphasia can answer yes and no   Currently in Pain? Yes   Pain Score 3    Pain Location Shoulder   Pain Orientation Right   Pain Descriptors / Indicators Aching   Pain Type Chronic pain   Pain Onset More than a month ago   Pain Frequency Intermittent   Aggravating Factors  movement   Pain Relieving Factors rest   Multiple Pain Sites No                      OT Treatments/Exercises (OP) - 01/12/17 1648      ADLs  ADL Comments Pt's son has purchased cushion for wheelchair however arrived without cushion and in loaner wheelchair. Explained to son that pt will need both items in order to address remaining goal of toilet transfers. Pt also c/o of signficant pain and tightness in R shoulder today and son reports he has been away on business and that pt has not done anything while he was away. Explained to pt's son again the importance of consistency with HEP in order to decrease pain and prevent further contracture. Pt today with edema and pain in R hand as well.      Neurological Re-education Exercises   Other Exercises 1 Neuro re ed to address grasp and release, foward flexion, neutral rotation and decrease of IR and abduction.  Pt tolerated very slow movements  and required max facilitation and max cues for participation.      Manual Therapy   Manual Therapy Joint mobilization;Soft tissue mobilization;Scapular mobilization;Edema management   Manual therapy comments joint, soft tissue and scap mob to R shoulder to reduce pain, decrease tightness and improve range of motion. Pt required encouragement and had increased pain today. Also perforrmed edema mgmt to R hand with good results followed by active use of hand for grasp and release. Reinforced iimportance of this with pt and son who verbalized understanding.                  OT Short Term Goals - 01/12/17 1706      OT SHORT TERM GOAL #1   Title Pt and family will be mod I with HEP- 12/28/2016 (pt has only attended OT for 4 visits since eval therefore STG's will assume LTG goal date of 7/65/4650 and will recertify today   Status Achieved     OT SHORT TERM GOAL #2   Title Pt will report pain in RUE no greater than 8/10 with PROM home program    Status Achieved  5/10     OT SHORT TERM GOAL #3   Title Pt will require mod a for sit to stand/ stand to sit for toilet transfers and LB self care.    Status Achieved  from elevated surface     OT SHORT TERM GOAL #4   Title Pt will require no more than supervision for static standing balance with 1 UE support for pre ADL function.     Status Achieved           OT Long Term Goals - 01/12/17 1706      OT LONG TERM GOAL #1   Title Pt and family will be mod I with upgraded HEP prn- 01/25/2017   Status On-going     OT LONG TERM GOAL #2   Title Pt will rate pain in RUE no greater than 4/10 with home program and with movement to arm during ADL activity   Status On-going     OT LONG TERM GOAL #3   Title Pt will be min a for toilet transfers.   Status On-going     OT LONG TERM GOAL #4   Title Pt will be able to maintain standing balance during simple ADL activity at sink without UE support and with supervision   Status Partially Met   with 1 UE support                Plan - 01/12/17 1706    Clinical Impression Statement Pt with increased pain in RUE today due to non compliance with HEP.  Son reports  that pt has not been moving around very much at home either.    Rehab Potential Fair   Current Impairments/barriers affecting progress: given severity of deficits as well as number of deficits, length since onset of stroke, pain   OT Frequency 2x / week   OT Duration 8 weeks   OT Treatment/Interventions Self-care/ADL training;Electrical Stimulation;Moist Heat;Ultrasound;Therapeutic exercise;Neuromuscular education;DME and/or AE instruction;Passive range of motion;Manual Therapy;Functional Mobility Training;Splinting;Therapeutic activities;Visual/perceptual remediation/compensation;Patient/family education;Balance training   Plan if cushion present, practice toilet transfers.     Consulted and Agree with Plan of Care Patient;Family member/caregiver   Family Member Consulted son      Patient will benefit from skilled therapeutic intervention in order to improve the following deficits and impairments:  Abnormal gait, Decreased activity tolerance, Decreased balance, Decreased cognition, Decreased range of motion, Decreased mobility, Decreased knowledge of use of DME, Difficulty walking, Impaired UE functional use, Impaired tone, Pain  Visit Diagnosis: Spastic hemiplegia of right dominant side as late effect of other cerebrovascular disease (HCC)  Unsteadiness on feet  Abnormal posture  Pain in right arm  Pain in right hand  Frontal lobe and executive function deficit      G-Codes - 02/05/17 1708    Functional Assessment Tool Used (Outpatient only) skilled clinical observation pt unable to do standardized tests   Functional Limitation Self care   Self Care Current Status (S4967) At least 60 percent but less than 80 percent impaired, limited or restricted   Self Care Goal Status (R9163) At least 40 percent but  less than 60 percent impaired, limited or restricted      Problem List Patient Active Problem List   Diagnosis Date Noted  . Muscle spasticity 01/20/2016  . Bilateral lower extremity edema 12/23/2015  . Acute confusional state 10/28/2015  . Right spastic hemiparesis ([place]) 10/07/2015  . Right shoulder pain 07/24/2015  . Fracture, intertrochanteric, right femur ([place]) 06/10/2015  . Stercoral ulcer of rectum 05/16/2015  . GI bleeding 05/15/2015  . GI bleed 05/13/2015  . BRBPR (bright red blood per rectum) 05/13/2015  . CHF (congestive heart failure) ([place])   . Diabetes ([place])   . Hypertension   . History of stroke   . Lower GI bleed   . Left-sided cerebrovascular accident (CVA) ([place]) 04/28/2015    Quay Burow, OTR/L 2017-02-05, 5:09 PM  [facility] [address] [place], [place], [address] Phone: [phone]   Fax:  [phone]  Name: [name] MRN: [id number] Date of Birth: [birth year]

## 2017-01-13 ENCOUNTER — Encounter: Payer: Self-pay | Admitting: Occupational Therapy

## 2017-01-13 ENCOUNTER — Ambulatory Visit: Payer: Medicare Other | Admitting: Occupational Therapy

## 2017-01-13 DIAGNOSIS — R2681 Unsteadiness on feet: Secondary | ICD-10-CM | POA: Diagnosis not present

## 2017-01-13 DIAGNOSIS — M79641 Pain in right hand: Secondary | ICD-10-CM

## 2017-01-13 DIAGNOSIS — R41844 Frontal lobe and executive function deficit: Secondary | ICD-10-CM

## 2017-01-13 DIAGNOSIS — I69851 Hemiplegia and hemiparesis following other cerebrovascular disease affecting right dominant side: Secondary | ICD-10-CM

## 2017-01-13 DIAGNOSIS — M79601 Pain in right arm: Secondary | ICD-10-CM

## 2017-01-13 DIAGNOSIS — R293 Abnormal posture: Secondary | ICD-10-CM

## 2017-01-13 DIAGNOSIS — I639 Cerebral infarction, unspecified: Secondary | ICD-10-CM | POA: Diagnosis not present

## 2017-01-13 NOTE — Therapy (Addendum)
[facility] [address] [place], [place], [address] Phone: [phone]   Fax:  [phone]  Occupational Therapy Treatment  Patient Details  Name: [name] MRN: [id number] Date of Birth: 12-31-1945 Referring Provider: Dr.Yan  Encounter Date: 01/13/2017      OT End of Session - 01/13/17 1555    Visit Number 11   Number of Visits 16   Date for OT Re-Evaluation 01/25/17   Authorization Type medicare, BCBS as secondary.  Pt will need G code and PN every 10th visit   Authorization Time Period 60 days   Authorization - Visit Number 11   Authorization - Number of Visits 20   OT Start Time 7416  pt arrived late   OT Stop Time 1444   OT Time Calculation (min) 31 min   Activity Tolerance Patient tolerated treatment well      Past Medical History:  Diagnosis Date  . Acute blood loss anemia   . CHF (congestive heart failure) (HCC)    EF 38-45%, grade 1 diastolic dysfunction per echo 04/2015  . 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 CVA (cerebral infarction) 04/2015   Started Plavix 04/2015  . Depression   . Diabetes ([place]) 2016   Type II. On insulin  . Enchondroma of bone 2011   left femur.   . Fracture, intertrochanteric, right femur ([place])   . History of lower GI bleeding   . Hyperlipidemia   . Hypertension   . Patent foramen ovale 04/2015   Started on warfarin 04/2015  . Protein calorie malnutrition ([place])   . Stercoral ulcer of rectum 05/16/2015    Past Surgical History:  Procedure Laterality Date  . CARDIAC SURGERY     Cath without stent  . COLONOSCOPY N/A 05/15/2015   Procedure: COLONOSCOPY;  Surgeon: Mauri Pole, MD;  Location: [facility] ENDOSCOPY;  Service: Endoscopy;  Laterality: N/A;  . FLEXIBLE SIGMOIDOSCOPY N/A 05/15/2015   Procedure: FLEXIBLE SIGMOIDOSCOPY;  Surgeon: Mauri Pole, MD;  Location: [facility] ENDOSCOPY;  Service:  Endoscopy;  Laterality: N/A;  at bedside  . INTRAMEDULLARY (IM) NAIL INTERTROCHANTERIC Right 06/12/2015   Procedure: INTRAMEDULLARY (IM) NAIL RIGHT HIP;  Surgeon: Renette Butters, MD;  Location: [facility];  Service: Orthopedics;  Laterality: Right;  . TEE WITHOUT CARDIOVERSION N/A 05/05/2015   Procedure: TRANSESOPHAGEAL ECHOCARDIOGRAM (TEE);  Surgeon: Dixie Dials, MD;  Location[facility] END[facility]COPY;  Service: Cardiovascular;  Laterality: N/A;    There were no vitals filed for this visit.      Subjective Assessment - 01/13/17 1416    Subjective  I don't know (when asked where her arm rests were for her wheelchair)   Patient is accompained by: Family member  son   Pertinent History see epic pt s/p L CVA   Patient Stated Goals pt unable to state due to aphasia can answer yes and no   Currently in Pain? Yes   Pain Score 3   at rest increases with movement   Pain Location Shoulder   Pain Orientation Right   Pain Descriptors / Indicators Aching   Pain Type Chronic pain   Pain Onset More than a month ago   Pain Frequency Constant   Aggravating Factors  movement   Pain Relieving Factors rest                      OT Treatments/Exercises (OP) - 01/13/17 0001  ADLs   Functional Mobility Pt arrived with son today with her own wheelchair and cushion however pt had no arm rests on the wheelchair. Discussed with son that if pt is going to do active sit to stand instead of son pulling pt into standing, pt will need arm rests to push from. Used clinic wheelchair and demonstrated sit to stand and stand step transfers using standard walker to commode seat. Son then returned demonstrated.  Son stated "at home my mom can stand for a long time with  the walker and no hands on. I can get into her standing pretty easy."  Reiniforced the need for the pt to complete as much of the sit to stand as possible in order for pt to be as active as possible in functional mobility. Son verbalized  understanding.  Pt requires max cues and mod a for sit to stand in the clinic however son reports that when at home and in the context of the activity pt is more active.                  OT Education - 01/13/17 1552    Education provided Yes   Education Details stand step toilet transfers with RW, recommended solid seat for under cushion due to signficant sling in pt's wheelchair seat.    Person(s) Educated Patient;Child(ren)   Methods Explanation;Demonstration;Verbal cues   Comprehension Verbalized understanding;Returned demonstration  son          OT Short Term Goals - 01/13/17 1553      OT SHORT TERM GOAL #1   Title Pt and family will be mod I with HEP- 12/28/2016 (pt has only attended OT for 4 visits since eval therefore STG's will assume LTG goal date of 6/62/9476 and will recertify today   Status Achieved     OT SHORT TERM GOAL #2   Title Pt will report pain in RUE no greater than 8/10 with PROM home program    Status Achieved  5/10     OT SHORT TERM GOAL #3   Title Pt will require mod a for sit to stand/ stand to sit for toilet transfers and LB self care.    Status Achieved  from elevated surface     OT SHORT TERM GOAL #4   Title Pt will require no more than supervision for static standing balance with 1 UE support for pre ADL function.     Status Achieved           OT Long Term Goals - 01/13/17 1553      OT LONG TERM GOAL #1   Title Pt and family will be mod I with upgraded HEP prn- 01/25/2017   Status On-going     OT LONG TERM GOAL #2   Title Pt will rate pain in RUE no greater than 4/10 with home program and with movement to arm during ADL activity   Status Partially Met  pt meets this goal when pt is consistently completing HEP.     OT LONG TERM GOAL #3   Title Pt will be min a for toilet transfers.   Status Not Met  mod assist in clinic however son reports pt is more active in transfer at home     OT [place] #4   Title Pt will be able  to maintain standing balance during simple ADL activity at sink without UE support and with supervision   Status Partially Met  with 1 UE support  Plan - 01/13/17 1554    Clinical Impression Statement Pt has met or partially met all but one goal. Pt is ready for d/c- pt and son in agreement.    Rehab Potential Fair   Current Impairments/barriers affecting progress: given severity of deficits as well as number of deficits, length since onset of stroke, pain   OT Frequency 2x / week   OT Duration 8 weeks   OT Treatment/Interventions Self-care/ADL training;Electrical Stimulation;Moist Heat;Ultrasound;Therapeutic exercise;Neuromuscular education;DME and/or AE instruction;Passive range of motion;Manual Therapy;Functional Mobility Training;Splinting;Therapeutic activities;Visual/perceptual remediation/compensation;Patient/family education;Balance training   Plan d/c from OT   Consulted and Agree with Plan of Care Patient;Family member/caregiver   Family Member Consulted son      Patient will benefit from skilled therapeutic intervention in order to improve the following deficits and impairments:  Abnormal gait, Decreased activity tolerance, Decreased balance, Decreased cognition, Decreased range of motion, Decreased mobility, Decreased knowledge of use of DME, Difficulty walking, Impaired UE functional use, Impaired tone, Pain  Visit Diagnosis: Spastic hemiplegia of right dominant side as late effect of other cerebrovascular disease (HCC)  Unsteadiness on feet  Abnormal posture  Pain in right arm  Pain in right hand  Frontal lobe and executive function deficit      G-Codes - 01-21-2017 1708    Functional Assessment Tool Used (Outpatient only) skilled clinical observation pt unable to do standardized tests   Functional Limitation Self care   Self Care Current Status (K0881) At least 60 percent but less than 80 percent impaired, limited or restricted   Self Care  Goal Status (J0315) At least 40 percent but less than 60 percent impaired, limited or restricted      Problem List Patient Active Problem List   Diagnosis Date Noted  . Muscle spasticity 01/20/2016  . Bilateral lower extremity edema 12/23/2015  . Acute confusional state 10/28/2015  . Right spastic hemiparesis ([place]) 10/07/2015  . Right shoulder pain 07/24/2015  . Fracture, intertrochanteric, right femur ([place]) 06/10/2015  . Stercoral ulcer of rectum 05/16/2015  . GI bleeding 05/15/2015  . GI bleed 05/13/2015  . BRBPR (bright red blood per rectum) 05/13/2015  . CHF (congestive heart failure) ([place])   . Diabetes ([place])   . Hypertension   . History of stroke   . Lower GI bleed   . Left-sided cerebrovascular accident (CVA) ([place]) 04/28/2015   OCCUPATIONAL THERAPY DISCHARGE SUMMARY  Visits from Start of Care: 11  Current functional level related to goals / functional outcomes:s See above   Remaining deficits: Spastic hemiplegia, cognitive, sensory and perceptual deficits, language deficits, pain   Education / Equipment: HEP Plan: Patient agrees to discharge.  Patient goals were met. Patient is being discharged due to meeting the stated rehab goals.  ?????      Quay Burow, OTR/L 01/13/2017, 3:57 PM  [facility] [address] [place], [place], [address] Phone: [phone]   Fax:  [phone]  Name: [name] MRN: [id number] Date of Birth: 04/03/1946

## 2017-01-18 ENCOUNTER — Encounter: Payer: Medicare Other | Admitting: Occupational Therapy

## 2017-01-20 ENCOUNTER — Encounter: Payer: Medicare Other | Admitting: Occupational Therapy

## 2017-02-01 ENCOUNTER — Ambulatory Visit: Payer: Medicare Other | Admitting: Physical Medicine & Rehabilitation

## 2017-02-02 ENCOUNTER — Encounter: Payer: Self-pay | Admitting: Neurology

## 2017-02-02 ENCOUNTER — Telehealth: Payer: Self-pay | Admitting: Neurology

## 2017-02-02 ENCOUNTER — Ambulatory Visit (INDEPENDENT_AMBULATORY_CARE_PROVIDER_SITE_OTHER): Payer: Medicare Other | Admitting: Neurology

## 2017-02-02 ENCOUNTER — Encounter (INDEPENDENT_AMBULATORY_CARE_PROVIDER_SITE_OTHER): Payer: Self-pay

## 2017-02-02 VITALS — BP 114/80 | HR 72

## 2017-02-02 DIAGNOSIS — G8111 Spastic hemiplegia affecting right dominant side: Secondary | ICD-10-CM

## 2017-02-02 MED ORDER — INCOBOTULINUMTOXINA 100 UNITS IM SOLR
600.0000 [IU] | INTRAMUSCULAR | Status: DC
  Administered 2017-02-02: 600 [IU] via INTRAMUSCULAR

## 2017-02-02 NOTE — Progress Notes (Signed)
Chief Complaint  Patient presents with  . Right spastic hemiparesis    Xeomin 100 units x 3 vials - office supply      PATIENT: [name] DOB: 10-Dec-1945   HISTORICAL  [name] distant years old right-handed female, follow-up her most recent stroke in April 28 2015  She had a history of hypertension, diabetes, hyperlipidemia, not on any anticoagulation treatment prior to hospital admission.  In April 27 2016, she presenting with acute onset slurred speech, right leg and arm weakness, I personally reviewed MRI of the brain showed evidence of left ACA infarction, with left A2 occlusion,  She was found to have patent foramina ovale by TEE in October 2016, was put on Coumadin as stroke prevention  She also suffered right hip fracture require surgery in June 12 2015, she is now at [facility], will be back home with her son in next few days  She also complains of few days' history of severe right shoulder pain, right shoulder tightness, limited range of motion. I reviewed laboratory, LDL 121, A1c 11.6,  Repeat Lab in 12 2016, Hg 9.5, A1c 7.0.  UPDATE October 28 2015: Patient was taken to the emergency room in October 24 2015, she was noted to have increased confusion, facial drooping, increased right shoulder pain, her son [name] who brought her to clinic today, stated when she has severe right shoulder pain, she often become mild confused, usually improved after she taking baclofen, gabapentin tramadol, but the medications over and put her into sleep, sometimes she has intermittent muscle spasm at right arm, right leg,  I personally reviewed CAT scan of the brain without contrast October 24 2015, evidence of left ACA territory stroke at the left frontal lobe, there is no acute lesion  I reviewed laboratory evaluation, normal CMP with exception of elevated glucose 290, normal CBC, INR was 2.5, troponin was negative  Before stroke, she was highly functional," 100%", now she  is no longer ambulatory, complains constant right shoulder pain, has no meaningful movement of right arm or leg, significant aphasia  X-ray of her right shoulder in January 2017 showed no significant abnormality  UPDATE October 07 2016: She lost follow-up since last visit in April 2017, she is brought in by her son [name] at today's clinical visit, she continued to complains of right shoulder pain, limited range of motion of right shoulder, is no longer ambulatory, she has memory loss, but overall stable, able to carry on normal conversation, able to feed herself,  She was given a dose of tramadol 50 mg, gabapentin 300 mg, and baclofen 10 mg prior to her office visit to relax her right shoulder, during the conversation, she was noted to have transient confusion, could not carry on the conversation, then followed by a more lucid period of time.  I am concerned that the observe episode could due to partial seizure. Her son did not notice any seizure-like episode at home.  Previous EEG in May 2017 showed mild background slowing, there was no evidence of epileptiform discharge.  I reviewed laboratory evaluation in 2017 A1c 8.4, frequent UTIs, INR 1.33-2.5, she supposed to take Coumadin, CMP showed elevated glucose 194, creatinine 0.91,  UPDATE October 27 2016: This is her first EMG guided botulism toxin injection for spastic right hemiparesis.  UPDATE February 02 2017: She responded very well to previous xeomin injection, a week after botulism toxin injection she had improvement, she was able to participating occupational therapy, had better range of motion  of her right shoulder  REVIEW OF SYSTEMS: Full 14 system review of systems performed and notable only for right shoulder pain  ALLERGIES: Allergies  Allergen Reactions  . Pork-Derived Products Other (See Comments)    To keep blood pressure down    HOME MEDICATIONS: Current Outpatient Prescriptions  Medication Sig Dispense Refill  . atorvastatin  (LIPITOR) 40 MG tablet Take 1 tablet (40 mg total) by mouth daily. 30 tablet 3  . baclofen (LIORESAL) 10 MG tablet TAKE ONE TABLET BY MOUTH THREE TIMES DAILY 90 tablet 11  . gabapentin (NEURONTIN) 300 MG capsule TAKE ONE CAPSULE BY MOUTH THREE TIMES DAILY 90 capsule 11  . insulin glargine (LANTUS) 100 UNIT/ML injection Inject 12 Units into the skin at bedtime.     . metFORMIN (GLUCOPHAGE) 500 MG tablet Take 500 mg by mouth 2 (two) times daily with a meal.    . metoprolol succinate (TOPROL-XL) 25 MG 24 hr tablet Take 25 mg by mouth daily. Hold for SBP <110 or HR <60    . nitroGLYCERIN (NITROSTAT) 0.4 MG SL tablet Place 0.4 mg under the tongue every 5 (five) minutes as needed for chest pain (up to 3 doses.  If no relief call MD/NP).     Marland Kitchen sulfamethoxazole-trimethoprim (BACTRIM DS,SEPTRA DS) 800-160 MG tablet Take 1 tablet by mouth 2 (two) times daily. 6 tablet 0  . traMADol (ULTRAM) 50 MG tablet Take 1-2 tablets (50-100 mg total) by mouth every 8 (eight) hours as needed for moderate pain or severe pain. 180 tablet 2  . warfarin (COUMADIN) 5 MG tablet Take 5 mg by mouth daily.      Current Facility-Administered Medications  Medication Dose Route Frequency Provider Last Rate Last Dose  . incobotulinumtoxinA (XEOMIN) 100 units injection 300 Units  300 Units Intramuscular Q90 days Melissa Pacas, MD   300 Units at 10/27/16 1755    PAST MEDICAL HISTORY: Past Medical History:  Diagnosis Date  . Acute blood loss anemia   . CHF (congestive heart failure) (HCC)    EF 15-40%, grade 1 diastolic dysfunction per echo 04/2015  . 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 CVA (cerebral infarction) 04/2015   Started Plavix 04/2015  . Depression   . Diabetes ([place]) 2016   Type II. On insulin  . Enchondroma of bone 2011   left femur.   . Fracture, intertrochanteric, right femur ([place])   . History of lower GI bleeding   . Hyperlipidemia   . Hypertension     . Patent foramen ovale 04/2015   Started on warfarin 04/2015  . Protein calorie malnutrition ([place])   . Stercoral ulcer of rectum 05/16/2015    PAST SURGICAL HISTORY: Past Surgical History:  Procedure Laterality Date  . CARDIAC SURGERY     Cath without stent  . COLONOSCOPY N/A 05/15/2015   Procedure: COLONOSCOPY;  Surgeon: Mauri Pole, MD;  [facility] ENDOSCOPY;  Service: Endoscopy;  Laterality: N/A;  . FLEXIBLE SIGMOIDOSCOPY N/A 05/15/2015   Procedure: FLEXIBLE SIGMOIDOSCOPY;  Surgeon: Mauri Pole, MD;  Location: [facility] ENDOSCOPY;  Service: Endoscopy;  Laterality: N/A;  at bedside  . INTRAMEDULLARY (IM) NAIL INTERTROCHANTERIC Right 06/12/2015   Procedure: INTRAMEDULLARY (IM) NAIL RIGHT HIP;  Surgeon: Renette Butters, MD;  Location: [facility];  Service: Orthopedics;  Laterality: Right;  . TEE WITHOUT CARDIOVERSION N/A 05/05/2015   Procedure: TRANSESOPHAGEAL ECHOCARDIOGRAM (TEE);  Surgeon: Dixie Dials, MD;  Location: [facility];  Service: Cardiovascular;  Laterality:  N/A;    FAMILY HISTORY: Family History  Problem Relation Age of Onset  . Hypertension Mother   . Heart failure Mother   . Hyperlipidemia Mother   . Heart attack Father     SOCIAL HISTORY:  Social History   Social History  . Marital status: Married    Spouse name: N/A  . Number of children: 6  . Years of education: 63   Occupational History  . Retired    Social History Main Topics  . Smoking status: Former Smoker    Types: Cigarettes    Quit date: 04/05/2015  . Smokeless tobacco: Never Used     Comment: Quit 04/28/15  . Alcohol use No  . Drug use: No  . Sexual activity: Not on file   Other Topics Concern  . Not on file   Social History Narrative   She will be living with her two sons.\   Right-handed.   No caffeine use.     PHYSICAL EXAM   Vitals:   02/02/17 1408  BP: 114/80  Pulse: 72    Not recorded      There is no height or weight on file to calculate BMI.  PHYSICAL  EXAMNIATION:  Gen: NAD, conversant, well nourised, obese, well groomed                     Cardiovascular: Regular rate rhythm, no peripheral edema, warm, nontender. Eyes: Conjunctivae clear without exudates or hemorrhage Neck: Supple, no carotid bruise. Pulmonary: Clear to auscultation bilaterally   NEUROLOGICAL EXAM:  MENTAL STATUS: Speech/cognition: She has expressive aphasia, following commands, able to onset question by yes or no, not intelligible speech,   CRANIAL NERVES: CN II: Right visual field deficit on double spontaneous stimulation, Pupils are round equal and briskly reactive to light. CN III, IV, VI: extraocular movement are normal. No ptosis. CN V: Facial sensation is intact to pinprick in all 3 divisions bilaterally. Corneal responses are intact.  CN VII: Face is symmetric with normal eye closure and smile. CN VIII: Hearing is normal to rubbing fingers CN IX, X: Palate elevates symmetrically. Phonation is normal. CN XI: Head turning and shoulder shrug are intact CN XII: Tongue is midline with normal movements and no atrophy.  MOTOR: She only has trace movement of right lower extremity, complains of significant right shoulder pain, limited range of motion of right shoulder, right proximal upper extremity motor strength was 3 and distal strength was 4  REFLEXES: Hyperreflexia on the right arm, right leg, plantar responses are flexor bilaterally  SENSORY: Intact to light touch, pinprick and vibratory sensation  COORDINATION: Rapid alternating movements and fine finger movements are intact. There is no dysmetria on finger-to-nose and heel-knee-shin.    GAIT/STANCE: She has difficulty bearing weight even with assistant   DIAGNOSTIC DATA (LABS, IMAGING, TESTING) - I reviewed patient records, labs, notes, testing and imaging myself where available.   ASSESSMENT AND PLAN  AZAYLEA MAVES is a 71 y.o. female   Acute onset of confusion  She has evidence of left  frontal encephalomalacia, at the high risk for developing complex partial seizure  EEG in April 2018 showed generalized slowing no epileptiform discharge  Keep Depakote ER 500mg  qhs.   Vitamin D deficiency  Continue vitamin D supplement   Left ACA stroke, left A2 occlusion with residual spastic right hemiparesis, right shoulder pain  She has evidence of patent foraminal ovale is taking Coumadin   EMG guided xeomin injection for spastic  right upper extremity, right shoulder pain, initially injection was on October 27 2016  Under EMG guidance, we used 300 units of xeomin,(100 units/2 cc of normal saline)  Right pectoralis major 25 units x4 =100 units Right teres major 25 units x2= 50 units. Right Latissimus dorsi 25 unitsx2= 50 units  Right brachialis 50 units Right pronator teres 50 units  She will return to clinic in 3 months for repeat injections.  Will increase  xeomin to 500 units at next injection    [name], M.D. Ph.D.  [facility] [address] [place], [place] [address] Ph: [phone] Fax: [phone]  CC: Referring Provider

## 2017-02-02 NOTE — Telephone Encounter (Signed)
Increase xeomin to 500 units at her next injection

## 2017-02-02 NOTE — Progress Notes (Signed)
**  Xeomin 100 units x 3 vials, [employer]DC 0865-7846-96, Lot [id number], Exp 03/2019, office supply.//mck,rn**

## 2017-02-04 NOTE — Telephone Encounter (Signed)
Noted, thank you

## 2017-02-10 ENCOUNTER — Encounter: Payer: Self-pay | Admitting: Family

## 2017-02-10 ENCOUNTER — Ambulatory Visit (INDEPENDENT_AMBULATORY_CARE_PROVIDER_SITE_OTHER): Payer: Medicare Other | Admitting: Family

## 2017-02-10 VITALS — BP 140/82 | HR 75 | Temp 98.7°F | Resp 16 | Ht 66.0 in

## 2017-02-10 DIAGNOSIS — L97411 Non-pressure chronic ulcer of right heel and midfoot limited to breakdown of skin: Secondary | ICD-10-CM | POA: Insufficient documentation

## 2017-02-10 DIAGNOSIS — I639 Cerebral infarction, unspecified: Secondary | ICD-10-CM | POA: Diagnosis not present

## 2017-02-10 NOTE — Patient Instructions (Addendum)
Thank you for choosing [facility].  SUMMARY AND INSTRUCTIONS:  Cleanse with soap and water. No alcohol or peroxide.   Keep pressure off the right heel.  Follow up with podiatry.   Follow up:  If your symptoms worsen or fail to improve, please contact our office for further instruction, or in case of emergency go directly to the emergency room at the closest medical facility.     Pressure Injury A pressure injury, sometimes called a bedsore, is an injury to the skin and underlying tissue caused by pressure. Pressure on blood vessels causes decreased blood flow to the skin, which can eventually cause the skin tissue to die and break down into a wound. Pressure injuries usually occur:  Over bony parts of the body such as the tailbone, shoulders, elbows, hips, and heels.  Under medical devices such as respiratory equipment, stockings, tubes, and splints.  Pressure injuries start as reddened areas on the skin and can lead to pain, muscle damage, and infection. Pressure injuries can vary in severity. What are the causes? Pressure injuries are caused by a lack of blood supply to an area of skin. They can occur from intense pressure over a short period of time or from less intense pressure over a long period of time. What increases the risk? This condition is more likely to develop in people who:  Are in the hospital or an extended care facility.  Are bedridden or in a wheelchair.  Have an injury or disease that keeps them from: ? Moving normally. ? Feeling pain or pressure.  Have a condition that: ? Makes them sleepy or less alert. ? Causes poor blood flow.  Need to wear a medical device.  Have poor control of their bladder or bowel functions (incontinence).  Have poor nutrition (malnutrition).  Are of certain ethnicities. People of African American and Latino or Hispanic descent are at higher risk compared to other ethnic groups.  If you are at risk for pressure  ulcers, your health care provider may recommend certain types of bedding to help prevent them. These may include foam or gel mattresses covered with one of the following:  A sheepskin blanket.  A pad that is filled with gel, air, water, or foam.  What are the signs or symptoms? The main symptom is a blister or change in skin color that opens into a wound. Other symptoms include:  Red or dark areas of skin that do not turn white or pale when pressed with a finger.  Pain, warmth, or change of skin texture.  How is this diagnosed? This condition is diagnosed with a medical history and physical exam. You may also have tests, including:  Blood tests to check for infection or signs of poor nutrition.  Imaging studies to check for damage to the deep tissues under your skin.  Blood flow studies.  Your pressure injury will be staged to determine its severity. Staging is an assessment of:  The depth of the pressure injury.  Which tissues are exposed because of the pressure injury.  The causes of the pressure injury.  How is this treated? The main focus of treatment is to help your injury heal. This may be done by:  Relieving or redistributing pressure on your skin. This includes: ? Frequently changing your position. ? Eliminating or minimizing positions that caused the wound or that can make the wound worse. ? Using specific bed mattresses and chair cushions. ? Refitting, resizing, or replacing any medical devices, or padding the  skin under them. ? Using creams or powders to prevent rubbing (friction) on the skin.  Keeping your skin clean and dry. This may include using a skin cleanser or skin protectant as told by your health care provider. This may be a lotion, ointment, or spray.  Cleaning your injury and removing any dead tissue from the wound (debridement).  Placing a bandage (dressing) over your injury.  Preventing or treating infection. This may include antibiotic,  antimicrobial, or antiseptic medicines.  Treatment may also include medicine for pain. Sometimes surgery is needed to close the wound with a flap of healthy skin or a piece of skin from another area of your body (graft). You may need surgery if other treatments are not working or if your injury is very deep. Follow these instructions at home: Wound care  Follow instructions from your health care provider about: ? How to take care of your wound. ? When and how you should change your dressing. ? When you should remove your dressing. If your dressing is dry and stuck when you try to remove it, moisten or wet the dressing with saline or water so that it can be removed without harming your skin or wound tissue.  Check your wound every day for signs of infection. Have a caregiver do this for you if you are not able. Watch for: ? More redness, swelling, or pain. ? More fluid, blood, or pus. ? A bad smell. Skin Care  Keep your skin clean and dry. Gently pat your skin dry.  Do not rub or massage your skin.  Use a skin protectant only as told by your health care provider.  Check your skin every day for any changes in color or any new blisters or sores (ulcers). Have a caregiver do this for you if you are not able. Medicines  Take over-the-counter and prescription medicines only as told by your health care provider.  If you were prescribed an antibiotic medicine, take it or apply it as told by your health care provider. Do not stop taking or using the antibiotic even if your condition improves. Reducing and Redistributing Pressure  Do not lie or sit in one position for a long time. Move or change position every two hours or as told by your health care provider.  Use pillows or cushions to reduce pressure. Ask your health care provider to recommend cushions or pads for you.  Use medical devices that do not rub your skin. Tell your health care provider if one of your medical devices is causing  a pressure injury to develop. General instructions   Eat a healthy diet that includes lots of protein. Ask your health care provider for diet advice.  Drink enough fluid to keep your urine clear or pale yellow.  Be as active as you can every day. Ask your health care provider to suggest safe exercises or activities.  Do not abuse drugs or alcohol.  Keep all follow-up visits as told by your health care provider. This is important.  Do not smoke. Contact a health care provider if:   You have chills or fever.  Your pain medicine is not helping.  You have any changes in skin color.  You have new blisters or sores.  You develop warmth, redness, or swelling near a pressure injury.  You have a bad odor or pus coming from your pressure injury.  You lose control of your bowels or bladder.  You develop new symptoms.  Your wound does not improve  after 1-2 weeks of treatment.  You develop a new medical condition, such as diabetes, peripheral vascular disease, or conditions that affect your defense (immune) system. This information is not intended to replace advice given to you by your health care provider. Make sure you discuss any questions you have with your health care provider. Document Released: 06/21/2005 Document Revised: 11/24/2015 Document Reviewed: 10/30/2014 Elsevier Interactive Patient Education  [employer].

## 2017-02-10 NOTE — Progress Notes (Signed)
Subjective:    Patient ID: [name], female    DOB: 05-Jan-1946, 71 y.o.   MRN: [id number]  Chief Complaint  Patient presents with  . Open Wound    has had a place on the heel of her right for 2 months that has turned into an open wound this week    HPI:  [name] is a 71 y.o. female who  has a past medical history of Acute blood loss anemia; CHF (congestive heart failure) ([place]); Coronary artery disease involving native artery of transplanted heart without angina pectoris; CVA (cerebral infarction) (04/2015); Depression; Diabetes ([place]) (2016); Enchondroma of bone (2011); Fracture, intertrochanteric, right femur ([place]); History of lower GI bleeding; Hyperlipidemia; Hypertension; Patent foramen ovale (04/2015); Protein calorie malnutrition ([place]); and Stercoral ulcer of rectum (05/16/2015). and presents today for an office visit.   This is new problem. Associated symptom of a wound located on her right heel that started off as an irritation about 2 months ago has recently become an open wound this week. No fevers. Has been seen by a podiatrist in the past with concern for pressure on her heel.  Modifying factors include cleansing with alcohol, peroxide, and Neosporin. Currently covered with a bandage.                        Allergies  Allergen Reactions  . Pork-Derived Products Other (See Comments)    To keep blood pressure down      Outpatient Medications Prior to Visit  Medication Sig Dispense Refill  . atorvastatin (LIPITOR) 40 MG tablet Take 1 tablet (40 mg total) by mouth daily. 30 tablet 3  . baclofen (LIORESAL) 10 MG tablet TAKE ONE TABLET BY MOUTH THREE TIMES DAILY 90 tablet 11  . gabapentin (NEURONTIN) 300 MG capsule TAKE ONE CAPSULE BY MOUTH THREE TIMES DAILY 90 capsule 11  . insulin glargine (LANTUS) 100 UNIT/ML injection Inject 12 Units into the skin at bedtime.     . metFORMIN (GLUCOPHAGE) 500 MG tablet Take 500 mg by mouth 2 (two) times daily with a meal.    .  metoprolol succinate (TOPROL-XL) 25 MG 24 hr tablet Take 25 mg by mouth daily. Hold for SBP <110 or HR <60    . nitroGLYCERIN (NITROSTAT) 0.4 MG SL tablet Place 0.4 mg under the tongue every 5 (five) minutes as needed for chest pain (up to 3 doses.  If no relief call MD/NP).     Marland Kitchen sulfamethoxazole-trimethoprim (BACTRIM DS,SEPTRA DS) 800-160 MG tablet Take 1 tablet by mouth 2 (two) times daily. 6 tablet 0  . traMADol (ULTRAM) 50 MG tablet Take 1-2 tablets (50-100 mg total) by mouth every 8 (eight) hours as needed for moderate pain or severe pain. 180 tablet 2  . warfarin (COUMADIN) 5 MG tablet Take 5 mg by mouth daily.      Facility-Administered Medications Prior to Visit  Medication Dose Route Frequency Provider Last Rate Last Dose  . incobotulinumtoxinA (XEOMIN) 100 units injection 300 Units  300 Units Intramuscular Q90 days Marcial Pacas, MD   300 Units at 10/27/16 1755  . incobotulinumtoxinA (XEOMIN) 100 units injection 600 Units  600 Units Intramuscular Q90 days Marcial Pacas, MD   600 Units at 02/02/17 1440      Past Surgical History:  Procedure Laterality Date  . CARDIAC SURGERY     Cath without stent  . COLONOSCOPY N/A 05/15/2015   Procedure: COLONOSCOPY;  Surgeon: Mauri Pole, MD;  Location:  [facility];  Service: Endoscopy;  Laterality: N/A;  . FLEXIBLE SIGMOIDOSCOPY N/A 05/15/2015   Procedure: FLEXIBLE SIGMOIDOSCOPY;  Surgeon: Mauri Pole, MD;  Location: [facility];  Service: Endoscopy;  Laterality: N/A;  at bedside  . INTRAMEDULLARY (IM) NAIL INTERTROCHANTERIC Right 06/12/2015   Procedure: INTRAMEDULLARY (IM) NAIL RIGHT HIP;  Surgeon: Renette Butters, MD;  Location: [facility];  Service: Orthopedics;  Laterality: Right;  . TEE WITHOUT CARDIOVERSION N/A 05/05/2015   Procedure: TRANSESOPHAGEAL ECHOCARDIOGRAM (TEE);  Surgeon: Dixie Dials, MD;  Location: [facility] ENDOSCOPY;  Service: Cardiovascular;  Laterality: N/A;      Past Medical History:  Diagnosis Date  . Acute blood  loss anemia   . CHF (congestive heart failure) (HCC)    EF 68-12%, grade 1 diastolic dysfunction per echo 04/2015  . 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 CVA (cerebral infarction) 04/2015   Started Plavix 04/2015  . Depression   . Diabetes ([place]) 2016   Type II. On insulin  . Enchondroma of bone 2011   left femur.   . Fracture, intertrochanteric, right femur ([place])   . History of lower GI bleeding   . Hyperlipidemia   . Hypertension   . Patent foramen ovale 04/2015   Started on warfarin 04/2015  . Protein calorie malnutrition ([place])   . Stercoral ulcer of rectum 05/16/2015      Review of Systems  Constitutional: Negative for chills and fever.  Cardiovascular: Positive for leg swelling. Negative for chest pain and palpitations.  Skin: Positive for wound.  Neurological: Positive for numbness. Negative for weakness.      Objective:    BP 140/82 (BP Location: Left Arm, Patient Position: Sitting, Cuff Size: Normal)   Pulse 75   Temp 98.7 F (37.1 C) (Oral)   Resp 16   Ht 5\' 6"  (1.676 m)   SpO2 97%  Nursing note and vital signs reviewed.  Physical Exam  Constitutional: She is oriented to person, place, and time. She appears well-developed and well-nourished. No distress.  Cardiovascular: Normal rate, regular rhythm, normal heart sounds and intact distal pulses.   Pulmonary/Chest: Effort normal and breath sounds normal. No respiratory distress. She has no wheezes. She has no rales. She exhibits no tenderness.  Neurological: She is alert and oriented to person, place, and time.  Skin: Skin is warm and dry.  1 x 1.5 cm wound located on the lateral side of the calcaneus with callus formation surrounding a broken layer of skin in the middle.  Psychiatric: She has a normal mood and affect. Her behavior is normal. Judgment and thought content normal.       Assessment & Plan:   Problem List Items Addressed This Visit        Musculoskeletal and Integument   Skin ulcer of right heel, limited to breakdown of skin ([place]) - Primary    Skin ulcer of the right heel most likely related to pressure with skin breakdown noted. Continue basic wound care with soap and water and cover with a bandage. She is at significantly increased risk for poor healing secondary to diabetes and poor circulation. Encouraged protein intake. Follow up with podiatry for continued debridement and treatment. Advised to reduce pressure on the heel through elevation of the legs and floating the heels.           I am having Ms. Wyszynski maintain her nitroGLYCERIN, atorvastatin, warfarin, insulin glargine, metoprolol succinate, metFORMIN, traMADol, sulfamethoxazole-trimethoprim, baclofen, and gabapentin. We  will continue to administer incobotulinumtoxinA and incobotulinumtoxinA.   Follow-up: Return in about 1 month (around 03/13/2017), or if symptoms worsen or fail to improve.  Mauricio Po, FNP

## 2017-02-10 NOTE — Assessment & Plan Note (Signed)
Skin ulcer of the right heel most likely related to pressure with skin breakdown noted. Continue basic wound care with soap and water and cover with a bandage. She is at significantly increased risk for poor healing secondary to diabetes and poor circulation. Encouraged protein intake. Follow up with podiatry for continued debridement and treatment. Advised to reduce pressure on the heel through elevation of the legs and floating the heels.

## 2017-02-14 ENCOUNTER — Telehealth: Payer: Self-pay | Admitting: Family

## 2017-02-14 DIAGNOSIS — R350 Frequency of micturition: Secondary | ICD-10-CM

## 2017-02-14 NOTE — Telephone Encounter (Signed)
Pt's son called stating that he thinks that his mother has a UTI. Previously, he was given a collection container so that the pt could collect the sample at home and he was able to bring it to the lab to be tested. He wanted to know if this could be done again for him because it is hard to get her in. Please advise. The pt's son [name]) can be reached at [phone].

## 2017-02-15 NOTE — Telephone Encounter (Signed)
UA and urine culture orders placed.

## 2017-02-15 NOTE — Telephone Encounter (Signed)
Notified patient.  Son will come to pick up cup

## 2017-02-16 ENCOUNTER — Ambulatory Visit: Payer: BC Managed Care – PPO | Admitting: Podiatry

## 2017-02-16 ENCOUNTER — Other Ambulatory Visit (INDEPENDENT_AMBULATORY_CARE_PROVIDER_SITE_OTHER): Payer: Medicare Other

## 2017-02-16 DIAGNOSIS — R350 Frequency of micturition: Secondary | ICD-10-CM | POA: Diagnosis not present

## 2017-02-16 LAB — URINALYSIS, ROUTINE W REFLEX MICROSCOPIC
BILIRUBIN URINE: NEGATIVE
Hgb urine dipstick: NEGATIVE
KETONES UR: NEGATIVE
NITRITE: NEGATIVE
PH: 5.5 (ref 5.0–8.0)
RBC / HPF: NONE SEEN (ref 0–?)
Specific Gravity, Urine: 1.015 (ref 1.000–1.030)
TOTAL PROTEIN, URINE-UPE24: NEGATIVE
Urine Glucose: >=1000 — AB
Urobilinogen, UA: 0.2 (ref 0.0–1.0)

## 2017-02-18 LAB — URINE CULTURE

## 2017-02-20 ENCOUNTER — Other Ambulatory Visit: Payer: Self-pay | Admitting: Family

## 2017-02-20 MED ORDER — NITROFURANTOIN MONOHYD MACRO 100 MG PO CAPS: 100.0000 mg | ORAL_CAPSULE | Freq: Two times a day (BID) | ORAL | 0 refills | Status: DC

## 2017-03-01 ENCOUNTER — Ambulatory Visit: Payer: Medicare Other | Admitting: Physical Medicine & Rehabilitation

## 2017-03-01 ENCOUNTER — Encounter: Payer: Medicare Other | Attending: Physical Medicine & Rehabilitation

## 2017-03-16 ENCOUNTER — Ambulatory Visit (HOSPITAL_BASED_OUTPATIENT_CLINIC_OR_DEPARTMENT_OTHER): Payer: Medicare Other

## 2017-03-23 ENCOUNTER — Encounter: Payer: Self-pay | Admitting: Podiatry

## 2017-03-23 ENCOUNTER — Ambulatory Visit (INDEPENDENT_AMBULATORY_CARE_PROVIDER_SITE_OTHER): Payer: Medicare Other | Admitting: Podiatry

## 2017-03-23 DIAGNOSIS — M79609 Pain in unspecified limb: Secondary | ICD-10-CM

## 2017-03-23 DIAGNOSIS — B351 Tinea unguium: Secondary | ICD-10-CM

## 2017-03-23 NOTE — Progress Notes (Signed)
Complaint:  Visit Type: Patient returns to my office for continued preventative foot care services. Complaint: Patient states" my nails have grown long and thick and become painful to walk and wear shoes" Patient has been diagnosed with DM with no foot complications. The patient presents for preventative foot care services. No changes to ROS.  Patient says the heel pillow has helped.  Podiatric Exam: Vascular: dorsalis pedis and posterior tibial pulses are palpable bilateral. Capillary return is immediate. Temperature gradient is WNL. Skin turgor WNL  Sensorium: Diminished  Semmes Weinstein monofilament test. Normal tactile sensation bilaterally. Nail Exam: Pt has thick disfigured discolored nails with subungual debris noted bilateral entire nail hallux through fifth toenails Ulcer Exam: There is no evidence of ulcer or pre-ulcerative changes or infection. Orthopedic Exam: Muscle tone and strength are WNL. No limitations in general ROM. No crepitus or effusions noted. Foot type and digits show no abnormalities. Bony prominences are unremarkable. Skin: No Porokeratosis. No infection or ulcers.  Skin necrosis with peeling left heel.  Diagnosis:  Onychomycosis, , Pain in right toe, pain in left toes  Treatment & Plan Procedures and Treatment: Consent by patient was obtained for treatment procedures. The patient understood the discussion of treatment and procedures well. All questions were answered thoroughly reviewed. Debridement of mycotic and hypertrophic toenails, 1 through 5 bilateral and clearing of subungual debris. No ulceration, no infection noted. Return Visit-Office Procedure: Patient instructed to return to the office for a follow up visit 3 months for continued evaluation and treatment.    Gardiner Barefoot DPM

## 2017-04-13 DIAGNOSIS — F17290 Nicotine dependence, other tobacco product, uncomplicated: Secondary | ICD-10-CM | POA: Diagnosis not present

## 2017-04-13 DIAGNOSIS — E119 Type 2 diabetes mellitus without complications: Secondary | ICD-10-CM | POA: Diagnosis not present

## 2017-04-13 DIAGNOSIS — E785 Hyperlipidemia, unspecified: Secondary | ICD-10-CM | POA: Diagnosis not present

## 2017-04-13 DIAGNOSIS — I2510 Atherosclerotic heart disease of native coronary artery without angina pectoris: Secondary | ICD-10-CM | POA: Diagnosis not present

## 2017-04-13 DIAGNOSIS — I10 Essential (primary) hypertension: Secondary | ICD-10-CM | POA: Diagnosis not present

## 2017-04-13 DIAGNOSIS — I639 Cerebral infarction, unspecified: Secondary | ICD-10-CM | POA: Diagnosis not present

## 2017-04-18 DIAGNOSIS — I639 Cerebral infarction, unspecified: Secondary | ICD-10-CM | POA: Diagnosis not present

## 2017-04-20 ENCOUNTER — Ambulatory Visit (INDEPENDENT_AMBULATORY_CARE_PROVIDER_SITE_OTHER): Payer: Medicare Other | Admitting: Family Medicine

## 2017-04-20 ENCOUNTER — Ambulatory Visit (INDEPENDENT_AMBULATORY_CARE_PROVIDER_SITE_OTHER)
Admission: RE | Admit: 2017-04-20 | Discharge: 2017-04-20 | Disposition: A | Discharge status: Home / Self Care | Payer: Medicare Other | Source: Ambulatory Visit | Attending: Family Medicine | Admitting: Family Medicine

## 2017-04-20 ENCOUNTER — Ambulatory Visit: Payer: Medicare Other | Admitting: Family Medicine

## 2017-04-20 ENCOUNTER — Encounter: Payer: Self-pay | Admitting: Family Medicine

## 2017-04-20 VITALS — BP 94/70 | HR 75 | Temp 97.8°F | Ht 66.0 in

## 2017-04-20 DIAGNOSIS — Z23 Encounter for immunization: Secondary | ICD-10-CM

## 2017-04-20 DIAGNOSIS — K5900 Constipation, unspecified: Secondary | ICD-10-CM

## 2017-04-20 MED ORDER — SENNOSIDES-DOCUSATE SODIUM 8.6-50 MG PO TABS: 2.0000 | ORAL_TABLET | Freq: Two times a day (BID) | ORAL | 0 refills | Status: DC

## 2017-04-20 NOTE — Assessment & Plan Note (Signed)
This appears to be acute on chronic in nature. Has not had any pain. Still having flatus so no obstruction - senokot provided - If no bowel movement by Friday then may need to have a disimpaction. - Advised to stay well-hydrated and continue the fiber supplements. - Could consider referral to GI.

## 2017-04-20 NOTE — Progress Notes (Signed)
[name] - 71 y.o. female MRN [id number]  Date of birth: April 22, 1946  SUBJECTIVE:  Including CC & ROS.  Chief Complaint  Patient presents with  . Constipation    son states patient has had one bowel movement in 18 days. has tried prune juice and miralax. son states patient has oatmeal everymorning with the prune juice along with high fiber meals.     [name] is a 71 year old female that is presenting with constipation. She has had a history of constipation and has been taking MiraLAX and prune juice. She usually has a bowel movement of these once a day. Has only had 2 bowel movements in the past 18 days. The bowel movement was normal in appearance. Denies any abdominal pain. Has not had any abdominal distention. Denies nay nausea or vomiting.   Has a history of a rectal ulcer that had a be surgically repaired and has not had any blood from her rectum since that time. This occurred in 2016. Denies any history of colonoscopy other than this.  Review of Systems  Constitutional: Negative for fever.  Gastrointestinal: Positive for constipation. Negative for abdominal distention, abdominal pain, nausea and vomiting.  Skin: Negative for color change.  Neurological: Positive for facial asymmetry, speech difficulty and weakness.    HISTORY: Past Medical, Surgical, Social, and Family History Reviewed & Updated per EMR.   Pertinent Historical Findings include:  Past Medical History:  Diagnosis Date  . Acute blood loss anemia   . CHF (congestive heart failure) (HCC)    EF 66-44%, grade 1 diastolic dysfunction per echo 04/2015  . 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 CVA (cerebral infarction) 04/2015   Started Plavix 04/2015  . Depression   . Diabetes ([place]) 2016   Type II. On insulin  . Enchondroma of bone 2011   left femur.   . Fracture, intertrochanteric, right femur ([place])   . History of lower GI bleeding   .  Hyperlipidemia   . Hypertension   . Patent foramen ovale 04/2015   Started on warfarin 04/2015  . Protein calorie malnutrition ([place])   . Stercoral ulcer of rectum 05/16/2015    Past Surgical History:  Procedure Laterality Date  . CARDIAC SURGERY     Cath without stent  . COLONOSCOPY N/A 05/15/2015   Procedure: COLONOSCOPY;  Surgeon: Mauri Pole, MD;  Location[facility] END[facility]COPY;  Service: Endoscopy;  Laterality: N/A;  . FLEXIBLE SIGMOIDOSCOPY N/A 05/15/2015   Procedure: FLEXIBLE SIGMOIDOSCOPY;  Surgeon: Mauri Pole, MD;  Location: [facility] ENDOSCOPY;  Service: Endoscopy;  Laterality: N/A;  at bedside  . INTRAMEDULLARY (IM) NAIL INTERTROCHANTERIC Right 06/12/2015   Procedure: INTRAMEDULLARY (IM) NAIL RIGHT HIP;  Surgeon: Renette Butters, MD;  Location: [facility];  Service: Orthopedics;  Laterality: Right;  . TEE WITHOUT CARDIOVERSION N/A 05/05/2015   Procedure: TRANSESOPHAGEAL ECHOCARDIOGRAM (TEE);  Surgeon: Dixie Dials, MD;  Location: [facility] ENDOSCOPY;  Service: Cardiovascular;  Laterality: N/A;    Allergies  Allergen Reactions  . Pork-Derived Products Other (See Comments)    To keep blood pressure down    Family History  Problem Relation Age of Onset  . Hypertension Mother   . Heart failure Mother   . Hyperlipidemia Mother   . Heart attack Father      Social History   Social History  . Marital status: Married    Spouse name: N/A  . Number of children: 6  . Years of education: 11  Occupational History  . Retired    Social History Main Topics  . Smoking status: Former Smoker    Types: Cigarettes    Quit date: 04/05/2015  . Smokeless tobacco: Never Used     Comment: Quit 04/28/15  . Alcohol use No  . Drug use: No  . Sexual activity: Not on file   Other Topics Concern  . Not on file   Social History Narrative   She will be living with her two sons.\   Right-handed.   No caffeine use.     PHYSICAL EXAM:  VS: BP 94/70 (BP Location: Left Arm, Patient  Position: Sitting, Cuff Size: Normal)   Pulse 75   Temp 97.8 F (36.6 C) (Oral)   Ht 5\' 6"  (1.676 m)   SpO2 98%  Physical Exam Gen: NAD, alert, cooperative with exam,  ENT: normal lips, normal nasal mucosa,  Eye: normal EOM, normal conjunctiva and lids CV:  no edema, +2 pedal pulses   Resp: no accessory muscle use, non-labored,  GI: no masses or tenderness, no hernia  Skin: no rashes, no areas of induration  Psych:  normal insight, alert and oriented MSK: Right-sided hemiparesis, in a wheelchair  Rectal: No internal hemorrhoids appreciated, normal rectal tone, no fecal ball appreciated    ASSESSMENT & PLAN:   Constipation This appears to be acute on chronic in nature. Has not had any pain. Still having flatus so no obstruction - senokot provided - If no bowel movement by Friday then may need to have a disimpaction. - Advised to stay well-hydrated and continue the fiber supplements. - Could consider referral to GI.

## 2017-04-20 NOTE — Patient Instructions (Addendum)
Thank you for coming in,   Please try the medication.   Please let us know if she doesn't have a bowel movement in the next two days.    Please feel free to call with any questions or concerns at any time, at [phone]. --Dr. Raeford Razor

## 2017-05-02 ENCOUNTER — Telehealth: Payer: Self-pay | Admitting: Family

## 2017-05-02 DIAGNOSIS — R300 Dysuria: Secondary | ICD-10-CM

## 2017-05-02 NOTE — Telephone Encounter (Signed)
Pt's son called stating that he thinks the pt ha a UTI. He wanted to know if he could come pick up a "urine sample pack" (sterrile cup and hat) so that he can bring a sample up to be tested. Please advise.

## 2017-05-02 NOTE — Telephone Encounter (Signed)
Can you order this for her? Thanks.

## 2017-05-02 NOTE — Telephone Encounter (Signed)
Dr. Raeford Razor ordered urine culture and urinalysis, Patient's son notified and will get completed.

## 2017-05-03 ENCOUNTER — Other Ambulatory Visit (INDEPENDENT_AMBULATORY_CARE_PROVIDER_SITE_OTHER): Payer: Medicare Other

## 2017-05-03 ENCOUNTER — Telehealth: Payer: Self-pay | Admitting: *Deleted

## 2017-05-03 DIAGNOSIS — R300 Dysuria: Secondary | ICD-10-CM

## 2017-05-03 LAB — URINALYSIS, ROUTINE W REFLEX MICROSCOPIC
Bilirubin Urine: NEGATIVE
KETONES UR: NEGATIVE
NITRITE: NEGATIVE
PH: 5.5 (ref 5.0–8.0)
Total Protein, Urine: NEGATIVE
UROBILINOGEN UA: 0.2 (ref 0.0–1.0)

## 2017-05-03 MED ORDER — CIPROFLOXACIN HCL 500 MG PO TABS: 500.0000 mg | ORAL_TABLET | Freq: Every day | ORAL | 0 refills | Status: DC

## 2017-05-03 NOTE — Telephone Encounter (Signed)
-----   Message from Rosemarie Ax, MD sent at 05/03/2017  4:57 PM EDT ----- Please inform son that I have sent in an antibiotic.

## 2017-05-03 NOTE — Telephone Encounter (Signed)
Son notified as instructed.

## 2017-05-03 NOTE — Progress Notes (Signed)
Will send cipro based on UA. Hx of culture growing klebsiella and sensitive to cipro.   Rosemarie Ax, MD [facility] 05/03/2017, 4:48 PM

## 2017-05-06 LAB — URINE CULTURE
MICRO NUMBER: 81215346
SPECIMEN QUALITY: ADEQUATE

## 2017-05-11 ENCOUNTER — Ambulatory Visit: Payer: BC Managed Care – PPO | Admitting: Neurology

## 2017-05-11 ENCOUNTER — Telehealth: Payer: Self-pay | Admitting: Neurology

## 2017-05-11 NOTE — Telephone Encounter (Signed)
Pt son Tony(on DPR) has called to r/s the injection for pt who can not make appointment today.  Pt son made aware of NoShow policy, please call to r/s

## 2017-05-12 ENCOUNTER — Encounter: Payer: Self-pay | Admitting: Neurology

## 2017-05-12 NOTE — Telephone Encounter (Signed)
I called and spoke with [name] (listed on the [facility]) he stated that he was the one that called yesterday. We rescheduled the apt.

## 2017-06-22 ENCOUNTER — Ambulatory Visit: Payer: BC Managed Care – PPO | Admitting: Podiatry

## 2017-06-30 ENCOUNTER — Telehealth: Payer: Self-pay | Admitting: Neurology

## 2017-06-30 NOTE — Telephone Encounter (Signed)
Pts son called wanting to talk a little more about his mothers upcoming appt. He has a few questions about the injections and would like a call back.

## 2017-06-30 NOTE — Telephone Encounter (Signed)
Left message requesting a return call.

## 2017-06-30 NOTE — Telephone Encounter (Signed)
Spoke to [employer] - he is unsure which specialty pharmacy his mother's Xeomin is shipped from and needs to pay them a co-pay in order for the medication to be sent to our office.  He is asking for a call back with the specialty pharmacy phone number.

## 2017-07-02 IMAGING — CR DG CHEST 1V PORT
1 series · 1 of 1 positions shown · non-contrast
Comparison: 05/01/2015 chest radiograph.

CLINICAL DATA: Full code

EXAM:
PORTABLE CHEST 1 VIEW

[AP]
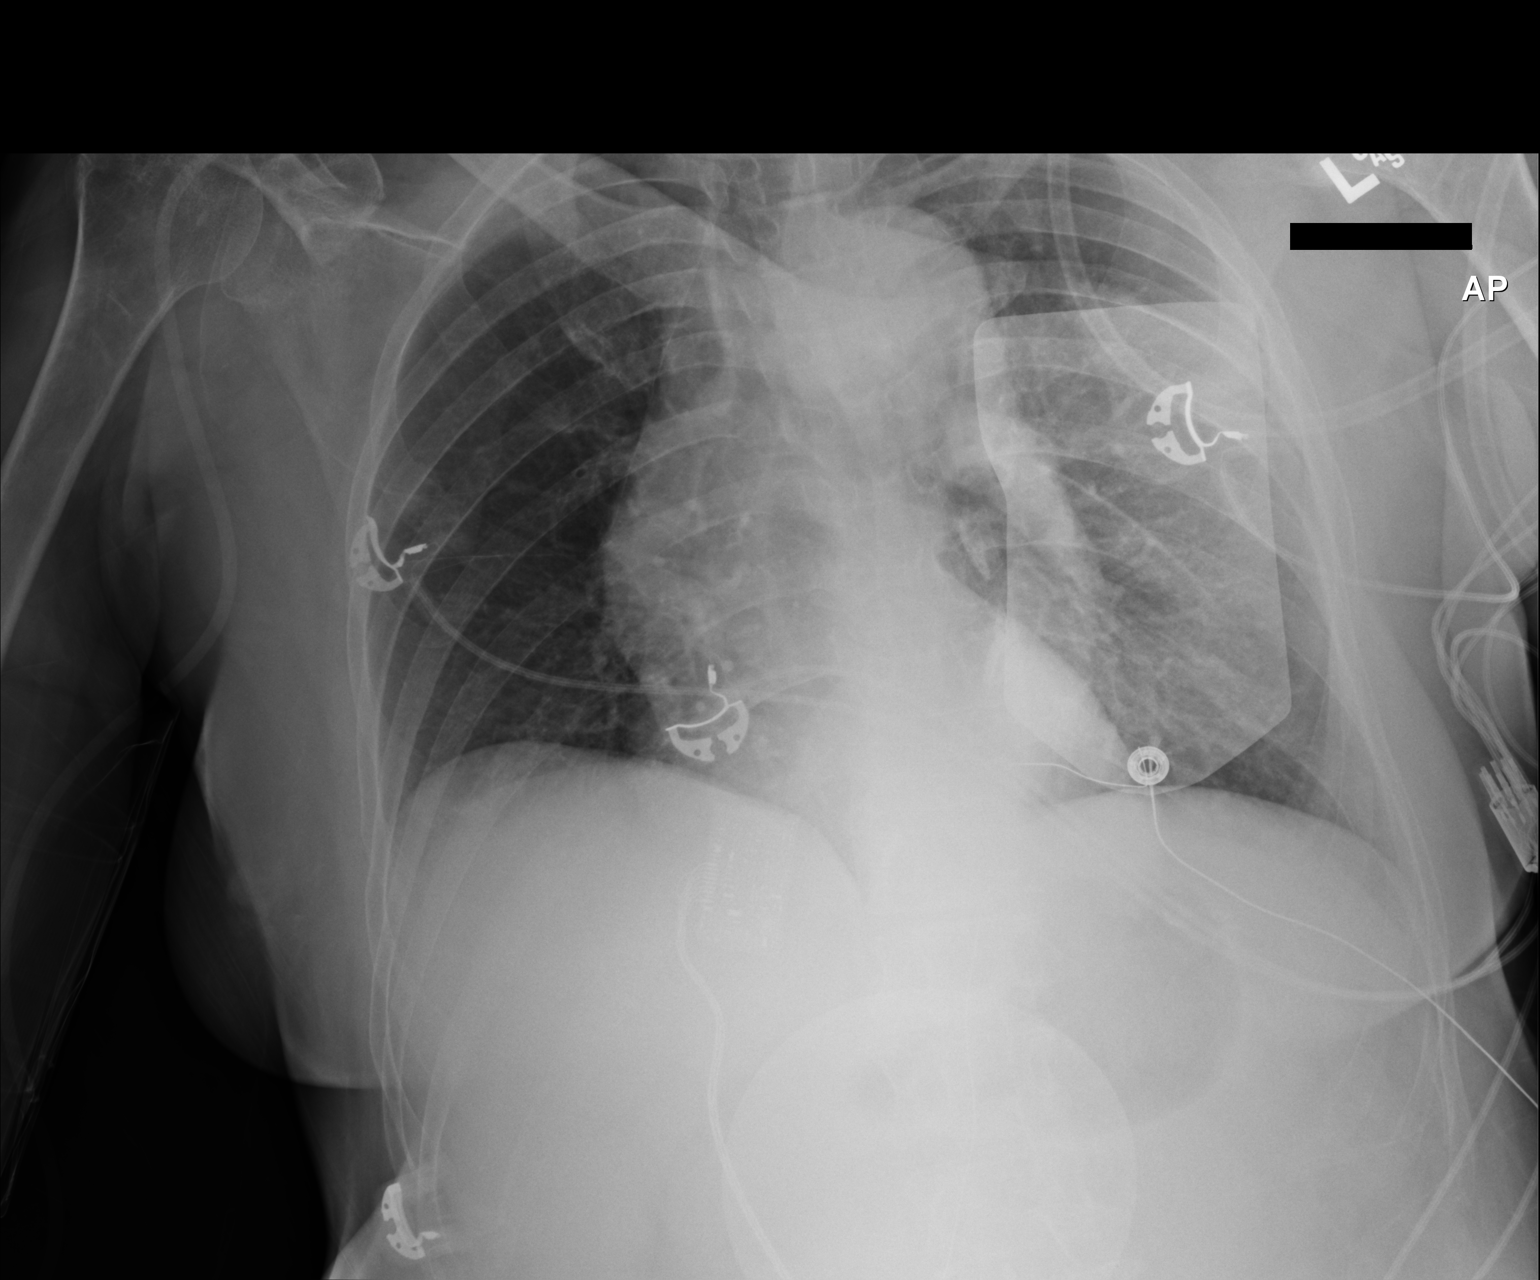

[1 of 1 positions shown; findings below may reference images not displayed]

FINDINGS: Defibrillation pad overlies the left chest. Stable cardiomediastinal
silhouette with normal heart size. No pneumothorax. No pleural
effusion. Clear lungs, with no focal lung consolidation and no
pulmonary edema. No displaced fracture.
IMPRESSION: No active disease.

## 2017-07-07 NOTE — Telephone Encounter (Signed)
I spoke with [name] and informed him that the patient has been B/B with our office since the has medicare. He said that was ok.

## 2017-07-08 ENCOUNTER — Ambulatory Visit: Payer: BC Managed Care – PPO | Admitting: Podiatry

## 2017-07-13 ENCOUNTER — Ambulatory Visit: Payer: BC Managed Care – PPO | Admitting: Neurology

## 2017-07-14 NOTE — Telephone Encounter (Signed)
[name] would like a call back from [name] to discuss a few things in more detail

## 2017-07-20 ENCOUNTER — Encounter: Payer: Self-pay | Admitting: Neurology

## 2017-07-20 ENCOUNTER — Telehealth: Payer: Self-pay | Admitting: Neurology

## 2017-07-20 ENCOUNTER — Ambulatory Visit (INDEPENDENT_AMBULATORY_CARE_PROVIDER_SITE_OTHER): Payer: Medicare Other | Admitting: Neurology

## 2017-07-20 VITALS — BP 114/77 | HR 87 | Ht 66.0 in | Wt 152.0 lb

## 2017-07-20 DIAGNOSIS — G8111 Spastic hemiplegia affecting right dominant side: Secondary | ICD-10-CM

## 2017-07-20 MED ORDER — INCOBOTULINUMTOXINA 100 UNITS IM SOLR
500.0000 [IU] | INTRAMUSCULAR | Status: DC
  Administered 2017-07-20: 500 [IU] via INTRAMUSCULAR

## 2017-07-20 NOTE — Progress Notes (Signed)
Chief Complaint  Patient presents with  . Right Spastic Hemiplegia    Xeomin 100 units x 5 vials - office supply      PATIENT: [name] DOB: 03/22/1946   HISTORICAL  [name] distant years old right-handed female, follow-up her most recent stroke in April 28 2015  She had a history of hypertension, diabetes, hyperlipidemia, not on any anticoagulation treatment prior to hospital admission.  In April 27 2016, she presenting with acute onset slurred speech, right leg and arm weakness, I personally reviewed MRI of the brain showed evidence of left ACA infarction, with left A2 occlusion,  She was found to have patent foramina ovale by TEE in October 2016, was put on Coumadin as stroke prevention  She also suffered right hip fracture require surgery in June 12 2015, she is now at [facility], will be back home with her son in next few days  She also complains of few days' history of severe right shoulder pain, right shoulder tightness, limited range of motion. I reviewed laboratory, LDL 121, A1c 11.6,  Repeat Lab in 12 2016, Hg 9.5, A1c 7.0.  UPDATE October 28 2015: Patient was taken to the emergency room in October 24 2015, she was noted to have increased confusion, facial drooping, increased right shoulder pain, her son [name] who brought her to clinic today, stated when she has severe right shoulder pain, she often become mild confused, usually improved after she taking baclofen, gabapentin tramadol, but the medications over and put her into sleep, sometimes she has intermittent muscle spasm at right arm, right leg,  I personally reviewed CAT scan of the brain without contrast October 24 2015, evidence of left ACA territory stroke at the left frontal lobe, there is no acute lesion  I reviewed laboratory evaluation, normal CMP with exception of elevated glucose 290, normal CBC, INR was 2.5, troponin was negative  Before stroke, she was highly functional," 100%", now she is  no longer ambulatory, complains constant right shoulder pain, has no meaningful movement of right arm or leg, significant aphasia  X-ray of her right shoulder in January 2017 showed no significant abnormality  UPDATE October 07 2016: She lost follow-up since last visit in April 2017, she is brought in by her son [name] at today's clinical visit, she continued to complains of right shoulder pain, limited range of motion of right shoulder, is no longer ambulatory, she has memory loss, but overall stable, able to carry on normal conversation, able to feed herself,  She was given a dose of tramadol 50 mg, gabapentin 300 mg, and baclofen 10 mg prior to her office visit to relax her right shoulder, during the conversation, she was noted to have transient confusion, could not carry on the conversation, then followed by a more lucid period of time.  I am concerned that the observe episode could due to partial seizure. Her son did not notice any seizure-like episode at home.  Previous EEG in May 2017 showed mild background slowing, there was no evidence of epileptiform discharge.  I reviewed laboratory evaluation in 2017 A1c 8.4, frequent UTIs, INR 1.33-2.5, she supposed to take Coumadin, CMP showed elevated glucose 194, creatinine 0.91,  UPDATE October 27 2016: This is her first EMG guided botulism toxin injection for spastic right hemiparesis.  UPDATE February 02 2017: She responded very well to previous xeomin injection, a week after botulism toxin injection she had improvement, she was able to participating occupational therapy, had better range of motion  of her right shoulder  UPDATE Jul 20 2017: She responded very well to previous EMG guided xeomin injection, she can relax her right shoulder, right arm much better, no significant side effect noticed.  REVIEW OF SYSTEMS: Full 14 system review of systems performed and notable only for right shoulder pain  ALLERGIES: Allergies  Allergen Reactions  .  Pork-Derived Products Other (See Comments)    To keep blood pressure down    HOME MEDICATIONS: Current Outpatient Medications  Medication Sig Dispense Refill  . atorvastatin (LIPITOR) 40 MG tablet Take 1 tablet (40 mg total) by mouth daily. 30 tablet 3  . baclofen (LIORESAL) 10 MG tablet TAKE ONE TABLET BY MOUTH THREE TIMES DAILY 90 tablet 11  . ciprofloxacin (CIPRO) 500 MG tablet Take 1 tablet (500 mg total) by mouth daily with breakfast. For 3 days 3 tablet 0  . gabapentin (NEURONTIN) 300 MG capsule TAKE ONE CAPSULE BY MOUTH THREE TIMES DAILY 90 capsule 11  . insulin glargine (LANTUS) 100 UNIT/ML injection Inject 12 Units into the skin at bedtime.     . metFORMIN (GLUCOPHAGE) 500 MG tablet Take 500 mg by mouth 2 (two) times daily with a meal.    . metoprolol succinate (TOPROL-XL) 25 MG 24 hr tablet Take 25 mg by mouth daily. Hold for SBP <110 or HR <60    . nitroGLYCERIN (NITROSTAT) 0.4 MG SL tablet Place 0.4 mg under the tongue every 5 (five) minutes as needed for chest pain (up to 3 doses.  If no relief call MD/NP).     Marland Kitchen senna-docusate (SENOKOT-S) 8.6-50 MG tablet Take 2 tablets by mouth 2 (two) times daily. Until stooling regularly 30 tablet 0  . sulfamethoxazole-trimethoprim (BACTRIM DS,SEPTRA DS) 800-160 MG tablet Take 1 tablet by mouth 2 (two) times daily. 6 tablet 0  . traMADol (ULTRAM) 50 MG tablet Take 1-2 tablets (50-100 mg total) by mouth every 8 (eight) hours as needed for moderate pain or severe pain. 180 tablet 2  . warfarin (COUMADIN) 5 MG tablet Take 5 mg by mouth daily.      No current facility-administered medications for this visit.     PAST MEDICAL HISTORY: Past Medical History:  Diagnosis Date  . Acute blood loss anemia   . CHF (congestive heart failure) (HCC)    EF 50-53%, grade 1 diastolic dysfunction per echo 04/2015  . 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 CVA (cerebral infarction) 04/2015   Started  Plavix 04/2015  . Depression   . Diabetes ([place]) 2016   Type II. On insulin  . Enchondroma of bone 2011   left femur.   . Fracture, intertrochanteric, right femur ([place])   . History of lower GI bleeding   . Hyperlipidemia   . Hypertension   . Patent foramen ovale 04/2015   Started on warfarin 04/2015  . Protein calorie malnutrition ([place])   . Stercoral ulcer of rectum 05/16/2015    PAST SURGICAL HISTORY: Past Surgical History:  Procedure Laterality Date  . CARDIAC SURGERY     Cath without stent  . COLONOSCOPY N/A 05/15/2015   Procedure: COLONOSCOPY;  Surgeon: Mauri Pole, MD;  Location[facility];  Service: Endoscopy;  Laterality: N/A;  . FLEXIBLE SIGMOIDOSCOPY N/A 05/15/2015   Procedure: FLEXIBLE SIGMOIDOSCOPY;  Surgeon: Mauri Pole, MD;  Location: [place] ENDOSCOPY;  Service: Endoscopy;  Laterality: N/A;  at bedside  . INTRAMEDULLARY (IM) NAIL INTERTROCHANTERIC Right 06/12/2015   Procedure: INTRAMEDULLARY (IM) NAIL  RIGHT HIP;  Surgeon: Renette Butters, MD;  Location: [place];  Service: Orthopedics;  Laterality: Right;  . TEE WITHOUT CARDIOVERSION N/A 05/05/2015   Procedure: TRANSESOPHAGEAL ECHOCARDIOGRAM (TEE);  Surgeon: Dixie Dials, MD;  Location: [facility] ENDOSCOPY;  Service: Cardiovascular;  Laterality: N/A;    FAMILY HISTORY: Family History  Problem Relation Age of Onset  . Hypertension Mother   . Heart failure Mother   . Hyperlipidemia Mother   . Heart attack Father     SOCIAL HISTORY:  Social History   Socioeconomic History  . Marital status: Married    Spouse name: Not on file  . Number of children: 6  . Years of education: 20  . Highest education level: Not on file  Social Needs  . Financial resource strain: Not on file  . Food insecurity - worry: Not on file  . Food insecurity - inability: Not on file  . Transportation needs - medical: Not on file  . Transportation needs - non-medical: Not on file  Occupational History  . Occupation: Retired    Tobacco Use  . Smoking status: Former Smoker    Types: Cigarettes    Last attempt to quit: 04/05/2015    Years since quitting: 2.2  . Smokeless tobacco: Never Used  . Tobacco comment: Quit 04/28/15  Substance and Sexual Activity  . Alcohol use: No    Alcohol/week: 0.0 oz  . Drug use: No  . Sexual activity: Not on file  Other Topics Concern  . Not on file  Social History Narrative   She will be living with her two sons.\   Right-handed.   No caffeine use.     PHYSICAL EXAM   Vitals:   07/20/17 1504  BP: 114/77  Pulse: 87  Weight: 152 lb (68.9 kg)  Height: 5\' 6"  (1.676 m)    Not recorded      Body mass index is 24.53 kg/m.  PHYSICAL EXAMNIATION:  Gen: NAD, conversant, well nourised, obese, well groomed                     Cardiovascular: Regular rate rhythm, no peripheral edema, warm, nontender. Eyes: Conjunctivae clear without exudates or hemorrhage Neck: Supple, no carotid bruise. Pulmonary: Clear to auscultation bilaterally   NEUROLOGICAL EXAM:  MENTAL STATUS: Speech/cognition: She has expressive aphasia, following commands, able to onset question by yes or no, not intelligible speech,   CRANIAL NERVES: CN II: Right visual field deficit on double spontaneous stimulation, Pupils are round equal and briskly reactive to light. CN III, IV, VI: extraocular movement are normal. No ptosis. CN V: Facial sensation is intact to pinprick in all 3 divisions bilaterally. Corneal responses are intact.  CN VII: Face is symmetric with normal eye closure and smile. CN VIII: Hearing is normal to rubbing fingers CN IX, X: Palate elevates symmetrically. Phonation is normal. CN XI: Head turning and shoulder shrug are intact CN XII: Tongue is midline with normal movements and no atrophy.  MOTOR: She only has trace movement of right lower extremity, complains of significant right shoulder pain, limited range of motion of right shoulder, right proximal upper extremity motor  strength was 3 and distal strength was 4  REFLEXES: Hyperreflexia on the right arm, right leg, plantar responses are flexor bilaterally  SENSORY: Intact to light touch, pinprick and vibratory sensation  COORDINATION: Rapid alternating movements and fine finger movements are intact. There is no dysmetria on finger-to-nose and heel-knee-shin.    GAIT/STANCE: She has difficulty bearing  weight even with assistant   DIAGNOSTIC DATA (LABS, IMAGING, TESTING) - I reviewed patient records, labs, notes, testing and imaging myself where available.   ASSESSMENT AND PLAN  [name] is a 72 y.o. female   Acute onset of confusion  She has evidence of left frontal encephalomalacia, at the high risk for developing complex partial seizure  EEG in April 2018 showed generalized slowing no epileptiform discharge  Keep Depakote ER 500mg  qhs.   Vitamin D deficiency  Continue vitamin D supplement   Left ACA stroke, left A2 occlusion with residual spastic right hemiparesis, right shoulder pain  She has evidence of patent foraminal ovale is taking Coumadin   EMG guided xeomin injection for spastic right upper extremity, right shoulder pain, initially injection was on October 27 2016  Under EMG guidance, we used 500 units of xeomin,(100 units/2 cc of normal saline)  Right pectoralis major 25 units x4 =100 units Right teres major 25 units x2= 50 units. Right Latissimus dorsi 25 unitsx2= 50 units  Right brachialis 100 units Right pronator teres 100 units  Right flexor digitorum profundus 50 units Right flexor carpi ulnaris 50 units  She will return to clinic in 3 months for repeat injections.  Will increase  xeomin to 500 units at next injection    Marcial Pacas, M.D. Ph.D.  [facility] [address], [place], [place] [address] Ph: [phone] Fax: [phone]  CC: Referring Provider

## 2017-07-20 NOTE — Progress Notes (Signed)
**  Xeomin 100 units x 5 vials, NDC 5947-0761-51, Lot [id number], Exp 08/2019, office supply.//mck,rn**

## 2017-07-20 NOTE — Telephone Encounter (Signed)
Pt. Needs 12wk Xeomin inj apt

## 2017-07-21 NOTE — Telephone Encounter (Signed)
I called to schedule the patient for her next injection but she didn't not answer and the VM was full.

## 2017-07-26 ENCOUNTER — Telehealth: Payer: Self-pay | Admitting: Family Medicine

## 2017-07-26 DIAGNOSIS — R300 Dysuria: Secondary | ICD-10-CM

## 2017-07-26 NOTE — Telephone Encounter (Signed)
Son called in for patient stating that he believes she has a UTI.  Patient has scheduled a transfer appointment from [place] to [place] in February.  States last seen Dr. Raeford Razor.  Wanted to know if Dr. Raeford Razor would enter lab orders so son could bring urine specimen to the office.  States due too patients stroke she is not mobil and states this would help out if it could be done.  Please advise.

## 2017-07-26 NOTE — Telephone Encounter (Signed)
Spoke with son and he thinks his mother may have a UTI. Placed future orders.   Rosemarie Ax, MD [facility] 07/26/2017, 5:17 PM

## 2017-07-28 ENCOUNTER — Other Ambulatory Visit (INDEPENDENT_AMBULATORY_CARE_PROVIDER_SITE_OTHER): Payer: Medicare Other

## 2017-07-28 DIAGNOSIS — R300 Dysuria: Secondary | ICD-10-CM

## 2017-07-28 LAB — URINALYSIS, ROUTINE W REFLEX MICROSCOPIC
Bilirubin Urine: NEGATIVE
KETONES UR: NEGATIVE
NITRITE: NEGATIVE
SPECIFIC GRAVITY, URINE: 1.015 (ref 1.000–1.030)
UROBILINOGEN UA: 0.2 (ref 0.0–1.0)
pH: 5.5 (ref 5.0–8.0)

## 2017-07-29 ENCOUNTER — Telehealth: Payer: Self-pay | Admitting: Family Medicine

## 2017-07-29 DIAGNOSIS — N3001 Acute cystitis with hematuria: Secondary | ICD-10-CM

## 2017-07-29 MED ORDER — CIPROFLOXACIN HCL 500 MG PO TABS: 500.0000 mg | ORAL_TABLET | Freq: Every day | ORAL | 0 refills | Status: DC

## 2017-07-29 NOTE — Telephone Encounter (Signed)
Sent in ABX based on UA. Informed son. Has limited mobility and discussed her resistance to ABX based on urine culture. Will refer to urology to try if a chronic abx may be warranted.   Rosemarie Ax, MD [facility] 07/29/2017, 4:05 PM

## 2017-08-02 LAB — URINE CULTURE
MICRO NUMBER:: 90102532
SPECIMEN QUALITY: ADEQUATE

## 2017-08-19 ENCOUNTER — Encounter: Payer: Self-pay | Admitting: Podiatry

## 2017-08-19 ENCOUNTER — Ambulatory Visit (INDEPENDENT_AMBULATORY_CARE_PROVIDER_SITE_OTHER): Payer: Medicare Other | Admitting: Podiatry

## 2017-08-19 DIAGNOSIS — E1142 Type 2 diabetes mellitus with diabetic polyneuropathy: Secondary | ICD-10-CM

## 2017-08-19 DIAGNOSIS — M79609 Pain in unspecified limb: Secondary | ICD-10-CM | POA: Diagnosis not present

## 2017-08-19 DIAGNOSIS — B351 Tinea unguium: Secondary | ICD-10-CM

## 2017-08-19 DIAGNOSIS — D689 Coagulation defect, unspecified: Secondary | ICD-10-CM

## 2017-08-19 DIAGNOSIS — I639 Cerebral infarction, unspecified: Secondary | ICD-10-CM | POA: Diagnosis not present

## 2017-08-19 NOTE — Progress Notes (Signed)
Complaint:  Visit Type: Patient returns to my office for continued preventative foot care services. Complaint: Patient states" my nails have grown long and thick and become painful to walk and wear shoes" Patient has been diagnosed with DM with no foot complications. The patient presents for preventative foot care services. No changes to ROS.  Patient says the heel pillow has helped. Patient is taking coumadin.  Podiatric Exam: Vascular: dorsalis pedis and posterior tibial pulses are palpable bilateral. Capillary return is immediate. Temperature gradient is WNL. Skin turgor WNL  Sensorium: Diminished  Semmes Weinstein monofilament test. Normal tactile sensation bilaterally. Nail Exam: Pt has thick disfigured discolored nails with subungual debris noted bilateral entire nail hallux through fifth toenails Ulcer Exam: There is no evidence of ulcer or pre-ulcerative changes or infection. Orthopedic Exam: Muscle tone and strength are WNL. No limitations in general ROM. No crepitus or effusions noted. Foot type and digits show no abnormalities. Bony prominences are unremarkable. Skin: No Porokeratosis. No infection or ulcers.  Skin necrosis with peeling left heel.  Diagnosis:  Onychomycosis, , Pain in right toe, pain in left toes  Treatment & Plan Procedures and Treatment: Consent by patient was obtained for treatment procedures. The patient understood the discussion of treatment and procedures well. All questions were answered thoroughly reviewed. Debridement of mycotic and hypertrophic toenails, 1 through 5 bilateral and clearing of subungual debris. No ulceration, no infection noted. Return Visit-Office Procedure: Patient instructed to return to the office for a follow up visit 3 months for continued evaluation and treatment.    Gardiner Barefoot DPM

## 2017-08-23 ENCOUNTER — Ambulatory Visit: Payer: Medicare Other | Admitting: Internal Medicine

## 2017-08-23 DIAGNOSIS — Z0289 Encounter for other administrative examinations: Secondary | ICD-10-CM

## 2017-09-02 DIAGNOSIS — I639 Cerebral infarction, unspecified: Secondary | ICD-10-CM | POA: Diagnosis not present

## 2017-09-02 DIAGNOSIS — I2510 Atherosclerotic heart disease of native coronary artery without angina pectoris: Secondary | ICD-10-CM | POA: Diagnosis not present

## 2017-09-02 DIAGNOSIS — I10 Essential (primary) hypertension: Secondary | ICD-10-CM | POA: Diagnosis not present

## 2017-09-02 DIAGNOSIS — E119 Type 2 diabetes mellitus without complications: Secondary | ICD-10-CM | POA: Diagnosis not present

## 2017-09-02 DIAGNOSIS — E785 Hyperlipidemia, unspecified: Secondary | ICD-10-CM | POA: Diagnosis not present

## 2017-09-02 DIAGNOSIS — F17290 Nicotine dependence, other tobacco product, uncomplicated: Secondary | ICD-10-CM | POA: Diagnosis not present

## 2017-10-07 LAB — HM DIABETES EYE EXAM

## 2017-10-26 ENCOUNTER — Other Ambulatory Visit (INDEPENDENT_AMBULATORY_CARE_PROVIDER_SITE_OTHER): Payer: Medicare Other

## 2017-10-26 ENCOUNTER — Encounter: Payer: Self-pay | Admitting: Internal Medicine

## 2017-10-26 ENCOUNTER — Other Ambulatory Visit: Payer: Self-pay | Admitting: Internal Medicine

## 2017-10-26 ENCOUNTER — Ambulatory Visit (INDEPENDENT_AMBULATORY_CARE_PROVIDER_SITE_OTHER): Payer: Medicare Other | Admitting: Internal Medicine

## 2017-10-26 VITALS — BP 112/78 | Temp 98.0°F | Ht 66.0 in | Wt 156.0 lb

## 2017-10-26 DIAGNOSIS — I639 Cerebral infarction, unspecified: Secondary | ICD-10-CM | POA: Diagnosis not present

## 2017-10-26 DIAGNOSIS — E1159 Type 2 diabetes mellitus with other circulatory complications: Secondary | ICD-10-CM

## 2017-10-26 DIAGNOSIS — Z23 Encounter for immunization: Secondary | ICD-10-CM | POA: Diagnosis not present

## 2017-10-26 DIAGNOSIS — B029 Zoster without complications: Secondary | ICD-10-CM

## 2017-10-26 DIAGNOSIS — E119 Type 2 diabetes mellitus without complications: Secondary | ICD-10-CM

## 2017-10-26 LAB — LIPID PANEL
Cholesterol: 136 mg/dL (ref 0–200)
HDL: 43.2 mg/dL (ref >39.00)
NONHDL: 92.86
Total CHOL/HDL Ratio: 3
Triglycerides: 382 mg/dL — ABNORMAL HIGH (ref 0.0–149.0)
VLDL: 76.4 mg/dL — ABNORMAL HIGH (ref 0.0–40.0)

## 2017-10-26 LAB — CBC WITH DIFFERENTIAL/PLATELET
BASOS ABS: 0 10*3/uL (ref 0.0–0.1)
Basophils Relative: 0.4 % (ref 0.0–3.0)
EOS ABS: 0.1 10*3/uL (ref 0.0–0.7)
Eosinophils Relative: 1.5 % (ref 0.0–5.0)
HEMATOCRIT: 46.7 % — AB (ref 36.0–46.0)
HEMOGLOBIN: 15.4 g/dL — AB (ref 12.0–15.0)
LYMPHS PCT: 28.6 % (ref 12.0–46.0)
Lymphs Abs: 1.8 10*3/uL (ref 0.7–4.0)
MCHC: 33.1 g/dL (ref 30.0–36.0)
MCV: 85.2 fl (ref 78.0–100.0)
MONOS PCT: 10.4 % (ref 3.0–12.0)
Monocytes Absolute: 0.7 10*3/uL (ref 0.1–1.0)
Neutro Abs: 3.8 10*3/uL (ref 1.4–7.7)
Neutrophils Relative %: 59.1 % (ref 43.0–77.0)
Platelets: 176 10*3/uL (ref 150.0–400.0)
RBC: 5.48 Mil/uL — ABNORMAL HIGH (ref 3.87–5.11)
RDW: 13.9 % (ref 11.5–15.5)
WBC: 6.5 10*3/uL (ref 4.0–10.5)

## 2017-10-26 LAB — BASIC METABOLIC PANEL
BUN: 9 mg/dL (ref 6–23)
CO2: 27 mEq/L (ref 19–32)
CREATININE: 0.83 mg/dL (ref 0.40–1.20)
Calcium: 9.4 mg/dL (ref 8.4–10.5)
Chloride: 101 mEq/L (ref 96–112)
GFR: 86.61 mL/min (ref >60.00)
Glucose, Bld: 351 mg/dL — ABNORMAL HIGH (ref 70–99)
Potassium: 4.4 mEq/L (ref 3.5–5.1)
Sodium: 134 mEq/L — ABNORMAL LOW (ref 135–145)

## 2017-10-26 LAB — HEPATIC FUNCTION PANEL
ALBUMIN: 4 g/dL (ref 3.5–5.2)
ALK PHOS: 90 U/L (ref 39–117)
ALT: 22 U/L (ref 0–35)
AST: 26 U/L (ref 0–37)
Bilirubin, Direct: 0.1 mg/dL (ref 0.0–0.3)
TOTAL PROTEIN: 7 g/dL (ref 6.0–8.3)
Total Bilirubin: 0.5 mg/dL (ref 0.2–1.2)

## 2017-10-26 LAB — HEMOGLOBIN A1C: HEMOGLOBIN A1C: 12 % — AB (ref 4.6–6.5)

## 2017-10-26 LAB — TSH: TSH: 1.87 u[IU]/mL (ref 0.35–4.50)

## 2017-10-26 LAB — LDL CHOLESTEROL, DIRECT: Direct LDL: 61 mg/dL

## 2017-10-26 MED ORDER — INSULIN DETEMIR 100 UNIT/ML ~~LOC~~ SOLN: SUBCUTANEOUS | 11 refills | Status: DC

## 2017-10-26 MED ORDER — VALACYCLOVIR HCL 1 G PO TABS: 1000.0000 mg | ORAL_TABLET | Freq: Three times a day (TID) | ORAL | 0 refills | Status: AC

## 2017-10-26 MED ORDER — HYDROCODONE-ACETAMINOPHEN 5-325 MG PO TABS: 1.0000 | ORAL_TABLET | Freq: Four times a day (QID) | ORAL | 0 refills | Status: DC | PRN

## 2017-10-26 NOTE — Assessment & Plan Note (Signed)
Stable, for new wheelchair rx

## 2017-10-26 NOTE — Progress Notes (Signed)
Subjective:    Patient ID: [name], female    DOB: February 07, 1946, 72 y.o.   MRN: [id number]  HPI  Here to f/u; overall doing ok,  Pt denies chest pain, increasing sob or doe, wheezing, orthopnea, PND, increased LE swelling, palpitations, dizziness or syncope.  Pt denies new neurological symptoms such as new headache, or facial or extremity weakness or numbness.  Pt denies polydipsia, polyuria, or low sugar episode.  Pt states overall good compliance with meds, mostly trying to follow appropriate diet, with wt overall stable.   S/p CVA in 2016 with dense right hemiplegia; here with son primary caretaker;  Today with c/o 3 days onset rash with left arm pain , mild to mod but constant, good compliance with meds including insuin but not checked sugars lately.  Needs new wheelchair with hx of CVA with right hemiplegia.   Pt denies fever, wt loss, night sweats, loss of appetite, or other constitutional symptoms   Past Medical History:  Diagnosis Date  . Acute blood loss anemia   . CHF (congestive heart failure) (HCC)    EF 02-58%, grade 1 diastolic dysfunction per echo 04/2015  . 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 CVA (cerebral infarction) 04/2015   Started Plavix 04/2015  . Depression   . Diabetes ([place]) 2016   Type II. On insulin  . Enchondroma of bone 2011   left femur.   . Fracture, intertrochanteric, right femur ([place])   . History of lower GI bleeding   . Hyperlipidemia   . Hypertension   . Patent foramen ovale 04/2015   Started on warfarin 04/2015  . Protein calorie malnutrition ([place])   . Stercoral ulcer of rectum 05/16/2015   Past Surgical History:  Procedure Laterality Date  . CARDIAC SURGERY     Cath without stent  . COLONOSCOPY N/A 05/15/2015   Procedure: COLONOSCOPY;  Surgeon: Mauri Pole, MD;  Location: [facility] ENDOSCOPY;  Service: Endoscopy;  Laterality: N/A;  . FLEXIBLE SIGMOIDOSCOPY N/A 05/15/2015   Procedure: FLEXIBLE SIGMOIDOSCOPY;  Surgeon: Mauri Pole, MD;  Location: [place] ENDOSCOPY;  Service: Endoscopy;  Laterality: N/A;  at bedside  . INTRAMEDULLARY (IM) NAIL INTERTROCHANTERIC Right 06/12/2015   Procedure: INTRAMEDULLARY (IM) NAIL RIGHT HIP;  Surgeon: Renette Butters, MD;  Location: [place];  Service: Orthopedics;  Laterality: Right;  . TEE WITHOUT CARDIOVERSION N/A 05/05/2015   Procedure: TRANSESOPHAGEAL ECHOCARDIOGRAM (TEE);  Surgeon: Dixie Dials, MD;  Location: [facility] ENDOSCOPY;  Service: Cardiovascular;  Laterality: N/A;    reports that she quit smoking about 2 years ago. Her smoking use included cigarettes. She has never used smokeless tobacco. She reports that she does not drink alcohol or use drugs. family history includes Heart attack in her father; Heart failure in her mother; Hyperlipidemia in her mother; Hypertension in her mother. Allergies  Allergen Reactions  . Pork-Derived Products Other (See Comments)    To keep blood pressure down   Current Outpatient Medications on File Prior to Visit  Medication Sig Dispense Refill  . atorvastatin (LIPITOR) 40 MG tablet Take 1 tablet (40 mg total) by mouth daily. 30 tablet 3  . baclofen (LIORESAL) 10 MG tablet TAKE ONE TABLET BY MOUTH THREE TIMES DAILY 90 tablet 11  . gabapentin (NEURONTIN) 300 MG capsule TAKE ONE CAPSULE BY MOUTH THREE TIMES DAILY 90 capsule 11  . metFORMIN (GLUCOPHAGE) 500 MG tablet Take 500 mg by mouth 2 (two) times daily with a meal.    .  metoprolol succinate (TOPROL-XL) 25 MG 24 hr tablet Take 25 mg by mouth daily. Hold for SBP <110 or HR <60    . nitroGLYCERIN (NITROSTAT) 0.4 MG SL tablet Place 0.4 mg under the tongue every 5 (five) minutes as needed for chest pain (up to 3 doses.  If no relief call MD/NP).     Marland Kitchen senna-docusate (SENOKOT-S) 8.6-50 MG tablet Take 2 tablets by mouth 2 (two) times daily. Until stooling regularly 30 tablet 0  . warfarin (COUMADIN) 5 MG tablet Take 5 mg by mouth daily.       Current Facility-Administered Medications on File Prior to Visit  Medication Dose Route Frequency Provider Last Rate Last Dose  . incobotulinumtoxinA (XEOMIN) 100 units injection 500 Units  500 Units Intramuscular Q90 days Marcial Pacas, MD   500 Units at 07/20/17 1544   Review of Systems  Constitutional: Negative for other unusual diaphoresis or sweats HENT: Negative for ear discharge or swelling Eyes: Negative for other worsening visual disturbances Respiratory: Negative for stridor or other swelling  Gastrointestinal: Negative for worsening distension or other blood Genitourinary: Negative for retention or other urinary change Musculoskeletal: Negative for other MSK pain or swelling Skin: Negative for color change or other new lesions Neurological: Negative for worsening tremors and other numbness  Psychiatric/Behavioral: Negative for worsening agitation or other fatigue All other system neg per pt    Objective:   Physical Exam BP 112/78   Temp 98 F (36.7 C) (Oral)   Ht 5\' 6"  (1.676 m)   Wt 156 lb (70.8 kg)   BMI 25.18 kg/m  VS noted,  Constitutional: Pt appears in NAD HENT: Head: NCAT.  Right Ear: External ear normal.  Left Ear: External ear normal.  Eyes: . Pupils are equal, round, and reactive to light. Conjunctivae and EOM are normal Nose: without d/c or deformity Neck: Neck supple. Gross normal ROM Cardiovascular: Normal rate and regular rhythm.   Pulmonary/Chest: Effort normal and breath sounds without rales or wheezing.  Abd:  Soft, NT, ND, + BS, no organomegaly Neurological: Pt is alert. At baseline orientation, motor with right dense hemiplegia Skin: Skin is warm. + left upper medial arm grouped vesicles on erythem base rash, no other new lesions, no LE edema Psychiatric: Pt behavior is normal without agitation  No other exam findings    Assessment & Plan:

## 2017-10-26 NOTE — Patient Instructions (Addendum)
Please remember to make your yearly eye doctor appt  You had the Prevnar 13 pneumonia shot today  Please take all new medication as prescribed - the Valtrex (antibiotic for the shingles), and the pain medication if needed  Please continue all other medications as before, and refills have been done if requested.  Please have the pharmacy call with any other refills you may need.  Please continue your efforts at being more active, low cholesterol diabetic diet, and weight control.  You are otherwise up to date with prevention measures today.  Please keep your appointments with your specialists as you may have planned  You are given the prescription for the wheelchair  Please go to the LAB in the Basement (turn left off the elevator) for the tests to be done today  You will be contacted by phone if any changes need to be made immediately.  Otherwise, you will receive a letter about your results with an explanation, but please check with MyChart first.  Please remember to sign up for MyChart if you have not done so, as this will be important to you in the future with finding out test results, communicating by private email, and scheduling acute appointments online when needed.  Please return in 6 months, or sooner if needed

## 2017-10-26 NOTE — Assessment & Plan Note (Signed)
Mild to mod, for valtrex asd, hydrocodone prn,  to f/u any worsening symptoms or concerns

## 2017-10-26 NOTE — Assessment & Plan Note (Signed)
stable overall by history and exam, recent data reviewed with pt, and pt to continue medical treatment as before,  to f/u any worsening symptoms or concerns, for f/u labs, a1c

## 2017-10-27 ENCOUNTER — Telehealth: Payer: Self-pay

## 2017-10-27 NOTE — Telephone Encounter (Signed)
Pt's son has been informed and expressed understanding.    

## 2017-10-27 NOTE — Telephone Encounter (Signed)
-----   Message from Biagio Borg, MD sent at 10/26/2017  6:58 PM EDT ----- Left message on MyChart, pt to cont same tx except  The test results show that your current treatment is OK, except the sugar is severely elevated overall.  We need to increase the levemir to 40 units per day, and refer to Endocrinology for further treatment.    [name] to please inform pt's son who is the primary caretaker, I will do rx

## 2017-11-04 ENCOUNTER — Encounter: Payer: Self-pay | Admitting: Internal Medicine

## 2017-11-04 ENCOUNTER — Other Ambulatory Visit (INDEPENDENT_AMBULATORY_CARE_PROVIDER_SITE_OTHER): Payer: Medicare Other

## 2017-11-04 ENCOUNTER — Other Ambulatory Visit: Payer: Self-pay | Admitting: Internal Medicine

## 2017-11-04 DIAGNOSIS — I639 Cerebral infarction, unspecified: Secondary | ICD-10-CM

## 2017-11-04 DIAGNOSIS — E1159 Type 2 diabetes mellitus with other circulatory complications: Secondary | ICD-10-CM | POA: Diagnosis not present

## 2017-11-04 DIAGNOSIS — B029 Zoster without complications: Secondary | ICD-10-CM

## 2017-11-04 LAB — URINALYSIS, ROUTINE W REFLEX MICROSCOPIC
Bilirubin Urine: NEGATIVE
Ketones, ur: NEGATIVE
Nitrite: NEGATIVE
PH: 5 (ref 5.0–8.0)
SPECIFIC GRAVITY, URINE: 1.015 (ref 1.000–1.030)
TOTAL PROTEIN, URINE-UPE24: NEGATIVE
Urine Glucose: NEGATIVE
Urobilinogen, UA: 1 (ref 0.0–1.0)

## 2017-11-04 LAB — MICROALBUMIN / CREATININE URINE RATIO
Creatinine,U: 89.3 mg/dL
MICROALB/CREAT RATIO: 2.6 mg/g (ref 0.0–30.0)
Microalb, Ur: 2.3 mg/dL — ABNORMAL HIGH (ref 0.0–1.9)

## 2017-11-04 MED ORDER — CEPHALEXIN 500 MG PO CAPS: 500.0000 mg | ORAL_CAPSULE | Freq: Three times a day (TID) | ORAL | 0 refills | Status: AC

## 2017-11-07 ENCOUNTER — Telehealth: Payer: Self-pay

## 2017-11-07 DIAGNOSIS — R310 Gross hematuria: Secondary | ICD-10-CM | POA: Diagnosis not present

## 2017-11-07 DIAGNOSIS — N3021 Other chronic cystitis with hematuria: Secondary | ICD-10-CM | POA: Diagnosis not present

## 2017-11-07 NOTE — Telephone Encounter (Signed)
-----   Message from Biagio Borg, MD sent at 11/04/2017  6:31 PM EDT ----- Left message on MyChart, pt to cont same tx except  The test results show that your current treatment is OK, except the urine test is consistent with probable infection.  We should treat with an antibiotic,  I will send a prescription, and you should hear from the office as well.    [name] to please inform pt, I will do rx

## 2017-11-07 NOTE — Telephone Encounter (Signed)
Pt's son has been informed and expressed understanding.    

## 2017-11-15 ENCOUNTER — Ambulatory Visit: Payer: Self-pay | Admitting: *Deleted

## 2017-11-15 NOTE — Telephone Encounter (Signed)
Blood during UTI tx is very common and does not need further evaluation now.  OK to wait for urology as planned

## 2017-11-15 NOTE — Telephone Encounter (Signed)
Pt is  being treated for a urinary tract infection.She has one day of keflex left.Patients son states he noticed blood in her urine 4-5 days ago.He states she has had blood in the urine in past.He states she has no symptoms of fever or discomfort. Patient is in a wheelchair.Patient has an appointment next week with a urologist.Patient is requesting if it is possible to bring in a  Urine  Sample for testing or he is willing to schedule an appointment if that is indicated. Answer Assessment - Initial Assessment Questions 1. SYMPTOM: "What's the main symptom you're concerned about?" (e.g., frequency, incontinence)       HEMATURIA   -  2. ONSET: "When did the  ________  start?"        Noticed  4-5  Days   3. PAIN: "Is there any pain?" If so, ask: "How bad is it?" (Scale: 1-10; mild, moderate, severe)         No pain     4. CAUSE: "What do you think is causing the symptoms?"       Being treated  For a  uti   5. OTHER SYMPTOMS: "Do you have any other symptoms?" (e.g., fever, flank pain, blood in urine, pain with urination)         Blood  In  Urine    6. PREGNANCY: "Is there any chance you are pregnant?" "When was your last menstrual period?"     n/a  Protocols used: URINARY [facility]

## 2017-11-15 NOTE — Telephone Encounter (Signed)
Please advise 

## 2017-11-16 NOTE — Telephone Encounter (Signed)
Pt's son has been informed and expressed understanding.    

## 2017-11-17 ENCOUNTER — Ambulatory Visit: Payer: Medicare Other | Admitting: Endocrinology

## 2017-11-18 ENCOUNTER — Ambulatory Visit: Payer: BC Managed Care – PPO | Admitting: Podiatry

## 2017-11-22 DIAGNOSIS — K449 Diaphragmatic hernia without obstruction or gangrene: Secondary | ICD-10-CM | POA: Diagnosis not present

## 2017-11-22 DIAGNOSIS — R310 Gross hematuria: Secondary | ICD-10-CM | POA: Diagnosis not present

## 2017-12-03 ENCOUNTER — Emergency Department (HOSPITAL_COMMUNITY)
Admission: EM | Admit: 2017-12-03 | Discharge: 2017-12-03 | Disposition: A | Discharge status: Home / Self Care | Payer: Medicare Other | Attending: Emergency Medicine | Admitting: Emergency Medicine

## 2017-12-03 ENCOUNTER — Emergency Department (HOSPITAL_COMMUNITY): Payer: Medicare Other

## 2017-12-03 ENCOUNTER — Other Ambulatory Visit: Payer: Self-pay

## 2017-12-03 ENCOUNTER — Encounter (HOSPITAL_COMMUNITY): Payer: Self-pay | Admitting: Emergency Medicine

## 2017-12-03 DIAGNOSIS — I2510 Atherosclerotic heart disease of native coronary artery without angina pectoris: Secondary | ICD-10-CM | POA: Insufficient documentation

## 2017-12-03 DIAGNOSIS — Z87891 Personal history of nicotine dependence: Secondary | ICD-10-CM | POA: Insufficient documentation

## 2017-12-03 DIAGNOSIS — R4781 Slurred speech: Secondary | ICD-10-CM | POA: Diagnosis not present

## 2017-12-03 DIAGNOSIS — I10 Essential (primary) hypertension: Secondary | ICD-10-CM | POA: Diagnosis not present

## 2017-12-03 DIAGNOSIS — I110 Hypertensive heart disease with heart failure: Secondary | ICD-10-CM | POA: Diagnosis not present

## 2017-12-03 DIAGNOSIS — Z794 Long term (current) use of insulin: Secondary | ICD-10-CM | POA: Insufficient documentation

## 2017-12-03 DIAGNOSIS — Z7901 Long term (current) use of anticoagulants: Secondary | ICD-10-CM | POA: Diagnosis not present

## 2017-12-03 DIAGNOSIS — R569 Unspecified convulsions: Secondary | ICD-10-CM | POA: Diagnosis not present

## 2017-12-03 DIAGNOSIS — F419 Anxiety disorder, unspecified: Secondary | ICD-10-CM | POA: Diagnosis not present

## 2017-12-03 DIAGNOSIS — Z79899 Other long term (current) drug therapy: Secondary | ICD-10-CM | POA: Insufficient documentation

## 2017-12-03 DIAGNOSIS — R4182 Altered mental status, unspecified: Secondary | ICD-10-CM | POA: Diagnosis not present

## 2017-12-03 DIAGNOSIS — I5030 Unspecified diastolic (congestive) heart failure: Secondary | ICD-10-CM | POA: Diagnosis not present

## 2017-12-03 DIAGNOSIS — R479 Unspecified speech disturbances: Secondary | ICD-10-CM | POA: Diagnosis not present

## 2017-12-03 DIAGNOSIS — E119 Type 2 diabetes mellitus without complications: Secondary | ICD-10-CM | POA: Diagnosis not present

## 2017-12-03 DIAGNOSIS — R29810 Facial weakness: Secondary | ICD-10-CM | POA: Diagnosis not present

## 2017-12-03 DIAGNOSIS — R0902 Hypoxemia: Secondary | ICD-10-CM | POA: Diagnosis not present

## 2017-12-03 DIAGNOSIS — N3000 Acute cystitis without hematuria: Secondary | ICD-10-CM | POA: Diagnosis not present

## 2017-12-03 DIAGNOSIS — G459 Transient cerebral ischemic attack, unspecified: Secondary | ICD-10-CM | POA: Diagnosis not present

## 2017-12-03 LAB — CBC
HEMATOCRIT: 48.1 % — AB (ref 36.0–46.0)
HEMOGLOBIN: 15 g/dL (ref 12.0–15.0)
MCH: 27.7 pg (ref 26.0–34.0)
MCHC: 31.2 g/dL (ref 30.0–36.0)
MCV: 88.7 fL (ref 78.0–100.0)
Platelets: 233 10*3/uL (ref 150–400)
RBC: 5.42 MIL/uL — AB (ref 3.87–5.11)
RDW: 13.7 % (ref 11.5–15.5)
WBC: 8.7 10*3/uL (ref 4.0–10.5)

## 2017-12-03 LAB — COMPREHENSIVE METABOLIC PANEL
ALK PHOS: 75 U/L (ref 38–126)
ALT: 21 U/L (ref 14–54)
AST: 26 U/L (ref 15–41)
Albumin: 3.5 g/dL (ref 3.5–5.0)
Anion gap: 10 (ref 5–15)
BUN: 8 mg/dL (ref 6–20)
CALCIUM: 9.4 mg/dL (ref 8.9–10.3)
CHLORIDE: 108 mmol/L (ref 101–111)
CO2: 25 mmol/L (ref 22–32)
CREATININE: 0.82 mg/dL (ref 0.44–1.00)
GFR calc non Af Amer: >60 mL/min (ref >60)
Glucose, Bld: 174 mg/dL — ABNORMAL HIGH (ref 65–99)
Potassium: 4.1 mmol/L (ref 3.5–5.1)
SODIUM: 143 mmol/L (ref 135–145)
Total Bilirubin: 0.7 mg/dL (ref 0.3–1.2)
Total Protein: 6.8 g/dL (ref 6.5–8.1)

## 2017-12-03 LAB — URINALYSIS, ROUTINE W REFLEX MICROSCOPIC
Bilirubin Urine: NEGATIVE
Glucose, UA: NEGATIVE mg/dL
Ketones, ur: NEGATIVE mg/dL
Nitrite: NEGATIVE
PROTEIN: NEGATIVE mg/dL
SPECIFIC GRAVITY, URINE: 1.013 (ref 1.005–1.030)
WBC, UA: >50 WBC/hpf — ABNORMAL HIGH (ref 0–5)
pH: 5 (ref 5.0–8.0)

## 2017-12-03 LAB — DIFFERENTIAL
ABS IMMATURE GRANULOCYTES: 0 10*3/uL (ref 0.0–0.1)
BASOS ABS: 0 10*3/uL (ref 0.0–0.1)
BASOS PCT: 1 %
Eosinophils Absolute: 0.1 10*3/uL (ref 0.0–0.7)
Eosinophils Relative: 2 %
Immature Granulocytes: 0 %
LYMPHS PCT: 38 %
Lymphs Abs: 3.3 10*3/uL (ref 0.7–4.0)
MONO ABS: 0.6 10*3/uL (ref 0.1–1.0)
MONOS PCT: 7 %
NEUTROS ABS: 4.6 10*3/uL (ref 1.7–7.7)
NEUTROS PCT: 52 %

## 2017-12-03 LAB — I-STAT CHEM 8, ED
BUN: 10 mg/dL (ref 6–20)
CALCIUM ION: 1.19 mmol/L (ref 1.15–1.40)
CHLORIDE: 107 mmol/L (ref 101–111)
CREATININE: 0.7 mg/dL (ref 0.44–1.00)
GLUCOSE: 169 mg/dL — AB (ref 65–99)
HCT: 47 % — ABNORMAL HIGH (ref 36.0–46.0)
Hemoglobin: 16 g/dL — ABNORMAL HIGH (ref 12.0–15.0)
POTASSIUM: 4 mmol/L (ref 3.5–5.1)
Sodium: 143 mmol/L (ref 135–145)
TCO2: 23 mmol/L (ref 22–32)

## 2017-12-03 LAB — I-STAT TROPONIN, ED: Troponin i, poc: 0 ng/mL (ref 0.00–0.08)

## 2017-12-03 MED ORDER — CEPHALEXIN 500 MG PO CAPS: 500.0000 mg | ORAL_CAPSULE | Freq: Four times a day (QID) | ORAL | 0 refills | Status: DC

## 2017-12-03 MED ORDER — LEVETIRACETAM IN NACL 1000 MG/100ML IV SOLN
1000.0000 mg | Freq: Once | INTRAVENOUS | Status: AC
  Administered 2017-12-03: 1000 mg via INTRAVENOUS
  Filled 2017-12-03: qty 100

## 2017-12-03 MED ORDER — CEPHALEXIN 250 MG PO CAPS
500.0000 mg | ORAL_CAPSULE | Freq: Once | ORAL | Status: AC
  Administered 2017-12-03: 500 mg via ORAL
  Filled 2017-12-03: qty 2

## 2017-12-03 MED ORDER — LEVETIRACETAM 500 MG PO TABS: 500.0000 mg | ORAL_TABLET | Freq: Two times a day (BID) | ORAL | 0 refills | Status: DC

## 2017-12-03 NOTE — Consult Note (Signed)
Neurology Consultation Reason for Consult: Facial droop Referring Physician: Aileen Pilot  CC: Unresponsiveness  History is obtained from: Son  HPI: [name] is a 72 y.o. female with a history of previous stroke with right hemiparesis and aphasia who was in her normal state of health this morning.  Her son was preparing food for them, when he saw her slumped to the side.  He went around checked and found her convulsing.  Is lasted for a minute or 2 following which she had a deep sonorous respiration.  She had already been incontinent prior to the seizure and therefore it is difficult to say if she was more incontinent with the seizure.  Following this, she had mild worsening of her chronic right facial droop and slurred speech and  Therefore EMS activated a code stroke.  LKW: 7:10 AM tpa given?: no, seizure    ROS: A 14 point ROS was performed and is negative except as noted in the HPI.   Past Medical History:  Diagnosis Date  . Acute blood loss anemia   . CHF (congestive heart failure) (HCC)    EF 70-96%, grade 1 diastolic dysfunction per echo 04/2015  . 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 CVA (cerebral infarction) 04/2015   Started Plavix 04/2015  . Depression   . Diabetes ([place]) 2016   Type II. On insulin  . Enchondroma of bone 2011   left femur.   . Fracture, intertrochanteric, right femur ([place])   . History of lower GI bleeding   . Hyperlipidemia   . Hypertension   . Patent foramen ovale 04/2015   Started on warfarin 04/2015  . Protein calorie malnutrition ([name])   . Stercoral ulcer of rectum 05/16/2015     Family History  Problem Relation Age of Onset  . Hypertension Mother   . Heart failure Mother   . Hyperlipidemia Mother   . Heart attack Father      Social History:  reports that she quit smoking about 2 years ago. Her smoking use included cigarettes. She has never used smokeless tobacco. She  reports that she does not drink alcohol or use drugs.   Exam: Current vital signs: BP 131/90   Pulse 89   Temp (!) 97.5 F (36.4 C)   Resp (!) 22   SpO2 96%  Vital signs in last 24 hours: Temp:  [97.5 F (36.4 C)] 97.5 F (36.4 C) (06/01 0920) Pulse Rate:  [81-91] 89 (06/01 0915) Resp:  [12-22] 22 (06/01 0915) BP: (118-131)/(76-90) 131/90 (06/01 0915) SpO2:  [95 %-96 %] 96 % (06/01 0915)   Physical Exam  Constitutional: Appears well-developed and well-nourished.  Psych: Affect appropriate to situation Eyes: No scleral injection HENT: No OP obstrucion Head: Normocephalic.  Cardiovascular: Normal rate and regular rhythm.  Respiratory: Effort normal, non-labored breathing GI: Soft.  No distension. There is no tenderness.  Skin: WDI  Neuro: Mental Status: Patient is awake, alert, she has a significant expressive aphasia, unable to repeat. Cranial Nerves: II: Visual Fields are full. Pupils are equal, round, and reactive to light.   III,IV, VI: EOMI without ptosis or diploplia.  V: Facial sensation is symmetric to temperature VII: Facial movement with mild right facial weakness VIII: hearing is intact to voice X: Uvula elevates symmetrically XI: Shoulder shrug is symmetric. XII: tongue is midline without atrophy or fasciculations.  Motor: She has a severe right hemiparesis with minimal movement of her right arm and no  movement of the right leg (baseline) Sensory: Sensation is symmetric to light touch and temperature in the arms and legs. Cerebellar: FNF and HKS are intact bilaterally  I have reviewed labs in epic and the results pertinent to this consultation are: CMP-unremarkable  I have reviewed the images obtained: CT head - negative  Impression: 72 year old female with new onset seizure in setting of previous stroke.  With the questionable findings on CT, I do think MRI is needed.  If this is negative, then I would favor treating this is new onset seizure with  epileptogenic focus and therefore she will need to be started on antiepileptic therapy indefinitely.  Recommendations: 1) Keppra 1 g x 1 followed by 500 mg twice daily 2) MRI brain 3) if MRI brain is negative, she can be discharged and follow-up with her outpatient neurologist.   Roland Rack, MD [facility] [phone]  If 7pm- 7am, please page neurology on call as listed in [place].

## 2017-12-03 NOTE — ED Notes (Signed)
Urine culture tube sent to lab with UA.  

## 2017-12-03 NOTE — ED Provider Notes (Signed)
[facility] EMERGENCY DEPARTMENT Provider Note   CSN: [id number] Arrival date & time: 12/03/17  7048     History   Chief Complaint Chief Complaint  Patient presents with  . Code Stroke    HPI [name] is a 72 y.o. female.  Pt presents to the ED as a code stroke.  The pt has a hx of prior stroke with resultant right hemiplegia for which she is on coumadin.  The pt lives with her son.  Her son noticed some seizure like movements.   Her son noticed that she had a right sided facial droop and difficulty speaking after the event.  EMS was called and facial droop has resolved.  Pt is a poor historian.  Dr. Leonel Ramsay (neurology) [place].     Past Medical History:  Diagnosis Date  . Acute blood loss anemia   . CHF (congestive heart failure) (HCC)    EF 88-91%, grade 1 diastolic dysfunction per echo 04/2015  . 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 CVA (cerebral infarction) 04/2015   Started Plavix 04/2015  . Depression   . Diabetes ([place]) 2016   Type II. On insulin  . Enchondroma of bone 2011   left femur.   . Fracture, intertrochanteric, right femur ([place])   . History of lower GI bleeding   . Hyperlipidemia   . Hypertension   . Patent foramen ovale 04/2015   Started on warfarin 04/2015  . Protein calorie malnutrition ([place])   . Stercoral ulcer of rectum 05/16/2015    Patient Active Problem List   Diagnosis Date Noted  . Shingles 10/26/2017  . Constipation 04/20/2017  . Skin ulcer of right heel, limited to breakdown of skin ([place]) 02/10/2017  . Muscle spasticity 01/20/2016  . Bilateral lower extremity edema 12/23/2015  . Acute confusional state 10/28/2015  . Right spastic hemiparesis ([place]) 10/07/2015  . Right shoulder pain 07/24/2015  . Fracture, intertrochanteric, right femur ([place]) 06/10/2015  . Stercoral ulcer of rectum 05/16/2015  . GI bleed 05/13/2015  . BRBPR (bright  red blood per rectum) 05/13/2015  . CHF (congestive heart failure) ([place])   . Diabetes ([place])   . Hypertension   . History of stroke   . Lower GI bleed   . Left-sided cerebrovascular accident (CVA) ([place]) 04/28/2015    Past Surgical History:  Procedure Laterality Date  . CARDIAC SURGERY     Cath without stent  . COLONOSCOPY N/A 05/15/2015   Procedure: COLONOSCOPY;  Surgeon: Mauri Pole, MD;  Location[facility] ENDOSCOPY;  Service: Endoscopy;  Laterality: N/A;  . FLEXIBLE SIGMOIDOSCOPY N/A 05/15/2015   Procedure: FLEXIBLE SIGMOIDOSCOPY;  Surgeon: Mauri Pole, MD;  Location: [facility] ENDOSCOPY;  Service: Endoscopy;  Laterality: N/A;  at bedside  . INTRAMEDULLARY (IM) NAIL INTERTROCHANTERIC Right 06/12/2015   Procedure: INTRAMEDULLARY (IM) NAIL RIGHT HIP;  Surgeon: Renette Butters, MD;  Location: [facility];  Service: Orthopedics;  Laterality: Right;  . TEE WITHOUT CARDIOVERSION N/A 05/05/2015   Procedure: TRANSESOPHAGEAL ECHOCARDIOGRAM (TEE);  Surgeon: Dixie Dials, MD;  Location[facility] ENDOSCOPY;  Service: Cardiovascular;  Laterality: N/A;     OB History   None      Home Medications    Prior to Admission medications   Medication Sig Start Date End Date Taking? Authorizing Provider  atorvastatin (LIPITOR) 40 MG tablet Take 1 tablet (40 mg total) by mouth daily. 05/06/15  Yes Charolette Forward, MD  baclofen (  LIORESAL) 10 MG tablet TAKE ONE TABLET BY MOUTH THREE TIMES DAILY 11/18/16  Yes Marcial Pacas, MD  gabapentin (NEURONTIN) 300 MG capsule TAKE ONE CAPSULE BY MOUTH THREE TIMES DAILY 12/06/16  Yes Sater, Nanine Means, MD  insulin detemir (LEVEMIR) 100 UNIT/ML injection 40 units sq once daily 10/26/17  Yes Biagio Borg, MD  metoprolol succinate (TOPROL-XL) 25 MG 24 hr tablet Take 25 mg by mouth daily. Hold for SBP <110 or HR <60   Yes [provider]  nitroGLYCERIN (NITROSTAT) 0.4 MG SL tablet Place 0.4 mg under the tongue every 5 (five) minutes as needed for chest pain (up to 3 doses.  If no  relief call MD/NP).    Yes [provider]  warfarin (COUMADIN) 5 MG tablet Take 5 mg by mouth daily.    Yes [provider]  cephALEXin (KEFLEX) 500 MG capsule Take 1 capsule (500 mg total) by mouth 4 (four) times daily. 12/03/17   Isla Pence, MD  HYDROcodone-acetaminophen (NORCO/VICODIN) 5-325 MG tablet Take 1 tablet by mouth every 6 (six) hours as needed for moderate pain. Patient not taking: Reported on 12/03/2017 10/26/17   Biagio Borg, MD  levETIRAcetam (KEPPRA) 500 MG tablet Take 1 tablet (500 mg total) by mouth 2 (two) times daily. 12/03/17   Isla Pence, MD  senna-docusate (SENOKOT-S) 8.6-50 MG tablet Take 2 tablets by mouth 2 (two) times daily. Until stooling regularly Patient not taking: Reported on 12/03/2017 04/20/17   Rosemarie Ax, MD    Family History Family History  Problem Relation Age of Onset  . Hypertension Mother   . Heart failure Mother   . Hyperlipidemia Mother   . Heart attack Father     Social History Social History   Tobacco Use  . Smoking status: Former Smoker    Types: Cigarettes    Last attempt to quit: 04/05/2015    Years since quitting: 2.6  . Smokeless tobacco: Never Used  . Tobacco comment: Quit 04/28/15  Substance Use Topics  . Alcohol use: No    Alcohol/week: 0.0 oz  . Drug use: No     Allergies   Pork-derived products   Review of Systems Review of Systems  Unable to perform ROS: Acuity of condition     Physical Exam Updated Vital Signs BP 106/86   Pulse 84   Temp (!) 97.5 F (36.4 C)   Resp 13   SpO2 96%   Physical Exam  Constitutional: She appears well-developed and well-nourished.  HENT:  Head: Normocephalic and atraumatic.  Right Ear: External ear normal.  Left Ear: External ear normal.  Nose: Nose normal.  Mouth/Throat: Oropharynx is clear and moist.  Eyes: Pupils are equal, round, and reactive to light. Conjunctivae and EOM are normal.  Neck: Normal range of motion. Neck supple.    Cardiovascular: Normal rate, regular rhythm, normal heart sounds and intact distal pulses.  Pulmonary/Chest: Effort normal and breath sounds normal.  Abdominal: Soft. Bowel sounds are normal.  Musculoskeletal: Normal range of motion.  Neurological: She is alert.  Right sided weakness (chronic)  Skin: Skin is warm. Capillary refill takes less than 2 seconds.  Psychiatric: Her mood appears anxious.  Nursing note and vitals reviewed.    ED Treatments / Results  Labs (all labs ordered are listed, but only abnormal results are displayed) Labs Reviewed  CBC - Abnormal; Notable for the following components:      Result Value   RBC 5.42 (*)    HCT 48.1 (*)  All other components within normal limits  COMPREHENSIVE METABOLIC PANEL - Abnormal; Notable for the following components:   Glucose, Bld 174 (*)    All other components within normal limits  URINALYSIS, ROUTINE W REFLEX MICROSCOPIC - Abnormal; Notable for the following components:   APPearance HAZY (*)    Hgb urine dipstick MODERATE (*)    Leukocytes, UA MODERATE (*)    WBC, UA >50 (*)    Bacteria, UA MANY (*)    All other components within normal limits  I-STAT CHEM 8, ED - Abnormal; Notable for the following components:   Glucose, Bld 169 (*)    Hemoglobin 16.0 (*)    HCT 47.0 (*)    All other components within normal limits  URINE CULTURE  DIFFERENTIAL  PROTIME-INR  APTT  I-STAT TROPONIN, ED  CBG MONITORING, ED    EKG EKG Interpretation  Date/Time:  Saturday December 03 2017 08:19:11 EDT Ventricular Rate:  94 PR Interval:    QRS Duration: 95 QT Interval:  390 QTC Calculation: 488 R Axis:   -45 Text Interpretation:  Sinus rhythm Left anterior fascicular block Low voltage, precordial leads Consider anterior infarct Confirmed by Isla Pence [phone]) on 12/03/2017 8:22:35 AM   Radiology Mr Brain Wo Contrast  Result Date: 12/03/2017 CLINICAL DATA:  History of stroke with right hemiparesis and aphasia. Acute  mental status change today with convulsion. Question hyperdense left MCA at CT. EXAM: MRI HEAD WITHOUT CONTRAST TECHNIQUE: Multiplanar, multiecho pulse sequences of the brain and surrounding structures were obtained without intravenous contrast. COMPARISON:  CT same day.  MRI 05/01/2015. FINDINGS: Brain: Diffusion imaging does not show any acute or subacute infarction. Minimal small vessel changes are seen within the pons. No cerebellar insult. Cerebral hemispheres show old infarction in the left anterior cerebral artery distribution. There is also a small region of infarction in the right anterior cerebral artery territory. This may have been acute in April of 2017. Small-vessel ischemic changes affect the deep white matter. No sign of mass lesion, hemorrhage, hydrocephalus or extra-axial collection. Mesial temporal lobes are symmetric and within normal limits. Vascular: Major vessels at the base of the brain show flow. Skull and upper cervical spine: Negative Sinuses/Orbits: Clear/normal Other: None IMPRESSION: No acute finding by MRI. Old infarction in the left anterior cerebral artery territory which was acute in 2016. Smaller old infarction in the right anterior cerebral artery territory which may have been acute in 2017. This examination does not suggest any acute occlusive disease in the left MCA territory. Electronically Signed   By: Nelson Chimes M.D.   On: 12/03/2017 10:37   Ct Head Code Stroke Wo Contrast  Result Date: 12/03/2017 CLINICAL DATA:  Code stroke. Slurred speech. Right facial droop. Last seen normal 0710 hours EXAM: CT HEAD WITHOUT CONTRAST TECHNIQUE: Contiguous axial images were obtained from the base of the skull through the vertex without intravenous contrast. COMPARISON:  10/24/2015 FINDINGS: Brain: No acute parenchymal finding. Question hyperdense left MCA branches in the sylvian fissure. Brainstem and cerebellum are unremarkable. Chronic small-vessel ischemic changes seen affecting the  cerebral hemispheric white matter. Old infarction in the left anterior cerebral artery territory. No evidence of hemorrhage, hydrocephalus or extra-axial collection. Vascular: There is atherosclerotic calcification of the major vessels at the base of the brain. Again, question of hyperdense left MCA branches in the sylvian fissure Skull: Negative Sinuses/Orbits: Clear/normal Other: None ASPECTS ([place] Stroke Program Early CT Score) - Ganglionic level infarction (caudate, lentiform nuclei, internal capsule, insula, M1-M3 cortex): 7 -  Supraganglionic infarction (M4-M6 cortex): 3 Total score (0-10 with 10 being normal): 10 IMPRESSION: 1. No acute parenchymal finding. Question hyperdense left MCA branches in the sylvian fissure which could go along with left MCA embolic disease. 2. Atrophy and chronic small-vessel ischemic changes. Old left ACA territory infarction. 3. ASPECTS is 10. 4. These results were communicated to Dr. Leonel Ramsay at 8:13 amon 6/1/2019by text page via the [facility] messaging system. Electronically Signed   By: Nelson Chimes M.D.   On: 12/03/2017 08:15    Procedures Procedures (including critical care time)  Medications Ordered in ED Medications  cephALEXin (KEFLEX) capsule 500 mg (has no administration in time range)  levETIRAcetam (KEPPRA) IVPB 1000 mg/100 mL premix (0 mg Intravenous Stopped 12/03/17 1250)     Initial Impression / Assessment and Plan / ED Course  I have reviewed the triage vital signs and the nursing notes.  Pertinent labs & imaging results that were available during my care of the patient were reviewed by me and considered in my medical decision making (see chart for details).  MRI shows no evidence of acute CVA.  Dr. Leonel Ramsay recommends d/c home with rx for keppra.  Pt to f/u with neurology as an outpatient.     Pt has had no further episodes while here and is stable for d/c.  Return if worse.  Final Clinical Impressions(s) / ED Diagnoses   Final diagnoses:   Seizure ([place])  Acute cystitis without hematuria    ED Discharge Orders        Ordered    cephALEXin (KEFLEX) 500 MG capsule  4 times daily     12/03/17 1258    levETIRAcetam (KEPPRA) 500 MG tablet  2 times daily     12/03/17 1258       Isla Pence, MD 12/03/17 1301

## 2017-12-03 NOTE — ED Triage Notes (Signed)
Pt in from home via [facility] as code stroke, LSN 0710. Reported that son witnessed "seizure-like" activity while in shower. Pt then had slurred speech and R side face droop. Hx of old CVA with R side deficits. Airway intact on arrival, takes coumadin, no IV access

## 2017-12-03 NOTE — ED Notes (Signed)
Patient transported to MRI 

## 2017-12-03 NOTE — ED Notes (Signed)
Pt returned from MRI °

## 2017-12-05 DIAGNOSIS — E119 Type 2 diabetes mellitus without complications: Secondary | ICD-10-CM | POA: Diagnosis not present

## 2017-12-05 LAB — URINE CULTURE

## 2017-12-06 ENCOUNTER — Telehealth: Payer: Self-pay | Admitting: Neurology

## 2017-12-06 ENCOUNTER — Telehealth: Payer: Self-pay | Admitting: *Deleted

## 2017-12-06 DIAGNOSIS — R310 Gross hematuria: Secondary | ICD-10-CM | POA: Diagnosis not present

## 2017-12-06 DIAGNOSIS — N3021 Other chronic cystitis with hematuria: Secondary | ICD-10-CM | POA: Diagnosis not present

## 2017-12-06 NOTE — Progress Notes (Signed)
ED Antimicrobial Stewardship Positive Culture Follow Up   [name] is an 72 y.o. female who presented to [facility] on 12/03/2017 with a chief complaint of  Chief Complaint  Patient presents with  . Code Stroke    Recent Results (from the past 720 hour(s))  Urine culture     Status: Abnormal   Collection Time: 12/03/17 11:26 AM  Result Value Ref Range Status   Specimen Description URINE, CATHETERIZED  Final   Special Requests NONE  Final   Culture (A)  Final    >=100,000 COLONIES/mL KLEBSIELLA PNEUMONIAE Confirmed Extended Spectrum Beta-Lactamase Producer (ESBL).  In bloodstream infections from ESBL organisms, carbapenems are preferred over piperacillin/tazobactam. They are shown to have a lower risk of mortality. Performed at [facility], [place] [address], [place],  [address]    Report Status 12/05/2017 FINAL  Final   Organism ID, Bacteria KLEBSIELLA PNEUMONIAE (A)  Final      Susceptibility   Klebsiella pneumoniae - MIC*    AMPICILLIN >=32 RESISTANT Resistant     CEFAZOLIN >=64 RESISTANT Resistant     CEFTRIAXONE >=64 RESISTANT Resistant     CIPROFLOXACIN <=0.25 SENSITIVE Sensitive     GENTAMICIN <=1 SENSITIVE Sensitive     IMIPENEM <=0.25 SENSITIVE Sensitive     NITROFURANTOIN <=16 SENSITIVE Sensitive     TRIMETH/SULFA >=320 RESISTANT Resistant     AMPICILLIN/SULBACTAM 16 INTERMEDIATE Intermediate     PIP/TAZO <=4 SENSITIVE Sensitive     Extended ESBL POSITIVE Resistant     * >=100,000 COLONIES/mL KLEBSIELLA PNEUMONIAE    [x]  Treated with cephalexin, organism resistant to prescribed antimicrobial  New antibiotic prescription: Macrobid 100 mg BID for 5 days  ED Provider: Eliezer Mccoy, PA-C   Oralia Manis 12/06/2017, 9:19 AM PharmD Candidate Phone# [phone]

## 2017-12-06 NOTE — Telephone Encounter (Signed)
Post ED Visit - Positive Culture Follow-up: Successful Patient Follow-Up  Culture assessed and recommendations reviewed by: []  Elenor Quinones, Pharm.D. []  Heide Guile, Pharm.D., BCPS AQ-ID []  Parks Neptune, Pharm.D., BCPS []  Alycia Rossetti, Pharm.D., BCPS []  [place], Pharm.D., BCPS, AAHIVP []  Legrand Como, Pharm.D., BCPS, AAHIVP []  Salome Arnt, PharmD, BCPS []  Wynell Balloon, PharmD []  Vincenza Hews, PharmD, BCPS  Positive urine culture  []  Patient discharged without antimicrobial prescription and treatment is now indicated [x]  Organism is resistant to prescribed ED discharge antimicrobial []  Patient with positive blood cultures  Changes discussed with ED provider: Eliezer Mccoy, PA New antibiotic prescription Macrobid 100mg  PO BID  X 5 days Called to [place], [facility] [phone]  Contacted patient, date 12/06/2017, time [place], [place] 12/06/2017, 9:58 AM

## 2017-12-06 NOTE — Telephone Encounter (Signed)
Patient's son calling stating patient was seen at [facility] ED on 12-03-17 for stroke like symptoms. She had right side face drooping and was not coherent at home. He said the ED prescribed Keppra. Please call and discuss.

## 2017-12-06 NOTE — Telephone Encounter (Signed)
Spoke to her son on [place] - she has been worked into Dr. Rhea Belton schedule on 12/08/17.  He is aware to continue Keppra 500mg  BID, as prescribed by the hospital, until she is evaluated in our office.  He verbalized understanding.

## 2017-12-08 ENCOUNTER — Ambulatory Visit (INDEPENDENT_AMBULATORY_CARE_PROVIDER_SITE_OTHER): Payer: Medicare Other | Admitting: Neurology

## 2017-12-08 ENCOUNTER — Encounter: Payer: Self-pay | Admitting: Neurology

## 2017-12-08 VITALS — BP 111/71 | HR 80

## 2017-12-08 DIAGNOSIS — I639 Cerebral infarction, unspecified: Secondary | ICD-10-CM

## 2017-12-08 DIAGNOSIS — G40209 Localization-related (focal) (partial) symptomatic epilepsy and epileptic syndromes with complex partial seizures, not intractable, without status epilepticus: Secondary | ICD-10-CM | POA: Diagnosis not present

## 2017-12-08 MED ORDER — LEVETIRACETAM 500 MG PO TABS: 500.0000 mg | ORAL_TABLET | Freq: Two times a day (BID) | ORAL | 4 refills | Status: DC

## 2017-12-08 NOTE — Progress Notes (Signed)
Chief Complaint  Patient presents with  . Seizures    She is here with her son, [name].  Reports his mother was seen in ED on 12/03/17 for seizure-like activity.  She was started on Keppra 500mg  BID and instructed to follow up here.  History of CVA.  . Right spastic hemiparesis    Her last Xeomin occurred on 07/20/17.  Her son feels the medication was helpful and would like her to restart treatment.      PATIENT: [name] DOB: [birth year]   HISTORICAL  [name] is a 72 years old right-handed female, accompanied by her son [name] follow-up emergency room visit on December 03, 2017 for partial seizure,  She had a history of hypertension, diabetes, hyperlipidemia, she suffered left ACA infarction with left A2 occlusion in October 2017, presented with acute onset slurred speech, right leg and arm weakness,    She was found to have patent foramina ovale by TEE in October 2016, was put on Coumadin as stroke prevention  She also suffered right hip fracture require surgery in June 12 2015, She now lives at home with her son [name]  Since stroke, she suffered severe right shoulder pain, right shoulder tightness, limited range of motion, there was also intermittent confusion word finding difficulties more than her baseline level, but there was never seizure activity noted  X-ray of her right shoulder in January 2017 showed no significant abnormality  Previous EEG in May 2017 showed mild background slowing, there was no evidence of epileptiform discharge. Laboratory evaluation in 2017 A1c 8.4, frequent UTIs, INR 1.33-2.5, she supposed to take Coumadin, CMP showed elevated glucose 194, creatinine 0.91,   She begin to receive EMG guided botulism toxin injection for spastic right hemiparesis since April 2018, which has helped relax her right shoulder, last injection was in January 2019,  On December 03, 2017, she was noted by her son [name] slumped over, foaming, out of the mouth, right side body  jerking movement, unresponsive, followed by extreme confused, fatigue afterwards,  MRI of brain on December 03, 2017 showed no acute abnormality, old infarction in the left anterior cerebral artery territory, smaller old infarction in the right anterior cerebral artery,  CMP showed hemoglobin of 16, creatinine of 0.7, glucose of 169  REVIEW OF SYSTEMS: Full 14 system review of systems performed and notable only for right shoulder pain  ALLERGIES: Allergies  Allergen Reactions  . Pork-Derived Products Other (See Comments)    To keep blood pressure down    HOME MEDICATIONS: Current Outpatient Medications  Medication Sig Dispense Refill  . atorvastatin (LIPITOR) 40 MG tablet Take 1 tablet (40 mg total) by mouth daily. 30 tablet 3  . baclofen (LIORESAL) 10 MG tablet TAKE ONE TABLET BY MOUTH THREE TIMES DAILY 90 tablet 11  . gabapentin (NEURONTIN) 300 MG capsule TAKE ONE CAPSULE BY MOUTH THREE TIMES DAILY 90 capsule 11  . insulin detemir (LEVEMIR) 100 UNIT/ML injection 40 units sq once daily 10 mL 11  . levETIRAcetam (KEPPRA) 500 MG tablet Take 1 tablet (500 mg total) by mouth 2 (two) times daily. 60 tablet 0  . nitroGLYCERIN (NITROSTAT) 0.4 MG SL tablet Place 0.4 mg under the tongue every 5 (five) minutes as needed for chest pain (up to 3 doses.  If no relief call MD/NP).     Marland Kitchen warfarin (COUMADIN) 5 MG tablet Take 5 mg by mouth daily.      No current facility-administered medications for this visit.  PAST MEDICAL HISTORY: Past Medical History:  Diagnosis Date  . Acute blood loss anemia   . CHF (congestive heart failure) (HCC)    EF 60-73%, grade 1 diastolic dysfunction per echo 04/2015  . 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 CVA (cerebral infarction) 04/2015   Started Plavix 04/2015  . Depression   . Diabetes ([place]) 2016   Type II. On insulin  . Enchondroma of bone 2011   left femur.   . Fracture, intertrochanteric,  right femur ([place])   . History of lower GI bleeding   . Hyperlipidemia   . Hypertension   . Patent foramen ovale 04/2015   Started on warfarin 04/2015  . Protein calorie malnutrition ([place])   . Stercoral ulcer of rectum 05/16/2015    PAST SURGICAL HISTORY: Past Surgical History:  Procedure Laterality Date  . CARDIAC SURGERY     Cath without stent  . COLONOSCOPY N/A 05/15/2015   Procedure: COLONOSCOPY;  Surgeon: Mauri Pole, MD;  Location: [facility] ENDOSCOPY;  Service: Endoscopy;  Laterality: N/A;  . FLEXIBLE SIGMOIDOSCOPY N/A 05/15/2015   Procedure: FLEXIBLE SIGMOIDOSCOPY;  Surgeon: Mauri Pole, MD;  Location: [facility] ENDOSCOPY;  Service: Endoscopy;  Laterality: N/A;  at bedside  . INTRAMEDULLARY (IM) NAIL INTERTROCHANTERIC Right 06/12/2015   Procedure: INTRAMEDULLARY (IM) NAIL RIGHT HIP;  Surgeon: Renette Butters, MD;  Location: [facility];  Service: Orthopedics;  Laterality: Right;  . TEE WITHOUT CARDIOVERSION N/A 05/05/2015   Procedure: TRANSESOPHAGEAL ECHOCARDIOGRAM (TEE);  Surgeon: Dixie Dials, MD;  Location: [facility] ENDOSCOPY;  Service: Cardiovascular;  Laterality: N/A;    FAMILY HISTORY: Family History  Problem Relation Age of Onset  . Hypertension Mother   . Heart failure Mother   . Hyperlipidemia Mother   . Heart attack Father     SOCIAL HISTORY:  Social History   Socioeconomic History  . Marital status: Married    Spouse name: Not on file  . Number of children: 6  . Years of education: 16  . Highest education level: Not on file  Occupational History  . Occupation: Retired  Scientific laboratory technician  . Financial resource strain: Not on file  . Food insecurity:    Worry: Not on file    Inability: Not on file  . Transportation needs:    Medical: Not on file    Non-medical: Not on file  Tobacco Use  . Smoking status: Former Smoker    Types: Cigarettes    Last attempt to quit: 04/05/2015    Years since quitting: 2.6  . Smokeless tobacco: Never Used  . Tobacco comment: Quit  04/28/15  Substance and Sexual Activity  . Alcohol use: No    Alcohol/week: 0.0 oz  . Drug use: No  . Sexual activity: Not on file  Lifestyle  . Physical activity:    Days per week: Not on file    Minutes per session: Not on file  . Stress: Not on file  Relationships  . Social connections:    Talks on phone: Not on file    Gets together: Not on file    Attends religious service: Not on file    Active member of club or organization: Not on file    Attends meetings of clubs or organizations: Not on file    Relationship status: Not on file  . Intimate partner violence:    Fear of current or ex partner: Not on file    Emotionally abused: Not on file  Physically abused: Not on file    Forced sexual activity: Not on file  Other Topics Concern  . Not on file  Social History Narrative   She will be living with her two sons.\   Right-handed.   No caffeine use.     PHYSICAL EXAM   Vitals:   12/08/17 1339  BP: 111/71  Pulse: 80    Not recorded      There is no height or weight on file to calculate BMI.  PHYSICAL EXAMNIATION:  Gen: NAD, conversant, well nourised, obese, well groomed                     Cardiovascular: Regular rate rhythm, no peripheral edema, warm, nontender. Eyes: Conjunctivae clear without exudates or hemorrhage Neck: Supple, no carotid bruise. Pulmonary: Clear to auscultation bilaterally   NEUROLOGICAL EXAM:  MENTAL STATUS: Speech/cognition: She has expressive aphasia, following commands, able to onset question by yes or no, not intelligible speech,   CRANIAL NERVES: CN II: Right visual field deficit on double spontaneous stimulation, Pupils are round equal and briskly reactive to light. CN III, IV, VI: extraocular movement are normal. No ptosis. CN V: Facial sensation is intact to pinprick in all 3 divisions bilaterally. Corneal responses are intact.  CN VII: Face is symmetric with normal eye closure and smile. CN VIII: Hearing is normal to  rubbing fingers CN IX, X: Palate elevates symmetrically. Phonation is normal. CN XI: Head turning and shoulder shrug are intact CN XII: Tongue is midline with normal movements and no atrophy.  MOTOR: She only has trace movement of right lower extremity, complains of significant right shoulder pain, limited range of motion of right shoulder, right proximal upper extremity motor strength was 3 and distal strength was 4  REFLEXES: Hyperreflexia on the right arm, right leg, plantar responses are flexor bilaterally  SENSORY: Intact to light touch, pinprick and vibratory sensation  COORDINATION: Rapid alternating movements and fine finger movements are intact. There is no dysmetria on finger-to-nose and heel-knee-shin.    GAIT/STANCE: She has difficulty bearing weight even with assistant   DIAGNOSTIC DATA (LABS, IMAGING, TESTING) - I reviewed patient records, labs, notes, testing and imaging myself where available.   ASSESSMENT AND PLAN  TRENIKA HUDSON is a 72 y.o. female   Complex partial seizure  She has evidence of left frontal encephalomalacia,    Keep Keppra 500 mg twice a day,  Repeat EEG    Vitamin D deficiency  Continue vitamin D supplement   Left ACA stroke, left A2 occlusion with residual spastic right hemiparesis, right shoulder pain  She has evidence of patent foraminal ovale is taking Coumadin  Continue EMG guided botulism toxin injection for spastic right hemiparesis   EMG guided xeomin injection for spastic right upper extremity, right shoulder pain,   Marcial Pacas, M.D. Ph.D.  [facility] [place], [place] [address] Phone: [phone] Fax:      [phone]

## 2017-12-22 ENCOUNTER — Telehealth: Payer: Self-pay | Admitting: Internal Medicine

## 2017-12-22 DIAGNOSIS — I5032 Chronic diastolic (congestive) heart failure: Secondary | ICD-10-CM

## 2017-12-22 NOTE — Telephone Encounter (Signed)
Copied from [place] [id number]. Topic: Referral - Request >> Dec 22, 2017  2:20 PM Conception Chancy, NT wrote: Reason for CRM: patient is seeing a cardiologist outside of  and would like to transfer into [facility]. Can this referral be placed?

## 2017-12-23 DIAGNOSIS — I639 Cerebral infarction, unspecified: Secondary | ICD-10-CM | POA: Diagnosis not present

## 2017-12-23 DIAGNOSIS — I10 Essential (primary) hypertension: Secondary | ICD-10-CM | POA: Diagnosis not present

## 2017-12-23 DIAGNOSIS — G40909 Epilepsy, unspecified, not intractable, without status epilepticus: Secondary | ICD-10-CM | POA: Diagnosis not present

## 2017-12-23 DIAGNOSIS — E119 Type 2 diabetes mellitus without complications: Secondary | ICD-10-CM | POA: Diagnosis not present

## 2017-12-23 DIAGNOSIS — F17290 Nicotine dependence, other tobacco product, uncomplicated: Secondary | ICD-10-CM | POA: Diagnosis not present

## 2017-12-23 DIAGNOSIS — E785 Hyperlipidemia, unspecified: Secondary | ICD-10-CM | POA: Diagnosis not present

## 2017-12-23 NOTE — Telephone Encounter (Signed)
Ok, this is done 

## 2017-12-23 NOTE — Addendum Note (Signed)
Addended by: Biagio Borg on: 12/23/2017 08:10 AM   Modules accepted: Orders

## 2017-12-28 ENCOUNTER — Other Ambulatory Visit: Payer: Self-pay | Admitting: Neurology

## 2017-12-28 MED ORDER — LEVETIRACETAM 500 MG PO TABS: 500.0000 mg | ORAL_TABLET | Freq: Two times a day (BID) | ORAL | 4 refills | Status: DC

## 2017-12-28 NOTE — Telephone Encounter (Signed)
Ok per Dr. Krista Blue to e-scribe rx

## 2017-12-28 NOTE — Telephone Encounter (Signed)
E-sribed rx to [facility] as requested.

## 2017-12-28 NOTE — Telephone Encounter (Signed)
Pts son [name] calling stating that lost the printed RX for levETIRAcetam (KEPPRA) 500 MG tablet requesting a new RX faxed to [facility].

## 2017-12-29 ENCOUNTER — Other Ambulatory Visit: Payer: BC Managed Care – PPO

## 2018-01-11 ENCOUNTER — Ambulatory Visit: Payer: Self-pay | Admitting: Neurology

## 2018-01-11 ENCOUNTER — Telehealth: Payer: Self-pay | Admitting: *Deleted

## 2018-01-11 ENCOUNTER — Encounter: Payer: Self-pay | Admitting: Neurology

## 2018-01-11 NOTE — Telephone Encounter (Signed)
No show - canceled Xeomin appt same day.

## 2018-01-25 ENCOUNTER — Ambulatory Visit (INDEPENDENT_AMBULATORY_CARE_PROVIDER_SITE_OTHER): Payer: Medicare Other | Admitting: Neurology

## 2018-01-25 DIAGNOSIS — I639 Cerebral infarction, unspecified: Secondary | ICD-10-CM | POA: Diagnosis not present

## 2018-01-25 DIAGNOSIS — G40209 Localization-related (focal) (partial) symptomatic epilepsy and epileptic syndromes with complex partial seizures, not intractable, without status epilepticus: Secondary | ICD-10-CM | POA: Diagnosis not present

## 2018-02-08 NOTE — Procedures (Signed)
   HISTORY: 72 years old female, with history of stroke, presented with partial seizure  TECHNIQUE:  16 channel EEG was performed based on standard 10-16 international system. One channel was dedicated to EKG, which has demonstrates normal sinus rhythm of 72 beats per minutes.  Upon awakening, the posterior background activity was dysrhythmic, with mild slowing, theta range activity 5 to 6 Hz, reactive to eye opening and closure.  There was no evidence of epileptiform discharge.  Photic stimulation was performed, which induced a symmetric photic driving.  Hyperventilation was not performed.  No sleep was achieved.  CONCLUSION: This is an abnormal EEG.  There is mild background slowing, consistent with a hemisphere malfunction, common etiology are metabolic toxic.  Marcial Pacas, M.D. Ph.D.  [facility] [place], [place] [address] Phone: [phone] Fax:      [phone]

## 2018-03-08 ENCOUNTER — Ambulatory Visit (INDEPENDENT_AMBULATORY_CARE_PROVIDER_SITE_OTHER): Payer: Medicare Other | Admitting: Neurology

## 2018-03-08 ENCOUNTER — Encounter: Payer: Self-pay | Admitting: Neurology

## 2018-03-08 VITALS — BP 112/78 | HR 87

## 2018-03-08 DIAGNOSIS — G8111 Spastic hemiplegia affecting right dominant side: Secondary | ICD-10-CM

## 2018-03-08 NOTE — Progress Notes (Signed)
Chief Complaint  Patient presents with  . Right Spastic Hemiparesis    She is here with her son, [name]. Xeomin 100 units x 5 vials - office supply      PATIENT: [name] DOB: 06-18-1946   HISTORICAL  [name] is a 72 years old right-handed female, accompanied by her son [name] follow-up emergency room visit on December 03, 2017 for partial seizure,  She had a history of hypertension, diabetes, hyperlipidemia, she suffered left ACA infarction with left A2 occlusion in October 2017, presented with acute onset slurred speech, right leg and arm weakness,    She was found to have patent foramina ovale by TEE in October 2016, was put on Coumadin as stroke prevention  She also suffered right hip fracture require surgery in June 12 2015, She now lives at home with her son [name]  Since stroke, she suffered severe right shoulder pain, right shoulder tightness, limited range of motion, there was also intermittent confusion word finding difficulties more than her baseline level, but there was never seizure activity noted  X-ray of her right shoulder in January 2017 showed no significant abnormality  Previous EEG in May 2017 showed mild background slowing, there was no evidence of epileptiform discharge. Laboratory evaluation in 2017 A1c 8.4, frequent UTIs, INR 1.33-2.5, she supposed to take Coumadin, CMP showed elevated glucose 194, creatinine 0.91,   She begin to receive EMG guided botulism toxin injection for spastic right hemiparesis since April 2018, which has helped relax her right shoulder, last injection was in January 2019,  On December 03, 2017, she was noted by her son [name] slumped over, foaming, out of the mouth, right side body jerking movement, unresponsive, followed by extreme confused, fatigue afterwards,  MRI of brain on December 03, 2017 showed no acute abnormality, old infarction in the left anterior cerebral artery territory, smaller old infarction in the right anterior  cerebral artery,  CMP showed hemoglobin of 16, creatinine of 0.7, glucose of 169  UPDATE Sept 4 2019: EMG guided xeomin injection for spastic right hemiparesis, she tolerated previous injection well, we used 500 units of xeomin today   REVIEW OF SYSTEMS: Full 14 system review of systems performed and notable only for right shoulder pain  ALLERGIES: Allergies  Allergen Reactions  . Pork-Derived Products Other (See Comments)    To keep blood pressure down    HOME MEDICATIONS: Current Outpatient Medications  Medication Sig Dispense Refill  . atorvastatin (LIPITOR) 40 MG tablet Take 1 tablet (40 mg total) by mouth daily. 30 tablet 3  . baclofen (LIORESAL) 10 MG tablet TAKE ONE TABLET BY MOUTH THREE TIMES DAILY 90 tablet 11  . gabapentin (NEURONTIN) 300 MG capsule TAKE ONE CAPSULE BY MOUTH THREE TIMES DAILY 90 capsule 11  . IncobotulinumtoxinA (XEOMIN IM) Inject 500 Units into the muscle every 3 (three) months.    . insulin detemir (LEVEMIR) 100 UNIT/ML injection 40 units sq once daily 10 mL 11  . levETIRAcetam (KEPPRA) 500 MG tablet Take 1 tablet (500 mg total) by mouth 2 (two) times daily. 180 tablet 4  . nitroGLYCERIN (NITROSTAT) 0.4 MG SL tablet Place 0.4 mg under the tongue every 5 (five) minutes as needed for chest pain (up to 3 doses.  If no relief call MD/NP).     Marland Kitchen warfarin (COUMADIN) 5 MG tablet Take 5 mg by mouth daily.      No current facility-administered medications for this visit.     PAST MEDICAL HISTORY: Past Medical History:  Diagnosis Date  . Acute blood loss anemia   . CHF (congestive heart failure) (HCC)    EF 93-71%, grade 1 diastolic dysfunction per echo 04/2015  . 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 CVA (cerebral infarction) 04/2015   Started Plavix 04/2015  . Depression   . Diabetes ([place]) 2016   Type II. On insulin  . Enchondroma of bone 2011   left femur.   . Fracture, intertrochanteric,  right femur ([place])   . History of lower GI bleeding   . Hyperlipidemia   . Hypertension   . Patent foramen ovale 04/2015   Started on warfarin 04/2015  . Protein calorie malnutrition ([place])   . Stercoral ulcer of rectum 05/16/2015    PAST SURGICAL HISTORY: Past Surgical History:  Procedure Laterality Date  . CARDIAC SURGERY     Cath without stent  . COLONOSCOPY N/A 05/15/2015   Procedure: COLONOSCOPY;  Surgeon: Mauri Pole, MD;  Location: [facility] ENDOSCOPY;  Service: Endoscopy;  Laterality: N/A;  . FLEXIBLE SIGMOIDOSCOPY N/A 05/15/2015   Procedure: FLEXIBLE SIGMOIDOSCOPY;  Surgeon: Mauri Pole, MD;  Location: [facility] ENDOSCOPY;  Service: Endoscopy;  Laterality: N/A;  at bedside  . INTRAMEDULLARY (IM) NAIL INTERTROCHANTERIC Right 06/12/2015   Procedure: INTRAMEDULLARY (IM) NAIL RIGHT HIP;  Surgeon: Renette Butters, MD;  Location: [facility];  Service: Orthopedics;  Laterality: Right;  . TEE WITHOUT CARDIOVERSION N/A 05/05/2015   Procedure: TRANSESOPHAGEAL ECHOCARDIOGRAM (TEE);  Surgeon: Dixie Dials, MD;  [facility] ENDOSCOPY;  Service: Cardiovascular;  Laterality: N/A;    FAMILY HISTORY: Family History  Problem Relation Age of Onset  . Hypertension Mother   . Heart failure Mother   . Hyperlipidemia Mother   . Heart attack Father     SOCIAL HISTORY:  Social History   Socioeconomic History  . Marital status: Married    Spouse name: Not on file  . Number of children: 6  . Years of education: 55  . Highest education level: Not on file  Occupational History  . Occupation: Retired  Scientific laboratory technician  . Financial resource strain: Not on file  . Food insecurity:    Worry: Not on file    Inability: Not on file  . Transportation needs:    Medical: Not on file    Non-medical: Not on file  Tobacco Use  . Smoking status: Former Smoker    Types: Cigarettes    Last attempt to quit: 04/05/2015    Years since quitting: 2.9  . Smokeless tobacco: Never Used  . Tobacco comment: Quit  04/28/15  Substance and Sexual Activity  . Alcohol use: No    Alcohol/week: 0.0 standard drinks  . Drug use: No  . Sexual activity: Not on file  Lifestyle  . Physical activity:    Days per week: Not on file    Minutes per session: Not on file  . Stress: Not on file  Relationships  . Social connections:    Talks on phone: Not on file    Gets together: Not on file    Attends religious service: Not on file    Active member of club or organization: Not on file    Attends meetings of clubs or organizations: Not on file    Relationship status: Not on file  . Intimate partner violence:    Fear of current or ex partner: Not on file    Emotionally abused: Not on file    Physically abused: Not  on file    Forced sexual activity: Not on file  Other Topics Concern  . Not on file  Social History Narrative   She will be living with her two sons.\   Right-handed.   No caffeine use.     PHYSICAL EXAM   Vitals:   03/08/18 1455  BP: 112/78  Pulse: 87    Not recorded      There is no height or weight on file to calculate BMI.  PHYSICAL EXAMNIATION:  Gen: NAD, conversant, well nourised, obese, well groomed                     Cardiovascular: Regular rate rhythm, no peripheral edema, warm, nontender. Eyes: Conjunctivae clear without exudates or hemorrhage Neck: Supple, no carotid bruise. Pulmonary: Clear to auscultation bilaterally   NEUROLOGICAL EXAM:  MENTAL STATUS: Speech/cognition: She has expressive aphasia, following commands, able to onset question by yes or no, not intelligible speech,   CRANIAL NERVES: CN II: Right visual field deficit on double spontaneous stimulation, Pupils are round equal and briskly reactive to light. CN III, IV, VI: extraocular movement are normal. No ptosis. CN V: Facial sensation is intact to pinprick in all 3 divisions bilaterally. Corneal responses are intact.  CN VII: Face is symmetric with normal eye closure and smile. CN VIII: Hearing  is normal to rubbing fingers CN IX, X: Palate elevates symmetrically. Phonation is normal. CN XI: Head turning and shoulder shrug are intact CN XII: Tongue is midline with normal movements and no atrophy.  MOTOR: She only has trace movement of right lower extremity, complains of significant right shoulder pain, limited range of motion of right shoulder, right proximal upper extremity motor strength was 3 and distal strength was 4  REFLEXES: Hyperreflexia on the right arm, right leg, plantar responses are flexor bilaterally  SENSORY: Intact to light touch, pinprick and vibratory sensation  COORDINATION: Rapid alternating movements and fine finger movements are intact. There is no dysmetria on finger-to-nose and heel-knee-shin.    GAIT/STANCE: She has difficulty bearing weight even with assistant   DIAGNOSTIC DATA (LABS, IMAGING, TESTING) - I reviewed patient records, labs, notes, testing and imaging myself where available.   ASSESSMENT AND PLAN  [name] is a 72 y.o. female   Complex partial seizure  She has evidence of left frontal encephalomalacia,    Keep Keppra 500 mg twice a day,  Repeat EEG    Vitamin D deficiency  Continue vitamin D supplement   Left ACA stroke, left A2 occlusion with residual spastic right hemiparesis, right shoulder pain  She has evidence of patent foraminal ovale is taking Coumadin  Continue EMG guided xeomin injection for spastic right hemiparesis     500 units of xeomin were used today  Right pectoralis major 50 units Right latissimus dorsi 50 units Right brachialis 100 units Right pronator teres 50 units Right flexor carpi ulnaris 50 units Right flexor digitorum longus 50 units Right flexor digitorum superficialis 50 units Right biceps 50 units Right palmar longus 50 units  Marcial Pacas, M.D. Ph.D.  [facility] [place], [place] [address] Phone: [phone] Fax:      [phone]

## 2018-03-08 NOTE — Progress Notes (Signed)
**  Xeomin 100 units x 5 vials, NDC 0259-1610-01, Lot [id number], Exp 04/2020, office supply.//mck,rn**

## 2018-03-11 DIAGNOSIS — G8111 Spastic hemiplegia affecting right dominant side: Secondary | ICD-10-CM

## 2018-03-11 MED ORDER — INCOBOTULINUMTOXINA 100 UNITS IM SOLR
500.0000 [IU] | INTRAMUSCULAR | Status: DC
  Administered 2018-03-11: 500 [IU] via INTRAMUSCULAR

## 2018-03-23 ENCOUNTER — Encounter: Payer: Self-pay | Admitting: Cardiology

## 2018-03-24 ENCOUNTER — Other Ambulatory Visit: Payer: Self-pay | Admitting: Neurology

## 2018-04-03 NOTE — Progress Notes (Deleted)
Cardiology Office Note   Date:  04/03/2018   ID:  [name], DOB 07-30-1945, MRN [id number]  PCP:  Biagio Borg, MD  Cardiologist:   Peter Martinique, MD   No chief complaint on file.     History of Present Illness: [name] is a 72 y.o. female who is seen at the request of Dr. Jenny Reichmann for evaluation of CAD and diastolic dysfunction. She has a history of HTN, DM, and HLD. She is s/p CVA in 2016 and has a patent foramen ovale. She is on chronic coumadin therapy. Previously followed by Dr. Doylene Canard. She had normal Myoview studies in 2012, 2014, and 2016. She had cardiac caths in 2006 and 2012 showing minimal nonobstructive CAD. TEE in 2016 was normal except for a PFO.     Past Medical History:  Diagnosis Date  . Acute blood loss anemia   . CHF (congestive heart failure) (HCC)    EF 25-00%, grade 1 diastolic dysfunction per echo 04/2015  . 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 CVA (cerebral infarction) 04/2015   Started Plavix 04/2015  . Depression   . Diabetes ([place]) 2016   Type II. On insulin  . Enchondroma of bone 2011   left femur.   . Fracture, intertrochanteric, right femur ([place])   . History of lower GI bleeding   . Hyperlipidemia   . Hypertension   . Patent foramen ovale 04/2015   Started on warfarin 04/2015  . Protein calorie malnutrition ([place])   . Stercoral ulcer of rectum 05/16/2015    Past Surgical History:  Procedure Laterality Date  . CARDIAC SURGERY     Cath without stent  . COLONOSCOPY N/A 05/15/2015   Procedure: COLONOSCOPY;  Surgeon: Mauri Pole, MD;  Location: [facility] ENDOSCOPY;  Service: Endoscopy;  Laterality: N/A;  . FLEXIBLE SIGMOIDOSCOPY N/A 05/15/2015   Procedure: FLEXIBLE SIGMOIDOSCOPY;  Surgeon: Mauri Pole, MD;  Location: [facility] ENDOSCOPY;  Service: Endoscopy;  Laterality: N/A;  at bedside  . INTRAMEDULLARY (IM) NAIL INTERTROCHANTERIC Right 06/12/2015   Procedure:  INTRAMEDULLARY (IM) NAIL RIGHT HIP;  Surgeon: Renette Butters, MD;  Location: [facility];  Service: Orthopedics;  Laterality: Right;  . TEE WITHOUT CARDIOVERSION N/A 05/05/2015   Procedure: TRANSESOPHAGEAL ECHOCARDIOGRAM (TEE);  Surgeon: Dixie Dials, MD;  Location: [facility] ENDOSCOPY;  Service: Cardiovascular;  Laterality: N/A;     Current Outpatient Medications  Medication Sig Dispense Refill  . atorvastatin (LIPITOR) 40 MG tablet Take 1 tablet (40 mg total) by mouth daily. 30 tablet 3  . baclofen (LIORESAL) 10 MG tablet TAKE ONE TABLET BY MOUTH THREE TIMES DAILY 90 tablet 11  . gabapentin (NEURONTIN) 300 MG capsule TAKE 1 CAPSULE BY MOUTH THREE TIMES DAILY 90 capsule 11  . IncobotulinumtoxinA (XEOMIN IM) Inject 500 Units into the muscle every 3 (three) months.    . insulin detemir (LEVEMIR) 100 UNIT/ML injection 40 units sq once daily 10 mL 11  . levETIRAcetam (KEPPRA) 500 MG tablet Take 1 tablet (500 mg total) by mouth 2 (two) times daily. 180 tablet 4  . nitroGLYCERIN (NITROSTAT) 0.4 MG SL tablet Place 0.4 mg under the tongue every 5 (five) minutes as needed for chest pain (up to 3 doses.  If no relief call MD/NP).     Marland Kitchen warfarin (COUMADIN) 5 MG tablet Take 5 mg by mouth daily.      Current Facility-Administered Medications  Medication Dose Route Frequency Provider Last Rate Last  Dose  . incobotulinumtoxinA (XEOMIN) 100 units injection 500 Units  500 Units Intramuscular Q90 days Marcial Pacas, MD   500 Units at 03/11/18 2241    Allergies:   Pork-derived products    Social History:  The patient  reports that she quit smoking about 2 years ago. Her smoking use included cigarettes. She has never used smokeless tobacco. She reports that she does not drink alcohol or use drugs.   Family History:  The patient's ***family history includes Heart attack in her father; Heart failure in her mother; Hyperlipidemia in her mother; Hypertension in her mother.    ROS:  Please see the history of present  illness.   Otherwise, review of systems are positive for {NONE DEFAULTED:18576::"none"}.   All other systems are reviewed and negative.    PHYSICAL EXAM: VS:  There were no vitals taken for this visit. , BMI There is no height or weight on file to calculate BMI. GEN: Well nourished, well developed, in no acute distress  HEENT: normal  Neck: no JVD, carotid bruits, or masses Cardiac: ***RRR; no murmurs, rubs, or gallops,no edema  Respiratory:  clear to auscultation bilaterally, normal work of breathing GI: soft, nontender, nondistended, + BS MS: no deformity or atrophy  Skin: warm and dry, no rash Neuro:  Strength and sensation are intact Psych: euthymic mood, full affect   EKG:  EKG {ACTION; IS/IS WNU:27253664} ordered today. The ekg ordered today demonstrates ***   Recent Labs: 10/26/2017: TSH 1.87 12/03/2017: ALT 21; BUN 10; Creatinine, Ser 0.70; Hemoglobin 16.0; Platelets 233; Potassium 4.0; Sodium 143    Lipid Panel    Component Value Date/Time   CHOL 136 10/26/2017 1505   TRIG 382.0 (H) 10/26/2017 1505   HDL 43.20 10/26/2017 1505   CHOLHDL 3 10/26/2017 1505   VLDL 76.4 (H) 10/26/2017 1505   LDLCALC 121 (H) 04/29/2015 0439   LDLDIRECT 61.0 10/26/2017 1505      Wt Readings from Last 3 Encounters:  10/26/17 156 lb (70.8 kg)  07/20/17 152 lb (68.9 kg)  10/12/16 154 lb (69.9 kg)      Other studies Reviewed: Additional studies/ records that were reviewed today include: ***. Review of the above records demonstrates: ***   ASSESSMENT AND PLAN:  1.  ***   Current medicines are reviewed at length with the patient today.  The patient {ACTIONS; HAS/DOES NOT HAVE:19233} concerns regarding medicines.  The following changes have been made:  {PLAN; NO CHANGE:13088:s}  Labs/ tests ordered today include: *** No orders of the defined types were placed in this encounter.    Disposition:   FU with *** in {gen number 4-03:474259} {Days to years:10300}  Signed, Peter  Martinique, MD  04/03/2018 7:50 AM    [facility] [address] [place], [place], [address] Phone [phone], Fax [phone]

## 2018-04-05 ENCOUNTER — Ambulatory Visit: Payer: Medicare Other | Admitting: Cardiology

## 2018-04-06 ENCOUNTER — Encounter: Payer: Self-pay | Admitting: *Deleted

## 2018-04-19 ENCOUNTER — Ambulatory Visit: Payer: Medicare Other | Admitting: Neurology

## 2018-04-21 ENCOUNTER — Ambulatory Visit: Payer: Medicare Other | Admitting: Internal Medicine

## 2018-04-21 DIAGNOSIS — Z0289 Encounter for other administrative examinations: Secondary | ICD-10-CM

## 2018-04-26 DIAGNOSIS — G40909 Epilepsy, unspecified, not intractable, without status epilepticus: Secondary | ICD-10-CM | POA: Diagnosis not present

## 2018-04-26 DIAGNOSIS — I10 Essential (primary) hypertension: Secondary | ICD-10-CM | POA: Diagnosis not present

## 2018-04-26 DIAGNOSIS — F17290 Nicotine dependence, other tobacco product, uncomplicated: Secondary | ICD-10-CM | POA: Diagnosis not present

## 2018-04-26 DIAGNOSIS — I639 Cerebral infarction, unspecified: Secondary | ICD-10-CM | POA: Diagnosis not present

## 2018-04-26 DIAGNOSIS — Z87820 Personal history of traumatic brain injury: Secondary | ICD-10-CM | POA: Diagnosis not present

## 2018-04-26 DIAGNOSIS — E119 Type 2 diabetes mellitus without complications: Secondary | ICD-10-CM | POA: Diagnosis not present

## 2018-04-26 DIAGNOSIS — Z7901 Long term (current) use of anticoagulants: Secondary | ICD-10-CM | POA: Diagnosis not present

## 2018-04-26 DIAGNOSIS — E785 Hyperlipidemia, unspecified: Secondary | ICD-10-CM | POA: Diagnosis not present

## 2018-06-13 ENCOUNTER — Telehealth: Payer: Self-pay

## 2018-06-13 NOTE — Telephone Encounter (Signed)
Patient's caregiver Nicole Kindred called stating that Mrs. Templeman would not be able to make her appointment tomorrow due to him having to work. He stated that he would need a morning appointment. I was unable to reschedule his appointment due to her schedule being full. Please call patient back to reschedule.

## 2018-06-13 NOTE — Telephone Encounter (Signed)
The patient was scheduled for Xeomin injections.  I returned the call to her son and left message requesting a call back to get her rescheduled.  When he calls back, please offer a morning time.  New patient appts, follow ups and RN work-in slots are okay to use for her this time.

## 2018-06-14 ENCOUNTER — Ambulatory Visit: Payer: BC Managed Care – PPO | Admitting: Neurology

## 2018-07-18 ENCOUNTER — Ambulatory Visit: Payer: Medicare Other | Admitting: Internal Medicine

## 2018-07-19 ENCOUNTER — Ambulatory Visit: Payer: Medicare Other | Admitting: Internal Medicine

## 2018-08-02 ENCOUNTER — Ambulatory Visit: Payer: BC Managed Care – PPO | Admitting: Neurology

## 2018-08-16 DIAGNOSIS — F17290 Nicotine dependence, other tobacco product, uncomplicated: Secondary | ICD-10-CM | POA: Diagnosis not present

## 2018-08-16 DIAGNOSIS — I10 Essential (primary) hypertension: Secondary | ICD-10-CM | POA: Diagnosis not present

## 2018-08-16 DIAGNOSIS — Z87820 Personal history of traumatic brain injury: Secondary | ICD-10-CM | POA: Diagnosis not present

## 2018-08-16 DIAGNOSIS — E785 Hyperlipidemia, unspecified: Secondary | ICD-10-CM | POA: Diagnosis not present

## 2018-08-16 DIAGNOSIS — G40909 Epilepsy, unspecified, not intractable, without status epilepticus: Secondary | ICD-10-CM | POA: Diagnosis not present

## 2018-08-16 DIAGNOSIS — E119 Type 2 diabetes mellitus without complications: Secondary | ICD-10-CM | POA: Diagnosis not present

## 2018-08-18 ENCOUNTER — Encounter: Payer: Self-pay | Admitting: Internal Medicine

## 2018-08-18 ENCOUNTER — Other Ambulatory Visit: Payer: Medicare Other

## 2018-08-18 ENCOUNTER — Ambulatory Visit (INDEPENDENT_AMBULATORY_CARE_PROVIDER_SITE_OTHER): Payer: Medicare Other | Admitting: *Deleted

## 2018-08-18 ENCOUNTER — Other Ambulatory Visit (INDEPENDENT_AMBULATORY_CARE_PROVIDER_SITE_OTHER): Payer: Medicare Other

## 2018-08-18 ENCOUNTER — Other Ambulatory Visit: Payer: Self-pay | Admitting: Internal Medicine

## 2018-08-18 ENCOUNTER — Ambulatory Visit (INDEPENDENT_AMBULATORY_CARE_PROVIDER_SITE_OTHER): Payer: Medicare Other | Admitting: Internal Medicine

## 2018-08-18 ENCOUNTER — Telehealth: Payer: Self-pay | Admitting: Internal Medicine

## 2018-08-18 VITALS — BP 118/78 | HR 60 | Ht 66.0 in | Wt 154.6 lb

## 2018-08-18 VITALS — BP 118/78 | HR 60 | Temp 98.0°F | Ht 66.0 in

## 2018-08-18 DIAGNOSIS — Z23 Encounter for immunization: Secondary | ICD-10-CM

## 2018-08-18 DIAGNOSIS — E538 Deficiency of other specified B group vitamins: Secondary | ICD-10-CM | POA: Diagnosis not present

## 2018-08-18 DIAGNOSIS — E559 Vitamin D deficiency, unspecified: Secondary | ICD-10-CM

## 2018-08-18 DIAGNOSIS — Z0000 Encounter for general adult medical examination without abnormal findings: Secondary | ICD-10-CM

## 2018-08-18 DIAGNOSIS — E785 Hyperlipidemia, unspecified: Secondary | ICD-10-CM | POA: Diagnosis not present

## 2018-08-18 DIAGNOSIS — I10 Essential (primary) hypertension: Secondary | ICD-10-CM

## 2018-08-18 DIAGNOSIS — G8111 Spastic hemiplegia affecting right dominant side: Secondary | ICD-10-CM | POA: Diagnosis not present

## 2018-08-18 DIAGNOSIS — E1159 Type 2 diabetes mellitus with other circulatory complications: Secondary | ICD-10-CM

## 2018-08-18 DIAGNOSIS — Z7901 Long term (current) use of anticoagulants: Secondary | ICD-10-CM | POA: Diagnosis not present

## 2018-08-18 DIAGNOSIS — E119 Type 2 diabetes mellitus without complications: Secondary | ICD-10-CM | POA: Diagnosis not present

## 2018-08-18 DIAGNOSIS — Z1159 Encounter for screening for other viral diseases: Secondary | ICD-10-CM

## 2018-08-18 DIAGNOSIS — E2839 Other primary ovarian failure: Secondary | ICD-10-CM | POA: Diagnosis not present

## 2018-08-18 LAB — CBC WITH DIFFERENTIAL/PLATELET
Basophils Absolute: 0.1 10*3/uL (ref 0.0–0.1)
Basophils Relative: 1.1 % (ref 0.0–3.0)
Eosinophils Absolute: 0.1 10*3/uL (ref 0.0–0.7)
Eosinophils Relative: 1.4 % (ref 0.0–5.0)
HCT: 48.8 % — ABNORMAL HIGH (ref 36.0–46.0)
Hemoglobin: 16.2 g/dL — ABNORMAL HIGH (ref 12.0–15.0)
Lymphocytes Relative: 22.9 % (ref 12.0–46.0)
Lymphs Abs: 2.4 10*3/uL (ref 0.7–4.0)
MCHC: 33.1 g/dL (ref 30.0–36.0)
MCV: 85.4 fl (ref 78.0–100.0)
MONO ABS: 0.6 10*3/uL (ref 0.1–1.0)
Monocytes Relative: 5.4 % (ref 3.0–12.0)
NEUTROS ABS: 7.3 10*3/uL (ref 1.4–7.7)
Neutrophils Relative %: 69.2 % (ref 43.0–77.0)
PLATELETS: 264 10*3/uL (ref 150.0–400.0)
RBC: 5.72 Mil/uL — ABNORMAL HIGH (ref 3.87–5.11)
RDW: 13.4 % (ref 11.5–15.5)
WBC: 10.5 10*3/uL (ref 4.0–10.5)

## 2018-08-18 LAB — HEPATIC FUNCTION PANEL
ALT: 23 U/L (ref 0–35)
AST: 21 U/L (ref 0–37)
Albumin: 4.3 g/dL (ref 3.5–5.2)
Alkaline Phosphatase: 107 U/L (ref 39–117)
BILIRUBIN TOTAL: 0.5 mg/dL (ref 0.2–1.2)
Bilirubin, Direct: 0.1 mg/dL (ref 0.0–0.3)
Total Protein: 7.4 g/dL (ref 6.0–8.3)

## 2018-08-18 LAB — BASIC METABOLIC PANEL
BUN: 11 mg/dL (ref 6–23)
CO2: 26 mEq/L (ref 19–32)
Calcium: 9.6 mg/dL (ref 8.4–10.5)
Chloride: 101 mEq/L (ref 96–112)
Creatinine, Ser: 0.86 mg/dL (ref 0.40–1.20)
GFR: 78.05 mL/min (ref >60.00)
Glucose, Bld: 199 mg/dL — ABNORMAL HIGH (ref 70–99)
Potassium: 4.5 mEq/L (ref 3.5–5.1)
Sodium: 140 mEq/L (ref 135–145)

## 2018-08-18 LAB — LIPID PANEL
Cholesterol: 152 mg/dL (ref 0–200)
HDL: 48.9 mg/dL (ref >39.00)
LDL Cholesterol: 70 mg/dL (ref 0–99)
NonHDL: 102.76
Total CHOL/HDL Ratio: 3
Triglycerides: 165 mg/dL — ABNORMAL HIGH (ref 0.0–149.0)
VLDL: 33 mg/dL (ref 0.0–40.0)

## 2018-08-18 LAB — VITAMIN D 25 HYDROXY (VIT D DEFICIENCY, FRACTURES): VITD: 10.13 ng/mL — ABNORMAL LOW (ref 30.00–100.00)

## 2018-08-18 LAB — TSH: TSH: 1.74 u[IU]/mL (ref 0.35–4.50)

## 2018-08-18 LAB — VITAMIN B12: Vitamin B-12: 245 pg/mL (ref 211–911)

## 2018-08-18 LAB — HEMOGLOBIN A1C: Hgb A1c MFr Bld: 10.8 % — ABNORMAL HIGH (ref 4.6–6.5)

## 2018-08-18 MED ORDER — INSULIN DETEMIR 100 UNIT/ML ~~LOC~~ SOLN: SUBCUTANEOUS | 11 refills | Status: DC

## 2018-08-18 MED ORDER — VITAMIN D (ERGOCALCIFEROL) 1.25 MG (50000 UNIT) PO CAPS: 50000.0000 [IU] | ORAL_CAPSULE | ORAL | 0 refills | Status: DC

## 2018-08-18 NOTE — Patient Instructions (Signed)
[facility] in [place], [place] Address: [address], [place], [place] [address]                      Phone: [phone]  Call to see if insurance will cover medical supplies  Continue doing brain stimulating activities (puzzles, reading, adult coloring books, staying active) to keep memory sharp.   Continue to eat heart healthy diet (full of fruits, vegetables, whole grains, lean protein, water--limit salt, fat, and sugar intake) and increase physical activity as tolerated.  Health Maintenance, Female Adopting a healthy lifestyle and getting preventive care can go a long way to promote health and wellness. Talk with your health care provider about what schedule of regular examinations is right for you. This is a good chance for you to check in with your provider about disease prevention and staying healthy. In between checkups, there are plenty of things you can do on your own. Experts have done a lot of research about which lifestyle changes and preventive measures are most likely to keep you healthy. Ask your health care provider for more information. Weight and diet Eat a healthy diet  Be sure to include plenty of vegetables, fruits, low-fat dairy products, and lean protein.  Do not eat a lot of foods high in solid fats, added sugars, or salt.  Get regular exercise. This is one of the most important things you can do for your health. ? Most adults should exercise for at least 150 minutes each week. The exercise should increase your heart rate and make you sweat (moderate-intensity exercise). ? Most adults should also do strengthening exercises at least twice a week. This is in addition to the moderate-intensity exercise. Maintain a healthy weight  Body mass index (BMI) is a measurement that can be used to identify possible weight problems. It estimates body fat based on height and weight. Your health care provider can help determine your BMI and  help you achieve or maintain a healthy weight.  For females 74 years of age and older: ? A BMI below 18.5 is considered underweight. ? A BMI of 18.5 to 24.9 is normal. ? A BMI of 25 to 29.9 is considered overweight. ? A BMI of 30 and above is considered obese. Watch levels of cholesterol and blood lipids  You should start having your blood tested for lipids and cholesterol at 74 years of age, then have this test every 5 years.  You may need to have your cholesterol levels checked more often if: ? Your lipid or cholesterol levels are high. ? You are older than 74 years of age. ? You are at high risk for heart disease. Cancer screening Lung Cancer  Lung cancer screening is recommended for adults 74-3 years old who are at high risk for lung cancer because of a history of smoking.  A yearly low-dose CT scan of the lungs is recommended for people who: ? Currently smoke. ? Have quit within the past 15 years. ? Have at least a 30-pack-year history of smoking. A pack year is smoking an average of one pack of cigarettes a day for 1 year.  Yearly screening should continue until it has been 15 years since you quit.  Yearly screening should stop if you develop a health problem that would prevent you from having lung cancer treatment. Breast Cancer  Practice breast self-awareness. This means understanding how your breasts normally appear and feel.  It also means doing regular breast  self-exams. Let your health care provider know about any changes, no matter how small.  If you are in your 74s or 30s, you should have a clinical breast exam (CBE) by a health care provider every 1-3 years as part of a regular health exam.  If you are 74 or older, have a CBE every year. Also consider having a breast X-ray (mammogram) every year.  If you have a family history of breast cancer, talk to your health care provider about genetic screening.  If you are at high risk for breast cancer, talk to your  health care provider about having an MRI and a mammogram every year.  Breast cancer gene (BRCA) assessment is recommended for women who have family members with BRCA-related cancers. BRCA-related cancers include: ? Breast. ? Ovarian. ? Tubal. ? Peritoneal cancers.  Results of the assessment will determine the need for genetic counseling and BRCA1 and BRCA2 testing. Cervical Cancer Your health care provider may recommend that you be screened regularly for cancer of the pelvic organs (ovaries, uterus, and vagina). This screening involves a pelvic examination, including checking for microscopic changes to the surface of your cervix (Pap test). You may be encouraged to have this screening done every 3 years, beginning at age 79.  For women ages 46-65, health care providers may recommend pelvic exams and Pap testing every 3 years, or they may recommend the Pap and pelvic exam, combined with testing for human papilloma virus (HPV), every 5 years. Some types of HPV increase your risk of cervical cancer. Testing for HPV may also be done on women of any age with unclear Pap test results.  Other health care providers may not recommend any screening for nonpregnant women who are considered low risk for pelvic cancer and who do not have symptoms. Ask your health care provider if a screening pelvic exam is right for you.  If you have had past treatment for cervical cancer or a condition that could lead to cancer, you need Pap tests and screening for cancer for at least 20 years after your treatment. If Pap tests have been discontinued, your risk factors (such as having a new sexual partner) need to be reassessed to determine if screening should resume. Some women have medical problems that increase the chance of getting cervical cancer. In these cases, your health care provider may recommend more frequent screening and Pap tests. Colorectal Cancer  This type of cancer can be detected and often  prevented.  Routine colorectal cancer screening usually begins at 74 years of age and continues through 74 years of age.  Your health care provider may recommend screening at an earlier age if you have risk factors for colon cancer.  Your health care provider may also recommend using home test kits to check for hidden blood in the stool.  A small camera at the end of a tube can be used to examine your colon directly (sigmoidoscopy or colonoscopy). This is done to check for the earliest forms of colorectal cancer.  Routine screening usually begins at age 68.  Direct examination of the colon should be repeated every 5-10 years through 74 years of age. However, you may need to be screened more often if early forms of precancerous polyps or small growths are found. Skin Cancer  Check your skin from head to toe regularly.  Tell your health care provider about any new moles or changes in moles, especially if there is a change in a mole's shape or color.  Also  tell your health care provider if you have a mole that is larger than the size of a pencil eraser.  Always use sunscreen. Apply sunscreen liberally and repeatedly throughout the day.  Protect yourself by wearing long sleeves, pants, a wide-brimmed hat, and sunglasses whenever you are outside. Heart disease, diabetes, and high blood pressure  High blood pressure causes heart disease and increases the risk of stroke. High blood pressure is more likely to develop in: ? People who have blood pressure in the high end of the normal range (130-139/85-89 mm Hg). ? People who are overweight or obese. ? People who are African American.  If you are 22-19 years of age, have your blood pressure checked every 3-5 years. If you are 7 years of age or older, have your blood pressure checked every year. You should have your blood pressure measured twice-once when you are at a hospital or clinic, and once when you are not at a hospital or clinic. Record  the average of the two measurements. To check your blood pressure when you are not at a hospital or clinic, you can use: ? An automated blood pressure machine at a pharmacy. ? A home blood pressure monitor.  If you are between 53 years and 23 years old, ask your health care provider if you should take aspirin to prevent strokes.  Have regular diabetes screenings. This involves taking a blood sample to check your fasting blood sugar level. ? If you are at a normal weight and have a low risk for diabetes, have this test once every three years after 74 years of age. ? If you are overweight and have a high risk for diabetes, consider being tested at a younger age or more often. Preventing infection Hepatitis B  If you have a higher risk for hepatitis B, you should be screened for this virus. You are considered at high risk for hepatitis B if: ? You were born in a country where hepatitis B is common. Ask your health care provider which countries are considered high risk. ? Your parents were born in a high-risk country, and you have not been immunized against hepatitis B (hepatitis B vaccine). ? You have HIV or AIDS. ? You use needles to inject street drugs. ? You live with someone who has hepatitis B. ? You have had sex with someone who has hepatitis B. ? You get hemodialysis treatment. ? You take certain medicines for conditions, including cancer, organ transplantation, and autoimmune conditions. Hepatitis C  Blood testing is recommended for: ? Everyone born from [birth year] through 1966. ? Anyone with known risk factors for hepatitis C. Sexually transmitted infections (STIs)  You should be screened for sexually transmitted infections (STIs) including gonorrhea and chlamydia if: ? You are sexually active and are younger than 74 years of age. ? You are older than 74 years of age and your health care provider tells you that you are at risk for this type of infection. ? Your sexual activity has  changed since you were last screened and you are at an increased risk for chlamydia or gonorrhea. Ask your health care provider if you are at risk.  If you do not have HIV, but are at risk, it may be recommended that you take a prescription medicine daily to prevent HIV infection. This is called pre-exposure prophylaxis (PrEP). You are considered at risk if: ? You are sexually active and do not regularly use condoms or know the HIV status of your partner(s). ? You  take drugs by injection. ? You are sexually active with a partner who has HIV. Talk with your health care provider about whether you are at high risk of being infected with HIV. If you choose to begin PrEP, you should first be tested for HIV. You should then be tested every 3 months for as long as you are taking PrEP. Pregnancy  If you are premenopausal and you may become pregnant, ask your health care provider about preconception counseling.  If you may become pregnant, take 400 to 800 micrograms (mcg) of folic acid every day.  If you want to prevent pregnancy, talk to your health care provider about birth control (contraception). Osteoporosis and menopause  Osteoporosis is a disease in which the bones lose minerals and strength with aging. This can result in serious bone fractures. Your risk for osteoporosis can be identified using a bone density scan.  If you are 64 years of age or older, or if you are at risk for osteoporosis and fractures, ask your health care provider if you should be screened.  Ask your health care provider whether you should take a calcium or vitamin D supplement to lower your risk for osteoporosis.  Menopause may have certain physical symptoms and risks.  Hormone replacement therapy may reduce some of these symptoms and risks. Talk to your health care provider about whether hormone replacement therapy is right for you. Follow these instructions at home:  Schedule regular health, dental, and eye  exams.  Stay current with your immunizations.  Do not use any tobacco products including cigarettes, chewing tobacco, or electronic cigarettes.  If you are pregnant, do not drink alcohol.  If you are breastfeeding, limit how much and how often you drink alcohol.  Limit alcohol intake to no more than 1 drink per day for nonpregnant women. One drink equals 12 ounces of beer, 5 ounces of , or 1 ounces of hard liquor.  Do not use street drugs.  Do not share needles.  Ask your health care provider for help if you need support or information about quitting drugs.  Tell your health care provider if you often feel depressed.  Tell your health care provider if you have ever been abused or do not feel safe at home. This information is not intended to replace advice given to you by your health care provider. Make sure you discuss any questions you have with your health care provider. Document Released: 01/04/2011 Document Revised: 11/27/2015 Document Reviewed: 03/25/2015 Elsevier Interactive Patient Education  2019 [employer].

## 2018-08-18 NOTE — Progress Notes (Signed)
Subjective:    Patient ID: [name], female    DOB: 12/28/1945, 73 y.o.   MRN: [id number]  HPI  Here for wellness and f/u;  Overall doing ok;  Pt denies Chest pain, worsening SOB, DOE, wheezing, orthopnea, PND, worsening LE edema, palpitations, dizziness or syncope.  Pt denies new neurological change such as new headache, facial or worsening extremity weakness.  Pt denies polydipsia, polyuria, or low sugar symptoms. Pt states overall good compliance with treatment and medications, good tolerability, and has been trying to follow appropriate diet.  Pt denies worsening depressive symptoms, suicidal ideation or panic. No fever, night sweats, wt loss, loss of appetite, or other constitutional symptoms.  Pt states stable but poor ability with ADL's, has high fall risk but no falls in the past year, home safety reviewed and adequate, no other significant changes in hearing or vision, and not active with exercise, remains wheelchair bound but well supported by attentive family.  Due for DXA, flu shot, new wheelchair and f/u labs  No new complaints Past Medical History:  Diagnosis Date  . Acute blood loss anemia   . CHF (congestive heart failure) (HCC)    EF 85-02%, grade 1 diastolic dysfunction per echo 04/2015  . 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 CVA (cerebral infarction) 04/2015   Started Plavix 04/2015  . Depression   . Diabetes ([place]) 2016   Type II. On insulin  . Enchondroma of bone 2011   left femur.   . Fracture, intertrochanteric, right femur ([place])   . History of lower GI bleeding   . Hyperlipidemia   . Hypertension   . Patent foramen ovale 04/2015   Started on warfarin 04/2015  . Protein calorie malnutrition ([place])   . Stercoral ulcer of rectum 05/16/2015   Past Surgical History:  Procedure Laterality Date  . CARDIAC SURGERY     Cath without stent  . COLONOSCOPY N/A 05/15/2015   Procedure: COLONOSCOPY;  Surgeon:  Mauri Pole, MD;  Location: [facility] ENDOSCOPY;  Service: Endoscopy;  Laterality: N/A;  . FLEXIBLE SIGMOIDOSCOPY N/A 05/15/2015   Procedure: FLEXIBLE SIGMOIDOSCOPY;  Surgeon: Mauri Pole, MD;  Location: [place] ENDOSCOPY;  Service: Endoscopy;  Laterality: N/A;  at bedside  . INTRAMEDULLARY (IM) NAIL INTERTROCHANTERIC Right 06/12/2015   Procedure: INTRAMEDULLARY (IM) NAIL RIGHT HIP;  Surgeon: Renette Butters, MD;  Location: [place];  Service: Orthopedics;  Laterality: Right;  . TEE WITHOUT CARDIOVERSION N/A 05/05/2015   Procedure: TRANSESOPHAGEAL ECHOCARDIOGRAM (TEE);  Surgeon: Dixie Dials, MD;  Location: [facility] ENDOSCOPY;  Service: Cardiovascular;  Laterality: N/A;    reports that she quit smoking about 3 years ago. Her smoking use included cigarettes. She has never used smokeless tobacco. She reports that she does not drink alcohol or use drugs. family history includes Heart attack in her father; Heart failure in her mother; Hyperlipidemia in her mother; Hypertension in her mother. Allergies  Allergen Reactions  . Pork-Derived Products Other (See Comments)    To keep blood pressure down   Current Outpatient Medications on File Prior to Visit  Medication Sig Dispense Refill  . atorvastatin (LIPITOR) 40 MG tablet Take 1 tablet (40 mg total) by mouth daily. 30 tablet 3  . baclofen (LIORESAL) 10 MG tablet TAKE ONE TABLET BY MOUTH THREE TIMES DAILY 90 tablet 11  . gabapentin (NEURONTIN) 300 MG capsule TAKE 1 CAPSULE BY MOUTH THREE TIMES DAILY 90 capsule 11  . IncobotulinumtoxinA (XEOMIN IM) Inject  500 Units into the muscle every 3 (three) months.    . insulin detemir (LEVEMIR) 100 UNIT/ML injection 40 units sq once daily 10 mL 11  . levETIRAcetam (KEPPRA) 500 MG tablet Take 1 tablet (500 mg total) by mouth 2 (two) times daily. 180 tablet 4  . nitroGLYCERIN (NITROSTAT) 0.4 MG SL tablet Place 0.4 mg under the tongue every 5 (five) minutes as needed for chest pain (up to 3 doses.  If no relief call  MD/NP).     Marland Kitchen warfarin (COUMADIN) 5 MG tablet Take 5 mg by mouth daily.      Current Facility-Administered Medications on File Prior to Visit  Medication Dose Route Frequency Provider Last Rate Last Dose  . incobotulinumtoxinA (XEOMIN) 100 units injection 500 Units  500 Units Intramuscular Q90 days Marcial Pacas, MD   500 Units at 03/11/18 2241   Review of Systems Constitutional: Negative for other unusual diaphoresis, sweats, appetite or weight changes HENT: Negative for other worsening hearing loss, ear pain, facial swelling, mouth sores or neck stiffness.   Eyes: Negative for other worsening pain, redness or other visual disturbance.  Respiratory: Negative for other stridor or swelling Cardiovascular: Negative for other palpitations or other chest pain  Gastrointestinal: Negative for worsening diarrhea or loose stools, blood in stool, distention or other pain Genitourinary: Negative for hematuria, flank pain or other change in urine volume.  Musculoskeletal: Negative for myalgias or other joint swelling.  Skin: Negative for other color change, or other wound or worsening drainage.  Neurological: Negative for other syncope or numbness. Hematological: Negative for other adenopathy or swelling Psychiatric/Behavioral: Negative for hallucinations, other worsening agitation, SI, self-injury, or new decreased concentration All other system neg per pt    Objective:   Physical Exam BP 118/78 (BP Location: Left Arm, Patient Position: Sitting, Cuff Size: Normal)   Pulse (!) 160   Temp 98 F (36.7 C) (Oral)   Ht 5\' 6"  (1.676 m)   SpO2 97%   BMI 25.18 kg/m  Repeat HR after resting at 60 with o2 sat 99 VS noted,  Constitutional: Pt is oriented to person, place, and time. Appears well-developed and well-nourished, in no significant distress and comfortable Head: Normocephalic and atraumatic  Eyes: Conjunctivae and EOM are normal. Pupils are equal, round, and reactive to light Right Ear:  External ear normal without discharge Left Ear: External ear normal without discharge Nose: Nose without discharge or deformity Mouth/Throat: Oropharynx is without other ulcerations and moist  Neck: Normal range of motion. Neck supple. No JVD present. No tracheal deviation present or significant neck LA or mass Cardiovascular: Normal rate, regular rhythm, normal heart sounds and intact distal pulses.   Pulmonary/Chest: WOB normal and breath sounds without rales or wheezing  Abdominal: Soft. Bowel sounds are normal. NT. No HSM  Musculoskeletal: Normal range of motion. Exhibits no edema Lymphadenopathy: Has no other cervical adenopathy.  Neurological: Pt is alert and oriented to person, place,. Pt has normal reflexes except right hyperreflexive with right spastic hemiparesis. Motor o/w grossly intact, Gait - unable to stand to walk Skin: Skin is warm and dry. No rash noted or new ulcerations Psychiatric:  Has normal mood and affect. Behavior is normal without agitation No other exam findings  Lab Results  Component Value Date   WBC 8.7 12/03/2017   HGB 16.0 (H) 12/03/2017   HCT 47.0 (H) 12/03/2017   PLT 233 12/03/2017   GLUCOSE 169 (H) 12/03/2017   CHOL 136 10/26/2017   TRIG 382.0 (H) 10/26/2017  HDL 43.20 10/26/2017   LDLDIRECT 61.0 10/26/2017   LDLCALC 121 (H) 04/29/2015   ALT 21 12/03/2017   AST 26 12/03/2017   NA 143 12/03/2017   K 4.0 12/03/2017   CL 107 12/03/2017   CREATININE 0.70 12/03/2017   BUN 10 12/03/2017   CO2 25 12/03/2017   TSH 1.87 10/26/2017   INR 1.33 11/14/2015   HGBA1C 12.0 (H) 10/26/2017   MICROALBUR 2.3 (H) 11/04/2017       Assessment & Plan:

## 2018-08-18 NOTE — Assessment & Plan Note (Signed)
stable overall by history and exam, recent data reviewed with pt, and pt to continue medical treatment as before,  to f/u any worsening symptoms or concerns, for f/u lab today 

## 2018-08-18 NOTE — Telephone Encounter (Signed)
Copied from [place] [id number]. Topic: Quick Communication - See Telephone Encounter >> Aug 18, 2018  5:02 PM Antonieta Iba C wrote: CRM for notification. See Telephone encounter for: 08/18/18.  Pt/ pt's son returned call. Showing that Jonelle Sidle called pt for results. Pt hung up while I was getting someone on the phone to give results.   Please call pt to advise.

## 2018-08-18 NOTE — Assessment & Plan Note (Signed)
stable overall by history and exam, recent data reviewed with pt, and pt to continue medical treatment as before,  to f/u any worsening symptoms or concerns  

## 2018-08-18 NOTE — Patient Instructions (Addendum)
You had the flu shot today  Please schedule the bone density test before leaving today at the scheduling desk (where you check out)  You are given the prescription for the wheelchair  Please continue all other medications as before, and refills have been done if requested.  Please have the pharmacy call with any other refills you may need.  Please continue your efforts at being more active, low cholesterol diet, and weight control.  You are otherwise up to date with prevention measures today.  Please keep your appointments with your specialists as you may have planned  Please go to the LAB in the Basement (turn left off the elevator) for the tests to be done today  You will be contacted by phone if any changes need to be made immediately.  Otherwise, you will receive a letter about your results with an explanation, but please check with MyChart first.  Please remember to sign up for MyChart if you have not done so, as this will be important to you in the future with finding out test results, communicating by private email, and scheduling acute appointments online when needed.  Please return in 6 months, or sooner if needed

## 2018-08-18 NOTE — Addendum Note (Signed)
Addended by: Karren Cobble on: 08/18/2018 09:39 AM   Modules accepted: Orders

## 2018-08-18 NOTE — Progress Notes (Addendum)
Subjective:   [name] is a 73 y.o. female who presents for an Initial Medicare Annual Wellness Visit.  Review of Systems    No ROS.  Medicare Wellness Visit. Additional risk factors are reflected in the social history.  Cardiac Risk Factors include: advanced age (>90men, >33 women);diabetes mellitus;hypertension;sedentary lifestyle Sleep patterns: feels rested on waking and sleeps 7-8 hours nightly.    Home Safety/Smoke Alarms: Feels safe in home. Smoke alarms in place.  Living environment; residence and Firearm Safety: 1-story house/ trailer, equipment: Hydrologist, Type: Tub Surveyor, quantity, wheelchair and needs replacement commode. Lives with son,  needs replacement commode DME, good support system Seat Belt Safety/Bike Helmet: Wears seat belt.     Objective:    Today's Vitals   08/18/18 0904 08/18/18 1103  BP: 118/78   Pulse: 60   SpO2: 97%   Weight: 154 lb 9.6 oz (70.1 kg)   Height: 5\' 6"  (1.676 m)   PainSc:  2    Body mass index is 24.95 kg/m.  Advanced Directives 08/18/2018 12/03/2017 01/13/2017 01/12/2017 12/27/2016 12/14/2016 12/14/2016  Does Patient Have a Medical Advance Directive? Yes No Yes Yes Yes Yes Yes  Type of Paramedic of [place];Living will - Healthcare Power of [place] of [place] in Chart? No - copy requested - No - copy requested No - copy requested No - copy requested No - copy requested No - copy requested  Would patient like information on creating a medical advance directive? - No - Patient declined - - - - -  Pre-existing out of facility DNR order (yellow form or pink MOST form) - - - - - - -    Current Medications (verified) Outpatient Encounter Medications as of 08/18/2018  Medication Sig  . atorvastatin (LIPITOR) 40 MG tablet Take 1 tablet (40 mg total) by mouth daily.  .  baclofen (LIORESAL) 10 MG tablet TAKE ONE TABLET BY MOUTH THREE TIMES DAILY  . gabapentin (NEURONTIN) 300 MG capsule TAKE 1 CAPSULE BY MOUTH THREE TIMES DAILY  . IncobotulinumtoxinA (XEOMIN IM) Inject 500 Units into the muscle every 3 (three) months.  . insulin detemir (LEVEMIR) 100 UNIT/ML injection 40 units sq once daily  . levETIRAcetam (KEPPRA) 500 MG tablet Take 1 tablet (500 mg total) by mouth 2 (two) times daily.  . nitroGLYCERIN (NITROSTAT) 0.4 MG SL tablet Place 0.4 mg under the tongue every 5 (five) minutes as needed for chest pain (up to 3 doses.  If no relief call MD/NP).   Marland Kitchen warfarin (COUMADIN) 5 MG tablet Take 5 mg by mouth daily.    Facility-Administered Encounter Medications as of 08/18/2018  Medication  . incobotulinumtoxinA (XEOMIN) 100 units injection 500 Units    Allergies (verified) Pork-derived products   History: Past Medical History:  Diagnosis Date  . Acute blood loss anemia   . CHF (congestive heart failure) (HCC)    EF 57-47%, grade 1 diastolic dysfunction per echo 04/2015  . 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 CVA (cerebral infarction) 04/2015   Started Plavix 04/2015  . Depression   . Diabetes ([place]) 2016   Type II. On insulin  . Enchondroma of bone 2011   left femur.   . Fracture, intertrochanteric, right femur ([place])   . History of lower GI bleeding   . Hyperlipidemia   . Hypertension   .  Patent foramen ovale 04/2015   Started on warfarin 04/2015  . Protein calorie malnutrition ([place])   . Stercoral ulcer of rectum 05/16/2015   Past Surgical History:  Procedure Laterality Date  . CARDIAC SURGERY     Cath without stent  . COLONOSCOPY N/A 05/15/2015   Procedure: COLONOSCOPY;  Surgeon: Mauri Pole, MD;  Location[facility]COPY;  Service: Endoscopy;  Laterality: N/A;  . FLEXIBLE SIGMOIDOSCOPY N/A 05/15/2015   Procedure: FLEXIBLE SIGMOIDOSCOPY;  Surgeon: Mauri Pole, MD;   Location: [facility] ENDOSCOPY;  Service: Endoscopy;  Laterality: N/A;  at bedside  . INTRAMEDULLARY (IM) NAIL INTERTROCHANTERIC Right 06/12/2015   Procedure: INTRAMEDULLARY (IM) NAIL RIGHT HIP;  Surgeon: Renette Butters, MD;  Location: [place];  Service: Orthopedics;  Laterality: Right;  . TEE WITHOUT CARDIOVERSION N/A 05/05/2015   Procedure: TRANSESOPHAGEAL ECHOCARDIOGRAM (TEE);  Surgeon: Dixie Dials, MD;  Location[facility] ENDOSCOPY;  Service: Cardiovascular;  Laterality: N/A;   Family History  Problem Relation Age of Onset  . Hypertension Mother   . Heart failure Mother   . Hyperlipidemia Mother   . Heart attack Father    Social History   Socioeconomic History  . Marital status: Married    Spouse name: Not on file  . Number of children: 6  . Years of education: 17  . Highest education level: Not on file  Occupational History  . Occupation: Retired  Scientific laboratory technician  . Financial resource strain: Not on file  . Food insecurity:    Worry: Not on file    Inability: Not on file  . Transportation needs:    Medical: Not on file    Non-medical: Not on file  Tobacco Use  . Smoking status: Former Smoker    Types: Cigarettes    Last attempt to quit: 04/05/2015    Years since quitting: 3.3  . Smokeless tobacco: Never Used  . Tobacco comment: Quit 04/28/15  Substance and Sexual Activity  . Alcohol use: No    Alcohol/week: 0.0 standard drinks  . Drug use: No  . Sexual activity: Not on file  Lifestyle  . Physical activity:    Days per week: Not on file    Minutes per session: Not on file  . Stress: Not on file  Relationships  . Social connections:    Talks on phone: Not on file    Gets together: Not on file    Attends religious service: Not on file    Active member of club or organization: Not on file    Attends meetings of clubs or organizations: Not on file    Relationship status: Not on file  Other Topics Concern  . Not on file  Social History Narrative   She will be living with her  two sons.\   Right-handed.   No caffeine use.    Tobacco Counseling Counseling given: Not Answered Comment: Quit 04/28/15  Activities of Daily Living In your present state of health, do you have any difficulty performing the following activities: 08/18/2018  Hearing? N  Vision? N  Difficulty concentrating or making decisions? Y  Walking or climbing stairs? Y  Dressing or bathing? Y  Doing errands, shopping? Y  Preparing Food and eating ? Y  Using the Toilet? Y  In the past six months, have you accidently leaked urine? Y  Do you have problems with loss of bowel control? Y  Managing your Medications? Y  Managing your Finances? Y  Housekeeping or managing your Housekeeping? Y  Some recent  data might be hidden     Immunizations and Health Maintenance Immunization History  Administered Date(s) Administered  . Influenza, High Dose Seasonal PF 04/20/2017, 08/18/2018  . PPD Test 06/30/2015  . Pneumococcal Conjugate-13 10/26/2017   Health Maintenance Due  Topic Date Due  . Hepatitis C Screening  August 16, 1945  . DEXA SCAN  08/13/2009  . HEMOGLOBIN A1C  04/27/2018    Patient Care Team: Biagio Borg, MD as PCP - General (Internal Medicine) Gardiner Barefoot, DPM as Consulting Physician (Podiatry) Marcial Pacas, MD as Consulting Physician (Neurology)  Indicate any recent Medical Services you may have received from other than [facility] providers in the past year (date may be approximate).     Assessment:   This is a routine wellness examination for [place]. Physical assessment deferred to PCP.  Hearing/Vision screen Hearing Screening Comments: Able to hear conversational tones w/o difficulty. No issues reported.  Passed whisper test Vision Screening Comments: appointment yearly Dr. Hassell Done  Dietary issues and exercise activities discussed: Current Exercise Habits: The patient does not participate in regular exercise at present(chair exercise print-outs provided), Exercise limited by:  neurologic condition(s)(hemiplegia right side)  Diet (meal preparation, eat out, water intake, caffeinated beverages, dairy products, fruits and vegetables): in general, a "healthy" diet  , well balanced   Reviewed heart healthy and diabetic diet. Encouraged patient to increase daily water and healthy fluid intake.  Discussed supplementing with Glucerna, samples and coupons provided.  Goals   None    Depression Screen PHQ 2/9 Scores 08/18/2018 10/26/2017 01/15/2016  PHQ - 2 Score 0 0 0    Fall Risk Fall Risk  08/18/2018 08/18/2018 10/26/2017 01/15/2016  Falls in the past year? 0 0 No Yes  Number falls in past yr: - - - 1  Injury with Fall? - - - Yes  Risk for fall due to : Impaired balance/gait;Impaired mobility - - Impaired balance/gait  Follow up Falls prevention discussed - - -   Cognitive Function: MMSE - Mini Mental State Exam 08/18/2018  Not completed: Unable to complete  Orientation to time 3  Orientation to Place 4  Registration 3  Attention/ Calculation 3  Recall 0  Language- name 2 objects 2  Language- repeat 0  Language- follow 3 step command 3  Language- read & follow direction 1  Write a sentence (No Data)  Write a sentence-comments unable to do task right hand hemiplegia  Copy design (No Data)  Copy design-comments unable to do right hand hemiplegia        Screening Tests Health Maintenance  Topic Date Due  . Hepatitis C Screening  1945/12/24  . DEXA SCAN  08/13/2009  . HEMOGLOBIN A1C  04/27/2018  . MAMMOGRAM  08/19/2019 (Originally 08/13/1994)  . PNA vac Low Risk Adult (2 of 2 - PPSV23) 10/27/2018  . URINE MICROALBUMIN  11/05/2018  . OPHTHALMOLOGY EXAM  12/06/2018  . FOOT EXAM  08/19/2019  . TETANUS/TDAP  07/05/2024  . COLONOSCOPY  05/14/2025  . INFLUENZA VACCINE  Completed     Plan:    Reviewed health maintenance screenings with patient today and relevant education, vaccines, and/or referrals were provided.   Continue doing brain stimulating  activities (puzzles, reading, adult coloring books, staying active) to keep memory sharp.   Continue to eat heart healthy diet (full of fruits, vegetables, whole grains, lean protein, water--limit salt, fat, and sugar intake) and increase physical activity as tolerated.  I have personally reviewed and noted the following in the patient's chart:   .  Medical and social history . Use of alcohol, tobacco or illicit drugs  . Current medications and supplements . Functional ability and status . Nutritional status . Physical activity . Advanced directives . List of other physicians . Vitals . Screenings to include cognitive, depression, and falls . Referrals and appointments  In addition, I have reviewed and discussed with patient certain preventive protocols, quality metrics, and best practice recommendations. A written personalized care plan for preventive services as well as general preventive health recommendations were provided to patient.     Michiel Cowboy, RN   08/18/2018     Medical screening examination/treatment/procedure(s) were performed by non-physician practitioner and as supervising physician I was immediately available for consultation/collaboration. I agree with above. Cathlean Cower, MD

## 2018-08-19 LAB — HEPATITIS C ANTIBODY
Hepatitis C Ab: NONREACTIVE
SIGNAL TO CUT-OFF: 0.02 (ref <1.00)

## 2018-08-30 ENCOUNTER — Telehealth: Payer: Self-pay | Admitting: Internal Medicine

## 2018-08-30 NOTE — Telephone Encounter (Signed)
Copied from [place] [id number]. Topic: Quick Communication - Home Health Verbal Orders >> Aug 30, 2018  5:06 PM Alanda Slim E wrote: Caller/Agency: [facility] Number: [phone] ext [phone] The Requesting orders for wheelchair needs to have the same verbiage/narrative that is worded in the orders in the office visit notes as well for  insurance to approve/ please make necessary changes to move forward with the order and sign

## 2018-08-31 ENCOUNTER — Telehealth: Payer: Self-pay | Admitting: Internal Medicine

## 2018-08-31 DIAGNOSIS — G8111 Spastic hemiplegia affecting right dominant side: Secondary | ICD-10-CM

## 2018-08-31 NOTE — Telephone Encounter (Signed)
Done hardcopy to shirron 

## 2018-09-05 ENCOUNTER — Inpatient Hospital Stay: Admission: RE | Admit: 2018-09-05 | Payer: Medicare Other | Source: Ambulatory Visit

## 2018-11-04 ENCOUNTER — Other Ambulatory Visit: Payer: Self-pay | Admitting: Neurology

## 2018-11-16 ENCOUNTER — Other Ambulatory Visit: Payer: Self-pay | Admitting: Neurology

## 2019-01-02 ENCOUNTER — Encounter: Payer: Self-pay | Admitting: Speech Pathology

## 2019-01-02 NOTE — Therapy (Signed)
SPEECH THERAPY DISCHARGE SUMMARY  Visits from Start of Care: 11  Current functional level related to goals / functional outcomes: See goals below. Pt met 1/4 Long term goals and is discharged as she did not return after her last visit on 12/14/16.   Remaining deficits: neurogenic stuttering/apraxia of speech    Education / Equipment: Fluency strategies, compensations for apraxia Plan: Patient agrees to discharge.  Patient goals were partially met. Patient is being discharged due to not returning since the last visit.  ?????         SLP Long Term Goals - 01/02/19 1038      SLP LONG TERM GOAL #1   Title  pt will recite rote speech tasks using speech compensations for fluent speech 70% success    Status  Achieved      SLP LONG TERM GOAL #2   Title  pt will answer wh ?s with multimodal communication PRN, functionally, with rare min A    Time  2    Period  Weeks    Status  Not Met      SLP LONG TERM GOAL #3   Title  pt will communicate wants and needs functionally using speech compensations     Time  2    Period  Weeks    Status  Not Met      SLP LONG TERM GOAL #4   Title  pt will respond to family's appropriate cues for fluent speech by improved fluency 50% of the time    Time  2    Period  Weeks    Status  Not Met      SLP LONG TERM GOAL #5   Title  pt will perform apraxia/speech HEP with 60% fluent speech with rare min A    Status  Not Met        Deneise Lever, MS, [place] [address] [place], [place], [address] Phone: [phone]   Fax:  [phone]  Patient Details  Name: [name] MRN: [id number] Date of Birth: 06/02/1946 Referring Provider:  No ref. provider found  Encounter Date: 01/02/2019   Aliene Altes 01/02/2019, 10:39 AM  [facility] [address] [place], [place],  [address] Phone: [phone]   Fax:  [phone]

## 2019-01-03 DIAGNOSIS — G40909 Epilepsy, unspecified, not intractable, without status epilepticus: Secondary | ICD-10-CM | POA: Diagnosis not present

## 2019-01-03 DIAGNOSIS — I639 Cerebral infarction, unspecified: Secondary | ICD-10-CM | POA: Diagnosis not present

## 2019-01-03 DIAGNOSIS — F17290 Nicotine dependence, other tobacco product, uncomplicated: Secondary | ICD-10-CM | POA: Diagnosis not present

## 2019-01-03 DIAGNOSIS — Z87820 Personal history of traumatic brain injury: Secondary | ICD-10-CM | POA: Diagnosis not present

## 2019-01-03 DIAGNOSIS — E785 Hyperlipidemia, unspecified: Secondary | ICD-10-CM | POA: Diagnosis not present

## 2019-01-03 DIAGNOSIS — Z7901 Long term (current) use of anticoagulants: Secondary | ICD-10-CM | POA: Diagnosis not present

## 2019-01-03 DIAGNOSIS — I10 Essential (primary) hypertension: Secondary | ICD-10-CM | POA: Diagnosis not present

## 2019-01-03 DIAGNOSIS — E119 Type 2 diabetes mellitus without complications: Secondary | ICD-10-CM | POA: Diagnosis not present

## 2019-02-06 ENCOUNTER — Encounter: Payer: Self-pay | Admitting: Neurology

## 2019-02-06 ENCOUNTER — Other Ambulatory Visit: Payer: Self-pay

## 2019-02-06 ENCOUNTER — Ambulatory Visit (INDEPENDENT_AMBULATORY_CARE_PROVIDER_SITE_OTHER): Payer: Medicare Other | Admitting: Neurology

## 2019-02-06 VITALS — BP 102/98 | HR 74 | Temp 99.3°F | Ht 66.0 in | Wt 152.0 lb

## 2019-02-06 DIAGNOSIS — G8111 Spastic hemiplegia affecting right dominant side: Secondary | ICD-10-CM

## 2019-02-06 DIAGNOSIS — G40209 Localization-related (focal) (partial) symptomatic epilepsy and epileptic syndromes with complex partial seizures, not intractable, without status epilepticus: Secondary | ICD-10-CM | POA: Diagnosis not present

## 2019-02-06 DIAGNOSIS — I639 Cerebral infarction, unspecified: Secondary | ICD-10-CM | POA: Diagnosis not present

## 2019-02-06 MED ORDER — LEVETIRACETAM 500 MG PO TABS: 500.0000 mg | ORAL_TABLET | Freq: Two times a day (BID) | ORAL | 4 refills | Status: DC

## 2019-02-06 MED ORDER — BACLOFEN 10 MG PO TABS: 10.0000 mg | ORAL_TABLET | Freq: Three times a day (TID) | ORAL | 4 refills | Status: DC

## 2019-02-06 MED ORDER — GABAPENTIN 300 MG PO CAPS: 300.0000 mg | ORAL_CAPSULE | Freq: Three times a day (TID) | ORAL | 4 refills | Status: DC

## 2019-02-06 NOTE — Progress Notes (Signed)
Chief Complaint  Patient presents with  . Follow-up    pt with son, following up, no issues or concerns.      PATIENT: [name] DOB: 1945-08-22   HISTORICAL  [name] is a 73 years old right-handed female, accompanied by her son [name] follow-up emergency room visit on December 03, 2017 for partial seizure,  She had a history of hypertension, diabetes, hyperlipidemia, she suffered left ACA infarction with left A2 occlusion in October 2017, presented with acute onset slurred speech, right leg and arm weakness,    She was found to have patent foramina ovale by TEE in October 2016, was put on Coumadin as stroke prevention  She also suffered right hip fracture require surgery in June 12 2015, She now lives at home with her son [name]  Since stroke, she suffered severe right shoulder pain, right shoulder tightness, limited range of motion, there was also intermittent confusion word finding difficulties more than her baseline level, but there was never seizure activity noted  X-ray of her right shoulder in January 2017 showed no significant abnormality  Previous EEG in May 2017 showed mild background slowing, there was no evidence of epileptiform discharge. Laboratory evaluation in 2017 A1c 8.4, frequent UTIs, INR 1.33-2.5, she supposed to take Coumadin, CMP showed elevated glucose 194, creatinine 0.91,   She begin to receive EMG guided botulism toxin injection for spastic right hemiparesis since April 2018, which has helped relax her right shoulder, last injection was in January 2019,  On December 03, 2017, she was noted by her son [name] slumped over, foaming, out of the mouth, right side body jerking movement, unresponsive, followed by extreme confused, fatigue afterwards,  MRI of brain on December 03, 2017 showed no acute abnormality, old infarction in the left anterior cerebral artery territory, smaller old infarction in the right anterior cerebral artery,  CMP showed hemoglobin of  16, creatinine of 0.7, glucose of 169  UPDATE August 4th 2020: She lives with her son, has no recurrent seizure, continue has right shoulder arm pain, has missed her botulism toxin injection, need refill of her medication today  REVIEW OF SYSTEMS: Full 14 system review of systems performed and notable only for right shoulder pain  ALLERGIES: Allergies  Allergen Reactions  . Pork-Derived Products Other (See Comments)    To keep blood pressure down    HOME MEDICATIONS: Current Outpatient Medications  Medication Sig Dispense Refill  . atorvastatin (LIPITOR) 40 MG tablet Take 1 tablet (40 mg total) by mouth daily. 30 tablet 3  . baclofen (LIORESAL) 10 MG tablet TAKE ONE TABLET BY MOUTH THREE TIMES DAILY 90 tablet 11  . gabapentin (NEURONTIN) 300 MG capsule TAKE 1 CAPSULE BY MOUTH THREE TIMES DAILY 90 capsule 11  . IncobotulinumtoxinA (XEOMIN IM) Inject 500 Units into the muscle every 3 (three) months.    . insulin detemir (LEVEMIR) 100 UNIT/ML injection 50 units sq once daily 10 mL 11  . levETIRAcetam (KEPPRA) 500 MG tablet Take 1 tablet (500 mg total) by mouth 2 (two) times daily. 180 tablet 4  . nitroGLYCERIN (NITROSTAT) 0.4 MG SL tablet Place 0.4 mg under the tongue every 5 (five) minutes as needed for chest pain (up to 3 doses.  If no relief call MD/NP).     . Vitamin D, Ergocalciferol, (DRISDOL) 1.25 MG (50000 UT) CAPS capsule Take 1 capsule (50,000 Units total) by mouth every 7 (seven) days. 12 capsule 0  . warfarin (COUMADIN) 5 MG tablet Take 5 mg by  mouth daily.      Current Facility-Administered Medications  Medication Dose Route Frequency Provider Last Rate Last Dose  . incobotulinumtoxinA (XEOMIN) 100 units injection 500 Units  500 Units Intramuscular Q90 days Marcial Pacas, MD   500 Units at 03/11/18 2241    PAST MEDICAL HISTORY: Past Medical History:  Diagnosis Date  . Acute blood loss anemia   . CHF (congestive heart failure) (HCC)    EF 05-39%, grade 1 diastolic  dysfunction per echo 04/2015  . 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 CVA (cerebral infarction) 04/2015   Started Plavix 04/2015  . Depression   . Diabetes ([place]) 2016   Type II. On insulin  . Enchondroma of bone 2011   left femur.   . Fracture, intertrochanteric, right femur ([place])   . History of lower GI bleeding   . Hyperlipidemia   . Hypertension   . Patent foramen ovale 04/2015   Started on warfarin 04/2015  . Protein calorie malnutrition ([place])   . Stercoral ulcer of rectum 05/16/2015    PAST SURGICAL HISTORY: Past Surgical History:  Procedure Laterality Date  . CARDIAC SURGERY     Cath without stent  . COLONOSCOPY N/A 05/15/2015   Procedure: COLONOSCOPY;  Surgeon: Mauri Pole, MD;  Location[facility] END[facility]COPY;  Service: Endoscopy;  Laterality: N/A;  . FLEXIBLE SIGMOIDOSCOPY N/A 05/15/2015   Procedure: FLEXIBLE SIGMOIDOSCOPY;  Surgeon: Mauri Pole, MD;  Location: [facility] ENDOS[facility];  Service: Endoscopy;  Laterality: N/A;  at bedside  . INTRAMEDULLARY (IM) NAIL INTERTROCHANTERIC Right 06/12/2015   Procedure: INTRAMEDULLARY (IM) NAIL RIGHT HIP;  Surgeon: Renette Butters, MD;  Location: [place];  Service: Orthopedics;  Laterality: Right;  . TEE WITHOUT CARDIOVERSION N/A 05/05/2015   Procedure: TRANSESOPHAGEAL ECHOCARDIOGRAM (TEE);  Surgeon: Dixie Dials, MD;  Location: [facility] ENDOSCOPY;  Service: Cardiovascular;  Laterality: N/A;    FAMILY HISTORY: Family History  Problem Relation Age of Onset  . Hypertension Mother   . Heart failure Mother   . Hyperlipidemia Mother   . Heart attack Father     SOCIAL HISTORY:  Social History   Socioeconomic History  . Marital status: Married    Spouse name: Not on file  . Number of children: 6  . Years of education: 31  . Highest education level: Not on file  Occupational History  . Occupation: Retired  Scientific laboratory technician  . Financial resource strain: Not on file  . Food  insecurity    Worry: Not on file    Inability: Not on file  . Transportation needs    Medical: Not on file    Non-medical: Not on file  Tobacco Use  . Smoking status: Former Smoker    Types: Cigarettes    Quit date: 04/05/2015    Years since quitting: 3.8  . Smokeless tobacco: Never Used  . Tobacco comment: Quit 04/28/15  Substance and Sexual Activity  . Alcohol use: No    Alcohol/week: 0.0 standard drinks  . Drug use: No  . Sexual activity: Not on file  Lifestyle  . Physical activity    Days per week: Not on file    Minutes per session: Not on file  . Stress: Not on file  Relationships  . Social Herbalist on phone: Not on file    Gets together: Not on file    Attends religious service: Not on file    Active member of club or organization: Not  on file    Attends meetings of clubs or organizations: Not on file    Relationship status: Not on file  . Intimate partner violence    Fear of current or ex partner: Not on file    Emotionally abused: Not on file    Physically abused: Not on file    Forced sexual activity: Not on file  Other Topics Concern  . Not on file  Social History Narrative   She will be living with her two sons.\   Right-handed.   No caffeine use.     PHYSICAL EXAM   Vitals:   02/06/19 0959  BP: (!) 102/98  Pulse: 74  Temp: 99.3 F (37.4 C)  Weight: 152 lb (68.9 kg)  Height: 5\' 6"  (1.676 m)    Not recorded      Body mass index is 24.53 kg/m.  PHYSICAL EXAMNIATION:  Gen: NAD, conversant, well nourised, obese, well groomed                     Cardiovascular: Regular rate rhythm, no peripheral edema, warm, nontender. Eyes: Conjunctivae clear without exudates or hemorrhage Neck: Supple, no carotid bruise. Pulmonary: Clear to auscultation bilaterally   NEUROLOGICAL EXAM:  MENTAL STATUS: Speech/cognition: She has expressive aphasia, following commands, able to onset question by yes or no, not intelligible speech,   CRANIAL  NERVES: CN II: Right visual field deficit on double spontaneous stimulation, Pupils are round equal and briskly reactive to light. CN III, IV, VI: extraocular movement are normal. No ptosis. CN V: Facial sensation is intact to pinprick in all 3 divisions bilaterally. Corneal responses are intact.  CN VII: Face is symmetric with normal eye closure and smile. CN VIII: Hearing is normal to rubbing fingers CN IX, X: Palate elevates symmetrically. Phonation is normal. CN XI: Head turning and shoulder shrug are intact CN XII: Tongue is midline with normal movements and no atrophy.  MOTOR: She only has trace movement of right lower extremity, complains of significant right shoulder pain, limited range of motion of right shoulder, right proximal upper extremity motor strength was 3 and distal strength was 4, tendency to hold right leg in extension, significant right ankle dorsiflexion weakness.  REFLEXES: Hyperreflexia on the right arm, right leg, plantar responses are flexor bilaterally  SENSORY: Intact to light touch, pinprick and vibratory sensation  COORDINATION: Rapid alternating movements and fine finger movements are intact. There is no dysmetria on finger-to-nose and heel-knee-shin.    GAIT/STANCE: She has difficulty bearing weight even with assistant   DIAGNOSTIC DATA (LABS, IMAGING, TESTING) - I reviewed patient records, labs, notes, testing and imaging myself where available.   ASSESSMENT AND PLAN  PENNIE VANBLARCOM is a 73 y.o. female   Complex partial seizure  She has evidence of left frontal encephalomalacia,    Keep Keppra 500 mg twice a day,   Vitamin D deficiency  Continue vitamin D supplement   Left ACA stroke, left A2 occlusion with residual spastic right hemiparesis, right shoulder pain  She has evidence of patent foraminal ovale is taking Coumadin  Continue EMG guided xeomin injection for spastic right hemiparesis   Previously used 500 units of xeomin   Marcial Pacas, M.D. Ph.D.  [facility] [place], [place] [address] Phone: [phone] Fax:      [phone]

## 2019-05-14 DIAGNOSIS — I639 Cerebral infarction, unspecified: Secondary | ICD-10-CM | POA: Diagnosis not present

## 2019-05-14 DIAGNOSIS — E119 Type 2 diabetes mellitus without complications: Secondary | ICD-10-CM | POA: Diagnosis not present

## 2019-05-14 DIAGNOSIS — G40909 Epilepsy, unspecified, not intractable, without status epilepticus: Secondary | ICD-10-CM | POA: Diagnosis not present

## 2019-05-14 DIAGNOSIS — E785 Hyperlipidemia, unspecified: Secondary | ICD-10-CM | POA: Diagnosis not present

## 2019-05-14 DIAGNOSIS — I10 Essential (primary) hypertension: Secondary | ICD-10-CM | POA: Diagnosis not present

## 2019-05-14 DIAGNOSIS — F17290 Nicotine dependence, other tobacco product, uncomplicated: Secondary | ICD-10-CM | POA: Diagnosis not present

## 2019-05-21 ENCOUNTER — Telehealth: Payer: Self-pay | Admitting: Neurology

## 2019-05-21 NOTE — Telephone Encounter (Signed)
I returned the call to the patient's son on DPR.  Reports his mother has been experiencing a headache for the last 4-5 days.  He started giving her Tylenol yesterday with some relief. She has never been evaluated for headaches in the past.  He says she normally does not have problems with them.  He is going to call her PCP for work-up.  If her PCP feels she needs neurological evaluation, then our office is happy to see her.

## 2019-05-21 NOTE — Telephone Encounter (Signed)
Patient son called to report patient has been having headaches since Thursday. Patient son states he has been giving her tylenol to help with headaches however, would like to know what provider recommends. Please follow up.

## 2019-05-22 ENCOUNTER — Ambulatory Visit: Payer: Medicare Other | Admitting: Internal Medicine

## 2019-06-12 ENCOUNTER — Encounter: Payer: Self-pay | Admitting: Internal Medicine

## 2019-06-12 ENCOUNTER — Ambulatory Visit (INDEPENDENT_AMBULATORY_CARE_PROVIDER_SITE_OTHER): Payer: Medicare Other | Admitting: Internal Medicine

## 2019-06-12 DIAGNOSIS — E1159 Type 2 diabetes mellitus with other circulatory complications: Secondary | ICD-10-CM | POA: Diagnosis not present

## 2019-06-12 DIAGNOSIS — R300 Dysuria: Secondary | ICD-10-CM | POA: Diagnosis not present

## 2019-06-12 DIAGNOSIS — R350 Frequency of micturition: Secondary | ICD-10-CM | POA: Diagnosis not present

## 2019-06-12 DIAGNOSIS — I10 Essential (primary) hypertension: Secondary | ICD-10-CM

## 2019-06-12 NOTE — Assessment & Plan Note (Signed)
Ok for [facility], and tx pending results

## 2019-06-12 NOTE — Assessment & Plan Note (Signed)
Son states a1c > 11 last wk per cardiology appt, ok for endo referral, DM diet and better med compliance

## 2019-06-12 NOTE — Progress Notes (Addendum)
Patient ID: [name], female   DOB: 1946-02-27, 73 y.o.   MRN: [id number]  Virtual Visit via Video Note  I connected with [name] on 06/12/19 at  3:40 PM EST by a video enabled telemedicine application and verified that I am speaking with the correct person using two identifiers.  Location: Patient: at home Provider: at office   I discussed the limitations of evaluation and management by telemedicine and the availability of in person appointments. The patient expressed understanding and agreed to proceed.  History of Present Illness: Here to f/u; overall doing ok,  Pt denies chest pain, increasing sob or doe, wheezing, orthopnea, PND, increased LE swelling, palpitations, dizziness or syncope.  Pt denies new neurological symptoms such as new headache, or facial or extremity weakness or numbness.  Pt denies polydipsia, polyuria, or low sugar episode.  Pt states overall good compliance with meds, mostly trying to follow appropriate diet, with wt overall stable,  but little exercise however. Also c/o mild  Dysuria x 3 dat, Denies urinary symptoms such as  frequency, urgency, flank pain, hematuria or n/v, fever, chills.   Past Medical History:  Diagnosis Date  . Acute blood loss anemia   . CHF (congestive heart failure) (HCC)    EF 99991111, grade 1 diastolic dysfunction per echo 04/2015  . 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 CVA (cerebral infarction) 04/2015   Started Plavix 04/2015  . Depression   . Diabetes ([place]) 2016   Type II. On insulin  . Enchondroma of bone 2011   left femur.   . Fracture, intertrochanteric, right femur ([place])   . History of lower GI bleeding   . Hyperlipidemia   . Hypertension   . Patent foramen ovale 04/2015   Started on warfarin 04/2015  . Protein calorie malnutrition ([place])   . Stercoral ulcer of rectum 05/16/2015   Past Surgical History:  Procedure Laterality Date  . CARDIAC SURGERY      Cath without stent  . COLONOSCOPY N/A 05/15/2015   Procedure: COLONOSCOPY;  Surgeon: Mauri Pole, MD;  Location: [facility] ENDOSCOPY;  Service: Endoscopy;  Laterality: N/A;  . FLEXIBLE SIGMOIDOSCOPY N/A 05/15/2015   Procedure: FLEXIBLE SIGMOIDOSCOPY;  Surgeon: Mauri Pole, MD;  Location: [place] ENDOSCOPY;  Service: Endoscopy;  Laterality: N/A;  at bedside  . INTRAMEDULLARY (IM) NAIL INTERTROCHANTERIC Right 06/12/2015   Procedure: INTRAMEDULLARY (IM) NAIL RIGHT HIP;  Surgeon: Renette Butters, MD;  Location: [place];  Service: Orthopedics;  Laterality: Right;  . TEE WITHOUT CARDIOVERSION N/A 05/05/2015   Procedure: TRANSESOPHAGEAL ECHOCARDIOGRAM (TEE);  Surgeon: Dixie Dials, MD;  Location: [facility] ENDOSCOPY;  Service: Cardiovascular;  Laterality: N/A;    reports that she quit smoking about 4 years ago. Her smoking use included cigarettes. She has never used smokeless tobacco. She reports that she does not drink alcohol or use drugs. family history includes Heart attack in her father; Heart failure in her mother; Hyperlipidemia in her mother; Hypertension in her mother. Allergies  Allergen Reactions  . Pork-Derived Products Other (See Comments)    To keep blood pressure down   Current Outpatient Medications on File Prior to Visit  Medication Sig Dispense Refill  . atorvastatin (LIPITOR) 40 MG tablet Take 1 tablet (40 mg total) by mouth daily. 30 tablet 3  . baclofen (LIORESAL) 10 MG tablet Take 1 tablet (10 mg total) by mouth 3 (three) times daily. 270 tablet 4  . gabapentin (NEURONTIN) 300 MG  capsule Take 1 capsule (300 mg total) by mouth 3 (three) times daily. 270 capsule 4  . IncobotulinumtoxinA (XEOMIN IM) Inject 500 Units into the muscle every 3 (three) months.    . insulin detemir (LEVEMIR) 100 UNIT/ML injection 50 units sq once daily 10 mL 11  . levETIRAcetam (KEPPRA) 500 MG tablet Take 1 tablet (500 mg total) by mouth 2 (two) times daily. 180 tablet 4  . nitroGLYCERIN (NITROSTAT) 0.4 MG  SL tablet Place 0.4 mg under the tongue every 5 (five) minutes as needed for chest pain (up to 3 doses.  If no relief call MD/NP).     . Vitamin D, Ergocalciferol, (DRISDOL) 1.25 MG (50000 UT) CAPS capsule Take 1 capsule (50,000 Units total) by mouth every 7 (seven) days. 12 capsule 0  . warfarin (COUMADIN) 5 MG tablet Take 5 mg by mouth daily.      Current Facility-Administered Medications on File Prior to Visit  Medication Dose Route Frequency Provider Last Rate Last Dose  . incobotulinumtoxinA (XEOMIN) 100 units injection 500 Units  500 Units Intramuscular Q90 days Marcial Pacas, MD   500 Units at 03/11/18 2241    Observations/Objective: Alert, NAD, appropriate mood and affect, resps normal, cn 2-12 intact, moves all 4s, no visible rash or swelling Lab Results  Component Value Date   WBC 10.5 08/18/2018   HGB 16.2 (H) 08/18/2018   HCT 48.8 (H) 08/18/2018   PLT 264.0 08/18/2018   GLUCOSE 199 (H) 08/18/2018   CHOL 152 08/18/2018   TRIG 165.0 (H) 08/18/2018   HDL 48.90 08/18/2018   LDLDIRECT 61.0 10/26/2017   LDLCALC 70 08/18/2018   ALT 23 08/18/2018   AST 21 08/18/2018   NA 140 08/18/2018   K 4.5 08/18/2018   CL 101 08/18/2018   CREATININE 0.86 08/18/2018   BUN 11 08/18/2018   CO2 26 08/18/2018   TSH 1.74 08/18/2018   INR 1.33 11/14/2015   HGBA1C 10.8 (H) 08/18/2018   MICROALBUR 2.3 (H) 11/04/2017   Assessment and Plan: See notes  Follow Up Instructions: See notes   I discussed the assessment and treatment plan with the patient. The patient was provided an opportunity to ask questions and all were answered. The patient agreed with the plan and demonstrated an understanding of the instructions.   The patient was advised to call back or seek an in-person evaluation if the symptoms worsen or if the condition fails to improve as anticipated.   Cathlean Cower, MD

## 2019-06-12 NOTE — Assessment & Plan Note (Signed)
BP at home < 140/90 per son with her,  to f/u any worsening symptoms or concerns

## 2019-06-12 NOTE — Patient Instructions (Signed)
Please continue all other medications as before, and refills have been done if requested.  Please have the pharmacy call with any other refills you may need.  Please continue your efforts at being more active, low cholesterol diet, and weight control.  Please keep your appointments with your specialists as you may have planned  You will be contacted regarding the referral for endocrinology  Please go to the LAB in the Basement (turn left off the elevator) for the tests to be done  You will be contacted by phone if any changes need to be made immediately.  Otherwise, you will receive a letter about your results with an explanation, but please check with MyChart first.  Please remember to sign up for MyChart if you have not done so, as this will be important to you in the future with finding out test results, communicating by private email, and scheduling acute appointments online when needed.

## 2019-07-03 ENCOUNTER — Other Ambulatory Visit (INDEPENDENT_AMBULATORY_CARE_PROVIDER_SITE_OTHER): Payer: Medicare Other

## 2019-07-03 ENCOUNTER — Telehealth: Payer: Self-pay | Admitting: Internal Medicine

## 2019-07-03 DIAGNOSIS — R350 Frequency of micturition: Secondary | ICD-10-CM | POA: Diagnosis not present

## 2019-07-03 DIAGNOSIS — R269 Unspecified abnormalities of gait and mobility: Secondary | ICD-10-CM

## 2019-07-03 LAB — URINALYSIS, ROUTINE W REFLEX MICROSCOPIC
Bilirubin Urine: NEGATIVE
Ketones, ur: NEGATIVE
Nitrite: POSITIVE — AB
Specific Gravity, Urine: 1.025 (ref 1.000–1.030)
Total Protein, Urine: NEGATIVE
Urine Glucose: >=1000 — AB
Urobilinogen, UA: 0.2 (ref 0.0–1.0)
pH: 5 (ref 5.0–8.0)

## 2019-07-03 NOTE — Telephone Encounter (Signed)
Pt's son called in stating that he would like PCP to order new commode that goes over toilet to assist with the bathroom. Pt's son would either like it ready for pick up or would like it sent to a medical supply store. Please advise.

## 2019-07-03 NOTE — Telephone Encounter (Signed)
Please advise 

## 2019-07-05 ENCOUNTER — Encounter: Payer: Self-pay | Admitting: Internal Medicine

## 2019-07-05 LAB — URINE CULTURE
MICRO NUMBER:: 1236618
SPECIMEN QUALITY:: ADEQUATE

## 2019-07-09 NOTE — Telephone Encounter (Signed)
Pt son call back requesting Rx medical supply for the commode. Please advise.

## 2019-07-19 ENCOUNTER — Ambulatory Visit: Payer: Medicare Other | Admitting: Endocrinology

## 2019-08-28 ENCOUNTER — Other Ambulatory Visit: Payer: Self-pay | Admitting: Internal Medicine

## 2019-08-28 NOTE — Telephone Encounter (Signed)
   Patients son calling to request refill.      1. Which medications need to be refilled? (please list name of each medication and dose if known)  insulin detemir (LEVEMIR) 100 UNIT/ML injection Vitamin D, Ergocalciferol, (DRISDOL) 1.25 MG (50000 UT) CAPS capsule   2. Which pharmacy/location (including street and city if local pharmacy) is medication to be sent to?[place] ([place]), [place] - [address]

## 2019-08-28 NOTE — Telephone Encounter (Signed)
Please refill as per office routine med refill policy (all routine meds refilled for 3 mo or monthly per pt preference up to one year from last visit, then month to month grace period for 3 mo, then further med refills will have to be denied)  

## 2019-08-29 ENCOUNTER — Other Ambulatory Visit: Payer: Self-pay | Admitting: Internal Medicine

## 2019-08-29 ENCOUNTER — Ambulatory Visit: Payer: Medicare Other | Admitting: Endocrinology

## 2019-08-29 ENCOUNTER — Telehealth: Payer: Self-pay

## 2019-08-29 DIAGNOSIS — G8111 Spastic hemiplegia affecting right dominant side: Secondary | ICD-10-CM

## 2019-08-29 NOTE — Telephone Encounter (Signed)
Please see message. °

## 2019-08-29 NOTE — Telephone Encounter (Signed)
No need further high dose vit d  Please change to OTC Vitamin D3 at 2000 units per day, indefinitely. 

## 2019-08-29 NOTE — Telephone Encounter (Signed)
  New message   Son [name] calling to check on the status of the message from January 4 on [employer]

## 2019-08-29 NOTE — Telephone Encounter (Signed)
Medication Requested   insulin detemir (LEVEMIR) 100 UNIT/ML injection  Vitamin D, Ergocalciferol, (DRISDOL) 1.25 MG (50000 UT) CAPS capsule  Is medication on med list: Yes  (if no, inform pt they may need an appointment)  Is medication a controled:No   (yes = last OV with PCP)  -Controlled Substances: Adderall, Ritalin, oxycodone, hydrocodone, methadone, alprazolam, etc  Last visit with PCP:   Is the OV > than 4 months: (yes = schedule an appt if one is not already made)  Pharmacy (Name, [place]): [facility] on [address]

## 2019-08-30 NOTE — Telephone Encounter (Signed)
Done hardcopy 

## 2019-08-30 NOTE — Telephone Encounter (Signed)
Raised toilet seat was requested.   Informed son that insurance does not cover that order. Son agreed to pick up anyway. I have sent message (community) to [place] to send to local retail store. Rx entered for PCP to sign.

## 2019-08-30 NOTE — Addendum Note (Signed)
Addended by: Biagio Borg on: 08/30/2019 01:04 PM   Modules accepted: Orders

## 2019-08-30 NOTE — Telephone Encounter (Signed)
[name] has already addressed message:  Note  Conversation: DME - commod (Newest Message First) August 30, 2019 Johney Frame, Adelina Mings, CMA     9:06 AM Note   Raised toilet seat was requested.   Informed son that insurance does not cover that order. Son agreed to pick up anyway. I have sent message (community) to [place] to send to local retail store. Rx entered for PCP to sign.

## 2019-08-30 NOTE — Telephone Encounter (Signed)
Spoke to  (pt son) and informed of the OTC vitamin D 2000 units and Levemir was sent to pof last night.   Son stated understanding.

## 2019-09-03 NOTE — Telephone Encounter (Addendum)
Community message received from [employer]. She stated that the patient or son can go to the [place] store and pick out and purchase the commode they would like to have.   [name] contacted and informed of same.

## 2019-09-17 ENCOUNTER — Ambulatory Visit: Payer: Medicare Other | Admitting: Endocrinology

## 2019-10-04 DIAGNOSIS — I639 Cerebral infarction, unspecified: Secondary | ICD-10-CM | POA: Diagnosis not present

## 2019-10-04 DIAGNOSIS — F17290 Nicotine dependence, other tobacco product, uncomplicated: Secondary | ICD-10-CM | POA: Diagnosis not present

## 2019-10-04 DIAGNOSIS — I10 Essential (primary) hypertension: Secondary | ICD-10-CM | POA: Diagnosis not present

## 2019-10-04 DIAGNOSIS — E785 Hyperlipidemia, unspecified: Secondary | ICD-10-CM | POA: Diagnosis not present

## 2019-10-04 DIAGNOSIS — G40909 Epilepsy, unspecified, not intractable, without status epilepticus: Secondary | ICD-10-CM | POA: Diagnosis not present

## 2019-10-04 DIAGNOSIS — E119 Type 2 diabetes mellitus without complications: Secondary | ICD-10-CM | POA: Diagnosis not present

## 2020-01-30 ENCOUNTER — Other Ambulatory Visit: Payer: Self-pay

## 2020-01-30 ENCOUNTER — Ambulatory Visit (INDEPENDENT_AMBULATORY_CARE_PROVIDER_SITE_OTHER): Payer: Medicare Other | Admitting: Internal Medicine

## 2020-01-30 ENCOUNTER — Encounter: Payer: Self-pay | Admitting: Internal Medicine

## 2020-01-30 VITALS — BP 120/82 | HR 106 | Temp 99.1°F | Ht 66.0 in

## 2020-01-30 DIAGNOSIS — E1159 Type 2 diabetes mellitus with other circulatory complications: Secondary | ICD-10-CM

## 2020-01-30 DIAGNOSIS — I10 Essential (primary) hypertension: Secondary | ICD-10-CM | POA: Diagnosis not present

## 2020-01-30 DIAGNOSIS — Z23 Encounter for immunization: Secondary | ICD-10-CM | POA: Diagnosis not present

## 2020-01-30 DIAGNOSIS — I509 Heart failure, unspecified: Secondary | ICD-10-CM | POA: Diagnosis not present

## 2020-01-30 NOTE — Assessment & Plan Note (Signed)
stable overall by history and exam, recent data reviewed with pt, and pt to continue medical treatment as before,  to f/u any worsening symptoms or concerns  

## 2020-01-30 NOTE — Progress Notes (Signed)
Subjective:    Patient ID: [name], female    DOB: 11-27-1945, 74 y.o.   MRN: [id number]  HPI  Here for yearly f/u;  Overall doing ok;  Pt denies Chest pain, worsening SOB, DOE, wheezing, orthopnea, PND, worsening LE edema, palpitations, dizziness or syncope.  Pt denies neurological change such as new headache, facial or extremity weakness.  Pt denies polydipsia, polyuria, or low sugar symptoms. Pt states overall good compliance with treatment and medications, good tolerability, and has been trying to follow appropriate diet.  Pt denies worsening depressive symptoms, suicidal ideation or panic. No fever, night sweats, wt loss, loss of appetite, or other constitutional symptoms.  Pt states poor ability with ADL's, has severely high fall risk, but home safety reviewed and adequate as son is 100% supportive, no other significant changes in hearing or vision as cataracts have been done.  Pt is wheelchair bound, non ambulatory now   Pt and son agree she may be best at a SNF, since he has cared for her as primary caretaker for 5 yrs.  Fl2 form is given to be filled out   Has appt soon for cardiology follow up Past Medical History:  Diagnosis Date  . Acute blood loss anemia   . CHF (congestive heart failure) (HCC)    EF 25-85%, grade 1 diastolic dysfunction per echo 04/2015  . 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 CVA (cerebral infarction) 04/2015   Started Plavix 04/2015  . Depression   . Diabetes ([place]) 2016   Type II. On insulin  . Enchondroma of bone 2011   left femur.   . Fracture, intertrochanteric, right femur ([place])   . History of lower GI bleeding   . Hyperlipidemia   . Hypertension   . Patent foramen ovale 04/2015   Started on warfarin 04/2015  . Protein calorie malnutrition ([place])   . Stercoral ulcer of rectum 05/16/2015   Past Surgical History:  Procedure Laterality Date  . CARDIAC SURGERY     Cath without stent  .  COLONOSCOPY N/A 05/15/2015   Procedure: COLONOSCOPY;  Surgeon: Mauri Pole, MD;  Location: [facility] ENDOSCOPY;  Service: Endoscopy;  Laterality: N/A;  . FLEXIBLE SIGMOIDOSCOPY N/A 05/15/2015   Procedure: FLEXIBLE SIGMOIDOSCOPY;  Surgeon: Mauri Pole, MD;  Location: [facility] ENDOSCOPY;  Service: Endoscopy;  Laterality: N/A;  at bedside  . INTRAMEDULLARY (IM) NAIL INTERTROCHANTERIC Right 06/12/2015   Procedure: INTRAMEDULLARY (IM) NAIL RIGHT HIP;  Surgeon: Renette Butters, MD;  Location: [facility];  Service: Orthopedics;  Laterality: Right;  . TEE WITHOUT CARDIOVERSION N/A 05/05/2015   Procedure: TRANSESOPHAGEAL ECHOCARDIOGRAM (TEE);  Surgeon: Dixie Dials, MD;  Location: [facility] ENDOSCOPY;  Service: Cardiovascular;  Laterality: N/A;    reports that she quit smoking about 4 years ago. Her smoking use included cigarettes. She has never used smokeless tobacco. She reports that she does not drink alcohol and does not use drugs. family history includes Heart attack in her father; Heart failure in her mother; Hyperlipidemia in her mother; Hypertension in her mother. Allergies  Allergen Reactions  . Pork-Derived Products Other (See Comments)    To keep blood pressure down   Current Outpatient Medications on File Prior to Visit  Medication Sig Dispense Refill  . atorvastatin (LIPITOR) 40 MG tablet Take 1 tablet (40 mg total) by mouth daily. 30 tablet 3  . baclofen (LIORESAL) 10 MG tablet Take 1 tablet (10 mg total) by mouth 3 (three) times  daily. 270 tablet 4  . ELIQUIS 5 MG TABS tablet     . gabapentin (NEURONTIN) 300 MG capsule Take 1 capsule (300 mg total) by mouth 3 (three) times daily. 270 capsule 4  . IncobotulinumtoxinA (XEOMIN IM) Inject 500 Units into the muscle every 3 (three) months.    . insulin detemir (LEVEMIR) 100 UNIT/ML injection INJECT 50 UNITS SUBCUTANEOUSLY ONCE DAILY 45 mL 3  . levETIRAcetam (KEPPRA) 500 MG tablet Take 1 tablet (500 mg total) by mouth 2 (two) times daily. 180 tablet 4    . losartan (COZAAR) 25 MG tablet     . metoprolol succinate (TOPROL-XL) 25 MG 24 hr tablet     . nitroGLYCERIN (NITROSTAT) 0.4 MG SL tablet Place 0.4 mg under the tongue every 5 (five) minutes as needed for chest pain (up to 3 doses.  If no relief call MD/NP).      Current Facility-Administered Medications on File Prior to Visit  Medication Dose Route Frequency Provider Last Rate Last Admin  . incobotulinumtoxinA (XEOMIN) 100 units injection 500 Units  500 Units Intramuscular Q90 days Marcial Pacas, MD   500 Units at 03/11/18 2241   Review of Systems All otherwise neg per pt    Objective:   Physical Exam BP 120/82 (BP Location: Left Arm, Patient Position: Sitting, Cuff Size: Large)   Pulse (!) 106   Temp 99.1 F (37.3 C) (Oral)   Ht 5\' 6"  (1.676 m)   SpO2 96%   BMI 24.53 kg/m  VS noted,  Constitutional: Pt appears in NAD HENT: Head: NCAT.  Right Ear: External ear normal.  Left Ear: External ear normal.  Eyes: . Pupils are equal, round, and reactive to light. Conjunctivae and EOM are normal Nose: without d/c or deformity Neck: Neck supple. Gross normal ROM Cardiovascular: Normal rate and regular rhythm.   Pulmonary/Chest: Effort normal and breath sounds without rales or wheezing.  Abd:  Soft, NT, ND, + BS, no organomegaly Neurological: Pt is alert. At baseline orientation, motor grossly intact except for right hemiparesis chronic Skin: Skin is warm. No rashes, other new lesions, trace bialt LE edema Psychiatric: Pt behavior is normal without agitation  All otherwise neg per pt Lab Results  Component Value Date   WBC 10.5 08/18/2018   HGB 16.2 (H) 08/18/2018   HCT 48.8 (H) 08/18/2018   PLT 264.0 08/18/2018   GLUCOSE 199 (H) 08/18/2018   CHOL 152 08/18/2018   TRIG 165.0 (H) 08/18/2018   HDL 48.90 08/18/2018   LDLDIRECT 61.0 10/26/2017   LDLCALC 70 08/18/2018   ALT 23 08/18/2018   AST 21 08/18/2018   NA 140 08/18/2018   K 4.5 08/18/2018   CL 101 08/18/2018   CREATININE  0.86 08/18/2018   BUN 11 08/18/2018   CO2 26 08/18/2018   TSH 1.74 08/18/2018   INR 1.33 11/14/2015   HGBA1C 10.8 (H) 08/18/2018   MICROALBUR 2.3 (H) 11/04/2017      Assessment & Plan:

## 2020-01-30 NOTE — Patient Instructions (Addendum)
You had the Pneumovax pneumonia shot today  Please remember to have your yearly eye exam  We will have the FL2 filled out for you to pick up  Please continue all other medications as before, and refills have been done if requested.  Please have the pharmacy call with any other refills you may need.  Please continue your efforts at being more active, low cholesterol diet, and weight control.  You are otherwise up to date with prevention measures today.  Please keep your appointments with your specialists as you may have planned  Please go to the LAB at the blood drawing area for the tests to be done - at the [facility] lab at your convenience  You will be contacted by phone if any changes need to be made immediately.  Otherwise, you will receive a letter about your results with an explanation, but please check with MyChart first.  Please remember to sign up for MyChart if you have not done so, as this will be important to you in the future with finding out test results, communicating by private email, and scheduling acute appointments online when needed.  Please make an Appointment to return in 6 months, or sooner if needed

## 2020-01-30 NOTE — Assessment & Plan Note (Signed)
stable overall by history and exam, recent data reviewed with pt, and pt to continue medical treatment as before,  to f/u any worsening symptoms or concerns, for lab f/u 

## 2020-01-30 NOTE — Assessment & Plan Note (Signed)
To f/u Dr Terrence Dupont aug 9

## 2020-01-31 ENCOUNTER — Telehealth: Payer: Self-pay | Admitting: Internal Medicine

## 2020-01-31 NOTE — Telephone Encounter (Signed)
   Patient's son calling to provide updated information to [facility]. Son reports patient is incontinent

## 2020-01-31 NOTE — Telephone Encounter (Signed)
See form done hardcopy to you

## 2020-02-04 NOTE — Telephone Encounter (Signed)
Tried to call pts son to inform him that [facility] form is ready for pick up. However, there was no answer or vm set up for me to leave a vm.

## 2020-02-09 ENCOUNTER — Other Ambulatory Visit: Payer: Self-pay | Admitting: Neurology

## 2020-02-22 DIAGNOSIS — G8110 Spastic hemiplegia affecting unspecified side: Secondary | ICD-10-CM | POA: Diagnosis not present

## 2020-02-22 DIAGNOSIS — R4701 Aphasia: Secondary | ICD-10-CM | POA: Diagnosis not present

## 2020-02-22 DIAGNOSIS — R532 Functional quadriplegia: Secondary | ICD-10-CM | POA: Diagnosis not present

## 2020-02-22 DIAGNOSIS — Z8673 Personal history of transient ischemic attack (TIA), and cerebral infarction without residual deficits: Secondary | ICD-10-CM | POA: Diagnosis not present

## 2020-02-25 DIAGNOSIS — U071 COVID-19: Secondary | ICD-10-CM | POA: Diagnosis not present

## 2020-03-11 ENCOUNTER — Other Ambulatory Visit: Payer: Self-pay | Admitting: Neurology

## 2020-03-14 ENCOUNTER — Telehealth: Payer: Self-pay | Admitting: Neurology

## 2020-03-14 MED ORDER — LEVETIRACETAM 500 MG PO TABS: 500.0000 mg | ORAL_TABLET | Freq: Two times a day (BID) | ORAL | 0 refills | Status: DC

## 2020-03-14 NOTE — Telephone Encounter (Signed)
Patient's son, terry Shon Baton, requesting seizure medication to be refilled.

## 2020-03-17 MED ORDER — LEVETIRACETAM 500 MG PO TABS
500.0000 mg | ORAL_TABLET | Freq: Two times a day (BID) | ORAL | 0 refills | Status: DC
Start: 2020-03-17 — End: 2021-06-12

## 2020-03-17 MED ORDER — LEVETIRACETAM 500 MG PO TABS
500.0000 mg | ORAL_TABLET | Freq: Two times a day (BID) | ORAL | 4 refills | Status: DC
Start: 2020-03-17 — End: 2020-03-17

## 2020-03-17 NOTE — Telephone Encounter (Signed)
I spoke to her son, [name]. She has a follow up scheduled with Sarah on 05/26/20. Refills will be sent in to last until this time.

## 2020-03-17 NOTE — Telephone Encounter (Signed)
Put her on Sara's schedule if she wants to continue refill through our office

## 2020-03-17 NOTE — Telephone Encounter (Signed)
Meds ordered this encounter  Medications  . DISCONTD: levETIRAcetam (KEPPRA) 500 MG tablet    Sig: Take 1 tablet (500 mg total) by mouth 2 (two) times daily.    Dispense:  60 tablet    Refill:  0    Pt needs an appt for further refills.  . levETIRAcetam (KEPPRA) 500 MG tablet    Sig: Take 1 tablet (500 mg total) by mouth 2 (two) times daily.    Dispense:  180 tablet    Refill:  4    Pt needs an appt for further refills.

## 2020-03-17 NOTE — Addendum Note (Signed)
Addended by: Noberto Retort C on: 03/17/2020 10:01 AM   Modules accepted: Orders

## 2020-03-17 NOTE — Addendum Note (Signed)
Addended by: Marcial Pacas on: 03/17/2020 08:49 AM   Modules accepted: Orders

## 2020-03-20 ENCOUNTER — Encounter: Payer: Self-pay | Admitting: Internal Medicine

## 2020-03-20 ENCOUNTER — Telehealth: Payer: Self-pay | Admitting: Internal Medicine

## 2020-03-20 ENCOUNTER — Other Ambulatory Visit (INDEPENDENT_AMBULATORY_CARE_PROVIDER_SITE_OTHER): Payer: Medicare Other

## 2020-03-20 ENCOUNTER — Other Ambulatory Visit: Payer: Self-pay | Admitting: Internal Medicine

## 2020-03-20 DIAGNOSIS — E1159 Type 2 diabetes mellitus with other circulatory complications: Secondary | ICD-10-CM

## 2020-03-20 DIAGNOSIS — R300 Dysuria: Secondary | ICD-10-CM

## 2020-03-20 LAB — CBC WITH DIFFERENTIAL/PLATELET
Basophils Absolute: 0.1 10*3/uL (ref 0.0–0.1)
Basophils Relative: 0.5 % (ref 0.0–3.0)
Eosinophils Absolute: 0.2 10*3/uL (ref 0.0–0.7)
Eosinophils Relative: 1.9 % (ref 0.0–5.0)
HCT: 44.4 % (ref 36.0–46.0)
Hemoglobin: 14.4 g/dL (ref 12.0–15.0)
Lymphocytes Relative: 32.4 % (ref 12.0–46.0)
Lymphs Abs: 3.4 10*3/uL (ref 0.7–4.0)
MCHC: 32.5 g/dL (ref 30.0–36.0)
MCV: 87.8 fl (ref 78.0–100.0)
Monocytes Absolute: 0.7 10*3/uL (ref 0.1–1.0)
Monocytes Relative: 6.6 % (ref 3.0–12.0)
Neutro Abs: 6.1 10*3/uL (ref 1.4–7.7)
Neutrophils Relative %: 58.6 % (ref 43.0–77.0)
Platelets: 242 10*3/uL (ref 150.0–400.0)
RBC: 5.05 Mil/uL (ref 3.87–5.11)
RDW: 13.1 % (ref 11.5–15.5)
WBC: 10.4 10*3/uL (ref 4.0–10.5)

## 2020-03-20 LAB — LIPID PANEL
Cholesterol: 128 mg/dL (ref 0–200)
HDL: 42.9 mg/dL (ref >39.00)
NonHDL: 85.16
Total CHOL/HDL Ratio: 3
Triglycerides: 219 mg/dL — ABNORMAL HIGH (ref 0.0–149.0)
VLDL: 43.8 mg/dL — ABNORMAL HIGH (ref 0.0–40.0)

## 2020-03-20 LAB — HEPATIC FUNCTION PANEL
ALT: 26 U/L (ref 0–35)
AST: 19 U/L (ref 0–37)
Albumin: 3.8 g/dL (ref 3.5–5.2)
Alkaline Phosphatase: 78 U/L (ref 39–117)
Bilirubin, Direct: 0.1 mg/dL (ref 0.0–0.3)
Total Bilirubin: 0.4 mg/dL (ref 0.2–1.2)
Total Protein: 6.7 g/dL (ref 6.0–8.3)

## 2020-03-20 LAB — LDL CHOLESTEROL, DIRECT: Direct LDL: 59 mg/dL

## 2020-03-20 LAB — HEMOGLOBIN A1C: Hgb A1c MFr Bld: 12 % — ABNORMAL HIGH (ref 4.6–6.5)

## 2020-03-20 LAB — TSH: TSH: 1.73 u[IU]/mL (ref 0.35–4.50)

## 2020-03-20 NOTE — Addendum Note (Signed)
Addended by: Lerry Liner on: 03/20/2020 02:33 PM   Modules accepted: Orders

## 2020-03-20 NOTE — Addendum Note (Signed)
Addended by: Lerry Liner on: 03/20/2020 02:32 PM   Modules accepted: Orders

## 2020-03-20 NOTE — Telephone Encounter (Signed)
    Patient went to lab today, however they are also requesting urine test order be added to Epic The lab provided patient with specimen cup. Requesting urine test for UTI symptoms

## 2020-03-20 NOTE — Telephone Encounter (Signed)
[facility] labs orders done for [employer]

## 2020-03-21 ENCOUNTER — Other Ambulatory Visit (INDEPENDENT_AMBULATORY_CARE_PROVIDER_SITE_OTHER): Payer: Medicare Other

## 2020-03-21 ENCOUNTER — Encounter: Payer: Self-pay | Admitting: Internal Medicine

## 2020-03-21 ENCOUNTER — Other Ambulatory Visit: Payer: Self-pay | Admitting: Internal Medicine

## 2020-03-21 DIAGNOSIS — R300 Dysuria: Secondary | ICD-10-CM

## 2020-03-21 LAB — URINALYSIS, ROUTINE W REFLEX MICROSCOPIC
Ketones, ur: NEGATIVE
Nitrite: NEGATIVE
Specific Gravity, Urine: 1.03 (ref 1.000–1.030)
Total Protein, Urine: 30 — AB
Urine Glucose: >1000 — AB
Urobilinogen, UA: 0.2 (ref 0.0–1.0)
pH: 6 (ref 5.0–8.0)

## 2020-03-21 MED ORDER — CIPROFLOXACIN HCL 500 MG PO TABS: 500.0000 mg | ORAL_TABLET | Freq: Two times a day (BID) | ORAL | 0 refills | Status: AC

## 2020-03-22 LAB — URINE CULTURE
MICRO NUMBER:: 10964560
SPECIMEN QUALITY:: ADEQUATE

## 2020-03-23 ENCOUNTER — Encounter: Payer: Self-pay | Admitting: Internal Medicine

## 2020-03-25 ENCOUNTER — Ambulatory Visit: Payer: Medicare Other | Admitting: Family

## 2020-03-31 ENCOUNTER — Ambulatory Visit: Payer: Medicare Other | Admitting: Family

## 2020-03-31 DIAGNOSIS — I10 Essential (primary) hypertension: Secondary | ICD-10-CM | POA: Diagnosis not present

## 2020-03-31 DIAGNOSIS — F17290 Nicotine dependence, other tobacco product, uncomplicated: Secondary | ICD-10-CM | POA: Diagnosis not present

## 2020-03-31 DIAGNOSIS — I639 Cerebral infarction, unspecified: Secondary | ICD-10-CM | POA: Diagnosis not present

## 2020-03-31 DIAGNOSIS — E119 Type 2 diabetes mellitus without complications: Secondary | ICD-10-CM | POA: Diagnosis not present

## 2020-03-31 DIAGNOSIS — E785 Hyperlipidemia, unspecified: Secondary | ICD-10-CM | POA: Diagnosis not present

## 2020-04-05 ENCOUNTER — Inpatient Hospital Stay (HOSPITAL_COMMUNITY)
Admission: EM | Admit: 2020-04-05 | Discharge: 2020-04-10 | DRG: 872 | Disposition: A | Discharge status: Skilled Nursing Facility | Payer: Medicare Other | Attending: Internal Medicine | Admitting: Internal Medicine

## 2020-04-05 ENCOUNTER — Emergency Department (HOSPITAL_COMMUNITY): Payer: Medicare Other

## 2020-04-05 ENCOUNTER — Other Ambulatory Visit: Payer: Self-pay

## 2020-04-05 ENCOUNTER — Encounter (HOSPITAL_COMMUNITY): Payer: Self-pay | Admitting: Emergency Medicine

## 2020-04-05 ENCOUNTER — Inpatient Hospital Stay (HOSPITAL_COMMUNITY): Payer: Medicare Other

## 2020-04-05 DIAGNOSIS — R2689 Other abnormalities of gait and mobility: Secondary | ICD-10-CM | POA: Diagnosis not present

## 2020-04-05 DIAGNOSIS — E441 Mild protein-calorie malnutrition: Secondary | ICD-10-CM | POA: Diagnosis present

## 2020-04-05 DIAGNOSIS — E875 Hyperkalemia: Secondary | ICD-10-CM | POA: Diagnosis present

## 2020-04-05 DIAGNOSIS — I10 Essential (primary) hypertension: Secondary | ICD-10-CM | POA: Diagnosis not present

## 2020-04-05 DIAGNOSIS — E782 Mixed hyperlipidemia: Secondary | ICD-10-CM | POA: Diagnosis not present

## 2020-04-05 DIAGNOSIS — S79929A Unspecified injury of unspecified thigh, initial encounter: Secondary | ICD-10-CM | POA: Diagnosis not present

## 2020-04-05 DIAGNOSIS — Z515 Encounter for palliative care: Secondary | ICD-10-CM

## 2020-04-05 DIAGNOSIS — E785 Hyperlipidemia, unspecified: Secondary | ICD-10-CM | POA: Diagnosis present

## 2020-04-05 DIAGNOSIS — I69351 Hemiplegia and hemiparesis following cerebral infarction affecting right dominant side: Secondary | ICD-10-CM | POA: Diagnosis not present

## 2020-04-05 DIAGNOSIS — S72491A Other fracture of lower end of right femur, initial encounter for closed fracture: Secondary | ICD-10-CM | POA: Diagnosis not present

## 2020-04-05 DIAGNOSIS — Z8249 Family history of ischemic heart disease and other diseases of the circulatory system: Secondary | ICD-10-CM

## 2020-04-05 DIAGNOSIS — R9431 Abnormal electrocardiogram [ECG] [EKG]: Secondary | ICD-10-CM | POA: Diagnosis not present

## 2020-04-05 DIAGNOSIS — Z794 Long term (current) use of insulin: Secondary | ICD-10-CM | POA: Diagnosis not present

## 2020-04-05 DIAGNOSIS — R0689 Other abnormalities of breathing: Secondary | ICD-10-CM | POA: Diagnosis not present

## 2020-04-05 DIAGNOSIS — R0902 Hypoxemia: Secondary | ICD-10-CM | POA: Diagnosis not present

## 2020-04-05 DIAGNOSIS — I452 Bifascicular block: Secondary | ICD-10-CM | POA: Diagnosis not present

## 2020-04-05 DIAGNOSIS — Z20822 Contact with and (suspected) exposure to covid-19: Secondary | ICD-10-CM | POA: Diagnosis present

## 2020-04-05 DIAGNOSIS — Z7189 Other specified counseling: Secondary | ICD-10-CM | POA: Diagnosis not present

## 2020-04-05 DIAGNOSIS — Z83438 Family history of other disorder of lipoprotein metabolism and other lipidemia: Secondary | ICD-10-CM

## 2020-04-05 DIAGNOSIS — E872 Acidosis, unspecified: Secondary | ICD-10-CM

## 2020-04-05 DIAGNOSIS — R52 Pain, unspecified: Secondary | ICD-10-CM | POA: Diagnosis not present

## 2020-04-05 DIAGNOSIS — N179 Acute kidney failure, unspecified: Secondary | ICD-10-CM | POA: Diagnosis present

## 2020-04-05 DIAGNOSIS — M6258 Muscle wasting and atrophy, not elsewhere classified, other site: Secondary | ICD-10-CM | POA: Diagnosis not present

## 2020-04-05 DIAGNOSIS — R945 Abnormal results of liver function studies: Secondary | ICD-10-CM | POA: Diagnosis not present

## 2020-04-05 DIAGNOSIS — E876 Hypokalemia: Secondary | ICD-10-CM | POA: Diagnosis present

## 2020-04-05 DIAGNOSIS — E1159 Type 2 diabetes mellitus with other circulatory complications: Secondary | ICD-10-CM

## 2020-04-05 DIAGNOSIS — E869 Volume depletion, unspecified: Secondary | ICD-10-CM | POA: Diagnosis present

## 2020-04-05 DIAGNOSIS — M6281 Muscle weakness (generalized): Secondary | ICD-10-CM | POA: Diagnosis not present

## 2020-04-05 DIAGNOSIS — R1312 Dysphagia, oropharyngeal phase: Secondary | ICD-10-CM | POA: Diagnosis not present

## 2020-04-05 DIAGNOSIS — Z7401 Bed confinement status: Secondary | ICD-10-CM | POA: Diagnosis not present

## 2020-04-05 DIAGNOSIS — Z87891 Personal history of nicotine dependence: Secondary | ICD-10-CM | POA: Diagnosis not present

## 2020-04-05 DIAGNOSIS — W1830XA Fall on same level, unspecified, initial encounter: Secondary | ICD-10-CM | POA: Diagnosis present

## 2020-04-05 DIAGNOSIS — I110 Hypertensive heart disease with heart failure: Secondary | ICD-10-CM | POA: Diagnosis present

## 2020-04-05 DIAGNOSIS — R54 Age-related physical debility: Secondary | ICD-10-CM | POA: Diagnosis present

## 2020-04-05 DIAGNOSIS — S72401A Unspecified fracture of lower end of right femur, initial encounter for closed fracture: Secondary | ICD-10-CM | POA: Diagnosis not present

## 2020-04-05 DIAGNOSIS — A419 Sepsis, unspecified organism: Secondary | ICD-10-CM | POA: Diagnosis not present

## 2020-04-05 DIAGNOSIS — I509 Heart failure, unspecified: Secondary | ICD-10-CM | POA: Diagnosis present

## 2020-04-05 DIAGNOSIS — Z79899 Other long term (current) drug therapy: Secondary | ICD-10-CM

## 2020-04-05 DIAGNOSIS — Z6829 Body mass index (BMI) 29.0-29.9, adult: Secondary | ICD-10-CM

## 2020-04-05 DIAGNOSIS — I2510 Atherosclerotic heart disease of native coronary artery without angina pectoris: Secondary | ICD-10-CM | POA: Diagnosis present

## 2020-04-05 DIAGNOSIS — E119 Type 2 diabetes mellitus without complications: Secondary | ICD-10-CM | POA: Diagnosis not present

## 2020-04-05 DIAGNOSIS — G8111 Spastic hemiplegia affecting right dominant side: Secondary | ICD-10-CM | POA: Diagnosis not present

## 2020-04-05 DIAGNOSIS — N390 Urinary tract infection, site not specified: Secondary | ICD-10-CM | POA: Diagnosis not present

## 2020-04-05 DIAGNOSIS — R600 Localized edema: Secondary | ICD-10-CM | POA: Diagnosis not present

## 2020-04-05 DIAGNOSIS — S72401D Unspecified fracture of lower end of right femur, subsequent encounter for closed fracture with routine healing: Secondary | ICD-10-CM | POA: Diagnosis not present

## 2020-04-05 DIAGNOSIS — S72454A Nondisplaced supracondylar fracture without intracondylar extension of lower end of right femur, initial encounter for closed fracture: Secondary | ICD-10-CM | POA: Diagnosis not present

## 2020-04-05 DIAGNOSIS — R7989 Other specified abnormal findings of blood chemistry: Secondary | ICD-10-CM | POA: Diagnosis present

## 2020-04-05 DIAGNOSIS — R278 Other lack of coordination: Secondary | ICD-10-CM | POA: Diagnosis not present

## 2020-04-05 DIAGNOSIS — I959 Hypotension, unspecified: Secondary | ICD-10-CM | POA: Diagnosis not present

## 2020-04-05 DIAGNOSIS — I361 Nonrheumatic tricuspid (valve) insufficiency: Secondary | ICD-10-CM | POA: Diagnosis not present

## 2020-04-05 DIAGNOSIS — M2550 Pain in unspecified joint: Secondary | ICD-10-CM | POA: Diagnosis not present

## 2020-04-05 DIAGNOSIS — Z91014 Allergy to mammalian meats: Secondary | ICD-10-CM

## 2020-04-05 DIAGNOSIS — E1165 Type 2 diabetes mellitus with hyperglycemia: Secondary | ICD-10-CM | POA: Diagnosis present

## 2020-04-05 DIAGNOSIS — E11649 Type 2 diabetes mellitus with hypoglycemia without coma: Secondary | ICD-10-CM | POA: Diagnosis not present

## 2020-04-05 DIAGNOSIS — G459 Transient cerebral ischemic attack, unspecified: Secondary | ICD-10-CM | POA: Diagnosis not present

## 2020-04-05 DIAGNOSIS — Z7901 Long term (current) use of anticoagulants: Secondary | ICD-10-CM | POA: Diagnosis not present

## 2020-04-05 DIAGNOSIS — R41841 Cognitive communication deficit: Secondary | ICD-10-CM | POA: Diagnosis not present

## 2020-04-05 DIAGNOSIS — M7989 Other specified soft tissue disorders: Secondary | ICD-10-CM | POA: Diagnosis not present

## 2020-04-05 DIAGNOSIS — I5189 Other ill-defined heart diseases: Secondary | ICD-10-CM

## 2020-04-05 DIAGNOSIS — R000 Tachycardia, unspecified: Secondary | ICD-10-CM | POA: Diagnosis not present

## 2020-04-05 DIAGNOSIS — I5030 Unspecified diastolic (congestive) heart failure: Secondary | ICD-10-CM | POA: Diagnosis not present

## 2020-04-05 DIAGNOSIS — R0602 Shortness of breath: Secondary | ICD-10-CM | POA: Diagnosis not present

## 2020-04-05 DIAGNOSIS — S8991XA Unspecified injury of right lower leg, initial encounter: Secondary | ICD-10-CM | POA: Diagnosis not present

## 2020-04-05 DIAGNOSIS — J9811 Atelectasis: Secondary | ICD-10-CM | POA: Diagnosis not present

## 2020-04-05 DIAGNOSIS — S72451A Displaced supracondylar fracture without intracondylar extension of lower end of right femur, initial encounter for closed fracture: Secondary | ICD-10-CM | POA: Diagnosis not present

## 2020-04-05 DIAGNOSIS — F329 Major depressive disorder, single episode, unspecified: Secondary | ICD-10-CM | POA: Diagnosis not present

## 2020-04-05 LAB — COMPREHENSIVE METABOLIC PANEL
ALT: 45 U/L — ABNORMAL HIGH (ref 0–44)
AST: 68 U/L — ABNORMAL HIGH (ref 15–41)
Albumin: 3.2 g/dL — ABNORMAL LOW (ref 3.5–5.0)
Alkaline Phosphatase: 74 U/L (ref 38–126)
Anion gap: 13 (ref 5–15)
BUN: 13 mg/dL (ref 8–23)
CO2: 18 mmol/L — ABNORMAL LOW (ref 22–32)
Calcium: 8.4 mg/dL — ABNORMAL LOW (ref 8.9–10.3)
Chloride: 107 mmol/L (ref 98–111)
Creatinine, Ser: 1.37 mg/dL — ABNORMAL HIGH (ref 0.44–1.00)
GFR calc Af Amer: 44 mL/min — ABNORMAL LOW (ref >60)
GFR calc non Af Amer: 38 mL/min — ABNORMAL LOW (ref >60)
Glucose, Bld: 308 mg/dL — ABNORMAL HIGH (ref 70–99)
Potassium: 5.3 mmol/L — ABNORMAL HIGH (ref 3.5–5.1)
Sodium: 138 mmol/L (ref 135–145)
Total Bilirubin: 1.6 mg/dL — ABNORMAL HIGH (ref 0.3–1.2)
Total Protein: 5.9 g/dL — ABNORMAL LOW (ref 6.5–8.1)

## 2020-04-05 LAB — URINALYSIS, ROUTINE W REFLEX MICROSCOPIC
Bilirubin Urine: NEGATIVE
Glucose, UA: 50 mg/dL — AB
Ketones, ur: NEGATIVE mg/dL
Nitrite: NEGATIVE
Protein, ur: NEGATIVE mg/dL
RBC / HPF: >50 RBC/hpf — ABNORMAL HIGH (ref 0–5)
Specific Gravity, Urine: 1.012 (ref 1.005–1.030)
WBC, UA: >50 WBC/hpf — ABNORMAL HIGH (ref 0–5)
pH: 5 (ref 5.0–8.0)

## 2020-04-05 LAB — CBC WITH DIFFERENTIAL/PLATELET
Abs Immature Granulocytes: 0.06 10*3/uL (ref 0.00–0.07)
Basophils Absolute: 0 10*3/uL (ref 0.0–0.1)
Basophils Relative: 0 %
Eosinophils Absolute: 0 10*3/uL (ref 0.0–0.5)
Eosinophils Relative: 0 %
HCT: 41.4 % (ref 36.0–46.0)
Hemoglobin: 12.5 g/dL (ref 12.0–15.0)
Immature Granulocytes: 0 %
Lymphocytes Relative: 8 %
Lymphs Abs: 1.1 10*3/uL (ref 0.7–4.0)
MCH: 27.5 pg (ref 26.0–34.0)
MCHC: 30.2 g/dL (ref 30.0–36.0)
MCV: 91.2 fL (ref 80.0–100.0)
Monocytes Absolute: 0.6 10*3/uL (ref 0.1–1.0)
Monocytes Relative: 4 %
Neutro Abs: 12.7 10*3/uL — ABNORMAL HIGH (ref 1.7–7.7)
Neutrophils Relative %: 88 %
Platelets: 200 10*3/uL (ref 150–400)
RBC: 4.54 MIL/uL (ref 3.87–5.11)
RDW: 13 % (ref 11.5–15.5)
WBC: 14.5 10*3/uL — ABNORMAL HIGH (ref 4.0–10.5)
nRBC: 0 % (ref 0.0–0.2)

## 2020-04-05 LAB — CBG MONITORING, ED: Glucose-Capillary: 195 mg/dL — ABNORMAL HIGH (ref 70–99)

## 2020-04-05 LAB — LACTIC ACID, PLASMA: Lactic Acid, Venous: 6.2 mmol/L (ref 0.5–1.9)

## 2020-04-05 LAB — BRAIN NATRIURETIC PEPTIDE: B Natriuretic Peptide: 124.8 pg/mL — ABNORMAL HIGH (ref 0.0–100.0)

## 2020-04-05 LAB — TYPE AND SCREEN
ABO/RH(D): A POS
Antibody Screen: NEGATIVE

## 2020-04-05 LAB — CK: Total CK: 233 U/L (ref 38–234)

## 2020-04-05 LAB — TROPONIN I (HIGH SENSITIVITY): Troponin I (High Sensitivity): 36 ng/L — ABNORMAL HIGH (ref <18)

## 2020-04-05 LAB — RESPIRATORY PANEL BY RT PCR (FLU A&B, COVID)
Influenza A by PCR: NEGATIVE
Influenza B by PCR: NEGATIVE
SARS Coronavirus 2 by RT PCR: NEGATIVE

## 2020-04-05 LAB — AMMONIA: Ammonia: 25 umol/L (ref 9–35)

## 2020-04-05 LAB — POC OCCULT BLOOD, ED: Fecal Occult Bld: NEGATIVE

## 2020-04-05 MED ORDER — FENTANYL CITRATE (PF) 100 MCG/2ML IJ SOLN
25.0000 ug | INTRAMUSCULAR | Status: DC | PRN
  Administered 2020-04-05: 25 ug via INTRAVENOUS
  Filled 2020-04-05: qty 2

## 2020-04-05 MED ORDER — LACTATED RINGERS IV SOLN: INTRAVENOUS | Status: AC

## 2020-04-05 MED ORDER — ONDANSETRON HCL 4 MG PO TABS: 4.0000 mg | ORAL_TABLET | Freq: Four times a day (QID) | ORAL | Status: DC | PRN

## 2020-04-05 MED ORDER — BACLOFEN 10 MG PO TABS
10.0000 mg | ORAL_TABLET | Freq: Three times a day (TID) | ORAL | Status: DC | PRN
  Administered 2020-04-05 – 2020-04-10 (×4): 10 mg via ORAL
  Filled 2020-04-05 (×5): qty 1

## 2020-04-05 MED ORDER — LACTATED RINGERS IV BOLUS (SEPSIS)
1000.0000 mL | Freq: Once | INTRAVENOUS | Status: AC
  Administered 2020-04-05: 1000 mL via INTRAVENOUS

## 2020-04-05 MED ORDER — SODIUM CHLORIDE 0.9 % IV SOLN
2.0000 g | Freq: Once | INTRAVENOUS | Status: AC
  Administered 2020-04-05: 2 g via INTRAVENOUS
  Filled 2020-04-05: qty 2

## 2020-04-05 MED ORDER — MAGNESIUM SULFATE 2 GM/50ML IV SOLN
2.0000 g | Freq: Once | INTRAVENOUS | Status: AC
  Administered 2020-04-06: 2 g via INTRAVENOUS
  Filled 2020-04-05: qty 50

## 2020-04-05 MED ORDER — SODIUM CHLORIDE 0.9 % IV SOLN: 1.0000 g | Freq: Once | INTRAVENOUS | Status: DC

## 2020-04-05 MED ORDER — LACTATED RINGERS IV BOLUS (SEPSIS)
250.0000 mL | Freq: Once | INTRAVENOUS | Status: AC
  Administered 2020-04-05: 250 mL via INTRAVENOUS

## 2020-04-05 MED ORDER — ONDANSETRON HCL 4 MG/2ML IJ SOLN: 4.0000 mg | Freq: Four times a day (QID) | INTRAMUSCULAR | Status: DC | PRN

## 2020-04-05 MED ORDER — PROCHLORPERAZINE EDISYLATE 10 MG/2ML IJ SOLN
5.0000 mg | INTRAMUSCULAR | Status: DC | PRN
  Filled 2020-04-05: qty 1

## 2020-04-05 MED ORDER — SODIUM CHLORIDE 0.9 % IV SOLN
2.0000 g | Freq: Two times a day (BID) | INTRAVENOUS | Status: DC
  Administered 2020-04-06 – 2020-04-07 (×4): 2 g via INTRAVENOUS
  Filled 2020-04-05 (×4): qty 2

## 2020-04-05 MED ORDER — INSULIN ASPART 100 UNIT/ML ~~LOC~~ SOLN
0.0000 [IU] | Freq: Three times a day (TID) | SUBCUTANEOUS | Status: DC
  Administered 2020-04-07: 5 [IU] via SUBCUTANEOUS
  Administered 2020-04-07: 3 [IU] via SUBCUTANEOUS
  Administered 2020-04-08: 2 [IU] via SUBCUTANEOUS
  Administered 2020-04-09 – 2020-04-10 (×2): 5 [IU] via SUBCUTANEOUS

## 2020-04-05 MED ORDER — ACETAMINOPHEN 325 MG PO TABS
650.0000 mg | ORAL_TABLET | Freq: Four times a day (QID) | ORAL | Status: DC | PRN
  Administered 2020-04-05 – 2020-04-10 (×8): 650 mg via ORAL
  Filled 2020-04-05 (×9): qty 2

## 2020-04-05 NOTE — ED Provider Notes (Addendum)
[facility] EMERGENCY DEPARTMENT Provider Note   CSN: [id number] Arrival date & time: 04/05/20  1721     History Chief Complaint  Patient presents with  . Fatigue    [name] is a 74 y.o. female.  Patient is a 74 year old female with a history of prior CVA, diabetes, hypertension, hyperlipidemia, CAD, CHF with an EF of 50 to 55% in 2016 and prior anemia who is presenting today from home with EMS.  EMS was called out for a lift assist and patient had reported she has had increasing weakness for the last week.  Family member reported she had been treated for a UTI 9 days ago but she was not getting any better.  Patient only gives brief answers and cannot give a full history but she denies any chest pain, shortness of breath, cough, abdominal pain.  She denies any vomiting but does admit to diarrhea.  She denies any pain but when moving her legs she states they hurt.  She does have swelling in her legs which she reports is unchanged.  Uncertain if patient takes any diuretics.  She is not aware of any kidney or liver problems.  The history is provided by the patient and the EMS personnel. The history is limited by the absence of a caregiver and the condition of the patient.       Past Medical History:  Diagnosis Date  . Acute blood loss anemia   . CHF (congestive heart failure) (HCC)    EF 18-84%, grade 1 diastolic dysfunction per echo 04/2015  . 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 CVA (cerebral infarction) 04/2015   Started Plavix 04/2015  . Depression   . Diabetes ([place]) 2016   Type II. On insulin  . Enchondroma of bone 2011   left femur.   . Fracture, intertrochanteric, right femur ([place])   . History of lower GI bleeding   . Hyperlipidemia   . Hypertension   . Patent foramen ovale 04/2015   Started on warfarin 04/2015  . Protein calorie malnutrition ([place])   . Stercoral ulcer of rectum  05/16/2015    Patient Active Problem List   Diagnosis Date Noted  . Dysuria 06/12/2019  . Preventative health care 08/18/2018  . Partial symptomatic epilepsy with complex partial seizures, not intractable, without status epilepticus ([place]) 12/08/2017  . Shingles 10/26/2017  . Constipation 04/20/2017  . Skin ulcer of right heel, limited to breakdown of skin ([place]) 02/10/2017  . Muscle spasticity 01/20/2016  . Bilateral lower extremity edema 12/23/2015  . Acute confusional state 10/28/2015  . Right spastic hemiparesis ([place]) 10/07/2015  . Right shoulder pain 07/24/2015  . Fracture, intertrochanteric, right femur ([place]) 06/10/2015  . Stercoral ulcer of rectum 05/16/2015  . GI bleed 05/13/2015  . BRBPR (bright red blood per rectum) 05/13/2015  . CHF (congestive heart failure) ([place])   . Diabetes ([place])   . Hypertension   . History of stroke   . Lower GI bleed   . Left-sided cerebrovascular accident (CVA) ([place]) 04/28/2015    Past Surgical History:  Procedure Laterality Date  . CARDIAC SURGERY     Cath without stent  . COLONOSCOPY N/A 05/15/2015   Procedure: COLONOSCOPY;  Surgeon: Mauri Pole, MD;  Location: [facility] ENDOSCOPY;  Service: Endoscopy;  Laterality: N/A;  . FLEXIBLE SIGMOIDOSCOPY N/A 05/15/2015   Procedure: FLEXIBLE SIGMOIDOSCOPY;  Surgeon: Mauri Pole, MD;  Location: [facility] ENDOSCOPY;  Service: Endoscopy;  Laterality: N/A;  at bedside  . INTRAMEDULLARY (IM) NAIL INTERTROCHANTERIC Right 06/12/2015   Procedure: INTRAMEDULLARY (IM) NAIL RIGHT HIP;  Surgeon: Renette Butters, MD;  Location: [facility];  Service: Orthopedics;  Laterality: Right;  . TEE WITHOUT CARDIOVERSION N/A 05/05/2015   Procedure: TRANSESOPHAGEAL ECHOCARDIOGRAM (TEE);  Surgeon: Dixie Dials, MD;  Location: [facility] ENDOSCOPY;  Service: Cardiovascular;  Laterality: N/A;     OB History   No obstetric history on file.     Family History  Problem Relation Age of Onset  . Hypertension Mother   . Heart failure  Mother   . Hyperlipidemia Mother   . Heart attack Father     Social History   Tobacco Use  . Smoking status: Former Smoker    Types: Cigarettes    Quit date: 04/05/2015    Years since quitting: 5.0  . Smokeless tobacco: Never Used  . Tobacco comment: Quit 04/28/15  Vaping Use  . Vaping Use: Never used  Substance Use Topics  . Alcohol use: No    Alcohol/week: 0.0 standard drinks  . Drug use: No    Home Medications Prior to Admission medications   Medication Sig Start Date End Date Taking? Authorizing Provider  atorvastatin (LIPITOR) 40 MG tablet Take 1 tablet (40 mg total) by mouth daily. 05/06/15   Charolette Forward, MD  baclofen (LIORESAL) 10 MG tablet Take 1 tablet (10 mg total) by mouth 3 (three) times daily. 02/06/19   Marcial Pacas, MD  ELIQUIS 5 MG TABS tablet  12/30/19   [provider]  gabapentin (NEURONTIN) 300 MG capsule Take 1 capsule (300 mg total) by mouth 3 (three) times daily. 02/06/19   Marcial Pacas, MD  IncobotulinumtoxinA (XEOMIN IM) Inject 500 Units into the muscle every 3 (three) months.    [provider]  insulin detemir (LEVEMIR) 100 UNIT/ML injection INJECT 50 UNITS SUBCUTANEOUSLY ONCE DAILY 08/29/19   Biagio Borg, MD  levETIRAcetam (KEPPRA) 500 MG tablet Take 1 tablet (500 mg total) by mouth 2 (two) times daily. 03/17/20   Marcial Pacas, MD  losartan (COZAAR) 25 MG tablet  12/30/19   [provider]  metoprolol succinate (TOPROL-XL) 25 MG 24 hr tablet  12/30/19   [provider]  nitroGLYCERIN (NITROSTAT) 0.4 MG SL tablet Place 0.4 mg under the tongue every 5 (five) minutes as needed for chest pain (up to 3 doses.  If no relief call MD/NP).     [provider]    Allergies    Pork-derived products  Review of Systems   Review of Systems  Unable to perform ROS: Mental status change  All other systems reviewed and are negative.   Physical Exam Updated Vital Signs BP 111/77   Pulse 78   Temp 99.5 F (37.5 C) (Rectal)    Resp 18   Ht 5\' 6"  (1.676 m)   Wt 68.9 kg   SpO2 (!) 85%   BMI 24.52 kg/m   Physical Exam Vitals and nursing note reviewed.  Constitutional:      General: She is not in acute distress.    Appearance: Normal appearance. She is well-developed. She is obese. She is ill-appearing.  HENT:     Head: Normocephalic and atraumatic.     Mouth/Throat:     Mouth: Mucous membranes are dry.  Eyes:     Pupils: Pupils are equal, round, and reactive to light.     Comments: Pale conjunctiva  Cardiovascular:     Rate and Rhythm: Regular rhythm. Tachycardia  present.     Heart sounds: Normal heart sounds. No murmur heard.  No friction rub.     Comments: Pulses are not palpable in the feet but 1+ radial pulse in bilateral wrists Pulmonary:     Effort: Pulmonary effort is normal.     Breath sounds: Examination of the right-lower field reveals decreased breath sounds. Examination of the left-lower field reveals decreased breath sounds. Decreased breath sounds present. No wheezing or rales.  Abdominal:     General: Bowel sounds are normal. There is no distension.     Palpations: Abdomen is soft.     Tenderness: There is no abdominal tenderness. There is no guarding or rebound.  Musculoskeletal:        General: No tenderness. Normal range of motion.     Cervical back: Normal range of motion and neck supple.     Right lower leg: Edema present.     Left lower leg: Edema present.     Comments: 3+ pitting edema in lower legs bilaterally but then also 1+ pitting edema in bilateral thighs.  Skin discoloration and peeling present on the back portion of the right lower extremity.  No erythema or warmth present.  Skin:    General: Skin is warm and dry.     Coloration: Skin is pale.     Findings: No rash.  Neurological:     Mental Status: She is alert.     Motor: Weakness present.     Comments: Right upper and lower ext weakness.  Able to move the left upper extremity and left lower extremity with minimal  difficulty. oriented to person and place but not to time  Psychiatric:     Comments: Calm and cooperative but seems slightly confused      ED Results / Procedures / Treatments   Labs (all labs ordered are listed, but only abnormal results are displayed) Labs Reviewed  CBC WITH DIFFERENTIAL/PLATELET - Abnormal; Notable for the following components:      Result Value   WBC 14.5 (*)    Neutro Abs 12.7 (*)    All other components within normal limits  COMPREHENSIVE METABOLIC PANEL - Abnormal; Notable for the following components:   Potassium 5.3 (*)    CO2 18 (*)    Glucose, Bld 308 (*)    Creatinine, Ser 1.37 (*)    Calcium 8.4 (*)    Total Protein 5.9 (*)    Albumin 3.2 (*)    AST 68 (*)    ALT 45 (*)    Total Bilirubin 1.6 (*)    GFR calc non Af Amer 38 (*)    GFR calc Af Amer 44 (*)    All other components within normal limits  BRAIN NATRIURETIC PEPTIDE - Abnormal; Notable for the following components:   B Natriuretic Peptide 124.8 (*)    All other components within normal limits  LACTIC ACID, PLASMA - Abnormal; Notable for the following components:   Lactic Acid, Venous 6.2 (*)    All other components within normal limits  URINALYSIS, ROUTINE W REFLEX MICROSCOPIC - Abnormal; Notable for the following components:   APPearance HAZY (*)    Glucose, UA 50 (*)    Hgb urine dipstick LARGE (*)    Leukocytes,Ua LARGE (*)    RBC / HPF >50 (*)    WBC, UA >50 (*)    Bacteria, UA FEW (*)    All other components within normal limits  TROPONIN I (HIGH SENSITIVITY) - Abnormal; Notable  for the following components:   Troponin I (High Sensitivity) 36 (*)    All other components within normal limits  RESPIRATORY PANEL BY RT PCR (FLU A&B, COVID)  URINE CULTURE  CULTURE, BLOOD (ROUTINE X 2)  CULTURE, BLOOD (ROUTINE X 2)  CK  AMMONIA  POC OCCULT BLOOD, ED  TYPE AND SCREEN  TROPONIN I (HIGH SENSITIVITY)    EKG EKG Interpretation  Date/Time:  Saturday April 05 2020 17:46:40  EDT Ventricular Rate:  106 PR Interval:    QRS Duration: 100 QT Interval:  392 QTC Calculation: 521 R Axis:   -46 Text Interpretation: Sinus tachycardia Incomplete RBBB and LAFB Low voltage, precordial leads Abnormal R-wave progression, early transition Consider anterior infarct new Prolonged QT interval Confirmed by Blanchie Dessert [id number]) on 04/05/2020 5:51:12 PM   Radiology DG Chest Port 1 View  Result Date: 04/05/2020 CLINICAL DATA:  Shortness of breath. EXAM: PORTABLE CHEST 1 VIEW COMPARISON:  June 10, 2015 FINDINGS: Decreased lung volumes are seen which is likely secondary to the degree of patient inspiration. Very mild atelectasis is seen within the left lung base. There is no evidence of a pleural effusion or pneumothorax. The cardiac silhouette is mildly enlarged and unchanged in size. There is marked severity calcification of the thoracic aorta. The visualized skeletal structures are unremarkable. IMPRESSION: Very mild left basilar atelectasis. Electronically Signed   By: Virgina Norfolk M.D.   On: 04/05/2020 18:52    Procedures Procedures (including critical care time)  Medications Ordered in ED Medications - No data to display  ED Course  I have reviewed the triage vital signs and the nursing notes.  Pertinent labs & imaging results that were available during my care of the patient were reviewed by me and considered in my medical decision making (see chart for details).    MDM Rules/Calculators/A&P                          Patient with multiple medical problems presenting today by EMS initially made a code sepsis in route for recent diagnosis of UTI, tachycardia and hypotension.  Patient denies any pain at this time however she is pale in appearance and has been having diarrhea.  She has no abdominal pain but does have evidence of fluid overload.  She was satting 93% on room air and is tachypneic but is not wheezing.  She has no history of COPD or asthma.  She has no  wheezing at this time. Patient has had a prior stroke and has chronic right-sided weakness and also has seizures and is taking Keppra.  Patient's blood sugar today was within normal limits.  She is anticoagulated with Eliquis. We will have to obtain more history from patient's son who she lives with however today she appears fluid overloaded however also concern for possible infection versus anemia as she is also very pale.  She is afebrile here with a rectal temperature of 99.5.  She is tachycardic and will need to do a repeat blood pressure.  Will hold on fluid until we get a better picture of the cause of her illness today.  Labs, imaging are pending.  EKG shows sinus tachycardia and prolonged QT interval.  7:15 PM Patient CBC with a leukocytosis of 14.5 but normal hemoglobin of 12, Hemoccult is negative, chest x-ray without significant signs of infiltrate.  UA does not appear infected however when looking back at prior UAs they all look similar and all cultures have always  had multiple species growth.  However her lactic acid came back at 6.2, CMP with potassium of 5.2 but most likely mild hemolysis as patient has normal renal function, mild elevation of LFTs and total bilirubin of 1.6.  Urine cultures and blood cultures are pending.  Patient now that it is definitive sepsis was started on the evolving sepsis order set.  She was ordered 30/kg of fluid and given cefepime.  On reevaluation patient is unchanged.  Vital signs remained stable.  Troponin is mildly elevated at 36 but EKG shows no evidence of ST changes.  CK within normal limits.  8:31 PM Son reported that today when they were helping her get off the toilet she was unable to stand and she had to be slid down into the floor in her right leg was in a unusual position.  She did have pain in her leg with movement and he was concerned she may have injury.  We will do plain films of the femur and tib-fib to ensure there is no acute fracture.  10:22  PM X-rays have returned and show a new acute impacted right supracondylar fracture of the distal femur.  Will consult with orthopedics. Who recommended CT of knee and immobilizer.  They will consult.  MDM Number of Diagnoses or Management Options   Amount and/or Complexity of Data Reviewed Clinical lab tests: ordered and reviewed Tests in the radiology section of CPT: reviewed and ordered Tests in the medicine section of CPT: ordered and reviewed Decide to obtain previous medical records or to obtain history from someone other than the patient: yes Obtain history from someone other than the patient: yes Review and summarize past medical records: yes Discuss the patient with other providers: yes Independent visualization of images, tracings, or specimens: yes  Risk of Complications, Morbidity, and/or Mortality Presenting problems: high Diagnostic procedures: moderate Management options: moderate  Patient Progress Patient progress: stable  CRITICAL CARE Performed by: Crystina Borrayo Total critical care time: 30 minutes Critical care time was exclusive of separately billable procedures and treating other patients. Critical care was necessary to treat or prevent imminent or life-threatening deterioration. Critical care was time spent personally by me on the following activities: development of treatment plan with patient and/or surrogate as well as nursing, discussions with consultants, evaluation of patient's response to treatment, examination of patient, obtaining history from patient or surrogate, ordering and performing treatments and interventions, ordering and review of laboratory studies, ordering and review of radiographic studies, pulse oximetry and re-evaluation of patient's condition.   Final Clinical Impression(s) / ED Diagnoses Final diagnoses:  Sepsis without acute organ dysfunction, due to unspecified organism [facility])  Urinary tract infection with hematuria, site  unspecified  Lactic acidosis    Rx / DC Orders ED Discharge Orders    None       Blanchie Dessert, MD 04/05/20 2035    Blanchie Dessert, MD 04/05/20 2227

## 2020-04-05 NOTE — H&P (Signed)
History and Physical    [name] KCL:[id number] DOB: December 28, 1945 DOA: 04/05/2020  PCP: Biagio Borg, MD   Patient coming from: Home.   I have personally briefly reviewed patient's old medical records in [place]  Chief Complaint: Fatigue.  HPI: [name] is a 74 y.o. 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  They found the patient was weak and hypotensive.  The history is limited due to the patient's speech impairment.  History is taken mostly from her daughter (present in the ED) and her son [name] who we called from his sisters cell phone to obtain more information.  Apparently, she was being treated with ciprofloxacin for UTIs for the past 9 days.  Urine culture revealed mixed flora.  Her son stated the patient has also had some episodes of diarrhea recently.  She was also complaining of tenderness on her right sided extremities.  When asked, the patient stated that she she felt a little dyspneic, but denied headache, fever, chills, rhinorrhea, sore throat, wheezing, hemoptysis, chest pain, dizziness, diaphoresis, PND, orthopnea, but she gets frequent lower extremity edema.  No abdominal pain, diarrhea, constipation, melena or hematochezia.  She has been having urinary frequency, but her glucose may have been elevated.  ED Course: Initial vital signs were temperature 99.5 F, pulse 109, respirations 32, BP 147/88 mmHg O2 sat 94% on room air patient received 2250 mL of LR bolus and was  started on cefepime.  Orthopedic surgery was called and asked for CT of the right knee.  Labs: Her urinalysis was hazy with glucosuria 50 mg/dL, large hemoglobinuria, large leukocyte esterase.  There were more than 50 RBC and more than 50 WBC with a few bacteria on microscopic examination.  CBC had a white count of 14.5 with left shift deviation with 88% neutrophils, hemoglobin 12.5 g/dL and platelets 200.  Lactic acid was 6.2, sodium 138, potassium 5.3, chloride 107 and CO2 18 mmol/L.  Calcium 8.4, glucose was 300, BUN 13 and creatinine 1.37 mg/dL.  Total protein 5.9 and albumin 3.2 g/dL.  AST was 68 and ALT 45.  Total bilirubin was 1.6 mg/dL.  Alkaline phosphatase, ammonia, total CK a.m. fecal occult blood were negative.  Troponin was 36 ng/L and BNP 824.8 pg/mL.  Imaging: Her chest radiograph shows minimal atelectasis on the left base. Images of the right femur, tibia/fibula showed acute appearing impacted supracondylar fracture of the distal right femur.  There was no definite acute displaced fracture involving the tibia or fibula.  There is soft tissue swelling about the ankle.  Please review films and full radiology report for further detail.  Review of Systems: As per HPI otherwise all other systems reviewed and are negative.  Past Medical History:  Diagnosis Date  . Acute blood loss anemia   . CHF (congestive heart failure) (HCC)    EF 49-44%, grade 1 diastolic dysfunction per echo 04/2015  . 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 CVA (cerebral infarction) 04/2015   Started Plavix 04/2015  .  Depression   . Diabetes ([place]) 2016   Type II. On insulin  . Enchondroma of bone 2011   left femur.   . Fracture, intertrochanteric, right femur ([place])   . History of lower GI bleeding   . Hyperlipidemia   . Hypertension   . Patent foramen ovale 04/2015   Started on warfarin 04/2015  . Protein calorie malnutrition ([place])   . Stercoral  ulcer of rectum 05/16/2015   Past Surgical History:  Procedure Laterality Date  . CARDIAC SURGERY     Cath without stent  . COLONOSCOPY N/A 05/15/2015   Procedure: COLONOSCOPY;  Surgeon: Mauri Pole, MD;  Location: [facility] ENDOSCOPY;  Service: Endoscopy;  Laterality: N/A;  . FLEXIBLE SIGMOIDOSCOPY N/A 05/15/2015   Procedure: FLEXIBLE SIGMOIDOSCOPY;  Surgeon: Mauri Pole, MD;  Location: [facility] ENDOSCOPY;  Service: Endoscopy;  Laterality: N/A;  at bedside  . INTRAMEDULLARY (IM) NAIL INTERTROCHANTERIC Right 06/12/2015   Procedure: INTRAMEDULLARY (IM) NAIL RIGHT HIP;  Surgeon: Renette Butters, MD;  Location: [facility];  Service: Orthopedics;  Laterality: Right;  . TEE WITHOUT CARDIOVERSION N/A 05/05/2015   Procedure: TRANSESOPHAGEAL ECHOCARDIOGRAM (TEE);  Surgeon: Dixie Dials, MD;  Location: [facility] ENDOSCOPY;  Service: Cardiovascular;  Laterality: N/A;   Social History  reports that she quit smoking about 5 years ago. Her smoking use included cigarettes. She has never used smokeless tobacco. She reports that she does not drink alcohol and does not use drugs.  Allergies  Allergen Reactions  . Pork-Derived Products Other (See Comments)    To keep blood pressure down    Family History  Problem Relation Age of Onset  . Hypertension Mother   . Heart failure Mother   . Hyperlipidemia Mother   . Heart attack Father    Prior to Admission medications   Medication Sig Start Date End Date Taking? Authorizing Provider  atorvastatin (LIPITOR) 40 MG tablet Take 1 tablet (40 mg total) by mouth daily. Patient taking differently: Take 20 mg by mouth daily.  05/06/15  Yes Charolette Forward, MD  ELIQUIS 5 MG TABS tablet Take 5 mg by mouth 2 (two) times daily.  12/30/19  Yes [provider]  insulin detemir (LEVEMIR) 100 UNIT/ML injection INJECT 50 UNITS SUBCUTANEOUSLY ONCE DAILY Patient taking differently: Inject 25 Units into the skin 2 (two) times daily. Take With Meals 08/29/19  Yes Biagio Borg,  MD  levETIRAcetam (KEPPRA) 500 MG tablet Take 1 tablet (500 mg total) by mouth 2 (two) times daily. 03/17/20  Yes Marcial Pacas, MD  losartan (COZAAR) 25 MG tablet Take 12.5 mg by mouth daily.  12/30/19  Yes [provider]  metoprolol succinate (TOPROL-XL) 25 MG 24 hr tablet Take 25 mg by mouth daily.  12/30/19  Yes [provider]  baclofen (LIORESAL) 10 MG tablet Take 1 tablet (10 mg total) by mouth 3 (three) times daily. Patient not taking: Reported on 04/05/2020 02/06/19   Marcial Pacas, MD  furosemide (LASIX) 20 MG tablet Take 20 mg by mouth daily. 03/31/20   [provider]  gabapentin (NEURONTIN) 300 MG capsule Take 1 capsule (300 mg total) by mouth 3 (three) times daily. Patient not taking: Reported on 04/05/2020 02/06/19   Marcial Pacas, MD   Physical Exam: Vitals:   04/05/20 1815 04/05/20 1830 04/05/20 2048 04/05/20 2130  BP: (!) 147/88 111/77 135/87 (!) 165/93  Pulse: (!) 101 78 99 (!) 121  Resp: 20 18 (!) 22 (!) 26  Temp:      TempSrc:  SpO2: 94% (!) 85% 95% 92%  Weight:      Height:       Constitutional: Looks chronically ill, but nontoxic. Eyes: PERRL, lids and conjunctivae are mildly injected. ENMT: Mucous membranes are dry. Posterior pharynx clear of any exudate or lesions. Neck: normal, supple, no masses, no thyromegaly Respiratory: Decreased breath sounds on bases, otherwise clear to auscultation bilaterally, no wheezing, no crackles. Normal respiratory effort. No accessory muscle use.  Cardiovascular: Regular rate and rhythm, no murmurs / rubs / gallops. No extremity edema. 2+ pedal pulses. No carotid bruits.  Abdomen: Nondistended.  BS positive.  Soft, no tenderness, no masses palpated. No hepatosplenomegaly. Musculoskeletal: no clubbing / cyanosis. Good ROM, no contractures.  Right side extremities have increased muscle tone.  Skin: Skin is pale, but there are no obvious clinically significant rashes, lesions, ulcers or induration. Neurologic: CN 2-12  grossly intact.  Dysarthric speech.  Decreased sensation on right, DTR normal on left side extremities.  Right-sided spastic hemiparesis. Psychiatric: Normal judgment and insight. Alert and oriented x 3. Normal mood.   Labs on Admission: I have personally reviewed following labs and imaging studies  CBC: Recent Labs  Lab 04/05/20 1820  WBC 14.5*  NEUTROABS 12.7*  HGB 12.5  HCT 41.4  MCV 91.2  PLT 270    Basic Metabolic Panel: Recent Labs  Lab 04/05/20 1820  NA 138  K 5.3*  CL 107  CO2 18*  GLUCOSE 308*  BUN 13  CREATININE 1.37*  CALCIUM 8.4*    GFR: Estimated Creatinine Clearance: 33.2 mL/min (A) (by C-G formula based on SCr of 1.37 mg/dL (H)).  Liver Function Tests: Recent Labs  Lab 04/05/20 1820  AST 68*  ALT 45*  ALKPHOS 74  BILITOT 1.6*  PROT 5.9*  ALBUMIN 3.2*    Urine analysis:    Component Value Date/Time   COLORURINE YELLOW 04/05/2020 1747   APPEARANCEUR HAZY (A) 04/05/2020 1747   LABSPEC 1.012 04/05/2020 1747   PHURINE 5.0 04/05/2020 1747   GLUCOSEU 50 (A) 04/05/2020 1747   GLUCOSEU >1000 mg/dL (A) 03/21/2020 1013   HGBUR LARGE (A) 04/05/2020 1747   BILIRUBINUR NEGATIVE 04/05/2020 1747   KETONESUR NEGATIVE 04/05/2020 1747   PROTEINUR NEGATIVE 04/05/2020 1747   UROBILINOGEN 0.2 03/21/2020 1013   NITRITE NEGATIVE 04/05/2020 1747   LEUKOCYTESUR LARGE (A) 04/05/2020 1747    Radiological Exams on Admission: DG Tibia/Fibula Right  Result Date: 04/05/2020 CLINICAL DATA:  Pain status post fall EXAM: RIGHT FEMUR PORTABLE 2 VIEW; RIGHT TIBIA AND FIBULA - 2 VIEW COMPARISON:  None. FINDINGS: There is no definite acute displaced fracture involving the tibia or fibula. Evaluation of nondisplaced fractures is limited by osteopenia. There is soft tissue swelling about the ankle. There is a small plantar calcaneal spur. Vascular calcifications are noted. The patient is status post prior intramedullary nail placement. The hardware appears grossly intact.  There is an acute appearing impacted supracondylar fracture of the distal right femur. There is surrounding soft tissue swelling. IMPRESSION: 1. Acute appearing impacted supracondylar fracture of the distal right femur. 2. No definite acute displaced fracture involving the tibia or fibula. Evaluation of nondisplaced fractures is limited by osteopenia. 3. Soft tissue swelling about the ankle. 4. Hardware appears grossly intact. Electronically Signed   By: Constance Holster M.D.   On: 04/05/2020 21:24   DG Chest Port 1 View  Result Date: 04/05/2020 CLINICAL DATA:  Shortness of breath. EXAM: PORTABLE CHEST 1 VIEW COMPARISON:  June 10, 2015 FINDINGS: Decreased lung volumes  are seen which is likely secondary to the degree of patient inspiration. Very mild atelectasis is seen within the left lung base. There is no evidence of a pleural effusion or pneumothorax. The cardiac silhouette is mildly enlarged and unchanged in size. There is marked severity calcification of the thoracic aorta. The visualized skeletal structures are unremarkable. IMPRESSION: Very mild left basilar atelectasis. Electronically Signed   By: Virgina Norfolk M.D.   On: 04/05/2020 18:52   DG Femur Portable Min 2 Views Right  Result Date: 04/05/2020 CLINICAL DATA:  Pain status post fall EXAM: RIGHT FEMUR PORTABLE 2 VIEW; RIGHT TIBIA AND FIBULA - 2 VIEW COMPARISON:  None. FINDINGS: There is no definite acute displaced fracture involving the tibia or fibula. Evaluation of nondisplaced fractures is limited by osteopenia. There is soft tissue swelling about the ankle. There is a small plantar calcaneal spur. Vascular calcifications are noted. The patient is status post prior intramedullary nail placement. The hardware appears grossly intact. There is an acute appearing impacted supracondylar fracture of the distal right femur. There is surrounding soft tissue swelling. IMPRESSION: 1. Acute appearing impacted supracondylar fracture of the distal  right femur. 2. No definite acute displaced fracture involving the tibia or fibula. Evaluation of nondisplaced fractures is limited by osteopenia. 3. Soft tissue swelling about the ankle. 4. Hardware appears grossly intact. Electronically Signed   By: Constance Holster M.D.   On: 04/05/2020 21:24    EKG: Independently reviewed.  Vent. rate 106 BPM PR interval * ms QRS duration 100 ms QT/QTc 392/521 ms P-R-T axes 51 -46 -20 Sinus tachycardia Incomplete RBBB and LAFB Low voltage, precordial leads Abnormal R-wave progression, early transition Consider anterior infarct Prolonged QT interval  Assessment/Plan Principal Problem:   Sepsis secondary to UTI POA ([place]) Admit to progressive unit/inpatient. Continue supplemental oxygen. Continue IV fluids. Continue cefepime per pharmacy. Follow blood culture and sensitivity. Follow urine culture and sensitivity.  Active Problems:   AKI (acute kidney injury) ([place]) Hold losartan. Hold furosemide. Continue IVF. Monitor intake and output. Follow-up renal function electrolytes.    Hyperkalemia Secondary to acidosis and volume depletion. Continue IV fluids. Patient due to receive Levemir evening dose. Follow-up potassium level in a.m.    Closed fracture of right distal femur [facility]) Orthopedic surgery consulted. CT of the right knee order. Will need immobilization of the area. Follow-up with orthopedics surgery in the morning.    Bilateral lower extremity edema Hypoalbuminemia, poor mobility. Unknown if dietary sodium may be playing a role. Check echocardiogram.    Grade I diastolic dysfunction Holding ARB and diuretic due to AKI. Echocardiogram in the morning.    Prolonged QT interval Magnesium sulfate 2 g IV x1 dose. Avoid QT interval prolonging meds.    Hypertension Hold losartan and furosemide. Continue metoprolol 25 mg p.o. twice daily.    Right spastic hemiparesis (HCC) Resume baclofen 10 mg p.o. 3 times  daily. Supportive care. Consult PT and OT.    Abnormal LFTs Hold atorvastatin. Follow LFTs in the a.m.    Mild protein malnutrition ([place]) Consult nutritionist. Hypoalbuminemia worsening chronic edema.    DVT prophylaxis: On Eliquis. Code Status:   Full code. Family Communication:  Discussed with the patient's daughter and son. Disposition Plan:   Patient is from:  Home.  Anticipated DC to:  Home.  Anticipated DC date:  04/08/2020.  Anticipated DC barriers: Clinical status.  Consults called:  Orthopedic surgery (Dr. Percell Miller). Admission status:  Inpatient/progressive unit.   Severity of Illness: High due to sepsis  with significant lactic acidosis, AKI, weakness complicated by fall and fracture of the right distal femur, type 2 diabetes and other comorbidities.  Reubin Milan MD [facility]  How to contact the [facility] Attending or Consulting provider [place] or covering provider during after hours [place], for this patient?   1. Check the care team in [facility] and look for a) attending/consulting [facility] provider listed and b) the [facility] team listed 2. Log into [email or web address] and use [place]'s universal password to access. If you do not have the password, please contact the hospital operator. 3. Locate the [facility] provider you are looking for under [facility] and page to a number that you can be directly reached. 4. If you still have difficulty reaching the provider, please page the [facility] (Director on Call) for the Hospitalists listed on amion for assistance.  04/05/2020, 10:31 PM   This document was prepared using Dragon voice recognition software and may contain some unintended transcription errors.

## 2020-04-05 NOTE — Progress Notes (Signed)
Pharmacy Antibiotic Note  [name] is a 74 y.o. female admitted on 04/05/2020 with UTI.  Pharmacy has been consulted for Cefepime dosing. WBC 14.5. LA 6.2. SCr 1.37.   Plan: -Start Cefepime 2 gm IV Q 12 hours  -Monitor CBC, renal fx, cultures and clinical progress  Height: 5\' 6"  (167.6 cm) Weight: 68.9 kg (151 lb 14.4 oz) IBW/kg (Calculated) : 59.3  Temp (24hrs), Avg:99.5 F (37.5 C), Min:99.5 F (37.5 C), Max:99.5 F (37.5 C)  Recent Labs  Lab 04/05/20 1820  WBC 14.5*  CREATININE 1.37*  LATICACIDVEN 6.2*    Estimated Creatinine Clearance: 33.2 mL/min (A) (by C-G formula based on SCr of 1.37 mg/dL (H)).    Allergies  Allergen Reactions  . Pork-Derived Products Other (See Comments)    To keep blood pressure down    Antimicrobials this admission: Cefepime 10/2 >>   Dose adjustments this admission:  Microbiology results: 10/2 BCx:  10/2 UCx:     Thank you for allowing pharmacy to be a part of this patient's care.  Albertina Parr, PharmD., BCPS, BCCCP Clinical Pharmacist Clinical phone for 04/05/20 until 10pm: [phone] If after 10pm, please refer to [facility] for unit-specific pharmacist

## 2020-04-05 NOTE — ED Notes (Signed)
Lab tech unable to draw Troponin.

## 2020-04-05 NOTE — ED Triage Notes (Signed)
Pt brought to ED by [facility] from home for c/o increase fatigue usually AO x 4 disoriented to time now. Pt report UTI for 9 days not getting any better. HR 114, R 36, BP 90/60 93% RA placed on 2L [place] up to 100%. Right side deficit from old stroke. 500 mL NS given by EMS pta.

## 2020-04-05 NOTE — ED Notes (Signed)
Son called for update. Update given.  Son states that pt always has leg swelling.

## 2020-04-06 ENCOUNTER — Inpatient Hospital Stay (HOSPITAL_COMMUNITY): Payer: Medicare Other

## 2020-04-06 DIAGNOSIS — A419 Sepsis, unspecified organism: Secondary | ICD-10-CM | POA: Diagnosis not present

## 2020-04-06 DIAGNOSIS — I5030 Unspecified diastolic (congestive) heart failure: Secondary | ICD-10-CM

## 2020-04-06 DIAGNOSIS — N390 Urinary tract infection, site not specified: Secondary | ICD-10-CM | POA: Diagnosis not present

## 2020-04-06 DIAGNOSIS — I361 Nonrheumatic tricuspid (valve) insufficiency: Secondary | ICD-10-CM

## 2020-04-06 LAB — APTT: aPTT: 28 seconds (ref 24–36)

## 2020-04-06 LAB — CBC WITH DIFFERENTIAL/PLATELET
Abs Immature Granulocytes: 0.05 10*3/uL (ref 0.00–0.07)
Basophils Absolute: 0.1 10*3/uL (ref 0.0–0.1)
Basophils Relative: 0 %
Eosinophils Absolute: 0.3 10*3/uL (ref 0.0–0.5)
Eosinophils Relative: 3 %
HCT: 33.4 % — ABNORMAL LOW (ref 36.0–46.0)
Hemoglobin: 10.2 g/dL — ABNORMAL LOW (ref 12.0–15.0)
Immature Granulocytes: 0 %
Lymphocytes Relative: 29 %
Lymphs Abs: 3.4 10*3/uL (ref 0.7–4.0)
MCH: 27.4 pg (ref 26.0–34.0)
MCHC: 30.5 g/dL (ref 30.0–36.0)
MCV: 89.8 fL (ref 80.0–100.0)
Monocytes Absolute: 0.8 10*3/uL (ref 0.1–1.0)
Monocytes Relative: 7 %
Neutro Abs: 7.2 10*3/uL (ref 1.7–7.7)
Neutrophils Relative %: 61 %
Platelets: 178 10*3/uL (ref 150–400)
RBC: 3.72 MIL/uL — ABNORMAL LOW (ref 3.87–5.11)
RDW: 13.1 % (ref 11.5–15.5)
WBC: 11.8 10*3/uL — ABNORMAL HIGH (ref 4.0–10.5)
nRBC: 0 % (ref 0.0–0.2)

## 2020-04-06 LAB — COMPREHENSIVE METABOLIC PANEL
ALT: 35 U/L (ref 0–44)
AST: 36 U/L (ref 15–41)
Albumin: 2.7 g/dL — ABNORMAL LOW (ref 3.5–5.0)
Alkaline Phosphatase: 60 U/L (ref 38–126)
Anion gap: 9 (ref 5–15)
BUN: 12 mg/dL (ref 8–23)
CO2: 25 mmol/L (ref 22–32)
Calcium: 8.5 mg/dL — ABNORMAL LOW (ref 8.9–10.3)
Chloride: 109 mmol/L (ref 98–111)
Creatinine, Ser: 1.15 mg/dL — ABNORMAL HIGH (ref 0.44–1.00)
GFR calc Af Amer: 54 mL/min — ABNORMAL LOW (ref >60)
GFR calc non Af Amer: 47 mL/min — ABNORMAL LOW (ref >60)
Glucose, Bld: 221 mg/dL — ABNORMAL HIGH (ref 70–99)
Potassium: 3.9 mmol/L (ref 3.5–5.1)
Sodium: 143 mmol/L (ref 135–145)
Total Bilirubin: 0.7 mg/dL (ref 0.3–1.2)
Total Protein: 5.2 g/dL — ABNORMAL LOW (ref 6.5–8.1)

## 2020-04-06 LAB — TROPONIN I (HIGH SENSITIVITY): Troponin I (High Sensitivity): 53 ng/L — ABNORMAL HIGH (ref <18)

## 2020-04-06 LAB — LACTIC ACID, PLASMA
Lactic Acid, Venous: 3.2 mmol/L (ref 0.5–1.9)
Lactic Acid, Venous: 2.8 mmol/L (ref 0.5–1.9)

## 2020-04-06 LAB — GLUCOSE, CAPILLARY
Glucose-Capillary: 125 mg/dL — ABNORMAL HIGH (ref 70–99)
Glucose-Capillary: 122 mg/dL — ABNORMAL HIGH (ref 70–99)
Glucose-Capillary: 199 mg/dL — ABNORMAL HIGH (ref 70–99)

## 2020-04-06 LAB — MAGNESIUM: Magnesium: 1.9 mg/dL (ref 1.7–2.4)

## 2020-04-06 LAB — ECHOCARDIOGRAM COMPLETE
Area-P 1/2: 5.75 cm2
Height: 66 in
S' Lateral: 2.5 cm
Single Plane A4C EF: 62.8 %
Weight: 2430.35 oz

## 2020-04-06 LAB — URINE CULTURE: Culture: NO GROWTH

## 2020-04-06 LAB — PROTIME-INR
INR: 1.2 (ref 0.8–1.2)
Prothrombin Time: 14.9 seconds (ref 11.4–15.2)

## 2020-04-06 MED ORDER — PERFLUTREN LIPID MICROSPHERE
1.0000 mL | INTRAVENOUS | Status: AC | PRN
  Administered 2020-04-06: 2 mL via INTRAVENOUS
  Filled 2020-04-06: qty 10

## 2020-04-06 MED ORDER — METOPROLOL SUCCINATE ER 25 MG PO TB24
25.0000 mg | ORAL_TABLET | Freq: Every day | ORAL | Status: DC
  Administered 2020-04-06 – 2020-04-10 (×5): 25 mg via ORAL
  Filled 2020-04-06 (×5): qty 1

## 2020-04-06 MED ORDER — LEVETIRACETAM 500 MG PO TABS
500.0000 mg | ORAL_TABLET | Freq: Two times a day (BID) | ORAL | Status: DC
  Administered 2020-04-06 – 2020-04-10 (×10): 500 mg via ORAL
  Filled 2020-04-06 (×10): qty 1

## 2020-04-06 MED ORDER — INSULIN DETEMIR 100 UNIT/ML ~~LOC~~ SOLN
25.0000 [IU] | Freq: Two times a day (BID) | SUBCUTANEOUS | Status: DC
  Administered 2020-04-06 – 2020-04-08 (×4): 25 [IU] via SUBCUTANEOUS
  Filled 2020-04-06 (×8): qty 0.25

## 2020-04-06 MED ORDER — APIXABAN 5 MG PO TABS
5.0000 mg | ORAL_TABLET | Freq: Two times a day (BID) | ORAL | Status: DC
  Administered 2020-04-06 – 2020-04-10 (×10): 5 mg via ORAL
  Filled 2020-04-06 (×10): qty 1

## 2020-04-06 MED ORDER — INSULIN DETEMIR 100 UNIT/ML ~~LOC~~ SOLN
25.0000 [IU] | Freq: Two times a day (BID) | SUBCUTANEOUS | Status: DC
  Administered 2020-04-06: 25 [IU] via SUBCUTANEOUS
  Filled 2020-04-06 (×3): qty 0.25

## 2020-04-06 NOTE — ED Notes (Signed)
Ortho tech contacted, will come to unit for knee immobilizer placement

## 2020-04-06 NOTE — Consult Note (Signed)
ORTHOPAEDIC CONSULTATION  REQUESTING PHYSICIAN: Bonnell Public, MD  Chief Complaint: right knee pain  HPI: [name] is a 74 y.o. 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 Orthopedics was consulted for right distal femur fracture.  Upon exam, patient resting comfortably in hospital bed. Says she hurts in her right leg. Prior to injury, she was non-weight bearing. She only transferred with assistance and could not do so on her own. She has sensation of the right side but extremely limited mobility. Denies pain to any other extremity. Dr. Percell Miller did an IM nail for right femur fracture back in 2016.   Past Medical History:  Diagnosis Date  . Acute blood loss anemia   . CHF (congestive heart failure) (HCC)    EF 92-44%, grade 1 diastolic dysfunction per echo 04/2015  . 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 CVA (cerebral infarction) 04/2015   Started Plavix 04/2015  . Depression   . Diabetes ([place]) 2016   Type II. On insulin  . Enchondroma of bone 2011   left femur.   . Fracture, intertrochanteric, right femur ([place])   . History of lower GI bleeding   . Hyperlipidemia   . Hypertension   . Patent foramen ovale 04/2015   Started on warfarin 04/2015  . Protein calorie malnutrition ([place])   . Stercoral ulcer of rectum 05/16/2015   Past Surgical History:  Procedure Laterality Date  . CARDIAC SURGERY     Cath without stent   . COLONOSCOPY N/A 05/15/2015   Procedure: COLONOSCOPY;  Surgeon: Mauri Pole, MD;  Location: [facility] ENDOSCOPY;  Service: Endoscopy;  Laterality: N/A;  . FLEXIBLE SIGMOIDOSCOPY N/A 05/15/2015   Procedure: FLEXIBLE SIGMOIDOSCOPY;  Surgeon: Mauri Pole, MD;  Location: [place] ENDOSCOPY;  Service: Endoscopy;  Laterality: N/A;  at bedside  . INTRAMEDULLARY (IM) NAIL INTERTROCHANTERIC Right 06/12/2015   Procedure: INTRAMEDULLARY (IM) NAIL RIGHT HIP;  Surgeon: Renette Butters, MD;  Location: [place];  Service: Orthopedics;  Laterality: Right;  . TEE WITHOUT CARDIOVERSION N/A 05/05/2015   Procedure: TRANSESOPHAGEAL ECHOCARDIOGRAM (TEE);  Surgeon: Dixie Dials, MD;  Location: [facility] ENDOSCOPY;  Service: Cardiovascular;  Laterality: N/A;   Social History   Socioeconomic History  . Marital status: Married    Spouse name: Not on file  . Number of children: 6  . Years of education: 69  . Highest education level: Not on file  Occupational History  . Occupation: Retired  Tobacco Use  . Smoking status: Former Smoker    Types: Cigarettes    Quit date: 04/05/2015    Years since quitting: 5.0  . Smokeless tobacco: Never Used  . Tobacco comment: Quit 04/28/15  Vaping Use  . Vaping Use: Never used  Substance and Sexual Activity  . Alcohol use: No    Alcohol/week: 0.0 standard drinks  . Drug use: No  . Sexual activity: Not on file  Other Topics Concern  . Not on file  Social History Narrative   She will be living with  her two sons.\   Right-handed.   No caffeine use.   Social Determinants of Health   Financial Resource Strain:   . Difficulty of Paying Living Expenses: Not on file  Food Insecurity:   . Worried About Charity fundraiser in the Last Year: Not on file  . Ran Out of Food in the Last Year: Not on file  Transportation Needs:   . Lack of Transportation (Medical): Not on file  . Lack of Transportation (Non-Medical): Not on file  Physical Activity:   . Days of Exercise per Week:  Not on file  . Minutes of Exercise per Session: Not on file  Stress:   . Feeling of Stress : Not on file  Social Connections:   . Frequency of Communication with Friends and Family: Not on file  . Frequency of Social Gatherings with Friends and Family: Not on file  . Attends Religious Services: Not on file  . Active Member of Clubs or Organizations: Not on file  . Attends Archivist Meetings: Not on file  . Marital Status: Not on file   Family History  Problem Relation Age of Onset  . Hypertension Mother   . Heart failure Mother   . Hyperlipidemia Mother   . Heart attack Father    Allergies  Allergen Reactions  . Pork-Derived Products Other (See Comments)    To keep blood pressure down   Prior to Admission medications   Medication Sig Start Date End Date Taking? Authorizing Provider  atorvastatin (LIPITOR) 40 MG tablet Take 1 tablet (40 mg total) by mouth daily. Patient taking differently: Take 20 mg by mouth daily.  05/06/15  Yes Charolette Forward, MD  ELIQUIS 5 MG TABS tablet Take 5 mg by mouth 2 (two) times daily.  12/30/19  Yes [provider]  insulin detemir (LEVEMIR) 100 UNIT/ML injection INJECT 50 UNITS SUBCUTANEOUSLY ONCE DAILY Patient taking differently: Inject 25 Units into the skin 2 (two) times daily. Take With Meals 08/29/19  Yes Biagio Borg, MD  levETIRAcetam (KEPPRA) 500 MG tablet Take 1 tablet (500 mg total) by mouth 2 (two) times daily. 03/17/20  Yes Marcial Pacas, MD  losartan (COZAAR) 25 MG tablet Take 12.5 mg by mouth daily.  12/30/19  Yes [provider]  metoprolol succinate (TOPROL-XL) 25 MG 24 hr tablet Take 25 mg by mouth daily.  12/30/19  Yes [provider]  baclofen (LIORESAL) 10 MG tablet Take 1 tablet (10 mg total) by mouth 3 (three) times daily. Patient not taking: Reported on 04/05/2020 02/06/19   Marcial Pacas, MD  furosemide (LASIX) 20 MG tablet Take 20 mg by mouth daily. 03/31/20   [provider]  gabapentin  (NEURONTIN) 300 MG capsule Take 1 capsule (300 mg total) by mouth 3 (three) times daily. Patient not taking: Reported on 04/05/2020 02/06/19   Marcial Pacas, MD   DG Tibia/Fibula Right  Result Date: 04/05/2020 CLINICAL DATA:  Pain status post fall EXAM: RIGHT FEMUR PORTABLE 2 VIEW; RIGHT TIBIA AND FIBULA - 2 VIEW COMPARISON:  None. FINDINGS: There is no definite acute displaced fracture involving the tibia or fibula. Evaluation of nondisplaced fractures is limited by osteopenia. There is soft tissue swelling about the ankle. There is a small plantar calcaneal spur. Vascular calcifications are noted. The patient is status post prior intramedullary nail placement. The hardware appears grossly intact. There is an acute appearing impacted supracondylar fracture of the distal right femur. There is surrounding soft tissue swelling. IMPRESSION: 1. Acute  appearing impacted supracondylar fracture of the distal right femur. 2. No definite acute displaced fracture involving the tibia or fibula. Evaluation of nondisplaced fractures is limited by osteopenia. 3. Soft tissue swelling about the ankle. 4. Hardware appears grossly intact. Electronically Signed   By: Constance Holster M.D.   On: 04/05/2020 21:24   CT Knee Right Wo Contrast  Result Date: 04/05/2020 CLINICAL DATA:  Pain, fracture the EXAM: CT OF THE right KNEE WITHOUT CONTRAST TECHNIQUE: Multidetector CT imaging of the right knee was performed according to the standard protocol. Multiplanar CT image reconstructions were also generated. COMPARISON:  Radiograph same day FINDINGS: Bones/Joint/Cartilage The patient has had a prior IM nail fixation of the femur. There is a comminuted impacted distal femur metadiaphyseal fracture with extension through the medial and lateral condyles. There is also extension seen through the lateral intracondylar notch. A small fracture fragment is seen anteriorly. There is a nondisplaced posterior fibular head fracture. There is diffuse  osteopenia. A moderate lipohemarthrosis is present. Ligaments Suboptimally assessed by CT. Muscles and Tendons Mild fatty atrophy seen within the muscles surrounding the knee. The patellar and quadriceps tendon are intact. Soft tissues There is diffuse subcutaneous edema seen along the lateral aspect of the knee. Scattered dense vascular calcifications are noted. IMPRESSION: Comminuted impacted fracture of the distal femoral metadiaphysis with extension through the lateral intracondylar notch. Moderate lipohemarthrosis. Probable nondisplaced posterior fibular head fracture. Electronically Signed   By: Prudencio Pair M.D.   On: 04/05/2020 23:24   DG Chest Port 1 View  Result Date: 04/05/2020 CLINICAL DATA:  Shortness of breath. EXAM: PORTABLE CHEST 1 VIEW COMPARISON:  June 10, 2015 FINDINGS: Decreased lung volumes are seen which is likely secondary to the degree of patient inspiration. Very mild atelectasis is seen within the left lung base. There is no evidence of a pleural effusion or pneumothorax. The cardiac silhouette is mildly enlarged and unchanged in size. There is marked severity calcification of the thoracic aorta. The visualized skeletal structures are unremarkable. IMPRESSION: Very mild left basilar atelectasis. Electronically Signed   By: Virgina Norfolk M.D.   On: 04/05/2020 18:52   ECHOCARDIOGRAM COMPLETE  Result Date: 04/06/2020    ECHOCARDIOGRAM REPORT   Patient Name:   IYARI HAGNER Date of Exam: 04/06/2020 Medical Rec #:  [id number]       Height:       66.0 in Accession #:    [id number]      Weight:       151.9 lb Date of Birth:  Jul 04, 1946        BSA:          1.779 m Patient Age:    48 years        BP:           111/68 mmHg Patient Gender: F               HR:           73 bpm. Exam Location:  Inpatient Procedure: 2D Echo, Cardiac Doppler, Color Doppler and Intracardiac            Opacification Agent Indications:    Congestive Heart Failure 428.0 / I50.9  History:        Patient has  prior history of Echocardiogram examinations, most                 recent 05/05/2015. CHF, CAD, Stroke; Risk Factors:Hypertension,  Dyslipidemia and Former Smoker.  Sonographer:    Vickie Epley RDCS Referring Phys: [id number] [place]  Sonographer Comments: No apical window and no subcostal window. IMPRESSIONS  1. Left ventricular ejection fraction, by estimation, is 55 to 60%. The left ventricle has normal function. The left ventricle has no regional wall motion abnormalities. Left ventricular diastolic parameters are consistent with Grade I diastolic dysfunction (impaired relaxation).  2. Limited visualization of RV. Best seen in the contrast subcostal images with evidence of moderate RV enlargement, probably mild to moderate dysfunction. . Right ventricular systolic function mild to moderately decreased. The right ventricular size is  severely enlarged. There is moderately elevated pulmonary artery systolic pressure.  3. The mitral valve is normal in structure. No evidence of mitral valve regurgitation. No evidence of mitral stenosis.  4. Tricuspid valve regurgitation is moderate.  5. The aortic valve is tricuspid. Aortic valve regurgitation is not visualized. No aortic stenosis is present.  6. IVC is poorly visualized limiting PASP assessment. At least moderate pulmonary HTN (PASP is at least 53 mmHg). FINDINGS  Left Ventricle: Left ventricular ejection fraction, by estimation, is 55 to 60%. The left ventricle has normal function. The left ventricle has no regional wall motion abnormalities. Definity contrast agent was given IV to delineate the left ventricular  endocardial borders. The left ventricular internal cavity size was normal in size. There is no left ventricular hypertrophy. Left ventricular diastolic parameters are consistent with Grade I diastolic dysfunction (impaired relaxation). Normal left ventricular filling pressure. Right Ventricle: Limited visualization of RV. Best seen  in the contrast subcostal images with evidence of moderate RV enlargement, probably mild to moderate dysfunction. The right ventricular size is severely enlarged. Right vetricular wall thickness was  not well visualized. Right ventricular systolic function mild to moderately decreased. There is moderately elevated pulmonary artery systolic pressure. The tricuspid regurgitant velocity is 3.53 m/s, and with an assumed right atrial pressure of 3 mmHg, the estimated right ventricular systolic pressure is 08.6 mmHg. Left Atrium: Left atrial size was normal in size. Right Atrium: Right atrial size was normal in size. Pericardium: There is no evidence of pericardial effusion. Mitral Valve: The mitral valve is normal in structure. No evidence of mitral valve regurgitation. No evidence of mitral valve stenosis. Tricuspid Valve: The tricuspid valve is not well visualized. Tricuspid valve regurgitation is moderate . No evidence of tricuspid stenosis. Aortic Valve: The aortic valve is tricuspid. Aortic valve regurgitation is not visualized. No aortic stenosis is present. Pulmonic Valve: The pulmonic valve was not well visualized. Pulmonic valve regurgitation is not visualized. No evidence of pulmonic stenosis. Aorta: The aortic root is normal in size and structure. Pulmonary Artery: IVC is poorly visualized limiting PASP assessment. At least moderate pulmonary HTN (PASP is at least 53 mmHg). Venous: The inferior vena cava was not well visualized. IAS/Shunts: The interatrial septum was not well visualized.  LEFT VENTRICLE PLAX 2D LVIDd:         3.30 cm     Diastology LVIDs:         2.50 cm     LV e' medial:    5.87 cm/s LV PW:         0.90 cm     LV E/e' medial:  8.2 LV IVS:        0.90 cm     LV e' lateral:   9.25 cm/s LVOT diam:     2.10 cm     LV E/e' lateral: 5.2 LVOT Area:  3.46 cm  LV Volumes (MOD) LV vol d, MOD A4C: 51.9 ml LV vol s, MOD A4C: 19.3 ml LV SV MOD A4C:     51.9 ml RIGHT VENTRICLE RV S prime:     12.40  cm/s TAPSE (M-mode): 1.6 cm LEFT ATRIUM           Index       RIGHT ATRIUM           Index LA diam:      3.60 cm 2.02 cm/m  RA Area:     11.40 cm LA Vol (A2C): 15.6 ml 8.77 ml/m  RA Volume:   25.90 ml  14.56 ml/m LA Vol (A4C): 29.7 ml 16.69 ml/m   AORTA Ao Root diam: 3.30 cm MITRAL VALVE               TRICUSPID VALVE MV Area (PHT): 5.75 cm    TR Peak grad:   49.8 mmHg MV Decel Time: 132 msec    TR Vmax:        353.00 cm/s MV E velocity: 48.00 cm/s MV A velocity: 80.60 cm/s  SHUNTS MV E/A ratio:  0.60        Systemic Diam: 2.10 cm Carlyle Dolly MD Electronically signed by Carlyle Dolly MD Signature Date/Time: 04/06/2020/10:34:56 AM    Final    DG Femur Portable Min 2 Views Right  Result Date: 04/05/2020 CLINICAL DATA:  Pain status post fall EXAM: RIGHT FEMUR PORTABLE 2 VIEW; RIGHT TIBIA AND FIBULA - 2 VIEW COMPARISON:  None. FINDINGS: There is no definite acute displaced fracture involving the tibia or fibula. Evaluation of nondisplaced fractures is limited by osteopenia. There is soft tissue swelling about the ankle. There is a small plantar calcaneal spur. Vascular calcifications are noted. The patient is status post prior intramedullary nail placement. The hardware appears grossly intact. There is an acute appearing impacted supracondylar fracture of the distal right femur. There is surrounding soft tissue swelling. IMPRESSION: 1. Acute appearing impacted supracondylar fracture of the distal right femur. 2. No definite acute displaced fracture involving the tibia or fibula. Evaluation of nondisplaced fractures is limited by osteopenia. 3. Soft tissue swelling about the ankle. 4. Hardware appears grossly intact. Electronically Signed   By: Constance Holster M.D.   On: 04/05/2020 21:24   Family History Reviewed and non-contributory, no pertinent history of problems with bleeding or anesthesia      Review of Systems 14 system ROS conducted and negative except for that noted in  HPI   OBJECTIVE  Vitals: Patient Vitals for the past 8 hrs:  BP Pulse Resp SpO2  04/06/20 1027 119/69 100 -- --  04/06/20 0615 -- 98 16 96 %  04/06/20 0600 111/68 94 16 98 %  04/06/20 0545 -- 92 13 98 %  04/06/20 0530 -- 92 14 98 %  04/06/20 0515 -- 93 15 99 %  04/06/20 0500 113/75 93 14 100 %  04/06/20 0400 110/73 93 14 95 %  04/06/20 0345 -- -- 13 --  04/06/20 0330 -- 93 14 96 %  04/06/20 0315 -- 94 14 97 %  04/06/20 0300 112/73 91 15 97 %   General: Alert, no acute distress Cardiovascular: Warm extremities noted Respiratory: No cyanosis, no use of accessory musculature GI: No organomegaly, abdomen is soft and non-tender Skin: No lesions in the area of chief complaint other than those listed below in MSK exam.  Neurologic: Sensation intact distally save for the below mentioned MSK exam Psychiatric: Patient  is competent for consent with normal mood and affect Lymphatic: No swelling obvious and reported other than the area involved in the exam below  Extremities  RUE: right-sided spastic hemiparesis. Non-tender to palpation throughout. Warm well perfused hand LUE: Actively moves LUE without pain. Non-tender to palpation throughout. NVI.  RLE: right-sided spastic hemiparesis. Knee immobilizer in place. Reports tenderness right hip/thigh/knee/ankle. Compartments soft and compressible. Well perfused foot LLE: actively moves LLE without pain. Non-tender to palpation throughout. Well perfused foot.     Test Results Imaging Xrays of right femur and tib/fib show impacted distal femur fracture. Osteopenia. Intramedullary nail in place from previous surgery without any complications.   CT right knee showing impacted fracture of distal femur without significant displacement  Labs cbc Recent Labs    04/05/20 1820 04/06/20 0416  WBC 14.5* 11.8*  HGB 12.5 10.2*  HCT 41.4 33.4*  PLT 200 178    Labs inflam No results for input(s): CRP in the last 72 hours.  Invalid input(s):  ESR  Labs coag Recent Labs    04/06/20 0416  INR 1.2    Recent Labs    04/05/20 1820 04/06/20 0416  NA 138 143  K 5.3* 3.9  CL 107 109  CO2 18* 25  GLUCOSE 308* 221*  BUN 13 12  CREATININE 1.37* 1.15*  CALCIUM 8.4* 8.5*     ASSESSMENT AND PLAN: 74 y.o. female with the following: right distal femur fracture  - Plan: non-operative management at this time. - Recommend: Knee immobilizer at all times. She may remove for bathing.  - Weight Bearing Status/Activity: patient is already non-weight bearing on that side due to previous stroke.  - Additional recommended labs/tests: None - Follow-up plan: 2 weeks in office with Dr. Percell Miller  Orthopedics will be available as needed for any further questions/concerns.     Noemi Chapel, PA-C 04/06/2020

## 2020-04-06 NOTE — ED Notes (Signed)
[name], son, [phone] would like an update when available

## 2020-04-06 NOTE — Progress Notes (Signed)
  Echocardiogram 2D Echocardiogram has been performed.  [name] 04/06/2020, 10:25 AM

## 2020-04-06 NOTE — ED Notes (Signed)
Patient resting peacefully on stretcher with HOB elevated. Denies needs. Vitals stable. NAD noted.

## 2020-04-06 NOTE — Progress Notes (Signed)
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  Patient was admitted following a fall and closed right distal femoral fracture.  Other findings on admission includes UTI, AKI, hyperkalemia, bilateral lower extremity edema, grade 1 diastolic dysfunction, hypertension, prolonged QT interval and abnormal liver function test.  There is documentation of sepsis in the H&P.  Orthopedic input is appreciated, conservative management is advised.  Patient is currently on IV cefepime, while cultures are pending.  Acute kidney injury is resolving.  Serum creatinine is down to 1.15.  Potassium is down to 3.9.  Patient was seen alongside patient's son.   Assessment & Plan:   Principal Problem:   Sepsis secondary to UTI [facility]) Active Problems:   Hypertension   Right spastic hemiparesis (HCC)   Bilateral lower extremity edema   AKI (acute kidney injury) (HCC)   Hyperkalemia   Abnormal LFTs   Mild protein malnutrition (HCC)   Grade I diastolic dysfunction   Closed fracture of right distal femur (HCC)   Prolonged QT interval   Sepsis secondary to UTI POA ([place]) Admit to progressive unit/inpatient. Continue supplemental oxygen. Continue IV fluids. Continue cefepime per pharmacy. Follow blood culture and sensitivity. Follow urine culture and sensitivity.  Active Problems:  AKI (acute kidney injury) ([place]) Hold losartan. Hold furosemide. Continue IVF. Monitor intake and output. Follow-up renal function electrolytes. 04/06/2020: AKI  is improving.  Hyperkalemia Continue IV fluids. Potassium is down to 3.9.   Closed fracture of right distal femur [facility]) Orthopedic surgery input is appreciated, no surgery is planned.   Continue immobilization.   Follow-up with surgery in 2 weeks.   Await PT OT input to determine disposition.    Grade I diastolic dysfunction Holding ARB and diuretic due to AKI. Echocardiogram EF, grade 1 diastolic dysfunction and right ventricular strain.    Prolonged QT interval Repeat EKG. Check magnesium levels.  Hypertension Hold losartan and furosemide. Continue metoprolol 25 mg p.o. twice daily. 04/06/2020: Controlled.  Right spastic hemiparesis (HCC) Resume baclofen 10 mg p.o. 3 times daily. Supportive care. Consult PT and OT. 04/06/2020: We will be careful baclofen in setting of renal impairment.  However, AKI is improving.  Abnormal LFTs Suspect NASH. Follow LFTs and lipid profile.   DVT prophylaxis: Eliquis Code Status: Full code Family Communication: Son Disposition Plan: This will depend on hospital course.   Consultants:   Orthopedics  Procedures:   None  Antimicrobials:   IV cefepime   Subjective: Right knee pain  Objective: Vitals:   04/06/20 1248 04/06/20 1300 04/06/20 1400 04/06/20 1500  BP: 114/85   115/72  Pulse: (!) 116 100 97 96  Resp: (!) 22 (!) 23 19 16   Temp: 99.1 F (37.3 C)   98.4 F (36.9 C)  TempSrc: Oral   Oral  SpO2: 96% 93% 96% 94%  Weight:      Height:        Intake/Output Summary (Last 24 hours) at 04/06/2020 1728 Last data filed at 04/06/2020 1630 Gross per 24 hour  Intake 1748.07 ml  Output 240 ml  Net 1508.07 ml   [employer]  04/05/20 1728  Weight: 68.9 kg    Examination:  General exam: Appears calm and comfortable  Respiratory system: Clear to auscultation.  Cardiovascular system: S1 & S2 Gastrointestinal system: Abdomen is obese, soft and nontender.  Organs are difficult to assess.   Central  nervous system: Alert and oriented. No focal neurological deficits. Extremities: Bilateral lower leg edema.  Splinting of the right femur/knee area.  Data Reviewed: I have personally reviewed following labs and imaging studies  CBC: Recent Labs  Lab 04/05/20 1820 04/06/20 0416  WBC 14.5* 11.8*  NEUTROABS 12.7* 7.2  HGB 12.5 10.2*  HCT 41.4 33.4*  MCV 91.2 89.8  PLT 200 656   Basic Metabolic Panel: Recent Labs  Lab 04/05/20 1820 04/06/20 0416  NA 138 143  K 5.3* 3.9  CL 107 109  CO2 18* 25  GLUCOSE 308* 221*  BUN 13 12  CREATININE 1.37* 1.15*  CALCIUM 8.4* 8.5*   GFR: Estimated Creatinine Clearance: 39.6 mL/min (A) (by C-G formula based on SCr of 1.15 mg/dL (H)). Liver Function Tests: Recent Labs  Lab 04/05/20 1820 04/06/20 0416  AST 68* 36  ALT 45* 35  ALKPHOS 74 60  BILITOT 1.6* 0.7  PROT 5.9* 5.2*  ALBUMIN 3.2* 2.7*   No results for input(s): LIPASE, AMYLASE in the last 168 hours. Recent Labs  Lab 04/05/20 1820  AMMONIA 25   Coagulation Profile: Recent Labs  Lab 04/06/20 0416  INR 1.2   Cardiac Enzymes: Recent Labs  Lab 04/05/20 1820  CKTOTAL 233   BNP (last 3 results) No results for input(s): PROBNP in the last 8760 hours. HbA1C: No results for input(s): HGBA1C in the last 72 hours. CBG: Recent Labs  Lab 04/05/20 2201 04/06/20 1302 04/06/20 1626  GLUCAP 195* 125* 122*   Lipid Profile: No results for input(s): CHOL, HDL, LDLCALC, TRIG, CHOLHDL, LDLDIRECT in the last 72 hours. Thyroid Function Tests: No results for input(s): TSH, T4TOTAL, FREET4, T3FREE, THYROIDAB in the last 72 hours. Anemia Panel: No results for input(s): VITAMINB12, FOLATE, FERRITIN, TIBC, IRON, RETICCTPCT in the last 72 hours. Urine analysis:    Component Value Date/Time   COLORURINE YELLOW 04/05/2020 1747   APPEARANCEUR HAZY (A) 04/05/2020 1747   LABSPEC 1.012 04/05/2020 1747   PHURINE 5.0 04/05/2020 1747   GLUCOSEU 50 (A) 04/05/2020 1747   GLUCOSEU >1000  mg/dL (A) 03/21/2020 1013   HGBUR LARGE (A) 04/05/2020 1747   BILIRUBINUR NEGATIVE 04/05/2020 1747   KETONESUR NEGATIVE 04/05/2020 1747   PROTEINUR NEGATIVE 04/05/2020 1747   UROBILINOGEN 0.2 03/21/2020 1013   NITRITE NEGATIVE 04/05/2020 1747   LEUKOCYTESUR LARGE (A) 04/05/2020 1747   Sepsis Labs: @LABRCNTIP (procalcitonin:4,lacticidven:4)  ) Recent Results (from the past 240 hour(s))  Urine Culture     Status: None   Collection Time: 04/05/20  5:36 PM   Specimen: Urine, Random  Result Value Ref Range Status   Specimen Description URINE, RANDOM  Final   Special Requests NONE  Final   Culture   Final    NO GROWTH Performed at [facility], [place] [address], [place], [place] [address]    Report Status 04/06/2020 FINAL  Final  Culture, blood (Routine X 2) w Reflex to ID Panel     Status: None (Preliminary result)   Collection Time: 04/05/20  6:20 PM   Specimen: BLOOD  Result Value Ref Range Status   Specimen Description BLOOD LEFT ANTECUBITAL  Final   Special Requests   Final    BOTTLES DRAWN AEROBIC AND  ANAEROBIC Blood Culture results may not be optimal due to an inadequate volume of blood received in culture bottles   Culture   Final    NO GROWTH < 24 HOURS Performed at [facility] [address], [place], [place] [address]    Report Status PENDING  Incomplete  Culture, blood (Routine X 2) w Reflex to ID Panel     Status: None (Preliminary result)   Collection Time: 04/05/20  6:47 PM   Specimen: BLOOD LEFT HAND  Result Value Ref Range Status   Specimen Description BLOOD LEFT HAND  Final   Special Requests   Final    BOTTLES DRAWN AEROBIC AND ANAEROBIC Blood Culture results may not be optimal due to an inadequate volume of blood received in culture bottles   Culture   Final    NO GROWTH < 24 HOURS Performed at [facility], [place] [address] [place], [place] [address]    Report Status PENDING  Incomplete  Respiratory Panel by RT PCR (Flu A&B, Covid) -  Nasopharyngeal Swab     Status: None   Collection Time: 04/05/20  6:49 PM   Specimen: Nasopharyngeal Swab  Result Value Ref Range Status   SARS Coronavirus 2 by RT PCR NEGATIVE NEGATIVE Final    Comment: (NOTE) SARS-CoV-2 target nucleic acids are NOT DETECTED.  The SARS-CoV-2 RNA is generally detectable in upper respiratoy specimens during the acute phase of infection. The lowest concentration of SARS-CoV-2 viral copies this assay can detect is 131 copies/mL. A negative result does not preclude SARS-Cov-2 infection and should not be used as the sole basis for treatment or other patient management decisions. A negative result may occur with  improper specimen collection/handling, submission of specimen other than nasopharyngeal swab, presence of viral mutation(s) within the areas targeted by this assay, and inadequate number of viral copies (<131 copies/mL). A negative result must be combined with clinical observations, patient history, and epidemiological information. The expected result is Negative.  Fact Sheet for Patients:  PinkCheek.be  Fact Sheet for Healthcare Providers:  GravelBags.it  This test is no t yet approved or cleared by the [place] FDA and  has been authorized for detection and/or diagnosis of SARS-CoV-2 by FDA under an Emergency Use Authorization (EUA). This EUA will remain  in effect (meaning this test can be used) for the duration of the COVID-19 declaration under Section 564(b)(1) of the Act, 21 U.S.C. section 360bbb-3(b)(1), unless the authorization is terminated or revoked sooner.     Influenza A by PCR NEGATIVE NEGATIVE Final   Influenza B by PCR NEGATIVE NEGATIVE Final    Comment: (NOTE) The Xpert Xpress SARS-CoV-2/FLU/RSV assay is intended as an aid in  the diagnosis of influenza from Nasopharyngeal swab specimens and  should not be used as a sole basis for treatment. Nasal washings and    aspirates are unacceptable for Xpert Xpress SARS-CoV-2/FLU/RSV  testing.  Fact Sheet for Patients: PinkCheek.be  Fact Sheet for Healthcare Providers: GravelBags.it  This test is not yet approved or cleared by the [place] FDA and  has been authorized for detection and/or diagnosis of SARS-CoV-2 by  FDA under an Emergency Use Authorization (EUA). This EUA will remain  in effect (meaning this test can be used) for the duration of the  Covid-19 declaration under Section 564(b)(1) of the Act, 21  U.S.C. section 360bbb-3(b)(1), unless the authorization is  terminated or revoked. Performed at [facility], [place] [address], [place], [place] [address]  Radiology Studies: DG Tibia/Fibula Right  Result Date: 04/05/2020 CLINICAL DATA:  Pain status post fall EXAM: RIGHT FEMUR PORTABLE 2 VIEW; RIGHT TIBIA AND FIBULA - 2 VIEW COMPARISON:  None. FINDINGS: There is no definite acute displaced fracture involving the tibia or fibula. Evaluation of nondisplaced fractures is limited by osteopenia. There is soft tissue swelling about the ankle. There is a small plantar calcaneal spur. Vascular calcifications are noted. The patient is status post prior intramedullary nail placement. The hardware appears grossly intact. There is an acute appearing impacted supracondylar fracture of the distal right femur. There is surrounding soft tissue swelling. IMPRESSION: 1. Acute appearing impacted supracondylar fracture of the distal right femur. 2. No definite acute displaced fracture involving the tibia or fibula. Evaluation of nondisplaced fractures is limited by osteopenia. 3. Soft tissue swelling about the ankle. 4. Hardware appears grossly intact. Electronically Signed   By: Constance Holster M.D.   On: 04/05/2020 21:24   CT Knee Right Wo Contrast  Result Date: 04/05/2020 CLINICAL DATA:  Pain, fracture the EXAM: CT OF THE right KNEE  WITHOUT CONTRAST TECHNIQUE: Multidetector CT imaging of the right knee was performed according to the standard protocol. Multiplanar CT image reconstructions were also generated. COMPARISON:  Radiograph same day FINDINGS: Bones/Joint/Cartilage The patient has had a prior IM nail fixation of the femur. There is a comminuted impacted distal femur metadiaphyseal fracture with extension through the medial and lateral condyles. There is also extension seen through the lateral intracondylar notch. A small fracture fragment is seen anteriorly. There is a nondisplaced posterior fibular head fracture. There is diffuse osteopenia. A moderate lipohemarthrosis is present. Ligaments Suboptimally assessed by CT. Muscles and Tendons Mild fatty atrophy seen within the muscles surrounding the knee. The patellar and quadriceps tendon are intact. Soft tissues There is diffuse subcutaneous edema seen along the lateral aspect of the knee. Scattered dense vascular calcifications are noted. IMPRESSION: Comminuted impacted fracture of the distal femoral metadiaphysis with extension through the lateral intracondylar notch. Moderate lipohemarthrosis. Probable nondisplaced posterior fibular head fracture. Electronically Signed   By: Prudencio Pair M.D.   On: 04/05/2020 23:24   DG Chest Port 1 View  Result Date: 04/05/2020 CLINICAL DATA:  Shortness of breath. EXAM: PORTABLE CHEST 1 VIEW COMPARISON:  June 10, 2015 FINDINGS: Decreased lung volumes are seen which is likely secondary to the degree of patient inspiration. Very mild atelectasis is seen within the left lung base. There is no evidence of a pleural effusion or pneumothorax. The cardiac silhouette is mildly enlarged and unchanged in size. There is marked severity calcification of the thoracic aorta. The visualized skeletal structures are unremarkable. IMPRESSION: Very mild left basilar atelectasis. Electronically Signed   By: Virgina Norfolk M.D.   On: 04/05/2020 18:52    ECHOCARDIOGRAM COMPLETE  Result Date: 04/06/2020    ECHOCARDIOGRAM REPORT   Patient Name:   [name] Date of Exam: 04/06/2020 Medical Rec #:  [id number]       Height:       66.0 in Accession #:    [id number]      Weight:       151.9 lb Date of Birth:  02-08-1946        BSA:          1.779 m Patient Age:    31 years        BP:           111/68 mmHg Patient Gender: F  HR:           73 bpm. Exam Location:  Inpatient Procedure: 2D Echo, Cardiac Doppler, Color Doppler and Intracardiac            Opacification Agent Indications:    Congestive Heart Failure 428.0 / I50.9  History:        Patient has prior history of Echocardiogram examinations, most                 recent 05/05/2015. CHF, CAD, Stroke; Risk Factors:Hypertension,                 Dyslipidemia and Former Smoker.  Sonographer:    Vickie Epley RDCS Referring Phys: [id number] [place]  Sonographer Comments: No apical window and no subcostal window. IMPRESSIONS  1. Left ventricular ejection fraction, by estimation, is 55 to 60%. The left ventricle has normal function. The left ventricle has no regional wall motion abnormalities. Left ventricular diastolic parameters are consistent with Grade I diastolic dysfunction (impaired relaxation).  2. Limited visualization of RV. Best seen in the contrast subcostal images with evidence of moderate RV enlargement, probably mild to moderate dysfunction. . Right ventricular systolic function mild to moderately decreased. The right ventricular size is  severely enlarged. There is moderately elevated pulmonary artery systolic pressure.  3. The mitral valve is normal in structure. No evidence of mitral valve regurgitation. No evidence of mitral stenosis.  4. Tricuspid valve regurgitation is moderate.  5. The aortic valve is tricuspid. Aortic valve regurgitation is not visualized. No aortic stenosis is present.  6. IVC is poorly visualized limiting PASP assessment. At least moderate pulmonary HTN (PASP  is at least 53 mmHg). FINDINGS  Left Ventricle: Left ventricular ejection fraction, by estimation, is 55 to 60%. The left ventricle has normal function. The left ventricle has no regional wall motion abnormalities. Definity contrast agent was given IV to delineate the left ventricular  endocardial borders. The left ventricular internal cavity size was normal in size. There is no left ventricular hypertrophy. Left ventricular diastolic parameters are consistent with Grade I diastolic dysfunction (impaired relaxation). Normal left ventricular filling pressure. Right Ventricle: Limited visualization of RV. Best seen in the contrast subcostal images with evidence of moderate RV enlargement, probably mild to moderate dysfunction. The right ventricular size is severely enlarged. Right vetricular wall thickness was  not well visualized. Right ventricular systolic function mild to moderately decreased. There is moderately elevated pulmonary artery systolic pressure. The tricuspid regurgitant velocity is 3.53 m/s, and with an assumed right atrial pressure of 3 mmHg, the estimated right ventricular systolic pressure is 97.3 mmHg. Left Atrium: Left atrial size was normal in size. Right Atrium: Right atrial size was normal in size. Pericardium: There is no evidence of pericardial effusion. Mitral Valve: The mitral valve is normal in structure. No evidence of mitral valve regurgitation. No evidence of mitral valve stenosis. Tricuspid Valve: The tricuspid valve is not well visualized. Tricuspid valve regurgitation is moderate . No evidence of tricuspid stenosis. Aortic Valve: The aortic valve is tricuspid. Aortic valve regurgitation is not visualized. No aortic stenosis is present. Pulmonic Valve: The pulmonic valve was not well visualized. Pulmonic valve regurgitation is not visualized. No evidence of pulmonic stenosis. Aorta: The aortic root is normal in size and structure. Pulmonary Artery: IVC is poorly visualized limiting  PASP assessment. At least moderate pulmonary HTN (PASP is at least 53 mmHg). Venous: The inferior vena cava was not well visualized. IAS/Shunts: The interatrial septum was not well  visualized.  LEFT VENTRICLE PLAX 2D LVIDd:         3.30 cm     Diastology LVIDs:         2.50 cm     LV e' medial:    5.87 cm/s LV PW:         0.90 cm     LV E/e' medial:  8.2 LV IVS:        0.90 cm     LV e' lateral:   9.25 cm/s LVOT diam:     2.10 cm     LV E/e' lateral: 5.2 LVOT Area:     3.46 cm  LV Volumes (MOD) LV vol d, MOD A4C: 51.9 ml LV vol s, MOD A4C: 19.3 ml LV SV MOD A4C:     51.9 ml RIGHT VENTRICLE RV S prime:     12.40 cm/s TAPSE (M-mode): 1.6 cm LEFT ATRIUM           Index       RIGHT ATRIUM           Index LA diam:      3.60 cm 2.02 cm/m  RA Area:     11.40 cm LA Vol (A2C): 15.6 ml 8.77 ml/m  RA Volume:   25.90 ml  14.56 ml/m LA Vol (A4C): 29.7 ml 16.69 ml/m   AORTA Ao Root diam: 3.30 cm MITRAL VALVE               TRICUSPID VALVE MV Area (PHT): 5.75 cm    TR Peak grad:   49.8 mmHg MV Decel Time: 132 msec    TR Vmax:        353.00 cm/s MV E velocity: 48.00 cm/s MV A velocity: 80.60 cm/s  SHUNTS MV E/A ratio:  0.60        Systemic Diam: 2.10 cm Carlyle Dolly MD Electronically signed by Carlyle Dolly MD Signature Date/Time: 04/06/2020/10:34:56 AM    Final    DG Femur Portable Min 2 Views Right  Result Date: 04/05/2020 CLINICAL DATA:  Pain status post fall EXAM: RIGHT FEMUR PORTABLE 2 VIEW; RIGHT TIBIA AND FIBULA - 2 VIEW COMPARISON:  None. FINDINGS: There is no definite acute displaced fracture involving the tibia or fibula. Evaluation of nondisplaced fractures is limited by osteopenia. There is soft tissue swelling about the ankle. There is a small plantar calcaneal spur. Vascular calcifications are noted. The patient is status post prior intramedullary nail placement. The hardware appears grossly intact. There is an acute appearing impacted supracondylar fracture of the distal right femur. There is  surrounding soft tissue swelling. IMPRESSION: 1. Acute appearing impacted supracondylar fracture of the distal right femur. 2. No definite acute displaced fracture involving the tibia or fibula. Evaluation of nondisplaced fractures is limited by osteopenia. 3. Soft tissue swelling about the ankle. 4. Hardware appears grossly intact. Electronically Signed   By: Constance Holster M.D.   On: 04/05/2020 21:24        Scheduled Meds: . apixaban  5 mg Oral BID  . insulin aspart  0-15 Units Subcutaneous TID WC  . insulin detemir  25 Units Subcutaneous BID  . levETIRAcetam  500 mg Oral BID  . metoprolol succinate  25 mg Oral Daily   Continuous Infusions: . ceFEPime (MAXIPIME) IV Stopped (04/06/20 0905)     LOS: 1 day    Time spent: 35 minutes.    Dana Allan, MD  [facility] Pager #: [phone] 7PM-7AM contact night coverage as  above

## 2020-04-06 NOTE — ED Notes (Signed)
Son [name] called and provided update on patient.

## 2020-04-06 NOTE — Progress Notes (Signed)
Orthopedic Tech Progress Note Patient Details:  [name] 20-Sep-1945 [id number]  Ortho Devices Type of Ortho Device: Knee Immobilizer Ortho Device/Splint Location: rle Ortho Device/Splint Interventions: Ordered, Application, Adjustment   Post Interventions Patient Tolerated: Well Instructions Provided: Care of device, Adjustment of device   Karolee Stamps 04/06/2020, 4:37 AM

## 2020-04-06 NOTE — ED Notes (Signed)
SDU Breakfast Ordered 

## 2020-04-07 DIAGNOSIS — A419 Sepsis, unspecified organism: Secondary | ICD-10-CM | POA: Diagnosis not present

## 2020-04-07 DIAGNOSIS — N390 Urinary tract infection, site not specified: Secondary | ICD-10-CM | POA: Diagnosis not present

## 2020-04-07 LAB — CBC WITH DIFFERENTIAL/PLATELET
Abs Immature Granulocytes: 0.04 10*3/uL (ref 0.00–0.07)
Basophils Absolute: 0.1 10*3/uL (ref 0.0–0.1)
Basophils Relative: 1 %
Eosinophils Absolute: 0.5 10*3/uL (ref 0.0–0.5)
Eosinophils Relative: 5 %
HCT: 29.1 % — ABNORMAL LOW (ref 36.0–46.0)
Hemoglobin: 9.3 g/dL — ABNORMAL LOW (ref 12.0–15.0)
Immature Granulocytes: 0 %
Lymphocytes Relative: 22 %
Lymphs Abs: 2.1 10*3/uL (ref 0.7–4.0)
MCH: 28.7 pg (ref 26.0–34.0)
MCHC: 32 g/dL (ref 30.0–36.0)
MCV: 89.8 fL (ref 80.0–100.0)
Monocytes Absolute: 0.6 10*3/uL (ref 0.1–1.0)
Monocytes Relative: 6 %
Neutro Abs: 6.5 10*3/uL (ref 1.7–7.7)
Neutrophils Relative %: 66 %
Platelets: 152 10*3/uL (ref 150–400)
RBC: 3.24 MIL/uL — ABNORMAL LOW (ref 3.87–5.11)
RDW: 13.2 % (ref 11.5–15.5)
WBC: 9.8 10*3/uL (ref 4.0–10.5)
nRBC: 0.2 % (ref 0.0–0.2)

## 2020-04-07 LAB — COMPREHENSIVE METABOLIC PANEL
ALT: 23 U/L (ref 0–44)
AST: 27 U/L (ref 15–41)
Albumin: 2.5 g/dL — ABNORMAL LOW (ref 3.5–5.0)
Alkaline Phosphatase: 47 U/L (ref 38–126)
Anion gap: 9 (ref 5–15)
BUN: 10 mg/dL (ref 8–23)
CO2: 21 mmol/L — ABNORMAL LOW (ref 22–32)
Calcium: 8.2 mg/dL — ABNORMAL LOW (ref 8.9–10.3)
Chloride: 112 mmol/L — ABNORMAL HIGH (ref 98–111)
Creatinine, Ser: 0.96 mg/dL (ref 0.44–1.00)
GFR calc Af Amer: >60 mL/min (ref >60)
GFR calc non Af Amer: 58 mL/min — ABNORMAL LOW (ref >60)
Glucose, Bld: 149 mg/dL — ABNORMAL HIGH (ref 70–99)
Potassium: 3.4 mmol/L — ABNORMAL LOW (ref 3.5–5.1)
Sodium: 142 mmol/L (ref 135–145)
Total Bilirubin: 0.6 mg/dL (ref 0.3–1.2)
Total Protein: 4.8 g/dL — ABNORMAL LOW (ref 6.5–8.1)

## 2020-04-07 LAB — GLUCOSE, CAPILLARY
Glucose-Capillary: 133 mg/dL — ABNORMAL HIGH (ref 70–99)
Glucose-Capillary: 80 mg/dL (ref 70–99)
Glucose-Capillary: 193 mg/dL — ABNORMAL HIGH (ref 70–99)
Glucose-Capillary: 207 mg/dL — ABNORMAL HIGH (ref 70–99)
Glucose-Capillary: 81 mg/dL (ref 70–99)

## 2020-04-07 MED ORDER — POTASSIUM CHLORIDE 20 MEQ PO PACK
40.0000 meq | PACK | Freq: Once | ORAL | Status: AC
  Administered 2020-04-07: 40 meq via ORAL
  Filled 2020-04-07: qty 2

## 2020-04-07 NOTE — Progress Notes (Signed)
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  Patient was admitted following a fall and closed right distal femoral fracture.  Other findings on admission includes UTI, AKI, hyperkalemia, bilateral lower extremity edema, grade 1 diastolic dysfunction, hypertension, prolonged QT interval and abnormal liver function test.  There is documentation of sepsis in the H&P.  Orthopedic input is appreciated, conservative management is advised.  Patient is currently on IV cefepime, while cultures are pending.  Acute kidney injury is resolving.  Serum creatinine is down to 1.15.  Potassium is down to 3.9.  Patient was seen alongside patient's son.  04/07/2020: Patient seen.  No new changes plan plan is to discharge patient to skilled nursing facility.  Orthopedic team has advised conservative management.  Potassium of 3.4 noted.  Will replete potassium.   Assessment & Plan:   Principal Problem:   Sepsis secondary to UTI [facility]) Active Problems:   Hypertension   Right spastic hemiparesis (HCC)   Bilateral lower extremity edema   AKI (acute kidney injury) (HCC)   Hyperkalemia   Abnormal LFTs   Mild protein malnutrition (HCC)   Grade I diastolic dysfunction   Closed fracture of right distal femur (HCC)   Prolonged QT interval   Sepsis secondary to UTI POA ([place]) -Follow culture results.   -Continue antibiotics (IV cefepime).    AKI (acute kidney injury) ([place]) AKI has resolved.   Continue to hold losartan and furosemide for now.    Monitor intake and output. Follow-up renal function electrolytes.  Hyperkalemia Resolved.     Closed fracture of right distal femur [facility]) Orthopedic surgery input is appreciated, no surgery is planned.   Continue immobilization.   Follow-up with surgery in 2 weeks.   PT OT input appreciated, skilled nursing facility recommended.  Grade I diastolic dysfunction Holding ARB and diuretic due to AKI. Echocardiogram EF, grade 1 diastolic dysfunction and right ventricular strain.    Prolonged QT interval Repeat EKG: QT interval has resolved..  Hypertension Continue to hold hold losartan and furosemide. Continue metoprolol 25 mg p.o. twice daily. Blood pressures controlled.  Right spastic hemiparesis (HCC) Resume baclofen 10 mg p.o. 3 times daily. Supportive care.  Abnormal LFTs Resolved.  DVT prophylaxis: Eliquis Code Status: Full code Family Communication:  Disposition Plan: Skilled nursing facility   Consultants:   Orthopedics  Procedures:   None  Antimicrobials:   IV cefepime   Subjective: Right knee pain is well controlled  Objective: Vitals:   04/07/20 0403 04/07/20 0827 04/07/20 1225 04/07/20 1619  BP: 113/73 114/73 111/74 124/77  Pulse: 94 92 91 (!) 104  Resp: 15 18 18 18   Temp: 97.6 F (36.4 C) 97.8 F (36.6 C) 98.4 F (36.9 C) 99.2 F (37.3 C)  TempSrc: Oral Oral Oral Oral  SpO2: 95% 94% 93% 94%  Weight: 82.4 kg     Height:        Intake/Output Summary (Last 24 hours) at 04/07/2020 1822 Last data filed at 04/07/2020 1620 Gross per 24 hour  Intake 920 ml  Output 1750 ml  Net -830 ml   [employer]  04/05/20 1728 04/07/20 0403  Weight: 68.9 kg 82.4 kg    Examination:  General exam: Appears calm and comfortable  Respiratory system: Clear to auscultation.  Cardiovascular system: S1 & S2 Gastrointestinal system: Abdomen is obese, soft and nontender.  Organs are difficult to assess.   Central nervous system: Alert and  oriented. No focal neurological deficits. Extremities: Bilateral lower leg edema.  Splinting of the right femur/knee area.  Data Reviewed: I have personally reviewed following labs and imaging studies  CBC: Recent Labs  Lab 04/05/20 1820 04/06/20 0416 04/07/20 0216  WBC 14.5* 11.8* 9.8  NEUTROABS 12.7* 7.2 6.5  HGB 12.5 10.2* 9.3*  HCT 41.4 33.4* 29.1*  MCV 91.2 89.8 89.8  PLT 200 178 106   Basic Metabolic Panel: Recent Labs  Lab 04/05/20 1820 04/06/20 0416 04/06/20 1811 04/07/20 0216  NA 138 143  --  142  K 5.3* 3.9  --  3.4*  CL 107 109  --  112*  CO2 18* 25  --  21*  GLUCOSE 308* 221*  --  149*  BUN 13 12  --  10  CREATININE 1.37* 1.15*  --  0.96  CALCIUM 8.4* 8.5*  --  8.2*  MG  --   --  1.9  --    GFR: Estimated Creatinine Clearance: 54.8 mL/min (by C-G formula based on SCr of 0.96 mg/dL). Liver Function Tests: Recent Labs  Lab 04/05/20 1820 04/06/20 0416 04/07/20 0216  AST 68* 36 27  ALT 45* 35 23  ALKPHOS 74 60 47  BILITOT 1.6* 0.7 0.6  PROT 5.9* 5.2* 4.8*  ALBUMIN 3.2* 2.7* 2.5*   No results for input(s): LIPASE, AMYLASE in the last 168 hours. Recent Labs  Lab 04/05/20 1820  AMMONIA 25   Coagulation Profile: Recent Labs  Lab 04/06/20 0416  INR 1.2   Cardiac Enzymes: Recent Labs  Lab 04/05/20 1820  CKTOTAL 233   BNP (last 3 results) No results for input(s): PROBNP in the last 8760 hours. HbA1C: No results for input(s): HGBA1C in the last 72 hours. CBG: Recent Labs  Lab 04/06/20 1626 04/06/20 2209 04/07/20 0631 04/07/20 1224 04/07/20 1618  GLUCAP 122* 199* 80 193* 207*   Lipid Profile: No results for input(s): CHOL, HDL, LDLCALC, TRIG, CHOLHDL, LDLDIRECT in the last 72 hours. Thyroid Function Tests: No results for input(s): TSH, T4TOTAL, FREET4, T3FREE, THYROIDAB in the last 72 hours. Anemia Panel: No results for input(s): VITAMINB12, FOLATE, FERRITIN, TIBC, IRON, RETICCTPCT in the last 72 hours. Urine analysis:      Component Value Date/Time   COLORURINE YELLOW 04/05/2020 1747   APPEARANCEUR HAZY (A) 04/05/2020 1747   LABSPEC 1.012 04/05/2020 1747   PHURINE 5.0 04/05/2020 1747   GLUCOSEU 50 (A) 04/05/2020 1747   GLUCOSEU >1000 mg/dL (A) 03/21/2020 1013   HGBUR LARGE (A) 04/05/2020 1747   BILIRUBINUR NEGATIVE 04/05/2020 1747   KETONESUR NEGATIVE 04/05/2020 1747   PROTEINUR NEGATIVE 04/05/2020 1747   UROBILINOGEN 0.2 03/21/2020 1013   NITRITE NEGATIVE 04/05/2020 1747   LEUKOCYTESUR LARGE (A) 04/05/2020 1747   Sepsis Labs: @LABRCNTIP (procalcitonin:4,lacticidven:4)  ) Recent Results (from the past 240 hour(s))  Urine Culture     Status: None   Collection Time: 04/05/20  5:36 PM   Specimen: Urine, Random  Result Value Ref Range Status   Specimen Description URINE, RANDOM  Final   Special Requests NONE  Final   Culture   Final    NO GROWTH Performed at [facility], [place]  [address], [place], [place] [address]    Report Status 04/06/2020 FINAL  Final  Culture, blood (Routine X 2) w Reflex to ID Panel     Status: None (Preliminary result)   Collection Time: 04/05/20  6:20 PM   Specimen: BLOOD  Result Value Ref Range Status   Specimen Description BLOOD LEFT ANTECUBITAL  Final   Special Requests   Final    BOTTLES DRAWN AEROBIC AND ANAEROBIC Blood Culture results may not be optimal due to an inadequate volume of blood received in culture bottles   Culture   Final    NO GROWTH 2 DAYS Performed at [facility], [place] [address], [place], [place] [address]    Report Status PENDING  Incomplete  Culture, blood (Routine X 2) w Reflex to ID Panel     Status: None (Preliminary result)   Collection Time: 04/05/20  6:47 PM   Specimen: BLOOD LEFT HAND  Result Value Ref Range Status   Specimen Description BLOOD LEFT HAND  Final   Special Requests   Final    BOTTLES DRAWN AEROBIC AND ANAEROBIC Blood Culture results may not be optimal due to an inadequate volume of blood received in culture  bottles   Culture   Final    NO GROWTH 2 DAYS Performed at [facility], [place] [address] [place], [place] [address]    Report Status PENDING  Incomplete  Respiratory Panel by RT PCR (Flu A&B, Covid) - Nasopharyngeal Swab     Status: None   Collection Time: 04/05/20  6:49 PM   Specimen: Nasopharyngeal Swab  Result Value Ref Range Status   SARS Coronavirus 2 by RT PCR NEGATIVE NEGATIVE Final    Comment: (NOTE) SARS-CoV-2 target nucleic acids are NOT DETECTED.  The SARS-CoV-2 RNA is generally detectable in upper respiratoy specimens during the acute phase of infection. The lowest concentration of SARS-CoV-2 viral copies this assay can detect is 131 copies/mL. A negative result does not preclude SARS-Cov-2 infection and should not be used as the sole basis for treatment or other patient management decisions. A negative result may occur with  improper specimen collection/handling, submission of specimen other than nasopharyngeal swab, presence of viral mutation(s) within the areas targeted by this assay, and inadequate number of viral copies (<131 copies/mL). A negative result must be combined with clinical observations, patient history, and epidemiological information. The expected result is Negative.  Fact Sheet for Patients:  PinkCheek.be  Fact Sheet for Healthcare Providers:  GravelBags.it  This test is no t yet approved or cleared by the [place] FDA and  has been authorized for detection and/or diagnosis of SARS-CoV-2 by FDA under an Emergency Use Authorization (EUA). This EUA will remain  in effect (meaning this test can be used) for the duration of the COVID-19 declaration under Section 564(b)(1) of the Act, 21 U.S.C. section 360bbb-3(b)(1), unless the authorization is terminated or revoked sooner.     Influenza A by PCR NEGATIVE NEGATIVE Final   Influenza B by PCR NEGATIVE NEGATIVE Final    Comment:  (NOTE) The Xpert Xpress SARS-CoV-2/FLU/RSV assay is intended as an aid in  the diagnosis of influenza from Nasopharyngeal swab specimens and  should not be used as a sole basis for treatment. Nasal washings and  aspirates are unacceptable for Xpert Xpress SARS-CoV-2/FLU/RSV  testing.  Fact Sheet for Patients: PinkCheek.be  Fact Sheet for Healthcare Providers: GravelBags.it  This test is not yet approved or cleared by the [place] FDA and  has  been authorized for detection and/or diagnosis of SARS-CoV-2 by  FDA under an Emergency Use Authorization (EUA). This EUA will remain  in effect (meaning this test can be used) for the duration of the  Covid-19 declaration under Section 564(b)(1) of the Act, 21  U.S.C. section 360bbb-3(b)(1), unless the authorization is  terminated or revoked. Performed at [facility], [place] [address] [place], [place] [address]          Radiology Studies: DG Tibia/Fibula Right  Result Date: 04/05/2020 CLINICAL DATA:  Pain status post fall EXAM: RIGHT FEMUR PORTABLE 2 VIEW; RIGHT TIBIA AND FIBULA - 2 VIEW COMPARISON:  None. FINDINGS: There is no definite acute displaced fracture involving the tibia or fibula. Evaluation of nondisplaced fractures is limited by osteopenia. There is soft tissue swelling about the ankle. There is a small plantar calcaneal spur. Vascular calcifications are noted. The patient is status post prior intramedullary nail placement. The hardware appears grossly intact. There is an acute appearing impacted supracondylar fracture of the distal right femur. There is surrounding soft tissue swelling. IMPRESSION: 1. Acute appearing impacted supracondylar fracture of the distal right femur. 2. No definite acute displaced fracture involving the tibia or fibula. Evaluation of nondisplaced fractures is limited by osteopenia. 3. Soft tissue swelling about the ankle. 4. Hardware  appears grossly intact. Electronically Signed   By: Constance Holster M.D.   On: 04/05/2020 21:24   CT Knee Right Wo Contrast  Result Date: 04/05/2020 CLINICAL DATA:  Pain, fracture the EXAM: CT OF THE right KNEE WITHOUT CONTRAST TECHNIQUE: Multidetector CT imaging of the right knee was performed according to the standard protocol. Multiplanar CT image reconstructions were also generated. COMPARISON:  Radiograph same day FINDINGS: Bones/Joint/Cartilage The patient has had a prior IM nail fixation of the femur. There is a comminuted impacted distal femur metadiaphyseal fracture with extension through the medial and lateral condyles. There is also extension seen through the lateral intracondylar notch. A small fracture fragment is seen anteriorly. There is a nondisplaced posterior fibular head fracture. There is diffuse osteopenia. A moderate lipohemarthrosis is present. Ligaments Suboptimally assessed by CT. Muscles and Tendons Mild fatty atrophy seen within the muscles surrounding the knee. The patellar and quadriceps tendon are intact. Soft tissues There is diffuse subcutaneous edema seen along the lateral aspect of the knee. Scattered dense vascular calcifications are noted. IMPRESSION: Comminuted impacted fracture of the distal femoral metadiaphysis with extension through the lateral intracondylar notch. Moderate lipohemarthrosis. Probable nondisplaced posterior fibular head fracture. Electronically Signed   By: Prudencio Pair M.D.   On: 04/05/2020 23:24   DG Chest Port 1 View  Result Date: 04/05/2020 CLINICAL DATA:  Shortness of breath. EXAM: PORTABLE CHEST 1 VIEW COMPARISON:  June 10, 2015 FINDINGS: Decreased lung volumes are seen which is likely secondary to the degree of patient inspiration. Very mild atelectasis is seen within the left lung base. There is no evidence of a pleural effusion or pneumothorax. The cardiac silhouette is mildly enlarged and unchanged in size. There is marked severity  calcification of the thoracic aorta. The visualized skeletal structures are unremarkable. IMPRESSION: Very mild left basilar atelectasis. Electronically Signed   By: Virgina Norfolk M.D.   On: 04/05/2020 18:52   ECHOCARDIOGRAM COMPLETE  Result Date: 04/06/2020    ECHOCARDIOGRAM REPORT   Patient Name:   AILINE HEFFERAN Date of Exam: 04/06/2020 Medical Rec #:  [id number]       Height:       66.0 in Accession #:  [id number]      Weight:       151.9 lb Date of Birth:  01/08/1946        BSA:          1.779 m Patient Age:    15 years        BP:           111/68 mmHg Patient Gender: F               HR:           73 bpm. Exam Location:  Inpatient Procedure: 2D Echo, Cardiac Doppler, Color Doppler and Intracardiac            Opacification Agent Indications:    Congestive Heart Failure 428.0 / I50.9  History:        Patient has prior history of Echocardiogram examinations, most                 recent 05/05/2015. CHF, CAD, Stroke; Risk Factors:Hypertension,                 Dyslipidemia and Former Smoker.  Sonographer:    Vickie Epley RDCS Referring Phys: [id number] [place]  Sonographer Comments: No apical window and no subcostal window. IMPRESSIONS  1. Left ventricular ejection fraction, by estimation, is 55 to 60%. The left ventricle has normal function. The left ventricle has no regional wall motion abnormalities. Left ventricular diastolic parameters are consistent with Grade I diastolic dysfunction (impaired relaxation).  2. Limited visualization of RV. Best seen in the contrast subcostal images with evidence of moderate RV enlargement, probably mild to moderate dysfunction. . Right ventricular systolic function mild to moderately decreased. The right ventricular size is  severely enlarged. There is moderately elevated pulmonary artery systolic pressure.  3. The mitral valve is normal in structure. No evidence of mitral valve regurgitation. No evidence of mitral stenosis.  4. Tricuspid valve regurgitation is  moderate.  5. The aortic valve is tricuspid. Aortic valve regurgitation is not visualized. No aortic stenosis is present.  6. IVC is poorly visualized limiting PASP assessment. At least moderate pulmonary HTN (PASP is at least 53 mmHg). FINDINGS  Left Ventricle: Left ventricular ejection fraction, by estimation, is 55 to 60%. The left ventricle has normal function. The left ventricle has no regional wall motion abnormalities. Definity contrast agent was given IV to delineate the left ventricular  endocardial borders. The left ventricular internal cavity size was normal in size. There is no left ventricular hypertrophy. Left ventricular diastolic parameters are consistent with Grade I diastolic dysfunction (impaired relaxation). Normal left ventricular filling pressure. Right Ventricle: Limited visualization of RV. Best seen in the contrast subcostal images with evidence of moderate RV enlargement, probably mild to moderate dysfunction. The right ventricular size is severely enlarged. Right vetricular wall thickness was  not well visualized. Right ventricular systolic function mild to moderately decreased. There is moderately elevated pulmonary artery systolic pressure. The tricuspid regurgitant velocity is 3.53 m/s, and with an assumed right atrial pressure of 3 mmHg, the estimated right ventricular systolic pressure is 35.3 mmHg. Left Atrium: Left atrial size was normal in size. Right Atrium: Right atrial size was normal in size. Pericardium: There is no evidence of pericardial effusion. Mitral Valve: The mitral valve is normal in structure. No evidence of mitral valve regurgitation. No evidence of mitral valve stenosis. Tricuspid Valve: The tricuspid valve is not well visualized. Tricuspid valve regurgitation is moderate . No evidence of tricuspid stenosis. Aortic Valve:  The aortic valve is tricuspid. Aortic valve regurgitation is not visualized. No aortic stenosis is present. Pulmonic Valve: The pulmonic valve  was not well visualized. Pulmonic valve regurgitation is not visualized. No evidence of pulmonic stenosis. Aorta: The aortic root is normal in size and structure. Pulmonary Artery: IVC is poorly visualized limiting PASP assessment. At least moderate pulmonary HTN (PASP is at least 53 mmHg). Venous: The inferior vena cava was not well visualized. IAS/Shunts: The interatrial septum was not well visualized.  LEFT VENTRICLE PLAX 2D LVIDd:         3.30 cm     Diastology LVIDs:         2.50 cm     LV e' medial:    5.87 cm/s LV PW:         0.90 cm     LV E/e' medial:  8.2 LV IVS:        0.90 cm     LV e' lateral:   9.25 cm/s LVOT diam:     2.10 cm     LV E/e' lateral: 5.2 LVOT Area:     3.46 cm  LV Volumes (MOD) LV vol d, MOD A4C: 51.9 ml LV vol s, MOD A4C: 19.3 ml LV SV MOD A4C:     51.9 ml RIGHT VENTRICLE RV S prime:     12.40 cm/s TAPSE (M-mode): 1.6 cm LEFT ATRIUM           Index       RIGHT ATRIUM           Index LA diam:      3.60 cm 2.02 cm/m  RA Area:     11.40 cm LA Vol (A2C): 15.6 ml 8.77 ml/m  RA Volume:   25.90 ml  14.56 ml/m LA Vol (A4C): 29.7 ml 16.69 ml/m   AORTA Ao Root diam: 3.30 cm MITRAL VALVE               TRICUSPID VALVE MV Area (PHT): 5.75 cm    TR Peak grad:   49.8 mmHg MV Decel Time: 132 msec    TR Vmax:        353.00 cm/s MV E velocity: 48.00 cm/s MV A velocity: 80.60 cm/s  SHUNTS MV E/A ratio:  0.60        Systemic Diam: 2.10 cm Carlyle Dolly MD Electronically signed by Carlyle Dolly MD Signature Date/Time: 04/06/2020/10:34:56 AM    Final    DG Femur Portable Min 2 Views Right  Result Date: 04/05/2020 CLINICAL DATA:  Pain status post fall EXAM: RIGHT FEMUR PORTABLE 2 VIEW; RIGHT TIBIA AND FIBULA - 2 VIEW COMPARISON:  None. FINDINGS: There is no definite acute displaced fracture involving the tibia or fibula. Evaluation of nondisplaced fractures is limited by osteopenia. There is soft tissue swelling about the ankle. There is a small plantar calcaneal spur. Vascular calcifications are  noted. The patient is status post prior intramedullary nail placement. The hardware appears grossly intact. There is an acute appearing impacted supracondylar fracture of the distal right femur. There is surrounding soft tissue swelling. IMPRESSION: 1. Acute appearing impacted supracondylar fracture of the distal right femur. 2. No definite acute displaced fracture involving the tibia or fibula. Evaluation of nondisplaced fractures is limited by osteopenia. 3. Soft tissue swelling about the ankle. 4. Hardware appears grossly intact. Electronically Signed   By: Constance Holster M.D.   On: 04/05/2020 21:24        Scheduled Meds: . apixaban  5  mg Oral BID  . insulin aspart  0-15 Units Subcutaneous TID WC  . insulin detemir  25 Units Subcutaneous BID  . levETIRAcetam  500 mg Oral BID  . metoprolol succinate  25 mg Oral Daily  . potassium chloride  40 mEq Oral Once   Continuous Infusions: . ceFEPime (MAXIPIME) IV 2 g (04/07/20 0843)     LOS: 2 days    Time spent: 25 minutes.    Dana Allan, MD  [facility] Pager #: [phone] 7PM-7AM contact night coverage as above

## 2020-04-07 NOTE — Discharge Instructions (Signed)
Information on my medicine - ELIQUIS (apixaban)  Why was Eliquis prescribed for you? Eliquis was prescribed for you to reduce the risk of a blood clot forming that can cause a stroke.  What do You need to know about Eliquis ? Take your Eliquis TWICE DAILY - one tablet in the morning and one tablet in the evening with or without food. If you have difficulty swallowing the tablet whole please discuss with your pharmacist how to take the medication safely.  Take Eliquis exactly as prescribed by your doctor and DO NOT stop taking Eliquis without talking to the doctor who prescribed the medication.  Stopping may increase your risk of developing a stroke.  Refill your prescription before you run out.  After discharge, you should have regular check-up appointments with your healthcare provider that is prescribing your Eliquis.  In the future your dose may need to be changed if your kidney function or weight changes by a significant amount or as you get older.  What do you do if you miss a dose? If you miss a dose, take it as soon as you remember on the same day and resume taking twice daily.  Do not take more than one dose of ELIQUIS at the same time to make up a missed dose.  Important Safety Information A possible side effect of Eliquis is bleeding. You should call your healthcare provider right away if you experience any of the following: ? Bleeding from an injury or your nose that does not stop. ? Unusual colored urine (red or dark brown) or unusual colored stools (red or black). ? Unusual bruising for unknown reasons. ? A serious fall or if you hit your head (even if there is no bleeding).  Some medicines may interact with Eliquis and might increase your risk of bleeding or clotting while on Eliquis. To help avoid this, consult your healthcare provider or pharmacist prior to using any new prescription or non-prescription medications, including herbals, vitamins, non-steroidal  anti-inflammatory drugs (NSAIDs) and supplements.  This website has more information on Eliquis (apixaban): http://[email or web address].com/eliquis/home

## 2020-04-07 NOTE — Evaluation (Signed)
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  Patient was admitted following a fall and closed right distal femoral fracture.  Other findings on admission includes UTI, AKI, hyperkalemia, bilateral lower extremity edema, grade 1 diastolic dysfunction, hypertension, prolonged QT interval and abnormal liver function test.    Clinical Impression  Pt very anxious with therapist assisting with any movement, with increased time pt able to assist with movement of L UE and LLE, pt requiring complete assist for movement of RUE and LE with pt yelling due to pain; attempted rolling and scooting up in bed with pt resistant and requiring total assist; pt will benefit from skilled PT to address deficits in bed mobility, transfer, strength and balance to return to PLOF; please follow up with family to confirm PLOF with transfers only with granddaughter    Follow Up Recommendations SNF;Supervision/Assistance - 24 hour    Equipment Recommendations  Other (comment) (TBD)    Recommendations for Other Services       Precautions / Restrictions Precautions Precautions: Fall Required Braces or Orthoses: Knee Immobilizer - Right Knee Immobilizer - Right: On at all times Restrictions Weight Bearing Restrictions: Yes RUE Weight Bearing: Non weight bearing RLE Weight Bearing: Non weight bearing      Mobility  Bed  Mobility Overal bed mobility: Needs Assistance Bed Mobility: Rolling Rolling: Total assist         General bed mobility comments: attempted to roll R with pt pulling on therapist, when initiated pt resistant due to pain  Transfers                    Ambulation/Gait                Stairs            Wheelchair Mobility    Modified Rankin (Stroke Patients Only)       Balance                                             Pertinent Vitals/Pain Pain Assessment: Faces Faces Pain Scale: Hurts worst Pain Location: R side arm, leg and back Pain Intervention(s): Repositioned    Home Living Family/patient expects to be discharged to:: Private residence Living Arrangements: Other relatives;Children Available Help at Discharge: Family Type of Home: House Home Access: Level entry     Home Layout: One level        Prior Function Level of Independence: Needs assistance   Gait / Transfers Assistance Needed: pt was requiring assist to transfer from recliner<>w/c and w/c<>toilet; pt states she was getting sponge baths prior to admission; granddaughter is assisting with all transfers and ADLS, pt states she was able to help some           Hand Dominance   Dominant Hand: Right    Extremity/Trunk Assessment   Upper Extremity Assessment Upper Extremity  Assessment: RUE deficits/detail RUE Deficits / Details: increased tone from previous stroke RUE: Unable to fully assess due to pain RUE Sensation: decreased light touch RUE Coordination: decreased gross motor;decreased fine motor    Lower Extremity Assessment Lower Extremity Assessment: RLE deficits/detail RLE: Unable to fully assess due to pain       Communication   Communication: Expressive difficulties  Cognition Arousal/Alertness: Awake/alert Behavior During Therapy: Anxious Overall Cognitive Status: No family/caregiver present to determine baseline cognitive functioning                                  General Comments: VSS stable during session; pt anxious throughout session, pt resistant to all movements      General Comments General comments (skin integrity, edema, etc.): unable to assist sitting balance due to pt resistant to movement; therapist able to assist with LLE abduction wtih pt participating in movement, initiated adduction of RLE with pt refusing due to pain; total assist to scoot up in the bed with use of chuck pad    Exercises     Assessment/Plan    PT Assessment Patient needs continued PT services  PT Problem List Decreased strength;Decreased mobility;Decreased safety awareness;Impaired tone;Decreased range of motion;Decreased coordination;Decreased activity tolerance;Decreased cognition;Decreased balance;Pain       PT Treatment Interventions Therapeutic exercise;Balance training;Neuromuscular re-education;Therapeutic activities;Functional mobility training;Patient/family education    PT Goals (Current goals can be found in the Care Plan section)  Acute Rehab PT Goals Patient Stated Goal: unable to state goal due to increase in pain, pt perseverating on pain Time For Goal Achievement: 04/21/20 Potential to Achieve Goals: Fair    Frequency Min 2X/week   Barriers to discharge Other (comment) pt resistant to movement at this time, unable to fully assess if pt is able to participate in transfers    Co-evaluation               AM-PAC PT "6 Clicks" Mobility  Outcome Measure Help needed turning from your back to your side while in a flat bed without using bedrails?: Total Help needed moving from lying on your back to sitting on the side of a flat bed without using bedrails?: Total Help needed moving to and from a bed to a chair (including a wheelchair)?: Total Help needed standing up from a chair using your arms (e.g., wheelchair or bedside chair)?: Total Help needed to walk in hospital room?: Total Help needed  climbing 3-5 steps with a railing? : Total 6 Click Score: 6    End of Session Equipment Utilized During Treatment: Right knee immobilizer Activity Tolerance: Patient limited by pain Patient left: in bed;with bed alarm set;with call bell/phone within reach Nurse Communication: Mobility status PT Visit Diagnosis: Other abnormalities of gait and mobility (R26.89);Muscle weakness (generalized) (M62.81);Pain Pain - Right/Left: Right Pain - part of body: Leg;Arm    Time: 3729-0211 PT Time Calculation (min) (ACUTE ONLY): 13 min   Charges:   PT Evaluation $PT Eval Low Complexity: 1 Low          Lyanne Co, DPT Acute Rehabilitation Services [phone]  [place] 04/07/2020, 1:48 PM

## 2020-04-07 NOTE — Progress Notes (Signed)
Nutrition Brief Note  RD consulted for assessment of nutritional requirements/ status.   Wt Readings from Last 15 Encounters:  04/07/20 82.4 kg  02/06/19 68.9 kg  08/18/18 70.1 kg  10/26/17 70.8 kg  07/20/17 68.9 kg  10/12/16 69.9 kg  01/15/16 63.5 kg  07/30/15 66.5 kg  07/21/15 65.7 kg  06/17/15 65.9 kg  05/21/15 67 kg  04/28/15 72.9 kg  11/13/12 74.1 kg   [name] is a 74 y.o. 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  Pt admitted with sepsis secondary to UTI.   Reviewed I/O's: +580 ml x 24 hours and +978 ml since admission  UOP: 1.7 L x 24 hours  Per orthopedics notes, pt with rt distal femur fracture; plan for non-operative management at this time (knee immobilizer).   Spoke with pt at bedside, who responded mostly to close ended questions. She reports good appetite PTA, consuming 3 meals per day. Observed breakfast tray; pt consumed 100%. RD assisted pt with opening packets of pretzels and animal crackers, which pt consumed without difficulty.   Reviewed wt hx; no wt loss over the past year. Pt denies any weight loss.   Nutrition-Focused physical exam completed. Findings are no fat depletion, no muscle depletion, and mild edema.   Lab Results  Component Value Date   HGBA1C 12.0 (H) 03/20/2020   PTA DM medications are 25 units insulin detemir BID. Per ADA's Standards of Medical Care of Diabetes, glycemic targets for older adults who have multiple co-morbidities, cognitive  impairments, and functional dependence should be less stringent (Hgb A1c <8.0-8.5).    Labs reviewed: K: 3.4, CBGS: 80-199 (inpatient orders for glycemic control are 0-15 units inuslin aspart TID with meals and 25 units inuslin detemir BID).   Albumin has a half-life of 21 days and is strongly affected by stress response and inflammatory process, therefore, do not expect to see an improvement in this lab value during acute hospitalization. When a patient presents with low albumin, it is likely skewed due to the acute inflammatory response.  Unless it is suspected that patient had poor PO intake or malnutrition prior to admission, then RD should not be consulted solely for low albumin. Note that low albumin is no longer used to diagnose malnutrition; [place] uses the new malnutrition guidelines published by the American Society for Parenteral and Enteral Nutrition (A.S.P.E.N.) and the Academy of Nutrition and Dietetics (AND).    Current diet order is heart healthy/ carb modified, patient is consuming approximately  80-100%% of meals at this time. Labs and medications reviewed.   No nutrition interventions warranted at this time. If nutrition issues arise, please consult RD.   Loistine Chance, RD, LDN, [place] Registered Dietitian II Certified Diabetes Care and Education Specialist Please refer to [facility] for RD and/or RD on-call/weekend/after hours pager

## 2020-04-08 DIAGNOSIS — R945 Abnormal results of liver function studies: Secondary | ICD-10-CM

## 2020-04-08 DIAGNOSIS — S72401A Unspecified fracture of lower end of right femur, initial encounter for closed fracture: Secondary | ICD-10-CM | POA: Diagnosis not present

## 2020-04-08 DIAGNOSIS — N179 Acute kidney failure, unspecified: Secondary | ICD-10-CM

## 2020-04-08 DIAGNOSIS — R9431 Abnormal electrocardiogram [ECG] [EKG]: Secondary | ICD-10-CM

## 2020-04-08 DIAGNOSIS — E875 Hyperkalemia: Secondary | ICD-10-CM

## 2020-04-08 DIAGNOSIS — A419 Sepsis, unspecified organism: Secondary | ICD-10-CM | POA: Diagnosis not present

## 2020-04-08 DIAGNOSIS — G8111 Spastic hemiplegia affecting right dominant side: Secondary | ICD-10-CM

## 2020-04-08 LAB — CBC WITH DIFFERENTIAL/PLATELET
Abs Immature Granulocytes: 0.04 10*3/uL (ref 0.00–0.07)
Basophils Absolute: 0 10*3/uL (ref 0.0–0.1)
Basophils Relative: 0 %
Eosinophils Absolute: 0.3 10*3/uL (ref 0.0–0.5)
Eosinophils Relative: 3 %
HCT: 30.6 % — ABNORMAL LOW (ref 36.0–46.0)
Hemoglobin: 9.4 g/dL — ABNORMAL LOW (ref 12.0–15.0)
Immature Granulocytes: 0 %
Lymphocytes Relative: 24 %
Lymphs Abs: 2.3 10*3/uL (ref 0.7–4.0)
MCH: 27.5 pg (ref 26.0–34.0)
MCHC: 30.7 g/dL (ref 30.0–36.0)
MCV: 89.5 fL (ref 80.0–100.0)
Monocytes Absolute: 0.9 10*3/uL (ref 0.1–1.0)
Monocytes Relative: 9 %
Neutro Abs: 6.1 10*3/uL (ref 1.7–7.7)
Neutrophils Relative %: 64 %
Platelets: 139 10*3/uL — ABNORMAL LOW (ref 150–400)
RBC: 3.42 MIL/uL — ABNORMAL LOW (ref 3.87–5.11)
RDW: 13.2 % (ref 11.5–15.5)
WBC: 9.6 10*3/uL (ref 4.0–10.5)
nRBC: 0 % (ref 0.0–0.2)

## 2020-04-08 LAB — GLUCOSE, CAPILLARY
Glucose-Capillary: 51 mg/dL — ABNORMAL LOW (ref 70–99)
Glucose-Capillary: 56 mg/dL — ABNORMAL LOW (ref 70–99)
Glucose-Capillary: 128 mg/dL — ABNORMAL HIGH (ref 70–99)
Glucose-Capillary: 265 mg/dL — ABNORMAL HIGH (ref 70–99)
Glucose-Capillary: 138 mg/dL — ABNORMAL HIGH (ref 70–99)
Glucose-Capillary: 66 mg/dL — ABNORMAL LOW (ref 70–99)
Glucose-Capillary: 68 mg/dL — ABNORMAL LOW (ref 70–99)
Glucose-Capillary: 96 mg/dL (ref 70–99)

## 2020-04-08 LAB — RENAL FUNCTION PANEL
Albumin: 2.5 g/dL — ABNORMAL LOW (ref 3.5–5.0)
Anion gap: 13 (ref 5–15)
BUN: 9 mg/dL (ref 8–23)
CO2: 23 mmol/L (ref 22–32)
Calcium: 8.4 mg/dL — ABNORMAL LOW (ref 8.9–10.3)
Chloride: 105 mmol/L (ref 98–111)
Creatinine, Ser: 0.89 mg/dL (ref 0.44–1.00)
GFR calc non Af Amer: >60 mL/min (ref >60)
Glucose, Bld: 220 mg/dL — ABNORMAL HIGH (ref 70–99)
Phosphorus: 3.2 mg/dL (ref 2.5–4.6)
Potassium: 3.5 mmol/L (ref 3.5–5.1)
Sodium: 141 mmol/L (ref 135–145)

## 2020-04-08 LAB — MAGNESIUM: Magnesium: 1.8 mg/dL (ref 1.7–2.4)

## 2020-04-08 MED ORDER — GLUCOSE 40 % PO GEL
ORAL | Status: AC
  Filled 2020-04-08: qty 1

## 2020-04-08 MED ORDER — CEPHALEXIN 500 MG PO CAPS
500.0000 mg | ORAL_CAPSULE | Freq: Three times a day (TID) | ORAL | Status: AC
  Administered 2020-04-08 – 2020-04-09 (×5): 500 mg via ORAL
  Filled 2020-04-08 (×5): qty 1

## 2020-04-08 MED ORDER — INSULIN DETEMIR 100 UNIT/ML ~~LOC~~ SOLN
15.0000 [IU] | Freq: Two times a day (BID) | SUBCUTANEOUS | Status: DC
  Filled 2020-04-08 (×3): qty 0.15

## 2020-04-08 MED ORDER — INSULIN DETEMIR 100 UNIT/ML ~~LOC~~ SOLN
10.0000 [IU] | Freq: Two times a day (BID) | SUBCUTANEOUS | Status: DC
  Filled 2020-04-08: qty 0.1

## 2020-04-08 NOTE — NC FL2 (Signed)
[place] LEVEL OF CARE SCREENING TOOL     IDENTIFICATION  Patient Name: [name] Birthdate: 11-29-1945 Sex: female Admission Date (Current Location): 04/05/2020  [facility] and [place] Number:  Herbalist and Address:  [facility], [place] [address] [place], [place] [address]      Provider Number: [id number]  Attending Physician Name and Address:  Aline August, MD  Relative Name and Phone Number:  [name], son , [phone]    Current Level of Care: Hospital Recommended Level of Care: [place] Prior Approval Number:    Date Approved/Denied:   PASRR Number: [id number]  Discharge Plan: SNF    Current Diagnoses: Patient Active Problem List   Diagnosis Date Noted  . Sepsis secondary to UTI ([place]) 04/05/2020  . AKI (acute kidney injury) ([place]) 04/05/2020  . Hyperkalemia 04/05/2020  . Abnormal LFTs 04/05/2020  . Mild protein malnutrition ([place]) 04/05/2020  . Grade I diastolic dysfunction 71/69/6789  . Closed fracture of right distal femur ([place]) 04/05/2020  . Prolonged QT interval 04/05/2020  . Dysuria 06/12/2019  . Preventative health care 08/18/2018  . Partial symptomatic epilepsy with complex partial seizures, not intractable, without status epilepticus ([place]) 12/08/2017  . Shingles 10/26/2017  . Constipation 04/20/2017  . Skin ulcer of right heel, limited to breakdown of skin ([place]) 02/10/2017  . Muscle spasticity 01/20/2016  . Bilateral lower extremity edema 12/23/2015  . Acute confusional state 10/28/2015  . Right spastic hemiparesis ([place]) 10/07/2015  . Right shoulder pain 07/24/2015  . Fracture, intertrochanteric, right femur ([place]) 06/10/2015  . Stercoral ulcer of rectum 05/16/2015  . GI bleed 05/13/2015  . BRBPR (bright red blood per rectum) 05/13/2015  . CHF (congestive heart failure) ([place])   . Diabetes ([place])   . Hypertension   . History of stroke   . Lower GI bleed   . Left-sided  cerebrovascular accident (CVA) ([place]) 04/28/2015    Orientation RESPIRATION BLADDER Height & Weight     Self, Situation, Place  Normal Incontinent, External catheter Weight: 182 lb 8.7 oz (82.8 kg) Height:  5\' 6"  (167.6 cm)  BEHAVIORAL SYMPTOMS/MOOD NEUROLOGICAL BOWEL NUTRITION STATUS      Continent Diet (Please see DC Summary)  AMBULATORY STATUS COMMUNICATION OF NEEDS Skin   Extensive Assist Verbally Normal                       Personal Care Assistance Level of Assistance  Bathing, Feeding, Dressing Bathing Assistance: Maximum assistance Feeding assistance: Limited assistance Dressing Assistance: Maximum assistance     Functional Limitations Info  Sight, Hearing, Speech Sight Info: Adequate Hearing Info: Adequate Speech Info: Adequate    SPECIAL CARE FACTORS FREQUENCY  PT (By licensed PT), OT (By licensed OT)     PT Frequency: 5x/week OT Frequency: 5x/week            Contractures Contractures Info: Not present    Additional Factors Info  Code Status, Allergies, Isolation Precautions, Insulin Sliding Scale Code Status Info: Full Allergies Info: Pork-derived products   Insulin Sliding Scale Info: See DC Summary for dose Isolation Precautions Info: Hx of ESBL     Current Medications (04/08/2020):  This is the current hospital active medication list Current Facility-Administered Medications  Medication Dose Route Frequency Provider Last Rate Last Admin  . acetaminophen (TYLENOL) tablet 650 mg  650 mg Oral Q6H PRN Reubin Milan, MD   650 mg at 04/07/20 0617  . apixaban (ELIQUIS) tablet  5 mg  5 mg Oral BID Reubin Milan, MD   5 mg at 04/08/20 1124  . baclofen (LIORESAL) tablet 10 mg  10 mg Oral TID PRN Reubin Milan, MD   10 mg at 04/06/20 1326  . ceFEPIme (MAXIPIME) 2 g in sodium chloride 0.9 % 100 mL IVPB  2 g Intravenous Q12H Reubin Milan, MD 200 mL/hr at 04/07/20 2028 2 g at 04/07/20 2028  . fentaNYL (SUBLIMAZE) injection 25 mcg  25  mcg Intravenous Q2H PRN Reubin Milan, MD   25 mcg at 04/05/20 2241  . insulin aspart (novoLOG) injection 0-15 Units  0-15 Units Subcutaneous TID WC Reubin Milan, MD   5 Units at 04/07/20 1652  . insulin detemir (LEVEMIR) injection 15 Units  15 Units Subcutaneous BID Alekh, Kshitiz, MD      . levETIRAcetam (KEPPRA) tablet 500 mg  500 mg Oral BID Reubin Milan, MD   500 mg at 04/08/20 1124  . metoprolol succinate (TOPROL-XL) 24 hr tablet 25 mg  25 mg Oral Daily Reubin Milan, MD   25 mg at 04/08/20 1124  . prochlorperazine (COMPAZINE) injection 5 mg  5 mg Intravenous Q4H PRN Reubin Milan, MD         Discharge Medications: Please see discharge summary for a list of discharge medications.  Relevant Imaging Results:  Relevant Lab Results:   Additional Information SS#: [id number]. Pfizer vaccines on 09/01/2019, 08/11/2019  Benard Halsted, LCSW

## 2020-04-08 NOTE — Progress Notes (Signed)
Patient ID: [name], female   DOB: April 11, 1946, 74 y.o.   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  Orthopedics recommended conservative management.  She was treated with IV antibiotics and fluids.  Assessment & Plan:  Closed right distal femur fracture status post fall -Orthopedics recommended conservative management with immobilization and outpatient follow-up with orthopedics in the office in 2 weeks -PT/OT recommend SNF placement.  Social worker consult  Sepsis: Present on admission UTI -Cultures negative so far.  Currently on cefepime.  We will switch to oral Keflex for 2 more days. -Hemodynamically improving.  Sepsis has resolved.  Acute kidney injury -Resolved.  Creatinine 0.89 today.  Losartan and Lasix on hold for now.  Prolonged QT interval -Improved.  Hypertension -Blood pressure on the lower side.  Continue metoprolol.  Diabetes mellitus type 2 uncontrolled with hyperglycemia and hypoglycemia -Episodes of hypoglycemia this morning.  Decrease Levemir to 15 units twice a day.  History of right spastic hemiparesis following left-sided embolic CVA -Fall precautions.  Resume baclofen.  Continue Eliquis  Abnormal LFTs -Resolved.  Resume statin on discharge  Generalized conditioning -Overall prognosis is guarded to poor as patient is mostly bedridden with spastic right-sided hemiparesis and now has a distal femur fracture.  She is at high risk for of  decompensation and infections.  Spoke to son who wants patient to be full code.  Will request palliative care consultation for goals of care discussion.  DVT prophylaxis: Eliquis Code Status: Full Family Communication: Son/Terry on phone on 04/08/2020 Disposition Plan: Status is: Inpatient  Remains inpatient appropriate because:Inpatient level of care appropriate due to severity of illness   Dispo: The patient is from: Home              Anticipated d/c is to: SNF              Anticipated d/c date is: 1 day              Patient currently is not medically stable to d/c.   Consultants: Orthopedics  Procedures: None  Antimicrobials:  Anti-infectives (From admission, onward)   Start     Dose/Rate Route Frequency Ordered Stop   04/06/20 0800  ceFEPIme (MAXIPIME) 2 g in sodium chloride 0.9 % 100 mL IVPB        2 g 200 mL/hr over 30 Minutes Intravenous Every 12 hours 04/05/20 1931     04/05/20 1930  ceFEPIme (MAXIPIME) 2 g in sodium chloride 0.9 % 100 mL IVPB        2 g 200 mL/hr over 30 Minutes Intravenous  Once 04/05/20 1921 04/05/20 2210   04/05/20 1915  cefTRIAXone (ROCEPHIN) 1 g in sodium chloride 0.9 % 100 mL IVPB  Status:  Discontinued        1 g 200 mL/hr over 30 Minutes Intravenous  Once 04/05/20 1911 04/05/20 1921        Subjective: Patient seen and examined at bedside.  Poor historian.  Sleepy, wakes up slightly, slightly confused.  Hardly engages in any  conversation.  Nursing staff reported episodes of hypoglycemia this morning.  Objective: Vitals:   04/07/20 2300 04/08/20 0355 04/08/20 0632 04/08/20 0900  BP: (!) 107/58 (!) 93/59  109/63  Pulse: (!) 108 93 84 94  Resp: 20 (!) 26 17 (!) 22  Temp: 99 F (37.2 C) 97.8 F (36.6 C)  98.5 F (36.9 C)  TempSrc: Oral Oral  Oral  SpO2: 90% 94% 94% 100%  Weight:   82.8 kg   Height:        Intake/Output Summary (Last 24 hours) at 04/08/2020 1344 Last data filed at 04/08/2020 0955 Gross per 24 hour  Intake 360 ml    Output 1200 ml  Net -840 ml   Filed Weights   04/05/20 1728 04/07/20 0403 04/08/20 0454  Weight: 68.9 kg 82.4 kg 82.8 kg    Examination:  General exam: Appears calm and comfortable.  Looks chronically ill. Respiratory system: Bilateral decreased breath sounds at bases with scattered crackles cardiovascular system: S1 & S2 heard, Rate controlled Gastrointestinal system: Abdomen is nondistended, soft and nontender. Normal bowel sounds heard. Extremities: No cyanosis, clubbing; trace lower extremity edema Central nervous system: Poor historian.  Sleepy, wakes up slightly, slightly confused.  Hardly engages in any conversation.  Skin: No rashes, lesions or ulcers Psychiatry: Could not be assessed because of mental status    Data Reviewed: I have personally reviewed following labs and imaging studies  CBC: Recent Labs  Lab 04/05/20 1820 04/06/20 0416 04/07/20 0216 04/08/20 1050  WBC 14.5* 11.8* 9.8 9.6  NEUTROABS 12.7* 7.2 6.5 6.1  HGB 12.5 10.2* 9.3* 9.4*  HCT 41.4 33.4* 29.1* 30.6*  MCV 91.2 89.8 89.8 89.5  PLT 200 178 152 098*   Basic Metabolic Panel: Recent Labs  Lab 04/05/20 1820 04/06/20 0416 04/06/20 1811 04/07/20 0216 04/08/20 1050  NA 138 143  --  142 141  K 5.3* 3.9  --  3.4* 3.5  CL 107 109  --  112* 105  CO2 18* 25  --  21* 23  GLUCOSE 308* 221*  --  149* 220*  BUN 13 12  --  10 9  CREATININE 1.37* 1.15*  --  0.96 0.89  CALCIUM 8.4* 8.5*  --  8.2* 8.4*  MG  --   --  1.9  --  1.8  PHOS  --   --   --   --  3.2   GFR: Estimated Creatinine Clearance: 59.2 mL/min (by C-G formula based on SCr of 0.89 mg/dL). Liver Function Tests: Recent Labs  Lab 04/05/20 1820 04/06/20 0416 04/07/20 0216 04/08/20 1050  AST 68* 36 27  --   ALT 45* 35 23  --   ALKPHOS 74 60 47  --   BILITOT 1.6* 0.7 0.6  --   PROT 5.9* 5.2* 4.8*  --   ALBUMIN 3.2* 2.7* 2.5* 2.5*   No results for input(s): LIPASE, AMYLASE in the last 168 hours. Recent Labs  Lab 04/05/20 1820   AMMONIA 25   Coagulation Profile: Recent Labs  Lab 04/06/20 0416  INR 1.2   Cardiac Enzymes: Recent Labs  Lab 04/05/20 1820  CKTOTAL 233   BNP (last 3 results) No results for input(s): PROBNP in the last 8760 hours. HbA1C: No results for input(s): HGBA1C in the last 72 hours. CBG: Recent Labs  Lab 04/07/20 2110 04/08/20 0630 04/08/20 0656 04/08/20 0812 04/08/20 1109  GLUCAP 81 51* 56* 128* 265*   Lipid Profile: No results for input(s): CHOL, HDL, LDLCALC, TRIG,  CHOLHDL, LDLDIRECT in the last 72 hours. Thyroid Function Tests: No results for input(s): TSH, T4TOTAL, FREET4, T3FREE, THYROIDAB in the last 72 hours. Anemia Panel: No results for input(s): VITAMINB12, FOLATE, FERRITIN, TIBC, IRON, RETICCTPCT in the last 72 hours. Sepsis Labs: Recent Labs  Lab 04/05/20 1820 04/06/20 0147 04/06/20 0415  LATICACIDVEN 6.2* 3.2* 2.8*    Recent Results (from the past 240 hour(s))  Urine Culture     Status: None   Collection Time: 04/05/20  5:36 PM   Specimen: Urine, Random  Result Value Ref Range Status   Specimen Description URINE, RANDOM  Final   Special Requests NONE  Final   Culture   Final    NO GROWTH Performed at [facility], [place] [address] [place], [place] [address]    Report Status 04/06/2020 FINAL  Final  Culture, blood (Routine X 2) w Reflex to ID Panel     Status: None (Preliminary result)   Collection Time: 04/05/20  6:20 PM   Specimen: BLOOD  Result Value Ref Range Status   Specimen Description BLOOD LEFT ANTECUBITAL  Final   Special Requests   Final    BOTTLES DRAWN AEROBIC AND ANAEROBIC Blood Culture results may not be optimal due to an inadequate volume of blood received in culture bottles   Culture   Final    NO GROWTH 3 DAYS Performed at [facility], [place] [address] [place], [place] [address]    Report Status PENDING  Incomplete  Culture, blood (Routine X 2) w Reflex to ID Panel     Status: None (Preliminary result)    Collection Time: 04/05/20  6:47 PM   Specimen: BLOOD LEFT HAND  Result Value Ref Range Status   Specimen Description BLOOD LEFT HAND  Final   Special Requests   Final    BOTTLES DRAWN AEROBIC AND ANAEROBIC Blood Culture results may not be optimal due to an inadequate volume of blood received in culture bottles   Culture   Final    NO GROWTH 3 DAYS Performed at [facility], [place] [address], [place], [place] [address]    Report Status PENDING  Incomplete  Respiratory Panel by RT PCR (Flu A&B, Covid) - Nasopharyngeal Swab     Status: None   Collection Time: 04/05/20  6:49 PM   Specimen: Nasopharyngeal Swab  Result Value Ref Range Status   SARS Coronavirus 2 by RT PCR NEGATIVE NEGATIVE Final    Comment: (NOTE) SARS-CoV-2 target nucleic acids are NOT DETECTED.  The SARS-CoV-2 RNA is generally detectable in upper respiratoy specimens during the acute phase of infection. The lowest concentration of SARS-CoV-2 viral copies this assay can detect is 131 copies/mL. A negative result does not preclude SARS-Cov-2 infection and should not be used as the sole basis for treatment or other patient management decisions. A negative result may occur with  improper specimen collection/handling, submission of specimen other than nasopharyngeal swab, presence of viral mutation(s) within the areas targeted by this assay, and inadequate number of viral copies (<131 copies/mL). A negative result must be combined with clinical observations, patient history, and epidemiological information. The expected result is Negative.  Fact Sheet for Patients:  PinkCheek.be  Fact Sheet for Healthcare Providers:  GravelBags.it  This test is no t yet approved or cleared by the [place] FDA and  has been authorized for detection and/or diagnosis of SARS-CoV-2 by FDA under an Emergency Use Authorization (EUA). This EUA will remain  in effect (meaning  this test can be used) for the duration of the COVID-19 declaration under Section 564(b)(1) of the Act, 21 U.S.C. section 360bbb-3(b)(1), unless the authorization is terminated or revoked sooner.     Influenza A by PCR NEGATIVE NEGATIVE Final   Influenza B by PCR NEGATIVE NEGATIVE Final    Comment: (NOTE) The Xpert Xpress SARS-CoV-2/FLU/RSV assay is intended as an aid in  the diagnosis of influenza from Nasopharyngeal swab specimens and  should not be used as a sole basis for treatment. Nasal washings and  aspirates are unacceptable for Xpert Xpress SARS-CoV-2/FLU/RSV  testing.  Fact Sheet for Patients: PinkCheek.be  Fact Sheet for Healthcare Providers: GravelBags.it  This test is not yet approved or cleared by the [place] FDA and  has been authorized for detection and/or diagnosis of SARS-CoV-2 by  FDA under an Emergency Use Authorization (EUA). This EUA will remain  in effect (meaning this test can be used) for the duration of the  Covid-19 declaration under Section 564(b)(1) of the Act, 21  U.S.C. section 360bbb-3(b)(1), unless the authorization is  terminated or revoked. Performed at [facility], [place] [address], [place], [place] [address]          Radiology Studies: No results found.      Scheduled Meds: . apixaban  5 mg Oral BID  . insulin aspart  0-15 Units Subcutaneous TID WC  . insulin detemir  15 Units Subcutaneous BID  . levETIRAcetam  500 mg Oral BID  . metoprolol succinate  25 mg Oral Daily   Continuous Infusions: . ceFEPime (MAXIPIME) IV 2 g (04/07/20 2028)          Aline August, MD [facility] 04/08/2020, 1:44 PM

## 2020-04-08 NOTE — Progress Notes (Signed)
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  Patient was admitted following a fall and closed right distal femoral fracture.  Other findings on admission includes UTI, AKI, hyperkalemia, bilateral lower extremity edema, grade 1 diastolic dysfunction, hypertension, prolonged QT interval and abnormal liver function test.  There is documentation of sepsis in the H&P.  Orthopedic input is appreciated, conservative management is advised.  Patient is currently on IV cefepime, while cultures are pending.  Acute kidney injury is resolving.  Serum creatinine is down to 1.15.  Potassium is down to 3.9.  Patient was seen alongside patient's son.   Clinical Impression   Son present during session. PTA pt lived with her son who is her primary caregiver. Son transfers pt to wc/BSC/bed - son states pt pushes up and assists with transfers, but overall Max A. Family primarily completes ADL tasks for pt, however pt feeds herself. Pt with increased pain during any attempts at movement, saying "no no no". Pt at high risk for developing B ankle contractures and would benefit from B prevalon boots; pain is a significant issues and interfering with any attempts at mobility - Discussed with nursing. Educated son on bed level exercises to complete with his mom at bed level. Pt will need rehab at SNF. Will follow acutely     Follow Up Recommendations  SNF;Supervision/Assistance - 24 hour    Equipment Recommendations  None recommended by OT     Recommendations for Other Services Other (comment) (Palliative Consult - goals of care and pain management)     Precautions / Restrictions Precautions Precautions: Fall Precaution Comments: at risk for contractures; skin breakdown Required Braces or Orthoses: Knee Immobilizer - Right Knee Immobilizer - Right: On at all times Restrictions Weight Bearing Restrictions: Yes RLE Weight Bearing: Non weight bearing      Mobility Bed Mobility    Pt would not tolerate any bed mobility and resisting movement              Transfers                 General transfer comment: Will need Maximove    Balance Overall balance assessment: History of Falls                                         ADL either performed or assessed with clinical judgement   ADL Overall ADL's : Needs assistance/impaired Eating/Feeding: Minimal assistance;Sitting;Bed level Eating/Feeding Details (indicate cue type and reason): Able to hold cup and bring to mouthwhen son not in room Grooming: Moderate assistance;Bed level   Upper Body Bathing: Moderate assistance;Bed level   Lower Body Bathing: Total assistance   Upper Body Dressing : Maximal assistance   Lower Body Dressing: Total assistance                       Vision         Perception     Praxis      Pertinent Vitals/Pain Pain Assessment: Faces Faces Pain  Scale: Hurts whole lot Pain Location: with any attempted movement, especially RLE Pain Descriptors / Indicators: Grimacing;Guarding;Moaning Pain Intervention(s): Limited activity within patient's tolerance;Repositioned     Hand Dominance Right   Extremity/Trunk Assessment Upper Extremity Assessment Upper Extremity Assessment: RUE deficits/detail RUE Deficits / Details: R hand contracture and limited shoulder/elbow ROM at baseline; has splint but not in hsopital; washcloth in hand; skin between fingers macerated (non-functional)   Lower Extremity  Assessment Lower Extremity Assessment: RLE deficits/detail;LLE deficits/detail RLE Deficits / Details: R KI; holding foot in plantarflexion; able to achieve close to neutral after gentel stretching; increased to develop footdrop LLE Deficits / Details: Increased risk to develop footdrop; limited ROM but able to move with assistance   Cervical / Trunk Assessment Cervical / Trunk Assessment: Other exceptions (R bias due to CVA)   Communication Communication Communication: Expressive difficulties   Cognition Arousal/Alertness: Awake/alert Behavior During Therapy: Flat affect;Anxious Overall Cognitive Status: History of cognitive impairments - at baseline                                 General Comments: son states pt is at baseline cognitively   General Comments       Exercises Exercises: Other exercises Other Exercises Other Exercises: son educated on need to complete heel cord stretches B Other Exercises: son educated to cmoplete general LLE ROM and LUE ROM Other Exercises: Encouraged pt to reach across midline to assist with rolling Other Exercises: Educated son on importance of keeping B heels elevated   Shoulder Instructions      Home Living Family/patient expects to be discharged to:: Skilled nursing facility                                        Prior Functioning/Environment Level of Independence: Needs assistance  Gait / Transfers Assistance Needed: pt was requiring assist to transfer from recliner<>w/c and w/c<>toilet; pt states she was getting sponge baths prior to admission; granddaughter is assisting with all transfers and ADLS, pt states she was able to help some     Comments: son states pt could stand holding onto bar while they bathed her        OT Problem List: Decreased strength;Decreased range of motion;Decreased activity tolerance;Impaired balance (sitting and/or standing);Decreased coordination;Decreased cognition;Decreased  safety awareness;Decreased knowledge of use of DME or AE;Cardiopulmonary status limiting activity;Impaired sensation;Impaired tone;Obesity;Impaired UE functional use;Pain;Increased edema      OT Treatment/Interventions: Self-care/ADL training;Therapeutic exercise;Neuromuscular education;DME and/or AE instruction;Therapeutic activities;Splinting;Cognitive remediation/compensation;Patient/family education;Balance training    OT Goals(Current goals can be found in the care plan section) Acute Rehab OT Goals Patient Stated Goal: per son for pt to get back to her prior level of function OT Goal Formulation: With patient/family Time For Goal Achievement: 04/22/20 Potential to Achieve Goals: Fair  OT Frequency: Min 2X/week   Barriers to D/C:            Co-evaluation              AM-PAC OT "6 Clicks" Daily Activity     Outcome Measure Help from another person eating meals?: A Little Help from another person taking care of personal grooming?: A Little Help from another person toileting, which includes using toliet, bedpan, or urinal?: Total Help from another person bathing (including washing, rinsing, drying)?: A Lot Help from another person to put  on and taking off regular upper body clothing?: A Lot Help from another person to put on and taking off regular lower body clothing?: Total 6 Click Score: 12   End of Session Equipment Utilized During Treatment: Right knee immobilizer Nurse Communication: Mobility status;Patient requests pain meds;Precautions;Weight bearing status;Need for lift equipment (Maximove)  Activity Tolerance: Patient limited by pain Patient left: in bed;with call bell/phone within reach;with bed alarm set;with family/visitor present  OT Visit Diagnosis: Unsteadiness on feet (R26.81);Other abnormalities of gait and mobility (R26.89);Muscle weakness (generalized) (M62.81);History of falling (Z91.81);Other symptoms and signs involving cognitive  function;Pain;Hemiplegia and hemiparesis Hemiplegia - Right/Left: Right Hemiplegia - caused by: Cerebral infarction (previous CVA) Pain - Right/Left: Right Pain - part of body: Leg                Time: 0349-1791 OT Time Calculation (min): 27 min Charges:  OT General Charges $OT Visit: 1 Visit OT Evaluation $OT Eval Moderate Complexity: 1 Mod OT Treatments $Therapeutic Activity: 8-22 mins  Maurie Boettcher, OT/L   Acute OT Clinical Specialist [place] Pager [phone] Office [phone]   [facility] 04/08/2020, 4:36 PM

## 2020-04-08 NOTE — Progress Notes (Signed)
Inpatient Diabetes Program Recommendations  AACE/ADA: New Consensus Statement on Inpatient Glycemic Control (2015)  Target Ranges:  Prepandial:   less than 140 mg/dL      Peak postprandial:   less than 180 mg/dL (1-2 hours)      Critically ill patients:  140 - 180 mg/dL   Lab Results  Component Value Date   GLUCAP 265 (H) 04/08/2020   HGBA1C 12.0 (H) 03/20/2020    Review of Glycemic Control Results for [name], [name] (MRN [id number]) as of 04/08/2020 11:44  Ref. Range 04/08/2020 06:30 04/08/2020 06:56 04/08/2020 08:12 04/08/2020 11:09  Glucose-Capillary Latest Ref Range: 70 - 99 mg/dL 51 (L) 56 (L) 128 (H) 265 (H)   Diabetes history:  DM2 Outpatient Diabetes medications:  Levemir 25 units bid Current orders for Inpatient glycemic control:  Levemir 25 units bid Novolog 0-15 units tid    Inpatient Diabetes Program Recommendations:     Levemir 15 units bid Novolog 2-3 units tid with meals   Will continue to follow while inpatient.  Thank you, Reche Dixon, RN, BSN Diabetes Coordinator Inpatient Diabetes Program [phone] (team pager from 8a-5p)

## 2020-04-08 NOTE — Progress Notes (Addendum)
Hypoglycemic Event  CBG: 66 @  2209, 68 @ 2234  Treatment: about 120 mL OJ, then 15g glucose gel  Symptoms: none  Follow-up CBG: Time: 2250 CBG Result: 96  Possible Reasons for Event: Levemir? I did not give nighttime dose but she did receive 25 units at daytime.  Comments/MD notified: [place]

## 2020-04-08 NOTE — Progress Notes (Signed)
Patient IV malpositioned and painful. Patient refuses to get new IV access.  MD aware and will switch IV abx to oral.  Son at bedside attempting to talk mom into getting new IV.  Patient is not agreeable at this time. Pt resting with call bell within reach.  Will continue to monitor. Payton Emerald, RN

## 2020-04-08 NOTE — Progress Notes (Signed)
Patient was hypoglycemic at shift change.  Patient given OJ and peanut butter w/crackers and a banana.  Patient rechecked at 0812 cbg=128.  Patient son in room and discussed insulin use at home.  Son states that he usually checks her later in morning and gives 25 units BID per MD if her sugar is greater than 250.  Son stated they wanted her to stay between 120 and 160. Spoke with MD and held morning lantus dose till lunchtime when cbg=265. Did not give SSI and MD aware.

## 2020-04-09 ENCOUNTER — Encounter (HOSPITAL_COMMUNITY): Payer: Self-pay | Admitting: Internal Medicine

## 2020-04-09 DIAGNOSIS — Z515 Encounter for palliative care: Secondary | ICD-10-CM

## 2020-04-09 DIAGNOSIS — Z7189 Other specified counseling: Secondary | ICD-10-CM

## 2020-04-09 DIAGNOSIS — N179 Acute kidney failure, unspecified: Secondary | ICD-10-CM | POA: Diagnosis not present

## 2020-04-09 DIAGNOSIS — I10 Essential (primary) hypertension: Secondary | ICD-10-CM

## 2020-04-09 DIAGNOSIS — A419 Sepsis, unspecified organism: Secondary | ICD-10-CM | POA: Diagnosis not present

## 2020-04-09 DIAGNOSIS — S72401A Unspecified fracture of lower end of right femur, initial encounter for closed fracture: Secondary | ICD-10-CM | POA: Diagnosis not present

## 2020-04-09 LAB — BASIC METABOLIC PANEL
Anion gap: 10 (ref 5–15)
BUN: 6 mg/dL — ABNORMAL LOW (ref 8–23)
CO2: 24 mmol/L (ref 22–32)
Calcium: 8.3 mg/dL — ABNORMAL LOW (ref 8.9–10.3)
Chloride: 108 mmol/L (ref 98–111)
Creatinine, Ser: 0.72 mg/dL (ref 0.44–1.00)
GFR calc non Af Amer: >60 mL/min (ref >60)
Glucose, Bld: 59 mg/dL — ABNORMAL LOW (ref 70–99)
Potassium: 3.2 mmol/L — ABNORMAL LOW (ref 3.5–5.1)
Sodium: 142 mmol/L (ref 135–145)

## 2020-04-09 LAB — CBC WITH DIFFERENTIAL/PLATELET
Abs Immature Granulocytes: 0.05 10*3/uL (ref 0.00–0.07)
Basophils Absolute: 0.1 10*3/uL (ref 0.0–0.1)
Basophils Relative: 1 %
Eosinophils Absolute: 0.5 10*3/uL (ref 0.0–0.5)
Eosinophils Relative: 5 %
HCT: 28.7 % — ABNORMAL LOW (ref 36.0–46.0)
Hemoglobin: 9.1 g/dL — ABNORMAL LOW (ref 12.0–15.0)
Immature Granulocytes: 1 %
Lymphocytes Relative: 27 %
Lymphs Abs: 2.7 10*3/uL (ref 0.7–4.0)
MCH: 28.2 pg (ref 26.0–34.0)
MCHC: 31.7 g/dL (ref 30.0–36.0)
MCV: 88.9 fL (ref 80.0–100.0)
Monocytes Absolute: 0.9 10*3/uL (ref 0.1–1.0)
Monocytes Relative: 9 %
Neutro Abs: 6 10*3/uL (ref 1.7–7.7)
Neutrophils Relative %: 57 %
Platelets: 164 10*3/uL (ref 150–400)
RBC: 3.23 MIL/uL — ABNORMAL LOW (ref 3.87–5.11)
RDW: 13.2 % (ref 11.5–15.5)
WBC: 10.2 10*3/uL (ref 4.0–10.5)
nRBC: 0.3 % — ABNORMAL HIGH (ref 0.0–0.2)

## 2020-04-09 LAB — MAGNESIUM: Magnesium: 1.8 mg/dL (ref 1.7–2.4)

## 2020-04-09 LAB — GLUCOSE, CAPILLARY
Glucose-Capillary: 73 mg/dL (ref 70–99)
Glucose-Capillary: 123 mg/dL — ABNORMAL HIGH (ref 70–99)
Glucose-Capillary: 208 mg/dL — ABNORMAL HIGH (ref 70–99)
Glucose-Capillary: 150 mg/dL — ABNORMAL HIGH (ref 70–99)

## 2020-04-09 LAB — SARS CORONAVIRUS 2 BY RT PCR (HOSPITAL ORDER, PERFORMED IN ~~LOC~~ HOSPITAL LAB): SARS Coronavirus 2: NEGATIVE

## 2020-04-09 MED ORDER — POTASSIUM CHLORIDE CRYS ER 20 MEQ PO TBCR
40.0000 meq | EXTENDED_RELEASE_TABLET | ORAL | Status: AC
  Administered 2020-04-09 (×2): 40 meq via ORAL
  Filled 2020-04-09 (×2): qty 2

## 2020-04-09 NOTE — Progress Notes (Signed)
Pt and family educated regarding contact precautions

## 2020-04-09 NOTE — TOC Progression Note (Signed)
Transition of Care [facility]) - Progression Note    Patient Details  Name: [name] MRN: [id number] Date of Birth: [birth year]  Transition of [facility]) CM/SW [place], [place] Phone Number: 04/09/2020, 10:19 AM  Clinical Narrative:     CSW spoke with patient's son,Terry. CSW advised, received bed offers and will leave medicare.gov SNF listing in the patient's room. CSW requested he review and provide CSW with top two choices.  CSW will continue to follow and assist with discharge planning.  Thurmond Butts, MSW, [place] Clinical Social Worker    Expected Discharge Plan: Skilled Nursing Facility Barriers to Discharge: Continued Medical Work up  Expected Discharge Plan and Services Expected Discharge Plan: [place] In-house Referral: Clinical Social Work     Living arrangements for the past 2 months: Single Family Home                                       Social Determinants of Health (SDOH) Interventions    Readmission Risk Interventions No flowsheet data found.

## 2020-04-09 NOTE — TOC Initial Note (Addendum)
Transition of Care [facility]) - Initial/Assessment Note    Patient Details  Name: [name] MRN: [id number] Date of Birth: 08/19/1945  Transition of Care [facility]) CM/SW Contact:    Vinie Sill, [place] Phone Number: 04/09/2020, 9:55 AM  Clinical Narrative:                  CSW met with patient at bedside along with her son,Terry. CSW introduced self and explained role.  Patient's son, [name] states patient  has been in the home with him for the past 5 years. CSW discussed PT recommendation of short term rehab at [facility]. [name], states he would like for the patient to get stronger before discharged home. CSW explained the SNF process. CSW was given permission to send SNF referrals. Patient has received covid vaccines.  CSW will provide son with bed offers once available. CSW will continue to follow and assist with discharge planning.  Thurmond Butts, MSW, [place] Clinical Social Worker   Expected Discharge Plan: Skilled Nursing Facility Barriers to Discharge: Continued Medical Work up   Patient Goals and CMS Choice        Expected Discharge Plan and Services Expected Discharge Plan: [place] In-house Referral: Clinical Social Work     Living arrangements for the past 2 months: Single Family Home                                      Prior Living Arrangements/Services Living arrangements for the past 2 months: Single Family Home Lives with:: Self, Adult Children Patient language and need for interpreter reviewed:: No        Need for Family Participation in Patient Care: Yes (Comment) Care giver support system in place?: Yes (comment)   Criminal Activity/Legal Involvement Pertinent to Current Situation/Hospitalization: No - Comment as needed  Activities of Daily Living      Permission Sought/Granted Permission sought to share information with : Family Supports Permission granted to share information with : Yes, Verbal Permission Granted  Share  Information with NAME: [name]  Permission granted to share info w AGENCY: SNFs  Permission granted to share info w Relationship: son  Permission granted to share info w Contact Information: [phone]  Emotional Assessment Appearance:: Appears stated age Attitude/Demeanor/Rapport: Other (comment) Affect (typically observed): Frustrated Orientation: : Oriented to Self, Oriented to Place Alcohol / Substance Use: Not Applicable Psych Involvement: No (comment)  Admission diagnosis:  Lactic acidosis [E87.2] Sepsis secondary to UTI ([place]) [A41.9, N39.0] Urinary tract infection with hematuria, site unspecified [N39.0, R31.9] Sepsis without acute organ dysfunction, due to unspecified organism [facility]) [A41.9] Patient Active Problem List   Diagnosis Date Noted  . Sepsis secondary to UTI ([place]) 04/05/2020  . AKI (acute kidney injury) ([place]) 04/05/2020  . Hyperkalemia 04/05/2020  . Abnormal LFTs 04/05/2020  . Mild protein malnutrition ([place]) 04/05/2020  . Grade I diastolic dysfunction 94/85/4627  . Closed fracture of right distal femur ([place]) 04/05/2020  . Prolonged QT interval 04/05/2020  . Dysuria 06/12/2019  . Preventative health care 08/18/2018  . Partial symptomatic epilepsy with complex partial seizures, not intractable, without status epilepticus ([place]) 12/08/2017  . Shingles 10/26/2017  . Constipation 04/20/2017  . Skin ulcer of right heel, limited to breakdown of skin ([place]) 02/10/2017  . Muscle spasticity 01/20/2016  . Bilateral lower extremity edema 12/23/2015  . Acute confusional state 10/28/2015  . Right spastic hemiparesis ([place]) 10/07/2015  .  Right shoulder pain 07/24/2015  . Fracture, intertrochanteric, right femur ([place]) 06/10/2015  . Stercoral ulcer of rectum 05/16/2015  . GI bleed 05/13/2015  . BRBPR (bright red blood per rectum) 05/13/2015  . CHF (congestive heart failure) ([place])   . Diabetes ([place])   . Hypertension   . History of stroke   . Lower GI bleed   .  Left-sided cerebrovascular accident (CVA) ([place]) 04/28/2015   PCP:  Biagio Borg, MD Pharmacy:   [facility] [place] - [address] [place] [address] Phone: [phone] Fax: [phone]     Social Determinants of Health (SDOH) Interventions    Readmission Risk Interventions No flowsheet data found.

## 2020-04-09 NOTE — Progress Notes (Signed)
Patient ID: [name], female   DOB: 1945/11/09, 74 y.o.   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  Orthopedics recommended conservative management.  She was treated with IV antibiotics and fluids.  Assessment & Plan:  Closed right distal femur fracture status post fall -Orthopedics recommended conservative management with immobilization and outpatient follow-up with orthopedics in the office in 2 weeks -PT/OT recommend SNF placement.  Social worker following  Sepsis: Present on admission UTI -Cultures negative so far.  Initially treated with cefepime and switched to Keflex on 04/08/2020.  Finish 5-day course of therapy.   -Hemodynamically stable currently.  Sepsis has resolved.  Acute kidney injury -Resolved.  Creatinine 0.72 today.  Losartan and Lasix on hold for now.  Prolonged QT interval -Improved.  Hypertension -Blood pressure on the lower side.  Continue metoprolol.  Diabetes mellitus type 2 uncontrolled with hyperglycemia and hypoglycemia -Episodes of hypoglycemia again this morning.  DC Levemir for now.  Hypokalemia -Replace  History of right spastic hemiparesis following left-sided embolic CVA -Fall precautions.  Continue baclofen.  Continue Eliquis  Abnormal LFTs -Resolved.  Resume statin on discharge  Generalized conditioning -Overall prognosis is guarded to poor as patient is mostly bedridden with spastic right-sided hemiparesis and now has a  distal femur fracture.  She is at high risk for of decompensation and infections.  Spoke to son on 04/08/2020 who wants patient to be full code.  palliative care consultation for goals of care discussion has been requested.  DVT prophylaxis: Eliquis Code Status: Full Family Communication: Son/Terry on phone on 04/08/2020 Disposition Plan: Status is: Inpatient  Remains inpatient appropriate because:Inpatient level of care appropriate due to severity of illness.  SNF once bed is available   Dispo: The patient is from: Home              Anticipated d/c is to: SNF              Anticipated d/c date is: 1 day              Patient currently is medically stable to d/c.   Consultants: Orthopedics/Palliative care  Procedures: None  Antimicrobials:  Anti-infectives (From admission, onward)   Start     Dose/Rate Route Frequency Ordered Stop   04/08/20 1400  cephALEXin (KEFLEX) capsule 500 mg        500 mg Oral Every 8 hours 04/08/20 1355     04/06/20 0800  ceFEPIme (MAXIPIME) 2 g in sodium chloride 0.9 % 100 mL IVPB  Status:  Discontinued        2 g 200 mL/hr over 30 Minutes Intravenous Every 12 hours 04/05/20 1931 04/08/20 1355   04/05/20 1930  ceFEPIme (MAXIPIME) 2 g in sodium chloride 0.9 % 100 mL IVPB        2 g 200 mL/hr over 30 Minutes Intravenous  Once 04/05/20 1921 04/05/20 2210   04/05/20 1915  cefTRIAXone (ROCEPHIN) 1 g in sodium chloride 0.9 % 100 mL IVPB  Status:  Discontinued  1 g 200 mL/hr over 30 Minutes Intravenous  Once 04/05/20 1911 04/05/20 1921        Subjective: Patient seen and examined at bedside.  Extremely poor historian.  No overnight fever, vomiting reported.  Nursing staff reported episodes of hypoglycemia again this morning.  Wakes up slightly, still slightly confused.  Does not participate in conversation much.  Objective: Vitals:   04/08/20 1651 04/08/20 2019 04/08/20 2350 04/09/20 0525  BP:  (!) 102/58 (!) 96/57 107/63  Pulse: 86 90 86 91  Resp:  18 18 16 17   Temp: 99.5 F (37.5 C) 99.4 F (37.4 C) 100 F (37.8 C) 99.3 F (37.4 C)  TempSrc: Oral Oral Oral Oral  SpO2: 93% 93% 93% 93%  Weight:      Height:        Intake/Output Summary (Last 24 hours) at 04/09/2020 0727 Last data filed at 04/08/2020 2019 Gross per 24 hour  Intake 360 ml  Output 1550 ml  Net -1190 ml   Filed Weights   04/05/20 1728 04/07/20 0403 04/08/20 8786  Weight: 68.9 kg 82.4 kg 82.8 kg    Examination:  General exam: No distress.  Looks chronically ill. Wakes up slightly, still slightly confused.  Does not participate in conversation much Respiratory system: Bilateral decreased breath sounds at bases with some crackles, no wheezing  cardiovascular system: Rate controlled, S1-S2 heard Gastrointestinal system: Abdomen is nondistended, soft and nontender.  Bowel sounds are heard  extremities: Mild lower extremity edema present.  No clubbing   Data Reviewed: I have personally reviewed following labs and imaging studies  CBC: Recent Labs  Lab 04/05/20 1820 04/06/20 0416 04/07/20 0216 04/08/20 1050 04/09/20 0452  WBC 14.5* 11.8* 9.8 9.6 10.2  NEUTROABS 12.7* 7.2 6.5 6.1 6.0  HGB 12.5 10.2* 9.3* 9.4* 9.1*  HCT 41.4 33.4* 29.1* 30.6* 28.7*  MCV 91.2 89.8 89.8 89.5 88.9  PLT 200 178 152 139* 767   Basic Metabolic Panel: Recent Labs  Lab 04/05/20 1820 04/06/20 0416 04/06/20 1811 04/07/20 0216 04/08/20 1050 04/09/20 0452  NA 138 143  --  142 141 142  K 5.3* 3.9  --  3.4* 3.5 3.2*  CL 107 109  --  112* 105 108  CO2 18* 25  --  21* 23 24  GLUCOSE 308* 221*  --  149* 220* 59*  BUN 13 12  --  10 9 6*  CREATININE 1.37* 1.15*  --  0.96 0.89 0.72  CALCIUM 8.4* 8.5*  --  8.2* 8.4* 8.3*  MG  --   --  1.9  --  1.8 1.8  PHOS  --   --   --   --  3.2  --    GFR: Estimated Creatinine Clearance: 65.9 mL/min (by C-G formula based on SCr of 0.72 mg/dL). Liver Function Tests: Recent Labs  Lab 04/05/20 1820 04/06/20 0416 04/07/20 0216  04/08/20 1050  AST 68* 36 27  --   ALT 45* 35 23  --   ALKPHOS 74 60 47  --   BILITOT 1.6* 0.7 0.6  --   PROT 5.9* 5.2* 4.8*  --   ALBUMIN 3.2* 2.7* 2.5* 2.5*   No results for input(s): LIPASE, AMYLASE in the last 168 hours. Recent Labs  Lab 04/05/20 1820  AMMONIA 25   Coagulation Profile: Recent Labs  Lab 04/06/20 0416  INR 1.2   Cardiac Enzymes: Recent Labs  Lab 04/05/20 1820  CKTOTAL 233   BNP (last 3 results) No results for  input(s): PROBNP in the last 8760 hours. HbA1C: No results for input(s): HGBA1C in the last 72 hours. CBG: Recent Labs  Lab 04/08/20 1639 04/08/20 2209 04/08/20 2234 04/08/20 2257 04/09/20 0647  GLUCAP 138* 66* 68* 96 73   Lipid Profile: No results for input(s): CHOL, HDL, LDLCALC, TRIG, CHOLHDL, LDLDIRECT in the last 72 hours. Thyroid Function Tests: No results for input(s): TSH, T4TOTAL, FREET4, T3FREE, THYROIDAB in the last 72 hours. Anemia Panel: No results for input(s): VITAMINB12, FOLATE, FERRITIN, TIBC, IRON, RETICCTPCT in the last 72 hours. Sepsis Labs: Recent Labs  Lab 04/05/20 1820 04/06/20 0147 04/06/20 0415  LATICACIDVEN 6.2* 3.2* 2.8*    Recent Results (from the past 240 hour(s))  Urine Culture     Status: None   Collection Time: 04/05/20  5:36 PM   Specimen: Urine, Random  Result Value Ref Range Status   Specimen Description URINE, RANDOM  Final   Special Requests NONE  Final   Culture   Final    NO GROWTH Performed at [facility], [place] [address], [place], [place] [address]    Report Status 04/06/2020 FINAL  Final  Culture, blood (Routine X 2) w Reflex to ID Panel     Status: None (Preliminary result)   Collection Time: 04/05/20  6:20 PM   Specimen: BLOOD  Result Value Ref Range Status   Specimen Description BLOOD LEFT ANTECUBITAL  Final   Special Requests   Final    BOTTLES DRAWN AEROBIC AND ANAEROBIC Blood Culture results may not be optimal due to an inadequate volume of blood received in culture  bottles   Culture   Final    NO GROWTH 4 DAYS Performed at [facility], [place] [address] [place], [place] [address]    Report Status PENDING  Incomplete  Culture, blood (Routine X 2) w Reflex to ID Panel     Status: None (Preliminary result)   Collection Time: 04/05/20  6:47 PM   Specimen: BLOOD LEFT HAND  Result Value Ref Range Status   Specimen Description BLOOD LEFT HAND  Final   Special Requests   Final    BOTTLES DRAWN AEROBIC AND ANAEROBIC Blood Culture results may not be optimal due to an inadequate volume of blood received in culture bottles   Culture   Final    NO GROWTH 4 DAYS Performed at [facility], [place] [address] [place], [place] [address]    Report Status PENDING  Incomplete  Respiratory Panel by RT PCR (Flu A&B, Covid) - Nasopharyngeal Swab     Status: None   Collection Time: 04/05/20  6:49 PM   Specimen: Nasopharyngeal Swab  Result Value Ref Range Status   SARS Coronavirus 2 by RT PCR NEGATIVE NEGATIVE Final    Comment: (NOTE) SARS-CoV-2 target nucleic acids are NOT DETECTED.  The SARS-CoV-2 RNA is generally detectable in upper respiratoy specimens during the acute phase of infection. The lowest concentration of SARS-CoV-2 viral copies this assay can detect is 131 copies/mL. A negative result does not preclude SARS-Cov-2 infection and should not be used as the sole basis for treatment or other patient management decisions. A negative result may occur with  improper specimen collection/handling, submission of specimen other than nasopharyngeal swab, presence of viral mutation(s) within the areas targeted by this assay, and inadequate number of viral copies (<131 copies/mL). A negative result must be combined with clinical observations, patient history, and epidemiological information. The expected result is Negative.  Fact Sheet for Patients:  PinkCheek.be  Fact Sheet for Healthcare Providers:   GravelBags.it  This test is no t yet approved or cleared by the [place] FDA and  has been authorized for detection and/or diagnosis of SARS-CoV-2 by FDA under an Emergency Use Authorization (EUA). This EUA will remain  in effect (meaning this test can be used) for the duration of the COVID-19 declaration under Section 564(b)(1) of the Act, 21 U.S.C. section 360bbb-3(b)(1), unless the authorization is terminated or revoked sooner.     Influenza A by PCR NEGATIVE NEGATIVE Final   Influenza B by PCR NEGATIVE NEGATIVE Final    Comment: (NOTE) The Xpert Xpress SARS-CoV-2/FLU/RSV assay is intended as an aid in  the diagnosis of influenza from Nasopharyngeal swab specimens and  should not be used as a sole basis for treatment. Nasal washings and  aspirates are unacceptable for Xpert Xpress SARS-CoV-2/FLU/RSV  testing.  Fact Sheet for Patients: PinkCheek.be  Fact Sheet for Healthcare Providers: GravelBags.it  This test is not yet approved or cleared by the [place] FDA and  has been authorized for detection and/or diagnosis of SARS-CoV-2 by  FDA under an Emergency Use Authorization (EUA). This EUA will remain  in effect (meaning this test can be used) for the duration of the  Covid-19 declaration under Section 564(b)(1) of the Act, 21  U.S.C. section 360bbb-3(b)(1), unless the authorization is  terminated or revoked. Performed at [facility], [place] [address], [place],  [address]          Radiology Studies: No results found.      Scheduled Meds: . apixaban  5 mg Oral BID  . cephALEXin  500 mg Oral Q8H  . insulin aspart  0-15 Units Subcutaneous TID WC  . insulin detemir  15 Units Subcutaneous BID  . levETIRAcetam  500 mg Oral BID  . metoprolol succinate  25 mg Oral Daily   Continuous Infusions:         Aline August, MD [facility] 04/09/2020,  7:27 AM

## 2020-04-09 NOTE — TOC Progression Note (Addendum)
Transition of Care [facility]) - Progression Note    Patient Details  Name: [name] MRN: [id number] Date of Birth: Mar 11, 1946  Transition of Care [facility]) CM/SW [place], [place] Phone Number: 04/09/2020, 4:47 PM  Clinical Narrative:     Patient son,[name], has selected [employer].   [facility] [facility] has confirmed bed offer- CSW informed of anticipated discharge tomorrow if medically stable.    RN & MD updated- covid test requested  Thurmond Butts, MSW, [place] Clinical Social Worker    Expected Discharge Plan: Skilled Nursing Facility Barriers to Discharge: Continued Medical Work up  Expected Discharge Plan and Services Expected Discharge Plan: [place] In-house Referral: Clinical Social Work     Living arrangements for the past 2 months: Single Family Home                                       Social Determinants of Health (SDOH) Interventions    Readmission Risk Interventions No flowsheet data found.

## 2020-04-09 NOTE — Consult Note (Signed)
Consultation Note Date: 04/09/2020   Patient Name: [name]  DOB: 07/17/1945  MRN: [id number]  Age / Sex: 74 y.o., female  PCP: Biagio Borg, MD Referring Physician: Aline August, MD  Reason for Consultation: Establishing goals of care and Psychosocial/spiritual support  HPI/Patient Profile: 74 y.o. 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 Conservative management recommended by orthopedics. Treated with IV antibiotics and fluids.  Clinical Assessment and Goals of Care: I have reviewed medical records including EPIC notes, labs and imaging, received report from bedside nursing staff and transition of care team, examined the patient. [name] is lying quietly in bed. She is alert, and makes and somewhat keeps eye contact as I enter. She appears chronically ill and frail. She is oriented to person only at this time. I believe she is able to make her basic needs known. There is no family at bedside at this time.  Call to son, [name] to discuss diagnosis prognosis, [place], EOL wishes, disposition and options.  [name] states that he is talking with someone at this time and request to return phone call in 10 minutes.  I introduced Palliative Medicine as specialized medical care for people living with serious illness. It focuses on providing relief from the symptoms and stress of a serious illness.   We discussed a brief life review of the patient.  [name] has lived in son Terry's home since her 2016 stroke.  [name] is still married, but her husband has health issues.  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  She has 3 children, [name], [name], and Lenon Ahmadi.      As far as functional and nutritional status, [name] has been hemiplegic since her stroke, [name] tells me that he is his mother is at home caregiver, he has no outside help.  He shares that they have needed equipment to get her mobile.   [name] states that she can stand and balance some, but is not really pivoting for transfers.  [name] states that he realizes that she may be more bedbound after this injury, but is hopeful for some recovery.  He shares that he will accommodate her needs, and realizes that they may need more equipment after STR.   Terry labs that her weight and appetite are stable, she is eating and putting on weight.    We discussed her current illness and what it means in the larger context of her on-going co-morbidities.  Natural disease trajectory and expectations at EOL were discussed.  I attempted to elicit values and goals of care important to the patient.  I asked [name] if he and his mother have talked about her desires and wishes as she nears end-of-life.  I reassure him  that I do not think that she is dying, but we discussed the importance of hearing her voice.  [name] states that they have discussed end-of-life issues, and immediately states that they do not want DNR.  Advanced directives, concepts specific to code status, were considered and discussed.   We talk about life support, and the concept of "treat the treatable, but allowing natural death".  I asked [name] to consider how long would they would want life support, sharing that if she survives CPR, she will continue to have her current health concerns, plus more.  We talked about short-term rehab.  [name] states that he is going to [facility] today, he would like to see the facility before he chooses.  I encouraged him to work closely with Education officer, museum at whichever short-term facility they elect.  Hospice and  Palliative Care services outpatient were explained and offered.     Questions and concerns were addressed.  The family was encouraged to call with questions or concerns.   Conference with attending, bedside nursing staff, transition of care team related to patient condition, needs, goals of care.  HCPOA   HCPOA -son, [name].   [name] also has 2 other children, son [name] and daughter Lenon Ahmadi.      SUMMARY OF RECOMMENDATIONS   At this point full scope/full code Agreeable to short-term rehab with ultimate goal of returning to home Would benefit from outpatient palliative services   Code Status/Advance Care Planning:  Full code  Symptom Management:   Per hospitalist, no additional needs at this time.  Palliative Prophylaxis:   Frequent Pain Assessment and Turn Reposition  Additional Recommendations (Limitations, Scope, Preferences):  Full Scope Treatment  Psycho-social/Spiritual:   Desire for further Chaplaincy support:no  Additional Recommendations: Caregiving  Support/Resources and Education on Hospice  Prognosis:   Unable to determine, based on outcomes.  Guarded due to bedbound status, further immobility due to femur fracture.  Discharge Planning: Anticipate short-term rehab with a goal of returning home      Primary Diagnoses: Present on Admission: . Sepsis secondary to UTI ([place]) . Hypertension . Right spastic hemiparesis ([place]) . AKI (acute kidney injury) ([place]) . Hyperkalemia . Abnormal LFTs . Mild protein malnutrition ([place]) . Closed fracture of right distal femur ([place]) . Bilateral lower extremity edema . Prolonged QT interval   I have reviewed the medical record, interviewed the patient and family, and examined the patient. The following aspects are pertinent.  Past Medical History:  Diagnosis Date  . Acute blood loss anemia   . CHF (congestive heart failure) (HCC)    EF 75-10%, grade 1 diastolic dysfunction per echo 04/2015    . 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 CVA (cerebral infarction) 04/2015   Started Plavix 04/2015  . Depression   . Diabetes ([place]) 2016   Type II. On insulin  . Enchondroma of bone 2011   left femur.   . Fracture, intertrochanteric, right femur ([place])   . History of lower GI bleeding   . Hyperlipidemia   . Hypertension   . Patent foramen ovale 04/2015   Started on warfarin 04/2015  . Protein calorie malnutrition ([place])   . Stercoral ulcer of rectum 05/16/2015   Social History   Socioeconomic History  . Marital status: Married    Spouse name: Not on file  . Number of children: 6  . Years of education: 20  . Highest education level: Not on file  Occupational History  . Occupation: Retired  Tobacco Use  . Smoking status: Former Smoker    Types: Cigarettes    Quit date: 04/05/2015    Years since quitting: 5.0  . Smokeless tobacco: Never Used  . Tobacco comment: Quit 04/28/15  Vaping Use  . Vaping Use: Never used  Substance and Sexual Activity  . Alcohol use: No    Alcohol/week: 0.0 standard drinks  . Drug use: No  . Sexual activity: Not on file  Other Topics Concern  . Not on file  Social History Narrative   She will be living with her two sons.\   Right-handed.   No caffeine use.   Social Determinants of Health   Financial Resource Strain:   . Difficulty of Paying Living Expenses: Not on file  Food Insecurity:   . Worried About Charity fundraiser in the Last Year: Not on file  . Ran Out of Food in the Last Year: Not on file  Transportation Needs:   . Lack of Transportation (Medical): Not on file  . Lack of Transportation (Non-Medical): Not on file  Physical Activity:   . Days of Exercise per Week: Not on file  . Minutes of Exercise per Session: Not on file  Stress:   . Feeling of Stress : Not on file  Social Connections:   . Frequency of Communication with Friends and Family: Not on file  .  Frequency of Social Gatherings with Friends and Family: Not on file  . Attends Religious Services: Not on file  . Active Member of Clubs or Organizations: Not on file  . Attends Archivist Meetings: Not on file  . Marital Status: Not on file   Family History  Problem Relation Age of Onset  . Hypertension Mother   . Heart failure Mother   . Hyperlipidemia Mother   . Heart attack Father    Scheduled Meds: . apixaban  5 mg Oral BID  . cephALEXin  500 mg Oral Q8H  . insulin aspart  0-15 Units Subcutaneous TID WC  . levETIRAcetam  500 mg Oral BID  . metoprolol succinate  25 mg Oral Daily  . potassium chloride  40 mEq Oral Q4H   Continuous Infusions: PRN Meds:.acetaminophen, baclofen, prochlorperazine Medications Prior to Admission:  Prior to Admission medications   Medication Sig Start Date End Date Taking? Authorizing Provider  atorvastatin (LIPITOR) 40 MG tablet Take 1 tablet (40 mg total) by mouth daily. Patient taking differently: Take 20 mg by mouth daily.  05/06/15  Yes Charolette Forward, MD  ELIQUIS 5 MG TABS tablet Take 5 mg by mouth 2 (two) times daily.  12/30/19  Yes [provider]  insulin detemir (LEVEMIR) 100 UNIT/ML injection INJECT 50 UNITS SUBCUTANEOUSLY ONCE DAILY Patient taking differently: Inject 25 Units into the skin 2 (two) times daily. Take With Meals 08/29/19  Yes Biagio Borg, MD  levETIRAcetam (KEPPRA) 500 MG tablet Take 1 tablet (500 mg total) by mouth 2 (two) times daily. 03/17/20  Yes Marcial Pacas, MD  losartan (COZAAR) 25 MG tablet Take 12.5 mg by mouth daily.  12/30/19  Yes [provider]  metoprolol succinate (TOPROL-XL) 25 MG 24 hr tablet Take 25 mg by mouth daily.  12/30/19  Yes [provider]  baclofen (LIORESAL) 10 MG tablet Take 1 tablet (10 mg total) by mouth 3 (three) times daily. Patient not taking: Reported on 04/05/2020 02/06/19   Marcial Pacas, MD  furosemide (LASIX) 20 MG tablet Take 20 mg  by mouth daily. 03/31/20    [provider]  gabapentin (NEURONTIN) 300 MG capsule Take 1 capsule (300 mg total) by mouth 3 (three) times daily. Patient not taking: Reported on 04/05/2020 02/06/19   Marcial Pacas, MD   Allergies  Allergen Reactions  . Pork-Derived Products Other (See Comments)    To keep blood pressure down   Review of Systems  Unable to perform ROS: Mental status change    Physical Exam Vitals and nursing note reviewed.  Constitutional:      General: She is not in acute distress.    Appearance: She is obese. She is ill-appearing.  HENT:     Head: Atraumatic.  Cardiovascular:     Rate and Rhythm: Normal rate.  Pulmonary:     Effort: Pulmonary effort is normal. No respiratory distress.  Abdominal:     Palpations: Abdomen is soft.  Musculoskeletal:        General: Swelling present.     Right lower leg: Edema present.     Left lower leg: Edema present.  Skin:    General: Skin is warm.  Neurological:     Mental Status: She is alert.     Comments: Oriented to self only at this time  Psychiatric:     Comments: Calm and cooperative, not fearful     Vital Signs: BP 117/72 (BP Location: Left Arm)   Pulse 92   Temp 99.6 F (37.6 C) (Oral)   Resp 17   Ht 5\' 6"  (1.676 m)   Wt 82.8 kg   SpO2 97%   BMI 29.46 kg/m  Pain Scale: 0-10   Pain Score: 0-No pain   SpO2: SpO2: 97 % O2 Device:SpO2: 97 % O2 Flow Rate: .   IO: Intake/output summary:   Intake/Output Summary (Last 24 hours) at 04/09/2020 1142 Last data filed at 04/09/2020 0900 Gross per 24 hour  Intake 240 ml  Output 1050 ml  Net -810 ml    LBM: Last BM Date: 04/04/20 Baseline Weight: Weight: 68.9 kg Most recent weight: Weight: 82.8 kg     Palliative Assessment/Data:   Flowsheet Rows     Most Recent Value  Intake Tab  Referral Department Hospitalist  Unit at Time of Referral Med/Surg Unit  Palliative Care Primary Diagnosis Trauma  Date Notified 04/08/20  Palliative Care Type New Palliative care  Reason  for referral Clarify Goals of Care  Date of Admission 04/05/20  Date first seen by Palliative Care 04/09/20  # of days Palliative referral response time 1 Day(s)  # of days IP prior to Palliative referral 3  Clinical Assessment  Palliative Performance Scale Score 20%  Pain Max last 24 hours Not able to report  Pain Min Last 24 hours Not able to report  Dyspnea Max Last 24 Hours Not able to report  Dyspnea Min Last 24 hours Not able to report  Psychosocial & Spiritual Assessment  Palliative Care Outcomes      Time In: 1030 Time Out: 1140 Time Total: 70 minutes  Greater than 50%  of this time was spent counseling and coordinating care related to the above assessment and plan.  Signed by: Drue Novel, NP   Please contact Palliative Medicine Team phone at [phone] for questions and concerns.  For individual provider: See Shea Evans

## 2020-04-10 ENCOUNTER — Other Ambulatory Visit: Payer: Self-pay | Admitting: Internal Medicine

## 2020-04-10 ENCOUNTER — Encounter: Payer: Self-pay | Admitting: Internal Medicine

## 2020-04-10 DIAGNOSIS — Z794 Long term (current) use of insulin: Secondary | ICD-10-CM | POA: Diagnosis not present

## 2020-04-10 DIAGNOSIS — M6281 Muscle weakness (generalized): Secondary | ICD-10-CM | POA: Diagnosis not present

## 2020-04-10 DIAGNOSIS — R748 Abnormal levels of other serum enzymes: Secondary | ICD-10-CM | POA: Diagnosis not present

## 2020-04-10 DIAGNOSIS — G40909 Epilepsy, unspecified, not intractable, without status epilepticus: Secondary | ICD-10-CM | POA: Diagnosis not present

## 2020-04-10 DIAGNOSIS — G8111 Spastic hemiplegia affecting right dominant side: Secondary | ICD-10-CM | POA: Diagnosis not present

## 2020-04-10 DIAGNOSIS — U071 COVID-19: Secondary | ICD-10-CM | POA: Diagnosis not present

## 2020-04-10 DIAGNOSIS — R41841 Cognitive communication deficit: Secondary | ICD-10-CM | POA: Diagnosis not present

## 2020-04-10 DIAGNOSIS — S72144S Nondisplaced intertrochanteric fracture of right femur, sequela: Secondary | ICD-10-CM | POA: Diagnosis not present

## 2020-04-10 DIAGNOSIS — I110 Hypertensive heart disease with heart failure: Secondary | ICD-10-CM | POA: Diagnosis not present

## 2020-04-10 DIAGNOSIS — I4510 Unspecified right bundle-branch block: Secondary | ICD-10-CM | POA: Diagnosis not present

## 2020-04-10 DIAGNOSIS — Z23 Encounter for immunization: Secondary | ICD-10-CM | POA: Diagnosis not present

## 2020-04-10 DIAGNOSIS — R945 Abnormal results of liver function studies: Secondary | ICD-10-CM | POA: Diagnosis not present

## 2020-04-10 DIAGNOSIS — N179 Acute kidney failure, unspecified: Secondary | ICD-10-CM | POA: Diagnosis not present

## 2020-04-10 DIAGNOSIS — A419 Sepsis, unspecified organism: Secondary | ICD-10-CM | POA: Diagnosis not present

## 2020-04-10 DIAGNOSIS — R2689 Other abnormalities of gait and mobility: Secondary | ICD-10-CM | POA: Diagnosis not present

## 2020-04-10 DIAGNOSIS — G8190 Hemiplegia, unspecified affecting unspecified side: Secondary | ICD-10-CM | POA: Diagnosis not present

## 2020-04-10 DIAGNOSIS — R269 Unspecified abnormalities of gait and mobility: Secondary | ICD-10-CM | POA: Diagnosis not present

## 2020-04-10 DIAGNOSIS — J80 Acute respiratory distress syndrome: Secondary | ICD-10-CM | POA: Diagnosis not present

## 2020-04-10 DIAGNOSIS — Z5321 Procedure and treatment not carried out due to patient leaving prior to being seen by health care provider: Secondary | ICD-10-CM | POA: Diagnosis not present

## 2020-04-10 DIAGNOSIS — I6789 Other cerebrovascular disease: Secondary | ICD-10-CM | POA: Diagnosis not present

## 2020-04-10 DIAGNOSIS — I509 Heart failure, unspecified: Secondary | ICD-10-CM | POA: Diagnosis not present

## 2020-04-10 DIAGNOSIS — I69359 Hemiplegia and hemiparesis following cerebral infarction affecting unspecified side: Secondary | ICD-10-CM | POA: Diagnosis not present

## 2020-04-10 DIAGNOSIS — I10 Essential (primary) hypertension: Secondary | ICD-10-CM | POA: Diagnosis not present

## 2020-04-10 DIAGNOSIS — T148XXA Other injury of unspecified body region, initial encounter: Secondary | ICD-10-CM | POA: Diagnosis not present

## 2020-04-10 DIAGNOSIS — R532 Functional quadriplegia: Secondary | ICD-10-CM | POA: Diagnosis not present

## 2020-04-10 DIAGNOSIS — L89890 Pressure ulcer of other site, unstageable: Secondary | ICD-10-CM | POA: Diagnosis not present

## 2020-04-10 DIAGNOSIS — R52 Pain, unspecified: Secondary | ICD-10-CM | POA: Diagnosis not present

## 2020-04-10 DIAGNOSIS — R0902 Hypoxemia: Secondary | ICD-10-CM | POA: Diagnosis not present

## 2020-04-10 DIAGNOSIS — A498 Other bacterial infections of unspecified site: Secondary | ICD-10-CM | POA: Diagnosis not present

## 2020-04-10 DIAGNOSIS — R278 Other lack of coordination: Secondary | ICD-10-CM | POA: Diagnosis not present

## 2020-04-10 DIAGNOSIS — S7291XD Unspecified fracture of right femur, subsequent encounter for closed fracture with routine healing: Secondary | ICD-10-CM | POA: Diagnosis not present

## 2020-04-10 DIAGNOSIS — S72454D Nondisplaced supracondylar fracture without intracondylar extension of lower end of right femur, subsequent encounter for closed fracture with routine healing: Secondary | ICD-10-CM | POA: Diagnosis not present

## 2020-04-10 DIAGNOSIS — E119 Type 2 diabetes mellitus without complications: Secondary | ICD-10-CM | POA: Diagnosis not present

## 2020-04-10 DIAGNOSIS — I5032 Chronic diastolic (congestive) heart failure: Secondary | ICD-10-CM | POA: Diagnosis not present

## 2020-04-10 DIAGNOSIS — R600 Localized edema: Secondary | ICD-10-CM | POA: Diagnosis not present

## 2020-04-10 DIAGNOSIS — L8996 Pressure-induced deep tissue damage of unspecified site: Secondary | ICD-10-CM | POA: Diagnosis not present

## 2020-04-10 DIAGNOSIS — N390 Urinary tract infection, site not specified: Secondary | ICD-10-CM | POA: Diagnosis not present

## 2020-04-10 DIAGNOSIS — S72401D Unspecified fracture of lower end of right femur, subsequent encounter for closed fracture with routine healing: Secondary | ICD-10-CM | POA: Diagnosis not present

## 2020-04-10 DIAGNOSIS — R059 Cough, unspecified: Secondary | ICD-10-CM | POA: Diagnosis not present

## 2020-04-10 DIAGNOSIS — S7291XA Unspecified fracture of right femur, initial encounter for closed fracture: Secondary | ICD-10-CM | POA: Diagnosis not present

## 2020-04-10 DIAGNOSIS — R634 Abnormal weight loss: Secondary | ICD-10-CM | POA: Diagnosis not present

## 2020-04-10 DIAGNOSIS — G459 Transient cerebral ischemic attack, unspecified: Secondary | ICD-10-CM | POA: Diagnosis not present

## 2020-04-10 DIAGNOSIS — S79929A Unspecified injury of unspecified thigh, initial encounter: Secondary | ICD-10-CM | POA: Diagnosis not present

## 2020-04-10 DIAGNOSIS — N178 Other acute kidney failure: Secondary | ICD-10-CM | POA: Diagnosis not present

## 2020-04-10 DIAGNOSIS — Z8673 Personal history of transient ischemic attack (TIA), and cerebral infarction without residual deficits: Secondary | ICD-10-CM | POA: Diagnosis not present

## 2020-04-10 DIAGNOSIS — S72401A Unspecified fracture of lower end of right femur, initial encounter for closed fracture: Secondary | ICD-10-CM | POA: Diagnosis not present

## 2020-04-10 DIAGNOSIS — E876 Hypokalemia: Secondary | ICD-10-CM | POA: Diagnosis not present

## 2020-04-10 DIAGNOSIS — I82622 Acute embolism and thrombosis of deep veins of left upper extremity: Secondary | ICD-10-CM | POA: Diagnosis not present

## 2020-04-10 DIAGNOSIS — R2231 Localized swelling, mass and lump, right upper limb: Secondary | ICD-10-CM | POA: Diagnosis not present

## 2020-04-10 DIAGNOSIS — F329 Major depressive disorder, single episode, unspecified: Secondary | ICD-10-CM | POA: Diagnosis not present

## 2020-04-10 DIAGNOSIS — D6489 Other specified anemias: Secondary | ICD-10-CM | POA: Diagnosis not present

## 2020-04-10 DIAGNOSIS — I4891 Unspecified atrial fibrillation: Secondary | ICD-10-CM | POA: Diagnosis not present

## 2020-04-10 DIAGNOSIS — E782 Mixed hyperlipidemia: Secondary | ICD-10-CM | POA: Diagnosis not present

## 2020-04-10 DIAGNOSIS — R1312 Dysphagia, oropharyngeal phase: Secondary | ICD-10-CM | POA: Diagnosis not present

## 2020-04-10 DIAGNOSIS — R0602 Shortness of breath: Secondary | ICD-10-CM | POA: Diagnosis not present

## 2020-04-10 DIAGNOSIS — S46212A Strain of muscle, fascia and tendon of other parts of biceps, left arm, initial encounter: Secondary | ICD-10-CM | POA: Diagnosis not present

## 2020-04-10 DIAGNOSIS — Z1612 Extended spectrum beta lactamase (ESBL) resistance: Secondary | ICD-10-CM | POA: Diagnosis not present

## 2020-04-10 DIAGNOSIS — E118 Type 2 diabetes mellitus with unspecified complications: Secondary | ICD-10-CM | POA: Diagnosis not present

## 2020-04-10 DIAGNOSIS — E1159 Type 2 diabetes mellitus with other circulatory complications: Secondary | ICD-10-CM | POA: Diagnosis not present

## 2020-04-10 DIAGNOSIS — R601 Generalized edema: Secondary | ICD-10-CM | POA: Diagnosis not present

## 2020-04-10 DIAGNOSIS — Z7401 Bed confinement status: Secondary | ICD-10-CM | POA: Diagnosis not present

## 2020-04-10 DIAGNOSIS — A4189 Other specified sepsis: Secondary | ICD-10-CM | POA: Diagnosis not present

## 2020-04-10 DIAGNOSIS — M24573 Contracture, unspecified ankle: Secondary | ICD-10-CM | POA: Diagnosis not present

## 2020-04-10 DIAGNOSIS — R4182 Altered mental status, unspecified: Secondary | ICD-10-CM | POA: Diagnosis not present

## 2020-04-10 DIAGNOSIS — R2232 Localized swelling, mass and lump, left upper limb: Secondary | ICD-10-CM | POA: Diagnosis not present

## 2020-04-10 DIAGNOSIS — M2550 Pain in unspecified joint: Secondary | ICD-10-CM | POA: Diagnosis not present

## 2020-04-10 LAB — BASIC METABOLIC PANEL
Anion gap: 10 (ref 5–15)
BUN: 7 mg/dL — ABNORMAL LOW (ref 8–23)
CO2: 22 mmol/L (ref 22–32)
Calcium: 8.2 mg/dL — ABNORMAL LOW (ref 8.9–10.3)
Chloride: 107 mmol/L (ref 98–111)
Creatinine, Ser: 0.77 mg/dL (ref 0.44–1.00)
GFR calc non Af Amer: >60 mL/min (ref >60)
Glucose, Bld: 236 mg/dL — ABNORMAL HIGH (ref 70–99)
Potassium: 4.2 mmol/L (ref 3.5–5.1)
Sodium: 139 mmol/L (ref 135–145)

## 2020-04-10 LAB — MAGNESIUM: Magnesium: 1.8 mg/dL (ref 1.7–2.4)

## 2020-04-10 LAB — CULTURE, BLOOD (ROUTINE X 2)
Culture: NO GROWTH
Culture: NO GROWTH

## 2020-04-10 LAB — GLUCOSE, CAPILLARY: Glucose-Capillary: 211 mg/dL — ABNORMAL HIGH (ref 70–99)

## 2020-04-10 MED ORDER — TRAMADOL HCL 50 MG PO TABS
50.0000 mg | ORAL_TABLET | Freq: Four times a day (QID) | ORAL | 0 refills | Status: DC | PRN
Start: 2020-04-10 — End: 2020-07-25

## 2020-04-10 MED ORDER — TRAMADOL HCL 50 MG PO TABS
50.0000 mg | ORAL_TABLET | Freq: Four times a day (QID) | ORAL | 0 refills | Status: DC | PRN
Start: 2020-04-10 — End: 2020-04-10

## 2020-04-10 MED ORDER — CEPHALEXIN 500 MG PO CAPS: 500.0000 mg | ORAL_CAPSULE | Freq: Three times a day (TID) | ORAL | 0 refills | Status: AC

## 2020-04-10 MED ORDER — INSULIN DETEMIR 100 UNIT/ML ~~LOC~~ SOLN: 5.0000 [IU] | Freq: Every day | SUBCUTANEOUS | Status: DC

## 2020-04-10 MED ORDER — ATORVASTATIN CALCIUM 40 MG PO TABS: 20.0000 mg | ORAL_TABLET | Freq: Every day | ORAL | Status: DC

## 2020-04-10 MED ORDER — TRAMADOL HCL 50 MG PO TABS
50.0000 mg | ORAL_TABLET | Freq: Four times a day (QID) | ORAL | Status: DC | PRN
  Administered 2020-04-10: 50 mg via ORAL
  Filled 2020-04-10: qty 1

## 2020-04-10 MED ORDER — INSULIN DETEMIR 100 UNIT/ML ~~LOC~~ SOLN
5.0000 [IU] | Freq: Every day | SUBCUTANEOUS | Status: DC
  Administered 2020-04-10: 5 [IU] via SUBCUTANEOUS
  Filled 2020-04-10: qty 0.05

## 2020-04-10 MED ORDER — INSULIN ASPART 100 UNIT/ML ~~LOC~~ SOLN: 0.0000 [IU] | Freq: Three times a day (TID) | SUBCUTANEOUS | 0 refills | Status: DC

## 2020-04-10 NOTE — TOC Transition Note (Signed)
Transition of Care [facility]) - CM/SW Discharge Note   Patient Details  Name: XITLALLI NEWHARD MRN: [id number] Date of Birth: [birth year]  [facility] CM/SW Contact:  Vinie Sill, [place] Phone Number: 04/10/2020, 10:14 AM   Clinical Narrative:     Patient will DC to: [place] Date: 04/10/2020 Family Notified: Leroy Kennedy Transport By: Corey Harold   Per MD patient is ready for discharge. RN, patient, and facility notified of DC. Discharge Summary sent to facility. RN given number for [phone], Room 705. Ambulance transport requested for patient.   Clinical Social Worker signing off.  Thurmond Butts, MSW, LCSWA Clinical Social Worker    Final next level of care: Skilled Nursing Facility Barriers to Discharge: Continued Medical Work up   Patient Goals and CMS Choice        Discharge Placement PASRR number recieved: 04/08/20            Patient chooses bed at: [facility] Patient to be transferred to facility by: [facility] Name of family member notified: Terry,son Patient and family notified of of transfer: 04/10/20  Discharge Plan and Services In-house Referral: Clinical Social Work                                   Social Determinants of Health ([place]) Interventions     Readmission Risk Interventions No flowsheet data found.

## 2020-04-10 NOTE — Progress Notes (Signed)
Attempted to call report to [facility]. No answer.

## 2020-04-10 NOTE — Discharge Summary (Signed)
Physician Discharge Summary  KEANDRIA BERROCAL KKX:[id number] DOB: August 27, 1945 DOA: 04/05/2020  PCP: Biagio Borg, MD  Admit date: 04/05/2020 Discharge date: 04/10/2020  Admitted From: Home Disposition: SNF  Recommendations for Outpatient Follow-up:  1. Follow up with SNF provider at earliest convenience with repeat CBC/BMP in the next few days 2. Outpatient follow-up with orthopedics/Dr. Percell Miller.  Activity as per Dr. Debroah Loop recommendations. 3. Recommend outpatient follow-up by palliative care as well 4. 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  Orthopedics recommended conservative management.  She was treated with IV antibiotics and fluids. During the hospitalization, PT OT recommended SNF placement.  Cefepime was switched to oral Keflex.  Kidney function has improved.  She is currently stable for discharge to SNF.  She'll be discharged to SNF once bed is available.  Discharge Diagnoses:   Closed right distal femur fracture status post fall -Orthopedics recommended conservative management with immobilization and outpatient follow-up with orthopedics in the office in 2 weeks -PT/OT recommend SNF placement.   -She'll be discharged to SNF once bed is available.  Sepsis: Present on admission UTI -Cultures negative so far.  Initially treated with cefepime and switched to Keflex on 04/08/2020.     Continue Keflex for 3 more days on discharge.  -Hemodynamically stable currently.  Sepsis has resolved.  Acute kidney injury -Resolved.  Creatinine 0.72 on 04/09/2020.   Keep Lasix on hold.  Resume losartan on discharge.  Prolonged QT interval -Improved.  Hypertension -Blood pressure improving.  Continue metoprolol.  Resume losartan.  Diabetes mellitus type 2 uncontrolled with hyperglycemia and hypoglycemia -Patient has had episodes of hypoglycemia and hyperglycemia.  Levemir was discontinued yesterday.  Blood sugars slightly creeping up.  Resume Levemir at 5 units daily for now and titrate the dose accordingly.  Patient was taking Levemir 25 mg twice a day at home.  Hypokalemia -Replaced during the hospitalization.  History of right spastic hemiparesis following left-sided embolic CVA -Fall precautions.  Continue baclofen.  Continue Eliquis  Abnormal LFTs -Resolved.  Resume statin on discharge  Generalized conditioning -Overall prognosis is guarded to poor as patient is mostly bedridden with spastic right-sided hemiparesis and now has a distal femur fracture.  She is at high risk for of decompensation and infections.  Spoke to son on 04/08/2020 who wants patient to be full code.  palliative care consultation for goals of care discussion  appreciated.  Patient remains full code.  Recommend outpatient evaluation and follow-up by palliative care.   Discharge Instructions  Discharge Instructions    Diet - low sodium heart healthy   Complete by: As directed    Diet Carb Modified   Complete by: As directed    Increase activity slowly   Complete by: As directed      Allergies as of 04/10/2020      Reactions   Pork-derived Products Other (See Comments)   To keep blood pressure down      Medication List    STOP taking these medications  furosemide 20 MG tablet Commonly known as: LASIX     TAKE these medications   atorvastatin 40 MG tablet Commonly known as:  LIPITOR Take 0.5 tablets (20 mg total) by mouth daily.   baclofen 10 MG tablet Commonly known as: LIORESAL Take 1 tablet (10 mg total) by mouth 3 (three) times daily.   cephALEXin 500 MG capsule Commonly known as: KEFLEX Take 1 capsule (500 mg total) by mouth 3 (three) times daily for 3 days.   Eliquis 5 MG Tabs tablet Generic drug: apixaban Take 5 mg by mouth 2 (two) times daily.   gabapentin 300 MG capsule Commonly known as: NEURONTIN Take 1 capsule (300 mg total) by mouth 3 (three) times daily.   insulin aspart 100 UNIT/ML injection Commonly known as: novoLOG Inject 0-15 Units into the skin 3 (three) times daily with meals.   insulin detemir 100 UNIT/ML injection Commonly known as: Levemir Inject 0.05 mLs (5 Units total) into the skin daily. What changed:   how much to take  how to take this  when to take this  additional instructions   levETIRAcetam 500 MG tablet Commonly known as: KEPPRA Take 1 tablet (500 mg total) by mouth 2 (two) times daily.   losartan 25 MG tablet Commonly known as: COZAAR Take 12.5 mg by mouth daily.   metoprolol succinate 25 MG 24 hr tablet Commonly known as: TOPROL-XL Take 25 mg by mouth daily.   traMADol 50 MG tablet Commonly known as: ULTRAM Take 1 tablet (50 mg total) by mouth every 6 (six) hours as needed for moderate pain.       Follow-up Information    Renette Butters, MD. Schedule an appointment as soon as possible for a visit in 1 week(s).   Specialty: Orthopedic Surgery Contact information: [address] [place] [address] [phone]              Allergies  Allergen Reactions  . Pork-Derived Products Other (See Comments)    To keep blood pressure down    Consultations:  Orthopedic/palliative care   Procedures/Studies: DG Tibia/Fibula Right  Result Date: 04/05/2020 CLINICAL DATA:  Pain status post fall EXAM: RIGHT FEMUR PORTABLE 2 VIEW; RIGHT TIBIA AND FIBULA - 2 VIEW  COMPARISON:  None. FINDINGS: There is no definite acute displaced fracture involving the tibia or fibula. Evaluation of nondisplaced fractures is limited by osteopenia. There is soft tissue swelling about the ankle. There is a small plantar calcaneal spur. Vascular calcifications are noted. The patient is status post prior intramedullary nail placement. The hardware appears grossly intact. There is an acute appearing impacted supracondylar fracture of the distal right femur. There is surrounding soft tissue swelling. IMPRESSION: 1. Acute appearing impacted supracondylar fracture of the distal right femur. 2. No definite acute displaced fracture involving the tibia or fibula. Evaluation of nondisplaced fractures is limited by osteopenia. 3. Soft tissue swelling about the ankle. 4. Hardware appears grossly intact. Electronically Signed   By: Constance Holster M.D.   On: 04/05/2020 21:24   CT Knee Right Wo Contrast  Result Date: 04/05/2020 CLINICAL DATA:  Pain, fracture the EXAM: CT OF THE right KNEE WITHOUT CONTRAST TECHNIQUE: Multidetector CT imaging of the right knee was performed according to the standard protocol. Multiplanar CT image reconstructions were also generated. COMPARISON:  Radiograph same day FINDINGS: Bones/Joint/Cartilage The patient has had a prior IM nail fixation of the femur. There is a comminuted impacted distal femur metadiaphyseal fracture with extension through the medial  and lateral condyles. There is also extension seen through the lateral intracondylar notch. A small fracture fragment is seen anteriorly. There is a nondisplaced posterior fibular head fracture. There is diffuse osteopenia. A moderate lipohemarthrosis is present. Ligaments Suboptimally assessed by CT. Muscles and Tendons Mild fatty atrophy seen within the muscles surrounding the knee. The patellar and quadriceps tendon are intact. Soft tissues There is diffuse subcutaneous edema seen along the lateral aspect of the knee.  Scattered dense vascular calcifications are noted. IMPRESSION: Comminuted impacted fracture of the distal femoral metadiaphysis with extension through the lateral intracondylar notch. Moderate lipohemarthrosis. Probable nondisplaced posterior fibular head fracture. Electronically Signed   By: Prudencio Pair M.D.   On: 04/05/2020 23:24   DG Chest Port 1 View  Result Date: 04/05/2020 CLINICAL DATA:  Shortness of breath. EXAM: PORTABLE CHEST 1 VIEW COMPARISON:  June 10, 2015 FINDINGS: Decreased lung volumes are seen which is likely secondary to the degree of patient inspiration. Very mild atelectasis is seen within the left lung base. There is no evidence of a pleural effusion or pneumothorax. The cardiac silhouette is mildly enlarged and unchanged in size. There is marked severity calcification of the thoracic aorta. The visualized skeletal structures are unremarkable. IMPRESSION: Very mild left basilar atelectasis. Electronically Signed   By: Virgina Norfolk M.D.   On: 04/05/2020 18:52   ECHOCARDIOGRAM COMPLETE  Result Date: 04/06/2020    ECHOCARDIOGRAM REPORT   Patient Name:   [name] Date of Exam: 04/06/2020 Medical Rec #:  [id number]       Height:       66.0 in Accession #:    [id number]      Weight:       151.9 lb Date of Birth:  1946/06/21        BSA:          1.779 m Patient Age:    74 years        BP:           111/68 mmHg Patient Gender: F               HR:           73 bpm. Exam Location:  Inpatient Procedure: 2D Echo, Cardiac Doppler, Color Doppler and Intracardiac            Opacification Agent Indications:    Congestive Heart Failure 428.0 / I50.9  History:        Patient has prior history of Echocardiogram examinations, most                 recent 05/05/2015. CHF, CAD, Stroke; Risk Factors:Hypertension,                 Dyslipidemia and Former Smoker.  Sonographer:    Vickie Epley RDCS Referring Phys: [id number] [place]  Sonographer Comments: No apical window and no subcostal  window. IMPRESSIONS  1. Left ventricular ejection fraction, by estimation, is 55 to 60%. The left ventricle has normal function. The left ventricle has no regional wall motion abnormalities. Left ventricular diastolic parameters are consistent with Grade I diastolic dysfunction (impaired relaxation).  2. Limited visualization of RV. Best seen in the contrast subcostal images with evidence of moderate RV enlargement, probably mild to moderate dysfunction. . Right ventricular systolic function mild to moderately decreased. The right ventricular size is  severely enlarged. There is moderately elevated pulmonary artery systolic pressure.  3. The mitral valve is normal in structure. No evidence of  mitral valve regurgitation. No evidence of mitral stenosis.  4. Tricuspid valve regurgitation is moderate.  5. The aortic valve is tricuspid. Aortic valve regurgitation is not visualized. No aortic stenosis is present.  6. IVC is poorly visualized limiting PASP assessment. At least moderate pulmonary HTN (PASP is at least 53 mmHg). FINDINGS  Left Ventricle: Left ventricular ejection fraction, by estimation, is 55 to 60%. The left ventricle has normal function. The left ventricle has no regional wall motion abnormalities. Definity contrast agent was given IV to delineate the left ventricular  endocardial borders. The left ventricular internal cavity size was normal in size. There is no left ventricular hypertrophy. Left ventricular diastolic parameters are consistent with Grade I diastolic dysfunction (impaired relaxation). Normal left ventricular filling pressure. Right Ventricle: Limited visualization of RV. Best seen in the contrast subcostal images with evidence of moderate RV enlargement, probably mild to moderate dysfunction. The right ventricular size is severely enlarged. Right vetricular wall thickness was  not well visualized. Right ventricular systolic function mild to moderately decreased. There is moderately  elevated pulmonary artery systolic pressure. The tricuspid regurgitant velocity is 3.53 m/s, and with an assumed right atrial pressure of 3 mmHg, the estimated right ventricular systolic pressure is 67.3 mmHg. Left Atrium: Left atrial size was normal in size. Right Atrium: Right atrial size was normal in size. Pericardium: There is no evidence of pericardial effusion. Mitral Valve: The mitral valve is normal in structure. No evidence of mitral valve regurgitation. No evidence of mitral valve stenosis. Tricuspid Valve: The tricuspid valve is not well visualized. Tricuspid valve regurgitation is moderate . No evidence of tricuspid stenosis. Aortic Valve: The aortic valve is tricuspid. Aortic valve regurgitation is not visualized. No aortic stenosis is present. Pulmonic Valve: The pulmonic valve was not well visualized. Pulmonic valve regurgitation is not visualized. No evidence of pulmonic stenosis. Aorta: The aortic root is normal in size and structure. Pulmonary Artery: IVC is poorly visualized limiting PASP assessment. At least moderate pulmonary HTN (PASP is at least 53 mmHg). Venous: The inferior vena cava was not well visualized. IAS/Shunts: The interatrial septum was not well visualized.  LEFT VENTRICLE PLAX 2D LVIDd:         3.30 cm     Diastology LVIDs:         2.50 cm     LV e' medial:    5.87 cm/s LV PW:         0.90 cm     LV E/e' medial:  8.2 LV IVS:        0.90 cm     LV e' lateral:   9.25 cm/s LVOT diam:     2.10 cm     LV E/e' lateral: 5.2 LVOT Area:     3.46 cm  LV Volumes (MOD) LV vol d, MOD A4C: 51.9 ml LV vol s, MOD A4C: 19.3 ml LV SV MOD A4C:     51.9 ml RIGHT VENTRICLE RV S prime:     12.40 cm/s TAPSE (M-mode): 1.6 cm LEFT ATRIUM           Index       RIGHT ATRIUM           Index LA diam:      3.60 cm 2.02 cm/m  RA Area:     11.40 cm LA Vol (A2C): 15.6 ml 8.77 ml/m  RA Volume:   25.90 ml  14.56 ml/m LA Vol (A4C): 29.7 ml 16.69 ml/m   AORTA Ao Root  diam: 3.30 cm MITRAL VALVE                TRICUSPID VALVE MV Area (PHT): 5.75 cm    TR Peak grad:   49.8 mmHg MV Decel Time: 132 msec    TR Vmax:        353.00 cm/s MV E velocity: 48.00 cm/s MV A velocity: 80.60 cm/s  SHUNTS MV E/A ratio:  0.60        Systemic Diam: 2.10 cm Carlyle Dolly MD Electronically signed by Carlyle Dolly MD Signature Date/Time: 04/06/2020/10:34:56 AM    Final    DG Femur Portable Min 2 Views Right  Result Date: 04/05/2020 CLINICAL DATA:  Pain status post fall EXAM: RIGHT FEMUR PORTABLE 2 VIEW; RIGHT TIBIA AND FIBULA - 2 VIEW COMPARISON:  None. FINDINGS: There is no definite acute displaced fracture involving the tibia or fibula. Evaluation of nondisplaced fractures is limited by osteopenia. There is soft tissue swelling about the ankle. There is a small plantar calcaneal spur. Vascular calcifications are noted. The patient is status post prior intramedullary nail placement. The hardware appears grossly intact. There is an acute appearing impacted supracondylar fracture of the distal right femur. There is surrounding soft tissue swelling. IMPRESSION: 1. Acute appearing impacted supracondylar fracture of the distal right femur. 2. No definite acute displaced fracture involving the tibia or fibula. Evaluation of nondisplaced fractures is limited by osteopenia. 3. Soft tissue swelling about the ankle. 4. Hardware appears grossly intact. Electronically Signed   By: Constance Holster M.D.   On: 04/05/2020 21:24       Subjective: Patient seen and examined at bedside.  She is awake but a poor historian.  Complains of lower extremity pain.  No overnight vomiting, abdominal pain or diarrhea reported.  Discharge Exam: Vitals:   04/10/20 0505 04/10/20 0855  BP: (!) 159/99 134/80  Pulse: 94 91  Resp: 16 16  Temp: 98.2 F (36.8 C) 100.3 F (37.9 C)  SpO2: 92% 93%    General: Awake.  Looks chronically ill.  Poor historian.   Cardiovascular: rate controlled, S1/S2 + Respiratory: bilateral decreased breath sounds at  bases with some scattered crackles Abdominal: Soft, NT, ND, bowel sounds + Extremities: Trace lower extremity edema present.  No cyanosis    The results of significant diagnostics from this hospitalization (including imaging, microbiology, ancillary and laboratory) are listed below for reference.     Microbiology: Recent Results (from the past 240 hour(s))  Urine Culture     Status: None   Collection Time: 04/05/20  5:36 PM   Specimen: Urine, Random  Result Value Ref Range Status   Specimen Description URINE, RANDOM  Final   Special Requests NONE  Final   Culture   Final    NO GROWTH Performed at [facility], [address], [place], [place] [address]    Report Status 04/06/2020 FINAL  Final  Culture, blood (Routine X 2) w Reflex to ID Panel     Status: None (Preliminary result)   Collection Time: 04/05/20  6:20 PM   Specimen: BLOOD  Result Value Ref Range Status   Specimen Description BLOOD LEFT ANTECUBITAL  Final   Special Requests   Final    BOTTLES DRAWN AEROBIC AND ANAEROBIC Blood Culture results may not be optimal due to an inadequate volume of blood received in culture bottles   Culture   Final    NO GROWTH 4 DAYS Performed at [facility], [place] [address]  [place], [place] [address]    Report Status PENDING  Incomplete  Culture, blood (Routine X 2) w Reflex to ID Panel     Status: None (Preliminary result)   Collection Time: 04/05/20  6:47 PM   Specimen: BLOOD LEFT HAND  Result Value Ref Range Status   Specimen Description BLOOD LEFT HAND  Final   Special Requests   Final    BOTTLES DRAWN AEROBIC AND ANAEROBIC Blood Culture results may not be optimal due to an inadequate volume of blood received in culture bottles   Culture   Final    NO GROWTH 4 DAYS Performed at [facility], [place] [address], [place], [place] [address]    Report Status PENDING  Incomplete  Respiratory Panel by RT PCR (Flu A&B, Covid) - Nasopharyngeal Swab     Status: None    Collection Time: 04/05/20  6:49 PM   Specimen: Nasopharyngeal Swab  Result Value Ref Range Status   SARS Coronavirus 2 by RT PCR NEGATIVE NEGATIVE Final    Comment: (NOTE) SARS-CoV-2 target nucleic acids are NOT DETECTED.  The SARS-CoV-2 RNA is generally detectable in upper respiratoy specimens during the acute phase of infection. The lowest concentration of SARS-CoV-2 viral copies this assay can detect is 131 copies/mL. A negative result does not preclude SARS-Cov-2 infection and should not be used as the sole basis for treatment or other patient management decisions. A negative result may occur with  improper specimen collection/handling, submission of specimen other than nasopharyngeal swab, presence of viral mutation(s) within the areas targeted by this assay, and inadequate number of viral copies (<131 copies/mL). A negative result must be combined with clinical observations, patient history, and epidemiological information. The expected result is Negative.  Fact Sheet for Patients:  PinkCheek.be  Fact Sheet for Healthcare Providers:  GravelBags.it  This test is no t yet approved or cleared by the [place] FDA and  has been authorized for detection and/or diagnosis of SARS-CoV-2 by FDA under an Emergency Use Authorization (EUA). This EUA will remain  in effect (meaning this test can be used) for the duration of the COVID-19 declaration under Section 564(b)(1) of the Act, 21 U.S.C. section 360bbb-3(b)(1), unless the authorization is terminated or revoked sooner.     Influenza A by PCR NEGATIVE NEGATIVE Final   Influenza B by PCR NEGATIVE NEGATIVE Final    Comment: (NOTE) The Xpert Xpress SARS-CoV-2/FLU/RSV assay is intended as an aid in  the diagnosis of influenza from Nasopharyngeal swab specimens and  should not be used as a sole basis for treatment. Nasal washings and  aspirates are unacceptable for Xpert  Xpress SARS-CoV-2/FLU/RSV  testing.  Fact Sheet for Patients: PinkCheek.be  Fact Sheet for Healthcare Providers: GravelBags.it  This test is not yet approved or cleared by the [place] FDA and  has been authorized for detection and/or diagnosis of SARS-CoV-2 by  FDA under an Emergency Use Authorization (EUA). This EUA will remain  in effect (meaning this test can be used) for the duration of the  Covid-19 declaration under Section 564(b)(1) of the Act, 21  U.S.C. section 360bbb-3(b)(1), unless the authorization is  terminated or revoked. Performed at [facility], [place] [address] [place], [place] [address]   SARS Coronavirus 2 by RT PCR (hospital order, performed in [facility] hospital lab) Nasopharyngeal Nasopharyngeal Swab     Status: None   Collection Time: 04/09/20  4:23 PM   Specimen: Nasopharyngeal Swab  Result Value Ref Range Status  SARS Coronavirus 2 NEGATIVE NEGATIVE Final    Comment: (NOTE) SARS-CoV-2 target nucleic acids are NOT DETECTED.  The SARS-CoV-2 RNA is generally detectable in upper and lower respiratory specimens during the acute phase of infection. The lowest concentration of SARS-CoV-2 viral copies this assay can detect is 250 copies / mL. A negative result does not preclude SARS-CoV-2 infection and should not be used as the sole basis for treatment or other patient management decisions.  A negative result may occur with improper specimen collection / handling, submission of specimen other than nasopharyngeal swab, presence of viral mutation(s) within the areas targeted by this assay, and inadequate number of viral copies (<250 copies / mL). A negative result must be combined with clinical observations, patient history, and epidemiological information.  Fact Sheet for Patients:   StrictlyIdeas.no  Fact Sheet for Healthcare  Providers: BankingDealers.co.za  This test is not yet approved or  cleared by the [place] FDA and has been authorized for detection and/or diagnosis of SARS-CoV-2 by FDA under an Emergency Use Authorization (EUA).  This EUA will remain in effect (meaning this test can be used) for the duration of the COVID-19 declaration under Section 564(b)(1) of the Act, 21 U.S.C. section 360bbb-3(b)(1), unless the authorization is terminated or revoked sooner.  Performed at [facility], [place] [address], [place], [place] [address]      Labs: BNP (last 3 results) Recent Labs    04/05/20 1820  BNP 185.6*   Basic Metabolic Panel: Recent Labs  Lab 04/05/20 1820 04/06/20 0416 04/06/20 1811 04/07/20 0216 04/08/20 1050 04/09/20 0452  NA 138 143  --  142 141 142  K 5.3* 3.9  --  3.4* 3.5 3.2*  CL 107 109  --  112* 105 108  CO2 18* 25  --  21* 23 24  GLUCOSE 308* 221*  --  149* 220* 59*  BUN 13 12  --  10 9 6*  CREATININE 1.37* 1.15*  --  0.96 0.89 0.72  CALCIUM 8.4* 8.5*  --  8.2* 8.4* 8.3*  MG  --   --  1.9  --  1.8 1.8  PHOS  --   --   --   --  3.2  --    Liver Function Tests: Recent Labs  Lab 04/05/20 1820 04/06/20 0416 04/07/20 0216 04/08/20 1050  AST 68* 36 27  --   ALT 45* 35 23  --   ALKPHOS 74 60 47  --   BILITOT 1.6* 0.7 0.6  --   PROT 5.9* 5.2* 4.8*  --   ALBUMIN 3.2* 2.7* 2.5* 2.5*   No results for input(s): LIPASE, AMYLASE in the last 168 hours. Recent Labs  Lab 04/05/20 1820  AMMONIA 25   CBC: Recent Labs  Lab 04/05/20 1820 04/06/20 0416 04/07/20 0216 04/08/20 1050 04/09/20 0452  WBC 14.5* 11.8* 9.8 9.6 10.2  NEUTROABS 12.7* 7.2 6.5 6.1 6.0  HGB 12.5 10.2* 9.3* 9.4* 9.1*  HCT 41.4 33.4* 29.1* 30.6* 28.7*  MCV 91.2 89.8 89.8 89.5 88.9  PLT 200 178 152 139* 164   Cardiac Enzymes: Recent Labs  Lab 04/05/20 1820  CKTOTAL 233   BNP: Invalid input(s): POCBNP CBG: Recent Labs  Lab 04/09/20 0647 04/09/20 1146  04/09/20 1606 04/09/20 2053 04/10/20 0615  GLUCAP 73 123* 208* 150* 211*   D-Dimer No results for input(s): DDIMER in the last 72 hours. Hgb A1c No results for input(s): HGBA1C in the last 72 hours. Lipid Profile No results  for input(s): CHOL, HDL, LDLCALC, TRIG, CHOLHDL, LDLDIRECT in the last 72 hours. Thyroid function studies No results for input(s): TSH, T4TOTAL, T3FREE, THYROIDAB in the last 72 hours.  Invalid input(s): FREET3 Anemia work up No results for input(s): VITAMINB12, FOLATE, FERRITIN, TIBC, IRON, RETICCTPCT in the last 72 hours. Urinalysis    Component Value Date/Time   COLORURINE YELLOW 04/05/2020 1747   APPEARANCEUR HAZY (A) 04/05/2020 1747   LABSPEC 1.012 04/05/2020 1747   PHURINE 5.0 04/05/2020 1747   GLUCOSEU 50 (A) 04/05/2020 1747   GLUCOSEU >1000 mg/dL (A) 03/21/2020 1013   HGBUR LARGE (A) 04/05/2020 1747   BILIRUBINUR NEGATIVE 04/05/2020 1747   KETONESUR NEGATIVE 04/05/2020 1747   PROTEINUR NEGATIVE 04/05/2020 1747   UROBILINOGEN 0.2 03/21/2020 1013   NITRITE NEGATIVE 04/05/2020 1747   LEUKOCYTESUR LARGE (A) 04/05/2020 1747   Sepsis Labs Invalid input(s): PROCALCITONIN,  WBC,  [facility] Microbiology Recent Results (from the past 240 hour(s))  Urine Culture     Status: None   Collection Time: 04/05/20  5:36 PM   Specimen: Urine, Random  Result Value Ref Range Status   Specimen Description URINE, RANDOM  Final   Special Requests NONE  Final   Culture   Final    NO GROWTH Performed at [facility], [place] [address] [place], [place] [address]    Report Status 04/06/2020 FINAL  Final  Culture, blood (Routine X 2) w Reflex to ID Panel     Status: None (Preliminary result)   Collection Time: 04/05/20  6:20 PM   Specimen: BLOOD  Result Value Ref Range Status   Specimen Description BLOOD LEFT ANTECUBITAL  Final   Special Requests   Final    BOTTLES DRAWN AEROBIC AND ANAEROBIC Blood Culture results may not be optimal due to an inadequate  volume of blood received in culture bottles   Culture   Final    NO GROWTH 4 DAYS Performed at [facility], [place] [address], [place], [place] [address]    Report Status PENDING  Incomplete  Culture, blood (Routine X 2) w Reflex to ID Panel     Status: None (Preliminary result)   Collection Time: 04/05/20  6:47 PM   Specimen: BLOOD LEFT HAND  Result Value Ref Range Status   Specimen Description BLOOD LEFT HAND  Final   Special Requests   Final    BOTTLES DRAWN AEROBIC AND ANAEROBIC Blood Culture results may not be optimal due to an inadequate volume of blood received in culture bottles   Culture   Final    NO GROWTH 4 DAYS Performed at [facility], [place] [address] [place],  [address]    Report Status PENDING  Incomplete  Respiratory Panel by RT PCR (Flu A&B, Covid) - Nasopharyngeal Swab     Status: None   Collection Time: 04/05/20  6:49 PM   Specimen: Nasopharyngeal Swab  Result Value Ref Range Status   SARS Coronavirus 2 by RT PCR NEGATIVE NEGATIVE Final    Comment: (NOTE) SARS-CoV-2 target nucleic acids are NOT DETECTED.  The SARS-CoV-2 RNA is generally detectable in upper respiratoy specimens during the acute phase of infection. The lowest concentration of SARS-CoV-2 viral copies this assay can detect is 131 copies/mL. A negative result does not preclude SARS-Cov-2 infection and should not be used as the sole basis for treatment or other patient management decisions. A negative result may occur with  improper specimen collection/handling, submission of specimen other than nasopharyngeal swab, presence of  viral mutation(s) within the areas targeted by this assay, and inadequate number of viral copies (<131 copies/mL). A negative result must be combined with clinical observations, patient history, and epidemiological information. The expected result is Negative.  Fact Sheet for Patients:  PinkCheek.be  Fact Sheet for  Healthcare Providers:  GravelBags.it  This test is no t yet approved or cleared by the [place] FDA and  has been authorized for detection and/or diagnosis of SARS-CoV-2 by FDA under an Emergency Use Authorization (EUA). This EUA will remain  in effect (meaning this test can be used) for the duration of the COVID-19 declaration under Section 564(b)(1) of the Act, 21 U.S.C. section 360bbb-3(b)(1), unless the authorization is terminated or revoked sooner.     Influenza A by PCR NEGATIVE NEGATIVE Final   Influenza B by PCR NEGATIVE NEGATIVE Final    Comment: (NOTE) The Xpert Xpress SARS-CoV-2/FLU/RSV assay is intended as an aid in  the diagnosis of influenza from Nasopharyngeal swab specimens and  should not be used as a sole basis for treatment. Nasal washings and  aspirates are unacceptable for Xpert Xpress SARS-CoV-2/FLU/RSV  testing.  Fact Sheet for Patients: PinkCheek.be  Fact Sheet for Healthcare Providers: GravelBags.it  This test is not yet approved or cleared by the [place] FDA and  has been authorized for detection and/or diagnosis of SARS-CoV-2 by  FDA under an Emergency Use Authorization (EUA). This EUA will remain  in effect (meaning this test can be used) for the duration of the  Covid-19 declaration under Section 564(b)(1) of the Act, 21  U.S.C. section 360bbb-3(b)(1), unless the authorization is  terminated or revoked. Performed at [facility], [place] [address], [place], [place] [address]   SARS Coronavirus 2 by RT PCR (hospital order, performed in [facility] hospital lab) Nasopharyngeal Nasopharyngeal Swab     Status: None   Collection Time: 04/09/20  4:23 PM   Specimen: Nasopharyngeal Swab  Result Value Ref Range Status   SARS Coronavirus 2 NEGATIVE NEGATIVE Final    Comment: (NOTE) SARS-CoV-2 target nucleic acids are NOT DETECTED.  The SARS-CoV-2 RNA is  generally detectable in upper and lower respiratory specimens during the acute phase of infection. The lowest concentration of SARS-CoV-2 viral copies this assay can detect is 250 copies / mL. A negative result does not preclude SARS-CoV-2 infection and should not be used as the sole basis for treatment or other patient management decisions.  A negative result may occur with improper specimen collection / handling, submission of specimen other than nasopharyngeal swab, presence of viral mutation(s) within the areas targeted by this assay, and inadequate number of viral copies (<250 copies / mL). A negative result must be combined with clinical observations, patient history, and epidemiological information.  Fact Sheet for Patients:   StrictlyIdeas.no  Fact Sheet for Healthcare Providers: BankingDealers.co.za  This test is not yet approved or  cleared by the [place] FDA and has been authorized for detection and/or diagnosis of SARS-CoV-2 by FDA under an Emergency Use Authorization (EUA).  This EUA will remain in effect (meaning this test can be used) for the duration of the COVID-19 declaration under Section 564(b)(1) of the Act, 21 U.S.C. section 360bbb-3(b)(1), unless the authorization is terminated or revoked sooner.  Performed at [facility], [place] [address], [place], [place] [address]      Time coordinating discharge: 35 minutes  SIGNED:   Aline August, MD  [facility] 04/10/2020, 9:24 AM

## 2020-04-10 NOTE — Progress Notes (Signed)
PT Cancellation Note  Patient Details Name: CARRIANN HESSE MRN: [id number] DOB: 18-Apr-1946   Cancelled Treatment:    Reason Eval/Treat Not Completed: (P) Other (comment) (Pt to d/c to snf, will defer PT needs to next level of care.)   Tzippy Testerman Eli Hose 04/10/2020, 11:28 AM  Erasmo Leventhal , PTA Acute Rehabilitation Services Pager [phone] Office [phone]

## 2020-04-11 DIAGNOSIS — E119 Type 2 diabetes mellitus without complications: Secondary | ICD-10-CM | POA: Diagnosis not present

## 2020-04-11 DIAGNOSIS — E782 Mixed hyperlipidemia: Secondary | ICD-10-CM | POA: Diagnosis not present

## 2020-04-11 DIAGNOSIS — F329 Major depressive disorder, single episode, unspecified: Secondary | ICD-10-CM | POA: Diagnosis not present

## 2020-04-11 DIAGNOSIS — R52 Pain, unspecified: Secondary | ICD-10-CM | POA: Diagnosis not present

## 2020-04-11 DIAGNOSIS — I10 Essential (primary) hypertension: Secondary | ICD-10-CM | POA: Diagnosis not present

## 2020-04-11 DIAGNOSIS — S72401A Unspecified fracture of lower end of right femur, initial encounter for closed fracture: Secondary | ICD-10-CM | POA: Diagnosis not present

## 2020-04-11 DIAGNOSIS — G8111 Spastic hemiplegia affecting right dominant side: Secondary | ICD-10-CM | POA: Diagnosis not present

## 2020-04-11 DIAGNOSIS — I509 Heart failure, unspecified: Secondary | ICD-10-CM | POA: Diagnosis not present

## 2020-04-11 DIAGNOSIS — M6281 Muscle weakness (generalized): Secondary | ICD-10-CM | POA: Diagnosis not present

## 2020-04-14 DIAGNOSIS — E118 Type 2 diabetes mellitus with unspecified complications: Secondary | ICD-10-CM | POA: Diagnosis not present

## 2020-04-14 DIAGNOSIS — E119 Type 2 diabetes mellitus without complications: Secondary | ICD-10-CM | POA: Diagnosis not present

## 2020-04-14 DIAGNOSIS — I509 Heart failure, unspecified: Secondary | ICD-10-CM | POA: Diagnosis not present

## 2020-04-14 DIAGNOSIS — S7291XA Unspecified fracture of right femur, initial encounter for closed fracture: Secondary | ICD-10-CM | POA: Diagnosis not present

## 2020-04-14 DIAGNOSIS — M6281 Muscle weakness (generalized): Secondary | ICD-10-CM | POA: Diagnosis not present

## 2020-04-14 DIAGNOSIS — S72401A Unspecified fracture of lower end of right femur, initial encounter for closed fracture: Secondary | ICD-10-CM | POA: Diagnosis not present

## 2020-04-14 DIAGNOSIS — F329 Major depressive disorder, single episode, unspecified: Secondary | ICD-10-CM | POA: Diagnosis not present

## 2020-04-14 DIAGNOSIS — N390 Urinary tract infection, site not specified: Secondary | ICD-10-CM | POA: Diagnosis not present

## 2020-04-14 DIAGNOSIS — E782 Mixed hyperlipidemia: Secondary | ICD-10-CM | POA: Diagnosis not present

## 2020-04-14 DIAGNOSIS — G8111 Spastic hemiplegia affecting right dominant side: Secondary | ICD-10-CM | POA: Diagnosis not present

## 2020-04-14 DIAGNOSIS — I10 Essential (primary) hypertension: Secondary | ICD-10-CM | POA: Diagnosis not present

## 2020-04-14 DIAGNOSIS — N179 Acute kidney failure, unspecified: Secondary | ICD-10-CM | POA: Diagnosis not present

## 2020-04-14 DIAGNOSIS — R52 Pain, unspecified: Secondary | ICD-10-CM | POA: Diagnosis not present

## 2020-04-15 ENCOUNTER — Other Ambulatory Visit: Payer: Self-pay | Admitting: *Deleted

## 2020-04-15 DIAGNOSIS — E118 Type 2 diabetes mellitus with unspecified complications: Secondary | ICD-10-CM | POA: Diagnosis not present

## 2020-04-15 DIAGNOSIS — S72401A Unspecified fracture of lower end of right femur, initial encounter for closed fracture: Secondary | ICD-10-CM | POA: Diagnosis not present

## 2020-04-15 DIAGNOSIS — N178 Other acute kidney failure: Secondary | ICD-10-CM | POA: Diagnosis not present

## 2020-04-15 DIAGNOSIS — A4189 Other specified sepsis: Secondary | ICD-10-CM | POA: Diagnosis not present

## 2020-04-15 DIAGNOSIS — E119 Type 2 diabetes mellitus without complications: Secondary | ICD-10-CM | POA: Diagnosis not present

## 2020-04-15 DIAGNOSIS — S7291XA Unspecified fracture of right femur, initial encounter for closed fracture: Secondary | ICD-10-CM | POA: Diagnosis not present

## 2020-04-15 DIAGNOSIS — E782 Mixed hyperlipidemia: Secondary | ICD-10-CM | POA: Diagnosis not present

## 2020-04-15 DIAGNOSIS — F329 Major depressive disorder, single episode, unspecified: Secondary | ICD-10-CM | POA: Diagnosis not present

## 2020-04-15 DIAGNOSIS — R52 Pain, unspecified: Secondary | ICD-10-CM | POA: Diagnosis not present

## 2020-04-15 DIAGNOSIS — M6281 Muscle weakness (generalized): Secondary | ICD-10-CM | POA: Diagnosis not present

## 2020-04-15 DIAGNOSIS — Z794 Long term (current) use of insulin: Secondary | ICD-10-CM | POA: Diagnosis not present

## 2020-04-15 DIAGNOSIS — I6789 Other cerebrovascular disease: Secondary | ICD-10-CM | POA: Diagnosis not present

## 2020-04-15 DIAGNOSIS — I509 Heart failure, unspecified: Secondary | ICD-10-CM | POA: Diagnosis not present

## 2020-04-15 DIAGNOSIS — I69359 Hemiplegia and hemiparesis following cerebral infarction affecting unspecified side: Secondary | ICD-10-CM | POA: Diagnosis not present

## 2020-04-15 DIAGNOSIS — N390 Urinary tract infection, site not specified: Secondary | ICD-10-CM | POA: Diagnosis not present

## 2020-04-15 DIAGNOSIS — R748 Abnormal levels of other serum enzymes: Secondary | ICD-10-CM | POA: Diagnosis not present

## 2020-04-15 DIAGNOSIS — D6489 Other specified anemias: Secondary | ICD-10-CM | POA: Diagnosis not present

## 2020-04-15 DIAGNOSIS — E876 Hypokalemia: Secondary | ICD-10-CM | POA: Diagnosis not present

## 2020-04-15 DIAGNOSIS — G8111 Spastic hemiplegia affecting right dominant side: Secondary | ICD-10-CM | POA: Diagnosis not present

## 2020-04-15 DIAGNOSIS — I10 Essential (primary) hypertension: Secondary | ICD-10-CM | POA: Diagnosis not present

## 2020-04-15 NOTE — Patient Outreach (Signed)
Member screened for potential [facility] Care Management needs as a benefit of [place] Medicare.  Per Patient Pearletha Forge member resides in [facility].   Communication sent to [facility] SW to collaborate about anticipated dc plans and potential [facility] Care Management needs.   Marthenia Rolling, MSN-Ed, RN,BSN [place] Acute Care Coordinator [phone] [facility]) [phone]  (Toll free office)

## 2020-04-16 ENCOUNTER — Other Ambulatory Visit: Payer: Self-pay | Admitting: *Deleted

## 2020-04-16 DIAGNOSIS — I509 Heart failure, unspecified: Secondary | ICD-10-CM | POA: Diagnosis not present

## 2020-04-16 DIAGNOSIS — E782 Mixed hyperlipidemia: Secondary | ICD-10-CM | POA: Diagnosis not present

## 2020-04-16 DIAGNOSIS — M6281 Muscle weakness (generalized): Secondary | ICD-10-CM | POA: Diagnosis not present

## 2020-04-16 DIAGNOSIS — E119 Type 2 diabetes mellitus without complications: Secondary | ICD-10-CM | POA: Diagnosis not present

## 2020-04-16 DIAGNOSIS — G8111 Spastic hemiplegia affecting right dominant side: Secondary | ICD-10-CM | POA: Diagnosis not present

## 2020-04-16 DIAGNOSIS — R52 Pain, unspecified: Secondary | ICD-10-CM | POA: Diagnosis not present

## 2020-04-16 DIAGNOSIS — S72454D Nondisplaced supracondylar fracture without intracondylar extension of lower end of right femur, subsequent encounter for closed fracture with routine healing: Secondary | ICD-10-CM | POA: Diagnosis not present

## 2020-04-16 DIAGNOSIS — S72401A Unspecified fracture of lower end of right femur, initial encounter for closed fracture: Secondary | ICD-10-CM | POA: Diagnosis not present

## 2020-04-16 DIAGNOSIS — F329 Major depressive disorder, single episode, unspecified: Secondary | ICD-10-CM | POA: Diagnosis not present

## 2020-04-16 DIAGNOSIS — I10 Essential (primary) hypertension: Secondary | ICD-10-CM | POA: Diagnosis not present

## 2020-04-16 NOTE — Patient Outreach (Signed)
[facility] Post- Acute Care Coordinator follow up. Member screened for potential [facility] Care Management needs as a benefit of [place] Medicare.  Update received from [place] indicating transition plan is to return home with family. Has supportive son. Facility currently adjusting pain medications. No anticipated dc date at this time.  Will continue to follow while member resides in SNF.    Marthenia Rolling, MSN-Ed, RN,BSN [place] Acute Care Coordinator [phone] [facility]) [phone]  (Toll free office)

## 2020-04-18 DIAGNOSIS — I509 Heart failure, unspecified: Secondary | ICD-10-CM | POA: Diagnosis not present

## 2020-04-18 DIAGNOSIS — I10 Essential (primary) hypertension: Secondary | ICD-10-CM | POA: Diagnosis not present

## 2020-04-18 DIAGNOSIS — E782 Mixed hyperlipidemia: Secondary | ICD-10-CM | POA: Diagnosis not present

## 2020-04-18 DIAGNOSIS — S72401A Unspecified fracture of lower end of right femur, initial encounter for closed fracture: Secondary | ICD-10-CM | POA: Diagnosis not present

## 2020-04-18 DIAGNOSIS — F329 Major depressive disorder, single episode, unspecified: Secondary | ICD-10-CM | POA: Diagnosis not present

## 2020-04-18 DIAGNOSIS — R52 Pain, unspecified: Secondary | ICD-10-CM | POA: Diagnosis not present

## 2020-04-18 DIAGNOSIS — E119 Type 2 diabetes mellitus without complications: Secondary | ICD-10-CM | POA: Diagnosis not present

## 2020-04-18 DIAGNOSIS — G8111 Spastic hemiplegia affecting right dominant side: Secondary | ICD-10-CM | POA: Diagnosis not present

## 2020-04-18 DIAGNOSIS — M6281 Muscle weakness (generalized): Secondary | ICD-10-CM | POA: Diagnosis not present

## 2020-04-24 DIAGNOSIS — E119 Type 2 diabetes mellitus without complications: Secondary | ICD-10-CM | POA: Diagnosis not present

## 2020-04-24 DIAGNOSIS — G8111 Spastic hemiplegia affecting right dominant side: Secondary | ICD-10-CM | POA: Diagnosis not present

## 2020-04-24 DIAGNOSIS — I509 Heart failure, unspecified: Secondary | ICD-10-CM | POA: Diagnosis not present

## 2020-04-24 DIAGNOSIS — E782 Mixed hyperlipidemia: Secondary | ICD-10-CM | POA: Diagnosis not present

## 2020-04-24 DIAGNOSIS — M6281 Muscle weakness (generalized): Secondary | ICD-10-CM | POA: Diagnosis not present

## 2020-04-24 DIAGNOSIS — S72401A Unspecified fracture of lower end of right femur, initial encounter for closed fracture: Secondary | ICD-10-CM | POA: Diagnosis not present

## 2020-04-24 DIAGNOSIS — R52 Pain, unspecified: Secondary | ICD-10-CM | POA: Diagnosis not present

## 2020-04-24 DIAGNOSIS — F329 Major depressive disorder, single episode, unspecified: Secondary | ICD-10-CM | POA: Diagnosis not present

## 2020-04-24 DIAGNOSIS — I10 Essential (primary) hypertension: Secondary | ICD-10-CM | POA: Diagnosis not present

## 2020-04-28 DIAGNOSIS — E782 Mixed hyperlipidemia: Secondary | ICD-10-CM | POA: Diagnosis not present

## 2020-04-28 DIAGNOSIS — S72401A Unspecified fracture of lower end of right femur, initial encounter for closed fracture: Secondary | ICD-10-CM | POA: Diagnosis not present

## 2020-04-28 DIAGNOSIS — F329 Major depressive disorder, single episode, unspecified: Secondary | ICD-10-CM | POA: Diagnosis not present

## 2020-04-28 DIAGNOSIS — I509 Heart failure, unspecified: Secondary | ICD-10-CM | POA: Diagnosis not present

## 2020-04-28 DIAGNOSIS — G8111 Spastic hemiplegia affecting right dominant side: Secondary | ICD-10-CM | POA: Diagnosis not present

## 2020-04-28 DIAGNOSIS — E119 Type 2 diabetes mellitus without complications: Secondary | ICD-10-CM | POA: Diagnosis not present

## 2020-04-28 DIAGNOSIS — I10 Essential (primary) hypertension: Secondary | ICD-10-CM | POA: Diagnosis not present

## 2020-04-28 DIAGNOSIS — R52 Pain, unspecified: Secondary | ICD-10-CM | POA: Diagnosis not present

## 2020-04-28 DIAGNOSIS — M6281 Muscle weakness (generalized): Secondary | ICD-10-CM | POA: Diagnosis not present

## 2020-04-30 DIAGNOSIS — E782 Mixed hyperlipidemia: Secondary | ICD-10-CM | POA: Diagnosis not present

## 2020-04-30 DIAGNOSIS — S72401A Unspecified fracture of lower end of right femur, initial encounter for closed fracture: Secondary | ICD-10-CM | POA: Diagnosis not present

## 2020-04-30 DIAGNOSIS — F329 Major depressive disorder, single episode, unspecified: Secondary | ICD-10-CM | POA: Diagnosis not present

## 2020-04-30 DIAGNOSIS — E119 Type 2 diabetes mellitus without complications: Secondary | ICD-10-CM | POA: Diagnosis not present

## 2020-04-30 DIAGNOSIS — G8111 Spastic hemiplegia affecting right dominant side: Secondary | ICD-10-CM | POA: Diagnosis not present

## 2020-04-30 DIAGNOSIS — M6281 Muscle weakness (generalized): Secondary | ICD-10-CM | POA: Diagnosis not present

## 2020-04-30 DIAGNOSIS — R52 Pain, unspecified: Secondary | ICD-10-CM | POA: Diagnosis not present

## 2020-04-30 DIAGNOSIS — I509 Heart failure, unspecified: Secondary | ICD-10-CM | POA: Diagnosis not present

## 2020-04-30 DIAGNOSIS — I10 Essential (primary) hypertension: Secondary | ICD-10-CM | POA: Diagnosis not present

## 2020-05-01 DIAGNOSIS — R634 Abnormal weight loss: Secondary | ICD-10-CM | POA: Diagnosis not present

## 2020-05-01 DIAGNOSIS — I10 Essential (primary) hypertension: Secondary | ICD-10-CM | POA: Diagnosis not present

## 2020-05-01 DIAGNOSIS — E118 Type 2 diabetes mellitus with unspecified complications: Secondary | ICD-10-CM | POA: Diagnosis not present

## 2020-05-01 DIAGNOSIS — G40909 Epilepsy, unspecified, not intractable, without status epilepticus: Secondary | ICD-10-CM | POA: Diagnosis not present

## 2020-05-02 DIAGNOSIS — R52 Pain, unspecified: Secondary | ICD-10-CM | POA: Diagnosis not present

## 2020-05-02 DIAGNOSIS — F329 Major depressive disorder, single episode, unspecified: Secondary | ICD-10-CM | POA: Diagnosis not present

## 2020-05-02 DIAGNOSIS — I10 Essential (primary) hypertension: Secondary | ICD-10-CM | POA: Diagnosis not present

## 2020-05-02 DIAGNOSIS — M6281 Muscle weakness (generalized): Secondary | ICD-10-CM | POA: Diagnosis not present

## 2020-05-02 DIAGNOSIS — G8111 Spastic hemiplegia affecting right dominant side: Secondary | ICD-10-CM | POA: Diagnosis not present

## 2020-05-02 DIAGNOSIS — S72401A Unspecified fracture of lower end of right femur, initial encounter for closed fracture: Secondary | ICD-10-CM | POA: Diagnosis not present

## 2020-05-02 DIAGNOSIS — I509 Heart failure, unspecified: Secondary | ICD-10-CM | POA: Diagnosis not present

## 2020-05-02 DIAGNOSIS — E119 Type 2 diabetes mellitus without complications: Secondary | ICD-10-CM | POA: Diagnosis not present

## 2020-05-02 DIAGNOSIS — E782 Mixed hyperlipidemia: Secondary | ICD-10-CM | POA: Diagnosis not present

## 2020-05-05 DIAGNOSIS — I509 Heart failure, unspecified: Secondary | ICD-10-CM | POA: Diagnosis not present

## 2020-05-05 DIAGNOSIS — E119 Type 2 diabetes mellitus without complications: Secondary | ICD-10-CM | POA: Diagnosis not present

## 2020-05-05 DIAGNOSIS — S72401A Unspecified fracture of lower end of right femur, initial encounter for closed fracture: Secondary | ICD-10-CM | POA: Diagnosis not present

## 2020-05-05 DIAGNOSIS — E782 Mixed hyperlipidemia: Secondary | ICD-10-CM | POA: Diagnosis not present

## 2020-05-05 DIAGNOSIS — M6281 Muscle weakness (generalized): Secondary | ICD-10-CM | POA: Diagnosis not present

## 2020-05-05 DIAGNOSIS — F329 Major depressive disorder, single episode, unspecified: Secondary | ICD-10-CM | POA: Diagnosis not present

## 2020-05-05 DIAGNOSIS — R52 Pain, unspecified: Secondary | ICD-10-CM | POA: Diagnosis not present

## 2020-05-05 DIAGNOSIS — I10 Essential (primary) hypertension: Secondary | ICD-10-CM | POA: Diagnosis not present

## 2020-05-05 DIAGNOSIS — G8111 Spastic hemiplegia affecting right dominant side: Secondary | ICD-10-CM | POA: Diagnosis not present

## 2020-05-06 ENCOUNTER — Other Ambulatory Visit: Payer: Self-pay | Admitting: *Deleted

## 2020-05-06 DIAGNOSIS — S7291XD Unspecified fracture of right femur, subsequent encounter for closed fracture with routine healing: Secondary | ICD-10-CM | POA: Diagnosis not present

## 2020-05-06 DIAGNOSIS — I10 Essential (primary) hypertension: Secondary | ICD-10-CM | POA: Diagnosis not present

## 2020-05-06 DIAGNOSIS — G40909 Epilepsy, unspecified, not intractable, without status epilepticus: Secondary | ICD-10-CM | POA: Diagnosis not present

## 2020-05-06 DIAGNOSIS — E118 Type 2 diabetes mellitus with unspecified complications: Secondary | ICD-10-CM | POA: Diagnosis not present

## 2020-05-06 NOTE — Patient Outreach (Signed)
[facility] Post- Acute Care Coordinator follow up. Member screened for potential [facility] Care Management needs as a benefit of [place] Medicare.  Verified in Patient Pearletha Forge that member resides in [facility].   Communication sent to SNF SW to inquire about transition plans and potential [facility] Care Management needs.   Will continue to follow while member resides in SNF.    Marthenia Rolling, MSN-Ed, RN,BSN [place] Acute Care Coordinator [phone] [facility])

## 2020-05-07 ENCOUNTER — Other Ambulatory Visit: Payer: Self-pay | Admitting: *Deleted

## 2020-05-07 NOTE — Patient Outreach (Signed)
[facility] Post- Acute Care Coordinator follow up. Member screened for potential [facility] Care Management needs as a benefit of [place] Medicare.  [name] resides in [facility]. Facility SW indicates member continues to work with therapy. No transition date set. Transition plan to return home.   Will continue to follow for transition plans and potential [facility] Care Management needs while member resides in SNF.    Melissa Rolling, MSN-Ed, RN,BSN [place] Acute Care Coordinator [phone] [facility]) [phone]  (Toll free office)

## 2020-05-08 DIAGNOSIS — M6281 Muscle weakness (generalized): Secondary | ICD-10-CM | POA: Diagnosis not present

## 2020-05-08 DIAGNOSIS — I509 Heart failure, unspecified: Secondary | ICD-10-CM | POA: Diagnosis not present

## 2020-05-08 DIAGNOSIS — I10 Essential (primary) hypertension: Secondary | ICD-10-CM | POA: Diagnosis not present

## 2020-05-08 DIAGNOSIS — R52 Pain, unspecified: Secondary | ICD-10-CM | POA: Diagnosis not present

## 2020-05-08 DIAGNOSIS — G8111 Spastic hemiplegia affecting right dominant side: Secondary | ICD-10-CM | POA: Diagnosis not present

## 2020-05-08 DIAGNOSIS — F329 Major depressive disorder, single episode, unspecified: Secondary | ICD-10-CM | POA: Diagnosis not present

## 2020-05-08 DIAGNOSIS — S72401A Unspecified fracture of lower end of right femur, initial encounter for closed fracture: Secondary | ICD-10-CM | POA: Diagnosis not present

## 2020-05-08 DIAGNOSIS — E119 Type 2 diabetes mellitus without complications: Secondary | ICD-10-CM | POA: Diagnosis not present

## 2020-05-08 DIAGNOSIS — E782 Mixed hyperlipidemia: Secondary | ICD-10-CM | POA: Diagnosis not present

## 2020-05-12 DIAGNOSIS — S72454D Nondisplaced supracondylar fracture without intracondylar extension of lower end of right femur, subsequent encounter for closed fracture with routine healing: Secondary | ICD-10-CM | POA: Diagnosis not present

## 2020-05-13 DIAGNOSIS — I509 Heart failure, unspecified: Secondary | ICD-10-CM | POA: Diagnosis not present

## 2020-05-13 DIAGNOSIS — F329 Major depressive disorder, single episode, unspecified: Secondary | ICD-10-CM | POA: Diagnosis not present

## 2020-05-13 DIAGNOSIS — M6281 Muscle weakness (generalized): Secondary | ICD-10-CM | POA: Diagnosis not present

## 2020-05-13 DIAGNOSIS — S72401A Unspecified fracture of lower end of right femur, initial encounter for closed fracture: Secondary | ICD-10-CM | POA: Diagnosis not present

## 2020-05-13 DIAGNOSIS — I10 Essential (primary) hypertension: Secondary | ICD-10-CM | POA: Diagnosis not present

## 2020-05-13 DIAGNOSIS — E782 Mixed hyperlipidemia: Secondary | ICD-10-CM | POA: Diagnosis not present

## 2020-05-13 DIAGNOSIS — R52 Pain, unspecified: Secondary | ICD-10-CM | POA: Diagnosis not present

## 2020-05-13 DIAGNOSIS — G8111 Spastic hemiplegia affecting right dominant side: Secondary | ICD-10-CM | POA: Diagnosis not present

## 2020-05-13 DIAGNOSIS — E119 Type 2 diabetes mellitus without complications: Secondary | ICD-10-CM | POA: Diagnosis not present

## 2020-05-16 DIAGNOSIS — I509 Heart failure, unspecified: Secondary | ICD-10-CM | POA: Diagnosis not present

## 2020-05-16 DIAGNOSIS — M6281 Muscle weakness (generalized): Secondary | ICD-10-CM | POA: Diagnosis not present

## 2020-05-16 DIAGNOSIS — F329 Major depressive disorder, single episode, unspecified: Secondary | ICD-10-CM | POA: Diagnosis not present

## 2020-05-16 DIAGNOSIS — E782 Mixed hyperlipidemia: Secondary | ICD-10-CM | POA: Diagnosis not present

## 2020-05-16 DIAGNOSIS — S72401A Unspecified fracture of lower end of right femur, initial encounter for closed fracture: Secondary | ICD-10-CM | POA: Diagnosis not present

## 2020-05-16 DIAGNOSIS — R52 Pain, unspecified: Secondary | ICD-10-CM | POA: Diagnosis not present

## 2020-05-16 DIAGNOSIS — E119 Type 2 diabetes mellitus without complications: Secondary | ICD-10-CM | POA: Diagnosis not present

## 2020-05-16 DIAGNOSIS — G8111 Spastic hemiplegia affecting right dominant side: Secondary | ICD-10-CM | POA: Diagnosis not present

## 2020-05-16 DIAGNOSIS — I10 Essential (primary) hypertension: Secondary | ICD-10-CM | POA: Diagnosis not present

## 2020-05-19 DIAGNOSIS — A498 Other bacterial infections of unspecified site: Secondary | ICD-10-CM | POA: Diagnosis not present

## 2020-05-19 DIAGNOSIS — I509 Heart failure, unspecified: Secondary | ICD-10-CM | POA: Diagnosis not present

## 2020-05-19 DIAGNOSIS — I10 Essential (primary) hypertension: Secondary | ICD-10-CM | POA: Diagnosis not present

## 2020-05-19 DIAGNOSIS — S7291XD Unspecified fracture of right femur, subsequent encounter for closed fracture with routine healing: Secondary | ICD-10-CM | POA: Diagnosis not present

## 2020-05-19 DIAGNOSIS — E782 Mixed hyperlipidemia: Secondary | ICD-10-CM | POA: Diagnosis not present

## 2020-05-19 DIAGNOSIS — G8111 Spastic hemiplegia affecting right dominant side: Secondary | ICD-10-CM | POA: Diagnosis not present

## 2020-05-19 DIAGNOSIS — E119 Type 2 diabetes mellitus without complications: Secondary | ICD-10-CM | POA: Diagnosis not present

## 2020-05-19 DIAGNOSIS — R52 Pain, unspecified: Secondary | ICD-10-CM | POA: Diagnosis not present

## 2020-05-19 DIAGNOSIS — M6281 Muscle weakness (generalized): Secondary | ICD-10-CM | POA: Diagnosis not present

## 2020-05-19 DIAGNOSIS — S72401A Unspecified fracture of lower end of right femur, initial encounter for closed fracture: Secondary | ICD-10-CM | POA: Diagnosis not present

## 2020-05-19 DIAGNOSIS — Z1612 Extended spectrum beta lactamase (ESBL) resistance: Secondary | ICD-10-CM | POA: Diagnosis not present

## 2020-05-19 DIAGNOSIS — F329 Major depressive disorder, single episode, unspecified: Secondary | ICD-10-CM | POA: Diagnosis not present

## 2020-05-21 DIAGNOSIS — E119 Type 2 diabetes mellitus without complications: Secondary | ICD-10-CM | POA: Diagnosis not present

## 2020-05-21 DIAGNOSIS — S72401A Unspecified fracture of lower end of right femur, initial encounter for closed fracture: Secondary | ICD-10-CM | POA: Diagnosis not present

## 2020-05-21 DIAGNOSIS — G8111 Spastic hemiplegia affecting right dominant side: Secondary | ICD-10-CM | POA: Diagnosis not present

## 2020-05-21 DIAGNOSIS — I10 Essential (primary) hypertension: Secondary | ICD-10-CM | POA: Diagnosis not present

## 2020-05-21 DIAGNOSIS — R52 Pain, unspecified: Secondary | ICD-10-CM | POA: Diagnosis not present

## 2020-05-21 DIAGNOSIS — M6281 Muscle weakness (generalized): Secondary | ICD-10-CM | POA: Diagnosis not present

## 2020-05-21 DIAGNOSIS — F329 Major depressive disorder, single episode, unspecified: Secondary | ICD-10-CM | POA: Diagnosis not present

## 2020-05-21 DIAGNOSIS — I509 Heart failure, unspecified: Secondary | ICD-10-CM | POA: Diagnosis not present

## 2020-05-21 DIAGNOSIS — E782 Mixed hyperlipidemia: Secondary | ICD-10-CM | POA: Diagnosis not present

## 2020-05-22 ENCOUNTER — Other Ambulatory Visit: Payer: Self-pay | Admitting: *Deleted

## 2020-05-22 DIAGNOSIS — S72401A Unspecified fracture of lower end of right femur, initial encounter for closed fracture: Secondary | ICD-10-CM | POA: Diagnosis not present

## 2020-05-22 DIAGNOSIS — F329 Major depressive disorder, single episode, unspecified: Secondary | ICD-10-CM | POA: Diagnosis not present

## 2020-05-22 DIAGNOSIS — R52 Pain, unspecified: Secondary | ICD-10-CM | POA: Diagnosis not present

## 2020-05-22 DIAGNOSIS — T148XXA Other injury of unspecified body region, initial encounter: Secondary | ICD-10-CM | POA: Diagnosis not present

## 2020-05-22 DIAGNOSIS — R634 Abnormal weight loss: Secondary | ICD-10-CM | POA: Diagnosis not present

## 2020-05-22 DIAGNOSIS — I10 Essential (primary) hypertension: Secondary | ICD-10-CM | POA: Diagnosis not present

## 2020-05-22 DIAGNOSIS — M6281 Muscle weakness (generalized): Secondary | ICD-10-CM | POA: Diagnosis not present

## 2020-05-22 DIAGNOSIS — R600 Localized edema: Secondary | ICD-10-CM | POA: Diagnosis not present

## 2020-05-22 DIAGNOSIS — E119 Type 2 diabetes mellitus without complications: Secondary | ICD-10-CM | POA: Diagnosis not present

## 2020-05-22 DIAGNOSIS — I509 Heart failure, unspecified: Secondary | ICD-10-CM | POA: Diagnosis not present

## 2020-05-22 DIAGNOSIS — G8111 Spastic hemiplegia affecting right dominant side: Secondary | ICD-10-CM | POA: Diagnosis not present

## 2020-05-22 DIAGNOSIS — Z794 Long term (current) use of insulin: Secondary | ICD-10-CM | POA: Diagnosis not present

## 2020-05-22 DIAGNOSIS — E782 Mixed hyperlipidemia: Secondary | ICD-10-CM | POA: Diagnosis not present

## 2020-05-22 DIAGNOSIS — E118 Type 2 diabetes mellitus with unspecified complications: Secondary | ICD-10-CM | POA: Diagnosis not present

## 2020-05-22 NOTE — Patient Outreach (Signed)
[facility] Post-Acute Care Coordinator follow up. Member screened for potential [facility] Care Management needs as a benefit of [employer].  Verified in Patient Ping that member remains in [facility].   Communication sent to [facility] SWs to request update on transition plans.   Will continue to follow transition plans and potential [facility] Care Management needs while member resides in SNF.   Hiep Ollis, MSN, RN,BSN [facility] Post Acute Care Coordinator [phone] ( [employer]) [phone]  (Toll free office)  

## 2020-05-26 ENCOUNTER — Encounter: Payer: Self-pay | Admitting: Neurology

## 2020-05-26 ENCOUNTER — Ambulatory Visit: Payer: Self-pay | Admitting: Neurology

## 2020-05-26 DIAGNOSIS — E119 Type 2 diabetes mellitus without complications: Secondary | ICD-10-CM | POA: Diagnosis not present

## 2020-05-26 DIAGNOSIS — R52 Pain, unspecified: Secondary | ICD-10-CM | POA: Diagnosis not present

## 2020-05-26 DIAGNOSIS — S72401A Unspecified fracture of lower end of right femur, initial encounter for closed fracture: Secondary | ICD-10-CM | POA: Diagnosis not present

## 2020-05-26 DIAGNOSIS — I509 Heart failure, unspecified: Secondary | ICD-10-CM | POA: Diagnosis not present

## 2020-05-26 DIAGNOSIS — G8111 Spastic hemiplegia affecting right dominant side: Secondary | ICD-10-CM | POA: Diagnosis not present

## 2020-05-26 DIAGNOSIS — F329 Major depressive disorder, single episode, unspecified: Secondary | ICD-10-CM | POA: Diagnosis not present

## 2020-05-26 DIAGNOSIS — I10 Essential (primary) hypertension: Secondary | ICD-10-CM | POA: Diagnosis not present

## 2020-05-26 DIAGNOSIS — M6281 Muscle weakness (generalized): Secondary | ICD-10-CM | POA: Diagnosis not present

## 2020-05-26 DIAGNOSIS — E782 Mixed hyperlipidemia: Secondary | ICD-10-CM | POA: Diagnosis not present

## 2020-05-26 NOTE — Progress Notes (Deleted)
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 2017, presented with acute onset slurred speech, right leg and arm weakness,    She was found to have patent foramina ovale by TEE in October 2016, was put on Coumadin as stroke prevention  She also suffered right hip fracture require surgery in June 12 2015, She now lives at home with her son [name]  Since stroke, she suffered severe right shoulder pain, right shoulder tightness, limited range of motion, there was also intermittent confusion word finding difficulties more than her baseline level, but there was never seizure activity noted  X-ray of her right shoulder in January 2017 showed no significant abnormality  Previous EEG in May 2017 showed mild background slowing, there was no evidence of epileptiform discharge. Laboratory evaluation in 2017 A1c 8.4, frequent UTIs, INR 1.33-2.5, she supposed to take Coumadin, CMP showed elevated glucose 194, creatinine 0.91,   She begin to receive EMG guided botulism toxin injection for spastic right hemiparesis since April 2018, which has helped relax her right shoulder, last injection was in January 2019,  On December 03, 2017, she was noted by her son [name] slumped over, foaming, out of the mouth, right side body jerking movement, unresponsive, followed by extreme confused, fatigue afterwards,  MRI of brain on December 03, 2017 showed no acute abnormality, old infarction in the left anterior cerebral artery territory, smaller old infarction in the right anterior cerebral artery,  CMP showed hemoglobin of 16,  creatinine of 0.7, glucose of 169  UPDATE August 4th 2020: She lives with her son, has no recurrent seizure, continue has right shoulder arm pain, has missed her botulism toxin injection, need refill of her medication today   Update May 26, 2020 SS:   REVIEW OF SYSTEMS: Out of a complete 14 system review of symptoms, the patient complains only of the following symptoms, and all other reviewed systems are negative.  ALLERGIES: Allergies  Allergen Reactions  . Pork-Derived Products Other (See Comments)    To keep blood pressure down    HOME MEDICATIONS: Outpatient Medications Prior to Visit  Medication Sig Dispense Refill  . atorvastatin (LIPITOR) 40 MG tablet Take 0.5 tablets (20 mg total) by mouth daily.    . baclofen (LIORESAL) 10 MG tablet Take 1 tablet (10 mg total) by mouth 3 (three) times daily. (Patient not taking: Reported on 04/05/2020) 270 tablet 4  . ELIQUIS 5 MG TABS tablet Take 5 mg by mouth 2 (two) times daily.     Marland Kitchen gabapentin (NEURONTIN) 300 MG capsule Take 1 capsule (300 mg total) by mouth 3 (three) times daily. (Patient not taking: Reported on 04/05/2020) 270 capsule 4  . insulin aspart (NOVOLOG) 100 UNIT/ML injection Inject 0-15 Units into the skin 3 (three) times daily with meals. 10 mL 0  . insulin detemir (LEVEMIR) 100 UNIT/ML injection Inject 0.05 mLs (5 Units total) into the skin daily.    Marland Kitchen levETIRAcetam (KEPPRA) 500 MG tablet Take 1 tablet (500 mg total) by mouth 2 (two) times daily. 180 tablet 0  . losartan (COZAAR) 25 MG tablet Take 12.5 mg by mouth daily.     Marland Kitchen  metoprolol succinate (TOPROL-XL) 25 MG 24 hr tablet Take 25 mg by mouth daily.     . traMADol (ULTRAM) 50 MG tablet Take 1 tablet (50 mg total) by mouth every 6 (six) hours as needed for moderate pain. 14 tablet 0   No facility-administered medications prior to visit.    PAST MEDICAL HISTORY: Past Medical History:  Diagnosis Date  . Acute blood loss anemia   . CHF (congestive heart failure)  (HCC)    EF 99-83%, grade 1 diastolic dysfunction per echo 04/2015  . 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 CVA (cerebral infarction) 04/2015   Started Plavix 04/2015  . Depression   . Diabetes ([place]) 2016   Type II. On insulin  . Enchondroma of bone 2011   left femur.   . Fracture, intertrochanteric, right femur ([place])   . History of lower GI bleeding   . Hyperlipidemia   . Hypertension   . Patent foramen ovale 04/2015   Started on warfarin 04/2015  . Protein calorie malnutrition ([place])   . Stercoral ulcer of rectum 05/16/2015    PAST SURGICAL HISTORY: Past Surgical History:  Procedure Laterality Date  . CARDIAC SURGERY     Cath without stent  . COLONOSCOPY N/A 05/15/2015   Procedure: COLONOSCOPY;  Surgeon: Mauri Pole, MD;  Location[facility] ENDOSCOPY;  Service: Endoscopy;  Laterality: N/A;  . FLEXIBLE SIGMOIDOSCOPY N/A 05/15/2015   Procedure: FLEXIBLE SIGMOIDOSCOPY;  Surgeon: Mauri Pole, MD;  Location: [facility] ENDOSCOPY;  Service: Endoscopy;  Laterality: N/A;  at bedside  . INTRAMEDULLARY (IM) NAIL INTERTROCHANTERIC Right 06/12/2015   Procedure: INTRAMEDULLARY (IM) NAIL RIGHT HIP;  Surgeon: Renette Butters, MD;  Location: [facility];  Service: Orthopedics;  Laterality: Right;  . TEE WITHOUT CARDIOVERSION N/A 05/05/2015   Procedure: TRANSESOPHAGEAL ECHOCARDIOGRAM (TEE);  Surgeon: Dixie Dials, MD;  Location: [facility] ENDOSCOPY;  Service: Cardiovascular;  Laterality: N/A;    FAMILY HISTORY: Family History  Problem Relation Age of Onset  . Hypertension Mother   . Heart failure Mother   . Hyperlipidemia Mother   . Heart attack Father     SOCIAL HISTORY: Social History   Socioeconomic History  . Marital status: Married    Spouse name: Not on file  . Number of children: 6  . Years of education: 25  . Highest education level: Not on file  Occupational History  . Occupation: Retired  Tobacco Use  . Smoking status:  Former Smoker    Types: Cigarettes    Quit date: 04/05/2015    Years since quitting: 5.1  . Smokeless tobacco: Never Used  . Tobacco comment: Quit 04/28/15  Vaping Use  . Vaping Use: Never used  Substance and Sexual Activity  . Alcohol use: No    Alcohol/week: 0.0 standard drinks  . Drug use: No  . Sexual activity: Not on file  Other Topics Concern  . Not on file  Social History Narrative   She will be living with her two sons.\   Right-handed.   No caffeine use.   Social Determinants of Health   Financial Resource Strain:   . Difficulty of Paying Living Expenses: Not on file  Food Insecurity:   . Worried About Charity fundraiser in the Last Year: Not on file  . Ran Out of Food in the Last Year: Not on file  Transportation Needs:   . Lack of Transportation (Medical): Not on file  . Lack of Transportation (Non-Medical):  Not on file  Physical Activity:   . Days of Exercise per Week: Not on file  . Minutes of Exercise per Session: Not on file  Stress:   . Feeling of Stress : Not on file  Social Connections:   . Frequency of Communication with Friends and Family: Not on file  . Frequency of Social Gatherings with Friends and Family: Not on file  . Attends Religious Services: Not on file  . Active Member of Clubs or Organizations: Not on file  . Attends Archivist Meetings: Not on file  . Marital Status: Not on file  Intimate Partner Violence:   . Fear of Current or Ex-Partner: Not on file  . Emotionally Abused: Not on file  . Physically Abused: Not on file  . Sexually Abused: Not on file      PHYSICAL EXAM  There were no vitals filed for this visit. There is no height or weight on file to calculate BMI.  Generalized: Well developed, in no acute distress   Neurological examination  Mentation: Alert oriented to time, place, history taking. Follows all commands speech and language fluent Cranial nerve II-XII: Pupils were equal round reactive to light.  Extraocular movements were full, visual field were full on confrontational test. Facial sensation and strength were normal. Uvula tongue midline. Head turning and shoulder shrug  were normal and symmetric. Motor: The motor testing reveals 5 over 5 strength of all 4 extremities. Good symmetric motor tone is noted throughout.  Sensory: Sensory testing is intact to soft touch on all 4 extremities. No evidence of extinction is noted.  Coordination: Cerebellar testing reveals good finger-nose-finger and heel-to-shin bilaterally.  Gait and station: Gait is normal. Tandem gait is normal. Romberg is negative. No drift is seen.  Reflexes: Deep tendon reflexes are symmetric and normal bilaterally.   DIAGNOSTIC DATA (LABS, IMAGING, TESTING) - I reviewed patient records, labs, notes, testing and imaging myself where available.  Lab Results  Component Value Date   WBC 10.2 04/09/2020   HGB 9.1 (L) 04/09/2020   HCT 28.7 (L) 04/09/2020   MCV 88.9 04/09/2020   PLT 164 04/09/2020      Component Value Date/Time   NA 139 04/10/2020 1055   NA 140 06/18/2015 0000   K 4.2 04/10/2020 1055   CL 107 04/10/2020 1055   CO2 22 04/10/2020 1055   GLUCOSE 236 (H) 04/10/2020 1055   BUN 7 (L) 04/10/2020 1055   BUN 12 06/18/2015 0000   CREATININE 0.77 04/10/2020 1055   CALCIUM 8.2 (L) 04/10/2020 1055   PROT 4.8 (L) 04/07/2020 0216   ALBUMIN 2.5 (L) 04/08/2020 1050   AST 27 04/07/2020 0216   ALT 23 04/07/2020 0216   ALKPHOS 47 04/07/2020 0216   BILITOT 0.6 04/07/2020 0216   GFRNONAA >60 04/10/2020 1055   GFRAA >60 04/07/2020 0216   Lab Results  Component Value Date   CHOL 128 03/20/2020   HDL 42.90 03/20/2020   LDLCALC 70 08/18/2018   LDLDIRECT 59.0 03/20/2020   TRIG 219.0 (H) 03/20/2020   CHOLHDL 3 03/20/2020   Lab Results  Component Value Date   HGBA1C 12.0 (H) 03/20/2020   Lab Results  Component Value Date   VITAMINB12 245 08/18/2018   Lab Results  Component Value Date   TSH 1.73 03/20/2020       ASSESSMENT AND PLAN 74 y.o. year old female  has a past medical history of Acute blood loss anemia, CHF (congestive heart failure) ([place]), Coronary artery disease  involving native artery of transplanted heart without angina pectoris, CVA (cerebral infarction) (04/2015), Depression, Diabetes ([place]) (2016), Enchondroma of bone (2011), Fracture, intertrochanteric, right femur ([place]), History of lower GI bleeding, Hyperlipidemia, Hypertension, Patent foramen ovale (04/2015), Protein calorie malnutrition ([place]), and Stercoral ulcer of rectum (05/16/2015). here with:  1. Complex partial seizure -Evidence of left frontal encephalomalacia  -Continue Keppra 500 mg twice a day  2. Vitamin D deficiency -Continue vitamin D supplement  3. Left ACA stroke, left A2 occlusion with residual spastic right hemiparesis, right shoulder pain -Evidence of patient foraminal ovale, on Coumadin   I spent 15 minutes with the patient. 50% of this time was spent   Butler Denmark, [place], DNP 05/26/2020, 5:55 AM [facility] [address] [place], [place] [address] [phone]

## 2020-05-27 DIAGNOSIS — L8996 Pressure-induced deep tissue damage of unspecified site: Secondary | ICD-10-CM | POA: Diagnosis not present

## 2020-06-02 ENCOUNTER — Telehealth: Payer: Self-pay | Admitting: Neurology

## 2020-06-02 DIAGNOSIS — F329 Major depressive disorder, single episode, unspecified: Secondary | ICD-10-CM | POA: Diagnosis not present

## 2020-06-02 DIAGNOSIS — R52 Pain, unspecified: Secondary | ICD-10-CM | POA: Diagnosis not present

## 2020-06-02 DIAGNOSIS — S72401A Unspecified fracture of lower end of right femur, initial encounter for closed fracture: Secondary | ICD-10-CM | POA: Diagnosis not present

## 2020-06-02 DIAGNOSIS — M6281 Muscle weakness (generalized): Secondary | ICD-10-CM | POA: Diagnosis not present

## 2020-06-02 DIAGNOSIS — I509 Heart failure, unspecified: Secondary | ICD-10-CM | POA: Diagnosis not present

## 2020-06-02 DIAGNOSIS — I10 Essential (primary) hypertension: Secondary | ICD-10-CM | POA: Diagnosis not present

## 2020-06-02 DIAGNOSIS — G8111 Spastic hemiplegia affecting right dominant side: Secondary | ICD-10-CM | POA: Diagnosis not present

## 2020-06-02 DIAGNOSIS — E119 Type 2 diabetes mellitus without complications: Secondary | ICD-10-CM | POA: Diagnosis not present

## 2020-06-02 DIAGNOSIS — E782 Mixed hyperlipidemia: Secondary | ICD-10-CM | POA: Diagnosis not present

## 2020-06-02 NOTE — Telephone Encounter (Signed)
noted 

## 2020-06-02 NOTE — Telephone Encounter (Signed)
FYI Pt's son, [name] (on [place]) called wanted you to know why she missed her appt on 11/22. She has a broken leg and in rehab right now.

## 2020-06-03 DIAGNOSIS — L89890 Pressure ulcer of other site, unstageable: Secondary | ICD-10-CM | POA: Diagnosis not present

## 2020-06-03 DIAGNOSIS — Z794 Long term (current) use of insulin: Secondary | ICD-10-CM | POA: Diagnosis not present

## 2020-06-03 DIAGNOSIS — R269 Unspecified abnormalities of gait and mobility: Secondary | ICD-10-CM | POA: Diagnosis not present

## 2020-06-03 DIAGNOSIS — E118 Type 2 diabetes mellitus with unspecified complications: Secondary | ICD-10-CM | POA: Diagnosis not present

## 2020-06-03 DIAGNOSIS — I10 Essential (primary) hypertension: Secondary | ICD-10-CM | POA: Diagnosis not present

## 2020-06-04 DIAGNOSIS — G8111 Spastic hemiplegia affecting right dominant side: Secondary | ICD-10-CM | POA: Diagnosis not present

## 2020-06-04 DIAGNOSIS — E782 Mixed hyperlipidemia: Secondary | ICD-10-CM | POA: Diagnosis not present

## 2020-06-04 DIAGNOSIS — F329 Major depressive disorder, single episode, unspecified: Secondary | ICD-10-CM | POA: Diagnosis not present

## 2020-06-04 DIAGNOSIS — M6281 Muscle weakness (generalized): Secondary | ICD-10-CM | POA: Diagnosis not present

## 2020-06-04 DIAGNOSIS — S72401A Unspecified fracture of lower end of right femur, initial encounter for closed fracture: Secondary | ICD-10-CM | POA: Diagnosis not present

## 2020-06-04 DIAGNOSIS — E119 Type 2 diabetes mellitus without complications: Secondary | ICD-10-CM | POA: Diagnosis not present

## 2020-06-04 DIAGNOSIS — I509 Heart failure, unspecified: Secondary | ICD-10-CM | POA: Diagnosis not present

## 2020-06-04 DIAGNOSIS — R52 Pain, unspecified: Secondary | ICD-10-CM | POA: Diagnosis not present

## 2020-06-04 DIAGNOSIS — I10 Essential (primary) hypertension: Secondary | ICD-10-CM | POA: Diagnosis not present

## 2020-06-06 DIAGNOSIS — E782 Mixed hyperlipidemia: Secondary | ICD-10-CM | POA: Diagnosis not present

## 2020-06-06 DIAGNOSIS — M6281 Muscle weakness (generalized): Secondary | ICD-10-CM | POA: Diagnosis not present

## 2020-06-06 DIAGNOSIS — G8111 Spastic hemiplegia affecting right dominant side: Secondary | ICD-10-CM | POA: Diagnosis not present

## 2020-06-06 DIAGNOSIS — F329 Major depressive disorder, single episode, unspecified: Secondary | ICD-10-CM | POA: Diagnosis not present

## 2020-06-06 DIAGNOSIS — S72401A Unspecified fracture of lower end of right femur, initial encounter for closed fracture: Secondary | ICD-10-CM | POA: Diagnosis not present

## 2020-06-06 DIAGNOSIS — I509 Heart failure, unspecified: Secondary | ICD-10-CM | POA: Diagnosis not present

## 2020-06-06 DIAGNOSIS — I10 Essential (primary) hypertension: Secondary | ICD-10-CM | POA: Diagnosis not present

## 2020-06-06 DIAGNOSIS — E119 Type 2 diabetes mellitus without complications: Secondary | ICD-10-CM | POA: Diagnosis not present

## 2020-06-06 DIAGNOSIS — R52 Pain, unspecified: Secondary | ICD-10-CM | POA: Diagnosis not present

## 2020-06-09 ENCOUNTER — Other Ambulatory Visit: Payer: Self-pay | Admitting: Neurology

## 2020-06-09 DIAGNOSIS — F329 Major depressive disorder, single episode, unspecified: Secondary | ICD-10-CM | POA: Diagnosis not present

## 2020-06-09 DIAGNOSIS — J80 Acute respiratory distress syndrome: Secondary | ICD-10-CM | POA: Diagnosis not present

## 2020-06-09 DIAGNOSIS — Z5321 Procedure and treatment not carried out due to patient leaving prior to being seen by health care provider: Secondary | ICD-10-CM | POA: Insufficient documentation

## 2020-06-09 DIAGNOSIS — I4891 Unspecified atrial fibrillation: Secondary | ICD-10-CM | POA: Diagnosis not present

## 2020-06-09 DIAGNOSIS — G8111 Spastic hemiplegia affecting right dominant side: Secondary | ICD-10-CM | POA: Diagnosis not present

## 2020-06-09 DIAGNOSIS — R0902 Hypoxemia: Secondary | ICD-10-CM | POA: Diagnosis not present

## 2020-06-09 DIAGNOSIS — S72401A Unspecified fracture of lower end of right femur, initial encounter for closed fracture: Secondary | ICD-10-CM | POA: Diagnosis not present

## 2020-06-09 DIAGNOSIS — M6281 Muscle weakness (generalized): Secondary | ICD-10-CM | POA: Diagnosis not present

## 2020-06-09 DIAGNOSIS — R059 Cough, unspecified: Secondary | ICD-10-CM | POA: Insufficient documentation

## 2020-06-09 DIAGNOSIS — E119 Type 2 diabetes mellitus without complications: Secondary | ICD-10-CM | POA: Diagnosis not present

## 2020-06-09 DIAGNOSIS — R0602 Shortness of breath: Secondary | ICD-10-CM | POA: Diagnosis not present

## 2020-06-09 DIAGNOSIS — R52 Pain, unspecified: Secondary | ICD-10-CM | POA: Diagnosis not present

## 2020-06-09 DIAGNOSIS — R2232 Localized swelling, mass and lump, left upper limb: Secondary | ICD-10-CM | POA: Diagnosis not present

## 2020-06-09 DIAGNOSIS — I4510 Unspecified right bundle-branch block: Secondary | ICD-10-CM | POA: Diagnosis not present

## 2020-06-09 DIAGNOSIS — I509 Heart failure, unspecified: Secondary | ICD-10-CM | POA: Diagnosis not present

## 2020-06-09 DIAGNOSIS — E782 Mixed hyperlipidemia: Secondary | ICD-10-CM | POA: Diagnosis not present

## 2020-06-09 DIAGNOSIS — I10 Essential (primary) hypertension: Secondary | ICD-10-CM | POA: Diagnosis not present

## 2020-06-10 DIAGNOSIS — L89890 Pressure ulcer of other site, unstageable: Secondary | ICD-10-CM | POA: Diagnosis not present

## 2020-06-10 DIAGNOSIS — R601 Generalized edema: Secondary | ICD-10-CM | POA: Diagnosis not present

## 2020-06-11 DIAGNOSIS — F329 Major depressive disorder, single episode, unspecified: Secondary | ICD-10-CM | POA: Diagnosis not present

## 2020-06-11 DIAGNOSIS — I509 Heart failure, unspecified: Secondary | ICD-10-CM | POA: Diagnosis not present

## 2020-06-11 DIAGNOSIS — S72401A Unspecified fracture of lower end of right femur, initial encounter for closed fracture: Secondary | ICD-10-CM | POA: Diagnosis not present

## 2020-06-11 DIAGNOSIS — R52 Pain, unspecified: Secondary | ICD-10-CM | POA: Diagnosis not present

## 2020-06-11 DIAGNOSIS — M6281 Muscle weakness (generalized): Secondary | ICD-10-CM | POA: Diagnosis not present

## 2020-06-11 DIAGNOSIS — G8111 Spastic hemiplegia affecting right dominant side: Secondary | ICD-10-CM | POA: Diagnosis not present

## 2020-06-11 DIAGNOSIS — E119 Type 2 diabetes mellitus without complications: Secondary | ICD-10-CM | POA: Diagnosis not present

## 2020-06-11 DIAGNOSIS — S72454D Nondisplaced supracondylar fracture without intracondylar extension of lower end of right femur, subsequent encounter for closed fracture with routine healing: Secondary | ICD-10-CM | POA: Diagnosis not present

## 2020-06-11 DIAGNOSIS — R532 Functional quadriplegia: Secondary | ICD-10-CM | POA: Diagnosis not present

## 2020-06-11 DIAGNOSIS — I10 Essential (primary) hypertension: Secondary | ICD-10-CM | POA: Diagnosis not present

## 2020-06-11 DIAGNOSIS — E782 Mixed hyperlipidemia: Secondary | ICD-10-CM | POA: Diagnosis not present

## 2020-06-11 DIAGNOSIS — I82622 Acute embolism and thrombosis of deep veins of left upper extremity: Secondary | ICD-10-CM | POA: Diagnosis not present

## 2020-06-11 DIAGNOSIS — S7291XA Unspecified fracture of right femur, initial encounter for closed fracture: Secondary | ICD-10-CM | POA: Diagnosis not present

## 2020-06-13 DIAGNOSIS — E782 Mixed hyperlipidemia: Secondary | ICD-10-CM | POA: Diagnosis not present

## 2020-06-13 DIAGNOSIS — E119 Type 2 diabetes mellitus without complications: Secondary | ICD-10-CM | POA: Diagnosis not present

## 2020-06-13 DIAGNOSIS — M6281 Muscle weakness (generalized): Secondary | ICD-10-CM | POA: Diagnosis not present

## 2020-06-13 DIAGNOSIS — S72401A Unspecified fracture of lower end of right femur, initial encounter for closed fracture: Secondary | ICD-10-CM | POA: Diagnosis not present

## 2020-06-13 DIAGNOSIS — I10 Essential (primary) hypertension: Secondary | ICD-10-CM | POA: Diagnosis not present

## 2020-06-13 DIAGNOSIS — F329 Major depressive disorder, single episode, unspecified: Secondary | ICD-10-CM | POA: Diagnosis not present

## 2020-06-13 DIAGNOSIS — G8111 Spastic hemiplegia affecting right dominant side: Secondary | ICD-10-CM | POA: Diagnosis not present

## 2020-06-13 DIAGNOSIS — R52 Pain, unspecified: Secondary | ICD-10-CM | POA: Diagnosis not present

## 2020-06-13 DIAGNOSIS — I509 Heart failure, unspecified: Secondary | ICD-10-CM | POA: Diagnosis not present

## 2020-06-16 DIAGNOSIS — M6281 Muscle weakness (generalized): Secondary | ICD-10-CM | POA: Diagnosis not present

## 2020-06-16 DIAGNOSIS — E782 Mixed hyperlipidemia: Secondary | ICD-10-CM | POA: Diagnosis not present

## 2020-06-16 DIAGNOSIS — I509 Heart failure, unspecified: Secondary | ICD-10-CM | POA: Diagnosis not present

## 2020-06-16 DIAGNOSIS — F329 Major depressive disorder, single episode, unspecified: Secondary | ICD-10-CM | POA: Diagnosis not present

## 2020-06-16 DIAGNOSIS — I10 Essential (primary) hypertension: Secondary | ICD-10-CM | POA: Diagnosis not present

## 2020-06-16 DIAGNOSIS — R52 Pain, unspecified: Secondary | ICD-10-CM | POA: Diagnosis not present

## 2020-06-16 DIAGNOSIS — G8111 Spastic hemiplegia affecting right dominant side: Secondary | ICD-10-CM | POA: Diagnosis not present

## 2020-06-16 DIAGNOSIS — S72401A Unspecified fracture of lower end of right femur, initial encounter for closed fracture: Secondary | ICD-10-CM | POA: Diagnosis not present

## 2020-06-16 DIAGNOSIS — E119 Type 2 diabetes mellitus without complications: Secondary | ICD-10-CM | POA: Diagnosis not present

## 2020-06-17 ENCOUNTER — Other Ambulatory Visit: Payer: Self-pay | Admitting: *Deleted

## 2020-06-17 DIAGNOSIS — L89890 Pressure ulcer of other site, unstageable: Secondary | ICD-10-CM | POA: Diagnosis not present

## 2020-06-17 NOTE — Patient Outreach (Signed)
[facility] Post- Acute Care Coordinator follow up. Member screened for potential [facility] Care Management needs as a benefit of [place] Medicare.  Per [place] Patient [name] member resides in [facility].  Communication sent to [facility] SWs to request update on anticipated transition plans and potential [facility] Care Management needs.   Marthenia Rolling, MSN, RN,BSN [place] Acute Care Coordinator [phone] [facility]) [phone]  (Toll free office)

## 2020-06-18 ENCOUNTER — Other Ambulatory Visit: Payer: Self-pay | Admitting: *Deleted

## 2020-06-18 DIAGNOSIS — M6281 Muscle weakness (generalized): Secondary | ICD-10-CM | POA: Diagnosis not present

## 2020-06-18 DIAGNOSIS — I509 Heart failure, unspecified: Secondary | ICD-10-CM | POA: Diagnosis not present

## 2020-06-18 DIAGNOSIS — F329 Major depressive disorder, single episode, unspecified: Secondary | ICD-10-CM | POA: Diagnosis not present

## 2020-06-18 DIAGNOSIS — G8111 Spastic hemiplegia affecting right dominant side: Secondary | ICD-10-CM | POA: Diagnosis not present

## 2020-06-18 DIAGNOSIS — E782 Mixed hyperlipidemia: Secondary | ICD-10-CM | POA: Diagnosis not present

## 2020-06-18 DIAGNOSIS — E119 Type 2 diabetes mellitus without complications: Secondary | ICD-10-CM | POA: Diagnosis not present

## 2020-06-18 DIAGNOSIS — R52 Pain, unspecified: Secondary | ICD-10-CM | POA: Diagnosis not present

## 2020-06-18 DIAGNOSIS — I10 Essential (primary) hypertension: Secondary | ICD-10-CM | POA: Diagnosis not present

## 2020-06-18 DIAGNOSIS — S72401A Unspecified fracture of lower end of right femur, initial encounter for closed fracture: Secondary | ICD-10-CM | POA: Diagnosis not present

## 2020-06-18 NOTE — Patient Outreach (Addendum)
[place] Coordinator follow up. Member screened for potential [facility] Care Management needs as a benefit of [place] Medicare.  Update received from [place] indicating transition plan continues to be to return home with supportive son.  Transition date not set yet.   Will plan outreach to discuss potential [facility] Care Management needs.   Marthenia Rolling, MSN, RN,BSN [place] Acute Care Coordinator [phone] [facility]) [phone]  (Toll free office)

## 2020-06-19 ENCOUNTER — Ambulatory Visit (INDEPENDENT_AMBULATORY_CARE_PROVIDER_SITE_OTHER): Payer: Medicare Other | Admitting: Internal Medicine

## 2020-06-19 ENCOUNTER — Encounter: Payer: Self-pay | Admitting: Internal Medicine

## 2020-06-19 ENCOUNTER — Other Ambulatory Visit: Payer: Self-pay

## 2020-06-19 VITALS — BP 120/72 | HR 64 | Temp 98.6°F

## 2020-06-19 DIAGNOSIS — I509 Heart failure, unspecified: Secondary | ICD-10-CM

## 2020-06-19 DIAGNOSIS — E1159 Type 2 diabetes mellitus with other circulatory complications: Secondary | ICD-10-CM | POA: Diagnosis not present

## 2020-06-19 DIAGNOSIS — I10 Essential (primary) hypertension: Secondary | ICD-10-CM | POA: Diagnosis not present

## 2020-06-19 DIAGNOSIS — S46212A Strain of muscle, fascia and tendon of other parts of biceps, left arm, initial encounter: Secondary | ICD-10-CM | POA: Diagnosis not present

## 2020-06-19 DIAGNOSIS — S72144S Nondisplaced intertrochanteric fracture of right femur, sequela: Secondary | ICD-10-CM | POA: Diagnosis not present

## 2020-06-19 LAB — CBC WITH DIFFERENTIAL/PLATELET
Basophils Absolute: 0 10*3/uL (ref 0.0–0.1)
Basophils Relative: 0.3 % (ref 0.0–3.0)
Eosinophils Absolute: 0.2 10*3/uL (ref 0.0–0.7)
Eosinophils Relative: 1.8 % (ref 0.0–5.0)
HCT: 44.2 % (ref 36.0–46.0)
Hemoglobin: 14.1 g/dL (ref 12.0–15.0)
Lymphocytes Relative: 40.4 % (ref 12.0–46.0)
Lymphs Abs: 3.7 10*3/uL (ref 0.7–4.0)
MCHC: 32 g/dL (ref 30.0–36.0)
MCV: 84.7 fl (ref 78.0–100.0)
Monocytes Absolute: 0.7 10*3/uL (ref 0.1–1.0)
Monocytes Relative: 7.8 % (ref 3.0–12.0)
Neutro Abs: 4.5 10*3/uL (ref 1.4–7.7)
Neutrophils Relative %: 49.7 % (ref 43.0–77.0)
Platelets: 275 10*3/uL (ref 150.0–400.0)
RBC: 5.21 Mil/uL — ABNORMAL HIGH (ref 3.87–5.11)
RDW: 14.8 % (ref 11.5–15.5)
WBC: 9.1 10*3/uL (ref 4.0–10.5)

## 2020-06-19 LAB — BASIC METABOLIC PANEL
BUN: 8 mg/dL (ref 6–23)
CO2: 22 mEq/L (ref 19–32)
Calcium: 9.4 mg/dL (ref 8.4–10.5)
Chloride: 107 mEq/L (ref 96–112)
Creatinine, Ser: 0.64 mg/dL (ref 0.40–1.20)
GFR: 86.19 mL/min (ref >60.00)
Glucose, Bld: 86 mg/dL (ref 70–99)
Potassium: 4 mEq/L (ref 3.5–5.1)
Sodium: 138 mEq/L (ref 135–145)

## 2020-06-19 LAB — HEPATIC FUNCTION PANEL
ALT: 16 U/L (ref 0–35)
AST: 21 U/L (ref 0–37)
Albumin: 3.9 g/dL (ref 3.5–5.2)
Alkaline Phosphatase: 70 U/L (ref 39–117)
Bilirubin, Direct: 0.1 mg/dL (ref 0.0–0.3)
Total Bilirubin: 0.4 mg/dL (ref 0.2–1.2)
Total Protein: 6.8 g/dL (ref 6.0–8.3)

## 2020-06-19 LAB — LIPID PANEL
Cholesterol: 96 mg/dL (ref 0–200)
HDL: 41.5 mg/dL (ref >39.00)
LDL Cholesterol: 29 mg/dL (ref 0–99)
NonHDL: 54.78
Total CHOL/HDL Ratio: 2
Triglycerides: 130 mg/dL (ref 0.0–149.0)
VLDL: 26 mg/dL (ref 0.0–40.0)

## 2020-06-19 LAB — HEMOGLOBIN A1C: Hgb A1c MFr Bld: 7.1 % — ABNORMAL HIGH (ref 4.6–6.5)

## 2020-06-19 LAB — TSH: TSH: 1.6 u[IU]/mL (ref 0.35–4.50)

## 2020-06-19 NOTE — Progress Notes (Signed)
Subjective:    Patient ID: [name], female    DOB: [birth year], 74 y.o.   MRN: [id number]  HPI  Here with son who brings her from her current rehab inpatient stay with c/o 2 wks worsening left upper arm firmness and swelling, mild tender, maybe some weaker.  Pt denies chest pain, increased sob or doe, wheezing, orthopnea, PND, increased LE swelling, palpitations, dizziness or syncope.  Pt denies new neurological symptoms such as new headache, or facial or extremity weakness or numbness   Pt denies polydipsia, polyuria,  Has been getting PT at the facility involving increaed LUE use and excercises with pulling across the body and pulling the upper body up . Past Medical History:  Diagnosis Date  . Acute blood loss anemia   . CHF (congestive heart failure) (HCC)    EF 50-53%, grade 1 diastolic dysfunction per echo 04/2015  . 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 CVA (cerebral infarction) 04/2015   Started Plavix 04/2015  . Depression   . Diabetes ([place]) 2016   Type II. On insulin  . Enchondroma of bone 2011   left femur.   . Fracture, intertrochanteric, right femur ([place])   . History of lower GI bleeding   . Hyperlipidemia   . Hypertension   . Patent foramen ovale 04/2015   Started on warfarin 04/2015  . Protein calorie malnutrition ([place])   . Stercoral ulcer of rectum 05/16/2015   Past Surgical History:  Procedure Laterality Date  . CARDIAC SURGERY     Cath without stent  . COLONOSCOPY N/A 05/15/2015   Procedure: COLONOSCOPY;  Surgeon: Mauri Pole, MD;  Location: [facility] ENDOSCOPY;  Service: Endoscopy;  Laterality: N/A;  . FLEXIBLE SIGMOIDOSCOPY N/A 05/15/2015   Procedure: FLEXIBLE SIGMOIDOSCOPY;  Surgeon: Mauri Pole, MD;  Location: [facility] ENDOSCOPY;  Service: Endoscopy;  Laterality: N/A;  at bedside  . INTRAMEDULLARY (IM) NAIL INTERTROCHANTERIC Right 06/12/2015   Procedure: INTRAMEDULLARY (IM) NAIL RIGHT HIP;   Surgeon: Renette Butters, MD;  Location: [place];  Service: Orthopedics;  Laterality: Right;  . TEE WITHOUT CARDIOVERSION N/A 05/05/2015   Procedure: TRANSESOPHAGEAL ECHOCARDIOGRAM (TEE);  Surgeon: Dixie Dials, MD;  Location: [facility] ENDOSCOPY;  Service: Cardiovascular;  Laterality: N/A;    reports that she quit smoking about 5 years ago. Her smoking use included cigarettes. She has never used smokeless tobacco. She reports that she does not drink alcohol and does not use drugs. family history includes Heart attack in her father; Heart failure in her mother; Hyperlipidemia in her mother; Hypertension in her mother. Allergies  Allergen Reactions  . Pork-Derived Products Other (See Comments)    To keep blood pressure down   Current Outpatient Medications on File Prior to Visit  Medication Sig Dispense Refill  . atorvastatin (LIPITOR) 40 MG tablet Take 0.5 tablets (20 mg total) by mouth daily.    . baclofen (LIORESAL) 10 MG tablet Take 1 tablet (10 mg total) by mouth 3 (three) times daily. 270 tablet 4  . ELIQUIS 5 MG TABS tablet Take 5 mg by mouth 2 (two) times daily.     Marland Kitchen gabapentin (NEURONTIN) 300 MG capsule Take 1 capsule (300 mg total) by mouth 3 (three) times daily. 270 capsule 4  . insulin aspart (NOVOLOG) 100 UNIT/ML injection Inject 0-15 Units into the skin 3 (three) times daily with meals. 10 mL 0  . insulin detemir (LEVEMIR) 100 UNIT/ML injection Inject 0.05 mLs (5 Units total)  into the skin daily.    Marland Kitchen levETIRAcetam (KEPPRA) 500 MG tablet Take 1 tablet (500 mg total) by mouth 2 (two) times daily. 180 tablet 0  . losartan (COZAAR) 25 MG tablet Take 12.5 mg by mouth daily.     . metoprolol succinate (TOPROL-XL) 25 MG 24 hr tablet Take 25 mg by mouth daily.     . traMADol (ULTRAM) 50 MG tablet Take 1 tablet (50 mg total) by mouth every 6 (six) hours as needed for moderate pain. 14 tablet 0   No current facility-administered medications on file prior to visit.   Review of Systems All  otherwise neg per pt     Objective:   Physical Exam BP 120/72   Pulse 64   Temp 98.6 F (37 C) (Oral)   SpO2 95%  VS noted,  Constitutional: Pt appears in NAD HENT: Head: NCAT.  Right Ear: External ear normal.  Left Ear: External ear normal.  Eyes: . Pupils are equal, round, and reactive to light. Conjunctivae and EOM are normal Nose: without d/c or deformity Neck: Neck supple. Gross normal ROM LUE with ruptured lateal bicep tendon at the elbow insertion site with bunching up of the muscle accounting for the LUE firmness/swellingh Cardiovascular: Normal rate and regular rhythm.   Pulmonary/Chest: Effort normal and breath sounds without rales or wheezing.  Abd:  Soft, NT, ND, + BS, no organomegaly Neurological: Pt is alert. At baseline orientation, motor grossly intact Skin: Skin is warm. No rashes, other new lesions, no LE edema Psychiatric: Pt behavior is normal without agitation  All otherwise neg per pt Lab Results  Component Value Date   WBC 9.1 06/19/2020   HGB 14.1 06/19/2020   HCT 44.2 06/19/2020   PLT 275.0 06/19/2020   GLUCOSE 86 06/19/2020   CHOL 96 06/19/2020   TRIG 130.0 06/19/2020   HDL 41.50 06/19/2020   LDLDIRECT 59.0 03/20/2020   LDLCALC 29 06/19/2020   ALT 16 06/19/2020   AST 21 06/19/2020   NA 138 06/19/2020   K 4.0 06/19/2020   CL 107 06/19/2020   CREATININE 0.64 06/19/2020   BUN 8 06/19/2020   CO2 22 06/19/2020   TSH 1.60 06/19/2020   INR 1.2 04/06/2020   HGBA1C 7.1 (H) 06/19/2020   MICROALBUR 2.3 (H) 11/04/2017      Assessment & Plan:

## 2020-06-19 NOTE — Patient Instructions (Signed)
No pushing or pulling with the left hand and arm more than 5 lbs permanently  Ok to use left arm for eating, drinking, grooming.  Please continue all other medications as before, and refills have been done if requested.  Please have the pharmacy call with any other refills you may need.  Please continue your efforts at being more active, low cholesterol diet, and weight control.  Please keep your appointments with your specialists as you may have planned  Please go to the LAB at the blood drawing area for the tests to be done  You will be contacted by phone if any changes need to be made immediately.  Otherwise, you will receive a letter about your results with an explanation, but please check with MyChart first.  Please remember to sign up for MyChart if you have not done so, as this will be important to you in the future with finding out test results, communicating by private email, and scheduling acute appointments online when needed.  Please make an Appointment to return in 6 months, or sooner if needed

## 2020-06-19 NOTE — Assessment & Plan Note (Signed)
Resolved, doing well, to cont rehab since oct 7 and work on wt bearing with standing excercises et al, and getting back home - plan now if for jan 1 going home and then home PT,

## 2020-06-20 DIAGNOSIS — M6281 Muscle weakness (generalized): Secondary | ICD-10-CM | POA: Diagnosis not present

## 2020-06-20 DIAGNOSIS — E782 Mixed hyperlipidemia: Secondary | ICD-10-CM | POA: Diagnosis not present

## 2020-06-20 DIAGNOSIS — G8111 Spastic hemiplegia affecting right dominant side: Secondary | ICD-10-CM | POA: Diagnosis not present

## 2020-06-20 DIAGNOSIS — I509 Heart failure, unspecified: Secondary | ICD-10-CM | POA: Diagnosis not present

## 2020-06-20 DIAGNOSIS — E119 Type 2 diabetes mellitus without complications: Secondary | ICD-10-CM | POA: Diagnosis not present

## 2020-06-20 DIAGNOSIS — I10 Essential (primary) hypertension: Secondary | ICD-10-CM | POA: Diagnosis not present

## 2020-06-20 DIAGNOSIS — R52 Pain, unspecified: Secondary | ICD-10-CM | POA: Diagnosis not present

## 2020-06-20 DIAGNOSIS — S72401A Unspecified fracture of lower end of right femur, initial encounter for closed fracture: Secondary | ICD-10-CM | POA: Diagnosis not present

## 2020-06-20 DIAGNOSIS — F329 Major depressive disorder, single episode, unspecified: Secondary | ICD-10-CM | POA: Diagnosis not present

## 2020-06-22 ENCOUNTER — Encounter: Payer: Self-pay | Admitting: Internal Medicine

## 2020-06-22 NOTE — Assessment & Plan Note (Signed)
stable overall by history and exam, recent data reviewed with pt, and pt to continue medical treatment as before,  to f/u any worsening symptoms or concerns  

## 2020-06-22 NOTE — Assessment & Plan Note (Addendum)
Partial rupture only of the lateral tendon at the elbow insertion site; but order given for no pushing or pulling more then 5 lbs to avoid complete rupture, as needs the arm to be able to feed herself s/p cva  I spent 41 minutes in preparing to see the patient by review of recent labs, imaging and procedures, obtaining and reviewing separately obtained history, communicating with the patient and family or caregiver, ordering medications, tests or procedures, and documenting clinical information in the EHR including the differential Dx, treatment, and any further evaluation and other management of rupture left bicep tendon, dm, htn, chf, right femur fracture

## 2020-06-23 DIAGNOSIS — F329 Major depressive disorder, single episode, unspecified: Secondary | ICD-10-CM | POA: Diagnosis not present

## 2020-06-23 DIAGNOSIS — G8111 Spastic hemiplegia affecting right dominant side: Secondary | ICD-10-CM | POA: Diagnosis not present

## 2020-06-23 DIAGNOSIS — M6281 Muscle weakness (generalized): Secondary | ICD-10-CM | POA: Diagnosis not present

## 2020-06-23 DIAGNOSIS — E119 Type 2 diabetes mellitus without complications: Secondary | ICD-10-CM | POA: Diagnosis not present

## 2020-06-23 DIAGNOSIS — R52 Pain, unspecified: Secondary | ICD-10-CM | POA: Diagnosis not present

## 2020-06-23 DIAGNOSIS — I509 Heart failure, unspecified: Secondary | ICD-10-CM | POA: Diagnosis not present

## 2020-06-23 DIAGNOSIS — I10 Essential (primary) hypertension: Secondary | ICD-10-CM | POA: Diagnosis not present

## 2020-06-23 DIAGNOSIS — R2232 Localized swelling, mass and lump, left upper limb: Secondary | ICD-10-CM | POA: Diagnosis not present

## 2020-06-23 DIAGNOSIS — E782 Mixed hyperlipidemia: Secondary | ICD-10-CM | POA: Diagnosis not present

## 2020-06-23 DIAGNOSIS — R2231 Localized swelling, mass and lump, right upper limb: Secondary | ICD-10-CM | POA: Diagnosis not present

## 2020-06-23 DIAGNOSIS — S72401A Unspecified fracture of lower end of right femur, initial encounter for closed fracture: Secondary | ICD-10-CM | POA: Diagnosis not present

## 2020-06-23 DIAGNOSIS — I82622 Acute embolism and thrombosis of deep veins of left upper extremity: Secondary | ICD-10-CM | POA: Diagnosis not present

## 2020-06-24 DIAGNOSIS — R532 Functional quadriplegia: Secondary | ICD-10-CM | POA: Diagnosis not present

## 2020-06-24 DIAGNOSIS — S7291XD Unspecified fracture of right femur, subsequent encounter for closed fracture with routine healing: Secondary | ICD-10-CM | POA: Diagnosis not present

## 2020-06-24 DIAGNOSIS — L89890 Pressure ulcer of other site, unstageable: Secondary | ICD-10-CM | POA: Diagnosis not present

## 2020-06-25 DIAGNOSIS — S72401A Unspecified fracture of lower end of right femur, initial encounter for closed fracture: Secondary | ICD-10-CM | POA: Diagnosis not present

## 2020-06-25 DIAGNOSIS — M6281 Muscle weakness (generalized): Secondary | ICD-10-CM | POA: Diagnosis not present

## 2020-06-25 DIAGNOSIS — G8111 Spastic hemiplegia affecting right dominant side: Secondary | ICD-10-CM | POA: Diagnosis not present

## 2020-06-25 DIAGNOSIS — F329 Major depressive disorder, single episode, unspecified: Secondary | ICD-10-CM | POA: Diagnosis not present

## 2020-06-25 DIAGNOSIS — E119 Type 2 diabetes mellitus without complications: Secondary | ICD-10-CM | POA: Diagnosis not present

## 2020-06-25 DIAGNOSIS — R52 Pain, unspecified: Secondary | ICD-10-CM | POA: Diagnosis not present

## 2020-06-25 DIAGNOSIS — E782 Mixed hyperlipidemia: Secondary | ICD-10-CM | POA: Diagnosis not present

## 2020-06-25 DIAGNOSIS — I509 Heart failure, unspecified: Secondary | ICD-10-CM | POA: Diagnosis not present

## 2020-06-25 DIAGNOSIS — I10 Essential (primary) hypertension: Secondary | ICD-10-CM | POA: Diagnosis not present

## 2020-06-26 DIAGNOSIS — M24573 Contracture, unspecified ankle: Secondary | ICD-10-CM | POA: Diagnosis not present

## 2020-06-26 DIAGNOSIS — I69359 Hemiplegia and hemiparesis following cerebral infarction affecting unspecified side: Secondary | ICD-10-CM | POA: Diagnosis not present

## 2020-06-26 DIAGNOSIS — I110 Hypertensive heart disease with heart failure: Secondary | ICD-10-CM | POA: Diagnosis not present

## 2020-06-26 DIAGNOSIS — I5032 Chronic diastolic (congestive) heart failure: Secondary | ICD-10-CM | POA: Diagnosis not present

## 2020-06-30 DIAGNOSIS — F329 Major depressive disorder, single episode, unspecified: Secondary | ICD-10-CM | POA: Diagnosis not present

## 2020-06-30 DIAGNOSIS — E782 Mixed hyperlipidemia: Secondary | ICD-10-CM | POA: Diagnosis not present

## 2020-06-30 DIAGNOSIS — I10 Essential (primary) hypertension: Secondary | ICD-10-CM | POA: Diagnosis not present

## 2020-06-30 DIAGNOSIS — S72401A Unspecified fracture of lower end of right femur, initial encounter for closed fracture: Secondary | ICD-10-CM | POA: Diagnosis not present

## 2020-06-30 DIAGNOSIS — I509 Heart failure, unspecified: Secondary | ICD-10-CM | POA: Diagnosis not present

## 2020-06-30 DIAGNOSIS — G8111 Spastic hemiplegia affecting right dominant side: Secondary | ICD-10-CM | POA: Diagnosis not present

## 2020-06-30 DIAGNOSIS — E119 Type 2 diabetes mellitus without complications: Secondary | ICD-10-CM | POA: Diagnosis not present

## 2020-06-30 DIAGNOSIS — R52 Pain, unspecified: Secondary | ICD-10-CM | POA: Diagnosis not present

## 2020-06-30 DIAGNOSIS — M6281 Muscle weakness (generalized): Secondary | ICD-10-CM | POA: Diagnosis not present

## 2020-07-01 DIAGNOSIS — I10 Essential (primary) hypertension: Secondary | ICD-10-CM | POA: Diagnosis not present

## 2020-07-01 DIAGNOSIS — E118 Type 2 diabetes mellitus with unspecified complications: Secondary | ICD-10-CM | POA: Diagnosis not present

## 2020-07-01 DIAGNOSIS — G40909 Epilepsy, unspecified, not intractable, without status epilepticus: Secondary | ICD-10-CM | POA: Diagnosis not present

## 2020-07-01 DIAGNOSIS — Z8673 Personal history of transient ischemic attack (TIA), and cerebral infarction without residual deficits: Secondary | ICD-10-CM | POA: Diagnosis not present

## 2020-07-02 DIAGNOSIS — S72401A Unspecified fracture of lower end of right femur, initial encounter for closed fracture: Secondary | ICD-10-CM | POA: Diagnosis not present

## 2020-07-02 DIAGNOSIS — F329 Major depressive disorder, single episode, unspecified: Secondary | ICD-10-CM | POA: Diagnosis not present

## 2020-07-02 DIAGNOSIS — E119 Type 2 diabetes mellitus without complications: Secondary | ICD-10-CM | POA: Diagnosis not present

## 2020-07-02 DIAGNOSIS — I10 Essential (primary) hypertension: Secondary | ICD-10-CM | POA: Diagnosis not present

## 2020-07-02 DIAGNOSIS — I509 Heart failure, unspecified: Secondary | ICD-10-CM | POA: Diagnosis not present

## 2020-07-02 DIAGNOSIS — G8111 Spastic hemiplegia affecting right dominant side: Secondary | ICD-10-CM | POA: Diagnosis not present

## 2020-07-02 DIAGNOSIS — R52 Pain, unspecified: Secondary | ICD-10-CM | POA: Diagnosis not present

## 2020-07-02 DIAGNOSIS — M6281 Muscle weakness (generalized): Secondary | ICD-10-CM | POA: Diagnosis not present

## 2020-07-02 DIAGNOSIS — E782 Mixed hyperlipidemia: Secondary | ICD-10-CM | POA: Diagnosis not present

## 2020-07-07 ENCOUNTER — Other Ambulatory Visit: Payer: Self-pay | Admitting: *Deleted

## 2020-07-07 NOTE — Patient Outreach (Signed)
[facility] Post- Acute Care Coordinator follow up. Member screened for potential [facility] Care Management needs.  Communication sent to [facility] SN[facility] social workers to request update on transition plans.   Will plan outreach to discuss [facility] Care Management services as appropriate.   Raiford Noble, MSN, RN,BSN [facility] Post Acute Care Coordinator [phone] [facility]) [phone]  (Toll free office)

## 2020-07-10 ENCOUNTER — Encounter: Payer: Self-pay | Admitting: Internal Medicine

## 2020-07-10 DIAGNOSIS — I509 Heart failure, unspecified: Secondary | ICD-10-CM

## 2020-07-10 DIAGNOSIS — E1159 Type 2 diabetes mellitus with other circulatory complications: Secondary | ICD-10-CM

## 2020-07-10 DIAGNOSIS — Z8673 Personal history of transient ischemic attack (TIA), and cerebral infarction without residual deficits: Secondary | ICD-10-CM

## 2020-07-10 DIAGNOSIS — S81801D Unspecified open wound, right lower leg, subsequent encounter: Secondary | ICD-10-CM

## 2020-07-10 DIAGNOSIS — G8111 Spastic hemiplegia affecting right dominant side: Secondary | ICD-10-CM

## 2020-07-14 NOTE — Telephone Encounter (Signed)
Follow up message  Patient will be sending photos of wound later this evening

## 2020-07-16 ENCOUNTER — Other Ambulatory Visit: Payer: Self-pay | Admitting: *Deleted

## 2020-07-16 NOTE — Patient Outreach (Signed)
[facility] Post- Acute Care Coordinator follow up. Member screened for potential [facility] needs.   Noted in [place] (Patient [name]) that member transitioned home on 07/08/20. Telephone call made to son/DPR [name] [phone] to discuss [facility] services.   [name] reports that his mother has a leg wound that he is concerned about. States PCP has ordered home health RN. States member currently has [facility] for PT/OT services. States [facility] was given referral for home health RN. [name] states [facility] unable to accept referral due to this. [name] advised to contact [facility] to follow up on home health nursing order. Telephone number provided.  Also encouraged [name] to make a PCP appointment sooner since member recently discharged from SNF. [name] reports next PCP appointment is in February. [name] states he will do so after he gets home health arrangements straightened out.   Discussed [facility] follow up for home visits. Explained these services will not interfere with home health. Terry agreeable.  Referral sent to [facility] for home visits. Member has multiple comorbities and increased risk for hospital readmission.    Marthenia Rolling, MSN, RN,BSN [place] Acute Care Coordinator [phone] [facility]) [phone]  (Toll free office)

## 2020-07-18 ENCOUNTER — Telehealth: Payer: Self-pay | Admitting: Internal Medicine

## 2020-07-18 NOTE — Telephone Encounter (Signed)
      [place] Name: [place] Name: Santina Evans Phone #: [phone] Service Requested: nursing and wound care (examples: OT/PT/Skilled Nursing/Social Work/Speech Therapy/Wound Care) Frequency of Visits: 2w3

## 2020-07-18 NOTE — Telephone Encounter (Signed)
This medication has not been a regular medication for this pt  Last rx was for 14 tabs in oct 2021 (none before or since)  It has a small chance of making her heart rhythm problem worse, so I would have to say I dont feel comfortable with this medication refill for her

## 2020-07-18 NOTE — Telephone Encounter (Signed)
Verbal orders given  

## 2020-07-18 NOTE — Telephone Encounter (Signed)
1.Medication Requested: traMADol (ULTRAM) 50 MG tablet    2. Pharmacy (Name, Street, [place]): [place] - [address]  3. On Med List: yes   4. Last Visit with PCP: 12.16.21  5. Next visit date with PCP: n/a    Agent: Please be advised that RX refills may take up to 3 business days. We ask that you follow-up with your pharmacy.

## 2020-07-24 ENCOUNTER — Telehealth: Payer: Self-pay | Admitting: Internal Medicine

## 2020-07-24 NOTE — Telephone Encounter (Signed)
Jm w/ [facility] Lemmings called and was wondering the patients weight bearing status.     Okay to LVM: [phone].

## 2020-07-24 NOTE — Telephone Encounter (Signed)
There is no specific limit to wt bearing status to LEs, though is limited by the right spastic hemiparesis.  Pt does have 5 lb wt limit to use fo the LUE due to partial bicep tendon rupture  Ok for verbals as requested

## 2020-07-24 NOTE — Telephone Encounter (Signed)
° °  [name] from [facility] also reporting patient has missed 2 visits, requesting verbal order to make up thise visit  Phone [phone]

## 2020-07-25 ENCOUNTER — Telehealth: Payer: Self-pay

## 2020-07-25 ENCOUNTER — Encounter: Payer: Self-pay | Admitting: Internal Medicine

## 2020-07-25 MED ORDER — METFORMIN HCL ER 750 MG PO TB24: 750.0000 mg | ORAL_TABLET | Freq: Every day | ORAL | 1 refills | Status: DC

## 2020-07-25 MED ORDER — TRAMADOL HCL 50 MG PO TABS
50.0000 mg | ORAL_TABLET | Freq: Four times a day (QID) | ORAL | 2 refills | Status: DC | PRN
Start: 2020-07-25 — End: 2022-03-31

## 2020-07-25 MED ORDER — TRAMADOL HCL 50 MG PO TABS
50.0000 mg | ORAL_TABLET | Freq: Four times a day (QID) | ORAL | 2 refills | Status: DC | PRN
Start: 2020-07-25 — End: 2020-07-25

## 2020-07-25 NOTE — Telephone Encounter (Signed)
Verbals left on Jim vociemail.

## 2020-08-05 ENCOUNTER — Telehealth: Payer: Self-pay | Admitting: Internal Medicine

## 2020-08-05 NOTE — Telephone Encounter (Signed)
Called pt to schedule awv with nha but vmb was full. Please schedule this appt if pt alls the office.

## 2020-08-06 ENCOUNTER — Encounter (HOSPITAL_BASED_OUTPATIENT_CLINIC_OR_DEPARTMENT_OTHER): Payer: Medicare Other | Admitting: Physician Assistant

## 2020-08-22 ENCOUNTER — Telehealth: Payer: Self-pay | Admitting: Internal Medicine

## 2020-08-22 NOTE — Telephone Encounter (Signed)
   Sarah from [facility] requesting verbal orders to extend nursing 1w2  Also order to change dressing  With Lamont Dowdy cover with foam dressing EOD  Phone [phone]

## 2020-08-25 NOTE — Telephone Encounter (Signed)
Verbal given to Judson Roch

## 2020-08-25 NOTE — Telephone Encounter (Signed)
Ok for verbals 

## 2020-09-25 ENCOUNTER — Telehealth: Payer: Self-pay | Admitting: Internal Medicine

## 2020-09-25 NOTE — Telephone Encounter (Signed)
Mary a Therapist, sports from [employer], states she just had a visit with the patient and the patient is having visible muscle spasms in her lower right extremity. She thinks its from the PT but she was wondering if there was a muscle relaxer that could be sent in for the patient because of the severity of the spasms. States the patient takes  baclofen (LIORESAL) 10 MG tablet three times daily [place] ([place] - [address] Phone:  [phone]  Fax:  [phone]

## 2020-09-26 NOTE — Telephone Encounter (Addendum)
Mary notified that Dr. Jenny Reichmann has declined the muscle relaxer

## 2020-09-26 NOTE — Telephone Encounter (Signed)
I would not feel comfortable with a muscle relaxer in addition to the baclofen, as this is not usually done

## 2020-10-03 ENCOUNTER — Telehealth: Payer: Self-pay | Admitting: Internal Medicine

## 2020-10-03 NOTE — Telephone Encounter (Signed)
Mary from [facility] calling, states the patient had a missed visit today due to patients request, patient had a death in the family.

## 2020-10-23 ENCOUNTER — Telehealth: Payer: Self-pay | Admitting: Internal Medicine

## 2020-10-23 ENCOUNTER — Other Ambulatory Visit: Payer: Self-pay | Admitting: Internal Medicine

## 2020-10-23 MED ORDER — LANCETS MISC: 11 refills | Status: DC

## 2020-10-23 NOTE — Telephone Encounter (Signed)
Lancets are done erx  There are many many many many types of syringes. So we need to be specific about the type of syringe and how many per day

## 2020-10-23 NOTE — Telephone Encounter (Signed)
[name] with [facility] called and was wondering if an order for diabetic supplies could be sent to [place] ([place]), [place] - [address]. She said that the patient is needing lancets and insulin syringe. Please advise

## 2020-10-23 NOTE — Telephone Encounter (Signed)
Please refill as per office routine med refill policy (all routine meds refilled for 3 mo or monthly per pt preference up to one year from last visit, then month to month grace period for 3 mo, then further med refills will have to be denied)  

## 2020-10-24 NOTE — Telephone Encounter (Signed)
Reached out to [place]- notified her we need the names of the syringes she is requesting, she states she will reach out to the family to see what they have been using.

## 2020-10-24 NOTE — Telephone Encounter (Signed)
Ok to fill 

## 2020-11-10 MED ORDER — "INSULIN SYRINGE-NEEDLE U-100 31G X 5/16"" 1 ML MISC": 3 refills | Status: DC

## 2020-11-10 MED ORDER — LANCETS MISC
11 refills | Status: DC
Start: 2020-11-10 — End: 2020-12-05

## 2020-11-10 NOTE — Addendum Note (Signed)
Addended by: Biagio Borg on: 11/10/2020 08:56 PM   Modules accepted: Orders

## 2020-11-10 NOTE — Telephone Encounter (Signed)
Syringe/needle and lancets rx sent  I did not specify brand as it does not matter  I will not change the rx

## 2020-11-10 NOTE — Telephone Encounter (Signed)
Spoke with Wells Guiles from [facility] and she states that patient uses 31G x 81mm 50 unit syringes and Prime lancets (which is the [employer] brand). This needs to be sent to [facility]

## 2020-11-18 ENCOUNTER — Other Ambulatory Visit: Payer: Self-pay | Admitting: Neurology

## 2020-12-05 ENCOUNTER — Telehealth: Payer: Self-pay | Admitting: Internal Medicine

## 2020-12-05 MED ORDER — LANCETS MISC: 11 refills | Status: DC

## 2020-12-05 NOTE — Telephone Encounter (Signed)
[name] with [facility] is requesting that Lancets MISC be resent to [place] ([place]), [place] - [address]. She said that the pharmacy never received them. Please advise

## 2020-12-05 NOTE — Telephone Encounter (Signed)
Prescription re-sent to pharmacy.

## 2020-12-30 ENCOUNTER — Encounter: Payer: Self-pay | Admitting: Internal Medicine

## 2021-01-07 ENCOUNTER — Other Ambulatory Visit: Payer: Self-pay

## 2021-01-07 ENCOUNTER — Encounter (HOSPITAL_BASED_OUTPATIENT_CLINIC_OR_DEPARTMENT_OTHER): Payer: Medicare Other | Attending: Physician Assistant | Admitting: Physician Assistant

## 2021-01-07 DIAGNOSIS — E119 Type 2 diabetes mellitus without complications: Secondary | ICD-10-CM | POA: Diagnosis not present

## 2021-01-07 DIAGNOSIS — I5042 Chronic combined systolic (congestive) and diastolic (congestive) heart failure: Secondary | ICD-10-CM | POA: Insufficient documentation

## 2021-01-07 DIAGNOSIS — I2510 Atherosclerotic heart disease of native coronary artery without angina pectoris: Secondary | ICD-10-CM | POA: Diagnosis not present

## 2021-01-07 DIAGNOSIS — L89893 Pressure ulcer of other site, stage 3: Secondary | ICD-10-CM | POA: Diagnosis not present

## 2021-01-07 DIAGNOSIS — Z87891 Personal history of nicotine dependence: Secondary | ICD-10-CM | POA: Diagnosis not present

## 2021-01-07 DIAGNOSIS — I110 Hypertensive heart disease with heart failure: Secondary | ICD-10-CM | POA: Insufficient documentation

## 2021-01-07 DIAGNOSIS — Z7901 Long term (current) use of anticoagulants: Secondary | ICD-10-CM | POA: Insufficient documentation

## 2021-01-07 DIAGNOSIS — I69351 Hemiplegia and hemiparesis following cerebral infarction affecting right dominant side: Secondary | ICD-10-CM | POA: Diagnosis not present

## 2021-01-07 NOTE — Progress Notes (Signed)
ARMIE MOREN ([id number]) , Visit Report for 01/07/2021 Allergy List Details Patient Name: Date of Service: ANISA, LEANOS 01/07/2021 9:00 A M Medical Record Number: [id number] Patient Account Number: [id number] Date of Birth/Sex: Treating RN: October 05, 1945 (75 y.o. Female) Lorrin Jackson Primary Care Khailee Mick: Cathlean Cower Other Clinician: Referring Rylie Limburg: Treating Brynden Thune/Extender: Roque Cash in Treatment: 0 Allergies Active Allergies pork-derived products Type: Food Allergy Notes Electronic Signature(s) Signed: 01/07/2021 5:24:26 PM By: Lorrin Jackson Entered By: Lorrin Jackson on 01/07/2021 09:32:09 -------------------------------------------------------------------------------- Arrival Information Details Patient Name: Date of Service: [name] 01/07/2021 9:00 A M Medical Record Number: [id number] Patient Account Number: [id number] Date of Birth/Sex: Treating RN: [birth year] (75 y.o. Female) Lorrin Jackson Primary Care Khamil Lamica: Cathlean Cower Other Clinician: Referring Gaye Scorza: Treating Gwenda Heiner/Extender: Roque Cash in Treatment: 0 Visit Information Patient Arrived: Wheel Chair Arrival Time: 09:22 Accompanied By: son Transfer Assistance: Manual Patient Identification Verified: Yes Secondary Verification Process Completed: Yes Patient Requires Transmission-Based Precautions: No Patient Has Alerts: Yes Patient Alerts: Patient on Blood Thinner Electronic Signature(s) Signed: 01/07/2021 5:24:26 PM By: Lorrin Jackson Entered By: Lorrin Jackson on 01/07/2021 09:29:49 -------------------------------------------------------------------------------- Clinic Level of Care Assessment Details Patient Name: Date of Service: VIRGENE, TIRONE 01/07/2021 9:00 A M Medical Record Number: [id number] Patient Account Number: [id number] Date of Birth/Sex: Treating RN: 17-Dec-1945 (75 y.o. Female) Baruch Gouty Primary Care Stevee Valenta: Cathlean Cower Other Clinician: Referring Linden Mikes: Treating Jontavius Rabalais/Extender: Roque Cash in Treatment: 0 Clinic Level of Care Assessment Items TOOL 2 Quantity Score []  - 0 Use when only an EandM is performed on the INITIAL visit ASSESSMENTS - Nursing Assessment / Reassessment X- 1 20 General Physical Exam (combine w/ comprehensive assessment (listed just below) when performed on new pt. evals) X- 1 25 Comprehensive Assessment (HX, ROS, Risk Assessments, Wounds Hx, etc.) ASSESSMENTS - Wound and Skin A ssessment / Reassessment X - Simple Wound Assessment / Reassessment - one wound 1 5 []  - 0 Complex Wound Assessment / Reassessment - multiple wounds []  - 0 Dermatologic / Skin Assessment (not related to wound area) ASSESSMENTS - Ostomy and/or Continence Assessment and Care []  - 0 Incontinence Assessment and Management []  - 0 Ostomy Care Assessment and Management (repouching, etc.) PROCESS - Coordination of Care X - Simple Patient / Family Education for ongoing care 1 15 []  - 0 Complex (extensive) Patient / Family Education for ongoing care X- 1 10 Staff obtains Programmer, systems, Records, T Results / Process Orders est []  - 0 Staff telephones HHA, Nursing Homes / Clarify orders / etc []  - 0 Routine Transfer to another Facility (non-emergent condition) []  - 0 Routine Hospital Admission (non-emergent condition) X- 1 15 New Admissions / Biomedical engineer / Ordering NPWT Apligraf, etc. , []  - 0 Emergency Hospital Admission (emergent condition) X- 1 10 Simple Discharge Coordination []  - 0 Complex (extensive) Discharge Coordination PROCESS - Special Needs []  - 0 Pediatric / Minor Patient Management []  - 0 Isolation Patient Management []  - 0 Hearing / Language / Visual special needs []  - 0 Assessment of Community assistance (transportation, D/C planning, etc.) []  - 0 Additional assistance / Altered mentation []  - 0 Support Surface(s) Assessment (bed,  cushion, seat, etc.) INTERVENTIONS - Wound Cleansing / Measurement X- 1 5 Wound Imaging (photographs - any number of wounds) []  - 0 Wound Tracing (instead of photographs) X- 1 5 Simple Wound Measurement - one wound []  - 0 Complex Wound Measurement - multiple wounds X- 1 5 Simple  Wound Cleansing - one wound []  - 0 Complex Wound Cleansing - multiple wounds INTERVENTIONS - Wound Dressings X - Small Wound Dressing one or multiple wounds 1 10 []  - 0 Medium Wound Dressing one or multiple wounds []  - 0 Large Wound Dressing one or multiple wounds []  - 0 Application of Medications - injection INTERVENTIONS - Miscellaneous []  - 0 External ear exam []  - 0 Specimen Collection (cultures, biopsies, blood, body fluids, etc.) []  - 0 Specimen(s) / Culture(s) sent or taken to Lab for analysis []  - 0 Patient Transfer (multiple staff / Civil Service fast streamer / Similar devices) []  - 0 Simple Staple / Suture removal (25 or less) []  - 0 Complex Staple / Suture removal (26 or more) []  - 0 Hypo / Hyperglycemic Management (close monitor of Blood Glucose) X- 1 15 Ankle / Brachial Index (ABI) - do not check if billed separately Has the patient been seen at the hospital within the last three years: Yes Total Score: 140 Level Of Care: New/Established - Level 4 Electronic Signature(s) Signed: 01/07/2021 6:17:15 PM By: Baruch Gouty RN, BSN Entered By: Baruch Gouty on 01/07/2021 10:33:21 -------------------------------------------------------------------------------- Encounter Discharge Information Details Patient Name: Date of Service: [name] 01/07/2021 9:00 A M Medical Record Number: [id number] Patient Account Number: [id number] Date of Birth/Sex: Treating RN: [birth year] (75 y.o. Female) Leane Call Primary Care Madelyne Millikan: Cathlean Cower Other Clinician: Referring Verdon Ferrante: Treating Mayo Owczarzak/Extender: Roque Cash in Treatment: 0 Encounter Discharge Information Items  Post Procedure Vitals Discharge Condition: Stable Temperature (F): 98.0 Ambulatory Status: Ambulatory Pulse (bpm): 76 Discharge Destination: Home Respiratory Rate (breaths/min): 18 Transportation: Private Auto Blood Pressure (mmHg): 157/72 Accompanied By: alone Schedule Follow-up Appointment: No Clinical Summary of Care: Electronic Signature(s) Signed: 01/07/2021 5:07:06 PM By: Leane Call Entered By: Leane Call on 01/07/2021 16:50:12 -------------------------------------------------------------------------------- Lower Extremity Assessment Details Patient Name: Date of Service: ANIQUE, BECKLEY 01/07/2021 9:00 A M Medical Record Number: [id number] Patient Account Number: [id number] Date of Birth/Sex: Treating RN: Apr 23, 1946 (75 y.o. Female) Lorrin Jackson Primary Care Mannix Kroeker: Cathlean Cower Other Clinician: Referring Rumaysa Sabatino: Treating Cassidey Barrales/Extender: Roque Cash in Treatment: 0 Edema Assessment Assessed: Shirlyn Goltz: Yes] Patrice Paradise: Yes] Edema: [Left: Yes] [Right: Yes] Calf Left: Right: Point of Measurement: 34 cm From Medial Instep 33 cm 32.5 cm Ankle Left: Right: Point of Measurement: 11 cm From Medial Instep 22 cm 22 cm Vascular Assessment Pulses: Dorsalis Pedis Palpable: [Left:No] [Right:No] Doppler Audible: [Left:Yes] [Right:Yes] Blood Pressure: Brachial: [Right:157] Ankle: [Right:Dorsalis Pedis: 148 0.94] Electronic Signature(s) Signed: 01/07/2021 5:24:26 PM By: Lorrin Jackson Entered By: Lorrin Jackson on 01/07/2021 09:52:41 -------------------------------------------------------------------------------- Multi-Disciplinary Care Plan Details Patient Name: Date of Service: [name] 01/07/2021 9:00 A M Medical Record Number: [id number] Patient Account Number: [id number] Date of Birth/Sex: Treating RN: 09-03-1945 (75 y.o. Female) Baruch Gouty Primary Care Jovanny Stephanie: Cathlean Cower Other Clinician: Referring Robynn Marcel: Treating  Steffie Waggoner/Extender: Roque Cash in Treatment: 0 Multidisciplinary Care Plan reviewed with physician Active Inactive Abuse / Safety / Falls / Self Care Management Nursing Diagnoses: Potential for falls Goals: Patient/caregiver will verbalize/demonstrate measures taken to prevent injury and/or falls Date Initiated: 01/07/2021 Target Resolution Date: 02/04/2021 Goal Status: Active Interventions: Assess fall risk on admission and as needed Assess: immobility, friction, shearing, incontinence upon admission and as needed Notes: Pressure Nursing Diagnoses: Knowledge deficit related to management of pressures ulcers Potential for impaired tissue integrity related to pressure, friction, moisture, and shear Goals: Patient/caregiver will verbalize understanding of pressure ulcer management Date Initiated:  01/07/2021 Target Resolution Date: 02/04/2021 Goal Status: Active Interventions: Assess: immobility, friction, shearing, incontinence upon admission and as needed Assess potential for pressure ulcer upon admission and as needed Provide education on pressure ulcers Notes: Wound/Skin Impairment Nursing Diagnoses: Impaired tissue integrity Knowledge deficit related to ulceration/compromised skin integrity Goals: Patient/caregiver will verbalize understanding of skin care regimen Date Initiated: 01/07/2021 Target Resolution Date: 02/04/2021 Goal Status: Active Ulcer/skin breakdown will have a volume reduction of 30% by week 4 Date Initiated: 01/07/2021 Target Resolution Date: 02/04/2021 Goal Status: Active Interventions: Assess patient/caregiver ability to obtain necessary supplies Assess patient/caregiver ability to perform ulcer/skin care regimen upon admission and as needed Assess ulceration(s) every visit Provide education on ulcer and skin care Treatment Activities: Skin care regimen initiated : 01/07/2021 Topical wound management initiated : 01/07/2021 Notes: Electronic  Signature(s) Signed: 01/07/2021 6:17:15 PM By: Baruch Gouty RN, BSN Entered By: Baruch Gouty on 01/07/2021 10:31:23 -------------------------------------------------------------------------------- Pain Assessment Details Patient Name: Date of Service: [name] 01/07/2021 9:00 A M Medical Record Number: [id number] Patient Account Number: [id number] Date of Birth/Sex: Treating RN: 05-Mar-1946 (75 y.o. Female) Lorrin Jackson Primary Care Kristle Wesch: Cathlean Cower Other Clinician: Referring Numan Zylstra: Treating Jniya Madara/Extender: Roque Cash in Treatment: 0 Active Problems Location of Pain Severity and Description of Pain Patient Has Paino No Site Locations Pain Management and Medication Current Pain Management: Notes Denies pain Electronic Signature(s) Signed: 01/07/2021 5:24:26 PM By: Lorrin Jackson Entered By: Lorrin Jackson on 01/07/2021 09:41:58 -------------------------------------------------------------------------------- Patient/Caregiver Education Details Patient Name: Date of Service: Wolfley, Luvia S. 7/6/2022andnbsp9:00 [place] Record Number: [id number] Patient Account Number: [id number] Date of Birth/Gender: Treating RN: 1945/07/06 (75 y.o. Female) Baruch Gouty Primary Care Physician: Cathlean Cower Other Clinician: Referring Physician: Treating Physician/Extender: Roque Cash in Treatment: 0 Education Assessment Education Provided To: Patient Education Topics Provided Pressure: Handouts: Pressure Ulcers: Care and Offloading, Pressure Ulcers: Care and Offloading 2, Preventing Pressure Ulcers Methods: Explain/Verbal, Printed Responses: Reinforcements needed, State content correctly [place]: o Handouts: Welcome T [facility] o Methods: Explain/Verbal Responses: Reinforcements needed, State content correctly Wound/Skin Impairment: Methods: Explain/Verbal Responses:  Reinforcements needed, State content correctly Electronic Signature(s) Signed: 01/07/2021 6:17:15 PM By: Baruch Gouty RN, BSN Entered By: Baruch Gouty on 01/07/2021 10:32:21 -------------------------------------------------------------------------------- Wound Assessment Details Patient Name: Date of Service: [name] 01/07/2021 9:00 A M Medical Record Number: [id number] Patient Account Number: [id number] Date of Birth/Sex: Treating RN: [birth year] (75 y.o. Female) Lorrin Jackson Primary Care Ninah Moccio: Cathlean Cower Other Clinician: Referring Nehemie Casserly: Treating Vernica Wachtel/Extender: Roque Cash in Treatment: 0 Wound Status Wound Number: 1 Primary Pressure Ulcer Etiology: Wound Location: Right, Posterior Lower Leg Wound Open Wounding Event: Pressure Injury Status: Date Acquired: 06/23/2020 Comorbid Congestive Heart Failure, Coronary Artery Disease, Weeks Of Treatment: 0 History: Hypertension, Type II Diabetes, Paraplegia Clustered Wound: No Wound Measurements Length: (cm) 3.7 Width: (cm) 0.5 Depth: (cm) 0.2 Area: (cm) 1.453 Volume: (cm) 0.291 % Reduction in Area: 0% % Reduction in Volume: 0% Epithelialization: None Tunneling: No Undermining: No Wound Description Classification: Category/Stage III Wound Margin: Distinct, outline attached Exudate Amount: Medium Exudate Type: Serosanguineous Exudate Color: red, brown Foul Odor After Cleansing: No Slough/Fibrino Yes Wound Bed Granulation Amount: Large (67-100%) Exposed Structure Granulation Quality: Red, Pink Fascia Exposed: No Necrotic Amount: Small (1-33%) Fat Layer (Subcutaneous Tissue) Exposed: Yes Necrotic Quality: Adherent Slough Tendon Exposed: No Muscle Exposed: No Joint Exposed: No Bone Exposed: No Treatment Notes Wound #1 (Lower Leg)  Wound Laterality: Right, Posterior Cleanser Peri-Wound Care Topical Primary Dressing Maxorb Extra Calcium Alginate Dressing, 4x4  in Discharge Instruction: Apply calcium alginate to wound bed as instructed Secondary Dressing Zetuvit Plus Silicone Border Dressing 5x5 (in/in) Discharge Instruction: Apply silicone border over primary dressing as directed. Secured With Compression Wrap Compression Stockings Environmental education officer) Signed: 01/07/2021 5:22:20 PM By: Sandre Kitty Signed: 01/07/2021 5:24:26 PM By: Lorrin Jackson Entered By: Sandre Kitty on 01/07/2021 17:14:57 -------------------------------------------------------------------------------- Vitals Details Patient Name: Date of Service: [name] 01/07/2021 9:00 A M Medical Record Number: [id number] Patient Account Number: [id number] Date of Birth/Sex: Treating RN: 06/13/1946 (75 y.o. Female) Lorrin Jackson Primary Care Findley Vi: Cathlean Cower Other Clinician: Referring Cozette Braggs: Treating Kiarah Eckstein/Extender: Roque Cash in Treatment: 0 Vital Signs Time Taken: 09:29 Temperature (F): 98.0 Pulse (bpm): 76 Respiratory Rate (breaths/min): 18 Blood Pressure (mmHg): 157/72 Reference Range: 80 - 120 mg / dl Electronic Signature(s) Signed: 01/07/2021 5:24:26 PM By: Lorrin Jackson Entered By: Lorrin Jackson on 01/07/2021 09:31:19

## 2021-01-07 NOTE — Progress Notes (Signed)
[name] ([id number]) , Visit Report for 01/07/2021 Chief Complaint Document Details Patient Name: Date of Service: Melissa, Cameron 01/07/2021 9:00 A M Medical Record Number: [id number] Patient Account Number: [id number] Date of Birth/Sex: Treating RN: 06/06/1946 (75 y.o. Female) Baruch Gouty Primary Care Provider: Cathlean Cameron Other Clinician: Referring Provider: Treating Provider/Extender: Roque Cash in Treatment: 0 Information Obtained from: Patient Chief Complaint Pressure ulcer right posterior leg from knee immobilizer Electronic Signature(s) Signed: 01/07/2021 10:23:07 AM By: Worthy Keeler PA-C Entered By: Worthy Keeler on 01/07/2021 10:23:06 -------------------------------------------------------------------------------- Debridement Details Patient Name: Date of Service: Melissa Beau. 01/07/2021 9:00 A M Medical Record Number: [id number] Patient Account Number: [id number] Date of Birth/Sex: Treating RN: 01-20-1946 (75 y.o. Female) Baruch Gouty Primary Care Provider: Cathlean Cameron Other Clinician: Referring Provider: Treating Provider/Extender: Roque Cash in Treatment: 0 Debridement Performed for Assessment: Wound #1 Right,Posterior Lower Leg Performed By: Physician Worthy Keeler, PA Debridement Type: Chemical/Enzymatic/Mechanical Agent Used: anasept and gauze Level of Consciousness (Pre-procedure): Awake and Alert Pre-procedure Verification/Time Out No Taken: Bleeding: None Response to Treatment: Procedure was tolerated well Level of Consciousness (Post- Awake and Alert procedure): Post Debridement Measurements of Total Wound Length: (cm) 3.7 Stage: Category/Stage III Width: (cm) 0.5 Depth: (cm) 0.1 Volume: (cm) 0.145 Character of Wound/Ulcer Post Debridement: Improved Post Procedure Diagnosis Same as Pre-procedure Electronic Signature(s) Signed: 01/07/2021 4:43:01 PM By: Worthy Keeler PA-C Signed:  01/07/2021 6:17:15 PM By: Baruch Gouty RN, BSN Entered By: Baruch Gouty on 01/07/2021 13:25:35 -------------------------------------------------------------------------------- HPI Details Patient Name: Date of Service: Melissa Beau. 01/07/2021 9:00 A M Medical Record Number: [id number] Patient Account Number: [id number] Date of Birth/Sex: Treating RN: 07-15-1945 (75 y.o. Female) Baruch Gouty Primary Care Provider: Cathlean Cameron Other Clinician: Referring Provider: Treating Provider/Extender: Roque Cash in Treatment: 0 History of Present Illness HPI Description: 01/07/2021 upon evaluation today patient presents for initial evaluation here in our clinic concerning issues that she has been having with wound on her right posterior lower leg. Fortunately there does not appear to be any signs of infection. This actually occurred in December 2021 when she had broken her leg and subsequently had a knee immobilizer in place. This caused a pressure injury here. Subsequently she also has a CVA that occurred in 2016 leaving her with right-sided weakness. This has led to her having issues with breakdown over this region and this seems to be an area that does tend to breakdown much more quickly unfortunately. She does have a silver alginate and border foam dressing that her son's been using although he tells me is out of stuff now. Her ABI on the right was 0.94 she is on Eliquis but again there is no need for sharp debridement based on what I see today. The patient does have a history of diabetes mellitus type 2, hypertension, congestive heart failure, and coronary artery disease along with a CVA. Electronic Signature(s) Signed: 01/07/2021 10:41:41 AM By: Worthy Keeler PA-C Entered By: Worthy Keeler on 01/07/2021 10:41:41 -------------------------------------------------------------------------------- Physical Exam Details Patient Name: Date of Service: Melissa, Cameron  01/07/2021 9:00 A M Medical Record Number: [id number] Patient Account Number: [id number] Date of Birth/Sex: Treating RN: [birth year] (75 y.o. Female) Baruch Gouty Primary Care Provider: Cathlean Cameron Other Clinician: Referring Provider: Treating Provider/Extender: Roque Cash in Treatment: 0 Constitutional sitting or standing blood pressure is within target range for patient.. pulse regular and within target range for  patient.Marland Kitchen respirations regular, non-labored and within target range for patient.Marland Kitchen temperature within target range for patient.. Well-nourished and well-hydrated in no acute distress. Eyes conjunctiva clear no eyelid edema noted. pupils equal round and reactive to light and accommodation. Ears, Nose, Mouth, and Throat no gross abnormality of ear auricles or external auditory canals. normal hearing noted during conversation. mucus membranes moist. Respiratory normal breathing without difficulty. Cardiovascular 1+ dorsalis pedis/posterior tibialis pulses. trace pitting edema of the bilateral lower extremities. Psychiatric this patient is able to make decisions and demonstrates good insight into disease process. Alert and Oriented x 3. pleasant and cooperative. Notes Upon inspection patient's wound bed actually showed signs of good granulation epithelization at this point. There does not appear to be any signs of active infection which is great news and overall very pleased with where things stand today. No fevers, chills, nausea, vomiting, or diarrhea. From recurrentI do not see any need for sharp debridement and to be perfectly honest I think that the patient is very close to complete resolution. I think the biggest issue is deep tissue injury as a result of pressure to the area they are doing a good job trying to keep things in that regard. Obviously I think that this is going to be the biggest and most effective thing as far as helping the area to heal. She  again does not ambulate due to the weakness due to the Heema plegia following the stroke in 2016. This affected her right side. Electronic Signature(s) Signed: 01/07/2021 10:42:36 AM By: Worthy Keeler PA-C Entered By: Worthy Keeler on 01/07/2021 10:42:36 -------------------------------------------------------------------------------- Physician Orders Details Patient Name: Date of Service: Melissa, Cameron 01/07/2021 9:00 A M Medical Record Number: [id number] Patient Account Number: [id number] Date of Birth/Sex: Treating RN: 02/11/1946 (75 y.o. Female) Baruch Gouty Primary Care Provider: Cathlean Cameron Other Clinician: Referring Provider: Treating Provider/Extender: Roque Cash in Treatment: 0 Verbal / Phone Orders: No Diagnosis Coding ICD-10 Coding Code Description [phone] Pressure ulcer of other site, stage 3 G81.01 Flaccid hemiplegia affecting right dominant side E11.622 Type 2 diabetes mellitus with other skin ulcer I10 Essential (primary) hypertension I50.42 Chronic combined systolic (congestive) and diastolic (congestive) heart failure I25.10 Atherosclerotic heart disease of native coronary artery without angina pectoris Follow-up Appointments Return Appointment in 2 weeks. Bathing/ Shower/ Hygiene May shower and wash wound with soap and water. Off-Loading Other: - keep right lateral leg from resting on bed or recliner with pillows under knee or heel Wound Treatment Wound #1 - Lower Leg Wound Laterality: Right, Posterior Prim Dressing: Maxorb Extra Calcium Alginate Dressing, 4x4 in (DME) (Generic) 3 x Per Week/30 Days ary Discharge Instructions: Apply calcium alginate to wound bed as instructed Secondary Dressing: Zetuvit Plus Silicone Border Dressing 5x5 (in/in) (DME) (Generic) 3 x Per Week/30 Days Discharge Instructions: Apply silicone border over primary dressing as directed. Electronic Signature(s) Signed: 01/07/2021 4:43:01 PM By: Worthy Keeler PA-C Signed: 01/07/2021 6:17:15 PM By: Baruch Gouty RN, BSN Entered By: Baruch Gouty on 01/07/2021 13:26:38 -------------------------------------------------------------------------------- Problem List Details Patient Name: Date of Service: Melissa Beau. 01/07/2021 9:00 A M Medical Record Number: [id number] Patient Account Number: [id number] Date of Birth/Sex: Treating RN: [birth year] (75 y.o. Female) Baruch Gouty Primary Care Provider: Cathlean Cameron Other Clinician: Referring Provider: Treating Provider/Extender: Roque Cash in Treatment: 0 Active Problems ICD-10 Encounter Code Description Active Date MDM Diagnosis L89.893 Pressure ulcer of other site, stage 3 01/07/2021 No Yes G81.01 Flaccid hemiplegia affecting right  dominant side 01/07/2021 No Yes E11.622 Type 2 diabetes mellitus with other skin ulcer 01/07/2021 No Yes I10 Essential (primary) hypertension 01/07/2021 No Yes I50.42 Chronic combined systolic (congestive) and diastolic (congestive) heart failure 01/07/2021 No Yes I25.10 Atherosclerotic heart disease of native coronary artery without angina pectoris 01/07/2021 No Yes Inactive Problems Resolved Problems Electronic Signature(s) Signed: 01/07/2021 10:22:37 AM By: Worthy Keeler PA-C Entered By: Worthy Keeler on 01/07/2021 10:22:34 -------------------------------------------------------------------------------- Progress Note Details Patient Name: Date of Service: Melissa Beau. 01/07/2021 9:00 A M Medical Record Number: [id number] Patient Account Number: [id number] Date of Birth/Sex: Treating RN: 23-Sep-1945 (75 y.o. Female) Baruch Gouty Primary Care Provider: Cathlean Cameron Other Clinician: Referring Provider: Treating Provider/Extender: Roque Cash in Treatment: 0 Subjective Chief Complaint Information obtained from Patient Pressure ulcer right posterior leg from knee immobilizer History of Present Illness  (HPI) 01/07/2021 upon evaluation today patient presents for initial evaluation here in our clinic concerning issues that she has been having with wound on her right posterior lower leg. Fortunately there does not appear to be any signs of infection. This actually occurred in December 2021 when she had broken her leg and subsequently had a knee immobilizer in place. This caused a pressure injury here. Subsequently she also has a CVA that occurred in 2016 leaving her with right-sided weakness. This has led to her having issues with breakdown over this region and this seems to be an area that does tend to breakdown much more quickly unfortunately. She does have a silver alginate and border foam dressing that her son's been using although he tells me is out of stuff now. Her ABI on the right was 0.94 she is on Eliquis but again there is no need for sharp debridement based on what I see today. The patient does have a history of diabetes mellitus type 2, hypertension, congestive heart failure, and coronary artery disease along with a CVA. Patient History Allergies pork-derived products Family History Cancer - Child, Diabetes - Child, Heart Disease - Father,Mother, Hypertension - Father,Mother, No family history of Hereditary Spherocytosis, Seizures, Stroke, Thyroid Problems, Tuberculosis. Social History Former smoker, Marital Status - Married, Alcohol Use - Never, Drug Use - No History, Caffeine Use - Moderate. Medical History Cardiovascular Patient has history of Congestive Heart Failure, Coronary Artery Disease, Hypertension Endocrine Patient has history of Type II Diabetes Neurologic Patient has history of Paraplegia - Hemiplegia right side Patient is treated with Insulin, Oral Agents. Blood sugar is tested. Medical A Surgical History Notes nd Cardiovascular CVA 2016 Musculoskeletal Past fractured femur Review of Systems (ROS) Eyes Denies complaints or symptoms of Dry Eyes, Vision Changes,  Glasses / Contacts. Ear/Nose/Mouth/Throat Denies complaints or symptoms of Chronic sinus problems or rhinitis. Respiratory Denies complaints or symptoms of Chronic or frequent coughs, Shortness of Breath. Gastrointestinal Denies complaints or symptoms of Frequent diarrhea, Nausea, Vomiting. Genitourinary Denies complaints or symptoms of Frequent urination. Integumentary (Skin) Complains or has symptoms of Wounds. Psychiatric Denies complaints or symptoms of Claustrophobia, Suicidal. Objective Constitutional sitting or standing blood pressure is within target range for patient.. pulse regular and within target range for patient.Marland Kitchen respirations regular, non-labored and within target range for patient.Marland Kitchen temperature within target range for patient.. Well-nourished and well-hydrated in no acute distress. Vitals Time Taken: 9:29 AM, Temperature: 98.0 F, Pulse: 76 bpm, Respiratory Rate: 18 breaths/min, Blood Pressure: 157/72 mmHg. Eyes conjunctiva clear no eyelid edema noted. pupils equal round and reactive to light and accommodation. Ears, Nose, Mouth, and Throat no gross  abnormality of ear auricles or external auditory canals. normal hearing noted during conversation. mucus membranes moist. Respiratory normal breathing without difficulty. Cardiovascular 1+ dorsalis pedis/posterior tibialis pulses. trace pitting edema of the bilateral lower extremities. Psychiatric this patient is able to make decisions and demonstrates good insight into disease process. Alert and Oriented x 3. pleasant and cooperative. General Notes: Upon inspection patient's wound bed actually showed signs of good granulation epithelization at this point. There does not appear to be any signs of active infection which is great news and overall very pleased with where things stand today. No fevers, chills, nausea, vomiting, or diarrhea. From recurrentI do not see any need for sharp debridement and to be perfectly honest I  think that the patient is very close to complete resolution. I think the biggest issue is deep tissue injury as a result of pressure to the area they are doing a good job trying to keep things in that regard. Obviously I think that this is going to be the biggest and most effective thing as far as helping the area to heal. She again does not ambulate due to the weakness due to the Heema plegia following the stroke in 2016. This affected her right side. Integumentary (Hair, Skin) Wound #1 status is Open. Original cause of wound was Pressure Injury. The date acquired was: 06/23/2020. The wound is located on the Right,Posterior Lower Leg. The wound measures 3.7cm length x 0.5cm width x 0.2cm depth; 1.453cm^2 area and 0.291cm^3 volume. There is Fat Layer (Subcutaneous Tissue) exposed. There is no tunneling or undermining noted. There is a medium amount of serosanguineous drainage noted. The wound margin is distinct with the outline attached to the wound base. There is large (67-100%) red, pink granulation within the wound bed. There is a small (1-33%) amount of necrotic tissue within the wound bed including Adherent Slough. Assessment Active Problems ICD-10 Pressure ulcer of other site, stage 3 Flaccid hemiplegia affecting right dominant side Type 2 diabetes mellitus with other skin ulcer Essential (primary) hypertension Chronic combined systolic (congestive) and diastolic (congestive) heart failure Atherosclerotic heart disease of native coronary artery without angina pectoris Procedures Wound #1 Pre-procedure diagnosis of Wound #1 is a Pressure Ulcer located on the Right,Posterior Lower Leg . There was a Chemical/Enzymatic/Mechanical debridement performed by Worthy Keeler, PA.. Other agent used was anasept and gauze. There was no bleeding. The procedure was tolerated well. Post Debridement Measurements: 3.7cm length x 0.5cm width x 0.1cm depth; 0.145cm^3 volume. Post debridement Stage noted as  Category/Stage III. Character of Wound/Ulcer Post Debridement is improved. Post procedure Diagnosis Wound #1: Same as Pre-Procedure Plan Follow-up Appointments: Return Appointment in 2 weeks. Bathing/ Shower/ Hygiene: May shower and wash wound with soap and water. Off-Loading: Other: - keep right lateral leg from resting on bed or recliner with pillows under knee or heel WOUND #1: - Lower Leg Wound Laterality: Right, Posterior Prim Dressing: Maxorb Extra Calcium Alginate Dressing, 4x4 in 3 x Per Week/30 Days ary Discharge Instructions: Apply calcium alginate to wound bed as instructed Secondary Dressing: Zetuvit Plus Silicone Border Dressing 5x5 (in/in) 3 x Per Week/30 Days Discharge Instructions: Apply silicone border over primary dressing as directed. 1. Would recommend currently that we going continue with the wound care measures as before with the silver alginate dressing they have been utilized and I think this is still appropriate. 2. Also can recommend the patient continue to monitor for any signs of worsening infection and her son will do this he changes her dressings  he takes excellent care of her I can tell. 3. I am also can recommend that we have the patient use a border foam dressing to cover I think this is still good. 4. With regard to offloading they have a boot for her foot to keep this elevated and keep the pressure off of the leg as well I think this is great and subsequently I would recommend they continue as such with this. We will see patient back for reevaluation in 1 week here in the clinic. If anything worsens or changes patient will contact our office for additional recommendations. Electronic Signature(s) Signed: 01/07/2021 4:43:01 PM By: Worthy Keeler PA-C Signed: 01/07/2021 6:17:15 PM By: Baruch Gouty RN, BSN Previous Signature: 01/07/2021 10:43:17 AM Version By: Worthy Keeler PA-C Entered By: Baruch Gouty on 01/07/2021  13:25:51 -------------------------------------------------------------------------------- HxROS Details Patient Name: Date of Service: Melissa Beau. 01/07/2021 9:00 A M Medical Record Number: [id number] Patient Account Number: [id number] Date of Birth/Sex: Treating RN: 29-Nov-1945 (75 y.o. Female) Lorrin Jackson Primary Care Provider: Cathlean Cameron Other Clinician: Referring Provider: Treating Provider/Extender: Roque Cash in Treatment: 0 Eyes Complaints and Symptoms: Negative for: Dry Eyes; Vision Changes; Glasses / Contacts Ear/Nose/Mouth/Throat Complaints and Symptoms: Negative for: Chronic sinus problems or rhinitis Respiratory Complaints and Symptoms: Negative for: Chronic or frequent coughs; Shortness of Breath Gastrointestinal Complaints and Symptoms: Negative for: Frequent diarrhea; Nausea; Vomiting Genitourinary Complaints and Symptoms: Negative for: Frequent urination Integumentary (Skin) Complaints and Symptoms: Positive for: Wounds Psychiatric Complaints and Symptoms: Negative for: Claustrophobia; Suicidal Hematologic/Lymphatic Cardiovascular Medical History: Positive for: Congestive Heart Failure; Coronary Artery Disease; Hypertension Past Medical History Notes: CVA 2016 Endocrine Medical History: Positive for: Type II Diabetes Time with diabetes: 6 years Treated with: Insulin, Oral agents Blood sugar tested every day: Yes Tested : 3x day Immunological Musculoskeletal Medical History: Past Medical History Notes: Past fractured femur Neurologic Medical History: Positive for: Paraplegia - Hemiplegia right side Oncologic Immunizations Pneumococcal Vaccine: Received Pneumococcal Vaccination: Yes Implantable Devices No devices added Family and Social History Cancer: Yes - Child; Diabetes: Yes - Child; Heart Disease: Yes - Father,Mother; Hereditary Spherocytosis: No; Hypertension: Yes - Father,Mother; Seizures: No; Stroke: No;  Thyroid Problems: No; Tuberculosis: No; Former smoker; Marital Status - Married; Alcohol Use: Never; Drug Use: No History; Caffeine Use: Moderate; Financial Concerns: No; Food, Clothing or Shelter Needs: No; Support System Lacking: No; Transportation Concerns: No Electronic Signature(s) Signed: 01/07/2021 4:43:01 PM By: Worthy Keeler PA-C Signed: 01/07/2021 5:24:26 PM By: Lorrin Jackson Entered By: Lorrin Jackson on 01/07/2021 09:38:39 -------------------------------------------------------------------------------- SuperBill Details Patient Name: Date of Service: Melissa Beau 01/07/2021 Medical Record Number: [id number] Patient Account Number: [id number] Date of Birth/Sex: Treating RN: [birth year] (75 y.o. Female) Baruch Gouty Primary Care Provider: Cathlean Cameron Other Clinician: Referring Provider: Treating Provider/Extender: Roque Cash in Treatment: 0 Diagnosis Coding ICD-10 Codes Code Description [phone] Pressure ulcer of other site, stage 3 G81.01 Flaccid hemiplegia affecting right dominant side E11.622 Type 2 diabetes mellitus with other skin ulcer I10 Essential (primary) hypertension I50.42 Chronic combined systolic (congestive) and diastolic (congestive) heart failure I25.10 Atherosclerotic heart disease of native coronary artery without angina pectoris Facility Procedures CPT4 Code: [id number] Description: 99214 - WOUND CARE VISIT-LEV 4 EST PT Modifier: Quantity: 1 Physician Procedures : CPT4 Code Description Modifier [id number] - WC PHYS LEVEL 4 - NEW PT ICD-10 Diagnosis Description L89.893 Pressure ulcer of other site, stage 3 G81.01 Flaccid hemiplegia affecting right dominant side E11.622 Type 2 diabetes  mellitus with other skin  ulcer I10 Essential (primary) hypertension Quantity: 1 Electronic Signature(s) Signed: 01/07/2021 10:43:33 AM By: Worthy Keeler PA-C Entered By: Worthy Keeler on 01/07/2021 10:43:30

## 2021-01-07 NOTE — Progress Notes (Signed)
[name] ([id number]) , Visit Report for 01/07/2021 Abuse/Suicide Risk Screen Details Patient Name: Date of Service: [name], [name] 01/07/2021 9:00 A M Medical Record Number: [id number] Patient Account Number: [id number] Date of Birth/Sex: Treating RN: 02/20/1946 (75 y.o. Female) Lorrin Jackson Primary Care Grayson Pfefferle: Cathlean Cower Other Clinician: Referring Natale Thoma: Treating Bishoy Cupp/Extender: Roque Cash in Treatment: 0 Abuse/Suicide Risk Screen Items Answer ABUSE RISK SCREEN: Has anyone close to you tried to hurt or harm you recentlyo No Do you feel uncomfortable with anyone in your familyo No Has anyone forced you do things that you didnt want to doo No Electronic Signature(s) Signed: 01/07/2021 5:24:26 PM By: Lorrin Jackson Entered By: Lorrin Jackson on 01/07/2021 09:38:47 -------------------------------------------------------------------------------- Activities of Daily Living Details Patient Name: Date of Service: [name], [name] 01/07/2021 9:00 A M Medical Record Number: [id number] Patient Account Number: [id number] Date of Birth/Sex: Treating RN: 01-05-1946 (75 y.o. Female) Lorrin Jackson Primary Care Ottilia Pippenger: Cathlean Cower Other Clinician: Referring Ciella Obi: Treating Stepanie Graver/Extender: Roque Cash in Treatment: 0 Activities of Daily Living Items Answer Activities of Daily Living (Please select one for each item) Drive Automobile Not Able T Medications ake Need Assistance Use T elephone Need Assistance Care for Appearance Need Assistance Use T oilet Need Assistance Bath / Shower Not Able Dress Self Need Assistance Feed Self Completely Able Walk Need Assistance Get In / Out Bed Need Assistance Housework Not Able Prepare Meals Not Able Handle Money Need Assistance Shop for Self Not Able Electronic Signature(s) Signed: 01/07/2021 5:24:26 PM By: Lorrin Jackson Entered By: Lorrin Jackson on 01/07/2021  09:39:45 -------------------------------------------------------------------------------- Education Screening Details Patient Name: Date of Service: [name] 01/07/2021 9:00 A M Medical Record Number: [id number] Patient Account Number: [id number] Date of Birth/Sex: Treating RN: 1945-07-21 (75 y.o. Female) Lorrin Jackson Primary Care Leva Baine: Cathlean Cower Other Clinician: Referring Naphtali Zywicki: Treating Refujio Haymer/Extender: Roque Cash in Treatment: 0 Primary Learner Assessed: Caregiver son Reason Patient is not Primary Learner: Caregiver, patient had CVA Learning Preferences/Education Level/Primary Language Learning Preference: Explanation, Demonstration, Printed Material Highest Education Level: High School Preferred Language: English Cognitive Barrier Language Barrier: No Translator Needed: No Memory Deficit: No Emotional Barrier: No Cultural/Religious Beliefs Affecting Medical Care: No Physical Barrier Impaired Vision: No Impaired Hearing: No Decreased Hand dexterity: No Knowledge/Comprehension Knowledge Level: Medium Comprehension Level: Medium Ability to understand written instructions: Medium Ability to understand verbal instructions: Medium Motivation Anxiety Level: Calm Cooperation: Cooperative Education Importance: Acknowledges Need Interest in Health Problems: Asks Questions Perception: Coherent Willingness to Engage in Self-Management Medium Activities: Readiness to Engage in Self-Management Medium Activities: Electronic Signature(s) Signed: 01/07/2021 5:24:26 PM By: Lorrin Jackson Entered By: Lorrin Jackson on 01/07/2021 09:40:51 -------------------------------------------------------------------------------- Fall Risk Assessment Details Patient Name: Date of Service: [name]. 01/07/2021 9:00 A M Medical Record Number: [id number] Patient Account Number: [id number] Date of Birth/Sex: Treating RN: 31-Jul-1945 (75 y.o. Female)  Lorrin Jackson Primary Care Zander Ingham: Cathlean Cower Other Clinician: Referring Jennavie Martinek: Treating Renn Dirocco/Extender: Roque Cash in Treatment: 0 Fall Risk Assessment Items Have you had 2 or more falls in the last 12 monthso 0 Yes Have you had any fall that resulted in injury in the last 12 monthso 0 No FALLS RISK SCREEN History of falling - immediate or within 3 months 0 No Secondary diagnosis (Do you have 2 or more medical diagnoseso) 0 No Ambulatory aid None/bed rest/wheelchair/nurse 0 Yes Crutches/cane/walker 0 No Furniture 0 No Intravenous therapy Access/Saline/Heparin Lock 0 No Gait/Transferring  Normal/ bed rest/ wheelchair 0 Yes Weak (short steps with or without shuffle, stooped but able to lift head while walking, may seek 10 Yes support from furniture) Impaired (short steps with shuffle, may have difficulty arising from chair, head down, impaired 0 No balance) Mental Status Oriented to own ability 0 Yes Electronic Signature(s) Signed: 01/07/2021 5:24:26 PM By: Lorrin Jackson Entered By: Lorrin Jackson on 01/07/2021 09:41:22 -------------------------------------------------------------------------------- Foot Assessment Details Patient Name: Date of Service: [name]. 01/07/2021 9:00 A M Medical Record Number: [id number] Patient Account Number: [id number] Date of Birth/Sex: Treating RN: 05/18/1946 (75 y.o. Female) Lorrin Jackson Primary Care Charizma Gardiner: Cathlean Cower Other Clinician: Referring Arnita Koons: Treating Rebecah Dangerfield/Extender: Roque Cash in Treatment: 0 Foot Assessment Items [x]  Unable to perform due to altered mental status Site Locations + = Sensation present, - = Sensation absent, C = Callus, U = Ulcer R = Redness, W = Warmth, M = Maceration, PU = Pre-ulcerative lesion F = Fissure, S = Swelling, D = Dryness Assessment Right: Left: Other Deformity: No No Prior Foot Ulcer: No No Prior Amputation: No No Charcot  Joint: No No Ambulatory Status: Ambulatory With Help Assistance Device: Wheelchair Gait: Steady Electronic Signature(s) Signed: 01/07/2021 5:24:26 PM By: Lorrin Jackson Entered By: Lorrin Jackson on 01/07/2021 09:50:26 -------------------------------------------------------------------------------- Nutrition Risk Screening Details Patient Name: Date of Service: [name], [name] 01/07/2021 9:00 A M Medical Record Number: [id number] Patient Account Number: [id number] Date of Birth/Sex: Treating RN: 1946/04/15 (75 y.o. Female) Lorrin Jackson Primary Care Jadalee Westcott: Cathlean Cower Other Clinician: Referring Jonita Hirota: Treating Zayde Stroupe/Extender: Roque Cash in Treatment: 0 Height (in): Weight (lbs): Body Mass Index (BMI): Nutrition Risk Screening Items Score Screening NUTRITION RISK SCREEN: I have an illness or condition that made me change the kind and/or amount of food I eat 0 No I eat fewer than two meals per day 0 No I eat few fruits and vegetables, or milk products 0 No I have three or more drinks of beer, liquor or wine almost every day 0 No I have tooth or mouth problems that make it hard for me to eat 0 No I don't always have enough money to buy the food I need 0 No I eat alone most of the time 0 No I take three or more different prescribed or over-the-counter drugs a day 1 Yes Without wanting to, I have lost or gained 10 pounds in the last six months 0 No I am not always physically able to shop, cook and/or feed myself 0 No Nutrition Protocols Good Risk Protocol 0 No interventions needed Moderate Risk Protocol High Risk Proctocol Risk Level: Good Risk Score: 1 Electronic Signature(s) Signed: 01/07/2021 5:24:26 PM By: Lorrin Jackson Entered By: Lorrin Jackson on 01/07/2021 09:41:33

## 2021-01-12 ENCOUNTER — Other Ambulatory Visit: Payer: Self-pay | Admitting: Internal Medicine

## 2021-01-19 ENCOUNTER — Telehealth: Payer: Self-pay

## 2021-01-19 NOTE — Telephone Encounter (Signed)
  Care Management  Care Management Phone Note  01/19/2021 Name: [name] MRN: [id number] DOB: 03-15-1946 [name] is a 75 y.o. year old female who is a primary care patient of Biagio Borg, MD.   The Care Management team was consulted to assess patient's needs after disconnection with services provided by [facility].  Second unsuccessful outreach was made by telephone today to assess patient care needs post discontinuation of [facility].   Phone not in service   Plan: The care management team will attempt to reach patient again to assess care needs post [facility] discontinuation.    [name], RN, BSN, CEN [facility] [employer] [phone]

## 2021-01-23 ENCOUNTER — Telehealth: Payer: Self-pay

## 2021-01-23 NOTE — Telephone Encounter (Signed)
  Care Management  Care Management Phone Note  01/23/2021 Name: [name] MRN: [id number] DOB: 1945-11-14 [name] is a 75 y.o. year old female who is a primary care patient of Biagio Borg, MD.   The Care Management team was consulted to assess patient's needs after disconnection with services provided by [facility].  Third unsuccessful outreach was made by telephone today to assess patient care needs post discontinuation of [facility].    Plan: The care management team has been unsuccessful x 3 in attempting to reach the patient to assess care needs post [facility] discontinuation. Please use [id number] referral order to refer the patient to the embedded care coordination team if the patient is in need of care management/care coordination needs in the future.    [name], RN, BSN, CEN [facility] [employer] [phone]

## 2021-01-23 NOTE — Telephone Encounter (Signed)
  Care Management  Care Management Phone Note  01/23/2021 Name: [name] MRN: [id number] DOB: 05-May-1946 [name] is a 75 y.o. year old female who is a primary care patient of Biagio Borg, MD.   The Care Management team was consulted to assess patient's needs after disconnection with services provided by [facility].  Third unsuccessful outreach was made by telephone today to assess patient care needs post discontinuation of [facility].   Plan: The care management team has been unsuccessful x 3 in attempting to reach the patient to assess care needs post [facility] discontinuation. Please use [id number] referral order to refer the patient to the embedded care coordination team if the patient is in need of care management/care coordination needs in the future.    [name], RN, BSN, CEN [facility] [employer] [phone]

## 2021-01-28 ENCOUNTER — Encounter (HOSPITAL_BASED_OUTPATIENT_CLINIC_OR_DEPARTMENT_OTHER): Payer: Medicare Other | Admitting: Physician Assistant

## 2021-02-15 ENCOUNTER — Other Ambulatory Visit: Payer: Self-pay | Admitting: Internal Medicine

## 2021-02-15 NOTE — Telephone Encounter (Signed)
Please refill as per office routine med refill policy (all routine meds refilled for 3 mo or monthly per pt preference up to one year from last visit, then month to month grace period for 3 mo, then further med refills will have to be denied)  

## 2021-03-04 ENCOUNTER — Encounter (HOSPITAL_BASED_OUTPATIENT_CLINIC_OR_DEPARTMENT_OTHER): Payer: Medicare Other | Attending: Physician Assistant | Admitting: Physician Assistant

## 2021-03-04 ENCOUNTER — Other Ambulatory Visit: Payer: Self-pay

## 2021-03-04 DIAGNOSIS — I110 Hypertensive heart disease with heart failure: Secondary | ICD-10-CM | POA: Diagnosis not present

## 2021-03-04 DIAGNOSIS — G8220 Paraplegia, unspecified: Secondary | ICD-10-CM | POA: Diagnosis not present

## 2021-03-04 DIAGNOSIS — I69351 Hemiplegia and hemiparesis following cerebral infarction affecting right dominant side: Secondary | ICD-10-CM | POA: Diagnosis not present

## 2021-03-04 DIAGNOSIS — Z7901 Long term (current) use of anticoagulants: Secondary | ICD-10-CM | POA: Diagnosis not present

## 2021-03-04 DIAGNOSIS — E11622 Type 2 diabetes mellitus with other skin ulcer: Secondary | ICD-10-CM | POA: Insufficient documentation

## 2021-03-04 DIAGNOSIS — L89893 Pressure ulcer of other site, stage 3: Secondary | ICD-10-CM | POA: Diagnosis not present

## 2021-03-04 DIAGNOSIS — I5042 Chronic combined systolic (congestive) and diastolic (congestive) heart failure: Secondary | ICD-10-CM | POA: Insufficient documentation

## 2021-03-04 NOTE — Progress Notes (Addendum)
SAGEN VOILS ([id number]) , Visit Report for 03/04/2021 Chief Complaint Document Details Patient Name: Date of Service: [name], [name] 03/04/2021 10:30 A M Medical Record Number: [id number] Patient Account Number: [id number] Date of Birth/Sex: Treating RN: Aug 18, 1945 (75 y.o. [name] Primary Care Provider: Cathlean Cower Other Clinician: Referring Provider: Treating Provider/Extender: Roque Cash in Treatment: 8 Information Obtained from: Patient Chief Complaint Pressure ulcer right posterior leg from knee immobilizer Electronic Signature(s) Signed: 03/04/2021 11:08:05 AM By: Worthy Keeler PA-C Entered By: Worthy Keeler on 03/04/2021 11:08:04 -------------------------------------------------------------------------------- HPI Details Patient Name: Date of Service: Melissa Beau. 03/04/2021 10:30 A M Medical Record Number: [id number] Patient Account Number: [id number] Date of Birth/Sex: Treating RN: [birth year] (76 y.o. [name] Primary Care Provider: Cathlean Cower Other Clinician: Referring Provider: Treating Provider/Extender: Roque Cash in Treatment: 8 History of Present Illness HPI Description: 01/07/2021 upon evaluation today patient presents for initial evaluation here in our clinic concerning issues that she has been having with wound on her right posterior lower leg. Fortunately there does not appear to be any signs of infection. This actually occurred in December 2021 when she had broken her leg and subsequently had a knee immobilizer in place. This caused a pressure injury here. Subsequently she also has a CVA that occurred in 2016 leaving her with right-sided weakness. This has led to her having issues with breakdown over this region and this seems to be an area that does tend to breakdown much more quickly unfortunately. She does have a silver alginate and border foam dressing that her son's been using  although he tells me is out of stuff now. Her ABI on the right was 0.94 she is on Eliquis but again there is no need for sharp debridement based on what I see today. The patient does have a history of diabetes mellitus type 2, hypertension, congestive heart failure, and coronary artery disease along with a CVA. 03/04/2021 upon evaluation today patient's leg ulcer actually appears to be doing roughly the same as when I last saw it. Apparently had gotten a lot better and then her caregiver tells me that he noticed that her leg slipped off a couple nights in a row and was on the bed resting and that seems to have caused things to worsen. For that reason she did come back in for reevaluation she also has some swelling of the leg I potentially think we could do something for her to try to offload and keep some of the pressure off of this region. She is in agreement with giving this a trial. Fortunately there does not appear to be any signs of active infection at this time which is great news. Electronic Signature(s) Signed: 03/04/2021 5:02:47 PM By: Worthy Keeler PA-C Entered By: Worthy Keeler on 03/04/2021 17:02:47 -------------------------------------------------------------------------------- Physical Exam Details Patient Name: Date of Service: [name], [name] 03/04/2021 10:30 A M Medical Record Number: [id number] Patient Account Number: [id number] Date of Birth/Sex: Treating RN: 04-25-1946 (75 y.o. [name] Primary Care Provider: Cathlean Cower Other Clinician: Referring Provider: Treating Provider/Extender: Roque Cash in Treatment: 8 Constitutional Well-nourished and well-hydrated in no acute distress. Respiratory normal breathing without difficulty. Psychiatric this patient is able to make decisions and demonstrates good insight into disease process. Alert and Oriented x 3. pleasant and cooperative. Notes Upon inspection patient's wound bed showed signs of  good granulation epithelization at this point. Fortunately there does not  appear to be any evidence of active infection the good news is I think that the wound can heal I do believe maybe switching to [facility] may be a good option I think getting home health will also be helpful as I really would like to wrap the leg and I think the edema is part of the issue here as well. Electronic Signature(s) Signed: 03/04/2021 5:03:02 PM By: Worthy Keeler PA-C Entered By: Worthy Keeler on 03/04/2021 17:03:02 -------------------------------------------------------------------------------- Physician Orders Details Patient Name: Date of Service: [name], [name] 03/04/2021 10:30 A M Medical Record Number: [id number] Patient Account Number: [id number] Date of Birth/Sex: Treating RN: 1945-11-08 (75 y.o. [name] Primary Care Provider: Cathlean Cower Other Clinician: Referring Provider: Treating Provider/Extender: Roque Cash in Treatment: 8 Verbal / Phone Orders: No Diagnosis Coding ICD-10 Coding Code Description [id number] Pressure ulcer of other site, stage 3 G81.01 Flaccid hemiplegia affecting right dominant side E11.622 Type 2 diabetes mellitus with other skin ulcer I10 Essential (primary) hypertension I50.42 Chronic combined systolic (congestive) and diastolic (congestive) heart failure I25.10 Atherosclerotic heart disease of native coronary artery without angina pectoris Follow-up Appointments ppointment in 1 week. - with Margarita Grizzle, if accepted by [facility] then can make 2 week appt. Return A Bathing/ Shower/ Hygiene May shower with protection but do not get wound dressing(s) wet. Off-Loading Other: - keep right lateral leg from resting on bed or recliner with pillows under knee or heel Home Health dmit to [place] for wound care. May utilize formulary equivalent dressing for wound treatment orders unless otherwise specified. - A Will send out referrals for skilled  nursing wound care 2x week. Wound Treatment Wound #1 - Lower Leg Wound Laterality: Right, Posterior Cleanser: Soap and Water 2 x Per Week/30 Days Discharge Instructions: May shower and wash wound with dial antibacterial soap and water prior to dressing change. Cleanser: Wound Cleanser 2 x Per Week/30 Days Discharge Instructions: Cleanse the wound with wound cleanser prior to applying a clean dressing using gauze sponges, not tissue or cotton balls. Peri-Wound Care: Triamcinolone 15 (g) 2 x Per Week/30 Days Discharge Instructions: Apply to leg under wrap Peri-Wound Care: Zinc Oxide Ointment 30g tube 2 x Per Week/30 Days Discharge Instructions: T periwound o Peri-Wound Care: Sween Lotion (Moisturizing lotion) 2 x Per Week/30 Days Discharge Instructions: Apply to leg under wrap Prim Dressing: Hydrofera Blue Ready Foam, 4x5 in (DME) (Generic) 2 x Per Week/30 Days ary Discharge Instructions: Apply to wound bed as instructed Secondary Dressing: Woven Gauze Sponge, Non-Sterile 4x4 in (DME) (Generic) 2 x Per Week/30 Days Discharge Instructions: Apply over primary dressing as directed. Secondary Dressing: ABD Pad, 5x9 (DME) (Generic) 2 x Per Week/30 Days Discharge Instructions: Apply over primary dressing as directed. Compression Wrap: ThreePress (3 layer compression wrap) (DME) (Generic) 2 x Per Week/30 Days Discharge Instructions: Apply three layer compression as directed. Electronic Signature(s) Signed: 03/04/2021 5:21:16 PM By: Worthy Keeler PA-C Signed: 03/04/2021 5:28:58 PM By: Lorrin Jackson Entered By: Lorrin Jackson on 03/04/2021 11:55:12 -------------------------------------------------------------------------------- Problem List Details Patient Name: Date of Service: Melissa Beau. 03/04/2021 10:30 A M Medical Record Number: [id number] Patient Account Number: [id number] Date of Birth/Sex: Treating RN: Dec 23, 1945 (75 y.o. [name] Primary Care Provider: Cathlean Cower Other  Clinician: Referring Provider: Treating Provider/Extender: Roque Cash in Treatment: 8 Active Problems ICD-10 Encounter Code Description Active Date MDM Diagnosis L89.893 Pressure ulcer of other site, stage 3 01/07/2021 No Yes G81.01 Flaccid hemiplegia affecting  right dominant side 01/07/2021 No Yes E11.622 Type 2 diabetes mellitus with other skin ulcer 01/07/2021 No Yes I10 Essential (primary) hypertension 01/07/2021 No Yes I50.42 Chronic combined systolic (congestive) and diastolic (congestive) heart failure 01/07/2021 No Yes I25.10 Atherosclerotic heart disease of native coronary artery without angina pectoris 01/07/2021 No Yes Inactive Problems Resolved Problems Electronic Signature(s) Signed: 03/04/2021 11:07:53 AM By: Worthy Keeler PA-C Entered By: Worthy Keeler on 03/04/2021 11:07:52 -------------------------------------------------------------------------------- Progress Note Details Patient Name: Date of Service: Melissa Beau. 03/04/2021 10:30 A M Medical Record Number: [id number] Patient Account Number: [id number] Date of Birth/Sex: Treating RN: 1946-04-27 (75 y.o. [name] Primary Care Provider: Cathlean Cower Other Clinician: Referring Provider: Treating Provider/Extender: Roque Cash in Treatment: 8 Subjective Chief Complaint Information obtained from Patient Pressure ulcer right posterior leg from knee immobilizer History of Present Illness (HPI) 01/07/2021 upon evaluation today patient presents for initial evaluation here in our clinic concerning issues that she has been having with wound on her right posterior lower leg. Fortunately there does not appear to be any signs of infection. This actually occurred in December 2021 when she had broken her leg and subsequently had a knee immobilizer in place. This caused a pressure injury here. Subsequently she also has a CVA that occurred in 2016 leaving her with right-sided  weakness. This has led to her having issues with breakdown over this region and this seems to be an area that does tend to breakdown much more quickly unfortunately. She does have a silver alginate and border foam dressing that her son's been using although he tells me is out of stuff now. Her ABI on the right was 0.94 she is on Eliquis but again there is no need for sharp debridement based on what I see today. The patient does have a history of diabetes mellitus type 2, hypertension, congestive heart failure, and coronary artery disease along with a CVA. 03/04/2021 upon evaluation today patient's leg ulcer actually appears to be doing roughly the same as when I last saw it. Apparently had gotten a lot better and then her caregiver tells me that he noticed that her leg slipped off a couple nights in a row and was on the bed resting and that seems to have caused things to worsen. For that reason she did come back in for reevaluation she also has some swelling of the leg I potentially think we could do something for her to try to offload and keep some of the pressure off of this region. She is in agreement with giving this a trial. Fortunately there does not appear to be any signs of active infection at this time which is great news. Objective Constitutional Well-nourished and well-hydrated in no acute distress. Vitals Time Taken: 10:41 AM, Temperature: 97.9 F, Pulse: 67 bpm, Respiratory Rate: 18 breaths/min, Blood Pressure: 135/82 mmHg. Respiratory normal breathing without difficulty. Psychiatric this patient is able to make decisions and demonstrates good insight into disease process. Alert and Oriented x 3. pleasant and cooperative. General Notes: Upon inspection patient's wound bed showed signs of good granulation epithelization at this point. Fortunately there does not appear to be any evidence of active infection the good news is I think that the wound can heal I do believe maybe switching to  [facility] may be a good option I think getting home health will also be helpful as I really would like to wrap the leg and I think the edema is part of the issue  here as well. Integumentary (Hair, Skin) Wound #1 status is Open. Original cause of wound was Pressure Injury. The date acquired was: 06/23/2020. The wound has been in treatment 8 weeks. The wound is located on the Right,Posterior Lower Leg. The wound measures 3cm length x 1cm width x 0.2cm depth; 2.356cm^2 area and 0.471cm^3 volume. There is Fat Layer (Subcutaneous Tissue) exposed. There is no tunneling or undermining noted. There is a medium amount of serosanguineous drainage noted. The wound margin is distinct with the outline attached to the wound base. There is large (67-100%) red, pink granulation within the wound bed. There is a small (1- 33%) amount of necrotic tissue within the wound bed including Adherent Slough. Assessment Active Problems ICD-10 Pressure ulcer of other site, stage 3 Flaccid hemiplegia affecting right dominant side Type 2 diabetes mellitus with other skin ulcer Essential (primary) hypertension Chronic combined systolic (congestive) and diastolic (congestive) heart failure Atherosclerotic heart disease of native coronary artery without angina pectoris Procedures Wound #1 Pre-procedure diagnosis of Wound #1 is a Pressure Ulcer located on the Right,Posterior Lower Leg . There was a Three Layer Compression Therapy Procedure by Lorrin Jackson, RN. Post procedure Diagnosis Wound #1: Same as Pre-Procedure Plan Follow-up Appointments: Return Appointment in 1 week. - with Margarita Grizzle, if accepted by [facility] then can make 2 week appt. Bathing/ Shower/ Hygiene: May shower with protection but do not get wound dressing(s) wet. Off-Loading: Other: - keep right lateral leg from resting on bed or recliner with pillows under knee or heel Home Health: Admit to [place] for wound care. May utilize formulary equivalent  dressing for wound treatment orders unless otherwise specified. - Will send out referrals for skilled nursing wound care 2x week. WOUND #1: - Lower Leg Wound Laterality: Right, Posterior Cleanser: Soap and Water 2 x Per Week/30 Days Discharge Instructions: May shower and wash wound with dial antibacterial soap and water prior to dressing change. Cleanser: Wound Cleanser 2 x Per Week/30 Days Discharge Instructions: Cleanse the wound with wound cleanser prior to applying a clean dressing using gauze sponges, not tissue or cotton balls. Peri-Wound Care: Triamcinolone 15 (g) 2 x Per Week/30 Days Discharge Instructions: Apply to leg under wrap Peri-Wound Care: Zinc Oxide Ointment 30g tube 2 x Per Week/30 Days Discharge Instructions: T periwound o Peri-Wound Care: Sween Lotion (Moisturizing lotion) 2 x Per Week/30 Days Discharge Instructions: Apply to leg under wrap Prim Dressing: Hydrofera Blue Ready Foam, 4x5 in (DME) (Generic) 2 x Per Week/30 Days ary Discharge Instructions: Apply to wound bed as instructed Secondary Dressing: Woven Gauze Sponge, Non-Sterile 4x4 in (DME) (Generic) 2 x Per Week/30 Days Discharge Instructions: Apply over primary dressing as directed. Secondary Dressing: ABD Pad, 5x9 (DME) (Generic) 2 x Per Week/30 Days Discharge Instructions: Apply over primary dressing as directed. Com pression Wrap: ThreePress (3 layer compression wrap) (DME) (Generic) 2 x Per Week/30 Days Discharge Instructions: Apply three layer compression as directed. 1. We will get a go ahead and see about getting home health which I think is absolutely appropriate. 2. I am also can recommend that we have the patient going to continue with the [facility] along with ABD pad to cover and a 3 layer compression wrap. 3. I am also going to recommend that we have the patient supplies ordered so that when home health does come out will have something to get things started I think this will benefit them as  well ongoing they will have to order their own supplies. We will see  patient back for reevaluation in 1 week here in the clinic. If anything worsens or changes patient will contact our office for additional recommendations. In the meantime the patient should continue with aggressive offloading her caregiver states he would definitely keep working on that. Electronic Signature(s) Signed: 03/04/2021 5:03:49 PM By: Worthy Keeler PA-C Entered By: Worthy Keeler on 03/04/2021 17:03:49 -------------------------------------------------------------------------------- SuperBill Details Patient Name: Date of Service: [name], [name] 03/04/2021 Medical Record Number: [id number] Patient Account Number: [id number] Date of Birth/Sex: Treating RN: Jan 06, 1946 (75 y.o. [name] Primary Care Provider: Cathlean Cower Other Clinician: Referring Provider: Treating Provider/Extender: Roque Cash in Treatment: 8 Diagnosis Coding ICD-10 Codes Code Description [phone] Pressure ulcer of other site, stage 3 G81.01 Flaccid hemiplegia affecting right dominant side E11.622 Type 2 diabetes mellitus with other skin ulcer I10 Essential (primary) hypertension I50.42 Chronic combined systolic (congestive) and diastolic (congestive) heart failure I25.10 Atherosclerotic heart disease of native coronary artery without angina pectoris Facility Procedures CPT4 Code: [id number] Description: (Facility Use Only) [phone] - APPLY MULTLAY COMPRS LWR RT LEG ICD-10 Diagnosis Description L89.893 Pressure ulcer of other site, stage 3 Modifier: Quantity: 1 Physician Procedures : CPT4 Code Description Modifier [id number] - WC PHYS LEVEL 4 - EST PT ICD-10 Diagnosis Description L89.893 Pressure ulcer of other site, stage 3 G81.01 Flaccid hemiplegia affecting right dominant side E11.622 Type 2 diabetes mellitus with other skin  ulcer I10 Essential (primary) hypertension Quantity: 1 Electronic  Signature(s) Signed: 03/04/2021 5:04:08 PM By: Worthy Keeler PA-C Entered By: Worthy Keeler on 03/04/2021 17:04:08

## 2021-03-04 NOTE — Progress Notes (Addendum)
[name] ([id number]) , Visit Report for 03/04/2021 Arrival Information Details Patient Name: Date of Service: [name], [name] 03/04/2021 10:30 A M Medical Record Number: [id number] Patient Account Number: [id number] Date of Birth/Sex: Treating RN: 09-Feb-1946 (75 y.o. Sue Lush Primary Care Ankith Edmonston: Cathlean Cower Other Clinician: Referring Nori Poland: Treating Deaunte Dente/Extender: Roque Cash in Treatment: 8 Visit Information History Since Last Visit Added or deleted any medications: No Patient Arrived: Wheel Chair Any new allergies or adverse reactions: No Arrival Time: 10:36 Had a fall or experienced change in No Accompanied By: Son activities of daily living that may affect Transfer Assistance: Manual risk of falls: Patient Identification Verified: Yes Signs or symptoms of abuse/neglect since last visito No Secondary Verification Process Completed: Yes Hospitalized since last visit: No Patient Requires Transmission-Based Precautions: No Implantable device outside of the clinic excluding No Patient Has Alerts: Yes cellular tissue based products placed in the center Patient Alerts: Patient on Blood Thinner since last visit: Has Dressing in Place as Prescribed: Yes Pain Present Now: No Electronic Signature(s) Signed: 03/04/2021 5:28:58 PM By: Lorrin Jackson Entered By: Lorrin Jackson on 03/04/2021 10:40:57 -------------------------------------------------------------------------------- Compression Therapy Details Patient Name: Date of Service: [name] 03/04/2021 10:30 A M Medical Record Number: [id number] Patient Account Number: [id number] Date of Birth/Sex: Treating RN: [birth year] (75 y.o. Sue Lush Primary Care Lakisa Lotz: Cathlean Cower Other Clinician: Referring Bryttney Netzer: Treating Amier Hoyt/Extender: Roque Cash in Treatment: 8 Compression Therapy Performed for Wound Assessment: Wound #1 Right,Posterior  Lower Leg Performed By: Clinician Lorrin Jackson, RN Compression Type: Three Layer Post Procedure Diagnosis Same as Pre-procedure Electronic Signature(s) Signed: 03/04/2021 5:28:58 PM By: Lorrin Jackson Entered By: Lorrin Jackson on 03/04/2021 11:52:46 -------------------------------------------------------------------------------- Encounter Discharge Information Details Patient Name: Date of Service: [name]. 03/04/2021 10:30 A M Medical Record Number: [id number] Patient Account Number: [id number] Date of Birth/Sex: Treating RN: 07/20/1945 (75 y.o. Sue Lush Primary Care Keiera Strathman: Cathlean Cower Other Clinician: Referring Khiya Friese: Treating Ladarrion Telfair/Extender: Roque Cash in Treatment: 8 Encounter Discharge Information Items Discharge Condition: Stable Ambulatory Status: Wheelchair Discharge Destination: Home Transportation: Private Auto Accompanied By: son Schedule Follow-up Appointment: Yes Clinical Summary of Care: Provided on 03/04/2021 Form Type Recipient Paper Patient Patient Electronic Signature(s) Signed: 03/04/2021 5:28:58 PM By: Lorrin Jackson Entered By: Lorrin Jackson on 03/04/2021 12:42:47 -------------------------------------------------------------------------------- Lower Extremity Assessment Details Patient Name: Date of Service: Melissa, Cameron 03/04/2021 10:30 A M Medical Record Number: [id number] Patient Account Number: [id number] Date of Birth/Sex: Treating RN: Jul 01, 1946 (75 y.o. Sue Lush Primary Care Dyllon Henken: Cathlean Cower Other Clinician: Referring Leightyn Cina: Treating Kathleene Bergemann/Extender: Roque Cash in Treatment: 8 Edema Assessment Assessed: [Left: No] [Right: Yes] Edema: [Left: Ye] [Right: s] Calf Left: Right: Point of Measurement: 34 cm From Medial Instep 36 cm Ankle Left: Right: Point of Measurement: 11 cm From Medial Instep 24 cm Vascular Assessment Pulses: Dorsalis  Pedis Palpable: [Right:Yes] Electronic Signature(s) Signed: 03/04/2021 5:28:58 PM By: Lorrin Jackson Entered By: Lorrin Jackson on 03/04/2021 10:52:14 -------------------------------------------------------------------------------- Multi-Disciplinary Care Plan Details Patient Name: Date of Service: [name]. 03/04/2021 10:30 A M Medical Record Number: [id number] Patient Account Number: [id number] Date of Birth/Sex: Treating RN: 1945/08/04 (75 y.o. Sue Lush Primary Care Trevino Wyatt: Cathlean Cower Other Clinician: Referring Shaterica Mcclatchy: Treating Shaylyn Bawa/Extender: Roque Cash in Treatment: 8 Multidisciplinary Care Plan reviewed with physician Active Inactive Electronic Signature(s) Signed: 04/13/2021 5:03:06 PM By: Lorrin Jackson Previous Signature: 03/04/2021 5:28:58 PM  Version By: Lorrin Jackson Entered By: Lorrin Jackson on 04/13/2021 17:03:05 -------------------------------------------------------------------------------- Pain Assessment Details Patient Name: Date of Service: Melissa, Cameron 03/04/2021 10:30 A M Medical Record Number: [id number] Patient Account Number: [id number] Date of Birth/Sex: Treating RN: [birth year] (75 y.o. Sue Lush Primary Care Cheyrl Buley: Cathlean Cower Other Clinician: Referring Laura Caldas: Treating Evola Hollis/Extender: Roque Cash in Treatment: 8 Active Problems Location of Pain Severity and Description of Pain Patient Has Paino No Site Locations Pain Management and Medication Current Pain Management: Electronic Signature(s) Signed: 03/04/2021 5:28:58 PM By: Lorrin Jackson Entered By: Lorrin Jackson on 03/04/2021 10:41:31 -------------------------------------------------------------------------------- Patient/Caregiver Education Details Patient Name: Date of Service: [name] 8/31/2022andnbsp10:30 [place] Record Number: [id number] Patient Account Number: [id number] Date of  Birth/Gender: Treating RN: [birth year] (75 y.o. Sue Lush Primary Care Physician: Cathlean Cower Other Clinician: Referring Physician: Treating Physician/Extender: Roque Cash in Treatment: 8 Education Assessment Education Provided To: Patient Education Topics Provided Pressure: Methods: Explain/Verbal, Printed Responses: State content correctly Wound/Skin Impairment: Methods: Explain/Verbal, Printed Responses: State content correctly Electronic Signature(s) Signed: 03/04/2021 5:28:58 PM By: Lorrin Jackson Entered By: Lorrin Jackson on 03/04/2021 10:36:18 -------------------------------------------------------------------------------- Wound Assessment Details Patient Name: Date of Service: [name]. 03/04/2021 10:30 A M Medical Record Number: [id number] Patient Account Number: [id number] Date of Birth/Sex: Treating RN: 12-04-1945 (75 y.o. Sue Lush Primary Care Niobe Dick: Cathlean Cower Other Clinician: Referring Zriyah Kopplin: Treating Davidmichael Zarazua/Extender: Roque Cash in Treatment: 8 Wound Status Wound Number: 1 Primary Pressure Ulcer Etiology: Wound Location: Right, Posterior Lower Leg Wound Healed - Epithelialized Wounding Event: Pressure Injury Status: Date Acquired: 06/23/2020 Comorbid Congestive Heart Failure, Coronary Artery Disease, Weeks Of Treatment: 8 History: Hypertension, Type II Diabetes, Paraplegia Clustered Wound: No Photos Wound Measurements Length: (cm) Width: (cm) Depth: (cm) Area: (cm) Volume: (cm) 0 % Reduction in Area: 100% 0 % Reduction in Volume: 100% 0 Epithelialization: None 0 Tunneling: No 0 Undermining: No Wound Description Classification: Category/Stage III Wound Margin: Distinct, outline attached Exudate Amount: Medium Exudate Type: Serosanguineous Exudate Color: red, brown Foul Odor After Cleansing: No Slough/Fibrino Yes Wound Bed Granulation Amount: Large (67-100%)  Exposed Structure Granulation Quality: Red, Pink Fascia Exposed: No Necrotic Amount: Small (1-33%) Fat Layer (Subcutaneous Tissue) Exposed: Yes Tendon Exposed: No Muscle Exposed: No Joint Exposed: No Bone Exposed: No Assessment Notes Called in that patient is healed 04/13/21 Treatment Notes Wound #1 (Lower Leg) Wound Laterality: Right, Posterior Cleanser Soap and Water Discharge Instruction: May shower and wash wound with dial antibacterial soap and water prior to dressing change. Wound Cleanser Discharge Instruction: Cleanse the wound with wound cleanser prior to applying a clean dressing using gauze sponges, not tissue or cotton balls. Peri-Wound Care Triamcinolone 15 (g) Discharge Instruction: Apply to leg under wrap Zinc Oxide Ointment 30g tube Discharge Instruction: T periwound o Sween Lotion (Moisturizing lotion) Discharge Instruction: Apply to leg under wrap Topical Primary Dressing Hydrofera Blue Ready Foam, 4x5 in Discharge Instruction: Apply to wound bed as instructed Secondary Dressing Woven Gauze Sponge, Non-Sterile 4x4 in Discharge Instruction: Apply over primary dressing as directed. ABD Pad, 5x9 Discharge Instruction: Apply over primary dressing as directed. Secured With Compression Wrap ThreePress (3 layer compression wrap) Discharge Instruction: Apply three layer compression as directed. Compression Stockings Add-Ons Electronic Signature(s) Signed: 04/13/2021 5:04:04 PM By: Lorrin Jackson Previous Signature: 03/04/2021 5:28:58 PM Version By: Lorrin Jackson Entered By: Lorrin Jackson on 04/13/2021 17:04:04 -------------------------------------------------------------------------------- Vitals Details Patient Name: Date of Service: [name].  03/04/2021 10:30 A M Medical Record Number: [id number] Patient Account Number: [id number] Date of Birth/Sex: Treating RN: 1946-01-24 (75 y.o. Sue Lush Primary Care Ahmet Schank: Cathlean Cower Other  Clinician: Referring Cinzia Devos: Treating Lenorris Karger/Extender: Roque Cash in Treatment: 8 Vital Signs Time Taken: 10:41 Temperature (F): 97.9 Pulse (bpm): 67 Respiratory Rate (breaths/min): 18 Blood Pressure (mmHg): 135/82 Reference Range: 80 - 120 mg / dl Electronic Signature(s) Signed: 03/04/2021 5:28:58 PM By: Lorrin Jackson Entered By: Lorrin Jackson on 03/04/2021 10:41:22

## 2021-03-05 ENCOUNTER — Other Ambulatory Visit: Payer: Self-pay | Admitting: Internal Medicine

## 2021-03-05 NOTE — Telephone Encounter (Signed)
Please refill as per office routine med refill policy (all routine meds to be refilled for 3 mo or monthly (per pt preference) up to one year from last visit, then month to month grace period for 3 mo, then further med refills will have to be denied) ? ?

## 2021-03-07 ENCOUNTER — Other Ambulatory Visit: Payer: Self-pay | Admitting: Internal Medicine

## 2021-03-07 NOTE — Telephone Encounter (Signed)
Please refill as per office routine med refill policy (all routine meds to be refilled for 3 mo or monthly (per pt preference) up to one year from last visit, then month to month grace period for 3 mo, then further med refills will have to be denied) ? ?

## 2021-03-11 ENCOUNTER — Encounter (HOSPITAL_BASED_OUTPATIENT_CLINIC_OR_DEPARTMENT_OTHER): Payer: Medicare Other | Attending: Physician Assistant | Admitting: Physician Assistant

## 2021-04-08 ENCOUNTER — Ambulatory Visit: Payer: Medicare Other | Admitting: Internal Medicine

## 2021-04-13 ENCOUNTER — Encounter: Payer: Self-pay | Admitting: Internal Medicine

## 2021-04-13 ENCOUNTER — Ambulatory Visit (INDEPENDENT_AMBULATORY_CARE_PROVIDER_SITE_OTHER): Payer: Medicare Other | Admitting: Internal Medicine

## 2021-04-13 ENCOUNTER — Other Ambulatory Visit: Payer: Self-pay

## 2021-04-13 VITALS — BP 120/78 | HR 96 | Temp 98.2°F | Ht 66.0 in

## 2021-04-13 DIAGNOSIS — E559 Vitamin D deficiency, unspecified: Secondary | ICD-10-CM

## 2021-04-13 DIAGNOSIS — I10 Essential (primary) hypertension: Secondary | ICD-10-CM | POA: Diagnosis not present

## 2021-04-13 DIAGNOSIS — E1159 Type 2 diabetes mellitus with other circulatory complications: Secondary | ICD-10-CM

## 2021-04-13 DIAGNOSIS — E7800 Pure hypercholesterolemia, unspecified: Secondary | ICD-10-CM

## 2021-04-13 DIAGNOSIS — E538 Deficiency of other specified B group vitamins: Secondary | ICD-10-CM

## 2021-04-13 DIAGNOSIS — Z23 Encounter for immunization: Secondary | ICD-10-CM

## 2021-04-13 LAB — CBC WITH DIFFERENTIAL/PLATELET
Basophils Absolute: 0 10*3/uL (ref 0.0–0.1)
Basophils Relative: 0.3 % (ref 0.0–3.0)
Eosinophils Absolute: 0.1 10*3/uL (ref 0.0–0.7)
Eosinophils Relative: 1.5 % (ref 0.0–5.0)
HCT: 43.5 % (ref 36.0–46.0)
Hemoglobin: 14 g/dL (ref 12.0–15.0)
Lymphocytes Relative: 36.1 % (ref 12.0–46.0)
Lymphs Abs: 3.3 10*3/uL (ref 0.7–4.0)
MCHC: 32.2 g/dL (ref 30.0–36.0)
MCV: 86.6 fl (ref 78.0–100.0)
Monocytes Absolute: 0.6 10*3/uL (ref 0.1–1.0)
Monocytes Relative: 6.8 % (ref 3.0–12.0)
Neutro Abs: 5.1 10*3/uL (ref 1.4–7.7)
Neutrophils Relative %: 55.3 % (ref 43.0–77.0)
Platelets: 235 10*3/uL (ref 150.0–400.0)
RBC: 5.02 Mil/uL (ref 3.87–5.11)
RDW: 13.5 % (ref 11.5–15.5)
WBC: 9.2 10*3/uL (ref 4.0–10.5)

## 2021-04-13 LAB — LIPID PANEL
Cholesterol: 124 mg/dL (ref 0–200)
HDL: 52.7 mg/dL (ref >39.00)
LDL Cholesterol: 47 mg/dL (ref 0–99)
NonHDL: 71.39
Total CHOL/HDL Ratio: 2
Triglycerides: 120 mg/dL (ref 0.0–149.0)
VLDL: 24 mg/dL (ref 0.0–40.0)

## 2021-04-13 LAB — HEPATIC FUNCTION PANEL
ALT: 16 U/L (ref 0–35)
AST: 20 U/L (ref 0–37)
Albumin: 4.1 g/dL (ref 3.5–5.2)
Alkaline Phosphatase: 78 U/L (ref 39–117)
Bilirubin, Direct: 0.1 mg/dL (ref 0.0–0.3)
Total Bilirubin: 0.3 mg/dL (ref 0.2–1.2)
Total Protein: 6.9 g/dL (ref 6.0–8.3)

## 2021-04-13 LAB — BASIC METABOLIC PANEL
BUN: 11 mg/dL (ref 6–23)
CO2: 27 mEq/L (ref 19–32)
Calcium: 9.6 mg/dL (ref 8.4–10.5)
Chloride: 106 mEq/L (ref 96–112)
Creatinine, Ser: 0.59 mg/dL (ref 0.40–1.20)
GFR: 87.39 mL/min (ref >60.00)
Glucose, Bld: 117 mg/dL — ABNORMAL HIGH (ref 70–99)
Potassium: 4.1 mEq/L (ref 3.5–5.1)
Sodium: 143 mEq/L (ref 135–145)

## 2021-04-13 LAB — TSH: TSH: 2.2 u[IU]/mL (ref 0.35–5.50)

## 2021-04-13 LAB — VITAMIN D 25 HYDROXY (VIT D DEFICIENCY, FRACTURES): VITD: 13.13 ng/mL — ABNORMAL LOW (ref 30.00–100.00)

## 2021-04-13 LAB — HEMOGLOBIN A1C: Hgb A1c MFr Bld: 6.5 % (ref 4.6–6.5)

## 2021-04-13 LAB — VITAMIN B12: Vitamin B-12: 152 pg/mL — ABNORMAL LOW (ref 211–911)

## 2021-04-13 NOTE — Patient Instructions (Signed)

## 2021-04-14 ENCOUNTER — Encounter: Payer: Self-pay | Admitting: Internal Medicine

## 2021-04-14 DIAGNOSIS — E559 Vitamin D deficiency, unspecified: Secondary | ICD-10-CM | POA: Insufficient documentation

## 2021-04-14 DIAGNOSIS — E538 Deficiency of other specified B group vitamins: Secondary | ICD-10-CM | POA: Insufficient documentation

## 2021-04-14 DIAGNOSIS — E785 Hyperlipidemia, unspecified: Secondary | ICD-10-CM | POA: Insufficient documentation

## 2021-04-14 NOTE — Assessment & Plan Note (Signed)
Lab Results  Component Value Date   LDLCALC 47 04/13/2021   Stable, pt to continue current statin lipitor

## 2021-04-14 NOTE — Assessment & Plan Note (Signed)
BP Readings from Last 3 Encounters:  04/13/21 120/78  06/19/20 120/72  04/10/20 134/80   Stable, pt to continue medical treatment losartan, toprol

## 2021-04-14 NOTE — Assessment & Plan Note (Signed)
Last vitamin D Lab Results  Component Value Date   VD25OH 13.13 (L) 04/13/2021   Low to start oral replacement

## 2021-04-14 NOTE — Progress Notes (Signed)
Chief Complaint: follow up HTN, HLD and hyperglycemia and needs FL2 filled out       HPI:  [name] is a 75 y.o. female here with son, overall doing ok, Pt denies chest pain, increased sob or doe, wheezing, orthopnea, PND, increased LE swelling, palpitations, dizziness or syncope.   Pt denies polydipsia, polyuria, or new focal neuro s/s.   Pt denies fever, wt loss, night sweats, loss of appetite, or other constitutional symptoms   Son going away for the next weekend, needs Fl2 filled out for respite care to [name], fax [phone]  No new complaints   Not taking B12 or D vitamins Wt Readings from Last 3 Encounters:  04/10/20 182 lb 15.7 oz (83 kg)  02/06/19 152 lb (68.9 kg)  08/18/18 154 lb 9.6 oz (70.1 kg)   BP Readings from Last 3 Encounters:  04/13/21 120/78  06/19/20 120/72  04/10/20 134/80         Past Medical History:  Diagnosis Date   Acute blood loss anemia    CHF (congestive heart failure) (HCC)    EF 91-63%, grade 1 diastolic dysfunction per echo 04/2015   Coronary artery disease involving native artery of 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 On insulin   Enchondroma of bone 2011   left femur.    Fracture, intertrochanteric, right femur ([place])    History of lower GI bleeding    Hyperlipidemia    Hypertension    Patent foramen ovale 04/2015   Started on warfarin 04/2015   Protein calorie malnutrition ([place])    Stercoral ulcer of rectum 05/16/2015   Past Surgical History:  Procedure Laterality Date   CARDIAC SURGERY     Cath without stent   COLONOSCOPY N/A 05/15/2015   Procedure: COLONOSCOPY;  Surgeon: Mauri Pole, MD;  Location: [place] [facility];  Service: Endoscopy;  Laterality: N/A;   FLEXIBLE SIGMOIDOSCOPY N/A 05/15/2015   Procedure: FLEXIBLE SIGMOIDOSCOPY;  Surgeon: Mauri Pole, MD;  Location: [facility];   Service: Endoscopy;  Laterality: N/A;  at bedside   INTRAMEDULLARY (IM) NAIL INTERTROCHANTERIC Right 06/12/2015   Procedure: INTRAMEDULLARY (IM) NAIL RIGHT HIP;  Surgeon: Renette Butters, MD;  Location: [facility];  Service: Orthopedics;  Laterality: Right;   TEE WITHOUT CARDIOVERSION N/A 05/05/2015   Procedure: TRANSESOPHAGEAL ECHOCARDIOGRAM (TEE);  Surgeon: Dixie Dials, MD;  [facility] END[facility]COPY;  Service: Cardiovascular;  Laterality: N/A;    reports that she quit smoking about 6 years ago. Her smoking use included cigarettes. She has never used smokeless tobacco. She reports that she does not drink alcohol and does not use drugs. family history includes Heart attack in her father; Heart failure in her mother; Hyperlipidemia in her mother; Hypertension in her mother. Allergies  Allergen Reactions   Pork-Derived Products Other (See Comments)    To keep blood pressure down   Current Outpatient Medications on File Prior to Visit  Medication Sig Dispense Refill   atorvastatin (LIPITOR) 40 MG tablet Take 0.5 tablets (20 mg total) by mouth daily.     ELIQUIS 5 MG TABS tablet Take 5 mg by mouth 2 (two) times daily.      Insulin Syringe-Needle U-100 (MM INSULIN SYRINGE/NEEDLE) 31G X 5/16" 1 ML MISC Use as directed four times per day E11.9 400 each 3   Lancets  MISC Use as directed four times per day E11.9 400 each 11   LEVEMIR 100 UNIT/ML injection INJECT 50 UNITS SUBCUTANEOUSLY ONCE DAILY (Patient taking differently: 20 units in the morning and 20 units at night) 10 mL 0   levETIRAcetam (KEPPRA) 500 MG tablet Take 1 tablet (500 mg total) by mouth 2 (two) times daily. 180 tablet 0   losartan (COZAAR) 25 MG tablet Take 12.5 mg by mouth daily.      metFORMIN (GLUCOPHAGE-XR) 750 MG 24 hr tablet Take 1 tablet by mouth once daily with breakfast 90 tablet 0   metoprolol succinate (TOPROL-XL) 25 MG 24 hr tablet Take 25 mg by mouth daily.      traMADol (ULTRAM) 50 MG tablet Take 1 tablet (50 mg total) by  mouth every 6 (six) hours as needed for moderate pain. 30 tablet 2   No current facility-administered medications on file prior to visit.        ROS:  All others reviewed and negative.  Objective        PE:  BP 120/78 (BP Location: Left Arm, Patient Position: Sitting, Cuff Size: Normal)   Pulse 96   Temp 98.2 F (36.8 C) (Oral)   Ht 5\' 6"  (1.676 m)   SpO2 98%   BMI 29.53 kg/m                 Constitutional: Pt appears in NAD               HENT: Head: NCAT.                Right Ear: External ear normal.                 Left Ear: External ear normal.                Eyes: . Pupils are equal, round, and reactive to light. Conjunctivae and EOM are normal               Nose: without d/c or deformity               Neck: Neck supple. Gross normal ROM               Cardiovascular: Normal rate and regular rhythm.                 Pulmonary/Chest: Effort normal and breath sounds without rales or wheezing.                Abd:  Soft, NT, ND, + BS, no organomegaly               Neurological: Pt is alert. At baseline orientation, motor with chronic right spastic hemiparesis               Skin: Skin is warm. No rashes, no other new lesions, LE edema - none               Psychiatric: Pt behavior is normal without agitation   Micro: none  Cardiac tracings I have personally interpreted today:  none  Pertinent Radiological findings (summarize): none   Lab Results  Component Value Date   WBC 9.2 04/13/2021   HGB 14.0 04/13/2021   HCT 43.5 04/13/2021   PLT 235.0 04/13/2021   GLUCOSE 117 (H) 04/13/2021   CHOL 124 04/13/2021   TRIG 120.0 04/13/2021   HDL 52.70 04/13/2021   LDLDIRECT 59.0 03/20/2020   LDLCALC 47 04/13/2021   ALT 16 04/13/2021  AST 20 04/13/2021   NA 143 04/13/2021   K 4.1 04/13/2021   CL 106 04/13/2021   CREATININE 0.59 04/13/2021   BUN 11 04/13/2021   CO2 27 04/13/2021   TSH 2.20 04/13/2021   INR 1.2 04/06/2020   HGBA1C 6.5 04/13/2021   MICROALBUR 2.3 (H)  11/04/2017   Assessment/Plan:  [name] is a 75 y.o. Black or African American [2] female with  has a past medical history of Acute blood loss anemia, CHF (congestive heart failure) ([place]), Coronary artery disease involving native artery of transplanted heart without angina pectoris, CVA (cerebral infarction) (04/2015), Depression, Diabetes ([place]) (2016), Enchondroma of bone (2011), Fracture, intertrochanteric, right femur ([place]), History of lower GI bleeding, Hyperlipidemia, Hypertension, Patent foramen ovale (04/2015), Protein calorie malnutrition ([place]), and Stercoral ulcer of rectum (05/16/2015).  Diabetes ([place]) Lab Results  Component Value Date   HGBA1C 6.5 04/13/2021   Stable, pt to continue current medical treatment  - levemir   Hypertension BP Readings from Last 3 Encounters:  04/13/21 120/78  06/19/20 120/72  04/10/20 134/80   Stable, pt to continue medical treatment losartan, toprol   HLD (hyperlipidemia) Lab Results  Component Value Date   LDLCALC 47 04/13/2021   Stable, pt to continue current statin lipitor   Vitamin D deficiency Last vitamin D Lab Results  Component Value Date   VD25OH 13.13 (L) 04/13/2021   Low to start oral replacement   B12 deficiency Lab Results  Component Value Date   VITAMINB12 152 (L) 04/13/2021   Low, to start oral replacement - b12 1000 mcg qd  Followup: No follow-ups on file.  Cathlean Cower, MD 04/14/2021 8:57 PM [facility]

## 2021-04-14 NOTE — Assessment & Plan Note (Signed)
Lab Results  Component Value Date   HGBA1C 6.5 04/13/2021   Stable, pt to continue current medical treatment  - levemir

## 2021-04-14 NOTE — Assessment & Plan Note (Signed)
Lab Results  Component Value Date   VITAMINB12 152 (L) 04/13/2021   Low, to start oral replacement - b12 1000 mcg qd

## 2021-04-24 ENCOUNTER — Telehealth: Payer: Self-pay | Admitting: Internal Medicine

## 2021-04-24 NOTE — Telephone Encounter (Signed)
Left message for patient to call me back at [phone] to schedule Medicare Annual Wellness Visit   Last AWV  08/18/18  Please schedule at anytime with [facility] if patient calls the office back.    40 Minutes appointment   Any questions, please call me at [phone]

## 2021-06-04 ENCOUNTER — Other Ambulatory Visit: Payer: Self-pay | Admitting: Neurology

## 2021-06-04 ENCOUNTER — Other Ambulatory Visit: Payer: Self-pay | Admitting: Internal Medicine

## 2021-06-04 NOTE — Telephone Encounter (Signed)
Please refill as per office routine med refill policy (all routine meds to be refilled for 3 mo or monthly (per pt preference) up to one year from last visit, then month to month grace period for 3 mo, then further med refills will have to be denied) ? ?

## 2021-06-08 ENCOUNTER — Other Ambulatory Visit: Payer: Self-pay | Admitting: Neurology

## 2021-06-11 ENCOUNTER — Encounter: Payer: Self-pay | Admitting: Internal Medicine

## 2021-06-11 ENCOUNTER — Other Ambulatory Visit: Payer: Self-pay | Admitting: Neurology

## 2021-06-12 MED ORDER — LEVETIRACETAM 500 MG PO TABS: 500.0000 mg | ORAL_TABLET | Freq: Two times a day (BID) | ORAL | 3 refills | Status: DC

## 2021-06-26 ENCOUNTER — Other Ambulatory Visit: Payer: Self-pay | Admitting: Internal Medicine

## 2021-06-26 NOTE — Telephone Encounter (Signed)
Please refill as per office routine med refill policy (all routine meds to be refilled for 3 mo or monthly (per pt preference) up to one year from last visit, then month to month grace period for 3 mo, then further med refills will have to be denied) ? ?

## 2021-07-08 ENCOUNTER — Encounter: Payer: Self-pay | Admitting: Internal Medicine

## 2021-07-21 ENCOUNTER — Ambulatory Visit: Payer: Medicare PPO | Admitting: Internal Medicine

## 2021-07-27 ENCOUNTER — Ambulatory Visit (INDEPENDENT_AMBULATORY_CARE_PROVIDER_SITE_OTHER): Payer: Medicare PPO | Admitting: Internal Medicine

## 2021-07-27 ENCOUNTER — Encounter: Payer: Self-pay | Admitting: Internal Medicine

## 2021-07-27 ENCOUNTER — Other Ambulatory Visit: Payer: Self-pay

## 2021-07-27 VITALS — BP 120/74 | HR 113 | Temp 98.6°F | Ht 66.0 in | Wt 154.0 lb

## 2021-07-27 DIAGNOSIS — E7800 Pure hypercholesterolemia, unspecified: Secondary | ICD-10-CM

## 2021-07-27 DIAGNOSIS — K5900 Constipation, unspecified: Secondary | ICD-10-CM | POA: Diagnosis not present

## 2021-07-27 DIAGNOSIS — I509 Heart failure, unspecified: Secondary | ICD-10-CM | POA: Diagnosis not present

## 2021-07-27 DIAGNOSIS — E559 Vitamin D deficiency, unspecified: Secondary | ICD-10-CM | POA: Diagnosis not present

## 2021-07-27 DIAGNOSIS — Z0001 Encounter for general adult medical examination with abnormal findings: Secondary | ICD-10-CM

## 2021-07-27 DIAGNOSIS — I10 Essential (primary) hypertension: Secondary | ICD-10-CM

## 2021-07-27 DIAGNOSIS — E1159 Type 2 diabetes mellitus with other circulatory complications: Secondary | ICD-10-CM

## 2021-07-27 DIAGNOSIS — E538 Deficiency of other specified B group vitamins: Secondary | ICD-10-CM | POA: Diagnosis not present

## 2021-07-27 LAB — HEPATIC FUNCTION PANEL
ALT: 18 U/L (ref 0–35)
AST: 22 U/L (ref 0–37)
Albumin: 4.1 g/dL (ref 3.5–5.2)
Alkaline Phosphatase: 86 U/L (ref 39–117)
Bilirubin, Direct: 0.1 mg/dL (ref 0.0–0.3)
Total Bilirubin: 0.4 mg/dL (ref 0.2–1.2)
Total Protein: 6.9 g/dL (ref 6.0–8.3)

## 2021-07-27 LAB — VITAMIN D 25 HYDROXY (VIT D DEFICIENCY, FRACTURES): VITD: 9.96 ng/mL — ABNORMAL LOW (ref 30.00–100.00)

## 2021-07-27 LAB — CBC WITH DIFFERENTIAL/PLATELET
Basophils Absolute: 0 10*3/uL (ref 0.0–0.1)
Basophils Relative: 0.3 % (ref 0.0–3.0)
Eosinophils Absolute: 0.1 10*3/uL (ref 0.0–0.7)
Eosinophils Relative: 1.1 % (ref 0.0–5.0)
HCT: 44.6 % (ref 36.0–46.0)
Hemoglobin: 14 g/dL (ref 12.0–15.0)
Lymphocytes Relative: 16.4 % (ref 12.0–46.0)
Lymphs Abs: 1.8 10*3/uL (ref 0.7–4.0)
MCHC: 31.5 g/dL (ref 30.0–36.0)
MCV: 86.7 fl (ref 78.0–100.0)
Monocytes Absolute: 0.6 10*3/uL (ref 0.1–1.0)
Monocytes Relative: 5.6 % (ref 3.0–12.0)
Neutro Abs: 8.5 10*3/uL — ABNORMAL HIGH (ref 1.4–7.7)
Neutrophils Relative %: 76.6 % (ref 43.0–77.0)
Platelets: 247 10*3/uL (ref 150.0–400.0)
RBC: 5.14 Mil/uL — ABNORMAL HIGH (ref 3.87–5.11)
RDW: 13.3 % (ref 11.5–15.5)
WBC: 11.1 10*3/uL — ABNORMAL HIGH (ref 4.0–10.5)

## 2021-07-27 LAB — VITAMIN B12: Vitamin B-12: 176 pg/mL — ABNORMAL LOW (ref 211–911)

## 2021-07-27 LAB — LIPID PANEL
Cholesterol: 124 mg/dL (ref 0–200)
HDL: 48.8 mg/dL (ref >39.00)
LDL Cholesterol: 48 mg/dL (ref 0–99)
NonHDL: 74.7
Total CHOL/HDL Ratio: 3
Triglycerides: 135 mg/dL (ref 0.0–149.0)
VLDL: 27 mg/dL (ref 0.0–40.0)

## 2021-07-27 LAB — BASIC METABOLIC PANEL
BUN: 13 mg/dL (ref 6–23)
CO2: 22 mEq/L (ref 19–32)
Calcium: 9.7 mg/dL (ref 8.4–10.5)
Chloride: 106 mEq/L (ref 96–112)
Creatinine, Ser: 0.75 mg/dL (ref 0.40–1.20)
GFR: 77.05 mL/min (ref >60.00)
Glucose, Bld: 168 mg/dL — ABNORMAL HIGH (ref 70–99)
Potassium: 4.3 mEq/L (ref 3.5–5.1)
Sodium: 142 mEq/L (ref 135–145)

## 2021-07-27 LAB — HEMOGLOBIN A1C: Hgb A1c MFr Bld: 7.2 % — ABNORMAL HIGH (ref 4.6–6.5)

## 2021-07-27 LAB — TSH: TSH: 3.06 u[IU]/mL (ref 0.35–5.50)

## 2021-07-27 LAB — BRAIN NATRIURETIC PEPTIDE: Pro B Natriuretic peptide (BNP): 26 pg/mL (ref 0.0–100.0)

## 2021-07-27 NOTE — Patient Instructions (Addendum)
We will need to work on the update FL2 form  Please take OTC miralax daily for constipation  Please remember to make you yearly appointments for eye and foot doctors  Please continue all other medications as before, and refills have been done if requested.  Please have the pharmacy call with any other refills you may need.  Please continue your efforts at being more active, low cholesterol diet, and weight control.  You are otherwise up to date with prevention measures today.  Please keep your appointments with your specialists as you may have planned  Please go to the LAB at the blood drawing area for the tests to be done  You will be contacted by phone if any changes need to be made immediately.  Otherwise, you will receive a letter about your results with an explanation, but please check with MyChart first.  Please remember to sign up for MyChart if you have not done so, as this will be important to you in the future with finding out test results, communicating by private email, and scheduling acute appointments online when needed.  Please make an Appointment to return in 6 months, or sooner if needed

## 2021-07-27 NOTE — Progress Notes (Signed)
Patient ID: [name], female   DOB: 1946-04-27, 76 y.o.   MRN: [id number]         Chief Complaint:: wellness exam and constipation, chf, low b12, dm, hld, htn, low vit D       HPI:  [name] is a 76 y.o. female here for wellness exam; son with her is her primary caretaker and will call for eye and podiatry appointments;declines covid booster, shingrix, dxa o/w up to date.  Pt states specifically she is unable to provide urine sample at this time  Son asks for update FL2 form for respite care when needed  Currently bed bound, has to have hoyer to move out of bed.        Also, denies worsening reflux, dysphagia, n/v, bowel change or blood, except for recurring constipation several days per wk for many months.   Has gotten away from miralax though this has worked well in past.  Pt denies chest pain, increased sob or doe, wheezing, orthopnea, PND, increased LE swelling, palpitations, dizziness or syncope.   Pt denies polydipsia, polyuria, or low sugar symptoms.   Pt denies fever, wt loss, night sweats, loss of appetite, or other constitutional symptoms   Wt Readings from Last 3 Encounters:  07/27/21 154 lb (69.9 kg)  04/10/20 182 lb 15.7 oz (83 kg)  02/06/19 152 lb (68.9 kg)   BP Readings from Last 3 Encounters:  07/27/21 120/74  04/13/21 120/78  06/19/20 120/72   Immunization History  Administered Date(s) Administered   Fluad Quad(high Dose 65+) 04/13/2021   Influenza, High Dose Seasonal PF 04/20/2017, 08/18/2018   PFIZER(Purple Top)SARS-COV-2 Vaccination 08/11/2019, 09/01/2019   PPD Test 06/30/2015   Pneumococcal Conjugate-13 10/26/2017   Pneumococcal Polysaccharide-23 01/30/2020   Health Maintenance Due  Topic Date Due   OPHTHALMOLOGY EXAM  12/06/2018      Past Medical History:  Diagnosis Date   Acute blood loss anemia    CHF (congestive heart failure) (HCC)    EF 63-78%, grade 1 diastolic dysfunction per echo 04/2015   Coronary artery disease involving native artery  of transplanted heart without angina pectoris    CVA (cerebral infarction) 04/2015   Started Plavix 04/2015   Depression    Diabetes ([place]) 2016   Type II. On insulin   Enchondroma of bone 2011   left femur.    Fracture, intertrochanteric, right femur ([place])    History of lower GI bleeding    Hyperlipidemia    Hypertension    Patent foramen ovale 04/2015   Started on warfarin 04/2015   Protein calorie malnutrition ([place])    Stercoral ulcer of rectum 05/16/2015   Past Surgical History:  Procedure Laterality Date   CARDIAC SURGERY     Cath without stent   COLONOSCOPY N/A 05/15/2015   Procedure: COLONOSCOPY;  Surgeon: Mauri Pole, MD;  Location: [facility];  Service: Endoscopy;  Laterality: N/A;   FLEXIBLE SIGMOIDOSCOPY N/A 05/15/2015   Procedure: FLEXIBLE SIGMOIDOSCOPY;  Surgeon: Mauri Pole, MD;  Location: [facility];  Service: Endoscopy;  Laterality: N/A;  at bedside   INTRAMEDULLARY (IM) NAIL INTERTROCHANTERIC Right 06/12/2015   Procedure: INTRAMEDULLARY (IM) NAIL RIGHT HIP;  Surgeon: Renette Butters, MD;  Location: [facility];  Service: Orthopedics;  Laterality: Right;   TEE WITHOUT CARDIOVERSION N/A 05/05/2015   Procedure: TRANSESOPHAGEAL ECHOCARDIOGRAM (TEE);  Surgeon: Dixie Dials, MD;  Location: [facility] END[facility]COPY;  Service: Cardiovascular;  Laterality: N/A;    reports that she quit smoking about 6 years  ago. Her smoking use included cigarettes. She has never used smokeless tobacco. She reports that she does not drink alcohol and does not use drugs. family history includes Heart attack in her father; Heart failure in her mother; Hyperlipidemia in her mother; Hypertension in her mother. Allergies  Allergen Reactions   Pork-Derived Products Other (See Comments)    To keep blood pressure down   Current Outpatient Medications on File Prior to Visit  Medication Sig Dispense Refill   atorvastatin (LIPITOR) 40 MG tablet Take 0.5 tablets (20 mg total) by mouth daily.      ELIQUIS 5 MG TABS tablet Take 5 mg by mouth 2 (two) times daily.      insulin detemir (LEVEMIR) 100 UNIT/ML injection INJECT 50 UNITS SUBCUTANEOUSLY ONCE DAILY 10 mL 3   Insulin Syringe-Needle U-100 (MM INSULIN SYRINGE/NEEDLE) 31G X 5/16" 1 ML MISC Use as directed four times per day E11.9 400 each 3   Lancets MISC Use as directed four times per day E11.9 400 each 11   levETIRAcetam (KEPPRA) 500 MG tablet Take 1 tablet (500 mg total) by mouth 2 (two) times daily. 180 tablet 3   losartan (COZAAR) 25 MG tablet Take 12.5 mg by mouth daily.      metFORMIN (GLUCOPHAGE-XR) 750 MG 24 hr tablet Take 1 tablet by mouth once daily with breakfast 90 tablet 1   metoprolol succinate (TOPROL-XL) 25 MG 24 hr tablet Take 25 mg by mouth daily.      traMADol (ULTRAM) 50 MG tablet Take 1 tablet (50 mg total) by mouth every 6 (six) hours as needed for moderate pain. 30 tablet 2   No current facility-administered medications on file prior to visit.        ROS:  All others reviewed and negative.  Objective        PE:  BP 120/74 (BP Location: Left Arm, Patient Position: Sitting, Cuff Size: Normal)    Pulse (!) 113    Temp 98.6 F (37 C) (Oral)    Ht 5\' 6"  (1.676 m)    Wt 154 lb (69.9 kg)    SpO2 96%    BMI 24.86 kg/m                 Constitutional: Pt appears in NAD               HENT: Head: NCAT.                Right Ear: External ear normal.                 Left Ear: External ear normal.                Eyes: . Pupils are equal, round, and reactive to light. Conjunctivae and EOM are normal               Nose: without d/c or deformity               Neck: Neck supple. Gross normal ROM               Cardiovascular: Normal rate and regular rhythm.                 Pulmonary/Chest: Effort normal and breath sounds without rales or wheezing.                Abd:  Soft, NT, ND, + BS, no organomegaly  Neurological: Pt is alert. At baseline orientation, motor with right hemiparesis               Skin: Skin  is warm. No rashes, no other new lesions, LE edema - none               Psychiatric: Pt behavior is normal without agitation   Micro: none  Cardiac tracings I have personally interpreted today:  none  Pertinent Radiological findings (summarize): none   Lab Results  Component Value Date   WBC 11.1 (H) 07/27/2021   HGB 14.0 07/27/2021   HCT 44.6 07/27/2021   PLT 247.0 07/27/2021   GLUCOSE 168 (H) 07/27/2021   CHOL 124 07/27/2021   TRIG 135.0 07/27/2021   HDL 48.80 07/27/2021   LDLDIRECT 59.0 03/20/2020   LDLCALC 48 07/27/2021   ALT 18 07/27/2021   AST 22 07/27/2021   NA 142 07/27/2021   K 4.3 07/27/2021   CL 106 07/27/2021   CREATININE 0.75 07/27/2021   BUN 13 07/27/2021   CO2 22 07/27/2021   TSH 3.06 07/27/2021   INR 1.2 04/06/2020   HGBA1C 7.2 (H) 07/27/2021   MICROALBUR 2.3 (H) 11/04/2017   Assessment/Plan:  [name] is a 76 y.o. Black or African American [2] female with  has a past medical history of Acute blood loss anemia, CHF (congestive heart failure) ([place]), Coronary artery disease involving native artery of transplanted heart without angina pectoris, CVA (cerebral infarction) (04/2015), Depression, Diabetes ([place]) (2016), Enchondroma of bone (2011), Fracture, intertrochanteric, right femur ([place]), History of lower GI bleeding, Hyperlipidemia, Hypertension, Patent foramen ovale (04/2015), Protein calorie malnutrition ([place]), and Stercoral ulcer of rectum (05/16/2015).  Vitamin D deficiency Last vitamin D Lab Results  Component Value Date   VD25OH 13.13 (L) 04/13/2021   Low, to start oral replacement   B12 deficiency Lab Results  Component Value Date   VITAMINB12 176 (L) 07/27/2021   Low, to start oral replacement - b12 1000 mcg qd   Diabetes ([place]) Lab Results  Component Value Date   HGBA1C 7.2 (H) 07/27/2021   Mild uncontrolled, pt to continue current medical treatment levemir, metformin as declines change   HLD (hyperlipidemia) Lab Results   Component Value Date   LDLCALC 48 07/27/2021   Stable, pt to continue current statin lipitor 40   Hypertension BP Readings from Last 3 Encounters:  07/27/21 120/74  04/13/21 120/78  06/19/20 120/72   Stable, pt to continue medical treatment losartan, toprol xl   Constipation Mild chronic recurrent - for increased miralax to daily  CHF (congestive heart failure) (HCC) Stable volume, does not require diuretic at this time  Encounter for well adult exam with abnormal findings Age and sex appropriate education and counseling updated with regular exercise and diet Referrals for preventative services - for eye and podiatry appointments soon, declines dxa Immunizations addressed - declines covid booster, shingrx Smoking counseling  - none needed Evidence for depression or other mood disorder - none significant Most recent labs reviewed. I have personally reviewed and have noted: 1) the patient's medical and social history 2) The patient's current medications and supplements 3) The patient's height, weight, and BMI have been recorded in the chart  Followup: Return in about 6 months (around 01/24/2022).  Cathlean Cower, MD 08/02/2021 3:16 PM [facility]

## 2021-07-27 NOTE — Assessment & Plan Note (Signed)
Last vitamin D Lab Results  Component Value Date   VD25OH 13.13 (L) 04/13/2021   Low, to start oral replacement

## 2021-07-28 ENCOUNTER — Encounter: Payer: Self-pay | Admitting: Internal Medicine

## 2021-08-02 ENCOUNTER — Encounter: Payer: Self-pay | Admitting: Internal Medicine

## 2021-08-02 NOTE — Assessment & Plan Note (Signed)
Age and sex appropriate education and counseling updated with regular exercise and diet Referrals for preventative services - for eye and podiatry appointments soon, declines dxa Immunizations addressed - declines covid booster, shingrx Smoking counseling  - none needed Evidence for depression or other mood disorder - none significant Most recent labs reviewed. I have personally reviewed and have noted: 1) the patient's medical and social history 2) The patient's current medications and supplements 3) The patient's height, weight, and BMI have been recorded in the chart

## 2021-08-02 NOTE — Assessment & Plan Note (Signed)
Lab Results  Component Value Date   HGBA1C 7.2 (H) 07/27/2021   Mild uncontrolled, pt to continue current medical treatment levemir, metformin as declines change

## 2021-08-02 NOTE — Assessment & Plan Note (Signed)
Stable volume, does not require diuretic at this time

## 2021-08-02 NOTE — Assessment & Plan Note (Signed)
Lab Results  Component Value Date   VITAMINB12 176 (L) 07/27/2021   Low, to start oral replacement - b12 1000 mcg qd

## 2021-08-02 NOTE — Assessment & Plan Note (Signed)
Lab Results  Component Value Date   LDLCALC 48 07/27/2021   Stable, pt to continue current statin lipitor 40

## 2021-08-02 NOTE — Assessment & Plan Note (Signed)
Mild chronic recurrent - for increased miralax to daily

## 2021-08-02 NOTE — Assessment & Plan Note (Signed)
BP Readings from Last 3 Encounters:  07/27/21 120/74  04/13/21 120/78  06/19/20 120/72   Stable, pt to continue medical treatment losartan, toprol xl

## 2021-08-04 ENCOUNTER — Encounter: Payer: Self-pay | Admitting: Internal Medicine

## 2021-08-04 NOTE — Telephone Encounter (Signed)
Yes, I think this would be a med records request    thanks

## 2021-08-07 ENCOUNTER — Encounter: Payer: Self-pay | Admitting: Internal Medicine

## 2021-08-10 NOTE — Telephone Encounter (Signed)
Pt's son [name] calling to check status of mychart request  Caller states diet restriction needs to be updated as well as the other request listed    Caller requesting a call when forms are ready for pick up  *see below*   Phone -[phone] [name]

## 2021-08-12 ENCOUNTER — Encounter (HOSPITAL_BASED_OUTPATIENT_CLINIC_OR_DEPARTMENT_OTHER): Payer: Medicare PPO | Attending: Physician Assistant | Admitting: Physician Assistant

## 2021-08-12 ENCOUNTER — Other Ambulatory Visit: Payer: Self-pay

## 2021-08-12 DIAGNOSIS — I2510 Atherosclerotic heart disease of native coronary artery without angina pectoris: Secondary | ICD-10-CM | POA: Insufficient documentation

## 2021-08-12 DIAGNOSIS — Z09 Encounter for follow-up examination after completed treatment for conditions other than malignant neoplasm: Secondary | ICD-10-CM | POA: Diagnosis not present

## 2021-08-12 DIAGNOSIS — L89893 Pressure ulcer of other site, stage 3: Secondary | ICD-10-CM | POA: Diagnosis present

## 2021-08-12 DIAGNOSIS — Z7901 Long term (current) use of anticoagulants: Secondary | ICD-10-CM | POA: Diagnosis not present

## 2021-08-12 DIAGNOSIS — I110 Hypertensive heart disease with heart failure: Secondary | ICD-10-CM | POA: Diagnosis not present

## 2021-08-12 DIAGNOSIS — I69351 Hemiplegia and hemiparesis following cerebral infarction affecting right dominant side: Secondary | ICD-10-CM | POA: Insufficient documentation

## 2021-08-12 DIAGNOSIS — I5042 Chronic combined systolic (congestive) and diastolic (congestive) heart failure: Secondary | ICD-10-CM | POA: Insufficient documentation

## 2021-08-12 DIAGNOSIS — I890 Lymphedema, not elsewhere classified: Secondary | ICD-10-CM | POA: Diagnosis not present

## 2021-08-12 NOTE — Telephone Encounter (Signed)
FL2 form corrected and patient's son will pick up copy this evening

## 2021-08-12 NOTE — Progress Notes (Addendum)
[name] ([id number]) , Visit Report for 08/12/2021 Allergy List Details Patient Name: Date of Service: [name], [name] 08/12/2021 9:00 [place] Record Number: [id number] Patient Account Number: [id number] Date of Birth/Sex: Treating RN: [name] (76 y.o. [name] Primary Care [name]: Cathlean Cower Other Clinician: Referring [name]: Treating [name]/Extender: [name]: 0 Allergies Active Allergies pork-derived products Type: Food Allergy Notes Electronic Signature(s) Signed: 08/13/2021 8:04:36 AM By: Melissa Creamer RN, BSN Entered By: [name] on 08/12/2021 09:41:09 -------------------------------------------------------------------------------- Arrival Information Details Patient Name: Date of Service: [name] 08/12/2021 9:00 A M Medical Record Number: [id number] Patient Account Number: [id number] Date of Birth/Sex: Treating RN: [name] (76 y.o. [name] Primary Care [name]: Cathlean Cower Other Clinician: Referring [name]: Treating [name]/Extender: [name]: 0 Visit Information Patient Arrived: Wheel Chair Arrival Time: 09:29 Accompanied By: Pandora Leiter Transfer Assistance: Manual Patient Identification Verified: Yes Secondary Verification Process Completed: Yes Patient Has Alerts: Yes Patient Alerts: Patient on Blood Thinner ABI: R 0.94 01/07/21 History Since Last Visit Added or deleted any medications: No Any new allergies or adverse reactions: No Had a fall or experienced change in activities of daily living that may affect risk of falls: No Signs or symptoms of abuse/neglect since last visito No Hospitalized since last visit: No Electronic Signature(s) Signed: 08/13/2021 8:04:36 AM By: Melissa Creamer RN, BSN Entered By: [name] on 08/12/2021 09:44:07 -------------------------------------------------------------------------------- Clinic Level of  Care Assessment Details Patient Name: Date of Service: [name] 08/12/2021 9:00 [name] Record Number: [id number] Patient Account Number: [id number] Date of Birth/Sex: Treating RN: [birth year]Cameron (76 y.o. [name] Primary Care [name]: Cathlean Cower Other Clinician: Referring [name]: Treating Yaxiel Minnie/Extender: [name]: 0 Clinic Level of Care Assessment Items TOOL 1 Quantity Score X- 1 0 Use when EandM and Procedure is performed on INITIAL visit ASSESSMENTS - Nursing Assessment / Reassessment X- 1 20 General Physical Exam (combine w/ comprehensive assessment (listed just below) when performed on new pt. evals) X- 1 25 Comprehensive Assessment (HX, ROS, Risk Assessments, Wounds Hx, etc.) ASSESSMENTS - Wound and Skin Assessment / Reassessment X- 1 10 Dermatologic / Skin Assessment (not related to wound area) ASSESSMENTS - Ostomy and/or Continence Assessment and Care []  - 0 Incontinence Assessment and Management []  - 0 Ostomy Care Assessment and Management (repouching, etc.) PROCESS - Coordination of Care X - Simple Patient / Family Education for ongoing care 1 15 []  - 0 Complex (extensive) Patient / Family Education for ongoing care X- 1 10 Staff obtains Programmer, systems, Records, T Results / Process Orders est []  - 0 Staff telephones HHA, Nursing Homes / Clarify orders / etc []  - 0 Routine Transfer to another Facility (non-emergent condition) []  - 0 Routine Hospital Admission (non-emergent condition) X- 1 15 New Admissions / Biomedical engineer / Ordering NPWT Apligraf, etc. , []  - 0 Emergency Hospital Admission (emergent condition) PROCESS - Special Needs []  - 0 Pediatric / Minor Patient Management []  - 0 Isolation Patient Management []  - 0 Hearing / Language / Visual special needs []  - 0 Assessment of Community assistance (transportation, D/C planning, etc.) []  - 0 Additional assistance / Altered mentation []  -  0 Support Surface(s) Assessment (bed, cushion, seat, etc.) INTERVENTIONS - Miscellaneous []  - 0 External ear exam []  - 0 Patient Transfer (multiple staff / Civil Service fast streamer / Similar devices) []  - 0 Simple Staple / Suture removal (25 or less) []  -  0 Complex Staple / Suture removal (26 or more) []  - 0 Hypo/Hyperglycemic Management (do not check if billed separately) []  - 0 Ankle / Brachial Index (ABI) - do not check if billed separately Has the patient been seen at the hospital within the last three years: Yes Total Score: 95 Level Of Care: New/Established - Level 3 Electronic Signature(s) Signed: 08/12/2021 5:26:44 PM By: Deon Pilling RN, BSN Entered By: Deon Pilling on 08/12/2021 10:23:42 -------------------------------------------------------------------------------- Compression Therapy Details Patient Name: Date of Service: [name] 08/12/2021 9:00 [place] Record Number: [id number] Patient Account Number: [id number] Date of Birth/Sex: Treating RN: [birth year] (76 y.o. [name] Primary Care Ersel Enslin: Cathlean Cower Other Clinician: Referring [name]: Treating [name]/Extender: [name]: 0 Compression Therapy Performed for Wound Assessment: Wound #2 Right,Posterior Lower Leg Performed By: Clinician Deon Pilling, RN Compression Type: Three Layer Post Procedure Diagnosis Same as Pre-procedure Electronic Signature(s) Signed: 08/12/2021 5:26:44 PM By: Deon Pilling RN, BSN Entered By: Deon Pilling on 08/12/2021 10:23:27 -------------------------------------------------------------------------------- Encounter Discharge Information Details Patient Name: Date of Service: [name]. 08/12/2021 9:00 [place] Record Number: [id number] Patient Account Number: [id number] Date of Birth/Sex: Treating RN: [birth year] (76 y.o. [name], [name] Primary Care Hayla Hinger: Cathlean Cower Other Clinician: Referring [name]: Treating  [name]/Extender: [name]: 0 Encounter Discharge Information Items Post Procedure Vitals Discharge Condition: Stable Temperature (F): 98.3 Ambulatory Status: Wheelchair Pulse (bpm): 91 Discharge Destination: Home Respiratory Rate (breaths/min): 18 Transportation: Private Auto Blood Pressure (mmHg): 127/85 Accompanied By: son Schedule Follow-up Appointment: Yes Clinical Summary of Care: Electronic Signature(s) Signed: 08/12/2021 5:26:44 PM By: Deon Pilling RN, BSN Entered By: Deon Pilling on 08/12/2021 10:24:54 -------------------------------------------------------------------------------- Lower Extremity Assessment Details Patient Name: Date of Service: [name]. 08/12/2021 9:00 [place] Record Number: [id number] Patient Account Number: [id number] Date of Birth/Sex: Treating RN: [birth year]ameron (76 y.o. [name] Primary Care Bionca Mckey: Cathlean Cower Other Clinician: Referring Zaiyah Sottile: Treating Adrick Kestler/Extender: [name]: 0 Edema Assessment Assessed: [Left: No] [Right: No] E[Left: dema] [Right: :] Calf Left: Right: Point of Measurement: From Medial Instep 36.5 cm Ankle Left: Right: Point of Measurement: From Medial Instep 24.8 cm Vascular Assessment Pulses: Dorsalis Pedis Palpable: [Right:Yes] Electronic Signature(s) Signed: 08/13/2021 8:04:36 AM By: Melissa Creamer RN, BSN Entered By: [name] on 08/12/2021 09:57:17 -------------------------------------------------------------------------------- [place] Details Patient Name: Date of Service: [name]. 08/12/2021 9:00 A M Medical Record Number: [id number] Patient Account Number: [id number] Date of Birth/Sex: Treating RN: [birth year] (76 y.o. Sue Lush Primary Care Brownie Nehme: Cathlean Cower Other Clinician: Referring Bethany Cumming: Treating Rem[place]ton Teters/Extender: [name] in  Cameron: 0 Active Inactive Wound/Skin Impairment Nursing Diagnoses: Impaired tissue integrity Goals: Patient/caregiver will verbalize understanding of skin care regimen Date Initiated: 08/12/2021 Target Resolution Date: 09/09/2021 Goal Status: Active Ulcer/skin breakdown will have a volume reduction of 30% by week 4 Date Initiated: 08/12/2021 Target Resolution Date: 09/09/2021 Goal Status: Active Interventions: Assess patient/caregiver ability to obtain necessary supplies Assess patient/caregiver ability to perform ulcer/skin care regimen upon admission and as needed Assess ulceration(s) every visit Provide education on ulcer and skin care Cameron Activities: Topical wound management initiated : 08/12/2021 Notes: Electronic Signature(s) Signed: 08/12/2021 9:27:00 AM By: Lorrin Jackson Entered By: Lorrin Jackson on 08/12/2021 09:27:00 -------------------------------------------------------------------------------- Pain Assessment Details Patient Name: Date of Service: [name] 08/12/2021 9:00 [place] Record Number: [id number] Patient Account Number: [id number] Date of  Birth/Sex: Treating RN: [birth year] Cameron (75 y.o. [name] Primary Care Osker Ayoub: Cathlean Cower Other Clinician: Referring Cyleigh Massaro: Treating Hamad Whyte/Extender: [name]: 0 Active Problems Location of Pain Severity and Description of Pain Patient Has Paino No Site Locations Pain Management and Medication Current Pain Management: Electronic Signature(s) Signed: 08/13/2021 8:04:36 AM By: Melissa Creamer RN, BSN Entered By: [name] on 08/12/2021 09:57:31 -------------------------------------------------------------------------------- Patient/Caregiver Education Details Patient Name: Date of Service: [place], Trannie S. 2/8/2023andnbsp9:00 A M Medical Record Number: [id number] Patient Account Number: [id number] Date of Birth/Gender: Treating RN: [birth year] Cameron (76  y.o. [name] Primary Care Physician: Cathlean Cower Other Clinician: Referring Physician: Treating Physician/Extender: [name]: 0 Education Assessment Education Provided To: Patient and Caregiver son Education Topics Provided Wound/Skin Impairment: Handouts: Skin Care Do's and Dont's Methods: Explain/Verbal Responses: Reinforcements needed Electronic Signature(s) Signed: 08/12/2021 5:26:44 PM By: Deon Pilling RN, BSN Entered By: Deon Pilling on 08/12/2021 10:12:32 -------------------------------------------------------------------------------- Wound Assessment Details Patient Name: Date of Service: [name]. 08/12/2021 9:00 [place] Record Number: [id number] Patient Account Number: [id number] Date of Birth/Sex: Treating RN: [birth year] Cameron (76 y.o. [name] Primary Care Zacchary Pompei: Cathlean Cower Other Clinician: Referring Quiana Cobaugh: Treating Stepahnie Campo/Extender: [name]: 0 Wound Status Wound Number: 2 Primary Pressure Ulcer Etiology: Wound Location: Right, Posterior Lower Leg Wound Open Wounding Event: Gradually Appeared Status: Date Acquired: 07/29/2021 Comorbid Lymphedema, Congestive Heart Failure, Coronary Artery Weeks Of Cameron: 0 History: Disease, Hypertension, Type II Diabetes, Paraplegia Clustered Wound: No Pending Amputation On Presentation Photos Wound Measurements Length: (cm) 1 Width: (cm) 1 Depth: (cm) 0.1 Area: (cm) 0.785 Volume: (cm) 0.079 % Reduction in Area: 0% % Reduction in Volume: 0% Epithelialization: Small (1-33%) Tunneling: No Undermining: No Wound Description Classification: Category/Stage III Wound Margin: Distinct, outline attached Exudate Amount: Medium Exudate Type: Serosanguineous Exudate Color: red, brown Foul Odor After Cleansing: No Slough/Fibrino No Wound Bed Granulation Amount: Large (67-100%) Exposed Structure Granulation  Quality: Red, Pink Fascia Exposed: No Necrotic Amount: None Present (0%) Fat Layer (Subcutaneous Tissue) Exposed: Yes Tendon Exposed: No Muscle Exposed: No Joint Exposed: No Bone Exposed: No Cameron Notes Wound #2 (Lower Leg) Wound Laterality: Right, Posterior Cleanser Soap and Water Discharge Instruction: May shower and wash wound with dial antibacterial soap and water prior to dressing change. Peri-Wound Care Triamcinolone 15 (g) Discharge Instruction: Use triamcinolone in clinic. Sween Lotion (Moisturizing lotion) Discharge Instruction: Apply moisturizing lotion as directed Topical Primary Dressing Hydrofera Blue Ready Foam, 2.5 x2.5 in Discharge Instruction: Apply to wound bed as instructed Secondary Dressing Zetuvit Plus 4x8 in Discharge Instruction: Apply over primary dressing as directed. Secured With Compression Wrap ThreePress (3 layer compression wrap) Discharge Instruction: Apply three layer compression as directed. Compression Stockings Add-Ons Electronic Signature(s) Signed: 08/12/2021 5:26:44 PM By: Deon Pilling RN, BSN Signed: 08/13/2021 8:04:36 AM By: Melissa Creamer RN, BSN Entered By: Deon Pilling on 08/12/2021 10:13:23 -------------------------------------------------------------------------------- Vitals Details Patient Name: Date of Service: [name]. 08/12/2021 9:00 A M Medical Record Number: [id number] Patient Account Number: [id number] Date of Birth/Sex: Treating RN: [birth year]Cameron (76 y.o. [name] Primary Care Madilynn Montante: Cathlean Cower Other Clinician: Referring Khiara Shuping: Treating Almina Schul/Extender: [name]: 0 Vital Signs Time Taken: 09:25 Temperature (F): 98.3 Height (in): 67 Pulse (bpm): 91 Source: Stated Respiratory Rate (breaths/min): 18 Weight (lbs): 152 Blood Pressure (mmHg): 127/85 Source: Stated Capillary Blood Glucose (mg/dl): 180 Body Mass Index (BMI): 23.8 Reference  Range: 80  - 120 mg / dl Electronic Signature(s) Signed: 08/13/2021 8:04:36 AM By: Melissa Creamer RN, BSN Entered By: [name] on 08/12/2021 09:40:06

## 2021-08-13 NOTE — Progress Notes (Signed)
SOPHINA MITTEN ([id number]) , Visit Report for 08/12/2021 Abuse Risk Screen Details Patient Name: Date of Service: Melissa, Cameron 08/12/2021 9:00 [place] Record Number: [id number] Patient Account Number: [id number] Date of Birth/Sex: Treating RN: 02-11-1946 (76 y.o. [name] Primary Care Win Guajardo: Cathlean Cower Other Clinician: Referring Mariesa Grieder: Treating Odis Turck/Extender: Roque Cash in Treatment: 0 Abuse Risk Screen Items Answer ABUSE RISK SCREEN: Has anyone close to you tried to hurt or harm you recentlyo No Do you feel uncomfortable with anyone in your familyo No Has anyone forced you do things that you didnt want to doo No Electronic Signature(s) Signed: 08/13/2021 8:04:36 AM By: Sharyn Creamer RN, BSN Entered By: Sharyn Creamer on 08/12/2021 09:44:40 -------------------------------------------------------------------------------- Activities of Daily Living Details Patient Name: Date of Service: Melissa, Cameron 08/12/2021 9:00 [place] Record Number: [id number] Patient Account Number: [id number] Date of Birth/Sex: Treating RN: 08-02-1945 (76 y.o. [name] Primary Care Pharrell Ledford: Cathlean Cower Other Clinician: Referring Raylyn Speckman: Treating Leahanna Buser/Extender: Roque Cash in Treatment: 0 Activities of Daily Living Items Answer Activities of Daily Living (Please select one for each item) Drive Automobile Not Able T Medications ake Need Assistance Use T elephone Need Assistance Care for Appearance Need Assistance Use T oilet Need Assistance Bath / Shower Need Assistance Dress Self Need Assistance Feed Self Need Assistance Walk Not Able Get In / Out Bed Need Assistance Housework Not Able Prepare Meals Not Able Handle Money Need Assistance Shop for Self Not Able Electronic Signature(s) Signed: 08/13/2021 8:04:36 AM By: Sharyn Creamer RN, BSN Entered By: Sharyn Creamer on 08/12/2021  09:46:18 -------------------------------------------------------------------------------- Education Screening Details Patient Name: Date of Service: Melissa Beau 08/12/2021 9:00 [place] Record Number: [id number] Patient Account Number: [id number] Date of Birth/Sex: Treating RN: [birth year] (76 y.o. [name] Primary Care Deivi Huckins: Cathlean Cower Other Clinician: Referring Taite Baldassari: Treating Jaysa Kise/Extender: Roque Cash in Treatment: 0 Primary Learner Assessed: Patient Learning Preferences/Education Level/Primary Language Learning Preference: Explanation, Demonstration, Printed Material Highest Education Level: High School Preferred Language: English Cognitive Barrier Language Barrier: No Translator Needed: No Memory Deficit: Yes Emotional Barrier: No Cultural/Religious Beliefs Affecting Medical Care: No Physical Barrier Impaired Vision: No Impaired Hearing: No Decreased Hand dexterity: Yes Limitations: Rt side affected by stroke Knowledge/Comprehension Knowledge Level: Low Comprehension Level: Low Ability to understand written instructions: Low Ability to understand verbal instructions: Low Motivation Anxiety Level: Anxious Cooperation: Cooperative Education Importance: Acknowledges Need Interest in Health Problems: Asks Questions Perception: Coherent Willingness to Engage in Self-Management Medium Activities: Readiness to Engage in Self-Management Medium Activities: Electronic Signature(s) Signed: 08/13/2021 8:04:36 AM By: Sharyn Creamer RN, BSN Entered By: Sharyn Creamer on 08/12/2021 09:50:23 -------------------------------------------------------------------------------- Fall Risk Assessment Details Patient Name: Date of Service: Melissa Beau. 08/12/2021 9:00 [place] Record Number: [id number] Patient Account Number: [id number] Date of Birth/Sex: Treating RN: 1945-11-16 (76 y.o. [name] Primary Care Trudee Chirino:  Cathlean Cower Other Clinician: Referring Tanajah Boulter: Treating Shery [place]a/Extender: Roque Cash in Treatment: 0 Fall Risk Assessment Items Have you had 2 or more falls in the last 12 monthso 0 No Have you had any fall that resulted in injury in the last 12 monthso 0 No FALLS RISK SCREEN History of falling - immediate or within 3 months 0 No Secondary diagnosis (Do you have 2 or more medical diagnoseso) 15 Yes Ambulatory aid None/bed rest/wheelchair/nurse 0 Yes Crutches/cane/walker 0 No Furniture 0 No Intravenous therapy Access/Saline/Heparin Lock 0 No  Gait/Transferring Normal/ bed rest/ wheelchair 0 Yes Weak (short steps with or without shuffle, stooped but able to lift head while walking, may seek 0 No support from furniture) Impaired (short steps with shuffle, may have difficulty arising from chair, head down, impaired 0 No balance) Mental Status Oriented to own ability 0 Yes Electronic Signature(s) Signed: 08/13/2021 8:04:36 AM By: Sharyn Creamer RN, BSN Entered By: Sharyn Creamer on 08/12/2021 09:52:02 -------------------------------------------------------------------------------- Foot Assessment Details Patient Name: Date of Service: Melissa Beau. 08/12/2021 9:00 [place] Record Number: [id number] Patient Account Number: [id number] Date of Birth/Sex: Treating RN: 09/20/1945 (76 y.o. [name] Primary Care Steffani Dionisio: Cathlean Cower Other Clinician: Referring Krishana Lutze: Treating Camey Edell/Extender: Roque Cash in Treatment: 0 Foot Assessment Items Site Locations + = Sensation present, - = Sensation absent, C = Callus, U = Ulcer R = Redness, W = Warmth, M = Maceration, PU = Pre-ulcerative lesion F = Fissure, S = Swelling, D = Dryness Assessment Right: Left: Other Deformity: No No Prior Foot Ulcer: No No Prior Amputation: No No Charcot Joint: No No Ambulatory Status: Gait: Electronic Signature(s) Signed: 08/13/2021  8:04:36 AM By: Sharyn Creamer RN, BSN Entered By: Sharyn Creamer on 08/12/2021 09:56:09 -------------------------------------------------------------------------------- Nutrition Risk Screening Details Patient Name: Date of Service: Melissa Beau. 08/12/2021 9:00 [place] Record Number: [id number] Patient Account Number: [id number] Date of Birth/Sex: Treating RN: Jul 28, 1945 (76 y.o. [name] Primary Care Kanen Mottola: Cathlean Cower Other Clinician: Referring Imaad Reuss: Treating Kaulana Brindle/Extender: Roque Cash in Treatment: 0 Height (in): 67 Weight (lbs): 152 Body Mass Index (BMI): 23.8 Nutrition Risk Screening Items Score Screening NUTRITION RISK SCREEN: I have an illness or condition that made me change the kind and/or amount of food I eat 2 Yes I eat fewer than two meals per day 0 No I eat few fruits and vegetables, or milk products 0 No I have three or more drinks of beer, liquor or wine almost every day 0 No I have tooth or mouth problems that make it hard for me to eat 0 No I don't always have enough money to buy the food I need 0 No I eat alone most of the time 0 No I take three or more different prescribed or over-the-counter drugs a day 0 No Without wanting to, I have lost or gained 10 pounds in the last six months 0 No I am not always physically able to shop, cook and/or feed myself 2 Yes Nutrition Protocols Good Risk Protocol Moderate Risk Protocol High Risk Proctocol Risk Level: Moderate Risk Score: 4 Electronic Signature(s) Signed: 08/13/2021 8:04:36 AM By: Sharyn Creamer RN, BSN Entered By: Sharyn Creamer on 08/12/2021 09:54:33

## 2021-08-13 NOTE — Progress Notes (Signed)
[name] ([id number]) , Visit Report for 08/12/2021 Chief Complaint Document Details Patient Name: Date of Service: [name] 08/12/2021 9:00 [place] Record Number: [id number] Patient Account Number: [id number] Date of Birth/Sex: Treating RN: Jun 08, 1946 (76 y.o. [name] Primary Care Provider: Cathlean Cower Other Clinician: Referring Provider: Treating Provider/Extender: Roque Cash in Treatment: 0 Information Obtained from: Patient Chief Complaint Pressure ulcer right posterior leg from knee immobilizer Electronic Signature(s) Signed: 08/12/2021 10:05:40 AM By: Worthy Keeler PA-C Entered By: Worthy Keeler on 08/12/2021 10:05:40 -------------------------------------------------------------------------------- Debridement Details Patient Name: Date of Service: Melissa Beau. 08/12/2021 9:00 [place] Record Number: [id number] Patient Account Number: [id number] Date of Birth/Sex: Treating RN: [birth year] (76 y.o. [name], Tammi Klippel Primary Care Provider: Cathlean Cower Other Clinician: Referring Provider: Treating Provider/Extender: Roque Cash in Treatment: 0 Debridement Performed for Assessment: Wound #2 Right,Posterior Lower Leg Performed By: Clinician Deon Pilling, RN Debridement Type: Chemical/Enzymatic/Mechanical Agent Used: gauze and wound cleanser Level of Consciousness (Pre-procedure): Awake and Alert Pre-procedure Verification/Time Out No Taken: Bleeding: None Response to Treatment: Procedure was tolerated well Level of Consciousness (Post- Awake and Alert procedure): Post Debridement Measurements of Total Wound Length: (cm) 1 Stage: Category/Stage III Width: (cm) 1 Depth: (cm) 0.1 Volume: (cm) 0.079 Character of Wound/Ulcer Post Debridement: Stable Post Procedure Diagnosis Same as Pre-procedure Electronic Signature(s) Signed: 08/12/2021 5:11:08 PM By: Worthy Keeler PA-C Signed: 08/12/2021 5:26:44  PM By: Deon Pilling RN, BSN Entered By: Deon Pilling on 08/12/2021 10:17:23 -------------------------------------------------------------------------------- HPI Details Patient Name: Date of Service: Melissa Beau. 08/12/2021 9:00 A M Medical Record Number: [id number] Patient Account Number: [id number] Date of Birth/Sex: Treating RN: 1946-05-10 (76 y.o. [name] Primary Care Provider: Cathlean Cower Other Clinician: Referring Provider: Treating Provider/Extender: Roque Cash in Treatment: 0 History of Present Illness HPI Description: 01/07/2021 upon evaluation today patient presents for initial evaluation here in our clinic concerning issues that she has been having with wound on her right posterior lower leg. Fortunately there does not appear to be any signs of infection. This actually occurred in December 2021 when she had broken her leg and subsequently had a knee immobilizer in place. This caused a pressure injury here. Subsequently she also has a CVA that occurred in 2016 leaving her with right-sided weakness. This has led to her having issues with breakdown over this region and this seems to be an area that does tend to breakdown much more quickly unfortunately. She does have a silver alginate and border foam dressing that her son's been using although he tells me is out of stuff now. Her ABI on the right was 0.94 she is on Eliquis but again there is no need for sharp debridement based on what I see today. The patient does have a history of diabetes mellitus type 2, hypertension, congestive heart failure, and coronary artery disease along with a CVA. 03/04/2021 upon evaluation today patient's leg ulcer actually appears to be doing roughly the same as when I last saw it. Apparently had gotten a lot better and then her caregiver tells me that he noticed that her leg slipped off a couple nights in a row and was on the bed resting and that seems to have caused things  to worsen. For that reason she did come back in for reevaluation she also has some swelling of the leg I potentially think we could do something for her to try to offload  and keep some of the pressure off of this region. She is in agreement with giving this a trial. Fortunately there does not appear to be any signs of active infection at this time which is great news. Readmission: 08/12/2021 patient presents for reevaluation here in the clinic when I last saw her back in August of last year she was having an issue with a wound on the posterior/lateral aspect of her left leg. We did use Hydrofera Blue and a 3 layer wrap which actually did extremely well for her. They were able to get this healed with the help of home health. With that being said unfortunately this has reopened according to her son its been an ongoing issue which again I think has been coming and going but right now they are just not able to get this closed. Subsequently she does have weakness in this leg due to a history of stroke. I do believe that we can try to get this under control I do believe there may be some offloading things we can do as well to try and help out in this regard. Electronic Signature(s) Signed: 08/12/2021 10:30:18 AM By: Worthy Keeler PA-C Entered By: Worthy Keeler on 08/12/2021 10:30:18 -------------------------------------------------------------------------------- Physical Exam Details Patient Name: Date of Service: [name] 08/12/2021 9:00 A M Medical Record Number: [id number] Patient Account Number: [id number] Date of Birth/Sex: Treating RN: 09-14-1945 (76 y.o. [name] Primary Care Provider: Cathlean Cower Other Clinician: Referring Provider: Treating Provider/Extender: Roque Cash in Treatment: 0 Constitutional sitting or standing blood pressure is within target range for patient.. pulse regular and within target range for patient.Marland Kitchen respirations regular,  non-labored and within target range for patient.Marland Kitchen temperature within target range for patient.. Well-nourished and well-hydrated in no acute distress. Eyes conjunctiva clear no eyelid edema noted. pupils equal round and reactive to light and accommodation. Ears, Nose, Mouth, and Throat no gross abnormality of ear auricles or external auditory canals. normal hearing noted during conversation. mucus membranes moist. Respiratory normal breathing without difficulty. Cardiovascular 2+ dorsalis pedis/posterior tibialis pulses. 1+ pitting edema of the bilateral lower extremities. Psychiatric this patient is able to make decisions and demonstrates good insight into disease process. Alert and Oriented x 3. pleasant and cooperative. Notes Upon inspection patient's wound bed actually showed signs of good granulation and epithelization at this point. Fortunately I do not see any signs of active infection locally nor systemically at this point which is great news and overall I am extremely pleased with where we stand. Electronic Signature(s) Signed: 08/12/2021 10:31:13 AM By: Worthy Keeler PA-C Previous Signature: 08/12/2021 10:30:34 AM Version By: Worthy Keeler PA-C Entered By: Worthy Keeler on 08/12/2021 10:31:13 -------------------------------------------------------------------------------- Physician Orders Details Patient Name: Date of Service: Melissa Beau 08/12/2021 9:00 A M Medical Record Number: [id number] Patient Account Number: [id number] Date of Birth/Sex: Treating RN: [birth year] (76 y.o. [name], Meta.Reding Primary Care Provider: Cathlean Cower Other Clinician: Referring Provider: Treating Provider/Extender: Roque Cash in Treatment: 0 Verbal / Phone Orders: No Diagnosis Coding ICD-10 Coding Code Description [phone] Pressure ulcer of other site, stage 3 G81.01 Flaccid hemiplegia affecting right dominant side E11.622 Type 2 diabetes mellitus with other skin  ulcer I10 Essential (primary) hypertension I50.42 Chronic combined systolic (congestive) and diastolic (congestive) heart failure I25.10 Atherosclerotic heart disease of native coronary artery without angina pectoris Z79.01 Long term (current) use of anticoagulants Follow-up Appointments ppointment in 1 week. Margarita Grizzle Wednesday and [place],  RN Room 1. Return A Other: - Patient to purchase prevalon boot off [employer] to aid in pressure relief of wound bed. Use while in chair or bed. Bathing/ Shower/ Hygiene May shower with protection but do not get wound dressing(s) wet. Edema Control - Lymphedema / SCD / Other Elevate legs to the level of the heart or above for 30 minutes daily and/or when sitting, a frequency of: - 3-4 times a day throughout the day. Moisturize legs daily. - every night before bed. Off-Loading Other: - Prevalon Boot purchase and start to use while resting in chair and bed. Wound Treatment Wound #2 - Lower Leg Wound Laterality: Right, Posterior Cleanser: Soap and Water 1 x Per Week Discharge Instructions: May shower and wash wound with dial antibacterial soap and water prior to dressing change. Peri-Wound Care: Triamcinolone 15 (g) 1 x Per Week Discharge Instructions: Use triamcinolone in clinic. Peri-Wound Care: Sween Lotion (Moisturizing lotion) 1 x Per Week Discharge Instructions: Apply moisturizing lotion as directed Prim Dressing: Hydrofera Blue Ready Foam, 2.5 x2.5 in 1 x Per Week ary Discharge Instructions: Apply to wound bed as instructed Secondary Dressing: Zetuvit Plus 4x8 in 1 x Per Week Discharge Instructions: Apply over primary dressing as directed. Compression Wrap: ThreePress (3 layer compression wrap) 1 x Per Week Discharge Instructions: Apply three layer compression as directed. Electronic Signature(s) Signed: 08/12/2021 5:11:08 PM By: Worthy Keeler PA-C Signed: 08/12/2021 5:26:44 PM By: Deon Pilling RN, BSN Entered By: Deon Pilling on 08/12/2021  10:22:48 -------------------------------------------------------------------------------- Problem List Details Patient Name: Date of Service: Melissa Beau. 08/12/2021 9:00 [place] Record Number: [id number] Patient Account Number: [id number] Date of Birth/Sex: Treating RN: May 24, 1946 (75 y.o. [name], Meta.Reding Primary Care Provider: Cathlean Cower Other Clinician: Referring Provider: Treating Provider/Extender: Roque Cash in Treatment: 0 Active Problems ICD-10 Encounter Code Description Active Date MDM Diagnosis [phone] Pressure ulcer of other site, stage 3 08/12/2021 No Yes G81.01 Flaccid hemiplegia affecting right dominant side 08/12/2021 No Yes E11.622 Type 2 diabetes mellitus with other skin ulcer 08/12/2021 No Yes I10 Essential (primary) hypertension 08/12/2021 No Yes I50.42 Chronic combined systolic (congestive) and diastolic (congestive) heart failure 08/12/2021 No Yes I25.10 Atherosclerotic heart disease of native coronary artery without angina pectoris 08/12/2021 No Yes Z79.01 Long term (current) use of anticoagulants 08/12/2021 No Yes Inactive Problems Resolved Problems Electronic Signature(s) Signed: 08/12/2021 10:05:25 AM By: Worthy Keeler PA-C Entered By: Worthy Keeler on 08/12/2021 10:05:25 -------------------------------------------------------------------------------- Progress Note Details Patient Name: Date of Service: Melissa Beau. 08/12/2021 9:00 [place] Record Number: [id number] Patient Account Number: [id number] Date of Birth/Sex: Treating RN: [birth year] (76 y.o. [name], Tammi Klippel Primary Care Provider: Cathlean Cower Other Clinician: Referring Provider: Treating Provider/Extender: Roque Cash in Treatment: 0 Subjective Chief Complaint Information obtained from Patient Pressure ulcer right posterior leg from knee immobilizer History of Present Illness (HPI) 01/07/2021 upon evaluation today patient presents for  initial evaluation here in our clinic concerning issues that she has been having with wound on her right posterior lower leg. Fortunately there does not appear to be any signs of infection. This actually occurred in December 2021 when she had broken her leg and subsequently had a knee immobilizer in place. This caused a pressure injury here. Subsequently she also has a CVA that occurred in 2016 leaving her with right-sided weakness. This has led to her having issues with breakdown over this region and this seems to be an area that does tend  to breakdown much more quickly unfortunately. She does have a silver alginate and border foam dressing that her son's been using although he tells me is out of stuff now. Her ABI on the right was 0.94 she is on Eliquis but again there is no need for sharp debridement based on what I see today. The patient does have a history of diabetes mellitus type 2, hypertension, congestive heart failure, and coronary artery disease along with a CVA. 03/04/2021 upon evaluation today patient's leg ulcer actually appears to be doing roughly the same as when I last saw it. Apparently had gotten a lot better and then her caregiver tells me that he noticed that her leg slipped off a couple nights in a row and was on the bed resting and that seems to have caused things to worsen. For that reason she did come back in for reevaluation she also has some swelling of the leg I potentially think we could do something for her to try to offload and keep some of the pressure off of this region. She is in agreement with giving this a trial. Fortunately there does not appear to be any signs of active infection at this time which is great news. Readmission: 08/12/2021 patient presents for reevaluation here in the clinic when I last saw her back in August of last year she was having an issue with a wound on the posterior/lateral aspect of her left leg. We did use Hydrofera Blue and a 3 layer wrap  which actually did extremely well for her. They were able to get this healed with the help of home health. With that being said unfortunately this has reopened according to her son its been an ongoing issue which again I think has been coming and going but right now they are just not able to get this closed. Subsequently she does have weakness in this leg due to a history of stroke. I do believe that we can try to get this under control I do believe there may be some offloading things we can do as well to try and help out in this regard. Patient History Information obtained from Chart. Allergies pork-derived products Family History Cancer - Child, Diabetes - Child, Heart Disease - Father,Mother, Hypertension - Father,Mother, No family history of Hereditary Spherocytosis, Seizures, Stroke, Thyroid Problems, Tuberculosis. Social History Former smoker, Marital Status - Married, Alcohol Use - Never, Drug Use - No History, Caffeine Use - Moderate. Medical History Hematologic/Lymphatic Patient has history of Lymphedema Cardiovascular Patient has history of Congestive Heart Failure, Coronary Artery Disease, Hypertension Endocrine Patient has history of Type II Diabetes Neurologic Patient has history of Paraplegia - Hemiplegia right side Hospitalization/Surgery History - Cardiac surgery (Cath without stent. - Colonoscopy. Medical A Surgical History Notes nd Cardiovascular CVA 2016 Gastrointestinal Lower GI Bleed Acute Blood Loss anemia Musculoskeletal Past fractured femur Objective Constitutional sitting or standing blood pressure is within target range for patient.. pulse regular and within target range for patient.Marland Kitchen respirations regular, non-labored and within target range for patient.Marland Kitchen temperature within target range for patient.. Well-nourished and well-hydrated in no acute distress. Vitals Time Taken: 9:25 AM, Height: 67 in, Source: Stated, Weight: 152 lbs, Source: Stated, BMI: 23.8,  Temperature: 98.3 F, Pulse: 91 bpm, Respiratory Rate: 18 breaths/min, Blood Pressure: 127/85 mmHg, Capillary Blood Glucose: 180 mg/dl. Eyes conjunctiva clear no eyelid edema noted. pupils equal round and reactive to light and accommodation. Ears, Nose, Mouth, and Throat no gross abnormality of ear auricles or external auditory canals.  normal hearing noted during conversation. mucus membranes moist. Respiratory normal breathing without difficulty. Cardiovascular 2+ dorsalis pedis/posterior tibialis pulses. 1+ pitting edema of the bilateral lower extremities. Psychiatric this patient is able to make decisions and demonstrates good insight into disease process. Alert and Oriented x 3. pleasant and cooperative. General Notes: Upon inspection patient's wound bed actually showed signs of good granulation and epithelization at this point. Fortunately I do not see any signs of active infection locally nor systemically at this point which is great news and overall I am extremely pleased with where we stand. Integumentary (Hair, Skin) Wound #2 status is Open. Original cause of wound was Gradually Appeared. The date acquired was: 07/29/2021. The wound is located on the Right,Posterior Lower Leg. The wound measures 1cm length x 1cm width x 0.1cm depth; 0.785cm^2 area and 0.079cm^3 volume. There is Fat Layer (Subcutaneous Tissue) exposed. There is no tunneling or undermining noted. There is a medium amount of serosanguineous drainage noted. The wound margin is distinct with the outline attached to the wound base. There is large (67-100%) red, pink granulation within the wound bed. There is no necrotic tissue within the wound bed. Assessment Active Problems ICD-10 Pressure ulcer of other site, stage 3 Flaccid hemiplegia affecting right dominant side Type 2 diabetes mellitus with other skin ulcer Essential (primary) hypertension Chronic combined systolic (congestive) and diastolic (congestive) heart  failure Atherosclerotic heart disease of native coronary artery without angina pectoris Long term (current) use of anticoagulants Procedures Wound #2 Pre-procedure diagnosis of Wound #2 is a Pressure Ulcer located on the Right,Posterior Lower Leg . There was a Chemical/Enzymatic/Mechanical debridement performed by Deon Pilling, RN.. Other agent used was gauze and wound cleanser. There was no bleeding. The procedure was tolerated well. Post Debridement Measurements: 1cm length x 1cm width x 0.1cm depth; 0.079cm^3 volume. Post debridement Stage noted as Category/Stage III. Character of Wound/Ulcer Post Debridement is stable. Post procedure Diagnosis Wound #2: Same as Pre-Procedure Pre-procedure diagnosis of Wound #2 is a Pressure Ulcer located on the Right,Posterior Lower Leg . There was a Three Layer Compression Therapy Procedure by Deon Pilling, RN. Post procedure Diagnosis Wound #2: Same as Pre-Procedure Plan Follow-up Appointments: Return Appointment in 1 week. Margarita Grizzle Wednesday and Tammi Klippel, RN Room 1. Other: - Patient to purchase prevalon boot off amaz[employer] to aid in pressure relief of wound bed. Use while in chair or bed. Bathing/ Shower/ Hygiene: May shower with protection but do not get wound dressing(s) wet. Edema Control - Lymphedema / SCD / Other: Elevate legs to the level of the heart or above for 30 minutes daily and/or when sitting, a frequency of: - 3-4 times a day throughout the day. Moisturize legs daily. - every night before bed. Off-Loading: Other: - Prevalon Boot purchase and start to use while resting in chair and bed. WOUND #2: - Lower Leg Wound Laterality: Right, Posterior Cleanser: Soap and Water 1 x Per Week/ Discharge Instructions: May shower and wash wound with dial antibacterial soap and water prior to dressing change. Peri-Wound Care: Triamcinolone 15 (g) 1 x Per Week/ Discharge Instructions: Use triamcinolone in clinic. Peri-Wound Care: Sween Lotion (Moisturizing  lotion) 1 x Per Week/ Discharge Instructions: Apply moisturizing lotion as directed Prim Dressing: Hydrofera Blue Ready Foam, 2.5 x2.5 in 1 x Per Week/ ary Discharge Instructions: Apply to wound bed as instructed Secondary Dressing: Zetuvit Plus 4x8 in 1 x Per Week/ Discharge Instructions: Apply over primary dressing as directed. Com pression Wrap: ThreePress (3 layer compression wrap) 1 x  Per Week/ Discharge Instructions: Apply three layer compression as directed. 1. I would recommend that we going to continue with the answers as before and the patient is in agreement with that plan this will include the use of the [facility] dressing which I think has done well in the past. They are in agreement with that plan. 2. Also can recommend that we go ahead and continue with the 3 layer compression wrap which did well to keep her edema under control before. 3. I am also going to suggest that we have the patient continue to monitor for any signs of worsening or infection. Obviously if anything changes her son should let me know but otherwise we will plan to see her back next week for repeat evaluation with regard to the leg and make sure that the wound is doing quite well at that time. 4. I did recommend a Prevalon offloading boot as a good option here to try to keep some of the edema under better control I think that is can be something that would benefit her long-term. We will see patient back for reevaluation in 1 week here in the clinic. If anything worsens or changes patient will contact our office for additional recommendations. Electronic Signature(s) Signed: 08/12/2021 10:32:10 AM By: Worthy Keeler PA-C Entered By: Worthy Keeler on 08/12/2021 10:32:09 -------------------------------------------------------------------------------- HxROS Details Patient Name: Date of Service: Melissa Beau. 08/12/2021 9:00 [place] Record Number: [id number] Patient Account Number: [id number] Date  of Birth/Sex: Treating RN: 1945-12-07 (76 y.o. Elam Dutch Primary Care Provider: Cathlean Cower Other Clinician: Referring Provider: Treating Provider/Extender: Roque Cash in Treatment: 0 Information Obtained From Chart Hematologic/Lymphatic Medical History: Positive for: Lymphedema Cardiovascular Medical History: Positive for: Congestive Heart Failure; Coronary Artery Disease; Hypertension Past Medical History Notes: CVA 2016 Gastrointestinal Medical History: Past Medical History Notes: Lower GI Bleed Acute Blood Loss anemia Endocrine Medical History: Positive for: Type II Diabetes Time with diabetes: 6 years Treated with: Insulin, Oral agents Blood sugar tested every day: Yes Tested : 3x day Musculoskeletal Medical History: Past Medical History Notes: Past fractured femur Neurologic Medical History: Positive for: Paraplegia - Hemiplegia right side Immunizations Pneumococcal Vaccine: Received Pneumococcal Vaccination: Yes Received Pneumococcal Vaccination On or After 60th Birthday: No Implantable Devices No devices added Hospitalization / Surgery History Type of Hospitalization/Surgery Cardiac surgery (Cath without stent Colonoscopy Family and Social History Cancer: Yes - Child; Diabetes: Yes - Child; Heart Disease: Yes - Father,Mother; Hereditary Spherocytosis: No; Hypertension: Yes - Father,Mother; Seizures: No; Stroke: No; Thyroid Problems: No; Tuberculosis: No; Former smoker; Marital Status - Married; Alcohol Use: Never; Drug Use: No History; Caffeine Use: Moderate; Financial Concerns: No; Food, Clothing or Shelter Needs: No; Support System Lacking: No; Transportation Concerns: No Electronic Signature(s) Signed: 08/12/2021 4:36:42 PM By: Baruch Gouty RN, BSN Signed: 08/12/2021 5:11:08 PM By: Worthy Keeler PA-C Signed: 08/13/2021 8:04:36 AM By: Sharyn Creamer RN, BSN Previous Signature: 08/11/2021 3:13:20 PM Version By: Valeria Batman  EMT Entered By: Sharyn Creamer on 08/12/2021 09:43:18 -------------------------------------------------------------------------------- SuperBill Details Patient Name: Date of Service: [place], SHERR 08/12/2021 Medical Record Number: [id number] Patient Account Number: [id number] Date of Birth/Sex: Treating RN: [birth year] (76 y.o. [name] Primary Care Provider: Cathlean Cower Other Clinician: Referring Provider: Treating Provider/Extender: Roque Cash in Treatment: 0 Diagnosis Coding ICD-10 Codes Code Description [phone] Pressure ulcer of other site, stage 3 G81.01 Flaccid hemiplegia affecting right dominant side E11.622 Type 2 diabetes mellitus with  other skin ulcer I10 Essential (primary) hypertension I50.42 Chronic combined systolic (congestive) and diastolic (congestive) heart failure I25.10 Atherosclerotic heart disease of native coronary artery without angina pectoris Z79.01 Long term (current) use of anticoagulants Facility Procedures Physician Procedures : CPT4 Code Description Modifier [id number] - WC PHYS LEVEL 4 - EST PT ICD-10 Diagnosis Description L89.893 Pressure ulcer of other site, stage 3 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

## 2021-08-19 ENCOUNTER — Encounter (HOSPITAL_BASED_OUTPATIENT_CLINIC_OR_DEPARTMENT_OTHER): Payer: Medicare PPO | Admitting: Internal Medicine

## 2021-08-19 ENCOUNTER — Other Ambulatory Visit: Payer: Self-pay

## 2021-08-19 DIAGNOSIS — Z09 Encounter for follow-up examination after completed treatment for conditions other than malignant neoplasm: Secondary | ICD-10-CM | POA: Diagnosis not present

## 2021-08-20 NOTE — Progress Notes (Signed)
[name] ([id number]) , Visit Report for 08/19/2021 SuperBill Details Patient Name: Date of Service: [name], [name] 08/19/2021 Medical Record Number: [id number] Patient Account Number: [id number] Date of Birth/Sex: Treating RN: [birth year] (76 y.o. 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 E11.622 Type 2 diabetes mellitus with other skin ulcer I10 Essential (primary) hypertension I50.42 Chronic combined systolic (congestive) and diastolic (congestive) heart failure I25.10 Atherosclerotic heart disease of native coronary artery without angina pectoris Z79.01 Long term (current) use of anticoagulants Facility Procedures CPT4 Code Description Modifier Quantity [phone] (Facility Use Only) [phone] - APPLY MULTLAY COMPRS LWR RT LEG 1 Electronic Signature(s) Signed: 08/19/2021 4:36:32 PM By: Linton Ham MD Signed: 08/20/2021 6:28:13 PM By: Levan Hurst RN, BSN Entered By: Levan Hurst on 08/19/2021 13:44:59

## 2021-08-26 ENCOUNTER — Other Ambulatory Visit: Payer: Self-pay

## 2021-08-26 ENCOUNTER — Encounter (HOSPITAL_BASED_OUTPATIENT_CLINIC_OR_DEPARTMENT_OTHER): Payer: Medicare PPO | Admitting: Physician Assistant

## 2021-08-26 DIAGNOSIS — Z09 Encounter for follow-up examination after completed treatment for conditions other than malignant neoplasm: Secondary | ICD-10-CM | POA: Diagnosis not present

## 2021-08-26 NOTE — Progress Notes (Addendum)
[name] ([id number]) , Visit Report for 08/26/2021 Chief Complaint Document Details Patient Name: Date of Service: [name], PALLA 08/26/2021 10:45 A M Medical Record Number: [id number] Patient Account Number: [id number] Date of Birth/Sex: Treating RN: September 20, 1945 (76 y.o. Elam Dutch Primary Care Provider: Cathlean Cower Other Clinician: Referring Provider: Treating Provider/Extender: Roque Cash in Treatment: 2 Information Obtained from: Patient Chief Complaint Pressure ulcer right posterior leg from knee immobilizer Electronic Signature(s) Signed: 08/26/2021 11:30:12 AM By: Worthy Keeler PA-C Entered By: Worthy Keeler on 08/26/2021 11:30:12 -------------------------------------------------------------------------------- HPI Details Patient Name: Date of Service: [name], [name] 08/26/2021 10:45 A M Medical Record Number: [id number] Patient Account Number: [id number] Date of Birth/Sex: Treating RN: [birth year] (76 y.o. Elam Dutch Primary Care Provider: Cathlean Cower Other Clinician: Referring Provider: Treating Provider/Extender: Roque Cash in Treatment: 2 History of Present Illness HPI Description: 01/07/2021 upon evaluation today patient presents for initial evaluation here in our clinic concerning issues that she has been having with wound on her right posterior lower leg. Fortunately there does not appear to be any signs of infection. This actually occurred in December 2021 when she had broken her leg and subsequently had a knee immobilizer in place. This caused a pressure injury here. Subsequently she also has a CVA that occurred in 2016 leaving her with right-sided weakness. This has led to her having issues with breakdown over this region and this seems to be an area that does tend to breakdown much more quickly unfortunately. She does have a silver alginate and border foam dressing that her son's been using  although he tells me is out of stuff now. Her ABI on the right was 0.94 she is on Eliquis but again there is no need for sharp debridement based on what I see today. The patient does have a history of diabetes mellitus type 2, hypertension, congestive heart failure, and coronary artery disease along with a CVA. 03/04/2021 upon evaluation today patient's leg ulcer actually appears to be doing roughly the same as when I last saw it. Apparently had gotten a lot better and then her caregiver tells me that he noticed that her leg slipped off a couple nights in a row and was on the bed resting and that seems to have caused things to worsen. For that reason she did come back in for reevaluation she also has some swelling of the leg I potentially think we could do something for her to try to offload and keep some of the pressure off of this region. She is in agreement with giving this a trial. Fortunately there does not appear to be any signs of active infection at this time which is great news. Readmission: 08/12/2021 patient presents for reevaluation here in the clinic when I last saw her back in August of last year she was having an issue with a wound on the posterior/lateral aspect of her left leg. We did use Hydrofera Blue and a 3 layer wrap which actually did extremely well for her. They were able to get this healed with the help of home health. With that being said unfortunately this has reopened according to her son its been an ongoing issue which again I think has been coming and going but right now they are just not able to get this closed. Subsequently she does have weakness in this leg due to a history of stroke. I do believe that we can try to get this under  control I do believe there may be some offloading things we can do as well to try and help out in this regard. 08/26/2021 upon evaluation today patient appears to be doing excellent in regard to her wound. In fact this appears to be completely  healed based on what I see currently. I do not see any evidence of anything remaining open and overall her son has done an excellent job taking care of her. Electronic Signature(s) Signed: 08/26/2021 11:52:12 AM By: Worthy Keeler PA-C Entered By: Worthy Keeler on 08/26/2021 11:52:12 -------------------------------------------------------------------------------- Physical Exam Details Patient Name: Date of Service: Melissa, Cameron 08/26/2021 10:45 A M Medical Record Number: [id number] Patient Account Number: [id number] Date of Birth/Sex: Treating RN: 1946/01/30 (76 y.o. Elam Dutch Primary Care Provider: Cathlean Cower Other Clinician: Referring Provider: Treating Provider/Extender: Roque Cash in Treatment: 2 Constitutional Well-nourished and well-hydrated in no acute distress. Respiratory normal breathing without difficulty. Psychiatric this patient is able to make decisions and demonstrates good insight into disease process. Alert and Oriented x 3. pleasant and cooperative. Notes Upon evaluation patient's wound bed actually showed good granulation epithelization at this point. Fortunately I do not see any evidence of active infection locally or systemically which is great news and overall I am extremely pleased with where we stand today. Electronic Signature(s) Signed: 08/26/2021 11:52:32 AM By: Worthy Keeler PA-C Entered By: Worthy Keeler on 08/26/2021 11:52:32 -------------------------------------------------------------------------------- Physician Orders Details Patient Name: Date of Service: [name], [name] 08/26/2021 10:45 A M Medical Record Number: [id number] Patient Account Number: [id number] Date of Birth/Sex: Treating RN: [birth year] (76 y.o. 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 Patient to wear own compression stockings every day. Moisturize legs daily. Other Edema Control Orders/Instructions: - 15-20 mm/Hg open toe compression stockings Wound Treatment Electronic Signature(s) Signed: 08/26/2021 4:57:32 PM By: Worthy Keeler PA-C Signed: 08/26/2021 5:31:46 PM By: Rhae Hammock RN Entered By: Rhae Hammock on 08/26/2021 11:29:21 -------------------------------------------------------------------------------- Problem List Details Patient Name: Date of Service: [name] 08/26/2021 10:45 A M Medical Record Number: [id number] Patient Account Number: [id number] Date of Birth/Sex: Treating RN: [birth year] (76 y.o. 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 08/12/2021 No Yes E11.622 Type 2 diabetes mellitus with other skin ulcer 08/12/2021 No Yes I10 Essential (primary) hypertension 08/12/2021 No Yes I50.42 Chronic combined  systolic (congestive) and diastolic (congestive) heart failure 08/12/2021 No Yes I25.10 Atherosclerotic heart disease of native coronary artery without angina pectoris 08/12/2021 No Yes Z79.01 Long term (  current) use of anticoagulants 08/12/2021 No Yes Inactive Problems Resolved Problems Electronic Signature(s) Signed: 08/26/2021 11:12:58 AM By: Worthy Keeler PA-C Entered By: Worthy Keeler on 08/26/2021 11:12:57 -------------------------------------------------------------------------------- Progress Note Details Patient Name: Date of Service: LILLIS, NUTTLE 08/26/2021 10:45 A M Medical Record Number: [id number] Patient Account Number: [id number] Date of Birth/Sex: Treating RN: August 06, 1945 (76 y.o. Elam Dutch Primary Care Provider: Cathlean Cower Other Clinician: Referring Provider: Treating Provider/Extender: Roque Cash in Treatment: 2 Subjective Chief Complaint Information obtained from Patient Pressure ulcer right posterior leg from knee immobilizer History of Present Illness (HPI) 01/07/2021 upon evaluation today patient presents for initial evaluation here in our clinic concerning issues that she has been having with wound on her right posterior lower leg. Fortunately there does not appear to be any signs of infection. This actually occurred in December 2021 when she had broken her leg and subsequently had a knee immobilizer in place. This caused a pressure injury here. Subsequently she also has a CVA that occurred in 2016 leaving her with right-sided weakness. This has led to her having issues with breakdown over this region and this seems to be an area that does tend to breakdown much more quickly unfortunately. She does have a silver alginate and border foam dressing that her son's been using although he tells me is out of stuff now. Her ABI on the right was 0.94 she is on Eliquis but again there is no need for sharp debridement based on what I see today.  The patient does have a history of diabetes mellitus type 2, hypertension, congestive heart failure, and coronary artery disease along with a CVA. 03/04/2021 upon evaluation today patient's leg ulcer actually appears to be doing roughly the same as when I last saw it. Apparently had gotten a lot better and then her caregiver tells me that he noticed that her leg slipped off a couple nights in a row and was on the bed resting and that seems to have caused things to worsen. For that reason she did come back in for reevaluation she also has some swelling of the leg I potentially think we could do something for her to try to offload and keep some of the pressure off of this region. She is in agreement with giving this a trial. Fortunately there does not appear to be any signs of active infection at this time which is great news. Readmission: 08/12/2021 patient presents for reevaluation here in the clinic when I last saw her back in August of last year she was having an issue with a wound on the posterior/lateral aspect of her left leg. We did use Hydrofera Blue and a 3 layer wrap which actually did extremely well for her. They were able to get this healed with the help of home health. With that being said unfortunately this has reopened according to her son its been an ongoing issue which again I think has been coming and going but right now they are just not able to get this closed. Subsequently she does have weakness in this leg due to a history of stroke. I do believe that we can try to get this under control I do believe there may be some offloading things we can do as well to try and help out in this regard. 08/26/2021 upon evaluation today patient appears to be doing excellent in regard to her wound. In fact this appears to be completely healed based on what I see currently.  I do not see any evidence of anything remaining open and overall her son has done an excellent job taking care of  her. Objective Constitutional Well-nourished and well-hydrated in no acute distress. Vitals Time Taken: 11:14 AM, Height: 67 in, Weight: 152 lbs, BMI: 23.8, Temperature: 98.7 F, Pulse: 74 bpm, Respiratory Rate: 17 breaths/min, Blood Pressure: 133/70 mmHg, Capillary Blood Glucose: 201 mg/dl. Respiratory normal breathing without difficulty. Psychiatric this patient is able to make decisions and demonstrates good insight into disease process. Alert and Oriented x 3. pleasant and cooperative. General Notes: Upon evaluation patient's wound bed actually showed good granulation epithelization at this point. Fortunately I do not see any evidence of active infection locally or systemically which is great news and overall I am extremely pleased with where we stand today. Integumentary (Hair, Skin) Wound #2 status is Open. Original cause of wound was Gradually Appeared. The date acquired was: 07/29/2021. The wound has been in treatment 2 weeks. The wound is located on the Right,Posterior Lower Leg. The wound measures 0cm length x 0cm width x 0cm depth; 0cm^2 area and 0cm^3 volume. There is a medium amount of serosanguineous drainage noted. Assessment Active Problems ICD-10 Pressure ulcer of other site, stage 3 Flaccid hemiplegia affecting right dominant side Type 2 diabetes mellitus with other skin ulcer Essential (primary) hypertension Chronic combined systolic (congestive) and diastolic (congestive) heart failure Atherosclerotic heart disease of native coronary artery without angina pectoris Long term (current) use of anticoagulants Plan Discharge From [facility]: Discharge from [place] Edema Control - Lymphedema / SCD / Other: Elevate legs to the level of the heart or above for 30 minutes daily and/or when sitting, a frequency of: Avoid standing for long periods of time. Patient to wear own compression stockings every day. Moisturize legs daily. Other Edema Control  Orders/Instructions: - 15-20 mm/Hg open toe compression stockings 1. Would recommend currently that we going continue with the wound care measures as before the patient is in agreement with plan. This includes the use of the compression working actually suggest an Ace wrap for her as I believe this will do well until they can get the compression socks I would suggest that for 2 weeks and then after 2 weeks she can then go to using the compression socks that we have ordered for her or her brother that they will be ordering. 2. I am also can recommend they continue to monitor and keep any pressure off of the back of the calf region. Obviously I think this can reopen if were not careful and that was discussed with her son who is her caregiver as well. We will see her back for follow-up visit as needed. Electronic Signature(s) Signed: 08/26/2021 11:53:22 AM By: Worthy Keeler PA-C Entered By: Worthy Keeler on 08/26/2021 11:53:22 -------------------------------------------------------------------------------- SuperBill Details Patient Name: Date of Service: KATHLEN, SAKURAI 08/26/2021 Medical Record Number: [id number] Patient Account Number: [id number] Date of Birth/Sex: Treating RN: 1946/06/04 (76 y.o. 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 E11.622 Type 2 diabetes mellitus with other skin ulcer I10 Essential (primary) hypertension I50.42 Chronic combined systolic (congestive) and diastolic (congestive) heart failure I25.10 Atherosclerotic heart disease of native coronary artery without angina pectoris Z79.01 Long term (current) use of anticoagulants Facility Procedures CPT4 Code: [id number] Description: [phone]  -  WOUND CARE VISIT-LEV 3 EST PT Modifier: Quantity: 1 Physician Procedures : CPT4 Code Description Modifier [id number] - WC PHYS LEVEL 3 - EST PT ICD-10 Diagnosis Description L89.893 Pressure ulcer of other site, stage 3 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

## 2021-08-26 NOTE — Progress Notes (Addendum)
[name] ([id number]) , Visit Report for 08/26/2021 Arrival Information Details Patient Name: Date of Service: Melissa, Cameron 08/26/2021 10:45 A M Medical Record Number: [id number] Patient Account Number: [id number] Date of Birth/Sex: Treating RN: 06-May-1946 (76 y.o. [name], [name] Primary Care Dawnya Grams: [name] Other Clinician: Referring [name]: Treating [name]: [name] in Treatment: 2 Visit Information History Since Last Visit Added or deleted any medications: No Patient Arrived: Wheel Chair Any new allergies or adverse reactions: No Arrival Time: 11:13 Had a fall or experienced change in No Accompanied By: son activities of daily living that may affect Transfer Assistance: Manual risk of falls: Patient Identification Verified: Yes Signs or symptoms of abuse/neglect since last visito No Secondary Verification Process Completed: Yes Hospitalized since last visit: No Patient Has Alerts: Yes Implantable device outside of the clinic excluding No Patient Alerts: Patient on Blood Thinner cellular tissue based products placed in the center ABI: R 0.94 01/07/21 since last visit: Has Dressing in Place as Prescribed: Yes Has Compression in Place as Prescribed: Yes Pain Present Now: No Electronic Signature(s) Signed: 08/26/2021 5:31:46 PM By: Rhae Hammock RN Entered By: Rhae Hammock on 08/26/2021 11:13:56 -------------------------------------------------------------------------------- Clinic Level of Care Assessment Details Patient Name: Date of Service: AZIAH, KAISER 08/26/2021 10:45 A M Medical Record Number: [id number] Patient Account Number: [id number] Date of Birth/Sex: Treating RN: 1945/08/08 (76 y.o. [name], [name] Primary Care Gerod Caligiuri: [name] Other Clinician: Referring [name]: Treating [name]: [name] in Treatment: 2 Clinic Level of Care Assessment  Items TOOL 4 Quantity Score X- 1 0 Use when only an EandM is performed on FOLLOW-UP visit ASSESSMENTS - Nursing Assessment / Reassessment X- 1 10 Reassessment of Co-morbidities (includes updates in patient status) X- 1 5 Reassessment of Adherence to Treatment Plan ASSESSMENTS - Wound and Skin A ssessment / Reassessment X - Simple Wound Assessment / Reassessment - one wound 1 5 []  - 0 Complex Wound Assessment / Reassessment - multiple wounds []  - 0 Dermatologic / Skin Assessment (not related to wound area) ASSESSMENTS - Focused Assessment X- 2 5 Circumferential Edema Measurements - multi extremities []  - 0 Nutritional Assessment / Counseling / Intervention []  - 0 Lower Extremity Assessment (monofilament, tuning fork, pulses) []  - 0 Peripheral Arterial Disease Assessment (using hand held doppler) ASSESSMENTS - Ostomy and/or Continence Assessment and Care []  - 0 Incontinence Assessment and Management []  - 0 Ostomy Care Assessment and Management (repouching, etc.) PROCESS - Coordination of Care X - Simple Patient / Family Education for ongoing care 1 15 []  - 0 Complex (extensive) Patient / Family Education for ongoing care X- 1 10 Staff obtains Programmer, systems, Records, T Results / Process Orders est []  - 0 Staff telephones HHA, Nursing Homes / Clarify orders / etc []  - 0 Routine Transfer to another Facility (non-emergent condition) []  - 0 Routine Hospital Admission (non-emergent condition) []  - 0 New Admissions / Biomedical engineer / Ordering NPWT Apligraf, etc. , []  - 0 Emergency Hospital Admission (emergent condition) X- 1 10 Simple Discharge Coordination []  - 0 Complex (extensive) Discharge Coordination PROCESS - Special Needs []  - 0 Pediatric / Minor Patient Management []  - 0 Isolation Patient Management []  - 0 Hearing / Language / Visual special needs []  - 0 Assessment of Community assistance (transportation, D/C planning, etc.) []  - 0 Additional  assistance / Altered mentation []  - 0 Support Surface(s) Assessment (bed, cushion, seat, etc.) INTERVENTIONS - Wound Cleansing / Measurement X - Simple Wound  Cleansing - one wound 1 5 []  - 0 Complex Wound Cleansing - multiple wounds X- 1 5 Wound Imaging (photographs - any number of wounds) []  - 0 Wound Tracing (instead of photographs) X- 1 5 Simple Wound Measurement - one wound []  - 0 Complex Wound Measurement - multiple wounds INTERVENTIONS - Wound Dressings X - Small Wound Dressing one or multiple wounds 1 10 []  - 0 Medium Wound Dressing one or multiple wounds []  - 0 Large Wound Dressing one or multiple wounds []  - 0 Application of Medications - topical []  - 0 Application of Medications - injection INTERVENTIONS - Miscellaneous []  - 0 External ear exam []  - 0 Specimen Collection (cultures, biopsies, blood, body fluids, etc.) []  - 0 Specimen(s) / Culture(s) sent or taken to Lab for analysis []  - 0 Patient Transfer (multiple staff / Civil Service fast streamer / Similar devices) []  - 0 Simple Staple / Suture removal (25 or less) []  - 0 Complex Staple / Suture removal (26 or more) []  - 0 Hypo / Hyperglycemic Management (close monitor of Blood Glucose) []  - 0 Ankle / Brachial Index (ABI) - do not check if billed separately X- 1 5 Vital Signs Has the patient been seen at the hospital within the last three years: Yes Total Score: 95 Level Of Care: New/Established - Level 3 Electronic Signature(s) Signed: 08/26/2021 5:31:46 PM By: Rhae Hammock RN Entered By: Rhae Hammock on 08/26/2021 11:52:26 -------------------------------------------------------------------------------- Encounter Discharge Information Details Patient Name: Date of Service: [name] 08/26/2021 10:45 A M Medical Record Number: [id number] Patient Account Number: [id number] Date of Birth/Sex: Treating RN: 08/12/1945 (76 y.o. [name], [name] Primary Care Jorgia Manthei: [name] Other  Clinician: Referring [name]: Treating [name]: [name] in Treatment: 2 Encounter Discharge Information Items Discharge Condition: Stable Ambulatory Status: Wheelchair Discharge Destination: Home Transportation: Private Auto Accompanied By: son Schedule Follow-up Appointment: Yes Clinical Summary of Care: Patient Declined Electronic Signature(s) Signed: 08/26/2021 5:31:46 PM By: Rhae Hammock RN Entered By: Rhae Hammock on 08/26/2021 11:55:27 -------------------------------------------------------------------------------- Lower Extremity Assessment Details Patient Name: Date of Service: Melissa, Cameron 08/26/2021 10:45 A M Medical Record Number: [id number] Patient Account Number: [id number] Date of Birth/Sex: Treating RN: September 26, 1945 (76 y.o. [name], [name] Primary Care Kamori Kitchens: [name] Other Clinician: Referring Tyton Abdallah: Treating Antanette Richwine/Extender: [name] in Treatment: 2 Edema Assessment Assessed: [Left: No] [Right: Yes] Edema: [Left: Ye] [Right: s] Calf Left: Right: Point of Measurement: From Medial Instep 36.5 cm Ankle Left: Right: Point of Measurement: From Medial Instep 24.8 cm Vascular Assessment Pulses: Dorsalis Pedis Palpable: [Right:Yes] Posterior Tibial Palpable: [Right:Yes] Electronic Signature(s) Signed: 08/26/2021 5:31:46 PM By: Rhae Hammock RN Entered By: Rhae Hammock on 08/26/2021 11:14:28 -------------------------------------------------------------------------------- [place] Details Patient Name: Date of Service: [name] 08/26/2021 10:45 A M Medical Record Number: [id number] Patient Account Number: [id number] Date of Birth/Sex: Treating RN: 1945/10/27 (76 y.o. [name], [name] Primary Care Breton Berns: [name] Other Clinician: Referring [name]: Treating Starlina Lapre/Extender: [name] in Treatment:  2 Active Inactive Electronic Signature(s) Signed: 10/23/2021 10:24:24 AM By: Deon Pilling RN, BSN Signed: 04/02/2022 11:55:26 AM By: Rhae Hammock RN Previous Signature: 08/26/2021 5:31:46 PM Version By: Rhae Hammock RN Entered By: Deon Pilling on 10/23/2021 10:24:23 -------------------------------------------------------------------------------- Pain Assessment Details Patient Name: Date of Service: DIEDRE, MACLELLAN 08/26/2021 10:45 A M Medical Record Number: [id number] Patient Account Number: [id number] Date of Birth/Sex: Treating RN: Dec 11, 1945 (76 y.o. [name], [place] Primary Care Kanav Kazmierczak: [name]  Other Clinician: Referring Myking Sar: Treating Kit Brubacher/Extender: [name] in Treatment: 2 Active Problems Location of Pain Severity and Description of Pain Patient Has Paino No Site Locations Pain Management and Medication Current Pain Management: Electronic Signature(s) Signed: 08/26/2021 5:31:46 PM By: Rhae Hammock RN Entered By: Rhae Hammock on 08/26/2021 11:14:20 -------------------------------------------------------------------------------- Patient/Caregiver Education Details Patient Name: Date of Service: [name] 2/22/2023andnbsp10:45 [place] Record Number: [id number] Patient Account Number: [id number] Date of Birth/Gender: Treating RN: January 28, 1946 (76 y.o. [name], [name] Primary Care Physician: [name] Other Clinician: Referring Physician: Treating Physician/Extender: [name] in Treatment: 2 Education Assessment Education Provided To: Patient Education Topics Provided Wound/Skin Impairment: Methods: Explain/Verbal Responses: Reinforcements needed, State content correctly Electronic Signature(s) Signed: 08/26/2021 5:31:46 PM By: Rhae Hammock RN Entered By: Rhae Hammock on 08/26/2021  11:15:50 -------------------------------------------------------------------------------- Wound Assessment Details Patient Name: Date of Service: [name], Areil S. 08/26/2021 10:45 A M Medical Record Number: [id number] Patient Account Number: [id number] Date of Birth/Sex: Treating RN: March 21, 1946 (76 y.o. [name], [name] Primary Care Marquetta Weiskopf: [name] Other Clinician: Referring Benjy Kana: Treating Rodger Giangregorio/Extender: [name] in Treatment: 2 Wound Status Wound Number: 2 Primary Etiology: Pressure Ulcer Wound Location: Right, Posterior Lower Leg Wound Status: Open Wounding Event: Gradually Appeared Date Acquired: 07/29/2021 Weeks Of Treatment: 2 Clustered Wound: No Pending Amputation On Presentation Photos Photo Uploaded By: Donavan Burnet on 08/26/2021 11:41:39 Wound Measurements Length: (cm) Width: (cm) Depth: (cm) Area: (cm) Volume: (cm) 0 % Reduction in Area: 100% 0 % Reduction in Volume: 100% 0 0 0 Wound Description Classification: Category/Stage III Exudate Amount: Medium Exudate Type: Serosanguineous Exudate Color: red, brown Electronic Signature(s) Signed: 08/26/2021 5:31:46 PM By: Rhae Hammock RN Entered By: Rhae Hammock on 08/26/2021 11:14:43 -------------------------------------------------------------------------------- Vit[place] Details Patient Name: Date of Service: [name] 08/26/2021 10:45 A M Medical Record Number: [id number] Patient Account Number: [id number] Date of Birth/Sex: Treating RN: December 27, 1945 (76 y.o. [name], [name] Primary Care Breely Panik: [name] Other Clinician: Referring Meshawn Oconnor: Treating Florence Antonelli/Extender: [name] in Treatment: 2 Vital Signs Time Taken: 11:14 Temperature (F): 98.7 Height (in): 67 Pulse (bpm): 74 Weight (lbs): 152 Respiratory Rate (breaths/min): 17 Body Mass Index (BMI): 23.8 Blood Pressure (mmHg): 133/70 Capillary Blood  Glucose (mg/dl): 201 Reference Range: 80 - 120 mg / dl Electronic Signature(s) Signed: 08/26/2021 5:31:46 PM By: Rhae Hammock RN Entered By: Rhae Hammock on 08/26/2021 11:14:15

## 2021-09-25 DIAGNOSIS — I10 Essential (primary) hypertension: Secondary | ICD-10-CM | POA: Diagnosis not present

## 2021-09-25 DIAGNOSIS — F330 Major depressive disorder, recurrent, mild: Secondary | ICD-10-CM | POA: Diagnosis not present

## 2021-09-25 DIAGNOSIS — I639 Cerebral infarction, unspecified: Secondary | ICD-10-CM | POA: Diagnosis not present

## 2021-09-25 DIAGNOSIS — E1159 Type 2 diabetes mellitus with other circulatory complications: Secondary | ICD-10-CM | POA: Diagnosis not present

## 2021-09-25 DIAGNOSIS — E785 Hyperlipidemia, unspecified: Secondary | ICD-10-CM | POA: Diagnosis not present

## 2021-10-13 ENCOUNTER — Ambulatory Visit: Payer: BLUE CROSS/BLUE SHIELD | Admitting: Internal Medicine

## 2021-10-26 DIAGNOSIS — I639 Cerebral infarction, unspecified: Secondary | ICD-10-CM | POA: Diagnosis not present

## 2021-10-26 DIAGNOSIS — I10 Essential (primary) hypertension: Secondary | ICD-10-CM | POA: Diagnosis not present

## 2021-10-26 DIAGNOSIS — E785 Hyperlipidemia, unspecified: Secondary | ICD-10-CM | POA: Diagnosis not present

## 2021-10-26 DIAGNOSIS — E1159 Type 2 diabetes mellitus with other circulatory complications: Secondary | ICD-10-CM | POA: Diagnosis not present

## 2021-10-26 DIAGNOSIS — F330 Major depressive disorder, recurrent, mild: Secondary | ICD-10-CM | POA: Diagnosis not present

## 2022-02-01 ENCOUNTER — Ambulatory Visit: Payer: Medicare PPO | Admitting: Internal Medicine

## 2022-02-10 ENCOUNTER — Encounter: Payer: Self-pay | Admitting: Internal Medicine

## 2022-02-10 ENCOUNTER — Ambulatory Visit: Payer: Medicare PPO

## 2022-02-10 ENCOUNTER — Ambulatory Visit (INDEPENDENT_AMBULATORY_CARE_PROVIDER_SITE_OTHER): Payer: Medicare PPO | Admitting: Internal Medicine

## 2022-02-10 VITALS — BP 122/68 | HR 83 | Temp 98.4°F | Ht 66.0 in

## 2022-02-10 DIAGNOSIS — E559 Vitamin D deficiency, unspecified: Secondary | ICD-10-CM | POA: Diagnosis not present

## 2022-02-10 DIAGNOSIS — Z8673 Personal history of transient ischemic attack (TIA), and cerebral infarction without residual deficits: Secondary | ICD-10-CM

## 2022-02-10 DIAGNOSIS — E1159 Type 2 diabetes mellitus with other circulatory complications: Secondary | ICD-10-CM

## 2022-02-10 DIAGNOSIS — I10 Essential (primary) hypertension: Secondary | ICD-10-CM | POA: Diagnosis not present

## 2022-02-10 DIAGNOSIS — E7800 Pure hypercholesterolemia, unspecified: Secondary | ICD-10-CM

## 2022-02-10 DIAGNOSIS — E538 Deficiency of other specified B group vitamins: Secondary | ICD-10-CM | POA: Diagnosis not present

## 2022-02-10 LAB — LIPID PANEL
Cholesterol: 130 mg/dL (ref 0–200)
HDL: 44.6 mg/dL (ref >39.00)
LDL Cholesterol: 55 mg/dL (ref 0–99)
NonHDL: 85.47
Total CHOL/HDL Ratio: 3
Triglycerides: 152 mg/dL — ABNORMAL HIGH (ref 0.0–149.0)
VLDL: 30.4 mg/dL (ref 0.0–40.0)

## 2022-02-10 LAB — CBC WITH DIFFERENTIAL/PLATELET
Basophils Absolute: 0 10*3/uL (ref 0.0–0.1)
Basophils Relative: 0.5 % (ref 0.0–3.0)
Eosinophils Absolute: 0.1 10*3/uL (ref 0.0–0.7)
Eosinophils Relative: 1.5 % (ref 0.0–5.0)
HCT: 44.3 % (ref 36.0–46.0)
Hemoglobin: 14.3 g/dL (ref 12.0–15.0)
Lymphocytes Relative: 29 % (ref 12.0–46.0)
Lymphs Abs: 2.5 10*3/uL (ref 0.7–4.0)
MCHC: 32.3 g/dL (ref 30.0–36.0)
MCV: 87.2 fl (ref 78.0–100.0)
Monocytes Absolute: 0.7 10*3/uL (ref 0.1–1.0)
Monocytes Relative: 8.3 % (ref 3.0–12.0)
Neutro Abs: 5.1 10*3/uL (ref 1.4–7.7)
Neutrophils Relative %: 60.7 % (ref 43.0–77.0)
Platelets: 227 10*3/uL (ref 150.0–400.0)
RBC: 5.08 Mil/uL (ref 3.87–5.11)
RDW: 13.1 % (ref 11.5–15.5)
WBC: 8.5 10*3/uL (ref 4.0–10.5)

## 2022-02-10 LAB — BASIC METABOLIC PANEL
BUN: 11 mg/dL (ref 6–23)
CO2: 22 mEq/L (ref 19–32)
Calcium: 9.8 mg/dL (ref 8.4–10.5)
Chloride: 105 mEq/L (ref 96–112)
Creatinine, Ser: 0.79 mg/dL (ref 0.40–1.20)
GFR: 72.11 mL/min (ref >60.00)
Glucose, Bld: 318 mg/dL — ABNORMAL HIGH (ref 70–99)
Potassium: 4.2 mEq/L (ref 3.5–5.1)
Sodium: 142 mEq/L (ref 135–145)

## 2022-02-10 LAB — TSH: TSH: 2.08 u[IU]/mL (ref 0.35–5.50)

## 2022-02-10 LAB — HEPATIC FUNCTION PANEL
ALT: 11 U/L (ref 0–35)
AST: 13 U/L (ref 0–37)
Albumin: 4.2 g/dL (ref 3.5–5.2)
Alkaline Phosphatase: 99 U/L (ref 39–117)
Bilirubin, Direct: 0.1 mg/dL (ref 0.0–0.3)
Total Bilirubin: 0.3 mg/dL (ref 0.2–1.2)
Total Protein: 7.3 g/dL (ref 6.0–8.3)

## 2022-02-10 LAB — VITAMIN B12: Vitamin B-12: 164 pg/mL — ABNORMAL LOW (ref 211–911)

## 2022-02-10 LAB — VITAMIN D 25 HYDROXY (VIT D DEFICIENCY, FRACTURES): VITD: <7 ng/mL — ABNORMAL LOW (ref 30.00–100.00)

## 2022-02-10 LAB — HEMOGLOBIN A1C: Hgb A1c MFr Bld: 10.1 % — ABNORMAL HIGH (ref 4.6–6.5)

## 2022-02-10 NOTE — Assessment & Plan Note (Signed)
Lab Results  Component Value Date   LDLCALC 55 02/10/2022   Stable, pt to continue current statin lipitor 40 mg qd

## 2022-02-10 NOTE — Assessment & Plan Note (Signed)
  Stable byt hx, pt to continue current medical treatment levemir 50 and metformine 750 qd, for f/u a1c

## 2022-02-10 NOTE — Progress Notes (Signed)
Patient ID: [name], female   DOB: 12/21/1945, 76 y.o.   MRN: [id number]        Chief Complaint: follow up dm, low vit D and b12, hx stroke       HPI:  [name] is a 76 y.o. female here with caretaker, states overall doing well, asking for PT at home due to worsening mobility and generalized weakness, more off balance.  Pt denies chest pain, increased sob or doe, wheezing, orthopnea, PND, increased LE swelling, palpitations, dizziness or syncope.   Pt denies polydipsia, polyuria, or new focal neuro s/s.    Pt denies fever, wt loss, night sweats, loss of appetite, or other constitutional symptoms  Not taking B12 or Vit D.         Wt Readings from Last 3 Encounters:  07/27/21 154 lb (69.9 kg)  04/10/20 182 lb 15.7 oz (83 kg)  02/06/19 152 lb (68.9 kg)   BP Readings from Last 3 Encounters:  02/10/22 122/68  07/27/21 120/74  04/13/21 120/78         Past Medical History:  Diagnosis Date   Acute blood loss anemia    CHF (congestive heart failure) (HCC)    EF 26-71%, grade 1 diastolic dysfunction per echo 04/2015   Coronary artery disease involving native artery of transplanted heart without angina pectoris    CVA (cerebral infarction) 04/2015   Started Plavix 04/2015   Depression    Diabetes ([place]) 2016   Type II. On insulin   Enchondroma of bone 2011   left femur.    Fracture, intertrochanteric, right femur ([place])    History of lower GI bleeding    Hyperlipidemia    Hypertension    Patent foramen ovale 04/2015   Started on warfarin 04/2015   Protein calorie malnutrition ([place])    Stercoral ulcer of rectum 05/16/2015   Past Surgical History:  Procedure Laterality Date   CARDIAC SURGERY     Cath without stent   COLONOSCOPY N/A 05/15/2015   Procedure: COLONOSCOPY;  Surgeon: Mauri Pole, MD;  Location: [facility];  Service: Endoscopy;  Laterality: N/A;   FLEXIBLE SIGMOIDOSCOPY N/A 05/15/2015   Procedure: FLEXIBLE SIGMOIDOSCOPY;  Surgeon: Mauri Pole,  MD;  Location: [facility] ENDOSCOPY;  Service: Endoscopy;  Laterality: N/A;  at bedside   INTRAMEDULLARY (IM) NAIL INTERTROCHANTERIC Right 06/12/2015   Procedure: INTRAMEDULLARY (IM) NAIL RIGHT HIP;  Surgeon: Renette Butters, MD;  Location: [facility];  Service: Orthopedics;  Laterality: Right;   TEE WITHOUT CARDIOVERSION N/A 05/05/2015   Procedure: TRANSESOPHAGEAL ECHOCARDIOGRAM (TEE);  Surgeon: Dixie Dials, MD;  Location[facility] END[facility]COPY;  Service: Cardiovascular;  Laterality: N/A;    reports that she quit smoking about 6 years ago. Her smoking use included cigarettes. She has never used smokeless tobacco. She reports that she does not drink alcohol and does not use drugs. family history includes Heart attack in her father; Heart failure in her mother; Hyperlipidemia in her mother; Hypertension in her mother. Allergies  Allergen Reactions   Pork-Derived Products Other (See Comments)    To keep blood pressure down   Current Outpatient Medications on File Prior to Visit  Medication Sig Dispense Refill   atorvastatin (LIPITOR) 40 MG tablet Take 0.5 tablets (20 mg total) by mouth daily.     ELIQUIS 5 MG TABS tablet Take 5 mg by mouth 2 (two) times daily.      insulin detemir (LEVEMIR) 100 UNIT/ML injection INJECT 50 UNITS SUBCUTANEOUSLY ONCE DAILY 10 mL  3   Insulin Syringe-Needle U-100 (MM INSULIN SYRINGE/NEEDLE) 31G X 5/16" 1 ML MISC Use as directed four times per day E11.9 400 each 3   Lancets MISC Use as directed four times per day E11.9 400 each 11   levETIRAcetam (KEPPRA) 500 MG tablet Take 1 tablet (500 mg total) by mouth 2 (two) times daily. 180 tablet 3   losartan (COZAAR) 25 MG tablet Take 12.5 mg by mouth daily.      metFORMIN (GLUCOPHAGE-XR) 750 MG 24 hr tablet Take 1 tablet by mouth once daily with breakfast 90 tablet 1   metoprolol succinate (TOPROL-XL) 25 MG 24 hr tablet Take 25 mg by mouth daily.      traMADol (ULTRAM) 50 MG tablet Take 1 tablet (50 mg total) by mouth every 6 (six) hours as  needed for moderate pain. 30 tablet 2   No current facility-administered medications on file prior to visit.        ROS:  All others reviewed and negative.  Objective        PE:  BP 122/68 (BP Location: Left Arm, Patient Position: Sitting, Cuff Size: Normal)   Pulse 83   Temp 98.4 F (36.9 C) (Oral)   Ht '5\' 6"'$  (1.676 m)   SpO2 97%   BMI 24.86 kg/m                 Constitutional: Pt appears in NAD               HENT: Head: NCAT.                Right Ear: External ear normal.                 Left Ear: External ear normal.                Eyes: . Pupils are equal, round, and reactive to light. Conjunctivae and EOM are normal               Nose: without d/c or deformity               Neck: Neck supple. Gross normal ROM               Cardiovascular: Normal rate and regular rhythm.                 Pulmonary/Chest: Effort normal and breath sounds without rales or wheezing.                Abd:  Soft, NT, ND, + BS, no organomegaly               Neurological: Pt is alert. At baseline orientation, motor grossly intact with right hemiparesis chronic               Skin: Skin is warm. No rashes, no other new lesions, LE edema - none               Psychiatric: Pt behavior is normal without agitation   Micro: none  Cardiac tracings I have personally interpreted today:  none  Pertinent Radiological findings (summarize): none   Lab Results  Component Value Date   WBC 8.5 02/10/2022   HGB 14.3 02/10/2022   HCT 44.3 02/10/2022   PLT 227.0 02/10/2022   GLUCOSE 318 (H) 02/10/2022   CHOL 130 02/10/2022   TRIG 152.0 (H) 02/10/2022   HDL 44.60 02/10/2022   LDLDIRECT 59.0 03/20/2020   LDLCALC 55 02/10/2022  ALT 11 02/10/2022   AST 13 02/10/2022   NA 142 02/10/2022   K 4.2 02/10/2022   CL 105 02/10/2022   CREATININE 0.79 02/10/2022   BUN 11 02/10/2022   CO2 22 02/10/2022   TSH 2.08 02/10/2022   INR 1.2 04/06/2020   HGBA1C 10.1 (H) 02/10/2022   MICROALBUR 2.3 (H) 11/04/2017    Assessment/Plan:  ALDA GAULTNEY is a 76 y.o. 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  Vitamin D deficiency Last vitamin D Lab Results  Component Value Date   VD25OH 9.96 (L) 07/27/2021   Low, to start oral replacement   History of stroke Ok for [facility] with PT OT , and aide  Diabetes ([place])  Stable byt hx, pt to continue current medical treatment levemir 50 and metformine 750 qd, for f/u a1c   Hypertension BP Readings from Last 3 Encounters:  02/10/22 122/68  07/27/21 120/74  04/13/21 120/78   Stable, pt to continue medical treatment losartan 25 mg qd, toprol xl 25 mg qd   HLD (hyperlipidemia) Lab Results  Component Value Date   LDLCALC 55 02/10/2022   Stable, pt to continue current statin lipitor 40 mg qd   B12 deficiency Lab Results  Component Value Date   VITAMINB12 164 (L) 02/10/2022   uncontrolled, to start oral replacement - b12 1000  Mcg po qd  Followup: Return in about 6 months (around 08/13/2022).  Cathlean Cower, MD 02/10/2022 9:29 PM [facility]

## 2022-02-10 NOTE — Patient Instructions (Signed)
Please continue all other medications as before, and refills have been done if requested.  Please have the pharmacy call with any other refills you may need.  Please continue your efforts at being more active, low cholesterol diet, and weight control.  Please keep your appointments with your specialists as you may have planned  You will be contacted regarding the referral for: [facility] with PT/OT and Aide  Please go to the LAB at the blood drawing area for the tests to be done  You will be contacted by phone if any changes need to be made immediately.  Otherwise, you will receive a letter about your results with an explanation, but please check with MyChart first.  Please remember to sign up for MyChart if you have not done so, as this will be important to you in the future with finding out test results, communicating by private email, and scheduling acute appointments online when needed.  Please make an Appointment to return in 6 months, or sooner if needed

## 2022-02-10 NOTE — Assessment & Plan Note (Signed)
Lab Results  Component Value Date   VITAMINB12 164 (L) 02/10/2022   uncontrolled, to start oral replacement - b12 1000  Mcg po qd

## 2022-02-10 NOTE — Assessment & Plan Note (Signed)
Last vitamin D Lab Results  Component Value Date   VD25OH 9.96 (L) 07/27/2021   Low, to start oral replacement

## 2022-02-10 NOTE — Assessment & Plan Note (Signed)
BP Readings from Last 3 Encounters:  02/10/22 122/68  07/27/21 120/74  04/13/21 120/78   Stable, pt to continue medical treatment losartan 25 mg qd, toprol xl 25 mg qd

## 2022-02-10 NOTE — Assessment & Plan Note (Signed)
[place] for [facility] with PT OT , and aide

## 2022-02-11 ENCOUNTER — Ambulatory Visit: Payer: Medicare PPO

## 2022-02-11 ENCOUNTER — Telehealth: Payer: Self-pay

## 2022-02-11 NOTE — Telephone Encounter (Signed)
Patient returned call to office.  I called patient again and left another message to call my direct number instead of office line.

## 2022-02-11 NOTE — Telephone Encounter (Signed)
Patient did not return call from second message; Please reschedule AWV if she calls back to the office.

## 2022-02-11 NOTE — Progress Notes (Signed)
No charge; Patient did not complete AWV on this date.

## 2022-02-11 NOTE — Telephone Encounter (Signed)
Patient scheduled for AWV-S via phone for today at 4:00 pm. Left v-mail for patient to return call to [phone].

## 2022-02-15 ENCOUNTER — Other Ambulatory Visit: Payer: Self-pay | Admitting: Internal Medicine

## 2022-02-15 MED ORDER — METFORMIN HCL ER 750 MG PO TB24
750.0000 mg | ORAL_TABLET | Freq: Every day | ORAL | 3 refills | Status: DC
Start: 2022-02-15 — End: 2024-01-19

## 2022-02-18 ENCOUNTER — Other Ambulatory Visit: Payer: Self-pay | Admitting: Internal Medicine

## 2022-02-18 ENCOUNTER — Other Ambulatory Visit: Payer: Self-pay

## 2022-02-18 MED ORDER — INSULIN DETEMIR 100 UNIT/ML ~~LOC~~ SOLN: SUBCUTANEOUS | 3 refills | Status: DC

## 2022-02-18 NOTE — Telephone Encounter (Signed)
Please refill as per office routine med refill policy (all routine meds to be refilled for 3 mo or monthly (per pt preference) up to one year from last visit, then month to month grace period for 3 mo, then further med refills will have to be denied) ? ?

## 2022-02-19 ENCOUNTER — Encounter: Payer: Self-pay | Admitting: Internal Medicine

## 2022-03-05 DIAGNOSIS — I5033 Acute on chronic diastolic (congestive) heart failure: Secondary | ICD-10-CM | POA: Diagnosis not present

## 2022-03-05 DIAGNOSIS — G40201 Localization-related (focal) (partial) symptomatic epilepsy and epileptic syndromes with complex partial seizures, not intractable, with status epilepticus: Secondary | ICD-10-CM | POA: Diagnosis not present

## 2022-03-05 DIAGNOSIS — Z8673 Personal history of transient ischemic attack (TIA), and cerebral infarction without residual deficits: Secondary | ICD-10-CM | POA: Diagnosis not present

## 2022-03-05 DIAGNOSIS — E441 Mild protein-calorie malnutrition: Secondary | ICD-10-CM | POA: Diagnosis not present

## 2022-03-05 DIAGNOSIS — I69951 Hemiplegia and hemiparesis following unspecified cerebrovascular disease affecting right dominant side: Secondary | ICD-10-CM | POA: Diagnosis not present

## 2022-03-05 DIAGNOSIS — I10 Essential (primary) hypertension: Secondary | ICD-10-CM | POA: Diagnosis not present

## 2022-03-05 DIAGNOSIS — E1149 Type 2 diabetes mellitus with other diabetic neurological complication: Secondary | ICD-10-CM | POA: Diagnosis not present

## 2022-03-08 DIAGNOSIS — E441 Mild protein-calorie malnutrition: Secondary | ICD-10-CM | POA: Diagnosis not present

## 2022-03-08 DIAGNOSIS — G40201 Localization-related (focal) (partial) symptomatic epilepsy and epileptic syndromes with complex partial seizures, not intractable, with status epilepticus: Secondary | ICD-10-CM | POA: Diagnosis not present

## 2022-03-08 DIAGNOSIS — E1149 Type 2 diabetes mellitus with other diabetic neurological complication: Secondary | ICD-10-CM | POA: Diagnosis not present

## 2022-03-08 DIAGNOSIS — I5033 Acute on chronic diastolic (congestive) heart failure: Secondary | ICD-10-CM | POA: Diagnosis not present

## 2022-03-08 DIAGNOSIS — I10 Essential (primary) hypertension: Secondary | ICD-10-CM | POA: Diagnosis not present

## 2022-03-08 DIAGNOSIS — I69951 Hemiplegia and hemiparesis following unspecified cerebrovascular disease affecting right dominant side: Secondary | ICD-10-CM | POA: Diagnosis not present

## 2022-03-08 DIAGNOSIS — Z8673 Personal history of transient ischemic attack (TIA), and cerebral infarction without residual deficits: Secondary | ICD-10-CM | POA: Diagnosis not present

## 2022-03-09 DIAGNOSIS — E441 Mild protein-calorie malnutrition: Secondary | ICD-10-CM | POA: Diagnosis not present

## 2022-03-09 DIAGNOSIS — L89892 Pressure ulcer of other site, stage 2: Secondary | ICD-10-CM | POA: Diagnosis not present

## 2022-03-09 DIAGNOSIS — I10 Essential (primary) hypertension: Secondary | ICD-10-CM | POA: Diagnosis not present

## 2022-03-09 DIAGNOSIS — I5022 Chronic systolic (congestive) heart failure: Secondary | ICD-10-CM | POA: Diagnosis not present

## 2022-03-09 DIAGNOSIS — E1149 Type 2 diabetes mellitus with other diabetic neurological complication: Secondary | ICD-10-CM | POA: Diagnosis not present

## 2022-03-09 DIAGNOSIS — G40201 Localization-related (focal) (partial) symptomatic epilepsy and epileptic syndromes with complex partial seizures, not intractable, with status epilepticus: Secondary | ICD-10-CM | POA: Diagnosis not present

## 2022-03-09 DIAGNOSIS — I69951 Hemiplegia and hemiparesis following unspecified cerebrovascular disease affecting right dominant side: Secondary | ICD-10-CM | POA: Diagnosis not present

## 2022-03-10 DIAGNOSIS — M6281 Muscle weakness (generalized): Secondary | ICD-10-CM | POA: Diagnosis not present

## 2022-03-10 DIAGNOSIS — Z8673 Personal history of transient ischemic attack (TIA), and cerebral infarction without residual deficits: Secondary | ICD-10-CM | POA: Diagnosis not present

## 2022-03-10 DIAGNOSIS — E1149 Type 2 diabetes mellitus with other diabetic neurological complication: Secondary | ICD-10-CM | POA: Diagnosis not present

## 2022-03-10 DIAGNOSIS — I10 Essential (primary) hypertension: Secondary | ICD-10-CM | POA: Diagnosis not present

## 2022-03-10 DIAGNOSIS — I69951 Hemiplegia and hemiparesis following unspecified cerebrovascular disease affecting right dominant side: Secondary | ICD-10-CM | POA: Diagnosis not present

## 2022-03-10 DIAGNOSIS — I5033 Acute on chronic diastolic (congestive) heart failure: Secondary | ICD-10-CM | POA: Diagnosis not present

## 2022-03-10 DIAGNOSIS — G40201 Localization-related (focal) (partial) symptomatic epilepsy and epileptic syndromes with complex partial seizures, not intractable, with status epilepticus: Secondary | ICD-10-CM | POA: Diagnosis not present

## 2022-03-10 DIAGNOSIS — E441 Mild protein-calorie malnutrition: Secondary | ICD-10-CM | POA: Diagnosis not present

## 2022-03-10 DIAGNOSIS — R278 Other lack of coordination: Secondary | ICD-10-CM | POA: Diagnosis not present

## 2022-03-10 DIAGNOSIS — I639 Cerebral infarction, unspecified: Secondary | ICD-10-CM | POA: Diagnosis not present

## 2022-03-10 DIAGNOSIS — R1312 Dysphagia, oropharyngeal phase: Secondary | ICD-10-CM | POA: Diagnosis not present

## 2022-03-11 DIAGNOSIS — M6281 Muscle weakness (generalized): Secondary | ICD-10-CM | POA: Diagnosis not present

## 2022-03-11 DIAGNOSIS — I639 Cerebral infarction, unspecified: Secondary | ICD-10-CM | POA: Diagnosis not present

## 2022-03-11 DIAGNOSIS — R278 Other lack of coordination: Secondary | ICD-10-CM | POA: Diagnosis not present

## 2022-03-11 DIAGNOSIS — R1312 Dysphagia, oropharyngeal phase: Secondary | ICD-10-CM | POA: Diagnosis not present

## 2022-03-12 DIAGNOSIS — Z8673 Personal history of transient ischemic attack (TIA), and cerebral infarction without residual deficits: Secondary | ICD-10-CM | POA: Diagnosis not present

## 2022-03-12 DIAGNOSIS — R278 Other lack of coordination: Secondary | ICD-10-CM | POA: Diagnosis not present

## 2022-03-12 DIAGNOSIS — G40201 Localization-related (focal) (partial) symptomatic epilepsy and epileptic syndromes with complex partial seizures, not intractable, with status epilepticus: Secondary | ICD-10-CM | POA: Diagnosis not present

## 2022-03-12 DIAGNOSIS — I69951 Hemiplegia and hemiparesis following unspecified cerebrovascular disease affecting right dominant side: Secondary | ICD-10-CM | POA: Diagnosis not present

## 2022-03-12 DIAGNOSIS — I10 Essential (primary) hypertension: Secondary | ICD-10-CM | POA: Diagnosis not present

## 2022-03-12 DIAGNOSIS — E1149 Type 2 diabetes mellitus with other diabetic neurological complication: Secondary | ICD-10-CM | POA: Diagnosis not present

## 2022-03-12 DIAGNOSIS — E441 Mild protein-calorie malnutrition: Secondary | ICD-10-CM | POA: Diagnosis not present

## 2022-03-12 DIAGNOSIS — I5033 Acute on chronic diastolic (congestive) heart failure: Secondary | ICD-10-CM | POA: Diagnosis not present

## 2022-03-12 DIAGNOSIS — M6281 Muscle weakness (generalized): Secondary | ICD-10-CM | POA: Diagnosis not present

## 2022-03-12 DIAGNOSIS — R1312 Dysphagia, oropharyngeal phase: Secondary | ICD-10-CM | POA: Diagnosis not present

## 2022-03-12 DIAGNOSIS — I639 Cerebral infarction, unspecified: Secondary | ICD-10-CM | POA: Diagnosis not present

## 2022-03-15 DIAGNOSIS — E1149 Type 2 diabetes mellitus with other diabetic neurological complication: Secondary | ICD-10-CM | POA: Diagnosis not present

## 2022-03-15 DIAGNOSIS — R1312 Dysphagia, oropharyngeal phase: Secondary | ICD-10-CM | POA: Diagnosis not present

## 2022-03-15 DIAGNOSIS — I10 Essential (primary) hypertension: Secondary | ICD-10-CM | POA: Diagnosis not present

## 2022-03-15 DIAGNOSIS — I69951 Hemiplegia and hemiparesis following unspecified cerebrovascular disease affecting right dominant side: Secondary | ICD-10-CM | POA: Diagnosis not present

## 2022-03-15 DIAGNOSIS — I5033 Acute on chronic diastolic (congestive) heart failure: Secondary | ICD-10-CM | POA: Diagnosis not present

## 2022-03-15 DIAGNOSIS — R278 Other lack of coordination: Secondary | ICD-10-CM | POA: Diagnosis not present

## 2022-03-15 DIAGNOSIS — I639 Cerebral infarction, unspecified: Secondary | ICD-10-CM | POA: Diagnosis not present

## 2022-03-15 DIAGNOSIS — Z8673 Personal history of transient ischemic attack (TIA), and cerebral infarction without residual deficits: Secondary | ICD-10-CM | POA: Diagnosis not present

## 2022-03-15 DIAGNOSIS — G40201 Localization-related (focal) (partial) symptomatic epilepsy and epileptic syndromes with complex partial seizures, not intractable, with status epilepticus: Secondary | ICD-10-CM | POA: Diagnosis not present

## 2022-03-15 DIAGNOSIS — M6281 Muscle weakness (generalized): Secondary | ICD-10-CM | POA: Diagnosis not present

## 2022-03-15 DIAGNOSIS — E441 Mild protein-calorie malnutrition: Secondary | ICD-10-CM | POA: Diagnosis not present

## 2022-03-16 ENCOUNTER — Telehealth: Payer: Self-pay | Admitting: Internal Medicine

## 2022-03-16 DIAGNOSIS — I639 Cerebral infarction, unspecified: Secondary | ICD-10-CM | POA: Diagnosis not present

## 2022-03-16 DIAGNOSIS — R278 Other lack of coordination: Secondary | ICD-10-CM | POA: Diagnosis not present

## 2022-03-16 DIAGNOSIS — L89892 Pressure ulcer of other site, stage 2: Secondary | ICD-10-CM | POA: Diagnosis not present

## 2022-03-16 DIAGNOSIS — R1312 Dysphagia, oropharyngeal phase: Secondary | ICD-10-CM | POA: Diagnosis not present

## 2022-03-16 DIAGNOSIS — M6281 Muscle weakness (generalized): Secondary | ICD-10-CM | POA: Diagnosis not present

## 2022-03-16 NOTE — Telephone Encounter (Signed)
LVM for pt to rtn my call to schedule AWV with [facility] call back # [phone] 

## 2022-03-17 DIAGNOSIS — M6281 Muscle weakness (generalized): Secondary | ICD-10-CM | POA: Diagnosis not present

## 2022-03-17 DIAGNOSIS — R278 Other lack of coordination: Secondary | ICD-10-CM | POA: Diagnosis not present

## 2022-03-17 DIAGNOSIS — I639 Cerebral infarction, unspecified: Secondary | ICD-10-CM | POA: Diagnosis not present

## 2022-03-17 DIAGNOSIS — R1312 Dysphagia, oropharyngeal phase: Secondary | ICD-10-CM | POA: Diagnosis not present

## 2022-03-18 DIAGNOSIS — R278 Other lack of coordination: Secondary | ICD-10-CM | POA: Diagnosis not present

## 2022-03-18 DIAGNOSIS — I639 Cerebral infarction, unspecified: Secondary | ICD-10-CM | POA: Diagnosis not present

## 2022-03-18 DIAGNOSIS — R1312 Dysphagia, oropharyngeal phase: Secondary | ICD-10-CM | POA: Diagnosis not present

## 2022-03-18 DIAGNOSIS — M6281 Muscle weakness (generalized): Secondary | ICD-10-CM | POA: Diagnosis not present

## 2022-03-19 DIAGNOSIS — M6281 Muscle weakness (generalized): Secondary | ICD-10-CM | POA: Diagnosis not present

## 2022-03-19 DIAGNOSIS — Z8673 Personal history of transient ischemic attack (TIA), and cerebral infarction without residual deficits: Secondary | ICD-10-CM | POA: Diagnosis not present

## 2022-03-19 DIAGNOSIS — G40201 Localization-related (focal) (partial) symptomatic epilepsy and epileptic syndromes with complex partial seizures, not intractable, with status epilepticus: Secondary | ICD-10-CM | POA: Diagnosis not present

## 2022-03-19 DIAGNOSIS — I5033 Acute on chronic diastolic (congestive) heart failure: Secondary | ICD-10-CM | POA: Diagnosis not present

## 2022-03-19 DIAGNOSIS — R1312 Dysphagia, oropharyngeal phase: Secondary | ICD-10-CM | POA: Diagnosis not present

## 2022-03-19 DIAGNOSIS — I639 Cerebral infarction, unspecified: Secondary | ICD-10-CM | POA: Diagnosis not present

## 2022-03-19 DIAGNOSIS — E1149 Type 2 diabetes mellitus with other diabetic neurological complication: Secondary | ICD-10-CM | POA: Diagnosis not present

## 2022-03-19 DIAGNOSIS — I69951 Hemiplegia and hemiparesis following unspecified cerebrovascular disease affecting right dominant side: Secondary | ICD-10-CM | POA: Diagnosis not present

## 2022-03-19 DIAGNOSIS — R278 Other lack of coordination: Secondary | ICD-10-CM | POA: Diagnosis not present

## 2022-03-19 DIAGNOSIS — E441 Mild protein-calorie malnutrition: Secondary | ICD-10-CM | POA: Diagnosis not present

## 2022-03-19 DIAGNOSIS — I10 Essential (primary) hypertension: Secondary | ICD-10-CM | POA: Diagnosis not present

## 2022-03-22 DIAGNOSIS — R1312 Dysphagia, oropharyngeal phase: Secondary | ICD-10-CM | POA: Diagnosis not present

## 2022-03-22 DIAGNOSIS — I639 Cerebral infarction, unspecified: Secondary | ICD-10-CM | POA: Diagnosis not present

## 2022-03-22 DIAGNOSIS — R278 Other lack of coordination: Secondary | ICD-10-CM | POA: Diagnosis not present

## 2022-03-22 DIAGNOSIS — M6281 Muscle weakness (generalized): Secondary | ICD-10-CM | POA: Diagnosis not present

## 2022-03-23 DIAGNOSIS — M6281 Muscle weakness (generalized): Secondary | ICD-10-CM | POA: Diagnosis not present

## 2022-03-23 DIAGNOSIS — R1312 Dysphagia, oropharyngeal phase: Secondary | ICD-10-CM | POA: Diagnosis not present

## 2022-03-23 DIAGNOSIS — I639 Cerebral infarction, unspecified: Secondary | ICD-10-CM | POA: Diagnosis not present

## 2022-03-23 DIAGNOSIS — R278 Other lack of coordination: Secondary | ICD-10-CM | POA: Diagnosis not present

## 2022-03-24 DIAGNOSIS — R278 Other lack of coordination: Secondary | ICD-10-CM | POA: Diagnosis not present

## 2022-03-24 DIAGNOSIS — M6281 Muscle weakness (generalized): Secondary | ICD-10-CM | POA: Diagnosis not present

## 2022-03-24 DIAGNOSIS — R1312 Dysphagia, oropharyngeal phase: Secondary | ICD-10-CM | POA: Diagnosis not present

## 2022-03-24 DIAGNOSIS — I639 Cerebral infarction, unspecified: Secondary | ICD-10-CM | POA: Diagnosis not present

## 2022-03-25 DIAGNOSIS — M6281 Muscle weakness (generalized): Secondary | ICD-10-CM | POA: Diagnosis not present

## 2022-03-25 DIAGNOSIS — I639 Cerebral infarction, unspecified: Secondary | ICD-10-CM | POA: Diagnosis not present

## 2022-03-25 DIAGNOSIS — R278 Other lack of coordination: Secondary | ICD-10-CM | POA: Diagnosis not present

## 2022-03-25 DIAGNOSIS — R1312 Dysphagia, oropharyngeal phase: Secondary | ICD-10-CM | POA: Diagnosis not present

## 2022-03-26 DIAGNOSIS — I639 Cerebral infarction, unspecified: Secondary | ICD-10-CM | POA: Diagnosis not present

## 2022-03-26 DIAGNOSIS — R1312 Dysphagia, oropharyngeal phase: Secondary | ICD-10-CM | POA: Diagnosis not present

## 2022-03-26 DIAGNOSIS — R278 Other lack of coordination: Secondary | ICD-10-CM | POA: Diagnosis not present

## 2022-03-26 DIAGNOSIS — M6281 Muscle weakness (generalized): Secondary | ICD-10-CM | POA: Diagnosis not present

## 2022-03-28 ENCOUNTER — Inpatient Hospital Stay (HOSPITAL_COMMUNITY)
Admission: EM | Admit: 2022-03-28 | Discharge: 2022-03-31 | DRG: 637 | Disposition: A | Discharge status: Skilled Nursing Facility | Payer: Medicare PPO | Source: Skilled Nursing Facility | Attending: Family Medicine | Admitting: Family Medicine

## 2022-03-28 ENCOUNTER — Emergency Department (HOSPITAL_COMMUNITY): Payer: Medicare PPO

## 2022-03-28 ENCOUNTER — Encounter (HOSPITAL_COMMUNITY): Payer: Self-pay | Admitting: Emergency Medicine

## 2022-03-28 ENCOUNTER — Other Ambulatory Visit: Payer: Self-pay

## 2022-03-28 DIAGNOSIS — D539 Nutritional anemia, unspecified: Secondary | ICD-10-CM | POA: Diagnosis present

## 2022-03-28 DIAGNOSIS — I2510 Atherosclerotic heart disease of native coronary artery without angina pectoris: Secondary | ICD-10-CM | POA: Diagnosis present

## 2022-03-28 DIAGNOSIS — E11649 Type 2 diabetes mellitus with hypoglycemia without coma: Principal | ICD-10-CM | POA: Diagnosis present

## 2022-03-28 DIAGNOSIS — E785 Hyperlipidemia, unspecified: Secondary | ICD-10-CM | POA: Diagnosis present

## 2022-03-28 DIAGNOSIS — N3000 Acute cystitis without hematuria: Secondary | ICD-10-CM | POA: Diagnosis not present

## 2022-03-28 DIAGNOSIS — Z7901 Long term (current) use of anticoagulants: Secondary | ICD-10-CM | POA: Diagnosis not present

## 2022-03-28 DIAGNOSIS — E162 Hypoglycemia, unspecified: Secondary | ICD-10-CM | POA: Diagnosis present

## 2022-03-28 DIAGNOSIS — R7989 Other specified abnormal findings of blood chemistry: Secondary | ICD-10-CM

## 2022-03-28 DIAGNOSIS — Z79899 Other long term (current) drug therapy: Secondary | ICD-10-CM | POA: Diagnosis not present

## 2022-03-28 DIAGNOSIS — R1312 Dysphagia, oropharyngeal phase: Secondary | ICD-10-CM | POA: Diagnosis not present

## 2022-03-28 DIAGNOSIS — Z87891 Personal history of nicotine dependence: Secondary | ICD-10-CM

## 2022-03-28 DIAGNOSIS — I509 Heart failure, unspecified: Secondary | ICD-10-CM

## 2022-03-28 DIAGNOSIS — Z8673 Personal history of transient ischemic attack (TIA), and cerebral infarction without residual deficits: Secondary | ICD-10-CM

## 2022-03-28 DIAGNOSIS — I10 Essential (primary) hypertension: Secondary | ICD-10-CM | POA: Diagnosis not present

## 2022-03-28 DIAGNOSIS — E161 Other hypoglycemia: Secondary | ICD-10-CM | POA: Diagnosis not present

## 2022-03-28 DIAGNOSIS — Z794 Long term (current) use of insulin: Secondary | ICD-10-CM

## 2022-03-28 DIAGNOSIS — Z993 Dependence on wheelchair: Secondary | ICD-10-CM | POA: Diagnosis not present

## 2022-03-28 DIAGNOSIS — I639 Cerebral infarction, unspecified: Secondary | ICD-10-CM | POA: Diagnosis not present

## 2022-03-28 DIAGNOSIS — I5032 Chronic diastolic (congestive) heart failure: Secondary | ICD-10-CM | POA: Diagnosis present

## 2022-03-28 DIAGNOSIS — Z7401 Bed confinement status: Secondary | ICD-10-CM

## 2022-03-28 DIAGNOSIS — E872 Acidosis, unspecified: Secondary | ICD-10-CM | POA: Diagnosis present

## 2022-03-28 DIAGNOSIS — Z8249 Family history of ischemic heart disease and other diseases of the circulatory system: Secondary | ICD-10-CM

## 2022-03-28 DIAGNOSIS — N390 Urinary tract infection, site not specified: Secondary | ICD-10-CM | POA: Diagnosis present

## 2022-03-28 DIAGNOSIS — I69351 Hemiplegia and hemiparesis following cerebral infarction affecting right dominant side: Secondary | ICD-10-CM | POA: Diagnosis not present

## 2022-03-28 DIAGNOSIS — R278 Other lack of coordination: Secondary | ICD-10-CM | POA: Diagnosis not present

## 2022-03-28 DIAGNOSIS — I110 Hypertensive heart disease with heart failure: Secondary | ICD-10-CM | POA: Diagnosis present

## 2022-03-28 DIAGNOSIS — Z7984 Long term (current) use of oral hypoglycemic drugs: Secondary | ICD-10-CM

## 2022-03-28 DIAGNOSIS — Z83438 Family history of other disorder of lipoprotein metabolism and other lipidemia: Secondary | ICD-10-CM | POA: Diagnosis not present

## 2022-03-28 DIAGNOSIS — G9341 Metabolic encephalopathy: Secondary | ICD-10-CM | POA: Diagnosis present

## 2022-03-28 DIAGNOSIS — J9811 Atelectasis: Secondary | ICD-10-CM | POA: Diagnosis not present

## 2022-03-28 DIAGNOSIS — R4182 Altered mental status, unspecified: Secondary | ICD-10-CM | POA: Diagnosis not present

## 2022-03-28 DIAGNOSIS — M6281 Muscle weakness (generalized): Secondary | ICD-10-CM | POA: Diagnosis not present

## 2022-03-28 DIAGNOSIS — G8111 Spastic hemiplegia affecting right dominant side: Secondary | ICD-10-CM | POA: Diagnosis not present

## 2022-03-28 LAB — PROTIME-INR
INR: 1.1 (ref 0.8–1.2)
Prothrombin Time: 14.3 seconds (ref 11.4–15.2)

## 2022-03-28 LAB — URINALYSIS, ROUTINE W REFLEX MICROSCOPIC
Bilirubin Urine: NEGATIVE
Glucose, UA: >=500 mg/dL — AB
Ketones, ur: NEGATIVE mg/dL
Nitrite: NEGATIVE
Protein, ur: NEGATIVE mg/dL
Specific Gravity, Urine: 1.01 (ref 1.005–1.030)
pH: 6 (ref 5.0–8.0)

## 2022-03-28 LAB — COMPREHENSIVE METABOLIC PANEL
ALT: 16 U/L (ref 0–44)
AST: 22 U/L (ref 15–41)
Albumin: 2.6 g/dL — ABNORMAL LOW (ref 3.5–5.0)
Alkaline Phosphatase: 63 U/L (ref 38–126)
Anion gap: 8 (ref 5–15)
BUN: 10 mg/dL (ref 8–23)
CO2: 22 mmol/L (ref 22–32)
Calcium: 8 mg/dL — ABNORMAL LOW (ref 8.9–10.3)
Chloride: 111 mmol/L (ref 98–111)
Creatinine, Ser: 0.77 mg/dL (ref 0.44–1.00)
GFR, Estimated: >60 mL/min (ref >60)
Glucose, Bld: 241 mg/dL — ABNORMAL HIGH (ref 70–99)
Potassium: 3.5 mmol/L (ref 3.5–5.1)
Sodium: 141 mmol/L (ref 135–145)
Total Bilirubin: 0.2 mg/dL — ABNORMAL LOW (ref 0.3–1.2)
Total Protein: 5.1 g/dL — ABNORMAL LOW (ref 6.5–8.1)

## 2022-03-28 LAB — URINALYSIS, MICROSCOPIC (REFLEX): WBC, UA: >50 WBC/hpf (ref 0–5)

## 2022-03-28 LAB — CBC WITH DIFFERENTIAL/PLATELET
Abs Immature Granulocytes: 0.03 10*3/uL (ref 0.00–0.07)
Basophils Absolute: 0 10*3/uL (ref 0.0–0.1)
Basophils Relative: 0 %
Eosinophils Absolute: 0.1 10*3/uL (ref 0.0–0.5)
Eosinophils Relative: 1 %
HCT: 40.2 % (ref 36.0–46.0)
Hemoglobin: 12.3 g/dL (ref 12.0–15.0)
Immature Granulocytes: 0 %
Lymphocytes Relative: 20 %
Lymphs Abs: 1.9 10*3/uL (ref 0.7–4.0)
MCH: 28.3 pg (ref 26.0–34.0)
MCHC: 30.6 g/dL (ref 30.0–36.0)
MCV: 92.4 fL (ref 80.0–100.0)
Monocytes Absolute: 0.6 10*3/uL (ref 0.1–1.0)
Monocytes Relative: 6 %
Neutro Abs: 7 10*3/uL (ref 1.7–7.7)
Neutrophils Relative %: 73 %
Platelets: 211 10*3/uL (ref 150–400)
RBC: 4.35 MIL/uL (ref 3.87–5.11)
RDW: 13.3 % (ref 11.5–15.5)
WBC: 9.6 10*3/uL (ref 4.0–10.5)
nRBC: 0 % (ref 0.0–0.2)

## 2022-03-28 LAB — LACTIC ACID, PLASMA
Lactic Acid, Venous: 3.4 mmol/L (ref 0.5–1.9)
Lactic Acid, Venous: 2.3 mmol/L (ref 0.5–1.9)

## 2022-03-28 LAB — CBG MONITORING, ED
Glucose-Capillary: 59 mg/dL — ABNORMAL LOW (ref 70–99)
Glucose-Capillary: 222 mg/dL — ABNORMAL HIGH (ref 70–99)
Glucose-Capillary: 254 mg/dL — ABNORMAL HIGH (ref 70–99)

## 2022-03-28 LAB — C DIFFICILE QUICK SCREEN W PCR REFLEX
C Diff antigen: NEGATIVE
C Diff interpretation: NOT DETECTED
C Diff toxin: NEGATIVE

## 2022-03-28 LAB — APTT: aPTT: 29 seconds (ref 24–36)

## 2022-03-28 MED ORDER — SODIUM CHLORIDE 0.9 % IV BOLUS
500.0000 mL | Freq: Once | INTRAVENOUS | Status: AC
  Administered 2022-03-28: 500 mL via INTRAVENOUS

## 2022-03-28 MED ORDER — INSULIN ASPART 100 UNIT/ML IJ SOLN
0.0000 [IU] | INTRAMUSCULAR | Status: DC
  Administered 2022-03-29: 1 [IU] via SUBCUTANEOUS
  Administered 2022-03-29: 3 [IU] via SUBCUTANEOUS

## 2022-03-28 MED ORDER — SODIUM CHLORIDE 0.9 % IV SOLN
1.0000 g | Freq: Once | INTRAVENOUS | Status: AC
  Administered 2022-03-28: 1 g via INTRAVENOUS
  Filled 2022-03-28: qty 10

## 2022-03-28 MED ORDER — DEXTROSE 50 % IV SOLN
1.0000 | Freq: Once | INTRAVENOUS | Status: AC
Start: 2022-03-28 — End: 2022-03-28
  Administered 2022-03-28: 50 mL via INTRAVENOUS
  Filled 2022-03-28: qty 50

## 2022-03-28 NOTE — ED Notes (Signed)
Son [name] [phone] would like an update immediately (he's on his way here but it's 2 hours away)

## 2022-03-28 NOTE — Assessment & Plan Note (Signed)
Presented with sugars in the 47s.  Unclear if she was eating following her insulin today.  She is on 50 units of Levemir. -Diabetes is uncontrolled with recent hemoglobin A1c of 10.1 on 02/10/2022 -improved now follow D50 with BG >200 -place on sensitive slide insulin q4hr checks - will need to consider lowering dose of insulin or changing more to sliding scale if pt is not eating much when family is unable to go help with feedings.

## 2022-03-28 NOTE — Assessment & Plan Note (Signed)
Stable

## 2022-03-28 NOTE — ED Provider Notes (Signed)
[facility] EMERGENCY DEPARTMENT Provider Note   CSN: [id number] Arrival date & time: 03/28/22  1819     History  Chief Complaint  Patient presents with   Hypoglycemia    [name] is a 76 y.o. female.  76 year old female with past medical history of CHF, diabetes, hypertension, CVA brought in by EMS from nursing facility.  Per report, patient was combative and altered with staff, noted to be diaphoretic.  She was found to be hypoglycemic with a blood sugar of 56.  EMS was called, arrived to find patient eating a sandwich and lemonade.  Repeat CBG found to be 64 after finishing her meal.  Patient arrives in the emergency room with repeat CBG of 59.  Patient tells me that her blood sugar was low, states that this is not normally a problem for her.  States that she is feeling well otherwise, denies being ill recently.  Denies chest pain, shortness of breath, abdominal pain.  No other complaints or concerns today.       Home Medications Prior to Admission medications   Medication Sig Start Date End Date Taking? Authorizing Provider  atorvastatin (LIPITOR) 40 MG tablet Take 0.5 tablets (20 mg total) by mouth daily. 04/10/20   Aline August, MD  ELIQUIS 5 MG TABS tablet Take 5 mg by mouth 2 (two) times daily.  12/30/19   [provider]  insulin detemir (LEVEMIR) 100 UNIT/ML injection INJECT 50 UNITS SUBCUTANEOUSLY ONCE DAILY 02/18/22   Biagio Borg, MD  Insulin Syringe-Needle U-100 (MM INSULIN SYRINGE/NEEDLE) 31G X 5/16" 1 ML MISC Use as directed four times per day E11.9 11/10/20   Biagio Borg, MD  Lancets MISC Use as directed four times per day E11.9 12/05/20   Biagio Borg, MD  LEVEMIR 100 UNIT/ML injection INJECT 50 UNITS SUBCUTANEOUSLY ONCE DAILY 02/18/22   Biagio Borg, MD  levETIRAcetam (KEPPRA) 500 MG tablet Take 1 tablet (500 mg total) by mouth 2 (two) times daily. 06/12/21   Biagio Borg, MD  losartan (COZAAR) 25 MG tablet Take 12.5 mg by mouth  daily.  12/30/19   [provider]  metFORMIN (GLUCOPHAGE-XR) 750 MG 24 hr tablet Take 1 tablet (750 mg total) by mouth daily with breakfast. 02/15/22   Biagio Borg, MD  metoprolol succinate (TOPROL-XL) 25 MG 24 hr tablet Take 25 mg by mouth daily.  12/30/19   [provider]  traMADol (ULTRAM) 50 MG tablet Take 1 tablet (50 mg total) by mouth every 6 (six) hours as needed for moderate pain. 07/25/20   Biagio Borg, MD      Allergies    Pork-derived products    Review of Systems   Review of Systems Negative except as per HPI Physical Exam Updated Vital Signs BP 106/67   Pulse 80   Temp 97.7 F (36.5 C) (Oral)   Resp 17   Ht '5\' 6"'$  (1.676 m)   Wt 70 kg   SpO2 100%   BMI 24.91 kg/m  Physical Exam Vitals and nursing note reviewed.  Constitutional:      General: She is not in acute distress.    Appearance: She is well-developed. She is not diaphoretic.  HENT:     Head: Normocephalic and atraumatic.     Mouth/Throat:     Mouth: Mucous membranes are moist.  Eyes:     Conjunctiva/sclera: Conjunctivae normal.  Cardiovascular:     Rate and Rhythm: Normal rate and regular  rhythm.     Heart sounds: Normal heart sounds.  Pulmonary:     Effort: Pulmonary effort is normal.     Breath sounds: Normal breath sounds.  Abdominal:     Palpations: Abdomen is soft.     Tenderness: There is no abdominal tenderness.  Musculoskeletal:     Cervical back: Neck supple.     Right lower leg: Edema present.     Left lower leg: Edema present.  Skin:    General: Skin is warm and dry.     Findings: No erythema or rash.  Neurological:     General: No focal deficit present.     Mental Status: She is alert.  Psychiatric:        Behavior: Behavior normal.     ED Results / Procedures / Treatments   Labs (all labs ordered are listed, but only abnormal results are displayed) Labs Reviewed  COMPREHENSIVE METABOLIC PANEL - Abnormal; Notable for the following components:       Result Value   Glucose, Bld 241 (*)    Calcium 8.0 (*)    Total Protein 5.1 (*)    Albumin 2.6 (*)    Total Bilirubin 0.2 (*)    All other components within normal limits  URINALYSIS, ROUTINE W REFLEX MICROSCOPIC - Abnormal; Notable for the following components:   APPearance HAZY (*)    Glucose, UA >=500 (*)    Hgb urine dipstick LARGE (*)    Leukocytes,Ua MODERATE (*)    All other components within normal limits  LACTIC ACID, PLASMA - Abnormal; Notable for the following components:   Lactic Acid, Venous 3.4 (*)    All other components within normal limits  URINALYSIS, MICROSCOPIC (REFLEX) - Abnormal; Notable for the following components:   Bacteria, UA FEW (*)    All other components within normal limits  CBG MONITORING, ED - Abnormal; Notable for the following components:   Glucose-Capillary 59 (*)    All other components within normal limits  CBG MONITORING, ED - Abnormal; Notable for the following components:   Glucose-Capillary 254 (*)    All other components within normal limits  CBG MONITORING, ED - Abnormal; Notable for the following components:   Glucose-Capillary 222 (*)    All other components within normal limits  C DIFFICILE QUICK SCREEN W PCR REFLEX    CULTURE, BLOOD (ROUTINE X 2)  CULTURE, BLOOD (ROUTINE X 2)  URINE CULTURE  CBC WITH DIFFERENTIAL/PLATELET  LACTIC ACID, PLASMA  PROTIME-INR  APTT  CBG MONITORING, ED  CBG MONITORING, ED    EKG None  Radiology DG Chest Port 1 View  Result Date: 03/28/2022 CLINICAL DATA:  Hypoglycemia in altered mental status EXAM: PORTABLE CHEST 1 VIEW COMPARISON:  Radiographs 04/05/2020 FINDINGS: Stable cardiomediastinal silhouette. Aortic atherosclerotic calcification. Bibasilar atelectasis. No definite pleural effusion or pneumothorax. No acute osseous abnormality. IMPRESSION: No active disease. Electronically Signed   By: Placido Sou M.D.   On: 03/28/2022 19:09    Procedures .Critical Care  Performed by: Tacy Learn, PA-C Authorized by: Tacy Learn, PA-C   Critical care provider statement:    Critical care time (minutes):  30   Critical care was time spent personally by me on the following activities:  Development of treatment plan with patient or surrogate, discussions with consultants, evaluation of patient's response to treatment, examination of patient, ordering and review of laboratory studies, ordering and review of radiographic studies, ordering and performing treatments and interventions, pulse oximetry, re-evaluation of patient's  condition and review of old charts     Medications Ordered in ED Medications  cefTRIAXone (ROCEPHIN) 1 g in sodium chloride 0.9 % 100 mL IVPB (1 g Intravenous New Bag/Given 03/28/22 2132)  dextrose 50 % solution 50 mL (50 mLs Intravenous Given 03/28/22 1841)  sodium chloride 0.9 % bolus 500 mL (500 mLs Intravenous New Bag/Given 03/28/22 2130)  sodium chloride 0.9 % bolus 500 mL (500 mLs Intravenous New Bag/Given 03/28/22 2130)    ED Course/ Medical Decision Making/ A&P                           Medical Decision Making Amount and/or Complexity of Data Reviewed Labs: ordered. Radiology: ordered. ECG/medicine tests: ordered.  Risk Prescription drug management. Decision regarding hospitalization.   This patient presents to the ED for concern of hypoglycemia, this involves an extensive number of treatment options, and is a complaint that carries with it a high risk of complications and morbidity.  The differential diagnosis includes but not limited to dehydration, metabolic derangement, infection   Co morbidities that complicate the patient evaluation  CHF, diabetes, hypertension, anemia, hyperlipidemia, prior GI bleed, CVA   Additional history obtained:  Additional history obtained from EMS as above Discussed with patient's son Sonia Side who is on his way to the ER. External records from outside source obtained and reviewed including discharge summary  from 04/10/2020 when patient was admitted after a fall resulting in a femur fracture along with a UTI/AKI.   Lab Tests:  I Ordered, and personally interpreted labs.  The pertinent results include: CBG initially 59, has improved with last check of 222.  Urinalysis suggestive of UTI with large hemoglobin, moderate leukocytes greater than 50 white cells and few bacteria.  Add on culture is pending.  CBC with normal white count.  CMP with elevated glucose otherwise no significant findings.  Initial lactic acid is elevated at 3.4, pending repeat. Patient with large diarrheal stool, will add C. difficile screen.   Imaging Studies ordered:  I ordered imaging studies including CXR  I independently visualized and interpreted imaging which showed no active disease I agree with the radiologist interpretation   Cardiac Monitoring: / EKG:  The patient was maintained on a cardiac monitor.  I personally viewed and interpreted the cardiac monitored which showed an underlying rhythm of: No active disease   Consultations Obtained:  I requested consultation with the hospitalist, Dr. Flossie Buffy,  and discussed lab and imaging findings as well as pertinent plan - they recommend: Admission   Problem List / ED Course / Critical interventions / Medication management  76 year old female brought in by EMS from nursing facility where patient was altered, combative and diaphoretic, found to be hypoglycemic.  Patient was persistently hypoglycemic after eating a snack and arriving in the emergency room with a glucose of 59.  She was given orange juice with sugar in it as well as an amp of D50.  On recheck, her blood sugars improved to the 200s, continue to monitor however remains stable at this point.  Her lactic acid returns elevated, she does have CHF with a EF of 55-60 on her last echo on file dated 04/06/2020.  She is given IV fluids. I ordered medication including Rocephin, IV fluids for UTI, elevated lactic acid.  I also  am but D50 for hypoglycemia Reevaluation of the patient after these medicines showed that the patient improved I have reviewed the patients home medicines and have made adjustments  as needed   Social Determinants of Health:  Lives at facility    Test / Admission - Considered:  Admit for further management           Final Clinical Impression(s) / ED Diagnoses Final diagnoses:  Hypoglycemia  Urinary tract infection without hematuria, site unspecified  Elevated lactic acid level    Rx / DC Orders ED Discharge Orders     None         Tacy Learn, PA-C 03/28/22 2157    Audley Hose, MD 03/30/22 1010

## 2022-03-28 NOTE — ED Triage Notes (Signed)
Pt to ER via EMS from [facility].  Reports at facility that pt became altered, combative and diaphoretic.  BS 56, pt given oral glucose and when EMS arrived she was drinking lemonade and eating a sandwich.  EMS allowed pt to finish meal and en route BS 64.  Pt is her baseline normal at this time, hx of CVA with right sided deficits.  Pt has dementia.

## 2022-03-28 NOTE — Assessment & Plan Note (Signed)
W/ residual right-sided hemiparesis.  Patient is chronically wheelchair and bedbound. -Continue Eliquis

## 2022-03-28 NOTE — H&P (Signed)
History and Physical    Patient: [name] ENI:[id number] DOB: 11/08/1945 DOA: 03/28/2022 DOS: the patient was seen and examined on 03/28/2022 PCP: Biagio Borg, MD  Patient coming from: SNF  Chief Complaint:  Chief Complaint  Patient presents with   Hypoglycemia   HPI: [name] is a 76 y.o. female with medical history significant of CVA, chronic diastolic heart failure, hypertension, hyperlipidemia and insulin-dependent type 2 diabetes who presents from SNF with concerns of altered mental status.  Reportedly nursing staff found her more disoriented and diaphoretic.  Had CBG of 56 and was given some food and juice with improvement only up to 60.  In the ED, CBG found to be in the 50s and was given amp of D50 with improvement in up to 200s. Son at bedside provides most of the history.  He normally goes to feed her every day since she does not like the food at her nursing facility but he was out of town for the past 2 days so unclear how much she was eating.  He also reports that her Levemir is suppose to be given in divided dose at 25 units BID with the morning dose as a sliding scale. However appears she has been getting 50 units each morning. HbA1C is uncontrolled at 10.1.  Otherwise she has no leukocytosis, CMP largely unremarkable. Lactate is elevated at 3.4 with UA showing moderate leukocyte but negative nitrite, few bacteria.  She was started empirically on IV Rocephin and hospitalist was consulted for admission. Review of Systems: As mentioned in the history of present illness. All other systems reviewed and are negative. Past Medical History:  Diagnosis Date   Acute blood loss anemia    CHF (congestive heart failure) (HCC)    EF 61-44%, grade 1 diastolic dysfunction per echo 04/2015   Coronary artery disease involving native artery of transplanted heart without angina pectoris    CVA (cerebral infarction) 04/2015   Started Plavix 04/2015   Depression    Diabetes  ([place]) 2016   Type II. On insulin   Enchondroma of bone 2011   left femur.    Fracture, intertrochanteric, right femur ([place])    History of lower GI bleeding    Hyperlipidemia    Hypertension    Patent foramen ovale 04/2015   Started on warfarin 04/2015   Protein calorie malnutrition ([place])    Stercoral ulcer of rectum 05/16/2015   Past Surgical History:  Procedure Laterality Date   CARDIAC SURGERY     Cath without stent   COLONOSCOPY N/A 05/15/2015   Procedure: COLONOSCOPY;  Surgeon: Mauri Pole, MD;  Location: [facility];  Service: Endoscopy;  Laterality: N/A;   FLEXIBLE SIGMOIDOSCOPY N/A 05/15/2015   Procedure: FLEXIBLE SIGMOIDOSCOPY;  Surgeon: Mauri Pole, MD;  Location: [facility];  Service: Endoscopy;  Laterality: N/A;  at bedside   INTRAMEDULLARY (IM) NAIL INTERTROCHANTERIC Right 06/12/2015   Procedure: INTRAMEDULLARY (IM) NAIL RIGHT HIP;  Surgeon: Renette Butters, MD;  Location: [facility];  Service: Orthopedics;  Laterality: Right;   TEE WITHOUT CARDIOVERSION N/A 05/05/2015   Procedure: TRANSESOPHAGEAL ECHOCARDIOGRAM (TEE);  Surgeon: Dixie Dials, MD;  Location: [facility] ENDOSCOPY;  Service: Cardiovascular;  Laterality: N/A;   Social History:  reports that she quit smoking about 6 years ago. Her smoking use included cigarettes. She has never used smokeless tobacco. She reports that she does not drink alcohol and does not use drugs.  Allergies  Allergen Reactions   Pork-Derived Products Other (See Comments)  To keep blood pressure down    Family History  Problem Relation Age of Onset   Hypertension Mother    Heart failure Mother    Hyperlipidemia Mother    Heart attack Father     Prior to Admission medications   Medication Sig Start Date End Date Taking? Authorizing Provider  atorvastatin (LIPITOR) 40 MG tablet Take 0.5 tablets (20 mg total) by mouth daily. 04/10/20   Aline August, MD  ELIQUIS 5 MG TABS tablet Take 5 mg by mouth 2 (two) times daily.   12/30/19   [provider]  insulin detemir (LEVEMIR) 100 UNIT/ML injection INJECT 50 UNITS SUBCUTANEOUSLY ONCE DAILY 02/18/22   Biagio Borg, MD  Insulin Syringe-Needle U-100 (MM INSULIN SYRINGE/NEEDLE) 31G X 5/16" 1 ML MISC Use as directed four times per day E11.9 11/10/20   Biagio Borg, MD  Lancets MISC Use as directed four times per day E11.9 12/05/20   Biagio Borg, MD  LEVEMIR 100 UNIT/ML injection INJECT 50 UNITS SUBCUTANEOUSLY ONCE DAILY 02/18/22   Biagio Borg, MD  levETIRAcetam (KEPPRA) 500 MG tablet Take 1 tablet (500 mg total) by mouth 2 (two) times daily. 06/12/21   Biagio Borg, MD  losartan (COZAAR) 25 MG tablet Take 12.5 mg by mouth daily.  12/30/19   [provider]  metFORMIN (GLUCOPHAGE-XR) 750 MG 24 hr tablet Take 1 tablet (750 mg total) by mouth daily with breakfast. 02/15/22   Biagio Borg, MD  metoprolol succinate (TOPROL-XL) 25 MG 24 hr tablet Take 25 mg by mouth daily.  12/30/19   [provider]  traMADol (ULTRAM) 50 MG tablet Take 1 tablet (50 mg total) by mouth every 6 (six) hours as needed for moderate pain. 07/25/20   Biagio Borg, MD    Physical Exam: Vitals:   03/28/22 1930 03/28/22 1945 03/28/22 2000 03/28/22 2249  BP: 130/70 131/69 106/67   Pulse: 83 81 80   Resp: (!) 21 (!) 21 17   Temp:    97.7 F (36.5 C)  TempSrc:    Oral  SpO2: 100% 100% 100%   Weight:      Height:       Constitutional: NAD, calm, comfortable female laying in bed Eyes: lids and conjunctivae normal ENMT: Mucous membranes are moist.  Neck: normal, supple,  Respiratory: clear to auscultation bilaterally, no wheezing, no crackles. Normal respiratory effort. No accessory muscle use.  Cardiovascular: Regular rate and rhythm, no murmurs / rubs / gallops.  Nonpitting edema bilateral lower distal extremity Abdomen: Soft, nontender bowel sounds positive.  Musculoskeletal: no clubbing / cyanosis. No joint deformity upper and lower extremities.  Right hand is contracted  and held in a fist residual from previous stroke.  0 out of 5 strength of right lower extremity. Skin: no rashes, lesions, ulcers.  Neurologic: CN 2-12 grossly intact. normal.  Psychiatric: Normal judgment and insight. Alert and oriented to self, place but not time.  Normal mood. Data Reviewed:  See HPI  Assessment and Plan: * Hypoglycemia Presented with sugars in the 16s.  Unclear if she was eating following her insulin today.  She is on 50 units of Levemir. -Diabetes is uncontrolled with recent hemoglobin A1c of 10.1 on 02/10/2022 -improved now follow D50 with BG >200 -place on sensitive slide insulin q4hr checks - will need to consider lowering dose of insulin or changing more to sliding scale if pt is not eating much when family is unable to go help with feedings.  UTI (urinary tract infection) Questionable UTI with UA showing moderate leukocyte, negative nitrite and few bacteria especially since her AMS is now improved -continue IV Rocephin pending urine culture  Acute metabolic encephalopathy Most likely secondary to acute hypoglycemia.  On evaluation, she is alert and oriented to self, place but not time although appears to be at her baseline.  She does not have a formal diagnosis of dementia.  History of stroke W/ residual right-sided hemiparesis.  Patient is chronically wheelchair and bedbound. -Continue Eliquis  Hypertension Continue home antihypertensives pending med rec  CHF (congestive heart failure) ([place]) Stable.      Advance Care Planning:   Code Status: Full Code -verified with MOST form at bedside  Consults: None  Family Communication: Discussed with son at bedside  Severity of Illness: The appropriate patient status for this patient is OBSERVATION. Observation status is judged to be reasonable and necessary in order to provide the required intensity of service to ensure the patient's safety. The patient's presenting symptoms, physical exam findings, and  initial radiographic and laboratory data in the context of their medical condition is felt to place them at decreased risk for further clinical deterioration. Furthermore, it is anticipated that the patient will be medically stable for discharge from the hospital within 2 midnights of admission.   Author: Orene Desanctis, DO 03/28/2022 11:09 PM  For on call review [email or web address].

## 2022-03-28 NOTE — Assessment & Plan Note (Signed)
Questionable UTI with UA showing moderate leukocyte, negative nitrite and few bacteria especially since her AMS is now improved -continue IV Rocephin pending urine culture

## 2022-03-28 NOTE — Assessment & Plan Note (Signed)
Continue home antihypertensives pending med rec 

## 2022-03-28 NOTE — Assessment & Plan Note (Signed)
Most likely secondary to acute hypoglycemia.  On evaluation, she is alert and oriented to self, place but not time although appears to be at her baseline.  She does not have a formal diagnosis of dementia.

## 2022-03-29 DIAGNOSIS — Z7401 Bed confinement status: Secondary | ICD-10-CM | POA: Diagnosis not present

## 2022-03-29 DIAGNOSIS — Z7901 Long term (current) use of anticoagulants: Secondary | ICD-10-CM | POA: Diagnosis not present

## 2022-03-29 DIAGNOSIS — I110 Hypertensive heart disease with heart failure: Secondary | ICD-10-CM | POA: Diagnosis present

## 2022-03-29 DIAGNOSIS — E785 Hyperlipidemia, unspecified: Secondary | ICD-10-CM | POA: Diagnosis present

## 2022-03-29 DIAGNOSIS — G9341 Metabolic encephalopathy: Secondary | ICD-10-CM | POA: Diagnosis present

## 2022-03-29 DIAGNOSIS — Z79899 Other long term (current) drug therapy: Secondary | ICD-10-CM | POA: Diagnosis not present

## 2022-03-29 DIAGNOSIS — Z87891 Personal history of nicotine dependence: Secondary | ICD-10-CM | POA: Diagnosis not present

## 2022-03-29 DIAGNOSIS — I2510 Atherosclerotic heart disease of native coronary artery without angina pectoris: Secondary | ICD-10-CM | POA: Diagnosis present

## 2022-03-29 DIAGNOSIS — Z7984 Long term (current) use of oral hypoglycemic drugs: Secondary | ICD-10-CM | POA: Diagnosis not present

## 2022-03-29 DIAGNOSIS — Z83438 Family history of other disorder of lipoprotein metabolism and other lipidemia: Secondary | ICD-10-CM | POA: Diagnosis not present

## 2022-03-29 DIAGNOSIS — N390 Urinary tract infection, site not specified: Secondary | ICD-10-CM | POA: Diagnosis present

## 2022-03-29 DIAGNOSIS — Z8249 Family history of ischemic heart disease and other diseases of the circulatory system: Secondary | ICD-10-CM | POA: Diagnosis not present

## 2022-03-29 DIAGNOSIS — Z993 Dependence on wheelchair: Secondary | ICD-10-CM | POA: Diagnosis not present

## 2022-03-29 DIAGNOSIS — I69351 Hemiplegia and hemiparesis following cerebral infarction affecting right dominant side: Secondary | ICD-10-CM | POA: Diagnosis not present

## 2022-03-29 DIAGNOSIS — I509 Heart failure, unspecified: Secondary | ICD-10-CM | POA: Diagnosis not present

## 2022-03-29 DIAGNOSIS — E162 Hypoglycemia, unspecified: Secondary | ICD-10-CM | POA: Diagnosis present

## 2022-03-29 DIAGNOSIS — Z8673 Personal history of transient ischemic attack (TIA), and cerebral infarction without residual deficits: Secondary | ICD-10-CM | POA: Diagnosis not present

## 2022-03-29 DIAGNOSIS — E872 Acidosis, unspecified: Secondary | ICD-10-CM | POA: Diagnosis present

## 2022-03-29 DIAGNOSIS — Z794 Long term (current) use of insulin: Secondary | ICD-10-CM | POA: Diagnosis not present

## 2022-03-29 DIAGNOSIS — I5032 Chronic diastolic (congestive) heart failure: Secondary | ICD-10-CM | POA: Diagnosis present

## 2022-03-29 DIAGNOSIS — D539 Nutritional anemia, unspecified: Secondary | ICD-10-CM | POA: Diagnosis present

## 2022-03-29 DIAGNOSIS — E11649 Type 2 diabetes mellitus with hypoglycemia without coma: Secondary | ICD-10-CM | POA: Diagnosis present

## 2022-03-29 LAB — CBG MONITORING, ED
Glucose-Capillary: 39 mg/dL — CL (ref 70–99)
Glucose-Capillary: 58 mg/dL — ABNORMAL LOW (ref 70–99)
Glucose-Capillary: 111 mg/dL — ABNORMAL HIGH (ref 70–99)
Glucose-Capillary: 60 mg/dL — ABNORMAL LOW (ref 70–99)
Glucose-Capillary: 129 mg/dL — ABNORMAL HIGH (ref 70–99)
Glucose-Capillary: 132 mg/dL — ABNORMAL HIGH (ref 70–99)
Glucose-Capillary: 140 mg/dL — ABNORMAL HIGH (ref 70–99)
Glucose-Capillary: 174 mg/dL — ABNORMAL HIGH (ref 70–99)
Glucose-Capillary: 190 mg/dL — ABNORMAL HIGH (ref 70–99)
Glucose-Capillary: 241 mg/dL — ABNORMAL HIGH (ref 70–99)
Glucose-Capillary: 263 mg/dL — ABNORMAL HIGH (ref 70–99)

## 2022-03-29 LAB — COMPREHENSIVE METABOLIC PANEL
ALT: 24 U/L (ref 0–44)
AST: 29 U/L (ref 15–41)
Albumin: 3.2 g/dL — ABNORMAL LOW (ref 3.5–5.0)
Alkaline Phosphatase: 71 U/L (ref 38–126)
Anion gap: 11 (ref 5–15)
BUN: 8 mg/dL (ref 8–23)
CO2: 24 mmol/L (ref 22–32)
Calcium: 9 mg/dL (ref 8.9–10.3)
Chloride: 108 mmol/L (ref 98–111)
Creatinine, Ser: 0.69 mg/dL (ref 0.44–1.00)
GFR, Estimated: >60 mL/min (ref >60)
Glucose, Bld: 199 mg/dL — ABNORMAL HIGH (ref 70–99)
Potassium: 3.9 mmol/L (ref 3.5–5.1)
Sodium: 143 mmol/L (ref 135–145)
Total Bilirubin: 0.6 mg/dL (ref 0.3–1.2)
Total Protein: 6.2 g/dL — ABNORMAL LOW (ref 6.5–8.1)

## 2022-03-29 LAB — CBC
HCT: 39.8 % (ref 36.0–46.0)
Hemoglobin: 10.2 g/dL — ABNORMAL LOW (ref 12.0–15.0)
MCH: 28.8 pg (ref 26.0–34.0)
MCHC: 25.6 g/dL — ABNORMAL LOW (ref 30.0–36.0)
MCV: 112.4 fL — ABNORMAL HIGH (ref 80.0–100.0)
Platelets: 150 10*3/uL (ref 150–400)
RBC: 3.54 MIL/uL — ABNORMAL LOW (ref 3.87–5.11)
RDW: 13.7 % (ref 11.5–15.5)
WBC: 6.1 10*3/uL (ref 4.0–10.5)
nRBC: 0 % (ref 0.0–0.2)

## 2022-03-29 LAB — URINE CULTURE: Culture: NO GROWTH

## 2022-03-29 LAB — GLUCOSE, CAPILLARY
Glucose-Capillary: 150 mg/dL — ABNORMAL HIGH (ref 70–99)
Glucose-Capillary: 113 mg/dL — ABNORMAL HIGH (ref 70–99)
Glucose-Capillary: 208 mg/dL — ABNORMAL HIGH (ref 70–99)

## 2022-03-29 LAB — LACTIC ACID, PLASMA
Lactic Acid, Venous: 2.2 mmol/L (ref 0.5–1.9)
Lactic Acid, Venous: 2.8 mmol/L (ref 0.5–1.9)
Lactic Acid, Venous: 2.2 mmol/L (ref 0.5–1.9)
Lactic Acid, Venous: 1.4 mmol/L (ref 0.5–1.9)

## 2022-03-29 LAB — PHOSPHORUS: Phosphorus: 3.2 mg/dL (ref 2.5–4.6)

## 2022-03-29 LAB — MAGNESIUM: Magnesium: 1.5 mg/dL — ABNORMAL LOW (ref 1.7–2.4)

## 2022-03-29 MED ORDER — LEVETIRACETAM 500 MG PO TABS
500.0000 mg | ORAL_TABLET | Freq: Two times a day (BID) | ORAL | Status: DC
  Administered 2022-03-29 – 2022-03-31 (×6): 500 mg via ORAL
  Filled 2022-03-29 (×6): qty 1

## 2022-03-29 MED ORDER — SODIUM CHLORIDE 0.9 % IV SOLN: INTRAVENOUS | Status: DC

## 2022-03-29 MED ORDER — MAGNESIUM SULFATE 2 GM/50ML IV SOLN
2.0000 g | Freq: Once | INTRAVENOUS | Status: AC
  Administered 2022-03-29: 2 g via INTRAVENOUS
  Filled 2022-03-29: qty 50

## 2022-03-29 MED ORDER — SODIUM CHLORIDE 0.9 % IV SOLN
1.0000 g | INTRAVENOUS | Status: DC
  Administered 2022-03-29: 1 g via INTRAVENOUS
  Filled 2022-03-29: qty 10

## 2022-03-29 MED ORDER — APIXABAN 5 MG PO TABS
5.0000 mg | ORAL_TABLET | Freq: Two times a day (BID) | ORAL | Status: DC
  Administered 2022-03-29 – 2022-03-31 (×6): 5 mg via ORAL
  Filled 2022-03-29 (×6): qty 1

## 2022-03-29 MED ORDER — DEXTROSE 10 % IV SOLN: INTRAVENOUS | Status: AC

## 2022-03-29 MED ORDER — INSULIN ASPART 100 UNIT/ML IJ SOLN
0.0000 [IU] | Freq: Three times a day (TID) | INTRAMUSCULAR | Status: DC
  Administered 2022-03-30: 1 [IU] via SUBCUTANEOUS
  Administered 2022-03-30 – 2022-03-31 (×4): 2 [IU] via SUBCUTANEOUS
  Administered 2022-03-31: 1 [IU] via SUBCUTANEOUS

## 2022-03-29 NOTE — ED Notes (Signed)
RN asked secretary to page admitting Dr. Myna Hidalgo for a critical CBG. Dr. Myna Hidalgo returned call to RN for critical CBG of 60 and was instructed to give a D 10 infusion. Will continue to monitor CBG for this patient.

## 2022-03-29 NOTE — Progress Notes (Signed)
PROGRESS NOTE    [name]  PZW:[id number] DOB: 1945-11-14 DOA: 03/28/2022 PCP: Biagio Borg, MD   Brief Narrative:  HPI per Dr. Ileene Musa on 03/28/22 [name] is a 76 y.o. female with medical history significant of CVA, chronic diastolic heart failure, hypertension, hyperlipidemia and insulin-dependent type 2 diabetes who presents from SNF with concerns of altered mental status.   Reportedly nursing staff found her more disoriented and diaphoretic.  Had CBG of 56 and was given some food and juice with improvement only up to 60.  In the ED, CBG found to be in the 50s and was given amp of D50 with improvement in up to 200s. Son at bedside provides most of the history.  He normally goes to feed her every day since she does not like the food at her nursing facility but he was out of town for the past 2 days so unclear how much she was eating.  He also reports that her Levemir is suppose to be given in divided dose at 25 units BID with the morning dose as a sliding scale. However appears she has been getting 50 units each morning. HbA1C is uncontrolled at 10.1.   Otherwise she has no leukocytosis, CMP largely unremarkable. Lactate is elevated at 3.4 with UA showing moderate leukocyte but negative nitrite, few bacteria.   She was started empirically on IV Rocephin and hospitalist was consulted for admission.  **Interim History Patient continued to be hypoglycemic by drinking the juice so she was placed on 10% dextrose infusion for 6 hours and this is now stopped.  PT OT evaluated and they are recommending SNF.   Assessment and Plan: * Hypoglycemia Presented with sugars in the 50s.   -Unclear if she was eating following her insulin today.  She is on 50 units of Levemir and likely this is too much for her -Diabetes is uncontrolled with recent hemoglobin A1c of 10.1 on 02/10/2022 -improved now follow D50 with BG >200; had to be placed on a short-term D10 drip and now CBGs have been  ranging from 113-263 -place on sensitive slide insulin q4hr checks -Will need to consider lowering dose of insulin or changing more to sliding scale if pt is not eating much when family is unable to go help with feedings. -PT OT recommending SNF  UTI (urinary tract infection) -Questionable UTI with UA showing moderate leukocyte, negative nitrite and few bacteria especially since her AMS is now improved -Blood Cx x2 Pending   -continue IV Rocephin pending urine culture  Acute metabolic encephalopathy -Most likely secondary to acute hypoglycemia.  On evaluation, she is alert and oriented to self, place but not time although appears to be at her baseline.   -She does not have a formal diagnosis of dementia.  History of stroke -W/ residual right-sided hemiparesis.  Patient is chronically wheelchair and bedbound. -Continue Eliquis  Lactic Acidosis -Patient's lactic acid level went from 2.2 is now 2.8 and will repeat -Reinitiating IV fluids with normal saline at 75 mils per hour -Patient was given sodium chloride bolus 500x2  Hypertension -Continue home antihypertensives pending med rec  Chronic diastolic CHF (congestive heart failure) (HCC) -Continue to monitor for signs and symptoms of volume overload -I's and O's and daily weights -Last echo showed an EF of 55 to 60% with grade 1 diastolic CHF -We will need to be judicious with IV fluid but given that she has a lactic acidosis will need to continue IV fluids for now  Macrocytic Anemia -Patient's hemoglobin/hematocrit went from 12.3/40.2 is now 10.2/39.8 with an MCV of 112.4 -Check anemia panel in the a.m. -Continue monitor for signs and symptoms of bleeding; no overt bleeding noted -Repeat CBC in a.m.  DVT prophylaxis: SCDs Start: 03/28/22 2244 apixaban (ELIQUIS) tablet 5 mg    Code Status: Full Code Family Communication: No family present at bedside  Disposition Plan:  Level of care: Med-Surg Status is: Observation The  patient will require care spanning > 2 midnights and should be moved to inpatient because: Continued to be Hypoglycemic   Consultants:  None  Procedures:  None  Antimicrobials:  Anti-infectives (From admission, onward)    Start     Dose/Rate Route Frequency Ordered Stop   03/29/22 2130  cefTRIAXone (ROCEPHIN) 1 g in sodium chloride 0.9 % 100 mL IVPB        1 g 200 mL/hr over 30 Minutes Intravenous Every 24 hours 03/29/22 0105     03/28/22 2130  cefTRIAXone (ROCEPHIN) 1 g in sodium chloride 0.9 % 100 mL IVPB        1 g 200 mL/hr over 30 Minutes Intravenous  Once 03/28/22 2127 03/28/22 2324       Subjective: Seen and examined at bedside and she is little bit more awake and alert.  Denies any complaints or pain.  No nausea or vomiting.  Feels okay.  No other concerns or complaints at this time.  Objective: Vitals:   03/29/22 1230 03/29/22 1400 03/29/22 1430 03/29/22 1453  BP: 124/75 126/69 128/75 136/81  Pulse: 73  68 68  Resp: '15 15 18 18  '$ Temp:  98.2 F (36.8 C) 98.4 F (36.9 C) 97.7 F (36.5 C)  TempSrc:  Oral  Oral  SpO2: 100% 100% 100% 100%  Weight:      Height:        Intake/Output Summary (Last 24 hours) at 03/29/2022 1805 Last data filed at 03/29/2022 1104 Gross per 24 hour  Intake 450.04 ml  Output --  Net 450.04 ml   Filed Weights   03/28/22 1826  Weight: 70 kg   Examination: Physical Exam:  Constitutional: WN/WD thin elderly Caucasian female currently no acute distress Respiratory: Diminished to auscultation bilaterally, no wheezing, rales, rhonchi or crackles. Normal respiratory effort and patient is not tachypenic. No accessory muscle use.  Unlabored breathing Cardiovascular: RRR, no murmurs / rubs / gallops. S1 and S2 auscultated.  Abdomen: Soft, non-tender, non-distended. Bowel sounds positive.  GU: Deferred. Musculoskeletal: No clubbing / cyanosis of digits/nails. No joint deformity upper and lower extremities.  Skin: No rashes, lesions, ulcers  on limited skin evaluation. No induration; Warm and dry.  Neurologic: CN 2-12 grossly intact with no focal deficits.  Romberg sign and cerebellar reflexes not assessed.  Psychiatric: Slightly impaired judgment and insight. Alert and oriented x 2. Normal mood and appropriate affect.   Data Reviewed: I have personally reviewed following labs and imaging studies  CBC: Recent Labs  Lab 03/28/22 1829 03/29/22 0742  WBC 9.6 6.1  NEUTROABS 7.0  --   HGB 12.3 10.2*  HCT 40.2 39.8  MCV 92.4 112.4*  PLT 211 542   Basic Metabolic Panel: Recent Labs  Lab 03/28/22 1829  NA 141  K 3.5  CL 111  CO2 22  GLUCOSE 241*  BUN 10  CREATININE 0.77  CALCIUM 8.0*   GFR: Estimated Creatinine Clearance: 55.1 mL/min (by C-G formula based on SCr of 0.77 mg/dL). Liver Function Tests: Recent Labs  Lab 03/28/22 1829  AST 22  ALT 16  ALKPHOS 63  BILITOT 0.2*  PROT 5.1*  ALBUMIN 2.6*   No results for input(s): "LIPASE", "AMYLASE" in the last 168 hours. No results for input(s): "AMMONIA" in the last 168 hours. Coagulation Profile: Recent Labs  Lab 03/28/22 2115  INR 1.1   Cardiac Enzymes: No results for input(s): "CKTOTAL", "CKMB", "CKMBINDEX", "TROPONINI" in the last 168 hours. BNP (last 3 results) Recent Labs    07/27/21 1509  PROBNP 26.0   HbA1C: No results for input(s): "HGBA1C" in the last 72 hours. CBG: Recent Labs  Lab 03/29/22 0955 03/29/22 1109 03/29/22 1336 03/29/22 1508 03/29/22 1651  GLUCAP 190* 241* 263* 150* 113*   Lipid Profile: No results for input(s): "CHOL", "HDL", "LDLCALC", "TRIG", "CHOLHDL", "LDLDIRECT" in the last 72 hours. Thyroid Function Tests: No results for input(s): "TSH", "T4TOTAL", "FREET4", "T3FREE", "THYROIDAB" in the last 72 hours. Anemia Panel: No results for input(s): "VITAMINB12", "FOLATE", "FERRITIN", "TIBC", "IRON", "RETICCTPCT" in the last 72 hours. Sepsis Labs: Recent Labs  Lab 03/28/22 1919 03/28/22 2115 03/29/22 1144  03/29/22 1358  LATICACIDVEN 3.4* 2.3* 2.2* 2.8*    Recent Results (from the past 240 hour(s))  Urine Culture     Status: None   Collection Time: 03/28/22  8:43 PM   Specimen: In/Out Cath Urine  Result Value Ref Range Status   Specimen Description IN/OUT CATH URINE  Final   Special Requests NONE  Final   Culture   Final    NO GROWTH Performed at [facility], [place] [address], [place], [place] [address]    Report Status 03/29/2022 FINAL  Final  C Difficile Quick Screen w PCR reflex     Status: None   Collection Time: 03/28/22  8:45 PM   Specimen: STOOL  Result Value Ref Range Status   C Diff antigen NEGATIVE NEGATIVE Final   C Diff toxin NEGATIVE NEGATIVE Final   C Diff interpretation No C. difficile detected.  Final    Comment: Performed at [facility], [place] [address], [place], [place] [address]    Radiology Studies: DG Chest Port 1 View  Result Date: 03/28/2022 CLINICAL DATA:  Hypoglycemia in altered mental status EXAM: PORTABLE CHEST 1 VIEW COMPARISON:  Radiographs 04/05/2020 FINDINGS: Stable cardiomediastinal silhouette. Aortic atherosclerotic calcification. Bibasilar atelectasis. No definite pleural effusion or pneumothorax. No acute osseous abnormality. IMPRESSION: No active disease. Electronically Signed   By: Placido Sou M.D.   On: 03/28/2022 19:09    Scheduled Meds:  apixaban  5 mg Oral BID   insulin aspart  0-9 Units Subcutaneous Q4H   levETIRAcetam  500 mg Oral BID   Continuous Infusions:  cefTRIAXone (ROCEPHIN)  IV      LOS: 0 days   Raiford Noble, DO [facility] Available via Epic secure chat 7am-7pm After these hours, please refer to coverage provider listed on [email or web address] 03/29/2022, 6:05 PM

## 2022-03-29 NOTE — ED Notes (Signed)
Pt's CBG after recheck is 58. Pt given another 8oz of orange juice. Will continue to monitor and recheck CBG in 15 mins.

## 2022-03-29 NOTE — ED Notes (Signed)
Pt's CBG had not been checked in a couple hours. CBG was checked on pt and pt found to have a CBG of 39. Pt alert enough to drink. Pt give a cup of orange juice. Will recheck a CBG in 15 mins.

## 2022-03-29 NOTE — ED Notes (Signed)
Son [name] [phone] would like an update immedately

## 2022-03-29 NOTE — Progress Notes (Signed)
New Admission Note:   Arrival Method: Stretcher Mental Orientation: Alert and oriented to person and place Telemetry: NSR Assessment: Completed Skin: some redness to buttocks IV: Left hand and left forearm Pain: only with movement of Right hand Tubes: none Safety Measures: Safety Fall education provided Admission: Completed [facility] Orientation: Patient has been orientated to the room, unit and staff.  Family: none at bedside at this time Incontinent of bowel and bladder Orders have been reviewed and implemented. Will continue to monitor the patient. Call light has been placed within reach and bed alarm has been activated.  Patient was cleaned and bathed with the help of PT staff.   Talbot Grumbling RN [facility] Phone: [phone]

## 2022-03-29 NOTE — Progress Notes (Signed)
Pt continues to be hypoglycemic despite drinking juice and will be placed on 10% dextrose infusion for now.

## 2022-03-29 NOTE — ED Notes (Signed)
Pt repositioned for lunch, linen readjusted, pt sitting in bed with no apparent complaints.

## 2022-03-29 NOTE — Evaluation (Signed)
Physical Therapy Evaluation Patient Details Name: [name] MRN: [id number] DOB: 1946/05/05 Today's Date: 03/29/2022  History of Present Illness  Patient is a 76 y/o female who presents from [facility] with [facility]. Found to have hypoglycemia and UTI. PMH includes CVA with residual right sided spastic hemiparesis, patent foramen ovale, CAD, depression, DM, HTN, protein calorie malnutrition, chronic diastolic heart failure.  Clinical Impression  Patient presents with right spastic hemiparesis, impaired balance, weakness, impaired cognition and impaired mobility s/p above. Pt is from [facility] however unsure of many details regarding PLOF as pt answers "I dont know" to many questions. Reports the facility using a lift to transfer her to a w/c and total A for all ADLs. Today, pt requires total-Max A to roll in both directions, better able to roll towards right due to use of LUE to reach for rail. Flexor synergy pattern of RUE and extensor tone of RLE. Pain with any movement of right side of body. Pt may be at functional baseline- will need to chat with family/facility. Will follow up for further mobility assessment with a second person to determine if pt is appropriate for therapy services. Recommend return to SNF.       Recommendations for follow up therapy are one component of a multi-disciplinary discharge planning process, led by the attending physician.  Recommendations may be updated based on patient status, additional functional criteria and insurance authorization.  Follow Up Recommendations Skilled nursing-short term rehab (<3 hours/day) Can patient physically be transported by private vehicle: No    Assistance Recommended at Discharge Frequent or constant Supervision/Assistance  Patient can return home with the following  Two people to help with walking and/or transfers;Two people to help with bathing/dressing/bathroom;Assistance with feeding;Direct supervision/assist for financial  management;Assistance with cooking/housework;Direct supervision/assist for medications management    Equipment Recommendations None recommended by PT  Recommendations for Other Services       Functional Status Assessment Patient has had a recent decline in their functional status and/or demonstrates limited ability to make significant improvements in function in a reasonable and predictable amount of time (unsure)     Precautions / Restrictions Precautions Precautions: Fall Restrictions Weight Bearing Restrictions: No      Mobility  Bed Mobility Overal bed mobility: Needs Assistance Bed Mobility: Rolling Rolling: Max assist, Total assist         General bed mobility comments: Rolling to right/left x4 for pericare and bed change, able to reach with LUE to rail with max cues and assist. Grimacing in pain.    Transfers                   General transfer comment: Deferred due to needing second person    Ambulation/Gait               General Gait Details: Unable  Stairs            Wheelchair Mobility    Modified Rankin (Stroke Patients Only) Modified Rankin (Stroke Patients Only) Pre-Morbid Rankin Score: Severe disability Modified Rankin: Severe disability     Balance                                             Pertinent Vitals/Pain Pain Assessment Pain Assessment: Faces Faces Pain Scale: Hurts little more Pain Location: grimacing with rolling Pain Descriptors / Indicators: Grimacing Pain Intervention(s): Monitored during session,  Repositioned    Home Living Family/patient expects to be discharged to:: Skilled nursing facility                   Additional Comments: [facility]    Prior Function Prior Level of Function : Needs assist             Mobility Comments: pt reports she is non ambulatory and they use a lift to transfer her to w/c at SNF ADLs Comments: dependent     Hand Dominance    Dominant Hand: Right    Extremity/Trunk Assessment   Upper Extremity Assessment Upper Extremity Assessment: Defer to OT evaluation;RUE deficits/detail RUE Deficits / Details: spastic hemiparesis, pain with PROM, closed palm, flexor posture RUE: Unable to fully assess due to pain    Lower Extremity Assessment Lower Extremity Assessment: RLE deficits/detail RLE Deficits / Details: No active ROM noted, decreased ankle DF and knee flexion, extensor tone present. RLE: Unable to fully assess due to pain RLE Sensation: decreased light touch       Communication      Cognition Arousal/Alertness: Awake/alert Behavior During Therapy: WFL for tasks assessed/performed Overall Cognitive Status: Impaired/Different from baseline Area of Impairment: Orientation, Memory, Following commands, Problem solving, Awareness                 Orientation Level: Disoriented to, Time, Situation   Memory: Decreased short-term memory Following Commands: Follows one step commands with increased time   Awareness: Intellectual Problem Solving: Decreased initiation, Difficulty sequencing, Requires verbal cues, Requires tactile cues General Comments: "i don't know" to most questions asked regarding PLOF/history. Hx of dementia at baseline. KNows she is in the hospital. Thinks it is January. Agreeable and pleasant. Sitting in stool and soaking wet in the bed upon arrival.        General Comments General comments (skin integrity, edema, etc.): VSS on RA.    Exercises     Assessment/Plan    PT Assessment Patient needs continued PT services  PT Problem List Decreased strength;Decreased mobility;Impaired tone;Decreased range of motion;Decreased cognition;Pain;Impaired sensation;Decreased balance       PT Treatment Interventions Therapeutic activities;Wheelchair mobility training;Balance training;Functional mobility training;Patient/family education;Therapeutic exercise;Neuromuscular  re-education;Cognitive remediation    PT Goals (Current goals can be found in the Care Plan section)  Acute Rehab PT Goals Patient Stated Goal: none stated PT Goal Formulation: Patient unable to participate in goal setting Time For Goal Achievement: 04/12/22 Potential to Achieve Goals: Fair    Frequency Min 2X/week     Co-evaluation               AM-PAC PT "6 Clicks" Mobility  Outcome Measure Help needed turning from your back to your side while in a flat bed without using bedrails?: Total Help needed moving from lying on your back to sitting on the side of a flat bed without using bedrails?: Total Help needed moving to and from a bed to a chair (including a wheelchair)?: Total Help needed standing up from a chair using your arms (e.g., wheelchair or bedside chair)?: Total Help needed to walk in hospital room?: Total Help needed climbing 3-5 steps with a railing? : Total 6 Click Score: 6    End of Session   Activity Tolerance: Patient tolerated treatment well Patient left: in bed;with call bell/phone within reach;with bed alarm set Nurse Communication: Mobility status;Need for lift equipment PT Visit Diagnosis: Hemiplegia and hemiparesis;Muscle weakness (generalized) (M62.81) Hemiplegia - Right/Left: Right Hemiplegia - dominant/non-dominant: Dominant Hemiplegia -  caused by: Unspecified    Time: 1510-1530 PT Time Calculation (min) (ACUTE ONLY): 20 min   Charges:   PT Evaluation $PT Eval Moderate Complexity: 1 Mod          Marisa Severin, PT, DPT Acute Rehabilitation Services Secure chat preferred [facility] [place] 03/29/2022, 3:44 PM

## 2022-03-30 DIAGNOSIS — E162 Hypoglycemia, unspecified: Secondary | ICD-10-CM | POA: Diagnosis not present

## 2022-03-30 DIAGNOSIS — Z8673 Personal history of transient ischemic attack (TIA), and cerebral infarction without residual deficits: Secondary | ICD-10-CM | POA: Diagnosis not present

## 2022-03-30 DIAGNOSIS — G9341 Metabolic encephalopathy: Secondary | ICD-10-CM | POA: Diagnosis not present

## 2022-03-30 DIAGNOSIS — I509 Heart failure, unspecified: Secondary | ICD-10-CM | POA: Diagnosis not present

## 2022-03-30 LAB — COMPREHENSIVE METABOLIC PANEL
ALT: 25 U/L (ref 0–44)
AST: 27 U/L (ref 15–41)
Albumin: 2.8 g/dL — ABNORMAL LOW (ref 3.5–5.0)
Alkaline Phosphatase: 74 U/L (ref 38–126)
Anion gap: 6 (ref 5–15)
BUN: 8 mg/dL (ref 8–23)
CO2: 26 mmol/L (ref 22–32)
Calcium: 8.6 mg/dL — ABNORMAL LOW (ref 8.9–10.3)
Chloride: 111 mmol/L (ref 98–111)
Creatinine, Ser: 0.74 mg/dL (ref 0.44–1.00)
GFR, Estimated: >60 mL/min (ref >60)
Glucose, Bld: 184 mg/dL — ABNORMAL HIGH (ref 70–99)
Potassium: 3.9 mmol/L (ref 3.5–5.1)
Sodium: 143 mmol/L (ref 135–145)
Total Bilirubin: 0.5 mg/dL (ref 0.3–1.2)
Total Protein: 5.5 g/dL — ABNORMAL LOW (ref 6.5–8.1)

## 2022-03-30 LAB — CBC WITH DIFFERENTIAL/PLATELET
Abs Immature Granulocytes: 0.02 10*3/uL (ref 0.00–0.07)
Basophils Absolute: 0 10*3/uL (ref 0.0–0.1)
Basophils Relative: 0 %
Eosinophils Absolute: 0.3 10*3/uL (ref 0.0–0.5)
Eosinophils Relative: 4 %
HCT: 39.6 % (ref 36.0–46.0)
Hemoglobin: 12.5 g/dL (ref 12.0–15.0)
Immature Granulocytes: 0 %
Lymphocytes Relative: 36 %
Lymphs Abs: 2.3 10*3/uL (ref 0.7–4.0)
MCH: 28.2 pg (ref 26.0–34.0)
MCHC: 31.6 g/dL (ref 30.0–36.0)
MCV: 89.2 fL (ref 80.0–100.0)
Monocytes Absolute: 0.5 10*3/uL (ref 0.1–1.0)
Monocytes Relative: 8 %
Neutro Abs: 3.4 10*3/uL (ref 1.7–7.7)
Neutrophils Relative %: 52 %
Platelets: 190 10*3/uL (ref 150–400)
RBC: 4.44 MIL/uL (ref 3.87–5.11)
RDW: 13.2 % (ref 11.5–15.5)
WBC: 6.4 10*3/uL (ref 4.0–10.5)
nRBC: 0 % (ref 0.0–0.2)

## 2022-03-30 LAB — GLUCOSE, CAPILLARY
Glucose-Capillary: 126 mg/dL — ABNORMAL HIGH (ref 70–99)
Glucose-Capillary: 186 mg/dL — ABNORMAL HIGH (ref 70–99)
Glucose-Capillary: 184 mg/dL — ABNORMAL HIGH (ref 70–99)
Glucose-Capillary: 234 mg/dL — ABNORMAL HIGH (ref 70–99)

## 2022-03-30 LAB — PHOSPHORUS: Phosphorus: 3 mg/dL (ref 2.5–4.6)

## 2022-03-30 LAB — MAGNESIUM: Magnesium: 2.2 mg/dL (ref 1.7–2.4)

## 2022-03-30 MED ORDER — SODIUM CHLORIDE 0.9 % IV SOLN
1.0000 g | INTRAVENOUS | Status: AC
  Administered 2022-03-30: 1 g via INTRAVENOUS
  Filled 2022-03-30: qty 10

## 2022-03-30 NOTE — Plan of Care (Signed)
  Problem: Nutritional: Goal: Maintenance of adequate nutrition will improve Outcome: Not Progressing   Problem: Skin Integrity: Goal: Risk for impaired skin integrity will decrease Outcome: Not Progressing   Problem: Metabolic: Goal: Ability to maintain appropriate glucose levels will improve Outcome: Not Progressing

## 2022-03-30 NOTE — Evaluation (Signed)
Occupational Therapy Evaluation Patient Details Name: [name] MRN: [id number] DOB: Nov 15, 1945 Today's Date: 03/30/2022   History of Present Illness Patient is a 76 y/o female who presents from [facility] with [place]. Found to have hypoglycemia and UTI. PMH includes CVA with residual right sided spastic hemiparesis, patent foramen ovale, CAD, depression, DM, HTN, protein calorie malnutrition, chronic diastolic heart failure.   Clinical Impression   Patient evaluated by Occupational Therapy and found to be at her baseline, using LT hand only for feeding with Min As and to wipe face with full setup and cues. Per son, these are the two tasks that pt has retained since her CVA in 2016.  OT made attempts to assess and clean her RT palm however pt with too much pain to tolerate and MD/RN notified of concerns for pt's RT palm integrity.  Unfortunately, pt not found to have rehab potential at this time without risk of intolerable pain to RUE and son did request that pt's RT UE be left alone and pt only rolled to LT side as rolling to RT is too painful for pt in her Nursing home.  See below for any follow-up Occupational Therapy or equipment needs. OT is signing off. Thank you for this referral.       Recommendations for follow up therapy are one component of a multi-disciplinary discharge planning process, led by the attending physician.  Recommendations may be updated based on patient status, additional functional criteria and insurance authorization.   Follow Up Recommendations  Long-term institutional care without follow-up therapy    Assistance Recommended at Discharge Frequent or constant Supervision/Assistance  Patient can return home with the following Two people to help with walking and/or transfers;A lot of help with bathing/dressing/bathroom;Two people to help with bathing/dressing/bathroom;A lot of help with walking and/or transfers    Functional Status Assessment  Patient has had a  recent decline in their functional status and/or demonstrates limited ability to make significant improvements in function in a reasonable and predictable amount of time  Equipment Recommendations       Recommendations for Other Services       Precautions / Restrictions Precautions Precautions: Fall Precaution Comments: Painful RUE. Son, [name] asks to rol pt to LT only and try not to move RUE Restrictions Weight Bearing Restrictions: No      Mobility Bed Mobility                    Transfers                          Balance                                           ADL either performed or assessed with clinical judgement   ADL Overall ADL's : At baseline;Needs assistance/impaired Eating/Feeding: Bed level;Supervision/ safety;Minimal assistance Eating/Feeding Details (indicate cue type and reason): Pt able to demonstrate using LT UE to reach for water on try and sip Mod I. Pt cannot tolerate AAROM to shoulder past ~80 degrees. Grooming: Dance movement psychotherapist;Set up;Supervision/safety;Bed level;Cueing for sequencing Grooming Details (indicate cue type and reason): Warm washcloth placed in LT hand. Pt bend head down to LT hand to compensate for proximaly weakness and able to thoroughly clean face with cues. Upper Body Bathing: Bed level;Total assistance   Lower Body Bathing: Total assistance;Bed  level   Upper Body Dressing : Bed level;Total assistance   Lower Body Dressing: Bed level;Total assistance     Toilet Transfer Details (indicate cue type and reason): Requires Harrel Lemon Toileting- Clothing Manipulation and Hygiene: Total assistance;Bed level Toileting - Clothing Manipulation Details (indicate cue type and reason): Incontienent of bowel and bladder.             Vision   Vision Assessment?: No apparent visual deficits Additional Comments: Able to spot water on table, reach for cup.     Perception     Praxis      Pertinent  Vitals/Pain Pain Assessment Pain Assessment: Faces Faces Pain Scale: Hurts whole lot Pain Location: Attempting to assess RT hand. Long warm up of gentle, very limited ROM to wrist an forearm to allow hand to come closer to neutral postiion to inspect palm. Music played in attempt to distract pt. Pt unable to tolerate single later of warm washcloth gliding between thumb and lateral hand (spastic key pinch grip) or cried out with attempt to glide single later washcloth between digits and palm. Able to successfully perform one "swipe" under thumb with grime coming out on cloth. Noted pt's fingernails appear to be nearly growing inrto plan and D5 may already be peiercing palm but unable to supine forearm enough to sufficiently view. RN was notified with recommendations for pain meds and hand cleaning. Pt unable to toelrate any kind of palm guard at this time. Pain Descriptors / Indicators: Crying, Grimacing, Guarding, Moaning Pain Intervention(s): Limited activity within patient's tolerance, Relaxation (RN notified)     Hand Dominance Right   Extremity/Trunk Assessment Upper Extremity Assessment Upper Extremity Assessment: LUE deficits/detail RUE Deficits / Details: spastic hemiplegia, pain with minimal PROM to forearm/wrist/hand, closed palm, flexor posture.  Very concerned for RT palm integrity. RUE: Unable to fully assess due to pain RUE Coordination: decreased fine motor;decreased gross motor LUE Deficits / Details: Very weak at shoulder, requiring AAROM and not tolerating past ~80 degrees. Grip/forearm/elbow WFL. LUE: Shoulder pain with ROM   Lower Extremity Assessment Lower Extremity Assessment: Defer to PT evaluation   Cervical / Trunk Assessment Cervical / Trunk Assessment: Kyphotic   Communication Communication Communication: Expressive difficulties (And cognitive impairments.)   Cognition Arousal/Alertness: Awake/alert Behavior During Therapy: Flat affect Overall Cognitive Status:  History of cognitive impairments - at baseline Area of Impairment: Orientation, Memory, Following commands, Problem solving, Awareness                 Orientation Level: Disoriented to, Time, Situation   Memory: Decreased short-term memory Following Commands: Follows one step commands with increased time   Awareness: Intellectual   General Comments: "i don't know" to most questions asked regarding PLOF/history and tastes although pt able to self correct one when asked if she liked music saying "No", then "Yes". Hx of dementia at baseline. Knows she is in the hospital.     General Comments       Exercises     Shoulder Instructions      Home Living Family/patient expects to be discharged to:: Skilled nursing facility                                 Additional Comments: [facility]      Prior Functioning/Environment Prior Level of Function : Needs assist  Cognitive Assist : ADLs (cognitive)   ADLs (Cognitive): Step by step cues Physical Assist : ADLs (  physical)   ADLs (physical): IADLs;Toileting;Dressing;Bathing;Grooming;Feeding Mobility Comments: Son reports she is non ambulatory and they use a lift to transfer her to w/c at [facility]. ADLs Comments: Dependent with all except Minimal assist for feeding and using washcloth to wipe face, all bed level with LUE only.  Son reports pt's CVA was in 2016.        OT Problem List: Decreased range of motion;Pain;Decreased coordination;Impaired tone      OT Treatment/Interventions:      OT Goals(Current goals can be found in the care plan section) Acute Rehab OT Goals OT Goal Formulation: Patient unable to participate in goal setting Potential to Achieve Goals: Poor  OT Frequency:      Co-evaluation              AM-PAC OT "6 Clicks" Daily Activity     Outcome Measure Help from another person eating meals?: A Little Help from another person taking care of personal grooming?: A Lot Help from another  person toileting, which includes using toliet, bedpan, or urinal?: Total Help from another person bathing (including washing, rinsing, drying)?: Total Help from another person to put on and taking off regular upper body clothing?: Total Help from another person to put on and taking off regular lower body clothing?: Total 6 Click Score: 9   End of Session Nurse Communication: Other (comment) (Rn and MD notified of concerns for RT palm and pain with attempts to assess and clean)  Activity Tolerance: Patient limited by pain Patient left: in bed;with call bell/phone within reach;with bed alarm set  OT Visit Diagnosis: Pain;Other symptoms and signs involving cognitive function;Hemiplegia and hemiparesis Hemiplegia - Right/Left: Right Hemiplegia - dominant/non-dominant: Dominant Hemiplegia - caused by: Cerebral infarction Pain - Right/Left: Right Pain - part of body: Shoulder;Arm;Hand                Time: 1027-2536 OT Time Calculation (min): 17 min Charges:  OT General Charges $OT Visit: 1 Visit OT Evaluation $OT Eval Low Complexity: 1 Low  Rahil Passey, [place] Office: [phone] 03/30/2022   Julien Girt 03/30/2022, 10:00 AM

## 2022-03-30 NOTE — TOC Progression Note (Signed)
Transition of Care [facility]) - Initial/Assessment Note    Patient Details  Name: [name] MRN: [id number] Date of Birth: 22-Jun-1946  [facility] CM/SW Contact:    Milinda Antis, [place] Phone Number: 03/30/2022, 1:55 PM  Clinical Narrative:                 CSW contacted admissions at [facility] and verified that the patient is LT care at the facility and can return when medically ready.  TOC will continue to follow.          Patient Goals and CMS Choice        Expected Discharge Plan and Services                                                Prior Living Arrangements/Services                       Activities of Daily Living Home Assistive Devices/Equipment: Wheelchair ADL Screening (condition at time of admission) Patient's cognitive ability adequate to safely complete daily activities?: No Is the patient deaf or have difficulty hearing?: No Does the patient have difficulty seeing, even when wearing glasses/contacts?: No Does the patient have difficulty concentrating, remembering, or making decisions?: Yes Patient able to express need for assistance with ADLs?: Yes Does the patient have difficulty dressing or bathing?: Yes Independently performs ADLs?: No Communication: Appropriate for developmental age Grooming: Needs assistance Is this a change from baseline?: Pre-admission baseline Feeding: Needs assistance Is this a change from baseline?: Pre-admission baseline Bathing: Dependent Is this a change from baseline?: Pre-admission baseline Toileting: Dependent Is this a change from baseline?: Pre-admission baseline In/Out Bed: Needs assistance Is this a change from baseline?: Pre-admission baseline Does the patient have difficulty walking or climbing stairs?: Yes Weakness of Legs: Both Weakness of Arms/Hands: Both  Permission Sought/Granted                  Emotional Assessment              Admission  diagnosis:  Hypoglycemia [E16.2] Elevated lactic acid level [R79.89] Urinary tract infection without hematuria, site unspecified [N39.0] Patient Active Problem List   Diagnosis Date Noted   Hypoglycemia 67/89/3810   Acute metabolic encephalopathy 17/51/0258   UTI (urinary tract infection) 03/28/2022   Encounter for well adult exam with abnormal findings 07/27/2021   HLD (hyperlipidemia) 04/14/2021   Vitamin D deficiency 04/14/2021   B12 deficiency 04/14/2021   Rupture of left biceps tendon 06/19/2020   Goals of care, counseling/discussion    Palliative care by specialist    DNR (do not resuscitate) discussion    Sepsis without acute organ dysfunction ([place]) 04/05/2020   AKI (acute kidney injury) ([place]) 04/05/2020   Hyperkalemia 04/05/2020   Abnormal LFTs 04/05/2020   Mild protein malnutrition ([place]) 04/05/2020   Grade I diastolic dysfunction 52/77/8242   Closed fracture of right distal femur ([place]) 04/05/2020   Prolonged QT interval 04/05/2020   Dysuria 06/12/2019   Preventative health care 08/18/2018   Partial symptomatic epilepsy with complex partial seizures, not intractable, without status epilepticus ([place]) 12/08/2017   Shingles 10/26/2017   Constipation 04/20/2017   Skin ulcer of right heel, limited to breakdown of skin ([place]) 02/10/2017   Muscle spasticity 01/20/2016   Bilateral lower extremity edema 12/23/2015   Acute  confusional state 10/28/2015   Right spastic hemiparesis ([place]) 10/07/2015   Right shoulder pain 07/24/2015   Fracture, intertrochanteric, right femur ([place]) 06/10/2015   Stercoral ulcer of rectum 05/16/2015   GI bleed 05/13/2015   BRBPR (bright red blood per rectum) 05/13/2015   CHF (congestive heart failure) ([place])    Diabetes ([place])    Hypertension    History of stroke    Lower GI bleed    Left-sided cerebrovascular accident (CVA) ([place]) 04/28/2015   PCP:  Biagio Borg, MD Pharmacy:   [place] ([place]), [place] - [address] [place] ([place]) [place] [address] Phone: [phone] Fax: [phone]     Social Determinants of Health (SDOH) Interventions    Readmission Risk Interventions     No data to display

## 2022-03-30 NOTE — Progress Notes (Signed)
PROGRESS NOTE    [name]  ZOX:[id number] DOB: [birth year] DOA: 03/28/2022 PCP: Biagio Borg, MD   Brief Narrative:  HPI per Dr. Ileene Musa on 03/28/22 [name] is a 76 y.o. female with medical history significant of CVA, chronic diastolic heart failure, hypertension, hyperlipidemia and insulin-dependent type 2 diabetes who presents from SNF with concerns of altered mental status.   Reportedly nursing staff found her more disoriented and diaphoretic.  Had CBG of 56 and was given some food and juice with improvement only up to 60.  In the ED, CBG found to be in the 50s and was given amp of D50 with improvement in up to 200s. Son at bedside provides most of the history.  He normally goes to feed her every day since she does not like the food at her nursing facility but he was out of town for the past 2 days so unclear how much she was eating.  He also reports that her Levemir is suppose to be given in divided dose at 25 units BID with the morning dose as a sliding scale. However appears she has been getting 50 units each morning. HbA1C is uncontrolled at 10.1.   Otherwise she has no leukocytosis, CMP largely unremarkable. Lactate is elevated at 3.4 with UA showing moderate leukocyte but negative nitrite, few bacteria.   She was started empirically on IV Rocephin and hospitalist was consulted for admission.   **Interim History Patient continued to be hypoglycemic by drinking the juice so she was placed on 10% dextrose infusion for 6 hours and this is now stopped.  PT OT evaluated and they are recommending SNF Case workers verify that she is a long-term care patient at the facility and can return when medically stable.  Her blood sugars are now stable  Assessment and Plan: * Hypoglycemia Presented with sugars in the 50s.   -Unclear if she was eating following her insulin today.  She is on 50 units of Levemir and likely this is too much for her -Diabetes is uncontrolled with recent  hemoglobin A1c of 10.1 on 02/10/2022 -improved now follow D50 with BG >200; had to be placed on a short-term D10 drip and now CBGs have been ranging from 126-208 -place on sensitive slide insulin q4hr checks and changed to before meals and at bedtime -Will need to consider lowering dose of insulin or changing more to sliding scale if pt is not eating much when family is unable to go help with feedings at discharge. -PT OT recommending SNF when she is a long-term care patient    UTI (urinary tract infection) -Questionable UTI with UA showing moderate leukocyte, negative nitrite and few bacteria especially since her AMS is now improved -Blood Cx x2 Pending which showed no growth to date at 2 days -Continued IV ceftriaxone but urine culture showed no growth -We will discontinue IV ceftriaxone after today's dose given that she will receive 3 doses   Acute metabolic encephalopathy -Most likely secondary to acute hypoglycemia.  On evaluation, she is alert and oriented to self, place but not time although appears to be at her baseline.   -She does not have a formal diagnosis of dementia. -Mentation is improved and she is at her baseline now   History of stroke -W/ residual right-sided hemiparesis and contractures of her right hand.  Patient is chronically wheelchair and bedbound. -OT made attempts to assess including her right palm however given her contracture she had too much pain to tolerate  is concern for the patient's right palm integrity given that her fingers are contracted into her palm; OT feels that she is not a rehab potential without the risk of intolerable pain to the right upper extremity -OT assessed and per their evaluation " Her digits appear to be near or already piercing her RT palm. She can't tolerate a palm guard or ever a single later washcloth" -We will need outpatient evaluation and may need injections of either Botox to relax her hand. -Continue Eliquis   Lactic  Acidosis -Patient's lactic acid level went from 2.2 is now 2.8 and will repeat -Reinitiating IV fluids with normal saline at 75 mils per hour -Patient was given sodium chloride bolus 500x2   Hypertension -Continue home antihypertensives pending med rec -Continue monitor blood pressure for protocol and last blood pressure reading was 155/75   Chronic diastolic CHF (congestive heart failure) (HCC) -Continue to monitor for signs and symptoms of volume overload -I's and O's and daily weights -Last echo showed an EF of 55 to 60% with grade 1 diastolic CHF -We will need to be judicious with IV fluid but given that she has a lactic acidosis and lactic acid gnosis is improved so we have stopped her IV fluids   Macrocytic Anemia -Patient's hemoglobin/hematocrit is stable at 12.5/39.6 with an MCV of 89.2 -Check anemia panel in the outpatient setting -Continue monitor for signs and symptoms of bleeding; no overt bleeding noted -Repeat CBC in a.m.  DVT prophylaxis: SCDs Start: 03/28/22 2244 apixaban (ELIQUIS) tablet 5 mg    Code Status: Full Code Family Communication: No family currently at bedside  Disposition Plan:  Level of care: Med-Surg Status is: Inpatient Remains inpatient appropriate because: Blood sugars have improved and she is doing much better.  Lactic acid level has improved as well.  She will get her last dose of IV ceftriaxone can be discharged to her long-term care facility in the morning   Consultants:  None  Procedures:  As delineated as above  Antimicrobials:  Anti-infectives (From admission, onward)    Start     Dose/Rate Route Frequency Ordered Stop   03/29/22 2130  cefTRIAXone (ROCEPHIN) 1 g in sodium chloride 0.9 % 100 mL IVPB        1 g 200 mL/hr over 30 Minutes Intravenous Every 24 hours 03/29/22 0105     03/28/22 2130  cefTRIAXone (ROCEPHIN) 1 g in sodium chloride 0.9 % 100 mL IVPB        1 g 200 mL/hr over 30 Minutes Intravenous  Once 03/28/22 2127  03/28/22 2324       Subjective: Seen and examined at bedside and she is eating her lunch without issues.  She denied complaints of pain.  No nausea or vomiting.  Blood sugars remaining within range.  No other concerns or complaints this time and appears back to her baseline.  Objective: Vitals:   03/30/22 0408 03/30/22 0544 03/30/22 0920 03/30/22 1631  BP: (!) 142/76  (!) 146/74 (!) 155/75  Pulse: 71  73 76  Resp: '18  18 18  '$ Temp: (!) 97.3 F (36.3 C) 97.6 F (36.4 C) 98 F (36.7 C) 98.6 F (37 C)  TempSrc: Oral Oral Oral Oral  SpO2: 100%  100% 98%  Weight:      Height:        Intake/Output Summary (Last 24 hours) at 03/30/2022 1715 Last data filed at 03/30/2022 1148 Gross per 24 hour  Intake 854.24 ml  Output 1552 ml  Net -  697.76 ml   Filed Weights   03/28/22 1826  Weight: 70 kg   Examination: Physical Exam:  Constitutional: WN/WD chronically ill appearing female in no acute distress Respiratory: Diminished to auscultation bilaterally, no wheezing, rales, rhonchi or crackles. Normal respiratory effort and patient is not tachypenic. No accessory muscle use.  Unlabored breathing Cardiovascular: RRR, no murmurs / rubs / gallops. S1 and S2 auscultated.  No appreciable extremity edema Abdomen: Soft, non-tender, non-distended. Bowel sounds positive.  GU: Deferred. Musculoskeletal: No clubbing / cyanosis of digits/nails.  Has right arm and hand contractures and hemiparesis and weakness Skin: No rashes, lesions, ulcers on limited skin evaluation. No induration; Warm and dry.  Neurologic: CN 2-12 grossly intact with no focal deficits. Romberg sign and cerebellar reflexes not assessed.  Psychiatric: Normal judgment and insight. Alert and oriented x 2  Data Reviewed: I have personally reviewed following labs and imaging studies  CBC: Recent Labs  Lab 03/28/22 1829 03/29/22 0742 03/30/22 0307  WBC 9.6 6.1 6.4  NEUTROABS 7.0  --  3.4  HGB 12.3 10.2* 12.5  HCT 40.2 39.8  39.6  MCV 92.4 112.4* 89.2  PLT 211 150 017   Basic Metabolic Panel: Recent Labs  Lab 03/28/22 1829 03/29/22 1828 03/30/22 0307  NA 141 143 143  K 3.5 3.9 3.9  CL 111 108 111  CO2 '22 24 26  '$ GLUCOSE 241* 199* 184*  BUN '10 8 8  '$ CREATININE 0.77 0.69 0.74  CALCIUM 8.0* 9.0 8.6*  MG  --  1.5* 2.2  PHOS  --  3.2 3.0   GFR: Estimated Creatinine Clearance: 55.1 mL/min (by C-G formula based on SCr of 0.74 mg/dL). Liver Function Tests: Recent Labs  Lab 03/28/22 1829 03/29/22 1828 03/30/22 0307  AST '22 29 27  '$ ALT '16 24 25  '$ ALKPHOS 63 71 74  BILITOT 0.2* 0.6 0.5  PROT 5.1* 6.2* 5.5*  ALBUMIN 2.6* 3.2* 2.8*   No results for input(s): "LIPASE", "AMYLASE" in the last 168 hours. No results for input(s): "AMMONIA" in the last 168 hours. Coagulation Profile: Recent Labs  Lab 03/28/22 2115  INR 1.1   Cardiac Enzymes: No results for input(s): "CKTOTAL", "CKMB", "CKMBINDEX", "TROPONINI" in the last 168 hours. BNP (last 3 results) Recent Labs    07/27/21 1509  PROBNP 26.0   HbA1C: No results for input(s): "HGBA1C" in the last 72 hours. CBG: Recent Labs  Lab 03/29/22 1651 03/29/22 2139 03/30/22 0729 03/30/22 1140 03/30/22 1619  GLUCAP 113* 208* 126* 186* 184*   Lipid Profile: No results for input(s): "CHOL", "HDL", "LDLCALC", "TRIG", "CHOLHDL", "LDLDIRECT" in the last 72 hours. Thyroid Function Tests: No results for input(s): "TSH", "T4TOTAL", "FREET4", "T3FREE", "THYROIDAB" in the last 72 hours. Anemia Panel: No results for input(s): "VITAMINB12", "FOLATE", "FERRITIN", "TIBC", "IRON", "RETICCTPCT" in the last 72 hours. Sepsis Labs: Recent Labs  Lab 03/29/22 1144 03/29/22 1358 03/29/22 1926 03/29/22 2250  LATICACIDVEN 2.2* 2.8* 2.2* 1.4    Recent Results (from the past 240 hour(s))  Urine Culture     Status: None   Collection Time: 03/28/22  8:43 PM   Specimen: In/Out Cath Urine  Result Value Ref Range Status   Specimen Description IN/OUT CATH URINE   Final   Special Requests NONE  Final   Culture   Final    NO GROWTH Performed at [facility], [place] [address] [place], [place] [address]    Report Status 03/29/2022 FINAL  Final  C Difficile Quick Screen w PCR reflex  Status: None   Collection Time: 03/28/22  8:45 PM   Specimen: STOOL  Result Value Ref Range Status   C Diff antigen NEGATIVE NEGATIVE Final   C Diff toxin NEGATIVE NEGATIVE Final   C Diff interpretation No C. difficile detected.  Final    Comment: Performed at [facility], [place] [address], [place], [place] [address]  Blood Culture (routine x 2)     Status: None (Preliminary result)   Collection Time: 03/28/22  9:15 PM   Specimen: BLOOD  Result Value Ref Range Status   Specimen Description BLOOD LEFT WRIST  Final   Special Requests   Final    BOTTLES DRAWN AEROBIC AND ANAEROBIC Blood Culture results may not be optimal due to an excessive volume of blood received in culture bottles   Culture   Final    NO GROWTH 2 DAYS Performed at [facility], [place] [address], [place], [place] [address]    Report Status PENDING  Incomplete  Blood Culture (routine x 2)     Status: None (Preliminary result)   Collection Time: 03/28/22  9:20 PM   Specimen: BLOOD  Result Value Ref Range Status   Specimen Description BLOOD LEFT ARM  Final   Special Requests   Final    BOTTLES DRAWN AEROBIC AND ANAEROBIC Blood Culture results may not be optimal due to an excessive volume of blood received in culture bottles   Culture   Final    NO GROWTH 2 DAYS Performed at [facility], [place] [address] [place], [place] [address]    Report Status PENDING  Incomplete     Radiology Studies: DG Chest Port 1 View  Result Date: 03/28/2022 CLINICAL DATA:  Hypoglycemia in altered mental status EXAM: PORTABLE CHEST 1 VIEW COMPARISON:  Radiographs 04/05/2020 FINDINGS: Stable cardiomediastinal silhouette. Aortic atherosclerotic calcification. Bibasilar atelectasis. No definite  pleural effusion or pneumothorax. No acute osseous abnormality. IMPRESSION: No active disease. Electronically Signed   By: Placido Sou M.D.   On: 03/28/2022 19:09     Scheduled Meds:  apixaban  5 mg Oral BID   insulin aspart  0-9 Units Subcutaneous TID WC   levETIRAcetam  500 mg Oral BID   Continuous Infusions:  sodium chloride 75 mL/hr at 03/30/22 0816   cefTRIAXone (ROCEPHIN)  IV Stopped (03/29/22 2142)    LOS: 1 day   Raiford Noble, DO [facility] Available via Epic secure chat 7am-7pm After these hours, please refer to coverage provider listed on [email or web address] 03/30/2022, 5:15 PM

## 2022-03-31 DIAGNOSIS — E162 Hypoglycemia, unspecified: Secondary | ICD-10-CM | POA: Diagnosis not present

## 2022-03-31 LAB — CBC WITH DIFFERENTIAL/PLATELET
Abs Immature Granulocytes: 0.02 10*3/uL (ref 0.00–0.07)
Basophils Absolute: 0 10*3/uL (ref 0.0–0.1)
Basophils Relative: 1 %
Eosinophils Absolute: 0.3 10*3/uL (ref 0.0–0.5)
Eosinophils Relative: 4 %
HCT: 35.6 % — ABNORMAL LOW (ref 36.0–46.0)
Hemoglobin: 11.5 g/dL — ABNORMAL LOW (ref 12.0–15.0)
Immature Granulocytes: 0 %
Lymphocytes Relative: 36 %
Lymphs Abs: 2.5 10*3/uL (ref 0.7–4.0)
MCH: 28.6 pg (ref 26.0–34.0)
MCHC: 32.3 g/dL (ref 30.0–36.0)
MCV: 88.6 fL (ref 80.0–100.0)
Monocytes Absolute: 0.6 10*3/uL (ref 0.1–1.0)
Monocytes Relative: 8 %
Neutro Abs: 3.4 10*3/uL (ref 1.7–7.7)
Neutrophils Relative %: 51 %
Platelets: 194 10*3/uL (ref 150–400)
RBC: 4.02 MIL/uL (ref 3.87–5.11)
RDW: 13.2 % (ref 11.5–15.5)
WBC: 6.8 10*3/uL (ref 4.0–10.5)
nRBC: 0 % (ref 0.0–0.2)

## 2022-03-31 LAB — COMPREHENSIVE METABOLIC PANEL
ALT: 30 U/L (ref 0–44)
AST: 28 U/L (ref 15–41)
Albumin: 2.8 g/dL — ABNORMAL LOW (ref 3.5–5.0)
Alkaline Phosphatase: 67 U/L (ref 38–126)
Anion gap: 10 (ref 5–15)
BUN: 9 mg/dL (ref 8–23)
CO2: 23 mmol/L (ref 22–32)
Calcium: 8.8 mg/dL — ABNORMAL LOW (ref 8.9–10.3)
Chloride: 109 mmol/L (ref 98–111)
Creatinine, Ser: 0.7 mg/dL (ref 0.44–1.00)
GFR, Estimated: >60 mL/min (ref >60)
Glucose, Bld: 165 mg/dL — ABNORMAL HIGH (ref 70–99)
Potassium: 4 mmol/L (ref 3.5–5.1)
Sodium: 142 mmol/L (ref 135–145)
Total Bilirubin: 0.5 mg/dL (ref 0.3–1.2)
Total Protein: 5.3 g/dL — ABNORMAL LOW (ref 6.5–8.1)

## 2022-03-31 LAB — PHOSPHORUS: Phosphorus: 3 mg/dL (ref 2.5–4.6)

## 2022-03-31 LAB — GLUCOSE, CAPILLARY
Glucose-Capillary: 147 mg/dL — ABNORMAL HIGH (ref 70–99)
Glucose-Capillary: 166 mg/dL — ABNORMAL HIGH (ref 70–99)
Glucose-Capillary: 155 mg/dL — ABNORMAL HIGH (ref 70–99)

## 2022-03-31 LAB — MAGNESIUM: Magnesium: 1.8 mg/dL (ref 1.7–2.4)

## 2022-03-31 MED ORDER — LEVETIRACETAM 500 MG PO TABS: 500.0000 mg | ORAL_TABLET | Freq: Two times a day (BID) | ORAL | 0 refills | Status: DC

## 2022-03-31 MED ORDER — INSULIN DETEMIR 100 UNIT/ML ~~LOC~~ SOLN: SUBCUTANEOUS | 3 refills | Status: DC

## 2022-03-31 NOTE — Progress Notes (Signed)
Attempted to give report x 2 to give report.

## 2022-03-31 NOTE — TOC Transition Note (Signed)
Transition of Care [facility]) - CM/SW Discharge Note   Patient Details  Name: [name] MRN: [id number] Date of Birth: 01-29-1946  [facility]) CM/SW Contact:  Milinda Antis, [place] Phone Number: 03/31/2022, 10:49 AM   Clinical Narrative:    Patient will DC to:  [facility] Anticipated DC date:  03/31/2022 Family notified: Yes Transport by:  Corey Harold   Per MD patient ready for DC to SNF. RN to call report prior to discharge [phone] room 1103B). RN,  patient's family, and facility notified of DC. Discharge Summary sent to facility. DC packet on chart. Ambulance transport will be requested for patient.   CSW will sign off for now as social work intervention is no longer needed. Please consult [place] again if new needs arise.     Final next level of care: Long Term Nursing Home Barriers to Discharge: No Barriers Identified   Patient Goals and CMS Choice        Discharge Placement              Patient chooses bed at:  [facility]) Patient to be transferred to facility by: [facility] Name of family member notified: [name] (Son)   [phone] Patient and family notified of of transfer: 03/31/22  Discharge Plan and Services                                     Social Determinants of Health (SDOH) Interventions     Readmission Risk Interventions     No data to display

## 2022-03-31 NOTE — Discharge Summary (Addendum)
Physician Discharge Summary  [name] MPN:[id number] DOB: Jun 27, 1946 DOA: 03/28/2022  PCP: Biagio Borg, MD  Admit date: 03/28/2022 Discharge date: 03/31/2022  Time spent: 27 minutes  Recommendations for Outpatient Follow-up:  Note dosage change and frequency change of Levemir to 12 units twice daily-May need to either titrate up or down depending on blood sugars over the next several days please check sugars at facility 4 times daily ACHS Get Chem-12 CBC in about 1 week's time Please evaluate right and for Botox injections  Discharge Diagnoses:  MAIN problem for hospitalization   Hypoglycemia on admission Asymptomatic bacteriuria Multi-infarct cognitive issues HFpEF macrocytic anemia  Please see below for itemized issues addressed in HOpsital- refer to other progress notes for clarity if needed  Discharge Condition: Improved  Diet recommendation: Diabetic  Filed Weights   03/28/22 1826  Weight: 70 kg    History of present illness:  76 year old black female known history of 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 04/28/2015, PFO CAD with grade 1 diastolic dysfunction HFpEF Prior lower GI bleed 05/2015 Home stercoral ulcer HTN HLD Chronic right-sided hemiparesis  Presented from her long-term facility with diaphoresis and disorientation CBGs in the 50s-in the ED given amp of D50 with improvement to the 200s Apparently was taking large dose, 50 units of Levemir daily instead of the usual 25 twice daily  Lactic acid was elevated and there was some concerns with moderate leukocytes negative nitrites therefore she was covered for urinary tract infection   Hospital Course:  Metabolic encephalopathy secondary to hypoglycemia Encephalopathy seems to be resolved-Levemir has been downward titrated to 12 units twice daily--she can continue low-dose metformin on discharge She was transiently on D10 during hospital stay and her dosage of Levemir should be  adjusted based on every 4 checks  Asymptomatic bacteriuria on admission Blood cultures were negative urine culture was negative she was given 3 days of antibiotics  Prior history of stroke in 2016 chronic contractures of the right hand Patient will need evaluation by therapy in the outpatient Botox to relax her hand She should continue on Eliquis  Lactic acidosis on admission Probably from anorexia and now eating This stabilized during hospital stay  HFpEF last echo EF 55 to 60% Stabilized during hospital stay there were no concerns for acute issue  Procedures:  Consultations: None  Discharge Exam: Vitals:   03/31/22 0330 03/31/22 0922  BP: (!) 142/77 (!) 142/78  Pulse: 77 72  Resp: 18   Temp: 98 F (36.7 C) 98.3 F (36.8 C)  SpO2: 97% 100%    Subj on day of d/c   She is verbal but not coherent time or place she knows she is at the hospital She has no chest pain no fever She has no nausea no vomiting and was eating some this morning  General Exam on discharge  EOMI NCAT dense hemiparesis on right side Chest is clear no rales rhonchi Abdomen is soft no rebound She is weak on the right side cannot rule straight leg raise  Discharge Instructions   Discharge Instructions     Diet - low sodium heart healthy   Complete by: As directed    Increase activity slowly   Complete by: As directed       Allergies as of 03/31/2022       Reactions   Pork-derived Products Other (See Comments)   To keep blood pressure down        Medication List     STOP  taking these medications    acetaminophen 650 MG CR tablet Commonly known as: TYLENOL   Dramamine Motion Sickness 25 MG Chew Generic drug: Meclizine HCl   traMADol 50 MG tablet Commonly known as: ULTRAM   UNABLE TO FIND       TAKE these medications    atorvastatin 40 MG tablet Commonly known as: LIPITOR Take 0.5 tablets (20 mg total) by mouth daily.   bisacodyl 10 MG suppository Commonly known  as: DULCOLAX Place 10 mg rectally as needed for moderate constipation. If no bowel movement within 72 hours or Milk of Magnesia, may give 1 suppository rectally (or 1 tablet) as needed for constipation and inform MD.   bisacodyl 5 MG EC tablet Commonly known as: DULCOLAX Take 5 mg by mouth as needed for moderate constipation. If no bowel movement within 72 hours or Milk of Magnesia, may give 1 tablet by mouth (or 1 suppository) as needed for constipation and inform MD.   Eliquis 5 MG Tabs tablet Generic drug: apixaban Take 5 mg by mouth 2 (two) times daily.   insulin detemir 100 UNIT/ML injection Commonly known as: Levemir INJECT 12 UNITS SUBCUTANEOUSLY twice daily What changed: additional instructions   levETIRAcetam 500 MG tablet Commonly known as: KEPPRA Take 1 tablet (500 mg total) by mouth 2 (two) times daily.   loperamide 2 MG tablet Commonly known as: IMODIUM A-D Take 2-4 mg by mouth See admin instructions. Give 2 tablets (4 mg) by mouth at onset of diarrhea, then 1 tablet (2 mg) after each stool. (Not to exceed 16 mg total). If diarrhea persists for 36 hours notify MD.   losartan 25 MG tablet Commonly known as: COZAAR Take 12.5 mg by mouth daily.   magnesium hydroxide 400 MG/5ML suspension Commonly known as: MILK OF MAGNESIA Take 30 mLs by mouth 2 (two) times daily as needed for mild constipation. Use Miralax if Milk of Magnesia is contraindicated (example: Dialysis resident)   metFORMIN 750 MG 24 hr tablet Commonly known as: GLUCOPHAGE-XR Take 1 tablet (750 mg total) by mouth daily with breakfast.   metoprolol succinate 25 MG 24 hr tablet Commonly known as: TOPROL-XL Take 25 mg by mouth daily.   Mylanta Maximum Strength 400-400-40 MG/5ML suspension Generic drug: alum & mag hydroxide-simeth Take 30 mLs by mouth 3 (three) times daily as needed for indigestion. (Not to exceed 3 doses daily). Inform MD if used more than 3 days. **Do not give at same time as antibiotics,  Digoxin or Coumadin**   nystatin cream Commonly known as: MYCOSTATIN Apply 1 Application topically See admin instructions. Apply topically 2 times a day for cutaneous or mucocutaneous candida rash of abdomen folds, groin and under breasts for 21 days. D/C order if rash is healed after 21 days. **Update MD if rash not resolved at that time**   polyethylene glycol 17 g packet Commonly known as: MIRALAX / GLYCOLAX Take 17 g by mouth daily as needed for mild constipation or moderate constipation. Use Miralax if Milk of Magnesia is contraindicated (Example: Dialysis resident)   promethazine 25 MG tablet Commonly known as: PHENERGAN Take 25 mg by mouth every 4 (four) hours as needed for nausea or vomiting. May use suppository if unable to take by mouth. Notify MD if nausea persists after second dose.   ROBITUSSIN COUGH/COLD CF PO Take 10 mLs by mouth every 4 (four) hours as needed (Cough). **Notify MD if cough persists, congestion x3 days or accompanied by fever**       Allergies  Allergen Reactions   Pork-Derived Products Other (See Comments)    To keep blood pressure down      The results of significant diagnostics from this hospitalization (including imaging, microbiology, ancillary and laboratory) are listed below for reference.    Significant Diagnostic Studies: DG Chest Port 1 View  Result Date: 03/28/2022 CLINICAL DATA:  Hypoglycemia in altered mental status EXAM: PORTABLE CHEST 1 VIEW COMPARISON:  Radiographs 04/05/2020 FINDINGS: Stable cardiomediastinal silhouette. Aortic atherosclerotic calcification. Bibasilar atelectasis. No definite pleural effusion or pneumothorax. No acute osseous abnormality. IMPRESSION: No active disease. Electronically Signed   By: Placido Sou M.D.   On: 03/28/2022 19:09    Microbiology: Recent Results (from the past 240 hour(s))  Urine Culture     Status: None   Collection Time: 03/28/22  8:43 PM   Specimen: In/Out Cath Urine  Result Value Ref  Range Status   Specimen Description IN/OUT CATH URINE  Final   Special Requests NONE  Final   Culture   Final    NO GROWTH Performed at [facility], [address] [place], [place] [address]    Report Status 03/29/2022 FINAL  Final  C Difficile Quick Screen w PCR reflex     Status: None   Collection Time: 03/28/22  8:45 PM   Specimen: STOOL  Result Value Ref Range Status   C Diff antigen NEGATIVE NEGATIVE Final   C Diff toxin NEGATIVE NEGATIVE Final   C Diff interpretation No C. difficile detected.  Final    Comment: Performed at [facility], [place] [address] [place], [place] [address]  Blood Culture (routine x 2)     Status: None (Preliminary result)   Collection Time: 03/28/22  9:15 PM   Specimen: BLOOD  Result Value Ref Range Status   Specimen Description BLOOD LEFT WRIST  Final   Special Requests   Final    BOTTLES DRAWN AEROBIC AND ANAEROBIC Blood Culture results may not be optimal due to an excessive volume of blood received in culture bottles   Culture   Final    NO GROWTH 3 DAYS Performed at [facility], [place] [address] [place], [place] [address]    Report Status PENDING  Incomplete  Blood Culture (routine x 2)     Status: None (Preliminary result)   Collection Time: 03/28/22  9:20 PM   Specimen: BLOOD  Result Value Ref Range Status   Specimen Description BLOOD LEFT ARM  Final   Special Requests   Final    BOTTLES DRAWN AEROBIC AND ANAEROBIC Blood Culture results may not be optimal due to an excessive volume of blood received in culture bottles   Culture   Final    NO GROWTH 3 DAYS Performed at [facility], [place] [address] [place], [place] [address]    Report Status PENDING  Incomplete     Labs: Basic Metabolic Panel: Recent Labs  Lab 03/28/22 1829 03/29/22 1828 03/30/22 0307 03/31/22 0548  NA 141 143 143 142  K 3.5 3.9 3.9 4.0  CL 111 108 111 109  CO2 '22 24 26 23  '$ GLUCOSE 241* 199* 184* 165*  BUN '10 8 8 9  '$ CREATININE 0.77  0.69 0.74 0.70  CALCIUM 8.0* 9.0 8.6* 8.8*  MG  --  1.5* 2.2 1.8  PHOS  --  3.2 3.0 3.0   Liver Function Tests: Recent Labs  Lab 03/28/22 1829 03/29/22 1828 03/30/22 0307 03/31/22 0548  AST '22 29 27 '$ 28  ALT '16 24 25 30  '$ ALKPHOS 63 71 74 67  BILITOT 0.2* 0.6 0.5 0.5  PROT 5.1* 6.2* 5.5* 5.3*  ALBUMIN 2.6* 3.2* 2.8* 2.8*   No results for input(s): "LIPASE", "AMYLASE" in the last 168 hours. No results for input(s): "AMMONIA" in the last 168 hours. CBC: Recent Labs  Lab 03/28/22 1829 03/29/22 0742 03/30/22 0307 03/31/22 0548  WBC 9.6 6.1 6.4 6.8  NEUTROABS 7.0  --  3.4 3.4  HGB 12.3 10.2* 12.5 11.5*  HCT 40.2 39.8 39.6 35.6*  MCV 92.4 112.4* 89.2 88.6  PLT 211 150 190 194   Cardiac Enzymes: No results for input(s): "CKTOTAL", "CKMB", "CKMBINDEX", "TROPONINI" in the last 168 hours. BNP: BNP (last 3 results) No results for input(s): "BNP" in the last 8760 hours.  ProBNP (last 3 results) Recent Labs    07/27/21 1509  PROBNP 26.0    CBG: Recent Labs  Lab 03/30/22 0729 03/30/22 1140 03/30/22 1619 03/30/22 2145 03/31/22 0727  GLUCAP 126* 186* 184* 234* 147*       Signed:  Nita Sells MD   [facility] 03/31/2022, 9:47 AM

## 2022-03-31 NOTE — Plan of Care (Signed)
  Problem: Metabolic: Goal: Ability to maintain appropriate glucose levels will improve Outcome: Not Progressing   Problem: Skin Integrity: Goal: Risk for impaired skin integrity will decrease Outcome: Not Progressing

## 2022-04-01 DIAGNOSIS — D513 Other dietary vitamin B12 deficiency anemia: Secondary | ICD-10-CM | POA: Diagnosis not present

## 2022-04-01 DIAGNOSIS — Z8673 Personal history of transient ischemic attack (TIA), and cerebral infarction without residual deficits: Secondary | ICD-10-CM | POA: Diagnosis not present

## 2022-04-01 DIAGNOSIS — I69951 Hemiplegia and hemiparesis following unspecified cerebrovascular disease affecting right dominant side: Secondary | ICD-10-CM | POA: Diagnosis not present

## 2022-04-01 DIAGNOSIS — E161 Other hypoglycemia: Secondary | ICD-10-CM | POA: Diagnosis not present

## 2022-04-01 DIAGNOSIS — E1149 Type 2 diabetes mellitus with other diabetic neurological complication: Secondary | ICD-10-CM | POA: Diagnosis not present

## 2022-04-01 DIAGNOSIS — G40201 Localization-related (focal) (partial) symptomatic epilepsy and epileptic syndromes with complex partial seizures, not intractable, with status epilepticus: Secondary | ICD-10-CM | POA: Diagnosis not present

## 2022-04-01 DIAGNOSIS — I10 Essential (primary) hypertension: Secondary | ICD-10-CM | POA: Diagnosis not present

## 2022-04-01 DIAGNOSIS — I5033 Acute on chronic diastolic (congestive) heart failure: Secondary | ICD-10-CM | POA: Diagnosis not present

## 2022-04-01 DIAGNOSIS — E441 Mild protein-calorie malnutrition: Secondary | ICD-10-CM | POA: Diagnosis not present

## 2022-04-02 LAB — CULTURE, BLOOD (ROUTINE X 2)
Culture: NO GROWTH
Culture: NO GROWTH

## 2022-04-03 DIAGNOSIS — E119 Type 2 diabetes mellitus without complications: Secondary | ICD-10-CM | POA: Diagnosis not present

## 2022-04-05 DIAGNOSIS — E441 Mild protein-calorie malnutrition: Secondary | ICD-10-CM | POA: Diagnosis not present

## 2022-04-05 DIAGNOSIS — M6281 Muscle weakness (generalized): Secondary | ICD-10-CM | POA: Diagnosis not present

## 2022-04-05 DIAGNOSIS — I69951 Hemiplegia and hemiparesis following unspecified cerebrovascular disease affecting right dominant side: Secondary | ICD-10-CM | POA: Diagnosis not present

## 2022-04-05 DIAGNOSIS — E1149 Type 2 diabetes mellitus with other diabetic neurological complication: Secondary | ICD-10-CM | POA: Diagnosis not present

## 2022-04-05 DIAGNOSIS — E161 Other hypoglycemia: Secondary | ICD-10-CM | POA: Diagnosis not present

## 2022-04-05 DIAGNOSIS — I10 Essential (primary) hypertension: Secondary | ICD-10-CM | POA: Diagnosis not present

## 2022-04-05 DIAGNOSIS — Z8673 Personal history of transient ischemic attack (TIA), and cerebral infarction without residual deficits: Secondary | ICD-10-CM | POA: Diagnosis not present

## 2022-04-05 DIAGNOSIS — D513 Other dietary vitamin B12 deficiency anemia: Secondary | ICD-10-CM | POA: Diagnosis not present

## 2022-04-05 DIAGNOSIS — G40201 Localization-related (focal) (partial) symptomatic epilepsy and epileptic syndromes with complex partial seizures, not intractable, with status epilepticus: Secondary | ICD-10-CM | POA: Diagnosis not present

## 2022-04-06 DIAGNOSIS — I10 Essential (primary) hypertension: Secondary | ICD-10-CM | POA: Diagnosis not present

## 2022-04-06 DIAGNOSIS — E441 Mild protein-calorie malnutrition: Secondary | ICD-10-CM | POA: Diagnosis not present

## 2022-04-06 DIAGNOSIS — G9341 Metabolic encephalopathy: Secondary | ICD-10-CM | POA: Diagnosis not present

## 2022-04-06 DIAGNOSIS — D513 Other dietary vitamin B12 deficiency anemia: Secondary | ICD-10-CM | POA: Diagnosis not present

## 2022-04-06 DIAGNOSIS — I5022 Chronic systolic (congestive) heart failure: Secondary | ICD-10-CM | POA: Diagnosis not present

## 2022-04-06 DIAGNOSIS — M6281 Muscle weakness (generalized): Secondary | ICD-10-CM | POA: Diagnosis not present

## 2022-04-06 DIAGNOSIS — E1149 Type 2 diabetes mellitus with other diabetic neurological complication: Secondary | ICD-10-CM | POA: Diagnosis not present

## 2022-04-06 DIAGNOSIS — E161 Other hypoglycemia: Secondary | ICD-10-CM | POA: Diagnosis not present

## 2022-04-06 DIAGNOSIS — I69951 Hemiplegia and hemiparesis following unspecified cerebrovascular disease affecting right dominant side: Secondary | ICD-10-CM | POA: Diagnosis not present

## 2022-04-07 DIAGNOSIS — G40201 Localization-related (focal) (partial) symptomatic epilepsy and epileptic syndromes with complex partial seizures, not intractable, with status epilepticus: Secondary | ICD-10-CM | POA: Diagnosis not present

## 2022-04-07 DIAGNOSIS — E119 Type 2 diabetes mellitus without complications: Secondary | ICD-10-CM | POA: Diagnosis not present

## 2022-04-07 DIAGNOSIS — I69951 Hemiplegia and hemiparesis following unspecified cerebrovascular disease affecting right dominant side: Secondary | ICD-10-CM | POA: Diagnosis not present

## 2022-04-07 DIAGNOSIS — I10 Essential (primary) hypertension: Secondary | ICD-10-CM | POA: Diagnosis not present

## 2022-04-07 DIAGNOSIS — Z8673 Personal history of transient ischemic attack (TIA), and cerebral infarction without residual deficits: Secondary | ICD-10-CM | POA: Diagnosis not present

## 2022-04-07 DIAGNOSIS — E161 Other hypoglycemia: Secondary | ICD-10-CM | POA: Diagnosis not present

## 2022-04-07 DIAGNOSIS — I5033 Acute on chronic diastolic (congestive) heart failure: Secondary | ICD-10-CM | POA: Diagnosis not present

## 2022-04-07 DIAGNOSIS — E559 Vitamin D deficiency, unspecified: Secondary | ICD-10-CM | POA: Diagnosis not present

## 2022-04-07 DIAGNOSIS — D513 Other dietary vitamin B12 deficiency anemia: Secondary | ICD-10-CM | POA: Diagnosis not present

## 2022-04-07 DIAGNOSIS — E1149 Type 2 diabetes mellitus with other diabetic neurological complication: Secondary | ICD-10-CM | POA: Diagnosis not present

## 2022-04-07 DIAGNOSIS — E441 Mild protein-calorie malnutrition: Secondary | ICD-10-CM | POA: Diagnosis not present

## 2022-04-08 DIAGNOSIS — I639 Cerebral infarction, unspecified: Secondary | ICD-10-CM | POA: Diagnosis not present

## 2022-04-08 DIAGNOSIS — R278 Other lack of coordination: Secondary | ICD-10-CM | POA: Diagnosis not present

## 2022-04-09 ENCOUNTER — Non-Acute Institutional Stay: Payer: Medicare PPO | Admitting: Student

## 2022-04-09 DIAGNOSIS — Z515 Encounter for palliative care: Secondary | ICD-10-CM

## 2022-04-09 DIAGNOSIS — E441 Mild protein-calorie malnutrition: Secondary | ICD-10-CM | POA: Diagnosis not present

## 2022-04-09 DIAGNOSIS — R278 Other lack of coordination: Secondary | ICD-10-CM | POA: Diagnosis not present

## 2022-04-09 DIAGNOSIS — M79621 Pain in right upper arm: Secondary | ICD-10-CM | POA: Diagnosis not present

## 2022-04-09 DIAGNOSIS — Z8673 Personal history of transient ischemic attack (TIA), and cerebral infarction without residual deficits: Secondary | ICD-10-CM | POA: Diagnosis not present

## 2022-04-09 DIAGNOSIS — M21221 Flexion deformity, right elbow: Secondary | ICD-10-CM | POA: Diagnosis not present

## 2022-04-09 DIAGNOSIS — I10 Essential (primary) hypertension: Secondary | ICD-10-CM | POA: Diagnosis not present

## 2022-04-09 DIAGNOSIS — M799 Soft tissue disorder, unspecified: Secondary | ICD-10-CM | POA: Diagnosis not present

## 2022-04-09 DIAGNOSIS — L03113 Cellulitis of right upper limb: Secondary | ICD-10-CM

## 2022-04-09 DIAGNOSIS — E161 Other hypoglycemia: Secondary | ICD-10-CM | POA: Diagnosis not present

## 2022-04-09 DIAGNOSIS — R52 Pain, unspecified: Secondary | ICD-10-CM

## 2022-04-09 DIAGNOSIS — I6930 Unspecified sequelae of cerebral infarction: Secondary | ICD-10-CM

## 2022-04-09 DIAGNOSIS — M25521 Pain in right elbow: Secondary | ICD-10-CM | POA: Diagnosis not present

## 2022-04-09 DIAGNOSIS — I69951 Hemiplegia and hemiparesis following unspecified cerebrovascular disease affecting right dominant side: Secondary | ICD-10-CM | POA: Diagnosis not present

## 2022-04-09 DIAGNOSIS — R600 Localized edema: Secondary | ICD-10-CM

## 2022-04-09 DIAGNOSIS — I5033 Acute on chronic diastolic (congestive) heart failure: Secondary | ICD-10-CM | POA: Diagnosis not present

## 2022-04-09 DIAGNOSIS — I639 Cerebral infarction, unspecified: Secondary | ICD-10-CM | POA: Diagnosis not present

## 2022-04-09 DIAGNOSIS — D513 Other dietary vitamin B12 deficiency anemia: Secondary | ICD-10-CM | POA: Diagnosis not present

## 2022-04-09 DIAGNOSIS — R630 Anorexia: Secondary | ICD-10-CM

## 2022-04-09 DIAGNOSIS — E1149 Type 2 diabetes mellitus with other diabetic neurological complication: Secondary | ICD-10-CM | POA: Diagnosis not present

## 2022-04-09 DIAGNOSIS — G40201 Localization-related (focal) (partial) symptomatic epilepsy and epileptic syndromes with complex partial seizures, not intractable, with status epilepticus: Secondary | ICD-10-CM | POA: Diagnosis not present

## 2022-04-10 DIAGNOSIS — R278 Other lack of coordination: Secondary | ICD-10-CM | POA: Diagnosis not present

## 2022-04-10 DIAGNOSIS — I639 Cerebral infarction, unspecified: Secondary | ICD-10-CM | POA: Diagnosis not present

## 2022-04-12 ENCOUNTER — Telehealth: Payer: Self-pay | Admitting: Student

## 2022-04-12 DIAGNOSIS — G40201 Localization-related (focal) (partial) symptomatic epilepsy and epileptic syndromes with complex partial seizures, not intractable, with status epilepticus: Secondary | ICD-10-CM | POA: Diagnosis not present

## 2022-04-12 DIAGNOSIS — D513 Other dietary vitamin B12 deficiency anemia: Secondary | ICD-10-CM | POA: Diagnosis not present

## 2022-04-12 DIAGNOSIS — Z8673 Personal history of transient ischemic attack (TIA), and cerebral infarction without residual deficits: Secondary | ICD-10-CM | POA: Diagnosis not present

## 2022-04-12 DIAGNOSIS — E161 Other hypoglycemia: Secondary | ICD-10-CM | POA: Diagnosis not present

## 2022-04-12 DIAGNOSIS — M6281 Muscle weakness (generalized): Secondary | ICD-10-CM | POA: Diagnosis not present

## 2022-04-12 DIAGNOSIS — I10 Essential (primary) hypertension: Secondary | ICD-10-CM | POA: Diagnosis not present

## 2022-04-12 DIAGNOSIS — E1149 Type 2 diabetes mellitus with other diabetic neurological complication: Secondary | ICD-10-CM | POA: Diagnosis not present

## 2022-04-12 DIAGNOSIS — E441 Mild protein-calorie malnutrition: Secondary | ICD-10-CM | POA: Diagnosis not present

## 2022-04-12 DIAGNOSIS — I69951 Hemiplegia and hemiparesis following unspecified cerebrovascular disease affecting right dominant side: Secondary | ICD-10-CM | POA: Diagnosis not present

## 2022-04-12 NOTE — Progress Notes (Unsigned)
[facility] Pall[facility] Consult Note Telephone: [phone]  Fax: [phone]   Date of encounter: 04/09/2022 12:06 PM PATIENT NAME: [name] [address] [place] [address]   [phone] (home)  DOB: October 09, 1945 MRN: [id number] PRIMARY CARE PROVIDER:    Biagio Borg, MD,  [place] [address] [phone]  REFERRING PROVIDER:   Vilinda Boehringer, NP  RESPONSIBLE PARTY:    Contact Information     Name Relation Home Work Mobile   [name] Son [place] Son   [phone]        I met face to face with patient in the facility. Palliative Care was asked to follow this patient by consultation request of  Melissa Boehringer, NP to address advance care planning and complex medical decision making. This is the initial visit.                                     ASSESSMENT AND PLAN / RECOMMENDATIONS:   Advance Care Planning/Goals of Care: Goals include to maximize quality of life and symptom management. Patient/health care surrogate gave his/her permission to discuss.Our advance care planning conversation included a discussion about:    The value and importance of advance care planning  Experiences with loved ones who have been seriously ill or have died  Exploration of personal, cultural or spiritual beliefs that might influence medical decisions  Exploration of goals of care in the event of a sudden injury or illness   CODE STATUS: Full Code   Spoke with patient's son [name] to review goals of care. He would like to take patient home at some point, but has no certain time frame in mind. He expressed some caregiver fatigue. He does have equipment in the home for patient. We discussed in home caregivers; will email him some recommendations. We reviewed MOST form on file; no changes. Palliative will continue to provide supportive care, symptom management as needed.   Symptom  Management/Plan:  Late Effect CVA with right sided hemiplegia-Patient with spastic hemiplegia, dysphagia. She has generalized weakness. Patient dependent for adl's. Staff to continue assisting with adl's. Patient to receive therapy as able to tolerate. Monitor for worsening dysphagia. Encourage patient to sit up to bedside or get out of bed daily.   Pain, edema to right arm-patient with worsening pain and edema to right arm-patient started on cephalexin x 5 days for cellulitis. Staff to elevate arm on pillows. PCP ordered an x-ray given worsening pain. Continue acetaminophen as directed.   Appetite-patient with decline in appetite since moving to facility. Boost glucose control supplement BID. Offer foods patient enjoys. Patient weight is 151 pounds; she has lost weight from 167.8 pounds. Son [name] states that the 151 is actually her healthy weight. Will continue to monitor weights, routine weights per facility.   Follow up Palliative Care Visit: Palliative care will continue to follow for complex medical decision making, advance care planning, and clarification of goals. Return in 4 weeks or prn.   This visit was coded based on medical decision making (MDM).  PPS: 30%  HOSPICE ELIGIBILITY/DIAGNOSIS: TBD  Chief Complaint: Palliative Medicine initial consult.   HISTORY OF PRESENT ILLNESS:  [name] is a 76 y.o. year old female  with CHF, hx of CVA, spastic hemiplegia affecting right side, dysphagia, CAD, T2DM, hypertension, hyperlipidemia, macrocytic anemia.  Patient resides  at [facility]. She is complaining of worsening right sided pain and increased swelling to right arm. She was seen by PCP today and started on antibiotics for cellulitis; x-ray also ordered. She denies shortness of breath. She does endorse decline in appetite. Her blood sugars have been better controlled since returning to facility and adjustments to her insulin. She is dependent for adl's. Patient does  express wanting to return home. Son [name] has taken care of patient in the home x past 7 years. Patient received resting in bed. She does answer questions; she appears uncomfortable with her right arm pain. Staff to administer tylenol.   History obtained from review of EMR, discussion with primary team, and interview with family, facility staff/caregiver and/or [name].  I reviewed available labs, medications, imaging, studies and related documents from the EMR.  Records reviewed and summarized above.   ROS  A 10-Point ROS is negative, except for the pertinent positives and negatives detailed per the HPI.   Physical Exam: Pulse 80, resp 20, sats 98% on room air Constitutional: NAD General: frail appearing EYES: anicteric sclera, lids intact, no discharge  ENMT: intact hearing, oral mucous membranes moist CV: S1S2, RRR, no LE edema Pulmonary: LCTA, no increased work of breathing, no cough, room air Abdomen: normo-active BS + 4 quadrants, soft and non tender GU: deferred MSK: right sided hemiparesis, non- ambulatory Skin: warm and dry, no rashes or wounds on visible skin Neuro:  + generalized weakness Psych: non-anxious affect, A and O x 3 Hem/lymph/immuno: no widespread bruising CURRENT PROBLEM LIST:  Patient Active Problem List   Diagnosis Date Noted   Hypoglycemia 97/28/2060   Acute metabolic encephalopathy 15/61/5379   UTI (urinary tract infection) 03/28/2022   Encounter for well adult exam with abnormal findings 07/27/2021   HLD (hyperlipidemia) 04/14/2021   Vitamin D deficiency 04/14/2021   B12 deficiency 04/14/2021   Rupture of left biceps tendon 06/19/2020   Goals of care, counseling/discussion    Palliative care by specialist    DNR (do not resuscitate) discussion    Sepsis without acute organ dysfunction ([place]) 04/05/2020   AKI (acute kidney injury) ([place]) 04/05/2020   Hyperkalemia 04/05/2020   Abnormal LFTs 04/05/2020   Mild protein malnutrition ([place]) 04/05/2020    Grade I diastolic dysfunction 43/27/6147   Closed fracture of right distal femur ([place]) 04/05/2020   Prolonged QT interval 04/05/2020   Dysuria 06/12/2019   Preventative health care 08/18/2018   Partial symptomatic epilepsy with complex partial seizures, not intractable, without status epilepticus ([place]) 12/08/2017   Shingles 10/26/2017   Constipation 04/20/2017   Skin ulcer of right heel, limited to breakdown of skin ([place]) 02/10/2017   Muscle spasticity 01/20/2016   Bilateral lower extremity edema 12/23/2015   Acute confusional state 10/28/2015   Right spastic hemiparesis ([place]) 10/07/2015   Right shoulder pain 07/24/2015   Fracture, intertrochanteric, right femur ([place]) 06/10/2015   Stercoral ulcer of rectum 05/16/2015   GI bleed 05/13/2015   BRBPR (bright red blood per rectum) 05/13/2015   CHF (congestive heart failure) (HCC)    Diabetes (HCC)    Hypertension    History of stroke    Lower GI bleed    Left-sided cerebrovascular accident (CVA) ([place]) 04/28/2015   PAST MEDICAL HISTORY:  Active Ambulatory Problems    Diagnosis Date Noted   Left-sided cerebrovascular accident (CVA) ([place]) 04/28/2015   GI bleed 05/13/2015   BRBPR (bright red blood per rectum) 05/13/2015   CHF (congestive heart failure) ([place])  Diabetes ([place])    Hypertension    History of stroke    Lower GI bleed    Stercoral ulcer of rectum 05/16/2015   Fracture, intertrochanteric, right femur ([place]) 06/10/2015   Right shoulder pain 07/24/2015   Right spastic hemiparesis ([place]) 10/07/2015   Acute confusional state 10/28/2015   Bilateral lower extremity edema 12/23/2015   Muscle spasticity 01/20/2016   Skin ulcer of right heel, limited to breakdown of skin ([place]) 02/10/2017   Constipation 04/20/2017   Shingles 10/26/2017   Partial symptomatic epilepsy with complex partial seizures, not intractable, without status epilepticus ([place]) 12/08/2017   Preventative health care 08/18/2018   Dysuria 06/12/2019   Sepsis  without acute organ dysfunction ([place]) 04/05/2020   AKI (acute kidney injury) ([place]) 04/05/2020   Hyperkalemia 04/05/2020   Abnormal LFTs 04/05/2020   Mild protein malnutrition ([place]) 04/05/2020   Grade I diastolic dysfunction 58/03/9832   Closed fracture of right distal femur ([place]) 04/05/2020   Prolonged QT interval 04/05/2020   Goals of care, counseling/discussion    Palliative care by specialist    DNR (do not resuscitate) discussion    Rupture of left biceps tendon 06/19/2020   HLD (hyperlipidemia) 04/14/2021   Vitamin D deficiency 04/14/2021   B12 deficiency 04/14/2021   Encounter for well adult exam with abnormal findings 07/27/2021   Hypoglycemia 82/50/5397   Acute metabolic encephalopathy 67/34/1937   UTI (urinary tract infection) 03/28/2022   Resolved Ambulatory Problems    Diagnosis Date Noted   GI bleeding 05/15/2015   Past Medical History:  Diagnosis Date   Acute blood loss anemia    Coronary artery disease involving native artery of transplanted heart without angina pectoris    CVA (cerebral infarction) 04/2015   Depression    Enchondroma of bone 2011   History of lower GI bleeding    Hyperlipidemia    Patent foramen ovale 04/2015   Protein calorie malnutrition ([place])    SOCIAL HX:  Social History   Tobacco Use   Smoking status: Former    Types: Cigarettes    Quit date: 04/05/2015    Years since quitting: 7.0   Smokeless tobacco: Never   Tobacco comments:    Quit 04/28/15  Substance Use Topics   Alcohol use: No    Alcohol/week: 0.0 standard drinks of alcohol   FAMILY HX:  Family History  Problem Relation Age of Onset   Hypertension Mother    Heart failure Mother    Hyperlipidemia Mother    Heart attack Father       ALLERGIES:  Allergies  Allergen Reactions   Pork-Derived Products Other (See Comments)    To keep blood pressure down     PERTINENT MEDICATIONS:  Outpatient Encounter Medications as of 04/09/2022  Medication Sig   alum & mag  hydroxide-simeth (MYLANTA MAXIMUM STRENGTH) 400-400-40 MG/5ML suspension Take 30 mLs by mouth 3 (three) times daily as needed for indigestion. (Not to exceed 3 doses daily). Inform MD if used more than 3 days. **Do not give at same time as antibiotics, Digoxin or Coumadin**   atorvastatin (LIPITOR) 40 MG tablet Take 0.5 tablets (20 mg total) by mouth daily.   bisacodyl (DULCOLAX) 10 MG suppository Place 10 mg rectally as needed for moderate constipation. If no bowel movement within 72 hours or Milk of Magnesia, may give 1 suppository rectally (or 1 tablet) as needed for constipation and inform MD.   bisacodyl (DULCOLAX) 5 MG EC tablet Take 5 mg by mouth as needed for moderate  constipation. If no bowel movement within 72 hours or Milk of Magnesia, may give 1 tablet by mouth (or 1 suppository) as needed for constipation and inform MD.   ELIQUIS 5 MG TABS tablet Take 5 mg by mouth 2 (two) times daily.    insulin detemir (LEVEMIR) 100 UNIT/ML injection INJECT 12 UNITS SUBCUTANEOUSLY twice daily   levETIRAcetam (KEPPRA) 500 MG tablet Take 1 tablet (500 mg total) by mouth 2 (two) times daily.   loperamide (IMODIUM A-D) 2 MG tablet Take 2-4 mg by mouth See admin instructions. Give 2 tablets (4 mg) by mouth at onset of diarrhea, then 1 tablet (2 mg) after each stool. (Not to exceed 16 mg total). If diarrhea persists for 36 hours notify MD.   losartan (COZAAR) 25 MG tablet Take 12.5 mg by mouth daily.    magnesium hydroxide (MILK OF MAGNESIA) 400 MG/5ML suspension Take 30 mLs by mouth 2 (two) times daily as needed for mild constipation. Use Miralax if Milk of Magnesia is contraindicated (example: Dialysis resident)   metFORMIN (GLUCOPHAGE-XR) 750 MG 24 hr tablet Take 1 tablet (750 mg total) by mouth daily with breakfast.   metoprolol succinate (TOPROL-XL) 25 MG 24 hr tablet Take 25 mg by mouth daily.    nystatin cream (MYCOSTATIN) Apply 1 Application topically See admin instructions. Apply topically 2 times a day  for cutaneous or mucocutaneous candida rash of abdomen folds, groin and under breasts for 21 days. D/C order if rash is healed after 21 days. **Update MD if rash not resolved at that time**   Phenylephrine-DM-GG (ROBITUSSIN COUGH/COLD CF PO) Take 10 mLs by mouth every 4 (four) hours as needed (Cough). **Notify MD if cough persists, congestion x3 days or accompanied by fever**   polyethylene glycol (MIRALAX / GLYCOLAX) 17 g packet Take 17 g by mouth daily as needed for mild constipation or moderate constipation. Use Miralax if Milk of Magnesia is contraindicated (Example: Dialysis resident)   promethazine (PHENERGAN) 25 MG tablet Take 25 mg by mouth every 4 (four) hours as needed for nausea or vomiting. May use suppository if unable to take by mouth. Notify MD if nausea persists after second dose.   No facility-administered encounter medications on file as of 04/09/2022.   Thank you for the opportunity to participate in the care of [name].  The palliative care team will continue to follow. Please call our office at [phone] if we can be of additional assistance.   Ezekiel Slocumb, NP  COVID-19 PATIENT SCREENING TOOL Asked and negative response unless otherwise noted:  Have you had symptoms of covid, tested positive or been in contact with someone with symptoms/positive test in the past 5-10 days? No

## 2022-04-12 NOTE — Telephone Encounter (Signed)
Palliative NP left message for son [name] to discuss recent palliative visit. Awaiting return call.

## 2022-04-13 DIAGNOSIS — I639 Cerebral infarction, unspecified: Secondary | ICD-10-CM | POA: Diagnosis not present

## 2022-04-13 DIAGNOSIS — R278 Other lack of coordination: Secondary | ICD-10-CM | POA: Diagnosis not present

## 2022-04-14 DIAGNOSIS — I639 Cerebral infarction, unspecified: Secondary | ICD-10-CM | POA: Diagnosis not present

## 2022-04-14 DIAGNOSIS — R278 Other lack of coordination: Secondary | ICD-10-CM | POA: Diagnosis not present

## 2022-04-15 DIAGNOSIS — E441 Mild protein-calorie malnutrition: Secondary | ICD-10-CM | POA: Diagnosis not present

## 2022-04-15 DIAGNOSIS — E1149 Type 2 diabetes mellitus with other diabetic neurological complication: Secondary | ICD-10-CM | POA: Diagnosis not present

## 2022-04-15 DIAGNOSIS — E161 Other hypoglycemia: Secondary | ICD-10-CM | POA: Diagnosis not present

## 2022-04-15 DIAGNOSIS — Z8673 Personal history of transient ischemic attack (TIA), and cerebral infarction without residual deficits: Secondary | ICD-10-CM | POA: Diagnosis not present

## 2022-04-15 DIAGNOSIS — I69951 Hemiplegia and hemiparesis following unspecified cerebrovascular disease affecting right dominant side: Secondary | ICD-10-CM | POA: Diagnosis not present

## 2022-04-15 DIAGNOSIS — R278 Other lack of coordination: Secondary | ICD-10-CM | POA: Diagnosis not present

## 2022-04-15 DIAGNOSIS — I10 Essential (primary) hypertension: Secondary | ICD-10-CM | POA: Diagnosis not present

## 2022-04-15 DIAGNOSIS — I639 Cerebral infarction, unspecified: Secondary | ICD-10-CM | POA: Diagnosis not present

## 2022-04-15 DIAGNOSIS — G40201 Localization-related (focal) (partial) symptomatic epilepsy and epileptic syndromes with complex partial seizures, not intractable, with status epilepticus: Secondary | ICD-10-CM | POA: Diagnosis not present

## 2022-04-15 DIAGNOSIS — M6281 Muscle weakness (generalized): Secondary | ICD-10-CM | POA: Diagnosis not present

## 2022-04-15 DIAGNOSIS — D513 Other dietary vitamin B12 deficiency anemia: Secondary | ICD-10-CM | POA: Diagnosis not present

## 2022-04-16 DIAGNOSIS — R278 Other lack of coordination: Secondary | ICD-10-CM | POA: Diagnosis not present

## 2022-04-16 DIAGNOSIS — I639 Cerebral infarction, unspecified: Secondary | ICD-10-CM | POA: Diagnosis not present

## 2022-04-19 DIAGNOSIS — R278 Other lack of coordination: Secondary | ICD-10-CM | POA: Diagnosis not present

## 2022-04-19 DIAGNOSIS — I639 Cerebral infarction, unspecified: Secondary | ICD-10-CM | POA: Diagnosis not present

## 2022-04-21 DIAGNOSIS — I639 Cerebral infarction, unspecified: Secondary | ICD-10-CM | POA: Diagnosis not present

## 2022-04-21 DIAGNOSIS — R278 Other lack of coordination: Secondary | ICD-10-CM | POA: Diagnosis not present

## 2022-04-22 DIAGNOSIS — I639 Cerebral infarction, unspecified: Secondary | ICD-10-CM | POA: Diagnosis not present

## 2022-04-22 DIAGNOSIS — R278 Other lack of coordination: Secondary | ICD-10-CM | POA: Diagnosis not present

## 2022-04-27 DIAGNOSIS — I639 Cerebral infarction, unspecified: Secondary | ICD-10-CM | POA: Diagnosis not present

## 2022-04-27 DIAGNOSIS — R278 Other lack of coordination: Secondary | ICD-10-CM | POA: Diagnosis not present

## 2022-04-28 DIAGNOSIS — R278 Other lack of coordination: Secondary | ICD-10-CM | POA: Diagnosis not present

## 2022-04-28 DIAGNOSIS — I639 Cerebral infarction, unspecified: Secondary | ICD-10-CM | POA: Diagnosis not present

## 2022-04-29 DIAGNOSIS — I639 Cerebral infarction, unspecified: Secondary | ICD-10-CM | POA: Diagnosis not present

## 2022-04-29 DIAGNOSIS — R278 Other lack of coordination: Secondary | ICD-10-CM | POA: Diagnosis not present

## 2022-04-29 DIAGNOSIS — Z23 Encounter for immunization: Secondary | ICD-10-CM | POA: Diagnosis not present

## 2022-04-30 DIAGNOSIS — G40201 Localization-related (focal) (partial) symptomatic epilepsy and epileptic syndromes with complex partial seizures, not intractable, with status epilepticus: Secondary | ICD-10-CM | POA: Diagnosis not present

## 2022-04-30 DIAGNOSIS — I10 Essential (primary) hypertension: Secondary | ICD-10-CM | POA: Diagnosis not present

## 2022-04-30 DIAGNOSIS — G9341 Metabolic encephalopathy: Secondary | ICD-10-CM | POA: Diagnosis not present

## 2022-04-30 DIAGNOSIS — I69951 Hemiplegia and hemiparesis following unspecified cerebrovascular disease affecting right dominant side: Secondary | ICD-10-CM | POA: Diagnosis not present

## 2022-04-30 DIAGNOSIS — I5033 Acute on chronic diastolic (congestive) heart failure: Secondary | ICD-10-CM | POA: Diagnosis not present

## 2022-04-30 DIAGNOSIS — R278 Other lack of coordination: Secondary | ICD-10-CM | POA: Diagnosis not present

## 2022-04-30 DIAGNOSIS — E161 Other hypoglycemia: Secondary | ICD-10-CM | POA: Diagnosis not present

## 2022-04-30 DIAGNOSIS — D513 Other dietary vitamin B12 deficiency anemia: Secondary | ICD-10-CM | POA: Diagnosis not present

## 2022-04-30 DIAGNOSIS — E1149 Type 2 diabetes mellitus with other diabetic neurological complication: Secondary | ICD-10-CM | POA: Diagnosis not present

## 2022-04-30 DIAGNOSIS — I639 Cerebral infarction, unspecified: Secondary | ICD-10-CM | POA: Diagnosis not present

## 2022-04-30 DIAGNOSIS — E441 Mild protein-calorie malnutrition: Secondary | ICD-10-CM | POA: Diagnosis not present

## 2022-05-05 ENCOUNTER — Non-Acute Institutional Stay: Payer: Medicare PPO | Admitting: Student

## 2022-05-05 DIAGNOSIS — Z515 Encounter for palliative care: Secondary | ICD-10-CM

## 2022-05-05 DIAGNOSIS — I6930 Unspecified sequelae of cerebral infarction: Secondary | ICD-10-CM

## 2022-05-05 DIAGNOSIS — R52 Pain, unspecified: Secondary | ICD-10-CM

## 2022-05-05 DIAGNOSIS — R41841 Cognitive communication deficit: Secondary | ICD-10-CM | POA: Diagnosis not present

## 2022-05-05 DIAGNOSIS — R630 Anorexia: Secondary | ICD-10-CM

## 2022-05-05 DIAGNOSIS — R278 Other lack of coordination: Secondary | ICD-10-CM | POA: Diagnosis not present

## 2022-05-05 DIAGNOSIS — I639 Cerebral infarction, unspecified: Secondary | ICD-10-CM | POA: Diagnosis not present

## 2022-05-05 DIAGNOSIS — M7989 Other specified soft tissue disorders: Secondary | ICD-10-CM

## 2022-05-05 NOTE — Progress Notes (Signed)
[facility] Consult Note Telephone: [phone]  Fax: [phone]    Date of encounter: 05/05/22  PATIENT NAME: [name] [address] [place] [address]   [phone] (home)  DOB: Sep 09, 1945 MRN: [id number] PRIMARY CARE PROVIDER:    Biagio Borg, MD,  [place] [address] [phone]  REFERRING PROVIDER:   Biagio Borg, MD [address] [place],  [place] [address] [phone]  RESPONSIBLE PARTY:    Contact Information     Name Relation Home Work Mobile   [place] Son [place] Son   [phone]        I met face to face with patient in the facility. Palliative Care was asked to follow this patient by consultation request of Vilinda Boehringer, NP  to address advance care planning and complex medical decision making. This is a follow up visit.                                   ASSESSMENT AND PLAN / RECOMMENDATIONS:   Advance Care Planning/Goals of Care: Goals include to maximize quality of life and symptom management. Patient/health care surrogate gave his/her permission to discuss. Our advance care planning conversation included a discussion about:    The value and importance of advance care planning  Experiences with loved ones who have been seriously ill or have died  Exploration of personal, cultural or spiritual beliefs that might influence medical decisions  Exploration of goals of care in the event of a sudden injury or illness  CODE STATUS: FULL CODE  Education provided on Palliative Medicine. Will continue to provide supportive care. Spoke with patient's son [name] to provide an update.  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 She has generalized weakness. Patient dependent for adl's. She is to be out of bed MWF. Staff to continue assisting with adl's. Monitor for  worsening dysphagia.   Pain, edema to right arm-patient with right arm edema. Arm elevated on pillow. X-ray was negative. Continue acetaminophen every 12 hours PRN. Staff to elevate arm on pillows. PCP ordered an x-ray given worsening pain. Continue acetaminophen as directed.   Appetite-fair appetite. Weight 150.2 pounds; stable. Continue boost glucose control supplement BID. Routine weights per facility.  Follow up Palliative Care Visit: Palliative care will continue to follow for complex medical decision making, advance care planning, and clarification of goals. Return in 6-8 weeks or prn.  This visit was coded based on medical decision making (MDM).  PPS: 30%  HOSPICE ELIGIBILITY/DIAGNOSIS: TBD  Chief Complaint: Palliative Medicine follow up visit.   HISTORY OF PRESENT ILLNESS:  [name] is a 76 y.o. year old female  with CHF, hx of CVA, spastic hemiplegia affecting right side, dysphagia, CAD, T2DM, hypertension, hyperlipidemia, macrocytic anemia.   Patient resides at [facility]. Patient reports having right arm pain. She has been getting out of the bed some; plan is to get OOB MWF. No hypoglycemic episodes reported. Appetite has been fair; she is also receiving boost glucose supplement BID. No falls, recent infections, ED visits.  History obtained from review of EMR, discussion with primary team, and interview with family, facility staff/caregiver and/or Ms. Bruening.  I reviewed available labs, medications, imaging, studies and related documents from the EMR.  Records reviewed and summarized above.  ROS  A 10-Point ROS is negative, except for the pertinent positives and negatives detailed per the HPI.   Physical Exam: Weight: 150.2 pounds Pulse 60, resp 16, sats 96% on room air Constitutional: NAD General: frail appearing EYES: anicteric sclera, lids intact, no discharge  ENMT: intact hearing, oral mucous membranes moist CV: S1S2, RRR Pulmonary: LCTA,  no increased work of breathing, no cough, room air Abdomen: normo-active BS + 4 quadrants, soft and non tender, no ascites GU: deferred MSK: right sided hemiparesis, non- ambulatory Skin: warm and dry, no rashes or wounds on visible skin, right arm and right ankle edema Neuro: +generalized weakness Psych: non-anxious affect, A and O x 3 Hem/lymph/immuno: no widespread bruising   Thank you for the opportunity to participate in the care of [name].  The palliative care team will continue to follow. Please call our office at [phone] if we can be of additional assistance.   Ezekiel Slocumb, NP   COVID-19 PATIENT SCREENING TOOL Asked and negative response unless otherwise noted:   Have you had symptoms of covid, tested positive or been in contact with someone with symptoms/positive test in the past 5-10 days? No

## 2022-05-06 DIAGNOSIS — R41841 Cognitive communication deficit: Secondary | ICD-10-CM | POA: Diagnosis not present

## 2022-05-06 DIAGNOSIS — I639 Cerebral infarction, unspecified: Secondary | ICD-10-CM | POA: Diagnosis not present

## 2022-05-06 DIAGNOSIS — R278 Other lack of coordination: Secondary | ICD-10-CM | POA: Diagnosis not present

## 2022-05-11 DIAGNOSIS — I69951 Hemiplegia and hemiparesis following unspecified cerebrovascular disease affecting right dominant side: Secondary | ICD-10-CM | POA: Diagnosis not present

## 2022-05-11 DIAGNOSIS — G40209 Localization-related (focal) (partial) symptomatic epilepsy and epileptic syndromes with complex partial seizures, not intractable, without status epilepticus: Secondary | ICD-10-CM | POA: Diagnosis not present

## 2022-05-11 DIAGNOSIS — E1149 Type 2 diabetes mellitus with other diabetic neurological complication: Secondary | ICD-10-CM | POA: Diagnosis not present

## 2022-05-11 DIAGNOSIS — I10 Essential (primary) hypertension: Secondary | ICD-10-CM | POA: Diagnosis not present

## 2022-05-11 DIAGNOSIS — R278 Other lack of coordination: Secondary | ICD-10-CM | POA: Diagnosis not present

## 2022-05-11 DIAGNOSIS — I639 Cerebral infarction, unspecified: Secondary | ICD-10-CM | POA: Diagnosis not present

## 2022-05-11 DIAGNOSIS — I5032 Chronic diastolic (congestive) heart failure: Secondary | ICD-10-CM | POA: Diagnosis not present

## 2022-05-11 DIAGNOSIS — M6281 Muscle weakness (generalized): Secondary | ICD-10-CM | POA: Diagnosis not present

## 2022-05-11 DIAGNOSIS — R41841 Cognitive communication deficit: Secondary | ICD-10-CM | POA: Diagnosis not present

## 2022-05-12 DIAGNOSIS — R278 Other lack of coordination: Secondary | ICD-10-CM | POA: Diagnosis not present

## 2022-05-12 DIAGNOSIS — R41841 Cognitive communication deficit: Secondary | ICD-10-CM | POA: Diagnosis not present

## 2022-05-12 DIAGNOSIS — I639 Cerebral infarction, unspecified: Secondary | ICD-10-CM | POA: Diagnosis not present

## 2022-05-13 DIAGNOSIS — I639 Cerebral infarction, unspecified: Secondary | ICD-10-CM | POA: Diagnosis not present

## 2022-05-13 DIAGNOSIS — R41841 Cognitive communication deficit: Secondary | ICD-10-CM | POA: Diagnosis not present

## 2022-05-13 DIAGNOSIS — R278 Other lack of coordination: Secondary | ICD-10-CM | POA: Diagnosis not present

## 2022-05-18 DIAGNOSIS — I639 Cerebral infarction, unspecified: Secondary | ICD-10-CM | POA: Diagnosis not present

## 2022-05-18 DIAGNOSIS — R41841 Cognitive communication deficit: Secondary | ICD-10-CM | POA: Diagnosis not present

## 2022-05-18 DIAGNOSIS — R278 Other lack of coordination: Secondary | ICD-10-CM | POA: Diagnosis not present

## 2022-05-19 DIAGNOSIS — R278 Other lack of coordination: Secondary | ICD-10-CM | POA: Diagnosis not present

## 2022-05-19 DIAGNOSIS — R41841 Cognitive communication deficit: Secondary | ICD-10-CM | POA: Diagnosis not present

## 2022-05-19 DIAGNOSIS — I639 Cerebral infarction, unspecified: Secondary | ICD-10-CM | POA: Diagnosis not present

## 2022-05-20 DIAGNOSIS — R278 Other lack of coordination: Secondary | ICD-10-CM | POA: Diagnosis not present

## 2022-05-20 DIAGNOSIS — I639 Cerebral infarction, unspecified: Secondary | ICD-10-CM | POA: Diagnosis not present

## 2022-05-20 DIAGNOSIS — R41841 Cognitive communication deficit: Secondary | ICD-10-CM | POA: Diagnosis not present

## 2022-05-21 DIAGNOSIS — R41841 Cognitive communication deficit: Secondary | ICD-10-CM | POA: Diagnosis not present

## 2022-05-21 DIAGNOSIS — I639 Cerebral infarction, unspecified: Secondary | ICD-10-CM | POA: Diagnosis not present

## 2022-05-21 DIAGNOSIS — R278 Other lack of coordination: Secondary | ICD-10-CM | POA: Diagnosis not present

## 2022-05-25 DIAGNOSIS — R41841 Cognitive communication deficit: Secondary | ICD-10-CM | POA: Diagnosis not present

## 2022-05-25 DIAGNOSIS — I639 Cerebral infarction, unspecified: Secondary | ICD-10-CM | POA: Diagnosis not present

## 2022-05-25 DIAGNOSIS — R278 Other lack of coordination: Secondary | ICD-10-CM | POA: Diagnosis not present

## 2022-05-31 DIAGNOSIS — I69951 Hemiplegia and hemiparesis following unspecified cerebrovascular disease affecting right dominant side: Secondary | ICD-10-CM | POA: Diagnosis not present

## 2022-05-31 DIAGNOSIS — I10 Essential (primary) hypertension: Secondary | ICD-10-CM | POA: Diagnosis not present

## 2022-05-31 DIAGNOSIS — E441 Mild protein-calorie malnutrition: Secondary | ICD-10-CM | POA: Diagnosis not present

## 2022-05-31 DIAGNOSIS — I5032 Chronic diastolic (congestive) heart failure: Secondary | ICD-10-CM | POA: Diagnosis not present

## 2022-05-31 DIAGNOSIS — D513 Other dietary vitamin B12 deficiency anemia: Secondary | ICD-10-CM | POA: Diagnosis not present

## 2022-05-31 DIAGNOSIS — M6281 Muscle weakness (generalized): Secondary | ICD-10-CM | POA: Diagnosis not present

## 2022-05-31 DIAGNOSIS — G40201 Localization-related (focal) (partial) symptomatic epilepsy and epileptic syndromes with complex partial seizures, not intractable, with status epilepticus: Secondary | ICD-10-CM | POA: Diagnosis not present

## 2022-05-31 DIAGNOSIS — G40209 Localization-related (focal) (partial) symptomatic epilepsy and epileptic syndromes with complex partial seizures, not intractable, without status epilepticus: Secondary | ICD-10-CM | POA: Diagnosis not present

## 2022-05-31 DIAGNOSIS — E1149 Type 2 diabetes mellitus with other diabetic neurological complication: Secondary | ICD-10-CM | POA: Diagnosis not present

## 2022-06-08 DIAGNOSIS — E1149 Type 2 diabetes mellitus with other diabetic neurological complication: Secondary | ICD-10-CM | POA: Diagnosis not present

## 2022-06-08 DIAGNOSIS — I10 Essential (primary) hypertension: Secondary | ICD-10-CM | POA: Diagnosis not present

## 2022-06-08 DIAGNOSIS — I5032 Chronic diastolic (congestive) heart failure: Secondary | ICD-10-CM | POA: Diagnosis not present

## 2022-06-08 DIAGNOSIS — G40209 Localization-related (focal) (partial) symptomatic epilepsy and epileptic syndromes with complex partial seizures, not intractable, without status epilepticus: Secondary | ICD-10-CM | POA: Diagnosis not present

## 2022-06-08 DIAGNOSIS — I69951 Hemiplegia and hemiparesis following unspecified cerebrovascular disease affecting right dominant side: Secondary | ICD-10-CM | POA: Diagnosis not present

## 2022-06-08 DIAGNOSIS — M6281 Muscle weakness (generalized): Secondary | ICD-10-CM | POA: Diagnosis not present

## 2022-06-14 DIAGNOSIS — E119 Type 2 diabetes mellitus without complications: Secondary | ICD-10-CM | POA: Diagnosis not present

## 2022-06-14 DIAGNOSIS — H524 Presbyopia: Secondary | ICD-10-CM | POA: Diagnosis not present

## 2022-06-14 DIAGNOSIS — H25813 Combined forms of age-related cataract, bilateral: Secondary | ICD-10-CM | POA: Diagnosis not present

## 2022-06-22 DIAGNOSIS — G40201 Localization-related (focal) (partial) symptomatic epilepsy and epileptic syndromes with complex partial seizures, not intractable, with status epilepticus: Secondary | ICD-10-CM | POA: Diagnosis not present

## 2022-06-22 DIAGNOSIS — Z7185 Encounter for immunization safety counseling: Secondary | ICD-10-CM | POA: Diagnosis not present

## 2022-06-22 DIAGNOSIS — Z23 Encounter for immunization: Secondary | ICD-10-CM | POA: Diagnosis not present

## 2022-06-22 DIAGNOSIS — D513 Other dietary vitamin B12 deficiency anemia: Secondary | ICD-10-CM | POA: Diagnosis not present

## 2022-06-24 DIAGNOSIS — I5032 Chronic diastolic (congestive) heart failure: Secondary | ICD-10-CM | POA: Diagnosis not present

## 2022-06-24 DIAGNOSIS — I69951 Hemiplegia and hemiparesis following unspecified cerebrovascular disease affecting right dominant side: Secondary | ICD-10-CM | POA: Diagnosis not present

## 2022-06-24 DIAGNOSIS — E161 Other hypoglycemia: Secondary | ICD-10-CM | POA: Diagnosis not present

## 2022-06-24 DIAGNOSIS — G40209 Localization-related (focal) (partial) symptomatic epilepsy and epileptic syndromes with complex partial seizures, not intractable, without status epilepticus: Secondary | ICD-10-CM | POA: Diagnosis not present

## 2022-06-24 DIAGNOSIS — M6281 Muscle weakness (generalized): Secondary | ICD-10-CM | POA: Diagnosis not present

## 2022-06-24 DIAGNOSIS — E1149 Type 2 diabetes mellitus with other diabetic neurological complication: Secondary | ICD-10-CM | POA: Diagnosis not present

## 2022-06-24 DIAGNOSIS — I10 Essential (primary) hypertension: Secondary | ICD-10-CM | POA: Diagnosis not present

## 2022-06-24 DIAGNOSIS — G40201 Localization-related (focal) (partial) symptomatic epilepsy and epileptic syndromes with complex partial seizures, not intractable, with status epilepticus: Secondary | ICD-10-CM | POA: Diagnosis not present

## 2022-06-24 DIAGNOSIS — E441 Mild protein-calorie malnutrition: Secondary | ICD-10-CM | POA: Diagnosis not present

## 2022-07-01 DIAGNOSIS — I639 Cerebral infarction, unspecified: Secondary | ICD-10-CM | POA: Diagnosis not present

## 2022-07-01 DIAGNOSIS — M6281 Muscle weakness (generalized): Secondary | ICD-10-CM | POA: Diagnosis not present

## 2022-07-02 DIAGNOSIS — I639 Cerebral infarction, unspecified: Secondary | ICD-10-CM | POA: Diagnosis not present

## 2022-07-02 DIAGNOSIS — M6281 Muscle weakness (generalized): Secondary | ICD-10-CM | POA: Diagnosis not present

## 2022-07-03 DIAGNOSIS — M6281 Muscle weakness (generalized): Secondary | ICD-10-CM | POA: Diagnosis not present

## 2022-07-03 DIAGNOSIS — I639 Cerebral infarction, unspecified: Secondary | ICD-10-CM | POA: Diagnosis not present

## 2022-07-05 DIAGNOSIS — I639 Cerebral infarction, unspecified: Secondary | ICD-10-CM | POA: Diagnosis not present

## 2022-07-05 DIAGNOSIS — R059 Cough, unspecified: Secondary | ICD-10-CM | POA: Diagnosis not present

## 2022-07-05 DIAGNOSIS — M6281 Muscle weakness (generalized): Secondary | ICD-10-CM | POA: Diagnosis not present

## 2022-07-05 DIAGNOSIS — R278 Other lack of coordination: Secondary | ICD-10-CM | POA: Diagnosis not present

## 2022-07-06 DIAGNOSIS — L89892 Pressure ulcer of other site, stage 2: Secondary | ICD-10-CM | POA: Diagnosis not present

## 2022-07-06 DIAGNOSIS — I5032 Chronic diastolic (congestive) heart failure: Secondary | ICD-10-CM | POA: Diagnosis not present

## 2022-07-06 DIAGNOSIS — I69951 Hemiplegia and hemiparesis following unspecified cerebrovascular disease affecting right dominant side: Secondary | ICD-10-CM | POA: Diagnosis not present

## 2022-07-06 DIAGNOSIS — I10 Essential (primary) hypertension: Secondary | ICD-10-CM | POA: Diagnosis not present

## 2022-07-06 DIAGNOSIS — M6281 Muscle weakness (generalized): Secondary | ICD-10-CM | POA: Diagnosis not present

## 2022-07-06 DIAGNOSIS — I639 Cerebral infarction, unspecified: Secondary | ICD-10-CM | POA: Diagnosis not present

## 2022-07-06 DIAGNOSIS — R278 Other lack of coordination: Secondary | ICD-10-CM | POA: Diagnosis not present

## 2022-07-06 DIAGNOSIS — E1149 Type 2 diabetes mellitus with other diabetic neurological complication: Secondary | ICD-10-CM | POA: Diagnosis not present

## 2022-07-07 DIAGNOSIS — I639 Cerebral infarction, unspecified: Secondary | ICD-10-CM | POA: Diagnosis not present

## 2022-07-07 DIAGNOSIS — M6281 Muscle weakness (generalized): Secondary | ICD-10-CM | POA: Diagnosis not present

## 2022-07-07 DIAGNOSIS — R278 Other lack of coordination: Secondary | ICD-10-CM | POA: Diagnosis not present

## 2022-07-13 DIAGNOSIS — L89892 Pressure ulcer of other site, stage 2: Secondary | ICD-10-CM | POA: Diagnosis not present

## 2022-07-20 ENCOUNTER — Non-Acute Institutional Stay: Payer: Medicare PPO | Admitting: Hospice

## 2022-07-20 DIAGNOSIS — L89892 Pressure ulcer of other site, stage 2: Secondary | ICD-10-CM | POA: Diagnosis not present

## 2022-07-20 DIAGNOSIS — R630 Anorexia: Secondary | ICD-10-CM | POA: Diagnosis not present

## 2022-07-20 DIAGNOSIS — I6930 Unspecified sequelae of cerebral infarction: Secondary | ICD-10-CM

## 2022-07-20 DIAGNOSIS — Z515 Encounter for palliative care: Secondary | ICD-10-CM

## 2022-07-20 DIAGNOSIS — R1310 Dysphagia, unspecified: Secondary | ICD-10-CM

## 2022-07-20 NOTE — Progress Notes (Signed)
    [facility] Consult Note Telephone: [phone]  Fax: [phone]    Date of encounter: 07/20/22  PATIENT NAME: [name] [address] [place] [address]   [phone] (home)  DOB: 1946/01/01 MRN: [id number] PRIMARY CARE PROVIDER:    Biagio Borg, MD,  [place] [address] [phone]  REFERRING PROVIDER:   Biagio Borg, MD [address] [place],  [place] [address] [phone]  RESPONSIBLE PARTY:    Contact Information     Name Relation Home Work Mobile   [place] Son [place] Son   [phone]        I met face to face with patient in the facility. Palliative Care was asked to follow this patient by consultation request of Melissa Boehringer, NP  to address advance care planning and complex medical decision making. This is a follow up visit.  [name] is with patient during visit.                                   ASSESSMENT AND PLAN / RECOMMENDATIONS:   CODE STATUS: FULL CODE  Education provided on Palliative Medicine. Will continue to provide supportive care.   Symptom Management/Plan:  Late Effect CVA with right sided hemiplegia-Patient with spastic hemiplegia, generalized weakness. Patient dependent for adl's.  PT/OT for transfers, strengthening and mobility.  Fall precautions.    Dysphagia: Continue mechanical soft diet.  Downgrade to pured if needed.  Aspiration precautions.  ST consult as needed/plan.  Appetite-improving appetite. Continue boost glucose control supplement BID. Routine weights per facility.  Follow up Palliative Care Visit: Palliative care will continue to follow for complex medical decision making, advance care planning, and clarification of goals. Return in 6-8 weeks or prn.   HOSPICE ELIGIBILITY/DIAGNOSIS: TBD  Chief Complaint: Palliative Medicine follow up visit.   HISTORY OF PRESENT ILLNESS:  [name] is a 77  y.o. year old female  with multiple morbidities requiring close monitoring, with high risk of complications and mortality: spastic hemiplegia affecting right side, dysphagia secondary to CA. Hx of CHF, CAD, T2DM -A1c 7.11 April 2022, hypertension, dysphagia hyperlipidemia, macrocytic anemia.  Patient endorsed weakness, denies pain/discomfort.  Rest of 10 point ROS asked and negative History obtained from review of EMR, discussion with primary team, and interview with family, facility staff/caregiver and/or [name].  I reviewed available labs, medications, imaging, studies and related documents from the EMR.  Records reviewed and summarized above.    I spent 40 minutes providing this consultation; this includes time spent with patient/family, chart review and documentation. More than 50% of the time in this consultation was spent on counseling and coordinating communication. Thank you for the opportunity to participate in the care of [name].  The palliative care team will continue to follow. Please call our office at [phone] if we can be of additional assistance.   Melissa Spray, NP

## 2022-07-23 DIAGNOSIS — E119 Type 2 diabetes mellitus without complications: Secondary | ICD-10-CM | POA: Diagnosis not present

## 2022-07-23 DIAGNOSIS — F4320 Adjustment disorder, unspecified: Secondary | ICD-10-CM | POA: Diagnosis not present

## 2022-07-23 DIAGNOSIS — I509 Heart failure, unspecified: Secondary | ICD-10-CM | POA: Diagnosis not present

## 2022-07-23 DIAGNOSIS — E785 Hyperlipidemia, unspecified: Secondary | ICD-10-CM | POA: Diagnosis not present

## 2022-07-23 DIAGNOSIS — I10 Essential (primary) hypertension: Secondary | ICD-10-CM | POA: Diagnosis not present

## 2022-07-26 DIAGNOSIS — N390 Urinary tract infection, site not specified: Secondary | ICD-10-CM | POA: Diagnosis not present

## 2022-07-27 DIAGNOSIS — G40201 Localization-related (focal) (partial) symptomatic epilepsy and epileptic syndromes with complex partial seizures, not intractable, with status epilepticus: Secondary | ICD-10-CM | POA: Diagnosis not present

## 2022-07-27 DIAGNOSIS — D513 Other dietary vitamin B12 deficiency anemia: Secondary | ICD-10-CM | POA: Diagnosis not present

## 2022-07-28 DIAGNOSIS — I69951 Hemiplegia and hemiparesis following unspecified cerebrovascular disease affecting right dominant side: Secondary | ICD-10-CM | POA: Diagnosis not present

## 2022-07-28 DIAGNOSIS — M6281 Muscle weakness (generalized): Secondary | ICD-10-CM | POA: Diagnosis not present

## 2022-07-28 DIAGNOSIS — E1149 Type 2 diabetes mellitus with other diabetic neurological complication: Secondary | ICD-10-CM | POA: Diagnosis not present

## 2022-07-28 DIAGNOSIS — I5032 Chronic diastolic (congestive) heart failure: Secondary | ICD-10-CM | POA: Diagnosis not present

## 2022-07-28 DIAGNOSIS — E161 Other hypoglycemia: Secondary | ICD-10-CM | POA: Diagnosis not present

## 2022-07-28 DIAGNOSIS — I10 Essential (primary) hypertension: Secondary | ICD-10-CM | POA: Diagnosis not present

## 2022-07-28 DIAGNOSIS — E441 Mild protein-calorie malnutrition: Secondary | ICD-10-CM | POA: Diagnosis not present

## 2022-07-28 DIAGNOSIS — G40201 Localization-related (focal) (partial) symptomatic epilepsy and epileptic syndromes with complex partial seizures, not intractable, with status epilepticus: Secondary | ICD-10-CM | POA: Diagnosis not present

## 2022-07-28 DIAGNOSIS — G40209 Localization-related (focal) (partial) symptomatic epilepsy and epileptic syndromes with complex partial seizures, not intractable, without status epilepticus: Secondary | ICD-10-CM | POA: Diagnosis not present

## 2022-08-02 DIAGNOSIS — S72401D Unspecified fracture of lower end of right femur, subsequent encounter for closed fracture with routine healing: Secondary | ICD-10-CM | POA: Diagnosis not present

## 2022-08-02 DIAGNOSIS — M6281 Muscle weakness (generalized): Secondary | ICD-10-CM | POA: Diagnosis not present

## 2022-08-04 DIAGNOSIS — I639 Cerebral infarction, unspecified: Secondary | ICD-10-CM | POA: Diagnosis not present

## 2022-08-04 DIAGNOSIS — M6281 Muscle weakness (generalized): Secondary | ICD-10-CM | POA: Diagnosis not present

## 2022-08-04 DIAGNOSIS — R278 Other lack of coordination: Secondary | ICD-10-CM | POA: Diagnosis not present

## 2022-08-05 DIAGNOSIS — M6281 Muscle weakness (generalized): Secondary | ICD-10-CM | POA: Diagnosis not present

## 2022-08-05 DIAGNOSIS — R278 Other lack of coordination: Secondary | ICD-10-CM | POA: Diagnosis not present

## 2022-08-05 DIAGNOSIS — I639 Cerebral infarction, unspecified: Secondary | ICD-10-CM | POA: Diagnosis not present

## 2022-08-10 DIAGNOSIS — I639 Cerebral infarction, unspecified: Secondary | ICD-10-CM | POA: Diagnosis not present

## 2022-08-10 DIAGNOSIS — R278 Other lack of coordination: Secondary | ICD-10-CM | POA: Diagnosis not present

## 2022-08-10 DIAGNOSIS — M6281 Muscle weakness (generalized): Secondary | ICD-10-CM | POA: Diagnosis not present

## 2022-08-11 DIAGNOSIS — M6281 Muscle weakness (generalized): Secondary | ICD-10-CM | POA: Diagnosis not present

## 2022-08-11 DIAGNOSIS — I639 Cerebral infarction, unspecified: Secondary | ICD-10-CM | POA: Diagnosis not present

## 2022-08-11 DIAGNOSIS — R278 Other lack of coordination: Secondary | ICD-10-CM | POA: Diagnosis not present

## 2022-08-16 DIAGNOSIS — I639 Cerebral infarction, unspecified: Secondary | ICD-10-CM | POA: Diagnosis not present

## 2022-08-16 DIAGNOSIS — R278 Other lack of coordination: Secondary | ICD-10-CM | POA: Diagnosis not present

## 2022-08-16 DIAGNOSIS — M6281 Muscle weakness (generalized): Secondary | ICD-10-CM | POA: Diagnosis not present

## 2022-08-17 DIAGNOSIS — I639 Cerebral infarction, unspecified: Secondary | ICD-10-CM | POA: Diagnosis not present

## 2022-08-17 DIAGNOSIS — M6281 Muscle weakness (generalized): Secondary | ICD-10-CM | POA: Diagnosis not present

## 2022-08-17 DIAGNOSIS — R278 Other lack of coordination: Secondary | ICD-10-CM | POA: Diagnosis not present

## 2022-08-18 DIAGNOSIS — M6281 Muscle weakness (generalized): Secondary | ICD-10-CM | POA: Diagnosis not present

## 2022-08-18 DIAGNOSIS — R278 Other lack of coordination: Secondary | ICD-10-CM | POA: Diagnosis not present

## 2022-08-18 DIAGNOSIS — I639 Cerebral infarction, unspecified: Secondary | ICD-10-CM | POA: Diagnosis not present

## 2022-08-19 DIAGNOSIS — M6281 Muscle weakness (generalized): Secondary | ICD-10-CM | POA: Diagnosis not present

## 2022-08-19 DIAGNOSIS — R278 Other lack of coordination: Secondary | ICD-10-CM | POA: Diagnosis not present

## 2022-08-19 DIAGNOSIS — I639 Cerebral infarction, unspecified: Secondary | ICD-10-CM | POA: Diagnosis not present

## 2022-08-20 ENCOUNTER — Non-Acute Institutional Stay: Payer: Medicare PPO | Admitting: Hospice

## 2022-08-20 DIAGNOSIS — Z515 Encounter for palliative care: Secondary | ICD-10-CM | POA: Diagnosis not present

## 2022-08-20 DIAGNOSIS — M6281 Muscle weakness (generalized): Secondary | ICD-10-CM | POA: Diagnosis not present

## 2022-08-20 DIAGNOSIS — I69851 Hemiplegia and hemiparesis following other cerebrovascular disease affecting right dominant side: Secondary | ICD-10-CM | POA: Diagnosis not present

## 2022-08-20 DIAGNOSIS — R630 Anorexia: Secondary | ICD-10-CM

## 2022-08-20 DIAGNOSIS — R1310 Dysphagia, unspecified: Secondary | ICD-10-CM | POA: Diagnosis not present

## 2022-08-20 DIAGNOSIS — R278 Other lack of coordination: Secondary | ICD-10-CM | POA: Diagnosis not present

## 2022-08-20 DIAGNOSIS — I639 Cerebral infarction, unspecified: Secondary | ICD-10-CM | POA: Diagnosis not present

## 2022-08-20 NOTE — Progress Notes (Signed)
    [facility] Consult Note Telephone: [phone]  Fax: [phone]    Date of encounter: 08/20/22  PATIENT NAME: [name] [address] [place] [address]   [phone] (home)  DOB: 29-Dec-1945 MRN: [id number] PRIMARY CARE PROVIDER:    Biagio Borg, MD,  [facility] [place] [address] [phone]  REFERRING PROVIDER:   Biagio Borg, MD [address] [place],  [place] [address] [phone]  RESPONSIBLE PARTY:    Contact Information     Name Relation Home Work Mobile   [place] Son [place] Son   [phone]        I met face to face with patient in the facility. Palliative Care was asked to follow this patient by consultation request of Vilinda Boehringer, NP  to address advance care planning and complex medical decision making. This is a follow up visit.                                    ASSESSMENT AND PLAN / RECOMMENDATIONS:   CODE STATUS: FULL CODE  Education provided on Palliative Medicine. Will continue to provide supportive care.   Symptom Management/Plan: Right sided hemiplegia-secondary to CVA.  Patient with spastic hemiplegia, generalized weakness. Patient dependent for adl's.  PT/OT for transfers, strengthening and mobility.  Fall precautions.  Fall and safety precautions.  Dysphagia: No report of aspiration since last visit.  Continue mechanical soft diet. Aspiration precautions.  ST consult as needed/plan.  Appetite-improving appetite. Continue boost glucose control supplement BID. Routine weights per facility.  Follow up Palliative Care Visit: Palliative care will continue to follow for complex medical decision making, advance care planning, and clarification of goals. Return in 6-8 weeks or prn.   HOSPICE ELIGIBILITY/DIAGNOSIS: TBD  Chief Complaint: Palliative Medicine follow up visit.   HISTORY OF PRESENT ILLNESS:  WILLOH Cameron is a 77  y.o. year old female  with multiple morbidities requiring close monitoring, with high risk of complications and mortality: spastic hemiplegia affecting right side, dysphagia secondary to CA. Hx of CHF, CAD, T2DM -A1c 7.11 April 2022, hypertension, dysphagia hyperlipidemia, macrocytic anemia.  Patient endorsed weakness, denies pain/discomfort.  Rest of 10 point ROS asked and negative History obtained from review of EMR, discussion with primary team, and interview with family, facility staff/caregiver and/or [name].  I reviewed available labs, medications, imaging, studies and related documents from the EMR.  Records reviewed and summarized above.    I spent 35 minutes providing this consultation; this includes time spent with patient/family, chart review and documentation. More than 50% of the time in this consultation was spent on counseling and coordinating communication. Thank you for the opportunity to participate in the care of [name].  The palliative care team will continue to follow. Please call our office at [phone] if we can be of additional assistance.   Teodoro Spray, NP

## 2022-08-24 DIAGNOSIS — M6281 Muscle weakness (generalized): Secondary | ICD-10-CM | POA: Diagnosis not present

## 2022-08-24 DIAGNOSIS — I639 Cerebral infarction, unspecified: Secondary | ICD-10-CM | POA: Diagnosis not present

## 2022-08-24 DIAGNOSIS — R278 Other lack of coordination: Secondary | ICD-10-CM | POA: Diagnosis not present

## 2022-08-25 DIAGNOSIS — R278 Other lack of coordination: Secondary | ICD-10-CM | POA: Diagnosis not present

## 2022-08-25 DIAGNOSIS — Z20828 Contact with and (suspected) exposure to other viral communicable diseases: Secondary | ICD-10-CM | POA: Diagnosis not present

## 2022-08-25 DIAGNOSIS — I639 Cerebral infarction, unspecified: Secondary | ICD-10-CM | POA: Diagnosis not present

## 2022-08-25 DIAGNOSIS — M6281 Muscle weakness (generalized): Secondary | ICD-10-CM | POA: Diagnosis not present

## 2022-08-26 DIAGNOSIS — I639 Cerebral infarction, unspecified: Secondary | ICD-10-CM | POA: Diagnosis not present

## 2022-08-26 DIAGNOSIS — R278 Other lack of coordination: Secondary | ICD-10-CM | POA: Diagnosis not present

## 2022-08-26 DIAGNOSIS — M6281 Muscle weakness (generalized): Secondary | ICD-10-CM | POA: Diagnosis not present

## 2022-08-27 DIAGNOSIS — E161 Other hypoglycemia: Secondary | ICD-10-CM | POA: Diagnosis not present

## 2022-08-27 DIAGNOSIS — I10 Essential (primary) hypertension: Secondary | ICD-10-CM | POA: Diagnosis not present

## 2022-08-27 DIAGNOSIS — I69951 Hemiplegia and hemiparesis following unspecified cerebrovascular disease affecting right dominant side: Secondary | ICD-10-CM | POA: Diagnosis not present

## 2022-08-27 DIAGNOSIS — G40209 Localization-related (focal) (partial) symptomatic epilepsy and epileptic syndromes with complex partial seizures, not intractable, without status epilepticus: Secondary | ICD-10-CM | POA: Diagnosis not present

## 2022-08-27 DIAGNOSIS — I639 Cerebral infarction, unspecified: Secondary | ICD-10-CM | POA: Diagnosis not present

## 2022-08-27 DIAGNOSIS — I5032 Chronic diastolic (congestive) heart failure: Secondary | ICD-10-CM | POA: Diagnosis not present

## 2022-08-27 DIAGNOSIS — R278 Other lack of coordination: Secondary | ICD-10-CM | POA: Diagnosis not present

## 2022-08-27 DIAGNOSIS — D513 Other dietary vitamin B12 deficiency anemia: Secondary | ICD-10-CM | POA: Diagnosis not present

## 2022-08-27 DIAGNOSIS — M6281 Muscle weakness (generalized): Secondary | ICD-10-CM | POA: Diagnosis not present

## 2022-08-27 DIAGNOSIS — E441 Mild protein-calorie malnutrition: Secondary | ICD-10-CM | POA: Diagnosis not present

## 2022-08-27 DIAGNOSIS — E1149 Type 2 diabetes mellitus with other diabetic neurological complication: Secondary | ICD-10-CM | POA: Diagnosis not present

## 2022-08-30 DIAGNOSIS — M6281 Muscle weakness (generalized): Secondary | ICD-10-CM | POA: Diagnosis not present

## 2022-08-30 DIAGNOSIS — I639 Cerebral infarction, unspecified: Secondary | ICD-10-CM | POA: Diagnosis not present

## 2022-08-30 DIAGNOSIS — R278 Other lack of coordination: Secondary | ICD-10-CM | POA: Diagnosis not present

## 2022-08-31 DIAGNOSIS — I639 Cerebral infarction, unspecified: Secondary | ICD-10-CM | POA: Diagnosis not present

## 2022-08-31 DIAGNOSIS — R278 Other lack of coordination: Secondary | ICD-10-CM | POA: Diagnosis not present

## 2022-08-31 DIAGNOSIS — M6281 Muscle weakness (generalized): Secondary | ICD-10-CM | POA: Diagnosis not present

## 2022-09-01 DIAGNOSIS — I639 Cerebral infarction, unspecified: Secondary | ICD-10-CM | POA: Diagnosis not present

## 2022-09-01 DIAGNOSIS — R278 Other lack of coordination: Secondary | ICD-10-CM | POA: Diagnosis not present

## 2022-09-01 DIAGNOSIS — M6281 Muscle weakness (generalized): Secondary | ICD-10-CM | POA: Diagnosis not present

## 2022-09-02 DIAGNOSIS — M6281 Muscle weakness (generalized): Secondary | ICD-10-CM | POA: Diagnosis not present

## 2022-09-02 DIAGNOSIS — S72401D Unspecified fracture of lower end of right femur, subsequent encounter for closed fracture with routine healing: Secondary | ICD-10-CM | POA: Diagnosis not present

## 2022-09-03 DIAGNOSIS — M6281 Muscle weakness (generalized): Secondary | ICD-10-CM | POA: Diagnosis not present

## 2022-09-03 DIAGNOSIS — I639 Cerebral infarction, unspecified: Secondary | ICD-10-CM | POA: Diagnosis not present

## 2022-09-03 DIAGNOSIS — R278 Other lack of coordination: Secondary | ICD-10-CM | POA: Diagnosis not present

## 2022-09-10 DIAGNOSIS — I739 Peripheral vascular disease, unspecified: Secondary | ICD-10-CM | POA: Diagnosis not present

## 2022-09-10 DIAGNOSIS — L84 Corns and callosities: Secondary | ICD-10-CM | POA: Diagnosis not present

## 2022-09-10 DIAGNOSIS — Z794 Long term (current) use of insulin: Secondary | ICD-10-CM | POA: Diagnosis not present

## 2022-09-10 DIAGNOSIS — B351 Tinea unguium: Secondary | ICD-10-CM | POA: Diagnosis not present

## 2022-09-10 DIAGNOSIS — E1151 Type 2 diabetes mellitus with diabetic peripheral angiopathy without gangrene: Secondary | ICD-10-CM | POA: Diagnosis not present

## 2022-09-14 DIAGNOSIS — M6281 Muscle weakness (generalized): Secondary | ICD-10-CM | POA: Diagnosis not present

## 2022-09-14 DIAGNOSIS — I69951 Hemiplegia and hemiparesis following unspecified cerebrovascular disease affecting right dominant side: Secondary | ICD-10-CM | POA: Diagnosis not present

## 2022-09-14 DIAGNOSIS — I5032 Chronic diastolic (congestive) heart failure: Secondary | ICD-10-CM | POA: Diagnosis not present

## 2022-09-14 DIAGNOSIS — I10 Essential (primary) hypertension: Secondary | ICD-10-CM | POA: Diagnosis not present

## 2022-09-14 DIAGNOSIS — E782 Mixed hyperlipidemia: Secondary | ICD-10-CM | POA: Diagnosis not present

## 2022-09-14 DIAGNOSIS — E1149 Type 2 diabetes mellitus with other diabetic neurological complication: Secondary | ICD-10-CM | POA: Diagnosis not present

## 2022-09-16 ENCOUNTER — Non-Acute Institutional Stay: Payer: Medicare PPO | Admitting: Hospice

## 2022-09-16 DIAGNOSIS — R1310 Dysphagia, unspecified: Secondary | ICD-10-CM | POA: Diagnosis not present

## 2022-09-16 DIAGNOSIS — Z515 Encounter for palliative care: Secondary | ICD-10-CM | POA: Diagnosis not present

## 2022-09-16 DIAGNOSIS — R630 Anorexia: Secondary | ICD-10-CM | POA: Diagnosis not present

## 2022-09-16 DIAGNOSIS — I69851 Hemiplegia and hemiparesis following other cerebrovascular disease affecting right dominant side: Secondary | ICD-10-CM

## 2022-09-16 NOTE — Progress Notes (Signed)
    [facility] Consult Note Telephone: [phone]  Fax: [phone]    Date of encounter: 09/16/22  PATIENT NAME: [name] [address] [place] [address]   [phone] (home)  DOB: [birth year] MRN: [id number] PRIMARY CARE PROVIDER:    Biagio Borg, MD,  [place] [address] [phone]  REFERRING PROVIDER:   Biagio Borg, MD [address] [place],  [place] [address] [phone]  RESPONSIBLE PARTY:    Contact Information     Name Relation Home Work Mobile   [place] Son [place] Son   [phone]        I met face to face with patient in the facility. Palliative Care was asked to follow this patient by consultation request of Vilinda Boehringer, NP  to address advance care planning and complex medical decision making. This is a follow up visit.                                    ASSESSMENT AND PLAN / RECOMMENDATIONS:   CODE STATUS: FULL CODE  Education provided on Palliative Medicine. Will continue to provide supportive care.   Symptom Management/Plan: Right sided hemiplegia: spastic hemiplegia, generalized weakness.secondary to CVA. Provide assistance as needed for ADLs.  PT/OT for transfers, strengthening and mobility.  Fall precautions.    Dysphagia: Aspiration precaution; keep head of bed up during meals and at least one hour after meals.  ST consult as needed/plan.  Poor Appetite: improving appetite. Continue boost glucose control supplement BID. Offer assistance during meals as needed to ensure adequate oral intake. Routine weights per facility.  Follow up Palliative Care Visit: Palliative care will continue to follow for complex medical decision making, advance care planning, and clarification of goals. Return in 6-8 weeks or prn.   HOSPICE ELIGIBILITY/DIAGNOSIS: TBD  Chief Complaint: Palliative Medicine follow up visit.   HISTORY OF  PRESENT ILLNESS:  [name] is a 77 y.o. year old female  with multiple morbidities requiring close monitoring, with high risk of complications and mortality: spastic hemiplegia affecting right side, dysphagia secondary to CA. Hx of CHF, CAD, T2DM -A1c 7.11 April 2022, hypertension, dysphagia hyperlipidemia, macrocytic anemia.  Patient endorsed weakness, denies pain/discomfort.  Rest of 10 point ROS asked and negative History obtained from review of EMR, discussion with primary team, and interview with family, facility staff/caregiver and/or [name].  I reviewed available labs, medications, imaging, studies and related documents from the EMR.  Records reviewed and summarized above.    I spent 35 minutes providing this consultation; this includes time spent with patient/family, chart review and documentation. More than 50% of the time in this consultation was spent on counseling and coordinating communication. Thank you for the opportunity to participate in the care of [name].  The palliative care team will continue to follow. Please call our office at [phone] if we can be of additional assistance.   Teodoro Spray, NP

## 2022-09-22 DIAGNOSIS — D513 Other dietary vitamin B12 deficiency anemia: Secondary | ICD-10-CM | POA: Diagnosis not present

## 2022-09-22 DIAGNOSIS — E782 Mixed hyperlipidemia: Secondary | ICD-10-CM | POA: Diagnosis not present

## 2022-09-22 DIAGNOSIS — E119 Type 2 diabetes mellitus without complications: Secondary | ICD-10-CM | POA: Diagnosis not present

## 2022-09-22 DIAGNOSIS — H2513 Age-related nuclear cataract, bilateral: Secondary | ICD-10-CM | POA: Diagnosis not present

## 2022-09-23 DIAGNOSIS — I5032 Chronic diastolic (congestive) heart failure: Secondary | ICD-10-CM | POA: Diagnosis not present

## 2022-09-23 DIAGNOSIS — E1149 Type 2 diabetes mellitus with other diabetic neurological complication: Secondary | ICD-10-CM | POA: Diagnosis not present

## 2022-09-23 DIAGNOSIS — M6281 Muscle weakness (generalized): Secondary | ICD-10-CM | POA: Diagnosis not present

## 2022-09-23 DIAGNOSIS — I69951 Hemiplegia and hemiparesis following unspecified cerebrovascular disease affecting right dominant side: Secondary | ICD-10-CM | POA: Diagnosis not present

## 2022-09-23 DIAGNOSIS — G40209 Localization-related (focal) (partial) symptomatic epilepsy and epileptic syndromes with complex partial seizures, not intractable, without status epilepticus: Secondary | ICD-10-CM | POA: Diagnosis not present

## 2022-09-23 DIAGNOSIS — D513 Other dietary vitamin B12 deficiency anemia: Secondary | ICD-10-CM | POA: Diagnosis not present

## 2022-09-23 DIAGNOSIS — E782 Mixed hyperlipidemia: Secondary | ICD-10-CM | POA: Diagnosis not present

## 2022-09-23 DIAGNOSIS — E441 Mild protein-calorie malnutrition: Secondary | ICD-10-CM | POA: Diagnosis not present

## 2022-09-23 DIAGNOSIS — I10 Essential (primary) hypertension: Secondary | ICD-10-CM | POA: Diagnosis not present

## 2022-09-28 DIAGNOSIS — I639 Cerebral infarction, unspecified: Secondary | ICD-10-CM | POA: Diagnosis not present

## 2022-09-28 DIAGNOSIS — R278 Other lack of coordination: Secondary | ICD-10-CM | POA: Diagnosis not present

## 2022-09-28 DIAGNOSIS — M6281 Muscle weakness (generalized): Secondary | ICD-10-CM | POA: Diagnosis not present

## 2022-09-28 DIAGNOSIS — I83018 Varicose veins of right lower extremity with ulcer other part of lower leg: Secondary | ICD-10-CM | POA: Diagnosis not present

## 2022-09-29 DIAGNOSIS — R278 Other lack of coordination: Secondary | ICD-10-CM | POA: Diagnosis not present

## 2022-09-29 DIAGNOSIS — M6281 Muscle weakness (generalized): Secondary | ICD-10-CM | POA: Diagnosis not present

## 2022-09-29 DIAGNOSIS — I639 Cerebral infarction, unspecified: Secondary | ICD-10-CM | POA: Diagnosis not present

## 2022-09-30 DIAGNOSIS — M6281 Muscle weakness (generalized): Secondary | ICD-10-CM | POA: Diagnosis not present

## 2022-09-30 DIAGNOSIS — I639 Cerebral infarction, unspecified: Secondary | ICD-10-CM | POA: Diagnosis not present

## 2022-09-30 DIAGNOSIS — R278 Other lack of coordination: Secondary | ICD-10-CM | POA: Diagnosis not present

## 2022-10-01 DIAGNOSIS — M6281 Muscle weakness (generalized): Secondary | ICD-10-CM | POA: Diagnosis not present

## 2022-10-01 DIAGNOSIS — R278 Other lack of coordination: Secondary | ICD-10-CM | POA: Diagnosis not present

## 2022-10-01 DIAGNOSIS — I639 Cerebral infarction, unspecified: Secondary | ICD-10-CM | POA: Diagnosis not present

## 2022-10-04 DIAGNOSIS — I639 Cerebral infarction, unspecified: Secondary | ICD-10-CM | POA: Diagnosis not present

## 2022-10-04 DIAGNOSIS — R278 Other lack of coordination: Secondary | ICD-10-CM | POA: Diagnosis not present

## 2022-10-04 DIAGNOSIS — M6281 Muscle weakness (generalized): Secondary | ICD-10-CM | POA: Diagnosis not present

## 2022-10-05 DIAGNOSIS — I83018 Varicose veins of right lower extremity with ulcer other part of lower leg: Secondary | ICD-10-CM | POA: Diagnosis not present

## 2022-10-05 DIAGNOSIS — M6281 Muscle weakness (generalized): Secondary | ICD-10-CM | POA: Diagnosis not present

## 2022-10-05 DIAGNOSIS — R278 Other lack of coordination: Secondary | ICD-10-CM | POA: Diagnosis not present

## 2022-10-05 DIAGNOSIS — I639 Cerebral infarction, unspecified: Secondary | ICD-10-CM | POA: Diagnosis not present

## 2022-10-06 DIAGNOSIS — M6281 Muscle weakness (generalized): Secondary | ICD-10-CM | POA: Diagnosis not present

## 2022-10-06 DIAGNOSIS — R278 Other lack of coordination: Secondary | ICD-10-CM | POA: Diagnosis not present

## 2022-10-06 DIAGNOSIS — I639 Cerebral infarction, unspecified: Secondary | ICD-10-CM | POA: Diagnosis not present

## 2022-10-07 DIAGNOSIS — I639 Cerebral infarction, unspecified: Secondary | ICD-10-CM | POA: Diagnosis not present

## 2022-10-07 DIAGNOSIS — M6281 Muscle weakness (generalized): Secondary | ICD-10-CM | POA: Diagnosis not present

## 2022-10-07 DIAGNOSIS — R278 Other lack of coordination: Secondary | ICD-10-CM | POA: Diagnosis not present

## 2022-10-08 DIAGNOSIS — R278 Other lack of coordination: Secondary | ICD-10-CM | POA: Diagnosis not present

## 2022-10-08 DIAGNOSIS — M6281 Muscle weakness (generalized): Secondary | ICD-10-CM | POA: Diagnosis not present

## 2022-10-08 DIAGNOSIS — I639 Cerebral infarction, unspecified: Secondary | ICD-10-CM | POA: Diagnosis not present

## 2022-10-11 DIAGNOSIS — R278 Other lack of coordination: Secondary | ICD-10-CM | POA: Diagnosis not present

## 2022-10-11 DIAGNOSIS — M6281 Muscle weakness (generalized): Secondary | ICD-10-CM | POA: Diagnosis not present

## 2022-10-11 DIAGNOSIS — I639 Cerebral infarction, unspecified: Secondary | ICD-10-CM | POA: Diagnosis not present

## 2022-10-12 DIAGNOSIS — M6281 Muscle weakness (generalized): Secondary | ICD-10-CM | POA: Diagnosis not present

## 2022-10-12 DIAGNOSIS — R278 Other lack of coordination: Secondary | ICD-10-CM | POA: Diagnosis not present

## 2022-10-12 DIAGNOSIS — M79651 Pain in right thigh: Secondary | ICD-10-CM | POA: Diagnosis not present

## 2022-10-12 DIAGNOSIS — I83018 Varicose veins of right lower extremity with ulcer other part of lower leg: Secondary | ICD-10-CM | POA: Diagnosis not present

## 2022-10-12 DIAGNOSIS — M79604 Pain in right leg: Secondary | ICD-10-CM | POA: Diagnosis not present

## 2022-10-12 DIAGNOSIS — I639 Cerebral infarction, unspecified: Secondary | ICD-10-CM | POA: Diagnosis not present

## 2022-10-13 ENCOUNTER — Observation Stay (HOSPITAL_COMMUNITY): Payer: Medicare PPO

## 2022-10-13 ENCOUNTER — Emergency Department (HOSPITAL_COMMUNITY): Payer: Medicare PPO

## 2022-10-13 ENCOUNTER — Other Ambulatory Visit: Payer: Self-pay

## 2022-10-13 ENCOUNTER — Inpatient Hospital Stay (HOSPITAL_COMMUNITY)
Admission: EM | Admit: 2022-10-13 | Discharge: 2022-10-21 | DRG: 689 | Disposition: A | Discharge status: Skilled Nursing Facility | Payer: Medicare PPO | Attending: Internal Medicine | Admitting: Internal Medicine

## 2022-10-13 DIAGNOSIS — S72401G Unspecified fracture of lower end of right femur, subsequent encounter for closed fracture with delayed healing: Secondary | ICD-10-CM

## 2022-10-13 DIAGNOSIS — E876 Hypokalemia: Secondary | ICD-10-CM | POA: Diagnosis present

## 2022-10-13 DIAGNOSIS — E1159 Type 2 diabetes mellitus with other circulatory complications: Secondary | ICD-10-CM | POA: Diagnosis not present

## 2022-10-13 DIAGNOSIS — X58XXXA Exposure to other specified factors, initial encounter: Secondary | ICD-10-CM | POA: Diagnosis present

## 2022-10-13 DIAGNOSIS — G40209 Localization-related (focal) (partial) symptomatic epilepsy and epileptic syndromes with complex partial seizures, not intractable, without status epilepticus: Secondary | ICD-10-CM | POA: Diagnosis present

## 2022-10-13 DIAGNOSIS — E119 Type 2 diabetes mellitus without complications: Secondary | ICD-10-CM

## 2022-10-13 DIAGNOSIS — N390 Urinary tract infection, site not specified: Principal | ICD-10-CM | POA: Diagnosis present

## 2022-10-13 DIAGNOSIS — G8929 Other chronic pain: Secondary | ICD-10-CM | POA: Diagnosis present

## 2022-10-13 DIAGNOSIS — R531 Weakness: Secondary | ICD-10-CM

## 2022-10-13 DIAGNOSIS — S82151A Displaced fracture of right tibial tuberosity, initial encounter for closed fracture: Secondary | ICD-10-CM | POA: Diagnosis present

## 2022-10-13 DIAGNOSIS — Z7901 Long term (current) use of anticoagulants: Secondary | ICD-10-CM

## 2022-10-13 DIAGNOSIS — R5381 Other malaise: Secondary | ICD-10-CM | POA: Diagnosis present

## 2022-10-13 DIAGNOSIS — T84030A Mechanical loosening of internal right hip prosthetic joint, initial encounter: Secondary | ICD-10-CM | POA: Diagnosis present

## 2022-10-13 DIAGNOSIS — M6281 Muscle weakness (generalized): Secondary | ICD-10-CM | POA: Diagnosis not present

## 2022-10-13 DIAGNOSIS — Z8673 Personal history of transient ischemic attack (TIA), and cerebral infarction without residual deficits: Secondary | ICD-10-CM | POA: Diagnosis not present

## 2022-10-13 DIAGNOSIS — Z794 Long term (current) use of insulin: Secondary | ICD-10-CM | POA: Diagnosis not present

## 2022-10-13 DIAGNOSIS — Z1611 Resistance to penicillins: Secondary | ICD-10-CM | POA: Diagnosis present

## 2022-10-13 DIAGNOSIS — I639 Cerebral infarction, unspecified: Secondary | ICD-10-CM | POA: Diagnosis not present

## 2022-10-13 DIAGNOSIS — M79661 Pain in right lower leg: Secondary | ICD-10-CM | POA: Diagnosis not present

## 2022-10-13 DIAGNOSIS — M25561 Pain in right knee: Secondary | ICD-10-CM | POA: Diagnosis present

## 2022-10-13 DIAGNOSIS — M1711 Unilateral primary osteoarthritis, right knee: Secondary | ICD-10-CM | POA: Diagnosis present

## 2022-10-13 DIAGNOSIS — I5032 Chronic diastolic (congestive) heart failure: Secondary | ICD-10-CM | POA: Diagnosis present

## 2022-10-13 DIAGNOSIS — S82141A Displaced bicondylar fracture of right tibia, initial encounter for closed fracture: Secondary | ICD-10-CM | POA: Diagnosis present

## 2022-10-13 DIAGNOSIS — R627 Adult failure to thrive: Secondary | ICD-10-CM | POA: Diagnosis present

## 2022-10-13 DIAGNOSIS — Z87891 Personal history of nicotine dependence: Secondary | ICD-10-CM

## 2022-10-13 DIAGNOSIS — G8111 Spastic hemiplegia affecting right dominant side: Secondary | ICD-10-CM | POA: Diagnosis not present

## 2022-10-13 DIAGNOSIS — Q2112 Patent foramen ovale: Secondary | ICD-10-CM | POA: Diagnosis not present

## 2022-10-13 DIAGNOSIS — Z91014 Allergy to mammalian meats: Secondary | ICD-10-CM

## 2022-10-13 DIAGNOSIS — M79609 Pain in unspecified limb: Secondary | ICD-10-CM | POA: Diagnosis not present

## 2022-10-13 DIAGNOSIS — I10 Essential (primary) hypertension: Secondary | ICD-10-CM | POA: Diagnosis not present

## 2022-10-13 DIAGNOSIS — E785 Hyperlipidemia, unspecified: Secondary | ICD-10-CM | POA: Diagnosis present

## 2022-10-13 DIAGNOSIS — I2510 Atherosclerotic heart disease of native coronary artery without angina pectoris: Secondary | ICD-10-CM | POA: Diagnosis present

## 2022-10-13 DIAGNOSIS — S72401A Unspecified fracture of lower end of right femur, initial encounter for closed fracture: Secondary | ICD-10-CM | POA: Diagnosis present

## 2022-10-13 DIAGNOSIS — Z83438 Family history of other disorder of lipoprotein metabolism and other lipidemia: Secondary | ICD-10-CM

## 2022-10-13 DIAGNOSIS — I69351 Hemiplegia and hemiparesis following cerebral infarction affecting right dominant side: Secondary | ICD-10-CM

## 2022-10-13 DIAGNOSIS — F32A Depression, unspecified: Secondary | ICD-10-CM | POA: Diagnosis present

## 2022-10-13 DIAGNOSIS — Z7401 Bed confinement status: Secondary | ICD-10-CM | POA: Diagnosis not present

## 2022-10-13 DIAGNOSIS — B961 Klebsiella pneumoniae [K. pneumoniae] as the cause of diseases classified elsewhere: Secondary | ICD-10-CM | POA: Diagnosis present

## 2022-10-13 DIAGNOSIS — R278 Other lack of coordination: Secondary | ICD-10-CM | POA: Diagnosis not present

## 2022-10-13 DIAGNOSIS — I110 Hypertensive heart disease with heart failure: Secondary | ICD-10-CM | POA: Diagnosis present

## 2022-10-13 DIAGNOSIS — Z7984 Long term (current) use of oral hypoglycemic drugs: Secondary | ICD-10-CM

## 2022-10-13 DIAGNOSIS — Y929 Unspecified place or not applicable: Secondary | ICD-10-CM | POA: Diagnosis not present

## 2022-10-13 DIAGNOSIS — N3000 Acute cystitis without hematuria: Secondary | ICD-10-CM

## 2022-10-13 DIAGNOSIS — Y792 Prosthetic and other implants, materials and accessory orthopedic devices associated with adverse incidents: Secondary | ICD-10-CM | POA: Diagnosis present

## 2022-10-13 DIAGNOSIS — M79604 Pain in right leg: Secondary | ICD-10-CM | POA: Diagnosis not present

## 2022-10-13 DIAGNOSIS — Z79899 Other long term (current) drug therapy: Secondary | ICD-10-CM | POA: Diagnosis not present

## 2022-10-13 DIAGNOSIS — R609 Edema, unspecified: Secondary | ICD-10-CM | POA: Diagnosis not present

## 2022-10-13 DIAGNOSIS — M25571 Pain in right ankle and joints of right foot: Secondary | ICD-10-CM | POA: Diagnosis not present

## 2022-10-13 DIAGNOSIS — R509 Fever, unspecified: Secondary | ICD-10-CM | POA: Diagnosis not present

## 2022-10-13 DIAGNOSIS — Z8249 Family history of ischemic heart disease and other diseases of the circulatory system: Secondary | ICD-10-CM

## 2022-10-13 DIAGNOSIS — R600 Localized edema: Secondary | ICD-10-CM | POA: Diagnosis not present

## 2022-10-13 DIAGNOSIS — G9341 Metabolic encephalopathy: Secondary | ICD-10-CM | POA: Diagnosis present

## 2022-10-13 DIAGNOSIS — M5410 Radiculopathy, site unspecified: Secondary | ICD-10-CM | POA: Diagnosis present

## 2022-10-13 DIAGNOSIS — K449 Diaphragmatic hernia without obstruction or gangrene: Secondary | ICD-10-CM | POA: Diagnosis not present

## 2022-10-13 DIAGNOSIS — M79651 Pain in right thigh: Secondary | ICD-10-CM | POA: Diagnosis not present

## 2022-10-13 LAB — CBC
HCT: 42.8 % (ref 36.0–46.0)
Hemoglobin: 13.2 g/dL (ref 12.0–15.0)
MCH: 28.2 pg (ref 26.0–34.0)
MCHC: 30.8 g/dL (ref 30.0–36.0)
MCV: 91.5 fL (ref 80.0–100.0)
Platelets: 163 10*3/uL (ref 150–400)
RBC: 4.68 MIL/uL (ref 3.87–5.11)
RDW: 13.3 % (ref 11.5–15.5)
WBC: 10.9 10*3/uL — ABNORMAL HIGH (ref 4.0–10.5)
nRBC: 0 % (ref 0.0–0.2)

## 2022-10-13 LAB — BASIC METABOLIC PANEL
Anion gap: 13 (ref 5–15)
BUN: 9 mg/dL (ref 8–23)
CO2: 23 mmol/L (ref 22–32)
Calcium: 9 mg/dL (ref 8.9–10.3)
Chloride: 107 mmol/L (ref 98–111)
Creatinine, Ser: 0.94 mg/dL (ref 0.44–1.00)
GFR, Estimated: >60 mL/min (ref >60)
Glucose, Bld: 94 mg/dL (ref 70–99)
Potassium: 3.8 mmol/L (ref 3.5–5.1)
Sodium: 143 mmol/L (ref 135–145)

## 2022-10-13 LAB — URINALYSIS, ROUTINE W REFLEX MICROSCOPIC
Bilirubin Urine: NEGATIVE
Glucose, UA: NEGATIVE mg/dL
Ketones, ur: NEGATIVE mg/dL
Nitrite: POSITIVE — AB
Protein, ur: 30 mg/dL — AB
Specific Gravity, Urine: 1.011 (ref 1.005–1.030)
WBC, UA: >50 WBC/hpf (ref 0–5)
pH: 6 (ref 5.0–8.0)

## 2022-10-13 LAB — GLUCOSE, CAPILLARY: Glucose-Capillary: 107 mg/dL — ABNORMAL HIGH (ref 70–99)

## 2022-10-13 MED ORDER — ACETAMINOPHEN 650 MG RE SUPP: 650.0000 mg | Freq: Four times a day (QID) | RECTAL | Status: DC | PRN

## 2022-10-13 MED ORDER — ACETAMINOPHEN 500 MG PO TABS
1000.0000 mg | ORAL_TABLET | Freq: Once | ORAL | Status: AC
  Administered 2022-10-13: 1000 mg via ORAL
  Filled 2022-10-13: qty 2

## 2022-10-13 MED ORDER — SODIUM CHLORIDE 0.9 % IV SOLN
1.0000 g | Freq: Once | INTRAVENOUS | Status: AC
  Administered 2022-10-13: 1 g via INTRAVENOUS
  Filled 2022-10-13: qty 10

## 2022-10-13 MED ORDER — APIXABAN 5 MG PO TABS
5.0000 mg | ORAL_TABLET | Freq: Two times a day (BID) | ORAL | Status: DC
  Administered 2022-10-13 – 2022-10-21 (×16): 5 mg via ORAL
  Filled 2022-10-13 (×16): qty 1

## 2022-10-13 MED ORDER — ACETAMINOPHEN 325 MG PO TABS
650.0000 mg | ORAL_TABLET | Freq: Four times a day (QID) | ORAL | Status: DC | PRN
  Administered 2022-10-14 – 2022-10-15 (×4): 650 mg via ORAL
  Filled 2022-10-13 (×4): qty 2

## 2022-10-13 MED ORDER — LEVETIRACETAM 500 MG PO TABS
500.0000 mg | ORAL_TABLET | Freq: Two times a day (BID) | ORAL | Status: DC
  Administered 2022-10-13 – 2022-10-21 (×16): 500 mg via ORAL
  Filled 2022-10-13 (×16): qty 1

## 2022-10-13 MED ORDER — INSULIN ASPART 100 UNIT/ML IJ SOLN
0.0000 [IU] | INTRAMUSCULAR | Status: DC
  Administered 2022-10-14 (×2): 1 [IU] via SUBCUTANEOUS
  Administered 2022-10-15: 2 [IU] via SUBCUTANEOUS
  Administered 2022-10-15 (×2): 1 [IU] via SUBCUTANEOUS
  Administered 2022-10-15 – 2022-10-16 (×4): 2 [IU] via SUBCUTANEOUS
  Administered 2022-10-17: 1 [IU] via SUBCUTANEOUS
  Administered 2022-10-17: 7 [IU] via SUBCUTANEOUS
  Administered 2022-10-17 (×3): 1 [IU] via SUBCUTANEOUS
  Administered 2022-10-18: 2 [IU] via SUBCUTANEOUS
  Administered 2022-10-18: 3 [IU] via SUBCUTANEOUS
  Administered 2022-10-19 (×3): 1 [IU] via SUBCUTANEOUS
  Administered 2022-10-20: 2 [IU] via SUBCUTANEOUS
  Administered 2022-10-20: 3 [IU] via SUBCUTANEOUS

## 2022-10-13 NOTE — ED Provider Notes (Signed)
[place] EMERGENCY DEPARTMENT AT [facility] Provider Note   CSN: [id number] Arrival date & time: 10/13/22  1342     History  Chief Complaint  Patient presents with   Leg Pain    [name] is a 77 y.o. female.  Pt with c/o RLE/right knee pain, and family concern for possible uti.  Patient limited historian - level 5 caveat. Prior cva with right weakness. Bedbound at baseline.  Son wonders whether possibly in transferring patient whether right leg/knee may have been injured. No report of fall. Pt c/o right knee area pain, denies other c/o. No headache. No chest pain or sob. No abd pain or nv. No dysuria or gu c/o. No other extremity pain or injury. Is on eliquis.   The history is provided by the patient, a relative and the EMS personnel. The history is limited by the condition of the patient.  Leg Pain Associated symptoms: no back pain, no fever and no neck pain        Home Medications Prior to Admission medications   Medication Sig Start Date End Date Taking? Authorizing Provider  alum & mag hydroxide-simeth (MYLANTA MAXIMUM STRENGTH) 400-400-40 MG/5ML suspension Take 30 mLs by mouth 3 (three) times daily as needed for indigestion. (Not to exceed 3 doses daily). Inform MD if used more than 3 days. **Do not give at same time as antibiotics, Digoxin or Coumadin**    [provider]  atorvastatin (LIPITOR) 40 MG tablet Take 0.5 tablets (20 mg total) by mouth daily. 04/10/20   Glade Lloyd, MD  bisacodyl (DULCOLAX) 10 MG suppository Place 10 mg rectally as needed for moderate constipation. If no bowel movement within 72 hours or Milk of Magnesia, may give 1 suppository rectally (or 1 tablet) as needed for constipation and inform MD.    [provider]  bisacodyl (DULCOLAX) 5 MG EC tablet Take 5 mg by mouth as needed for moderate constipation. If no bowel movement within 72 hours or Milk of Magnesia, may give 1 tablet by mouth (or 1 suppository) as  needed for constipation and inform MD.    [provider]  ELIQUIS 5 MG TABS tablet Take 5 mg by mouth 2 (two) times daily.  12/30/19   [provider]  insulin detemir (LEVEMIR) 100 UNIT/ML injection INJECT 12 UNITS SUBCUTANEOUSLY twice daily 03/31/22   Rhetta Mura, MD  levETIRAcetam (KEPPRA) 500 MG tablet Take 1 tablet (500 mg total) by mouth 2 (two) times daily. 03/31/22   Rhetta Mura, MD  loperamide (IMODIUM A-D) 2 MG tablet Take 2-4 mg by mouth See admin instructions. Give 2 tablets (4 mg) by mouth at onset of diarrhea, then 1 tablet (2 mg) after each stool. (Not to exceed 16 mg total). If diarrhea persists for 36 hours notify MD.    [provider]  losartan (COZAAR) 25 MG tablet Take 12.5 mg by mouth daily.  12/30/19   [provider]  magnesium hydroxide (MILK OF MAGNESIA) 400 MG/5ML suspension Take 30 mLs by mouth 2 (two) times daily as needed for mild constipation. Use Miralax if Milk of Magnesia is contraindicated (example: Dialysis resident)    [provider]  metFORMIN (GLUCOPHAGE-XR) 750 MG 24 hr tablet Take 1 tablet (750 mg total) by mouth daily with breakfast. 02/15/22   Corwin Levins, MD  metoprolol succinate (TOPROL-XL) 25 MG 24 hr tablet Take 25 mg by mouth daily.  12/30/19   [provider]  nystatin cream (MYCOSTATIN) Apply 1  Application topically See admin instructions. Apply topically 2 times a day for cutaneous or mucocutaneous candida rash of abdomen folds, groin and under breasts for 21 days. D/C order if rash is healed after 21 days. **Update MD if rash not resolved at that time**    [provider]  Phenylephrine-DM-GG (ROBITUSSIN COUGH/COLD CF PO) Take 10 mLs by mouth every 4 (four) hours as needed (Cough). **Notify MD if cough persists, congestion x3 days or accompanied by fever**    [provider]  polyethylene glycol (MIRALAX / GLYCOLAX) 17 g packet Take 17 g by mouth daily as needed for  mild constipation or moderate constipation. Use Miralax if Milk of Magnesia is contraindicated (Example: Dialysis resident)    [provider]  promethazine (PHENERGAN) 25 MG tablet Take 25 mg by mouth every 4 (four) hours as needed for nausea or vomiting. May use suppository if unable to take by mouth. Notify MD if nausea persists after second dose.    [provider]      Allergies    Pork-derived products    Review of Systems   Review of Systems  Constitutional:  Negative for fever.  Respiratory:  Negative for cough and shortness of breath.   Cardiovascular:  Negative for chest pain.  Gastrointestinal:  Negative for abdominal pain, diarrhea and vomiting.  Genitourinary:  Negative for dysuria.  Musculoskeletal:  Negative for back pain and neck pain.  Skin:  Negative for wound.  Neurological:  Negative for headaches.    Physical Exam Updated Vital Signs BP 136/87 (BP Location: Right Arm)   Pulse 91   Temp 98.6 F (37 C) (Oral)   Resp 18   SpO2 92%  Physical Exam Vitals and nursing note reviewed.  Constitutional:      Appearance: Normal appearance. She is well-developed.  HENT:     Head: Atraumatic.     Nose: Nose normal.     Mouth/Throat:     Mouth: Mucous membranes are moist.  Eyes:     General: No scleral icterus.    Conjunctiva/sclera: Conjunctivae normal.     Pupils: Pupils are equal, round, and reactive to light.  Neck:     Vascular: No carotid bruit.     Trachea: No tracheal deviation.     Comments: No stiffness or rigidity.  Cardiovascular:     Rate and Rhythm: Normal rate and regular rhythm.     Pulses: Normal pulses.     Heart sounds: Normal heart sounds. No murmur heard.    No friction rub. No gallop.  Pulmonary:     Effort: Pulmonary effort is normal. No respiratory distress.     Breath sounds: Normal breath sounds.  Abdominal:     General: There is no distension.     Palpations: Abdomen is soft.     Tenderness: There is no  abdominal tenderness.  Genitourinary:    Comments: No cva tenderness.  Musculoskeletal:        General: No swelling.     Cervical back: Normal range of motion and neck supple. No rigidity. No muscular tenderness.     Comments: Pain/tenderness right knee. No pain/tenderness right hip or ankle. RLE of normal color and warmth. Distal pulses palp.   Skin:    General: Skin is warm and dry.     Findings: No rash.  Neurological:     Mental Status: She is alert.     Comments: Alert, speech normal. Neuro status/functional ability reported as c/w baseline.   Psychiatric:  Mood and Affect: Mood normal.     ED Results / Procedures / Treatments   Labs (all labs ordered are listed, but only abnormal results are displayed) Results for orders placed or performed during the hospital encounter of 03/28/22  C Difficile Quick Screen w PCR reflex   Specimen: STOOL  Result Value Ref Range   C Diff antigen NEGATIVE NEGATIVE   C Diff toxin NEGATIVE NEGATIVE   C Diff interpretation No C. difficile detected.   Blood Culture (routine x 2)   Specimen: BLOOD  Result Value Ref Range   Specimen Description BLOOD LEFT ARM    Special Requests      BOTTLES DRAWN AEROBIC AND ANAEROBIC Blood Culture results may not be optimal due to an excessive volume of blood received in culture bottles   Culture      NO GROWTH 5 DAYS Performed at [facility], [address] [place], [place] [address]    Report Status 04/02/2022 FINAL   Blood Culture (routine x 2)   Specimen: BLOOD  Result Value Ref Range   Specimen Description BLOOD LEFT WRIST    Special Requests      BOTTLES DRAWN AEROBIC AND ANAEROBIC Blood Culture results may not be optimal due to an excessive volume of blood received in culture bottles   Culture      NO GROWTH 5 DAYS Performed at [facility], [address] [place], [place] [address]    Report Status 04/02/2022 FINAL   Urine Culture   Specimen: In/Out Cath Urine  Result  Value Ref Range   Specimen Description IN/OUT CATH URINE    Special Requests NONE    Culture      NO GROWTH Performed at [facility], [address] [place], [place] [address]    Report Status 03/29/2022 FINAL   Comprehensive metabolic panel  Result Value Ref Range   Sodium 141 135 - 145 mmol/L   Potassium 3.5 3.5 - 5.1 mmol/L   Chloride 111 98 - 111 mmol/L   CO2 22 22 - 32 mmol/L   Glucose, Bld 241 (H) 70 - 99 mg/dL   BUN 10 8 - 23 mg/dL   Creatinine, Ser 5.40 0.44 - 1.00 mg/dL   Calcium 8.0 (L) 8.9 - 10.3 mg/dL   Total Protein 5.1 (L) 6.5 - 8.1 g/dL   Albumin 2.6 (L) 3.5 - 5.0 g/dL   AST 22 15 - 41 U/L   ALT 16 0 - 44 U/L   Alkaline Phosphatase 63 38 - 126 U/L   Total Bilirubin 0.2 (L) 0.3 - 1.2 mg/dL   GFR, Estimated >08 >67 mL/min   Anion gap 8 5 - 15  CBC with Differential  Result Value Ref Range   WBC 9.6 4.0 - 10.5 K/uL   RBC 4.35 3.87 - 5.11 MIL/uL   Hemoglobin 12.3 12.0 - 15.0 g/dL   HCT 61.9 50.9 - 32.6 %   MCV 92.4 80.0 - 100.0 fL   MCH 28.3 26.0 - 34.0 pg   MCHC 30.6 30.0 - 36.0 g/dL   RDW 71.2 45.8 - 09.9 %   Platelets 211 150 - 400 K/uL   nRBC 0.0 0.0 - 0.2 %   Neutrophils Relative % 73 %   Neutro Abs 7.0 1.7 - 7.7 K/uL   Lymphocytes Relative 20 %   Lymphs Abs 1.9 0.7 - 4.0 K/uL   Monocytes Relative 6 %   Monocytes Absolute 0.6 0.1 - 1.0 K/uL  Eosinophils Relative 1 %   Eosinophils Absolute 0.1 0.0 - 0.5 K/uL   Basophils Relative 0 %   Basophils Absolute 0.0 0.0 - 0.1 K/uL   Immature Granulocytes 0 %   Abs Immature Granulocytes 0.03 0.00 - 0.07 K/uL  Urinalysis, Routine w reflex microscopic STOOL  Result Value Ref Range   Color, Urine YELLOW YELLOW   APPearance HAZY (A) CLEAR   Specific Gravity, Urine 1.010 1.005 - 1.030   pH 6.0 5.0 - 8.0   Glucose, UA >=500 (A) NEGATIVE mg/dL   Hgb urine dipstick LARGE (A) NEGATIVE   Bilirubin Urine NEGATIVE NEGATIVE   Ketones, ur NEGATIVE NEGATIVE mg/dL   Protein, ur NEGATIVE NEGATIVE mg/dL    Nitrite NEGATIVE NEGATIVE   Leukocytes,Ua MODERATE (A) NEGATIVE  Lactic acid, plasma  Result Value Ref Range   Lactic Acid, Venous 3.4 (HH) 0.5 - 1.9 mmol/L  Lactic acid, plasma  Result Value Ref Range   Lactic Acid, Venous 2.3 (HH) 0.5 - 1.9 mmol/L  Protime-INR  Result Value Ref Range   Prothrombin Time 14.3 11.4 - 15.2 seconds   INR 1.1 0.8 - 1.2  APTT  Result Value Ref Range   aPTT 29 24 - 36 seconds  Urinalysis, Microscopic (reflex)  Result Value Ref Range   RBC / HPF 6-10 0 - 5 RBC/hpf   WBC, UA >50 0 - 5 WBC/hpf   Bacteria, UA FEW (A) NONE SEEN   Squamous Epithelial / HPF 0-5 0 - 5  CBC  Result Value Ref Range   WBC 6.1 4.0 - 10.5 K/uL   RBC 3.54 (L) 3.87 - 5.11 MIL/uL   Hemoglobin 10.2 (L) 12.0 - 15.0 g/dL   HCT 16.139.8 09.636.0 - 04.546.0 %   MCV 112.4 (H) 80.0 - 100.0 fL   MCH 28.8 26.0 - 34.0 pg   MCHC 25.6 (L) 30.0 - 36.0 g/dL   RDW 40.913.7 81.111.5 - 91.415.5 %   Platelets 150 150 - 400 K/uL   nRBC 0.0 0.0 - 0.2 %  Comprehensive metabolic panel  Result Value Ref Range   Sodium 143 135 - 145 mmol/L   Potassium 3.9 3.5 - 5.1 mmol/L   Chloride 108 98 - 111 mmol/L   CO2 24 22 - 32 mmol/L   Glucose, Bld 199 (H) 70 - 99 mg/dL   BUN 8 8 - 23 mg/dL   Creatinine, Ser 7.820.69 0.44 - 1.00 mg/dL   Calcium 9.0 8.9 - 95.610.3 mg/dL   Total Protein 6.2 (L) 6.5 - 8.1 g/dL   Albumin 3.2 (L) 3.5 - 5.0 g/dL   AST 29 15 - 41 U/L   ALT 24 0 - 44 U/L   Alkaline Phosphatase 71 38 - 126 U/L   Total Bilirubin 0.6 0.3 - 1.2 mg/dL   GFR, Estimated >21>60 >30>60 mL/min   Anion gap 11 5 - 15  Magnesium  Result Value Ref Range   Magnesium 1.5 (L) 1.7 - 2.4 mg/dL  Phosphorus  Result Value Ref Range   Phosphorus 3.2 2.5 - 4.6 mg/dL  Lactic acid, plasma  Result Value Ref Range   Lactic Acid, Venous 2.2 (HH) 0.5 - 1.9 mmol/L  Lactic acid, plasma  Result Value Ref Range   Lactic Acid, Venous 2.8 (HH) 0.5 - 1.9 mmol/L  Glucose, capillary  Result Value Ref Range   Glucose-Capillary 150 (H) 70 - 99 mg/dL   Glucose, capillary  Result Value Ref Range   Glucose-Capillary 113 (H) 70 - 99 mg/dL  Lactic acid,  plasma  Result Value Ref Range   Lactic Acid, Venous 2.2 (HH) 0.5 - 1.9 mmol/L  Lactic acid, plasma  Result Value Ref Range   Lactic Acid, Venous 1.4 0.5 - 1.9 mmol/L  CBC with Differential/Platelet  Result Value Ref Range   WBC 6.4 4.0 - 10.5 K/uL   RBC 4.44 3.87 - 5.11 MIL/uL   Hemoglobin 12.5 12.0 - 15.0 g/dL   HCT 16.1 09.6 - 04.5 %   MCV 89.2 80.0 - 100.0 fL   MCH 28.2 26.0 - 34.0 pg   MCHC 31.6 30.0 - 36.0 g/dL   RDW 40.9 81.1 - 91.4 %   Platelets 190 150 - 400 K/uL   nRBC 0.0 0.0 - 0.2 %   Neutrophils Relative % 52 %   Neutro Abs 3.4 1.7 - 7.7 K/uL   Lymphocytes Relative 36 %   Lymphs Abs 2.3 0.7 - 4.0 K/uL   Monocytes Relative 8 %   Monocytes Absolute 0.5 0.1 - 1.0 K/uL   Eosinophils Relative 4 %   Eosinophils Absolute 0.3 0.0 - 0.5 K/uL   Basophils Relative 0 %   Basophils Absolute 0.0 0.0 - 0.1 K/uL   Immature Granulocytes 0 %   Abs Immature Granulocytes 0.02 0.00 - 0.07 K/uL  Comprehensive metabolic panel  Result Value Ref Range   Sodium 143 135 - 145 mmol/L   Potassium 3.9 3.5 - 5.1 mmol/L   Chloride 111 98 - 111 mmol/L   CO2 26 22 - 32 mmol/L   Glucose, Bld 184 (H) 70 - 99 mg/dL   BUN 8 8 - 23 mg/dL   Creatinine, Ser 7.82 0.44 - 1.00 mg/dL   Calcium 8.6 (L) 8.9 - 10.3 mg/dL   Total Protein 5.5 (L) 6.5 - 8.1 g/dL   Albumin 2.8 (L) 3.5 - 5.0 g/dL   AST 27 15 - 41 U/L   ALT 25 0 - 44 U/L   Alkaline Phosphatase 74 38 - 126 U/L   Total Bilirubin 0.5 0.3 - 1.2 mg/dL   GFR, Estimated >95 >62 mL/min   Anion gap 6 5 - 15  Magnesium  Result Value Ref Range   Magnesium 2.2 1.7 - 2.4 mg/dL  Phosphorus  Result Value Ref Range   Phosphorus 3.0 2.5 - 4.6 mg/dL  Glucose, capillary  Result Value Ref Range   Glucose-Capillary 208 (H) 70 - 99 mg/dL  Glucose, capillary  Result Value Ref Range   Glucose-Capillary 126 (H) 70 - 99 mg/dL  Glucose, capillary  Result  Value Ref Range   Glucose-Capillary 186 (H) 70 - 99 mg/dL  Glucose, capillary  Result Value Ref Range   Glucose-Capillary 184 (H) 70 - 99 mg/dL  Comprehensive metabolic panel  Result Value Ref Range   Sodium 142 135 - 145 mmol/L   Potassium 4.0 3.5 - 5.1 mmol/L   Chloride 109 98 - 111 mmol/L   CO2 23 22 - 32 mmol/L   Glucose, Bld 165 (H) 70 - 99 mg/dL   BUN 9 8 - 23 mg/dL   Creatinine, Ser 1.30 0.44 - 1.00 mg/dL   Calcium 8.8 (L) 8.9 - 10.3 mg/dL   Total Protein 5.3 (L) 6.5 - 8.1 g/dL   Albumin 2.8 (L) 3.5 - 5.0 g/dL   AST 28 15 - 41 U/L   ALT 30 0 - 44 U/L   Alkaline Phosphatase 67 38 - 126 U/L   Total Bilirubin 0.5 0.3 - 1.2 mg/dL   GFR, Estimated >86 >57 mL/min  Anion gap 10 5 - 15  CBC with Differential/Platelet  Result Value Ref Range   WBC 6.8 4.0 - 10.5 K/uL   RBC 4.02 3.87 - 5.11 MIL/uL   Hemoglobin 11.5 (L) 12.0 - 15.0 g/dL   HCT 16.1 (L) 09.6 - 04.5 %   MCV 88.6 80.0 - 100.0 fL   MCH 28.6 26.0 - 34.0 pg   MCHC 32.3 30.0 - 36.0 g/dL   RDW 40.9 81.1 - 91.4 %   Platelets 194 150 - 400 K/uL   nRBC 0.0 0.0 - 0.2 %   Neutrophils Relative % 51 %   Neutro Abs 3.4 1.7 - 7.7 K/uL   Lymphocytes Relative 36 %   Lymphs Abs 2.5 0.7 - 4.0 K/uL   Monocytes Relative 8 %   Monocytes Absolute 0.6 0.1 - 1.0 K/uL   Eosinophils Relative 4 %   Eosinophils Absolute 0.3 0.0 - 0.5 K/uL   Basophils Relative 1 %   Basophils Absolute 0.0 0.0 - 0.1 K/uL   Immature Granulocytes 0 %   Abs Immature Granulocytes 0.02 0.00 - 0.07 K/uL  Magnesium  Result Value Ref Range   Magnesium 1.8 1.7 - 2.4 mg/dL  Phosphorus  Result Value Ref Range   Phosphorus 3.0 2.5 - 4.6 mg/dL  Glucose, capillary  Result Value Ref Range   Glucose-Capillary 234 (H) 70 - 99 mg/dL   Comment 1 Notify RN   Glucose, capillary  Result Value Ref Range   Glucose-Capillary 147 (H) 70 - 99 mg/dL  Glucose, capillary  Result Value Ref Range   Glucose-Capillary 166 (H) 70 - 99 mg/dL  Glucose, capillary  Result Value  Ref Range   Glucose-Capillary 155 (H) 70 - 99 mg/dL   Comment 1 Notify RN   CBG monitoring, ED  Result Value Ref Range   Glucose-Capillary 59 (L) 70 - 99 mg/dL  CBG monitoring, ED  Result Value Ref Range   Glucose-Capillary 254 (H) 70 - 99 mg/dL  CBG monitoring, ED  Result Value Ref Range   Glucose-Capillary 222 (H) 70 - 99 mg/dL  CBG monitoring, ED  Result Value Ref Range   Glucose-Capillary 39 (LL) 70 - 99 mg/dL   Comment 1 Notify RN   CBG monitoring, ED  Result Value Ref Range   Glucose-Capillary 58 (L) 70 - 99 mg/dL  CBG monitoring, ED  Result Value Ref Range   Glucose-Capillary 60 (L) 70 - 99 mg/dL  CBG monitoring, ED  Result Value Ref Range   Glucose-Capillary 111 (H) 70 - 99 mg/dL  CBG monitoring, ED  Result Value Ref Range   Glucose-Capillary 129 (H) 70 - 99 mg/dL  CBG monitoring, ED  Result Value Ref Range   Glucose-Capillary 132 (H) 70 - 99 mg/dL  CBG monitoring, ED  Result Value Ref Range   Glucose-Capillary 140 (H) 70 - 99 mg/dL  CBG monitoring, ED  Result Value Ref Range   Glucose-Capillary 174 (H) 70 - 99 mg/dL  CBG monitoring, ED  Result Value Ref Range   Glucose-Capillary 190 (H) 70 - 99 mg/dL  CBG monitoring, ED  Result Value Ref Range   Glucose-Capillary 241 (H) 70 - 99 mg/dL  CBG monitoring, ED  Result Value Ref Range   Glucose-Capillary 263 (H) 70 - 99 mg/dL    EKG None  Radiology DG FEMUR, MIN 2 VIEWS RIGHT  Result Date: 10/13/2022 CLINICAL DATA:  Patient is showing signs of pain with any movement of the right leg. Patient is confused unable to give  a good history. History of right femoral nail placement. EXAM: RIGHT KNEE - COMPLETE 4+ VIEW; RIGHT FEMUR 2 VIEWS COMPARISON:  Radiographs dated April 05, 2020 FINDINGS: Right femur: Right femoral intramedullary nail with diffuse osteopenia. Compared to prior examination there is increased perihardware lucency about the proximal aspect of the femoral component concerning for loosening. Interval  progression of the osseous remodeling about distal femoral supracondylar fracture. Diffuse osteopenia limits evaluation. Right knee: Osseous remodeling about the chronic supracondylar fracture of the right femur. Tricompartmental knee joint space narrowing and marginal osteophytes. No significant joint effusion. a Diffuse osteopenia. Vascular calcifications. IMPRESSION: Right femur: 1. Right femoral intramedullary nail with increased perihardware lucency about the proximal aspect of the femoral component concerning for loosening. Right knee: 1. Interval progression of the osseous remodeling about the chronic supracondylar fracture of the right femur. 2. Tricompartmental knee osteoarthritis. Electronically Signed   By: Larose Hires D.O.   On: 10/13/2022 15:57   DG Knee Complete 4 Views Right  Result Date: 10/13/2022 CLINICAL DATA:  Patient is showing signs of pain with any movement of the right leg. Patient is confused unable to give a good history. History of right femoral nail placement. EXAM: RIGHT KNEE - COMPLETE 4+ VIEW; RIGHT FEMUR 2 VIEWS COMPARISON:  Radiographs dated April 05, 2020 FINDINGS: Right femur: Right femoral intramedullary nail with diffuse osteopenia. Compared to prior examination there is increased perihardware lucency about the proximal aspect of the femoral component concerning for loosening. Interval progression of the osseous remodeling about distal femoral supracondylar fracture. Diffuse osteopenia limits evaluation. Right knee: Osseous remodeling about the chronic supracondylar fracture of the right femur. Tricompartmental knee joint space narrowing and marginal osteophytes. No significant joint effusion. a Diffuse osteopenia. Vascular calcifications. IMPRESSION: Right femur: 1. Right femoral intramedullary nail with increased perihardware lucency about the proximal aspect of the femoral component concerning for loosening. Right knee: 1. Interval progression of the osseous remodeling  about the chronic supracondylar fracture of the right femur. 2. Tricompartmental knee osteoarthritis. Electronically Signed   By: Larose Hires D.O.   On: 10/13/2022 15:57    Procedures Procedures    Medications Ordered in ED Medications - No data to display  ED Course/ Medical Decision Making/ A&P                             Medical Decision Making Amount and/or Complexity of Data Reviewed Labs: ordered. Radiology: ordered.   Iv ns. Continuous pulse ox and cardiac monitoring. Labs ordered/sent. Imaging ordered.   Differential diagnosis includes fracture, sprain/strain, dehydration, uti, etc. Dispo decision including potential need for admission considered - will get labs and imaging and reassess.   Reviewed nursing notes and prior charts for additional history. External reports reviewed. Additional history from: family, EMS.   Cardiac monitor: sinus rhythm, rate 90.  Labs reviewed/interpreted by me - pnd.   Xrays reviewed/interpreted by me - prior supracondylar fx.   1625, labs/ua pending - signed out to Dr Rubin Payor to check pending labs and UA and dispo appropriately.            Final Clinical Impression(s) / ED Diagnoses Final diagnoses:  None    Rx / DC Orders ED Discharge Orders     None         Cathren Laine, MD 10/13/22 1625

## 2022-10-13 NOTE — ED Notes (Signed)
Failed attempt to collect labs   

## 2022-10-13 NOTE — Assessment & Plan Note (Addendum)
Continue close blood pressure monitoring. PT and OT.  Chronic right sided hemiparesis with no ambulatory state.   PFO on apixaban.

## 2022-10-13 NOTE — ED Notes (Signed)
ED TO INPATIENT HANDOFF REPORT  ED Nurse Name and Phone #: Amil AmenJULIA [phone]  S Name/Age/Gender [name] 77 y.o. female Room/Bed: H020C/H020C  Code Status   Code Status: Prior  Home/SNF/Other Nursing Home Patient oriented to: self Is this baseline? Yes   Triage Complete: Triage complete  Chief Complaint Acute metabolic encephalopathy [G93.41]  Triage Note [facility] reports pt is coming from [facility]. Son visited today and stated his mothers right leg was hurting her.   Allergies Allergies  Allergen Reactions   Pork-Derived Products Other (See Comments)    To keep blood pressure down    Level of Care/Admitting Diagnosis ED Disposition     ED Disposition  Admit   Condition  --   Comment  [facility] [[id number]]  Level of Care: Med-Surg [16]  [facility] or [facility] if equivalent level of care is available:: No  Covid Evaluation: Asymptomatic - no recent exposure (last 10 days) testing not required  Diagnosis: Acute metabolic encephalopathy [3474259][[id number]]  Admitting Physician: Hillary BowGARDNER, JARED M [[id number]]  Attending Physician: Hillary BowGARDNER, JARED M [[id number]]          B Medical/Surgery History Past Medical History:  Diagnosis Date   Acute blood loss anemia    CHF (congestive heart failure) (HCC)    EF 50-55%, grade 1 diastolic dysfunction per echo 75/643310/2016   Coronary artery disease involving native artery of transplanted heart without angina pectoris    CVA (cerebral infarction) 04/2015   Started Plavix 04/2015   Depression    Diabetes (HCC) 2016   Type II. On insulin   Enchondroma of bone 2011   left femur.    Fracture, intertrochanteric, right femur (HCC)    History of lower GI bleeding    Hyperlipidemia    Hypertension    Patent foramen ovale 04/2015   Started on warfarin 04/2015   Protein calorie malnutrition (HCC)    Stercoral ulcer of rectum 05/16/2015   Past Surgical History:   Procedure Laterality Date   CARDIAC SURGERY     Cath without stent   COLONOSCOPY N/A 05/15/2015   Procedure: COLONOSCOPY;  Surgeon: Napoleon FormKavitha Nandigam V, MD;  Location: [facility];  Service: Endoscopy;  Laterality: N/A;   FLEXIBLE SIGMOIDOSCOPY N/A 05/15/2015   Procedure: FLEXIBLE SIGMOIDOSCOPY;  Surgeon: Napoleon FormKavitha Nandigam V, MD;  Location: [facility];  Service: Endoscopy;  Laterality: N/A;  at bedside   INTRAMEDULLARY (IM) NAIL INTERTROCHANTERIC Right 06/12/2015   Procedure: INTRAMEDULLARY (IM) NAIL RIGHT HIP;  Surgeon: Sheral Apleyimothy D Murphy, MD;  Location: [facility] OR;  Service: Orthopedics;  Laterality: Right;   TEE WITHOUT CARDIOVERSION N/A 05/05/2015   Procedure: TRANSESOPHAGEAL ECHOCARDIOGRAM (TEE);  Surgeon: Orpah CobbAjay Kadakia, MD;  [facility];  Service: Cardiovascular;  Laterality: N/A;     A IV Location/Drains/Wounds Patient Lines/Drains/Airways Status     Active Line/Drains/Airways     Name Placement date Placement time Site Days   Peripheral IV 10/13/22 20 G 1" Anterior;Left;Proximal Forearm 10/13/22  1730  Forearm  less than 1   Incision (Closed) 06/12/15 Leg Right 06/12/15  1213  -- 2680            Intake/Output Last 24 hours No intake or output data in the 24 hours ending 10/13/22 2048  Labs/Imaging Results for orders placed or performed during the hospital encounter of 10/13/22 (from the past 48 hour(s))  CBC     Status: Abnormal   Collection Time: 10/13/22  3:04 PM  Result Value Ref Range   WBC 10.9 (H) 4.0 - 10.5 K/uL   RBC 4.68 3.87 - 5.11 MIL/uL   Hemoglobin 13.2 12.0 - 15.0 g/dL   HCT 95.6 21.3 - 08.6 %   MCV 91.5 80.0 - 100.0 fL   MCH 28.2 26.0 - 34.0 pg   MCHC 30.8 30.0 - 36.0 g/dL   RDW 57.8 46.9 - 62.9 %   Platelets 163 150 - 400 K/uL   nRBC 0.0 0.0 - 0.2 %    Comment: Performed at [facility], [address], [place], [place] [address]  Basic metabolic panel     Status: None   Collection Time: 10/13/22  3:04 PM  Result Value Ref Range    Sodium 143 135 - 145 mmol/L   Potassium 3.8 3.5 - 5.1 mmol/L   Chloride 107 98 - 111 mmol/L   CO2 23 22 - 32 mmol/L   Glucose, Bld 94 70 - 99 mg/dL    Comment: Glucose reference range applies only to samples taken after fasting for at least 8 hours.   BUN 9 8 - 23 mg/dL   Creatinine, Ser 3.24 0.44 - 1.00 mg/dL   Calcium 9.0 8.9 - 40.1 mg/dL   GFR, Estimated >02 >72 mL/min    Comment: (NOTE) Calculated using the CKD-EPI Creatinine Equation (2021)    Anion gap 13 5 - 15    Comment: Performed at [facility], [address], [place], [place] [address]  Urinalysis, Routine w reflex microscopic -Urine, Catheterized     Status: Abnormal   Collection Time: 10/13/22  4:45 PM  Result Value Ref Range   Color, Urine YELLOW YELLOW   APPearance CLOUDY (A) CLEAR   Specific Gravity, Urine 1.011 1.005 - 1.030   pH 6.0 5.0 - 8.0   Glucose, UA NEGATIVE NEGATIVE mg/dL   Hgb urine dipstick MODERATE (A) NEGATIVE   Bilirubin Urine NEGATIVE NEGATIVE   Ketones, ur NEGATIVE NEGATIVE mg/dL   Protein, ur 30 (A) NEGATIVE mg/dL   Nitrite POSITIVE (A) NEGATIVE   Leukocytes,Ua LARGE (A) NEGATIVE   RBC / HPF 6-10 0 - 5 RBC/hpf   WBC, UA >50 0 - 5 WBC/hpf   Bacteria, UA RARE (A) NONE SEEN   Squamous Epithelial / HPF 0-5 0 - 5 /HPF    Comment: Performed at [facility], [address], [place], [place] [address]   DG FEMUR, MIN 2 VIEWS RIGHT  Result Date: 10/13/2022 CLINICAL DATA:  Patient is showing signs of pain with any movement of the right leg. Patient is confused unable to give a good history. History of right femoral nail placement. EXAM: RIGHT KNEE - COMPLETE 4+ VIEW; RIGHT FEMUR 2 VIEWS COMPARISON:  Radiographs dated April 05, 2020 FINDINGS: Right femur: Right femoral intramedullary nail with diffuse osteopenia. Compared to prior examination there is increased perihardware lucency about the proximal aspect of the femoral component concerning for loosening. Interval progression of the osseous  remodeling about distal femoral supracondylar fracture. Diffuse osteopenia limits evaluation. Right knee: Osseous remodeling about the chronic supracondylar fracture of the right femur. Tricompartmental knee joint space narrowing and marginal osteophytes. No significant joint effusion. a Diffuse osteopenia. Vascular calcifications. IMPRESSION: Right femur: 1. Right femoral intramedullary nail with increased perihardware lucency about the proximal aspect of the femoral component concerning for loosening. Right knee: 1. Interval progression of the osseous remodeling about the chronic supracondylar fracture of the right femur. 2. Tricompartmental knee osteoarthritis. Electronically Signed  By: Larose Hires D.O.   On: 10/13/2022 15:57   DG Knee Complete 4 Views Right  Result Date: 10/13/2022 CLINICAL DATA:  Patient is showing signs of pain with any movement of the right leg. Patient is confused unable to give a good history. History of right femoral nail placement. EXAM: RIGHT KNEE - COMPLETE 4+ VIEW; RIGHT FEMUR 2 VIEWS COMPARISON:  Radiographs dated April 05, 2020 FINDINGS: Right femur: Right femoral intramedullary nail with diffuse osteopenia. Compared to prior examination there is increased perihardware lucency about the proximal aspect of the femoral component concerning for loosening. Interval progression of the osseous remodeling about distal femoral supracondylar fracture. Diffuse osteopenia limits evaluation. Right knee: Osseous remodeling about the chronic supracondylar fracture of the right femur. Tricompartmental knee joint space narrowing and marginal osteophytes. No significant joint effusion. a Diffuse osteopenia. Vascular calcifications. IMPRESSION: Right femur: 1. Right femoral intramedullary nail with increased perihardware lucency about the proximal aspect of the femoral component concerning for loosening. Right knee: 1. Interval progression of the osseous remodeling about the chronic  supracondylar fracture of the right femur. 2. Tricompartmental knee osteoarthritis. Electronically Signed   By: Larose Hires D.O.   On: 10/13/2022 15:57    Pending Labs Unresulted Labs (From admission, onward)     Start     Ordered   10/13/22 2007  Urine Culture  Once,   URGENT       Question:  Indication  Answer:  Altered mental status (if no other cause identified)   10/13/22 2006            Vitals/Pain Today's Vitals   10/13/22 1928 10/13/22 1945 10/13/22 2000 10/13/22 2015  BP: 120/71 110/77 111/64 111/64  Pulse: 84 83 81 81  Resp: 14  16   Temp: 97.9 F (36.6 C)     TempSrc: Oral     SpO2: 99% 100% 97% 97%  PainSc: 0-No pain       Isolation Precautions No active isolations  Medications Medications  cefTRIAXone (ROCEPHIN) 1 g in sodium chloride 0.9 % 100 mL IVPB (1 g Intravenous New Bag/Given 10/13/22 2033)  levETIRAcetam (KEPPRA) tablet 500 mg (has no administration in time range)  acetaminophen (TYLENOL) tablet 1,000 mg (1,000 mg Oral Given 10/13/22 1628)    Mobility walks with person assist     Focused Assessments Neuro Assessment Handoff:  Swallow screen pass?  N/a         Neuro Assessment:   Neuro Checks:      Has TPA been given? No If patient is a Neuro Trauma and patient is going to OR before floor call report to 4N Charge nurse: [phone] or [phone]   R Recommendations: See Admitting Provider Note  Report given to:   Additional Notes: pt has UTI, given rocephin x 1. Son, terry,  very involved

## 2022-10-13 NOTE — ED Notes (Signed)
Attempted to call Son, Aurther Loft, x2, call goes straight to voicemail.

## 2022-10-13 NOTE — Assessment & Plan Note (Addendum)
Fasting glucose is 98,  Continue insulin sliding scale for glucose cover and monitoring.

## 2022-10-13 NOTE — ED Notes (Signed)
Son at bedside and has concerns about pt going back to the same facility.

## 2022-10-13 NOTE — Assessment & Plan Note (Addendum)
Persistent right knee pain.  Right posterior calf with full thickness wound, present on admission.   Right femur and knee radiograph Femoral intramedullary nail with increased peri-hardware lucency about the proximal aspect of the femoral component concerning for loosening.  Interval progression of the osseous remodeling about the chronic supracondylar fracture of the right femur. Tricompartmental knee ostearthritis.   Patient continue to have significant pain right knee.  Plan to add long acting oxycodone 10 mg bid, continue with scheduled ibuprofen and acetaminophen. Topical diclofenac.  Continue with as needed short acting oxycodone.   [place] with no evidence of deep vein thrombosis on left or right lower extremities.   Late entry: patient has not received short acting oxycodone, will hold on further long acting oxycodone, try short acting first

## 2022-10-13 NOTE — Assessment & Plan Note (Addendum)
Holding blood pressure medications to prevent hypotension.  Heart failure with diastolic dysfunction, with no exacerbation.  Coronary artery disease.

## 2022-10-13 NOTE — Assessment & Plan Note (Deleted)
With ? Loosening of hardware.

## 2022-10-13 NOTE — Assessment & Plan Note (Deleted)
From prior stroke.

## 2022-10-13 NOTE — ED Triage Notes (Signed)
[facility] reports pt is coming from [facility]. Son visited today and stated his mothers right leg was hurting her.

## 2022-10-13 NOTE — ED Notes (Signed)
Call son, Aurther Loft, with patient status, please. [phone]

## 2022-10-13 NOTE — H&P (Signed)
History and Physical    Patient: [name] IFO:[id number] DOB: 1946/02/18 DOA: 10/13/2022 DOS: the patient was seen and examined on 10/13/2022 PCP: Corwin Levins, MD  Patient coming from: Home  Chief Complaint:  Chief Complaint  Patient presents with   Leg Pain   HPI: [name] is a 77 y.o. female with medical history significant of DM2, HTN, HLD, stroke with chronic R sided hemiparesis.  Pt with h/o R hip fx s/p repair, back in 2016.  Distal R femur fx managed non-operatively in 2021 (pt non ambulatory due to stroke).  Pt with increased R knee pain and confusion since a bed transfer a couple of days ago per son.  Decreased PO intake.  No report of fall. Pt c/o right knee area pain, denies other c/o. No headache. No chest pain or sob. No abd pain or nv. No dysuria or gu c/o. No other extremity pain or injury.   Review of Systems: unable to review all systems due to the inability of the patient to answer questions. Past Medical History:  Diagnosis Date   Acute blood loss anemia    CHF (congestive heart failure) (HCC)    EF 50-55%, grade 1 diastolic dysfunction per echo 67/6720   Coronary artery disease involving native artery of transplanted heart without angina pectoris    CVA (cerebral infarction) 04/2015   Started Plavix 04/2015   Depression    Diabetes (HCC) 2016   Type II. On insulin   Enchondroma of bone 2011   left femur.    Fracture, intertrochanteric, right femur (HCC)    History of lower GI bleeding    Hyperlipidemia    Hypertension    Patent foramen ovale 04/2015   Started on warfarin 04/2015   Protein calorie malnutrition (HCC)    Stercoral ulcer of rectum 05/16/2015   Past Surgical History:  Procedure Laterality Date   CARDIAC SURGERY     Cath without stent   COLONOSCOPY N/A 05/15/2015   Procedure: COLONOSCOPY;  Surgeon: Napoleon Form, MD;  Location: [facility];  Service: Endoscopy;  Laterality: N/A;   FLEXIBLE SIGMOIDOSCOPY N/A 05/15/2015    Procedure: FLEXIBLE SIGMOIDOSCOPY;  Surgeon: Napoleon Form, MD;  Location: [facility];  Service: Endoscopy;  Laterality: N/A;  at bedside   INTRAMEDULLARY (IM) NAIL INTERTROCHANTERIC Right 06/12/2015   Procedure: INTRAMEDULLARY (IM) NAIL RIGHT HIP;  Surgeon: Sheral Apley, MD;  Location: [facility] OR;  Service: Orthopedics;  Laterality: Right;   TEE WITHOUT CARDIOVERSION N/A 05/05/2015   Procedure: TRANSESOPHAGEAL ECHOCARDIOGRAM (TEE);  Surgeon: Orpah Cobb, MD;  Location: [facility] ENDOSCOPY;  Service: Cardiovascular;  Laterality: N/A;   Social History:  reports that she quit smoking about 7 years ago. Her smoking use included cigarettes. She has never used smokeless tobacco. She reports that she does not drink alcohol and does not use drugs.  Allergies  Allergen Reactions   Pork-Derived Products Other (See Comments)    To keep blood pressure down    Family History  Problem Relation Age of Onset   Hypertension Mother    Heart failure Mother    Hyperlipidemia Mother    Heart attack Father     Prior to Admission medications   Medication Sig Start Date End Date Taking? Authorizing Provider  alum & mag hydroxide-simeth (MYLANTA MAXIMUM STRENGTH) 400-400-40 MG/5ML suspension Take 30 mLs by mouth 3 (three) times daily as needed for indigestion. (Not to exceed 3 doses daily). Inform MD if used more than 3 days. **Do not  give at same time as antibiotics, Digoxin or Coumadin**    [provider]  atorvastatin (LIPITOR) 40 MG tablet Take 0.5 tablets (20 mg total) by mouth daily. 04/10/20   Glade Lloyd, MD  bisacodyl (DULCOLAX) 10 MG suppository Place 10 mg rectally as needed for moderate constipation. If no bowel movement within 72 hours or Milk of Magnesia, may give 1 suppository rectally (or 1 tablet) as needed for constipation and inform MD.    [provider]  bisacodyl (DULCOLAX) 5 MG EC tablet Take 5 mg by mouth as needed for moderate constipation. If no bowel movement  within 72 hours or Milk of Magnesia, may give 1 tablet by mouth (or 1 suppository) as needed for constipation and inform MD.    [provider]  ELIQUIS 5 MG TABS tablet Take 5 mg by mouth 2 (two) times daily.  12/30/19   [provider]  insulin detemir (LEVEMIR) 100 UNIT/ML injection INJECT 12 UNITS SUBCUTANEOUSLY twice daily 03/31/22   Rhetta Mura, MD  levETIRAcetam (KEPPRA) 500 MG tablet Take 1 tablet (500 mg total) by mouth 2 (two) times daily. 03/31/22   Rhetta Mura, MD  loperamide (IMODIUM A-D) 2 MG tablet Take 2-4 mg by mouth See admin instructions. Give 2 tablets (4 mg) by mouth at onset of diarrhea, then 1 tablet (2 mg) after each stool. (Not to exceed 16 mg total). If diarrhea persists for 36 hours notify MD.    [provider]  losartan (COZAAR) 25 MG tablet Take 12.5 mg by mouth daily.  12/30/19   [provider]  magnesium hydroxide (MILK OF MAGNESIA) 400 MG/5ML suspension Take 30 mLs by mouth 2 (two) times daily as needed for mild constipation. Use Miralax if Milk of Magnesia is contraindicated (example: Dialysis resident)    [provider]  metFORMIN (GLUCOPHAGE-XR) 750 MG 24 hr tablet Take 1 tablet (750 mg total) by mouth daily with breakfast. 02/15/22   Corwin Levins, MD  metoprolol succinate (TOPROL-XL) 25 MG 24 hr tablet Take 25 mg by mouth daily.  12/30/19   [provider]  nystatin cream (MYCOSTATIN) Apply 1 Application topically See admin instructions. Apply topically 2 times a day for cutaneous or mucocutaneous candida rash of abdomen folds, groin and under breasts for 21 days. D/C order if rash is healed after 21 days. **Update MD if rash not resolved at that time**    [provider]  Phenylephrine-DM-GG (ROBITUSSIN COUGH/COLD CF PO) Take 10 mLs by mouth every 4 (four) hours as needed (Cough). **Notify MD if cough persists, congestion x3 days or accompanied by fever**    [provider]   polyethylene glycol (MIRALAX / GLYCOLAX) 17 g packet Take 17 g by mouth daily as needed for mild constipation or moderate constipation. Use Miralax if Milk of Magnesia is contraindicated (Example: Dialysis resident)    [provider]  promethazine (PHENERGAN) 25 MG tablet Take 25 mg by mouth every 4 (four) hours as needed for nausea or vomiting. May use suppository if unable to take by mouth. Notify MD if nausea persists after second dose.    [provider]    Physical Exam: Vitals:   10/13/22 1945 10/13/22 2000 10/13/22 2015 10/13/22 2133  BP: 110/77 111/64 111/64 120/68  Pulse: 83 81 81 80  Resp:  16  16  Temp:    98 F (36.7 C)  TempSrc:    Oral  SpO2: 100% 97% 97% 97%   Constitutional: NAD, calm, comfortable Respiratory:  clear to auscultation bilaterally, no wheezing, no crackles. Normal respiratory effort. No accessory muscle use.  Cardiovascular: Regular rate and rhythm, no murmurs / rubs / gallops. No extremity edema. 2+ pedal pulses. No carotid bruits.  Abdomen: no tenderness, no masses palpated. No hepatosplenomegaly. Bowel sounds positive.  Musculoskeletal: TTP R knee, not R hip nor ankle though. Neurologic: R sided spastic hemiparesis Psychiatric: Normal judgment and insight. Alert and oriented x 3. Normal mood.   Data Reviewed:    Urinalysis    Component Value Date/Time   COLORURINE YELLOW 10/13/2022 1645   APPEARANCEUR CLOUDY (A) 10/13/2022 1645   LABSPEC 1.011 10/13/2022 1645   PHURINE 6.0 10/13/2022 1645   GLUCOSEU NEGATIVE 10/13/2022 1645   GLUCOSEU >1000 mg/dL (A) 16/10/960409/17/2021 [id number]   HGBUR MODERATE (A) 10/13/2022 1645   BILIRUBINUR NEGATIVE 10/13/2022 1645   KETONESUR NEGATIVE 10/13/2022 1645   PROTEINUR 30 (A) 10/13/2022 1645   UROBILINOGEN 0.2 03/21/2020 1013   NITRITE POSITIVE (A) 10/13/2022 1645   LEUKOCYTESUR LARGE (A) 10/13/2022 1645    X ray R femur and knee: IMPRESSION: Right femur:   1. Right femoral intramedullary nail  with increased perihardware lucency about the proximal aspect of the femoral component concerning for loosening.   Right knee:   1. Interval progression of the osseous remodeling about the chronic supracondylar fracture of the right femur. 2. Tricompartmental knee osteoarthritis.  Assessment and Plan: * Acute metabolic encephalopathy Likely hypoactive delirium + recrudescence of stroke symptoms in setting of possible UTI + possible loosening of hardware in R hip. Empirically treat UTI and see if this improves. Consult ortho in AM regarding x ray findings of R hip  Right knee pain Pt with worse R knee pain. Possibly referred pain due to loosening of hardware demonstrated on X ray of R hip? Check X ray R ankle Call ortho in AM for consult Though EDP felt this probably would be managed non-surgically given that she is non-ambulatory at baseline.  UTI (urinary tract infection) UTI vs chronic bacteruria / pyuria. Given AMS: will treat with empiric rocephin Culture pending  Closed fracture of right distal femur With ? Loosening of hardware.  Partial symptomatic epilepsy with complex partial seizures, not intractable, without status epilepticus Cont keppra  Right spastic hemiparesis From prior stroke.  History of stroke Cont statin  Hypertension Cont home BP meds once med rec is completed.  Diabetes DM2 on insulin. SSI sensitive Q4H for the moment due to decreased PO intake. Hold home long acting + metformin.      Advance Care Planning:   Code Status: Full Code MOST form in chart  Consults: None, day team to call ortho surgery in AM as discussed above  Family Communication: Spoke with Janae SauceSon Terry on phone  Severity of Illness: The appropriate patient status for this patient is OBSERVATION. Observation status is judged to be reasonable and necessary in order to provide the required intensity of service to ensure the patient's safety. The patient's presenting symptoms,  physical exam findings, and initial radiographic and laboratory data in the context of their medical condition is felt to place them at decreased risk for further clinical deterioration. Furthermore, it is anticipated that the patient will be medically stable for discharge from the hospital within 2 midnights of admission.   Author: Hillary BowGARDNER, Kiptyn Rafuse M., DO 10/13/2022 9:36 PM  For on call review [email or web address].

## 2022-10-13 NOTE — Assessment & Plan Note (Addendum)
Likely multifactorial including urinary tract infection and uncontrolled right knee pain.  Clinically resolving.

## 2022-10-13 NOTE — Assessment & Plan Note (Signed)
Cont keppra

## 2022-10-13 NOTE — ED Notes (Signed)
Message left for son, terry, to call back

## 2022-10-13 NOTE — Assessment & Plan Note (Addendum)
Present on admission, with no sepsis.  Urine cultures positive for klebsiella pneumonia and enterococcus, resistant to ampicillin.   Plan to continue antibiotic therapy with Unasyn Iv and transition to oral Augmentin.  Continue close monitoring cell count and temperature curve.

## 2022-10-14 DIAGNOSIS — M79661 Pain in right lower leg: Secondary | ICD-10-CM | POA: Diagnosis not present

## 2022-10-14 DIAGNOSIS — G9341 Metabolic encephalopathy: Secondary | ICD-10-CM | POA: Diagnosis not present

## 2022-10-14 LAB — GLUCOSE, CAPILLARY
Glucose-Capillary: 118 mg/dL — ABNORMAL HIGH (ref 70–99)
Glucose-Capillary: 128 mg/dL — ABNORMAL HIGH (ref 70–99)
Glucose-Capillary: 146 mg/dL — ABNORMAL HIGH (ref 70–99)
Glucose-Capillary: 166 mg/dL — ABNORMAL HIGH (ref 70–99)
Glucose-Capillary: 71 mg/dL (ref 70–99)
Glucose-Capillary: 78 mg/dL (ref 70–99)

## 2022-10-14 LAB — CBC
HCT: 42.1 % (ref 36.0–46.0)
Hemoglobin: 13.6 g/dL (ref 12.0–15.0)
MCH: 28.3 pg (ref 26.0–34.0)
MCHC: 32.3 g/dL (ref 30.0–36.0)
MCV: 87.7 fL (ref 80.0–100.0)
Platelets: 143 10*3/uL — ABNORMAL LOW (ref 150–400)
RBC: 4.8 MIL/uL (ref 3.87–5.11)
RDW: 13.2 % (ref 11.5–15.5)
WBC: 9.2 10*3/uL (ref 4.0–10.5)
nRBC: 0 % (ref 0.0–0.2)

## 2022-10-14 LAB — BASIC METABOLIC PANEL
Anion gap: 12 (ref 5–15)
BUN: 9 mg/dL (ref 8–23)
CO2: 23 mmol/L (ref 22–32)
Calcium: 9 mg/dL (ref 8.9–10.3)
Chloride: 108 mmol/L (ref 98–111)
Creatinine, Ser: 0.69 mg/dL (ref 0.44–1.00)
GFR, Estimated: >60 mL/min (ref >60)
Glucose, Bld: 73 mg/dL (ref 70–99)
Potassium: 3.5 mmol/L (ref 3.5–5.1)
Sodium: 143 mmol/L (ref 135–145)

## 2022-10-14 LAB — HEMOGLOBIN A1C
Hgb A1c MFr Bld: 6.2 % — ABNORMAL HIGH (ref 4.8–5.6)
Mean Plasma Glucose: 131.24 mg/dL

## 2022-10-14 MED ORDER — SODIUM CHLORIDE 0.9 % IV SOLN
1.0000 g | INTRAVENOUS | Status: DC
  Administered 2022-10-14 – 2022-10-15 (×2): 1 g via INTRAVENOUS
  Filled 2022-10-14 (×2): qty 10

## 2022-10-14 MED ORDER — METHOCARBAMOL 500 MG PO TABS
500.0000 mg | ORAL_TABLET | Freq: Three times a day (TID) | ORAL | Status: DC | PRN
  Administered 2022-10-15 – 2022-10-21 (×7): 500 mg via ORAL
  Filled 2022-10-14 (×7): qty 1

## 2022-10-14 MED ORDER — NITROFURANTOIN MONOHYD MACRO 100 MG PO CAPS
100.0000 mg | ORAL_CAPSULE | Freq: Two times a day (BID) | ORAL | Status: DC
  Administered 2022-10-14 – 2022-10-15 (×2): 100 mg via ORAL
  Filled 2022-10-14 (×3): qty 1

## 2022-10-14 MED ORDER — SODIUM CHLORIDE 0.9 % IV SOLN: 1.0000 g | Freq: Once | INTRAVENOUS | Status: DC

## 2022-10-14 NOTE — Progress Notes (Signed)
PROGRESS NOTE    [name]  WUJ:[id number] DOB: 04/07/1946 DOA: 10/13/2022 PCP: Corwin LevinsJohn, James W, MD     Brief Narrative:   [facility].   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  Distal R femur fx managed non-operatively in 2021 (pt non ambulatory due to stroke).   Pt with increased R knee pain and confusion since a bed transfer a couple of days ago per son.  Decreased PO intake.   No report of fall.    Subjective:  Not oriented to time, reports hurting all over, not a reliable historian  Urine is dark and cloudy   Lost iv access, refused to have another placed   Assessment & Plan:  Principal Problem:   Acute metabolic encephalopathy Active Problems:   UTI (urinary tract infection)   Right knee pain   Diabetes   Hypertension   History of stroke   Right spastic hemiparesis   Partial symptomatic epilepsy with complex partial seizures, not intractable, without status epilepticus   Closed fracture of right distal femur    Assessment and Plan:   Acute metabolic encephalopathy/UTI -Likely hypoactive delirium + recrudescence of stroke symptoms in setting of possible UTI  -son reports she has decreased responsiveness, son states when his mother gets confused with decreased responsiveness from uti in the past -Empirically treat UTI , awaiting for urine culture    Right leg  pain ? Exam not reliable H/o right hemiparesis , was lifted out of bed several days ago, son reports she started to c/o pain on right side  DG right ankle no acute issues DG femur /DG right knee : "Right femoral intramedullary nail with increased perihardware lucency about the proximal aspect of the femoral component concerning for loosening. Interval  progression of the osseous remodeling about the chronic supracondylar fracture of the right femur.  Tricompartmental knee osteoarthritis." Seen by ortho , does not think hardware is an issue    Partial symptomatic epilepsy with complex partial seizures, not intractable, without status epilepticus Cont keppra  H/o CVA with Right spastic hemiparesis Cont statin  Hypertension Cont home BP meds once med rec is completed.  Insulin dependent type 2 Diabetes A1c 6.2 Home regimen: levemir 12units bid and metformin per chart review Am blood glucose 73, on ssi only currently   FTT from [facility], son does not want her mother to go back to [facility], transitional care consulted      I have Reviewed nursing notes, Vitals, pain scores, I/o's, Lab results and  imaging results since pt's last encounter, details please see discussion above  I ordered the following labs:  Unresulted Labs (From admission, onward)     Start     Ordered   10/15/22 0500  Uric acid  Tomorrow morning,   R        10/14/22 1310   10/15/22 0500  CBC with Differential/Platelet  Tomorrow morning,   R        10/14/22 1609   10/15/22 0500  Comprehensive metabolic panel  Tomorrow morning,   R        10/14/22 1609   10/15/22 0500  Magnesium  Tomorrow morning,   R        10/14/22 1609  10/13/22 2007  Urine Culture  Once,   URGENT       Question:  Indication  Answer:  Altered mental status (if no other cause identified)   10/13/22 2006             DVT prophylaxis:  apixaban (ELIQUIS) tablet 5 mg   Code Status:   Code Status: Full Code  Family Communication: son over the phone Disposition:   Status is: Observation    Dispo: The patient is from: [facility]               Anticipated d/c is to: son does not want her mother to go back to [facility]              Anticipated d/c date is: TBD  Antimicrobials:   Anti-infectives (From admission, onward)    Start     Dose/Rate Route Frequency  Ordered Stop   10/14/22 1615  nitrofurantoin (macrocrystal-monohydrate) (MACROBID) capsule 100 mg        100 mg Oral Every 12 hours 10/14/22 1525     10/14/22 1200  cefTRIAXone (ROCEPHIN) 1 g in sodium chloride 0.9 % 100 mL IVPB  Status:  Discontinued        1 g 200 mL/hr over 30 Minutes Intravenous  Once 10/14/22 1105 10/14/22 1112   10/14/22 1200  cefTRIAXone (ROCEPHIN) 1 g in sodium chloride 0.9 % 100 mL IVPB        1 g 200 mL/hr over 30 Minutes Intravenous Every 24 hours 10/14/22 1112     10/13/22 2030  cefTRIAXone (ROCEPHIN) 1 g in sodium chloride 0.9 % 100 mL IVPB        1 g 200 mL/hr over 30 Minutes Intravenous  Once 10/13/22 2026 10/13/22 2107           Objective: Vitals:   10/13/22 2314 10/14/22 0442 10/14/22 0936 10/14/22 1310  BP:  (!) 142/90 121/82 (!) 137/96  Pulse:  91 92 94  Resp:  17 18 18   Temp:  98.3 F (36.8 C) 98.2 F (36.8 C) 98.7 F (37.1 C)  TempSrc:   Oral   SpO2:  96% 97% 98%  Weight: 66.6 kg       Intake/Output Summary (Last 24 hours) at 10/14/2022 1609 Last data filed at 10/14/2022 0850 Gross per 24 hour  Intake 100 ml  Output 500 ml  Net -400 ml   Filed Weights   10/13/22 2314  Weight: 66.6 kg    Examination:  General exam: alert, awake, not oriented to time Respiratory system: Clear to auscultation. Respiratory effort normal. Cardiovascular system:  RRR.  Gastrointestinal system: Abdomen is nondistended, soft and nontender.  Normal bowel sounds heard. Central nervous system: Alert and oriented. Right hemiparesis, not able to move both legs  Extremities:  no edema Skin: No rashes, lesions or ulcers Psychiatry: no agitation currently     Data Reviewed: I have personally reviewed  labs and visualized  imaging studies since the last encounter and formulate the plan        Scheduled Meds:  apixaban  5 mg Oral BID   insulin aspart  0-9 Units Subcutaneous Q4H   levETIRAcetam  500 mg Oral BID   nitrofurantoin  (macrocrystal-monohydrate)  100 mg Oral Q12H   Continuous Infusions:  cefTRIAXone (ROCEPHIN)  IV       LOS: 0 days   Time spent:  Albertine Grates, MD PhD FACP [facility]  Available via Epic secure chat 7am-7pm for  nonurgent issues Please page for urgent issues To page the attending provider between 7A-7P or the covering provider during after hours 7P-7A, please log into the web site [email or web address] and access using universal [place] password for that web site. If you do not have the password, please call the hospital operator.    10/14/2022, 4:09 PM

## 2022-10-14 NOTE — Consult Note (Addendum)
Reason for Consult:Right hip hardware abnormality Referring Physician: Albertine GratesFang Xu Time called: 1054 Time at bedside: [id number]   [name] is an 77 y.o. female.  HPI: Melissa QuinLinda was admitted yesterday with a long hx/o RLE pain though by notes family thought it was more acute. X-rays showed some possible hardware loosening of the femur IMN and orthopedic surgery was consulted the next morning. She denies fevers, chills, sweats, N/V. She is hemiparetic on the right side and does not ambulate.  Past Medical History:  Diagnosis Date   Acute blood loss anemia    CHF (congestive heart failure) (HCC)    EF 50-55%, grade 1 diastolic dysfunction per echo 16/109610/2016   Coronary artery disease involving native artery of transplanted heart without angina pectoris    CVA (cerebral infarction) 04/2015   Started Plavix 04/2015   Depression    Diabetes (HCC) 2016   Type II. On insulin   Enchondroma of bone 2011   left femur.    Fracture, intertrochanteric, right femur (HCC)    History of lower GI bleeding    Hyperlipidemia    Hypertension    Patent foramen ovale 04/2015   Started on warfarin 04/2015   Protein calorie malnutrition (HCC)    Stercoral ulcer of rectum 05/16/2015    Past Surgical History:  Procedure Laterality Date   CARDIAC SURGERY     Cath without stent   COLONOSCOPY N/A 05/15/2015   Procedure: COLONOSCOPY;  Surgeon: Napoleon FormKavitha Nandigam V, MD;  Location: [facility] ENDOSCOPY;  Service: Endoscopy;  Laterality: N/A;   FLEXIBLE SIGMOIDOSCOPY N/A 05/15/2015   Procedure: FLEXIBLE SIGMOIDOSCOPY;  Surgeon: Napoleon FormKavitha Nandigam V, MD;  Location: [facility] ENDOSCOPY;  Service: Endoscopy;  Laterality: N/A;  at bedside   INTRAMEDULLARY (IM) NAIL INTERTROCHANTERIC Right 06/12/2015   Procedure: INTRAMEDULLARY (IM) NAIL RIGHT HIP;  Surgeon: Sheral Apleyimothy D Murphy, MD;  Location: [facility] OR;  Service: Orthopedics;  Laterality: Right;   TEE WITHOUT CARDIOVERSION N/A 05/05/2015   Procedure: TRANSESOPHAGEAL ECHOCARDIOGRAM (TEE);   Surgeon: Orpah CobbAjay Kadakia, MD;  [facility];  Service: Cardiovascular;  Laterality: N/A;    Family History  Problem Relation Age of Onset   Hypertension Mother    Heart failure Mother    Hyperlipidemia Mother    Heart attack Father     Social History:  reports that she quit smoking about 7 years ago. Her smoking use included cigarettes. She has never used smokeless tobacco. She reports that she does not drink alcohol and does not use drugs.  Allergies:  Allergies  Allergen Reactions   Pork-Derived Products Other (See Comments)    To keep blood pressure down    Medications: I have reviewed the patient's current medications.  Results for orders placed or performed during the hospital encounter of 10/13/22 (from the past 48 hour(s))  CBC     Status: Abnormal   Collection Time: 10/13/22  3:04 PM  Result Value Ref Range   WBC 10.9 (H) 4.0 - 10.5 K/uL   RBC 4.68 3.87 - 5.11 MIL/uL   Hemoglobin 13.2 12.0 - 15.0 g/dL   HCT 04.542.8 40.936.0 - 81.146.0 %   MCV 91.5 80.0 - 100.0 fL   MCH 28.2 26.0 - 34.0 pg   MCHC 30.8 30.0 - 36.0 g/dL   RDW 91.413.3 78.211.5 - 95.615.5 %   Platelets 163 150 - 400 K/uL   nRBC 0.0 0.0 - 0.2 %    Comment: Performed at [facility], [address] [place], [place] [address]  Basic metabolic  panel     Status: None   Collection Time: 10/13/22  3:04 PM  Result Value Ref Range   Sodium 143 135 - 145 mmol/L   Potassium 3.8 3.5 - 5.1 mmol/L   Chloride 107 98 - 111 mmol/L   CO2 23 22 - 32 mmol/L   Glucose, Bld 94 70 - 99 mg/dL    Comment: Glucose reference range applies only to samples taken after fasting for at least 8 hours.   BUN 9 8 - 23 mg/dL   Creatinine, Ser 1.61 0.44 - 1.00 mg/dL   Calcium 9.0 8.9 - 09.6 mg/dL   GFR, Estimated >04 >54 mL/min    Comment: (NOTE) Calculated using the CKD-EPI Creatinine Equation (2021)    Anion gap 13 5 - 15    Comment: Performed at [facility], [address], [place], [place] [address]  Urinalysis, Routine w  reflex microscopic -Urine, Catheterized     Status: Abnormal   Collection Time: 10/13/22  4:45 PM  Result Value Ref Range   Color, Urine YELLOW YELLOW   APPearance CLOUDY (A) CLEAR   Specific Gravity, Urine 1.011 1.005 - 1.030   pH 6.0 5.0 - 8.0   Glucose, UA NEGATIVE NEGATIVE mg/dL   Hgb urine dipstick MODERATE (A) NEGATIVE   Bilirubin Urine NEGATIVE NEGATIVE   Ketones, ur NEGATIVE NEGATIVE mg/dL   Protein, ur 30 (A) NEGATIVE mg/dL   Nitrite POSITIVE (A) NEGATIVE   Leukocytes,Ua LARGE (A) NEGATIVE   RBC / HPF 6-10 0 - 5 RBC/hpf   WBC, UA >50 0 - 5 WBC/hpf   Bacteria, UA RARE (A) NONE SEEN   Squamous Epithelial / HPF 0-5 0 - 5 /HPF    Comment: Performed at [facility], [address], [place], [place] [address]  Glucose, capillary     Status: Abnormal   Collection Time: 10/13/22 10:26 PM  Result Value Ref Range   Glucose-Capillary 107 (H) 70 - 99 mg/dL    Comment: Glucose reference range applies only to samples taken after fasting for at least 8 hours.  Hemoglobin A1c     Status: Abnormal   Collection Time: 10/13/22 11:06 PM  Result Value Ref Range   Hgb A1c MFr Bld 6.2 (H) 4.8 - 5.6 %    Comment: (NOTE) Pre diabetes:          5.7%-6.4%  Diabetes:              >6.4%  Glycemic control for   <7.0% adults with diabetes    Mean Plasma Glucose 131.24 mg/dL    Comment: Performed at [facility], [address] [place], [place] [address]  Glucose, capillary     Status: Abnormal   Collection Time: 10/14/22 12:08 AM  Result Value Ref Range   Glucose-Capillary 118 (H) 70 - 99 mg/dL    Comment: Glucose reference range applies only to samples taken after fasting for at least 8 hours.  Glucose, capillary     Status: None   Collection Time: 10/14/22  4:08 AM  Result Value Ref Range   Glucose-Capillary 71 70 - 99 mg/dL    Comment: Glucose reference range applies only to samples taken after fasting for at least 8 hours.  CBC     Status: Abnormal   Collection Time:  10/14/22  4:37 AM  Result Value Ref Range   WBC 9.2 4.0 - 10.5 K/uL   RBC 4.80 3.87 - 5.11 MIL/uL   Hemoglobin 13.6 12.0 -  15.0 g/dL   HCT 71.0 62.6 - 94.8 %   MCV 87.7 80.0 - 100.0 fL   MCH 28.3 26.0 - 34.0 pg   MCHC 32.3 30.0 - 36.0 g/dL   RDW 54.6 27.0 - 35.0 %   Platelets 143 (L) 150 - 400 K/uL   nRBC 0.0 0.0 - 0.2 %    Comment: Performed at [facility], [address], [place], [place] [address]  Basic metabolic panel     Status: None   Collection Time: 10/14/22  4:37 AM  Result Value Ref Range   Sodium 143 135 - 145 mmol/L   Potassium 3.5 3.5 - 5.1 mmol/L   Chloride 108 98 - 111 mmol/L   CO2 23 22 - 32 mmol/L   Glucose, Bld 73 70 - 99 mg/dL    Comment: Glucose reference range applies only to samples taken after fasting for at least 8 hours.   BUN 9 8 - 23 mg/dL   Creatinine, Ser 8.29 0.44 - 1.00 mg/dL   Calcium 9.0 8.9 - 93.7 mg/dL   GFR, Estimated >16 >96 mL/min    Comment: (NOTE) Calculated using the CKD-EPI Creatinine Equation (2021)    Anion gap 12 5 - 15    Comment: Performed at [facility], [address] [place], [place] [address]  Glucose, capillary     Status: None   Collection Time: 10/14/22  9:21 AM  Result Value Ref Range   Glucose-Capillary 78 70 - 99 mg/dL    Comment: Glucose reference range applies only to samples taken after fasting for at least 8 hours.    DG Ankle 2 Views Right  Result Date: 10/13/2022 CLINICAL DATA:  Ankle pain EXAM: RIGHT ANKLE - 2 VIEW COMPARISON:  None Available. FINDINGS: Diffuse osteopenia. No acute bony abnormality. Specifically, no fracture, subluxation, or dislocation. Calcaneal spur. Vascular calcifications. Soft tissues are intact. IMPRESSION: No acute bony abnormality. Electronically Signed   By: Charlett Nose M.D.   On: 10/13/2022 21:28   DG FEMUR, MIN 2 VIEWS RIGHT  Result Date: 10/13/2022 CLINICAL DATA:  Patient is showing signs of pain with any movement of the right leg. Patient is confused unable to  give a good history. History of right femoral nail placement. EXAM: RIGHT KNEE - COMPLETE 4+ VIEW; RIGHT FEMUR 2 VIEWS COMPARISON:  Radiographs dated April 05, 2020 FINDINGS: Right femur: Right femoral intramedullary nail with diffuse osteopenia. Compared to prior examination there is increased perihardware lucency about the proximal aspect of the femoral component concerning for loosening. Interval progression of the osseous remodeling about distal femoral supracondylar fracture. Diffuse osteopenia limits evaluation. Right knee: Osseous remodeling about the chronic supracondylar fracture of the right femur. Tricompartmental knee joint space narrowing and marginal osteophytes. No significant joint effusion. a Diffuse osteopenia. Vascular calcifications. IMPRESSION: Right femur: 1. Right femoral intramedullary nail with increased perihardware lucency about the proximal aspect of the femoral component concerning for loosening. Right knee: 1. Interval progression of the osseous remodeling about the chronic supracondylar fracture of the right femur. 2. Tricompartmental knee osteoarthritis. Electronically Signed   By: Larose Hires D.O.   On: 10/13/2022 15:57   DG Knee Complete 4 Views Right  Result Date: 10/13/2022 CLINICAL DATA:  Patient is showing signs of pain with any movement of the right leg. Patient is confused unable to give a good history. History of right femoral nail placement. EXAM: RIGHT KNEE - COMPLETE 4+ VIEW; RIGHT FEMUR 2 VIEWS COMPARISON:  Radiographs dated April 05, 2020 FINDINGS: Right femur: Right femoral intramedullary nail with diffuse osteopenia. Compared to prior examination there is increased perihardware lucency about the proximal aspect of the femoral component concerning for loosening. Interval progression of the osseous remodeling about distal femoral supracondylar fracture. Diffuse osteopenia limits evaluation. Right knee: Osseous remodeling about the chronic supracondylar fracture of  the right femur. Tricompartmental knee joint space narrowing and marginal osteophytes. No significant joint effusion. a Diffuse osteopenia. Vascular calcifications. IMPRESSION: Right femur: 1. Right femoral intramedullary nail with increased perihardware lucency about the proximal aspect of the femoral component concerning for loosening. Right knee: 1. Interval progression of the osseous remodeling about the chronic supracondylar fracture of the right femur. 2. Tricompartmental knee osteoarthritis. Electronically Signed   By: Larose Hires D.O.   On: 10/13/2022 15:57    Review of Systems  Constitutional:  Negative for chills, diaphoresis and fever.  HENT:  Negative for ear discharge, ear pain, hearing loss and tinnitus.   Eyes:  Negative for photophobia and pain.  Respiratory:  Negative for cough and shortness of breath.   Cardiovascular:  Negative for chest pain.  Gastrointestinal:  Negative for abdominal pain, nausea and vomiting.  Genitourinary:  Negative for dysuria, flank pain, frequency and urgency.  Musculoskeletal:  Positive for arthralgias (RLE). Negative for back pain, myalgias and neck pain.  Neurological:  Negative for dizziness and headaches.  Hematological:  Does not bruise/bleed easily.  Psychiatric/Behavioral:  The patient is not nervous/anxious.    Blood pressure 121/82, pulse 92, temperature 98.2 F (36.8 C), temperature source Oral, resp. rate 18, weight 66.6 kg, SpO2 97 %. Physical Exam Constitutional:      General: She is not in acute distress.    Appearance: She is well-developed. She is not diaphoretic.  HENT:     Head: Normocephalic and atraumatic.  Eyes:     General: No scleral icterus.       Right eye: No discharge.        Left eye: No discharge.     Conjunctiva/sclera: Conjunctivae normal.  Cardiovascular:     Rate and Rhythm: Normal rate and regular rhythm.  Pulmonary:     Effort: Pulmonary effort is normal. No respiratory distress.  Musculoskeletal:      Cervical back: Normal range of motion.     Comments: RLE No traumatic wounds, ecchymosis, or rash  Diffuse pain to light touch from thigh to foot, worsens the more distal you go, mild in thigh to severe in foot. Severe pain with attempted PROM hip/knee/ankle  No knee or ankle effusion  Sens DPN, SPN, TN intact  Motor EHL, ext, flex, evers absent  DP 2+, PT 2+, No significant edema  Skin:    General: Skin is warm and dry.  Neurological:     Mental Status: She is alert.  Psychiatric:        Mood and Affect: Mood normal.        Behavior: Behavior normal.     Assessment/Plan: RLE pain -- The hardware is not an issue per se in this non-ambulator. I do not think it is causing any of her pain. Would favor a radicular or peripheral source given the extent and sensitivity. Will check a uric acid in case this is extraarticular gout but think workup needs to continue.    Freeman Caldron, PA-C Orthopedic Surgery [phone] 10/14/2022, 11:16 AM

## 2022-10-14 NOTE — Progress Notes (Signed)
Patient refused IV placement and insulin. Working on getting son to come back down here to get IV placement and IV antibiotics started.

## 2022-10-15 ENCOUNTER — Observation Stay (HOSPITAL_COMMUNITY): Payer: Medicare PPO

## 2022-10-15 DIAGNOSIS — Y929 Unspecified place or not applicable: Secondary | ICD-10-CM | POA: Diagnosis not present

## 2022-10-15 DIAGNOSIS — E1159 Type 2 diabetes mellitus with other circulatory complications: Secondary | ICD-10-CM | POA: Diagnosis not present

## 2022-10-15 DIAGNOSIS — N390 Urinary tract infection, site not specified: Secondary | ICD-10-CM | POA: Diagnosis present

## 2022-10-15 DIAGNOSIS — I10 Essential (primary) hypertension: Secondary | ICD-10-CM | POA: Diagnosis not present

## 2022-10-15 DIAGNOSIS — Z1611 Resistance to penicillins: Secondary | ICD-10-CM | POA: Diagnosis present

## 2022-10-15 DIAGNOSIS — B961 Klebsiella pneumoniae [K. pneumoniae] as the cause of diseases classified elsewhere: Secondary | ICD-10-CM | POA: Diagnosis present

## 2022-10-15 DIAGNOSIS — Z7401 Bed confinement status: Secondary | ICD-10-CM | POA: Diagnosis not present

## 2022-10-15 DIAGNOSIS — X58XXXA Exposure to other specified factors, initial encounter: Secondary | ICD-10-CM | POA: Diagnosis present

## 2022-10-15 DIAGNOSIS — G9341 Metabolic encephalopathy: Secondary | ICD-10-CM | POA: Diagnosis present

## 2022-10-15 DIAGNOSIS — T84030A Mechanical loosening of internal right hip prosthetic joint, initial encounter: Secondary | ICD-10-CM | POA: Diagnosis present

## 2022-10-15 DIAGNOSIS — Z79899 Other long term (current) drug therapy: Secondary | ICD-10-CM | POA: Diagnosis not present

## 2022-10-15 DIAGNOSIS — N3000 Acute cystitis without hematuria: Secondary | ICD-10-CM | POA: Diagnosis present

## 2022-10-15 DIAGNOSIS — Z87891 Personal history of nicotine dependence: Secondary | ICD-10-CM | POA: Diagnosis not present

## 2022-10-15 DIAGNOSIS — G40209 Localization-related (focal) (partial) symptomatic epilepsy and epileptic syndromes with complex partial seizures, not intractable, without status epilepticus: Secondary | ICD-10-CM | POA: Diagnosis present

## 2022-10-15 DIAGNOSIS — G8929 Other chronic pain: Secondary | ICD-10-CM | POA: Diagnosis present

## 2022-10-15 DIAGNOSIS — R609 Edema, unspecified: Secondary | ICD-10-CM | POA: Diagnosis not present

## 2022-10-15 DIAGNOSIS — E785 Hyperlipidemia, unspecified: Secondary | ICD-10-CM | POA: Diagnosis present

## 2022-10-15 DIAGNOSIS — Z794 Long term (current) use of insulin: Secondary | ICD-10-CM | POA: Diagnosis not present

## 2022-10-15 DIAGNOSIS — Q2112 Patent foramen ovale: Secondary | ICD-10-CM | POA: Diagnosis not present

## 2022-10-15 DIAGNOSIS — E119 Type 2 diabetes mellitus without complications: Secondary | ICD-10-CM | POA: Diagnosis present

## 2022-10-15 DIAGNOSIS — R5381 Other malaise: Secondary | ICD-10-CM | POA: Diagnosis present

## 2022-10-15 DIAGNOSIS — I5032 Chronic diastolic (congestive) heart failure: Secondary | ICD-10-CM | POA: Diagnosis present

## 2022-10-15 DIAGNOSIS — S82141A Displaced bicondylar fracture of right tibia, initial encounter for closed fracture: Secondary | ICD-10-CM | POA: Diagnosis present

## 2022-10-15 DIAGNOSIS — M79609 Pain in unspecified limb: Secondary | ICD-10-CM | POA: Diagnosis not present

## 2022-10-15 DIAGNOSIS — I69351 Hemiplegia and hemiparesis following cerebral infarction affecting right dominant side: Secondary | ICD-10-CM | POA: Diagnosis not present

## 2022-10-15 DIAGNOSIS — F32A Depression, unspecified: Secondary | ICD-10-CM | POA: Diagnosis present

## 2022-10-15 DIAGNOSIS — Y792 Prosthetic and other implants, materials and accessory orthopedic devices associated with adverse incidents: Secondary | ICD-10-CM | POA: Diagnosis present

## 2022-10-15 DIAGNOSIS — S82151A Displaced fracture of right tibial tuberosity, initial encounter for closed fracture: Secondary | ICD-10-CM | POA: Diagnosis present

## 2022-10-15 DIAGNOSIS — I110 Hypertensive heart disease with heart failure: Secondary | ICD-10-CM | POA: Diagnosis present

## 2022-10-15 DIAGNOSIS — R509 Fever, unspecified: Secondary | ICD-10-CM | POA: Diagnosis not present

## 2022-10-15 DIAGNOSIS — K449 Diaphragmatic hernia without obstruction or gangrene: Secondary | ICD-10-CM | POA: Diagnosis not present

## 2022-10-15 DIAGNOSIS — E876 Hypokalemia: Secondary | ICD-10-CM | POA: Diagnosis present

## 2022-10-15 DIAGNOSIS — R627 Adult failure to thrive: Secondary | ICD-10-CM | POA: Diagnosis present

## 2022-10-15 LAB — URINE CULTURE

## 2022-10-15 LAB — COMPREHENSIVE METABOLIC PANEL
ALT: 18 U/L (ref 0–44)
AST: 15 U/L (ref 15–41)
Albumin: 3 g/dL — ABNORMAL LOW (ref 3.5–5.0)
Alkaline Phosphatase: 58 U/L (ref 38–126)
Anion gap: 9 (ref 5–15)
BUN: 8 mg/dL (ref 8–23)
CO2: 23 mmol/L (ref 22–32)
Calcium: 8.6 mg/dL — ABNORMAL LOW (ref 8.9–10.3)
Chloride: 108 mmol/L (ref 98–111)
Creatinine, Ser: 0.75 mg/dL (ref 0.44–1.00)
GFR, Estimated: >60 mL/min (ref >60)
Glucose, Bld: 132 mg/dL — ABNORMAL HIGH (ref 70–99)
Potassium: 3.4 mmol/L — ABNORMAL LOW (ref 3.5–5.1)
Sodium: 140 mmol/L (ref 135–145)
Total Bilirubin: 0.9 mg/dL (ref 0.3–1.2)
Total Protein: 6.1 g/dL — ABNORMAL LOW (ref 6.5–8.1)

## 2022-10-15 LAB — CBC WITH DIFFERENTIAL/PLATELET
Abs Immature Granulocytes: 0.02 10*3/uL (ref 0.00–0.07)
Basophils Absolute: 0 10*3/uL (ref 0.0–0.1)
Basophils Relative: 0 %
Eosinophils Absolute: 0.5 10*3/uL (ref 0.0–0.5)
Eosinophils Relative: 6 %
HCT: 39.5 % (ref 36.0–46.0)
Hemoglobin: 12.9 g/dL (ref 12.0–15.0)
Immature Granulocytes: 0 %
Lymphocytes Relative: 27 %
Lymphs Abs: 2.5 10*3/uL (ref 0.7–4.0)
MCH: 28.1 pg (ref 26.0–34.0)
MCHC: 32.7 g/dL (ref 30.0–36.0)
MCV: 86.1 fL (ref 80.0–100.0)
Monocytes Absolute: 0.7 10*3/uL (ref 0.1–1.0)
Monocytes Relative: 7 %
Neutro Abs: 5.4 10*3/uL (ref 1.7–7.7)
Neutrophils Relative %: 60 %
Platelets: 144 10*3/uL — ABNORMAL LOW (ref 150–400)
RBC: 4.59 MIL/uL (ref 3.87–5.11)
RDW: 13.2 % (ref 11.5–15.5)
WBC: 9.1 10*3/uL (ref 4.0–10.5)
nRBC: 0 % (ref 0.0–0.2)

## 2022-10-15 LAB — GLUCOSE, CAPILLARY
Glucose-Capillary: 115 mg/dL — ABNORMAL HIGH (ref 70–99)
Glucose-Capillary: 121 mg/dL — ABNORMAL HIGH (ref 70–99)
Glucose-Capillary: 139 mg/dL — ABNORMAL HIGH (ref 70–99)
Glucose-Capillary: 142 mg/dL — ABNORMAL HIGH (ref 70–99)
Glucose-Capillary: 164 mg/dL — ABNORMAL HIGH (ref 70–99)
Glucose-Capillary: 196 mg/dL — ABNORMAL HIGH (ref 70–99)

## 2022-10-15 LAB — MAGNESIUM: Magnesium: 1.5 mg/dL — ABNORMAL LOW (ref 1.7–2.4)

## 2022-10-15 LAB — URIC ACID: Uric Acid, Serum: 6.2 mg/dL (ref 2.5–7.1)

## 2022-10-15 MED ORDER — SENNOSIDES-DOCUSATE SODIUM 8.6-50 MG PO TABS
1.0000 | ORAL_TABLET | Freq: Two times a day (BID) | ORAL | Status: AC
  Administered 2022-10-15 – 2022-10-16 (×4): 1 via ORAL
  Filled 2022-10-15 (×4): qty 1

## 2022-10-15 MED ORDER — GABAPENTIN 100 MG PO CAPS
100.0000 mg | ORAL_CAPSULE | Freq: Every day | ORAL | Status: DC
  Administered 2022-10-15 – 2022-10-19 (×5): 100 mg via ORAL
  Filled 2022-10-15 (×5): qty 1

## 2022-10-15 MED ORDER — POTASSIUM CHLORIDE CRYS ER 20 MEQ PO TBCR
40.0000 meq | EXTENDED_RELEASE_TABLET | Freq: Once | ORAL | Status: AC
  Administered 2022-10-15: 40 meq via ORAL
  Filled 2022-10-15: qty 2

## 2022-10-15 MED ORDER — MAGNESIUM SULFATE 2 GM/50ML IV SOLN
2.0000 g | Freq: Once | INTRAVENOUS | Status: AC
  Administered 2022-10-15: 2 g via INTRAVENOUS
  Filled 2022-10-15: qty 50

## 2022-10-15 MED ORDER — PIPERACILLIN-TAZOBACTAM 3.375 G IVPB
3.3750 g | Freq: Three times a day (TID) | INTRAVENOUS | Status: DC
  Administered 2022-10-15 – 2022-10-17 (×6): 3.375 g via INTRAVENOUS
  Filled 2022-10-15 (×6): qty 50

## 2022-10-15 NOTE — Progress Notes (Signed)
Pharmacy Antibiotic Note  [name] is a 77 y.o. female admitted on 10/13/2022 with encephalopathy. Pharmacy has been consulted for zosyn dosing.  Patient has been on ceftriaxone and nitrofurantoin 4/10. Patient now febrile at 100.5.   Plan: Zosyn 3.375g IV q8h (extended infusion) F/u Ucx sensitivities, clinical improvement, ability to narrow antibiotics F/u further infectious w/u  Weight: 66.6 kg (146 lb 13.2 oz)  Temp (24hrs), Avg:99 F (37.2 C), Min:97.4 F (36.3 C), Max:100.5 F (38.1 C)  Recent Labs  Lab 10/13/22 1504 10/14/22 0437 10/15/22 0522  WBC 10.9* 9.2 9.1  CREATININE 0.94 0.69 0.75    CrCl cannot be calculated (Unknown ideal weight.).    Allergies  Allergen Reactions   Pork-Derived Products Other (See Comments)    To keep blood pressure down    Antimicrobials this admission: Ceftriaxone 4/10 >> 4/12 Nitrofurantoin 4/10 >> 4/11 Zosyn 4/12 >>  Dose adjustments this admission:  Microbiology results: 4/12 BCx: ip 4/10 UCx: >100K Kleb pneumo, 20K Efaecalis   Thank you for allowing pharmacy to be a part of this patient's care.  Rexford Maus, PharmD, BCPS 10/15/2022 2:16 PM

## 2022-10-15 NOTE — Plan of Care (Signed)
Patient AOX2, disoriented to time and situation.  VSS throughout shift.  Diminished lungs, IS encouraged.  Purewick in place, BM this shift.  All meds given on time as ordered.  POC maintained, will continue to monitor.  Problem: Education: Goal: Ability to describe self-care measures that may prevent or decrease complications (Diabetes Survival Skills Education) will improve Outcome: Progressing Goal: Individualized Educational Video(s) Outcome: Progressing   Problem: Coping: Goal: Ability to adjust to condition or change in health will improve Outcome: Progressing   Problem: Fluid Volume: Goal: Ability to maintain a balanced intake and output will improve Outcome: Progressing   Problem: Health Behavior/Discharge Planning: Goal: Ability to identify and utilize available resources and services will improve Outcome: Progressing Goal: Ability to manage health-related needs will improve Outcome: Progressing   Problem: Metabolic: Goal: Ability to maintain appropriate glucose levels will improve Outcome: Progressing   Problem: Nutritional: Goal: Maintenance of adequate nutrition will improve Outcome: Progressing Goal: Progress toward achieving an optimal weight will improve Outcome: Progressing   Problem: Skin Integrity: Goal: Risk for impaired skin integrity will decrease Outcome: Progressing   Problem: Tissue Perfusion: Goal: Adequacy of tissue perfusion will improve Outcome: Progressing   Problem: Education: Goal: Knowledge of General Education information will improve Description: Including pain rating scale, medication(s)/side effects and non-pharmacologic comfort measures Outcome: Progressing   Problem: Health Behavior/Discharge Planning: Goal: Ability to manage health-related needs will improve Outcome: Progressing   Problem: Clinical Measurements: Goal: Ability to maintain clinical measurements within normal limits will improve Outcome: Progressing Goal: Will  remain free from infection Outcome: Progressing Goal: Diagnostic test results will improve Outcome: Progressing Goal: Respiratory complications will improve Outcome: Progressing Goal: Cardiovascular complication will be avoided Outcome: Progressing   Problem: Activity: Goal: Risk for activity intolerance will decrease Outcome: Progressing   Problem: Nutrition: Goal: Adequate nutrition will be maintained Outcome: Progressing   Problem: Coping: Goal: Level of anxiety will decrease Outcome: Progressing   Problem: Elimination: Goal: Will not experience complications related to bowel motility Outcome: Progressing Goal: Will not experience complications related to urinary retention Outcome: Progressing   Problem: Pain Managment: Goal: General experience of comfort will improve Outcome: Progressing   Problem: Safety: Goal: Ability to remain free from injury will improve Outcome: Progressing   Problem: Skin Integrity: Goal: Risk for impaired skin integrity will decrease Outcome: Progressing

## 2022-10-15 NOTE — TOC Initial Note (Addendum)
Transition of Care [facility]) - Initial/Assessment Note    Patient Details  Name: [name] MRN: [id number] Date of Birth: [birth year]  Transition of Care [facility]) CM/SW Contact:    Erin Sons, LCSW Phone Number: 10/15/2022, 11:10 AM  Clinical Narrative:                  CSW called pt son, Aurther Loft, to discuss disposition. Pt is LTC at [facility]. Son is wanting to explore alternative SNF options; [facility] already made aware by son. They will hold pt's bed through the weekend. Fl2 completed and bed requests sent in hub.   1530: CSW called pts son and provided SNF bed offers. He's considering [facility] out of current choices; they would be able to offer LTC bed as early as Monday. He wanted to check with a few facilities in [place] area prior to making decision. CSW expanded SNF search to [facility] area facilities discussed with son.   Expected Discharge Plan: Skilled Nursing Facility Barriers to Discharge: Continued Medical Work up   Patient Goals and CMS Choice            Expected Discharge Plan and Services       Living arrangements for the past 2 months: Skilled Nursing Facility                                      Prior Living Arrangements/Services Living arrangements for the past 2 months: Skilled Nursing Facility Lives with:: Facility Resident Patient language and need for interpreter reviewed:: Yes        Need for Family Participation in Patient Care: Yes (Comment) Care giver support system in place?: Yes (comment)   Criminal Activity/Legal Involvement Pertinent to Current Situation/Hospitalization: No - Comment as needed  Activities of Daily Living Home Assistive Devices/Equipment: Wheelchair ADL Screening (condition at time of admission) Patient's cognitive ability adequate to safely complete daily activities?: Yes Is the patient deaf or have difficulty hearing?: No Does the patient have difficulty seeing, even when wearing glasses/contacts?:  No Does the patient have difficulty concentrating, remembering, or making decisions?: No Patient able to express need for assistance with ADLs?: Yes Does the patient have difficulty dressing or bathing?: Yes Independently performs ADLs?: No Communication: Independent Dressing (OT): Needs assistance Is this a change from baseline?: Change from baseline, expected to last <3days Grooming: Needs assistance Is this a change from baseline?: Change from baseline, expected to last <3 days Feeding: Independent Bathing: Needs assistance Is this a change from baseline?: Change from baseline, expected to last <3 days Toileting: Needs assistance Is this a change from baseline?: Change from baseline, expected to last <3 days In/Out Bed: Needs assistance Is this a change from baseline?: Change from baseline, expected to last <3 days Walks in Home: Needs assistance Is this a change from baseline?: Change from baseline, expected to last <3 days Does the patient have difficulty walking or climbing stairs?: Yes Weakness of Legs: Right Weakness of Arms/Hands: Right  Permission Sought/Granted                  Emotional Assessment       Orientation: : Oriented to Self, Oriented to Place Alcohol / Substance Use: Not Applicable Psych Involvement: No (comment)  Admission diagnosis:  Acute cystitis without hematuria [N30.00] Generalized weakness [R53.1] Acute pain of right knee [M25.561] Acute metabolic encephalopathy [G93.41] Patient Active Problem List  Diagnosis Date Noted   Right knee pain 10/13/2022   Hypoglycemia 03/28/2022   Acute metabolic encephalopathy 03/28/2022   UTI (urinary tract infection) 03/28/2022   Encounter for well adult exam with abnormal findings 07/27/2021   HLD (hyperlipidemia) 04/14/2021   Vitamin D deficiency 04/14/2021   B12 deficiency 04/14/2021   Rupture of left biceps tendon 06/19/2020   Goals of care, counseling/discussion    Palliative care by specialist     DNR (do not resuscitate) discussion    Sepsis without acute organ dysfunction 04/05/2020   AKI (acute kidney injury) 04/05/2020   Hyperkalemia 04/05/2020   Abnormal LFTs 04/05/2020   Mild protein malnutrition 04/05/2020   Grade I diastolic dysfunction 04/05/2020   Closed fracture of right distal femur 04/05/2020   Prolonged QT interval 04/05/2020   Dysuria 06/12/2019   Preventative health care 08/18/2018   Partial symptomatic epilepsy with complex partial seizures, not intractable, without status epilepticus 12/08/2017   Shingles 10/26/2017   Constipation 04/20/2017   Skin ulcer of right heel, limited to breakdown of skin 02/10/2017   Muscle spasticity 01/20/2016   Bilateral lower extremity edema 12/23/2015   Acute confusional state 10/28/2015   Right spastic hemiparesis 10/07/2015   Right shoulder pain 07/24/2015   Fracture, intertrochanteric, right femur 06/10/2015   Stercoral ulcer of rectum 05/16/2015   GI bleed 05/13/2015   BRBPR (bright red blood per rectum) 05/13/2015   CHF (congestive heart failure)    Diabetes    Hypertension    History of stroke    Lower GI bleed    Left-sided cerebrovascular accident (CVA) 04/28/2015   PCP:  Corwin Levins, MD Pharmacy:   [facility] [id number] ([place]), [place] - [address] [place] ([place]) [place] [address] Phone: [phone] Fax: [phone]     Social Determinants of Health (SDOH) Social History: SDOH Screenings   Food Insecurity: No Food Insecurity (10/13/2022)  Housing: Low Risk  (10/13/2022)  Transportation Needs: No Transportation Needs (10/13/2022)  Utilities: Not At Risk (10/13/2022)  Depression (PHQ2-9): Low Risk  (02/10/2022)  Tobacco Use: Medium Risk (03/28/2022)   SDOH Interventions:     Readmission Risk Interventions     No data to display

## 2022-10-15 NOTE — [place] FL2 (Signed)
[place] MEDICAID FL2 LEVEL OF CARE FORM     IDENTIFICATION  Patient Name: [name] Birthdate: [birth year] Sex: female Admission Date (Current Location): 10/13/2022  [facility] and [place] Number:  Producer, television/film/video and Address:  [facility] [place], [address], [place], [place] [address]      Provider Number: [id number]  Attending Physician Name and Address:  Albertine Grates, MD  Relative Name and Phone Number:  Erroll Luna [phone])  [phone] [facility] Phone)    Current Level of Care: Hospital Recommended Level of Care: Skilled Nursing Facility Prior Approval Number:    Date Approved/Denied:   PASRR Number:    Discharge Plan: SNF    Current Diagnoses: Patient Active Problem List   Diagnosis Date Noted   Right knee pain 10/13/2022   Hypoglycemia 03/28/2022   Acute metabolic encephalopathy 03/28/2022   UTI (urinary tract infection) 03/28/2022   Encounter for well adult exam with abnormal findings 07/27/2021   HLD (hyperlipidemia) 04/14/2021   Vitamin D deficiency 04/14/2021   B12 deficiency 04/14/2021   Rupture of left biceps tendon 06/19/2020   Goals of care, counseling/discussion    Palliative care by specialist    DNR (do not resuscitate) discussion    Sepsis without acute organ dysfunction 04/05/2020   AKI (acute kidney injury) 04/05/2020   Hyperkalemia 04/05/2020   Abnormal LFTs 04/05/2020   Mild protein malnutrition 04/05/2020   Grade I diastolic dysfunction 04/05/2020   Closed fracture of right distal femur 04/05/2020   Prolonged QT interval 04/05/2020   Dysuria 06/12/2019   Preventative health care 08/18/2018   Partial symptomatic epilepsy with complex partial seizures, not intractable, without status epilepticus 12/08/2017   Shingles 10/26/2017   Constipation 04/20/2017   Skin ulcer of right heel, limited to breakdown of skin 02/10/2017   Muscle spasticity 01/20/2016   Bilateral lower extremity edema 12/23/2015   Acute confusional  state 10/28/2015   Right spastic hemiparesis 10/07/2015   Right shoulder pain 07/24/2015   Fracture, intertrochanteric, right femur 06/10/2015   Stercoral ulcer of rectum 05/16/2015   GI bleed 05/13/2015   BRBPR (bright red blood per rectum) 05/13/2015   CHF (congestive heart failure)    Diabetes    Hypertension    History of stroke    Lower GI bleed    Left-sided cerebrovascular accident (CVA) 04/28/2015    Orientation RESPIRATION BLADDER Height & Weight     Self, Place  Normal Incontinent Weight: 146 lb 13.2 oz (66.6 kg) Height:     BEHAVIORAL SYMPTOMS/MOOD NEUROLOGICAL BOWEL NUTRITION STATUS      Incontinent Diet (see d/c summary)  AMBULATORY STATUS COMMUNICATION OF NEEDS Skin   Extensive Assist Verbally Normal                       Personal Care Assistance Level of Assistance  Bathing, Feeding, Dressing Bathing Assistance: Limited assistance Feeding assistance: Independent Dressing Assistance: Limited assistance     Functional Limitations Info  Sight, Hearing, Speech Sight Info: Adequate Hearing Info: Adequate Speech Info: Adequate    SPECIAL CARE FACTORS FREQUENCY                       Contractures Contractures Info: Not present    Additional Factors Info  Code Status, Allergies Code Status Info: full code Allergies Info: pork-derived products           Current Medications (10/15/2022):  This is the current hospital active medication list  Current Facility-Administered Medications  Medication Dose Route Frequency Provider Last Rate Last Admin   acetaminophen (TYLENOL) tablet 650 mg  650 mg Oral Q6H PRN Hillary Bow, DO   650 mg at 10/15/22 0601   Or   acetaminophen (TYLENOL) suppository 650 mg  650 mg Rectal Q6H PRN Hillary Bow, DO       apixaban Everlene Balls) tablet 5 mg  5 mg Oral BID Lyda Perone M, DO   5 mg at 10/15/22 0940   cefTRIAXone (ROCEPHIN) 1 g in sodium chloride 0.9 % 100 mL IVPB  1 g Intravenous Q24H Albertine Grates, MD    Stopped at 10/14/22 1906   insulin aspart (novoLOG) injection 0-9 Units  0-9 Units Subcutaneous Q4H Hillary Bow, DO   2 Units at 10/15/22 0941   levETIRAcetam (KEPPRA) tablet 500 mg  500 mg Oral BID Hillary Bow, DO   500 mg at 10/15/22 0940   methocarbamol (ROBAXIN) tablet 500 mg  500 mg Oral Q8H PRN Albertine Grates, MD   500 mg at 10/15/22 0601   nitrofurantoin (macrocrystal-monohydrate) (MACROBID) capsule 100 mg  100 mg Oral Q12H Albertine Grates, MD   100 mg at 10/14/22 1823   senna-docusate (Senokot-S) tablet 1 tablet  1 tablet Oral BID Albertine Grates, MD   1 tablet at 10/15/22 0940     Discharge Medications: Please see discharge summary for a list of discharge medications.  Relevant Imaging Results:  Relevant Lab Results:   Additional Information SS#: [id number]  Erin Sons, LCSW

## 2022-10-15 NOTE — Progress Notes (Signed)
PROGRESS NOTE    [name]  ZOX:[id number] DOB: 1946/02/22 DOA: 10/13/2022 PCP: Corwin Levins, MD     Brief Narrative:   [name] reports pt is coming from [facility].   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  Distal R femur fx managed non-operatively in 2021 (pt non ambulatory due to stroke).   Pt with increased R knee pain and confusion since a bed transfer a couple of days ago per son.  Decreased PO intake.   No report of fall.    Subjective:  Developed fever, Urine is dark and cloudy  She is alert and interactive  Not oriented to time, reports hurting all over, not a reliable historian  Assessment & Plan:  Principal Problem:   Acute metabolic encephalopathy Active Problems:   UTI (urinary tract infection)   Right knee pain   Diabetes   Hypertension   History of stroke   Right spastic hemiparesis   Partial symptomatic epilepsy with complex partial seizures, not intractable, without status epilepticus   Closed fracture of right distal femur    Assessment and Plan:   Acute metabolic encephalopathy/UTI -Likely hypoactive delirium + recrudescence of stroke symptoms in setting of possible UTI  -son reports she has decreased responsiveness, son states when his mother gets confused with decreased responsiveness from uti in the past - urine culture + klebsiella pneumoniae, per past culture result she has ESBL, will change to zosyn for past sensitivity result ( avoid cipro and imipenem due to h/o seizure)  Developed fever, add blood culture, check chest x-ray  Hypokalemia/hypomagnesemia, replace and recheck in the morning   Right leg  pain ? Exam not reliable H/o right hemiparesis , was lifted out of bed several days ago, son reports she started to c/o  pain on right side  DG right ankle no acute issues DG femur /DG right knee : "Right femoral intramedullary nail with increased perihardware lucency about the proximal aspect of the femoral component concerning for loosening. Interval progression of the osseous remodeling about the chronic supracondylar fracture of the right femur.  Tricompartmental knee osteoarthritis." Seen by ortho , does not think hardware is an issue    Partial symptomatic epilepsy with complex partial seizures, not intractable, without status epilepticus Cont keppra  H/o CVA with Right spastic hemiparesis Cont statin  Hypertension Cont home BP meds once med rec is completed.  Insulin dependent type 2 Diabetes A1c 6.2 Home regimen: levemir 12units bid and metformin per chart review on ssi only currently   FTT from [facility], son does not want her mother to go back to [facility], transitional care consulted      I have Reviewed nursing notes, Vitals, pain scores, I/o's, Lab results and  imaging results since pt's last encounter, details please see discussion above  I ordered the following labs:  Unresulted Labs (From admission, onward)     Start     Ordered   10/16/22 0500  CBC with Differential/Platelet  Tomorrow morning,   R       Question:  Specimen collection method  Answer:  Lab=Lab collect   10/15/22 0859   10/16/22 0500  Basic metabolic panel  Daily,   R     Question:  Specimen collection method  Answer:  Lab=Lab  collect   10/15/22 0859   10/15/22 0858  Culture, blood (Routine X 2) w Reflex to ID Panel  BLOOD CULTURE X 2,   R (with TIMED occurrences)      10/15/22 0857             DVT prophylaxis:  apixaban (ELIQUIS) tablet 5 mg   Code Status:   Code Status: Full Code  Family Communication: son over the phone on 4/11 Disposition:   Status is: Observation    Dispo: The patient is from: [facility]               Anticipated d/c is to: son does not want her mother to go  back to [facility]              Anticipated d/c date is: TBD  Antimicrobials:   Anti-infectives (From admission, onward)    Start     Dose/Rate Route Frequency Ordered Stop   10/14/22 1615  nitrofurantoin (macrocrystal-monohydrate) (MACROBID) capsule 100 mg        100 mg Oral Every 12 hours 10/14/22 1525     10/14/22 1200  cefTRIAXone (ROCEPHIN) 1 g in sodium chloride 0.9 % 100 mL IVPB  Status:  Discontinued        1 g 200 mL/hr over 30 Minutes Intravenous  Once 10/14/22 1105 10/14/22 1112   10/14/22 1200  cefTRIAXone (ROCEPHIN) 1 g in sodium chloride 0.9 % 100 mL IVPB        1 g 200 mL/hr over 30 Minutes Intravenous Every 24 hours 10/14/22 1112     10/13/22 2030  cefTRIAXone (ROCEPHIN) 1 g in sodium chloride 0.9 % 100 mL IVPB        1 g 200 mL/hr over 30 Minutes Intravenous  Once 10/13/22 2026 10/13/22 2107           Objective: Vitals:   10/14/22 1310 10/14/22 1717 10/14/22 2032 10/15/22 0557  BP: (!) 137/96 (!) 125/94 (!) 144/94 (!) 148/98  Pulse: 94 92 (!) 103 92  Resp: Temp: 98.7 F (37.1 C) 98.7 F (37.1 C) 99.4 F (37.4 C) (!) 100.5 F (38.1 C)  TempSrc:   Oral   SpO2: 98% 97% 96% 92%  Weight:        Intake/Output Summary (Last 24 hours) at 10/15/2022 0900 Last data filed at 10/14/2022 2000 Gross per 24 hour  Intake 96.95 ml  Output 200 ml  Net -103.05 ml   Filed Weights   10/13/22 2314  Weight: 66.6 kg    Examination:  General exam: alert, awake, not oriented to time Respiratory system: Clear to auscultation. Respiratory effort normal. Cardiovascular system:  RRR.  Gastrointestinal system: Abdomen is nondistended, soft and nontender.  Normal bowel sounds heard. Central nervous system: Alert and oriented. Right hemiparesis, not able to move both legs  Extremities:  no edema Skin: No rashes, lesions or ulcers Psychiatry: no agitation currently     Data Reviewed: I have personally reviewed  labs and visualized  imaging studies since  the last encounter and formulate the plan        Scheduled Meds:  apixaban  5 mg Oral BID   insulin aspart  0-9 Units Subcutaneous Q4H   levETIRAcetam  500 mg Oral BID   nitrofurantoin (macrocrystal-monohydrate)  100 mg Oral Q12H   potassium chloride  40 mEq Oral Once   senna-docusate  1 tablet Oral BID   Continuous Infusions:  cefTRIAXone (ROCEPHIN)  IV Stopped (  10/14/22 1906)   magnesium sulfate bolus IVPB       LOS: 0 days   Time spent:  Albertine Grates, MD PhD FACP [facility]  Available via Epic secure chat 7am-7pm for nonurgent issues Please page for urgent issues To page the attending provider between 7A-7P or the covering provider during after hours 7P-7A, please log into the web site [email or web address] and access using universal [place] password for that web site. If you do not have the password, please call the hospital operator.    10/15/2022, 9:00 AM

## 2022-10-16 DIAGNOSIS — G9341 Metabolic encephalopathy: Secondary | ICD-10-CM | POA: Diagnosis not present

## 2022-10-16 LAB — CBC WITH DIFFERENTIAL/PLATELET
Abs Immature Granulocytes: 0.03 10*3/uL (ref 0.00–0.07)
Basophils Absolute: 0 10*3/uL (ref 0.0–0.1)
Basophils Relative: 1 %
Eosinophils Absolute: 0.6 10*3/uL — ABNORMAL HIGH (ref 0.0–0.5)
Eosinophils Relative: 7 %
HCT: 36.8 % (ref 36.0–46.0)
Hemoglobin: 11.7 g/dL — ABNORMAL LOW (ref 12.0–15.0)
Immature Granulocytes: 0 %
Lymphocytes Relative: 33 %
Lymphs Abs: 2.7 10*3/uL (ref 0.7–4.0)
MCH: 28.1 pg (ref 26.0–34.0)
MCHC: 31.8 g/dL (ref 30.0–36.0)
MCV: 88.5 fL (ref 80.0–100.0)
Monocytes Absolute: 0.7 10*3/uL (ref 0.1–1.0)
Monocytes Relative: 8 %
Neutro Abs: 4.1 10*3/uL (ref 1.7–7.7)
Neutrophils Relative %: 51 %
Platelets: 134 10*3/uL — ABNORMAL LOW (ref 150–400)
RBC: 4.16 MIL/uL (ref 3.87–5.11)
RDW: 13.2 % (ref 11.5–15.5)
WBC: 8 10*3/uL (ref 4.0–10.5)
nRBC: 0 % (ref 0.0–0.2)

## 2022-10-16 LAB — BASIC METABOLIC PANEL
Anion gap: 14 (ref 5–15)
BUN: 7 mg/dL — ABNORMAL LOW (ref 8–23)
CO2: 24 mmol/L (ref 22–32)
Calcium: 8.3 mg/dL — ABNORMAL LOW (ref 8.9–10.3)
Chloride: 102 mmol/L (ref 98–111)
Creatinine, Ser: 0.92 mg/dL (ref 0.44–1.00)
GFR, Estimated: >60 mL/min (ref >60)
Glucose, Bld: 138 mg/dL — ABNORMAL HIGH (ref 70–99)
Potassium: 4 mmol/L (ref 3.5–5.1)
Sodium: 140 mmol/L (ref 135–145)

## 2022-10-16 LAB — GLUCOSE, CAPILLARY
Glucose-Capillary: 153 mg/dL — ABNORMAL HIGH (ref 70–99)
Glucose-Capillary: 162 mg/dL — ABNORMAL HIGH (ref 70–99)
Glucose-Capillary: 197 mg/dL — ABNORMAL HIGH (ref 70–99)
Glucose-Capillary: 85 mg/dL (ref 70–99)
Glucose-Capillary: 99 mg/dL (ref 70–99)
Glucose-Capillary: 99 mg/dL (ref 70–99)

## 2022-10-16 LAB — URINE CULTURE: Culture: >=100000 — AB

## 2022-10-16 LAB — MAGNESIUM: Magnesium: 2.1 mg/dL (ref 1.7–2.4)

## 2022-10-16 NOTE — Progress Notes (Signed)
PROGRESS NOTE    [name]  NVB:[id number] DOB: [birth year] DOA: 10/13/2022 PCP: Corwin Levins, MD     Brief Narrative:   [name] reports pt is coming from [facility].   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  Distal R femur fx managed non-operatively in 2021 (pt non ambulatory due to stroke).   Pt with increased R knee pain and confusion since a bed transfer a couple of days ago per son.  Decreased PO intake.   No report of fall.    Subjective:   Fever resolved, urine less cloudy, she is alert and awake, no agitation She did not complain of pain today , she states she is at nursing home currently ,not a reliable historian  Assessment & Plan:  Principal Problem:   Acute metabolic encephalopathy Active Problems:   UTI (urinary tract infection)   Right knee pain   Diabetes   Hypertension   History of stroke   Right spastic hemiparesis   Partial symptomatic epilepsy with complex partial seizures, not intractable, without status epilepticus   Closed fracture of right distal femur    Assessment and Plan:   Acute metabolic encephalopathy/UTI -Likely hypoactive delirium + recrudescence of stroke symptoms in setting of possible UTI  -son reports she has decreased responsiveness, son states when his mother gets confused with decreased responsiveness from uti in the past -blood culture no growth so far, fever resolved - urine culture + klebsiella pneumoniae, per past culture result she has ESBL, will changed abx from rocephin  to zosyn  ( avoid cipro and imipenem due to h/o seizure)    Hypokalemia/hypomagnesemia, replace , normalized, monitor   Right leg  pain ? Exam not reliable H/o right hemiparesis , was lifted out of bed several days ago, son reports she  started to c/o pain on right side  DG right ankle no acute issues DG femur /DG right knee : "Right femoral intramedullary nail with increased perihardware lucency about the proximal aspect of the femoral component concerning for loosening. Interval progression of the osseous remodeling about the chronic supracondylar fracture of the right femur.  Tricompartmental knee osteoarthritis." Seen by ortho , does not think hardware is an issue Does not appear in pain today    Partial symptomatic epilepsy with complex partial seizures, not intractable, without status epilepticus Cont keppra  H/o CVA with Right spastic hemiparesis Cont statin  Hypertension Cont home BP meds once med rec is completed.  Insulin dependent type 2 Diabetes A1c 6.2 Home regimen: levemir 12units bid and metformin per chart review on ssi only currently   FTT from [facility], son does not want her [facility], transitional care consulted      I have Reviewed nursing notes, Vitals, pain scores, I/o's, Lab results and  imaging results since pt's last encounter, details please see discussion above  I ordered the following labs:  Unresulted Labs (From admission, onward)     Start     Ordered   10/16/22 0500  Basic metabolic panel  Daily,   R     Question:  Specimen collection method  Answer:  Lab=Lab collect   10/15/22 0859             DVT prophylaxis:  apixaban (  ELIQUIS) tablet 5 mg   Code Status:   Code Status: Full Code  Family Communication: son over the phone on 4/11 Disposition:    Dispo: The patient is from: [facility]               Anticipated d/c is to: son does not want her mother to go back to [facility]              Anticipated d/c date is: Likely discharge to a different SNF on Monday  Antimicrobials:   Anti-infectives (From admission, onward)    Start     Dose/Rate Route Frequency Ordered Stop   10/15/22 1515  piperacillin-tazobactam (ZOSYN) IVPB  3.375 g        3.375 g 12.5 mL/hr over 240 Minutes Intravenous Every 8 hours 10/15/22 1415     10/14/22 1615  nitrofurantoin (macrocrystal-monohydrate) (MACROBID) capsule 100 mg  Status:  Discontinued        100 mg Oral Every 12 hours 10/14/22 1525 10/15/22 1400   10/14/22 1200  cefTRIAXone (ROCEPHIN) 1 g in sodium chloride 0.9 % 100 mL IVPB  Status:  Discontinued        1 g 200 mL/hr over 30 Minutes Intravenous  Once 10/14/22 1105 10/14/22 1112   10/14/22 1200  cefTRIAXone (ROCEPHIN) 1 g in sodium chloride 0.9 % 100 mL IVPB  Status:  Discontinued        1 g 200 mL/hr over 30 Minutes Intravenous Every 24 hours 10/14/22 1112 10/15/22 1400   10/13/22 2030  cefTRIAXone (ROCEPHIN) 1 g in sodium chloride 0.9 % 100 mL IVPB        1 g 200 mL/hr over 30 Minutes Intravenous  Once 10/13/22 2026 10/13/22 2107           Objective: Vitals:   10/15/22 2058 10/16/22 0519 10/16/22 0826 10/16/22 1710  BP: 137/82 134/89 (!) 124/96 (!) 142/86  Pulse: 89 92 94 98  Resp: Temp: 97.7 F (36.5 C) 98.3 F (36.8 C) (!) 97.4 F (36.3 C) 98.7 F (37.1 C)  TempSrc: Oral  Oral Oral  SpO2: 97% 96% 97% 96%  Weight:        Intake/Output Summary (Last 24 hours) at 10/16/2022 1719 Last data filed at 10/16/2022 1511 Gross per 24 hour  Intake 400 ml  Output 1600 ml  Net -1200 ml   Filed Weights   10/13/22 2314  Weight: 66.6 kg    Examination:  General exam: alert, awake, not oriented to time Respiratory system: Clear to auscultation. Respiratory effort normal. Cardiovascular system:  RRR.  Gastrointestinal system: Abdomen is nondistended, soft and nontender.  Normal bowel sounds heard. Central nervous system: Alert and oriented. Right hemiparesis, not able to move both legs  Extremities:  no edema Skin: No rashes, lesions or ulcers Psychiatry: no agitation currently     Data Reviewed: I have personally reviewed  labs and visualized  imaging studies since the last encounter and  formulate the plan        Scheduled Meds:  apixaban  5 mg Oral BID   gabapentin  100 mg Oral QHS   insulin aspart  0-9 Units Subcutaneous Q4H   levETIRAcetam  500 mg Oral BID   senna-docusate  1 tablet Oral BID   Continuous Infusions:  piperacillin-tazobactam (ZOSYN)  IV 3.375 g (10/16/22 1515)     LOS: 1 day   Time spent:  Albertine Grates, MD PhD FACP [facility]  Available  via Epic secure chat 7am-7pm for nonurgent issues Please page for urgent issues To page the attending provider between 7A-7P or the covering provider during after hours 7P-7A, please log into the web site [email or web address] and access using universal [place] password for that web site. If you do not have the password, please call the hospital operator.    10/16/2022, 5:19 PM

## 2022-10-17 DIAGNOSIS — G9341 Metabolic encephalopathy: Secondary | ICD-10-CM | POA: Diagnosis not present

## 2022-10-17 LAB — GLUCOSE, CAPILLARY
Glucose-Capillary: 133 mg/dL — ABNORMAL HIGH (ref 70–99)
Glucose-Capillary: 134 mg/dL — ABNORMAL HIGH (ref 70–99)
Glucose-Capillary: 142 mg/dL — ABNORMAL HIGH (ref 70–99)
Glucose-Capillary: 143 mg/dL — ABNORMAL HIGH (ref 70–99)
Glucose-Capillary: 327 mg/dL — ABNORMAL HIGH (ref 70–99)
Glucose-Capillary: 332 mg/dL — ABNORMAL HIGH (ref 70–99)

## 2022-10-17 LAB — CBC WITH DIFFERENTIAL/PLATELET
Abs Immature Granulocytes: 0.03 10*3/uL (ref 0.00–0.07)
Basophils Absolute: 0 10*3/uL (ref 0.0–0.1)
Basophils Relative: 0 %
Eosinophils Absolute: 0.6 10*3/uL — ABNORMAL HIGH (ref 0.0–0.5)
Eosinophils Relative: 7 %
HCT: 37 % (ref 36.0–46.0)
Hemoglobin: 11.7 g/dL — ABNORMAL LOW (ref 12.0–15.0)
Immature Granulocytes: 0 %
Lymphocytes Relative: 35 %
Lymphs Abs: 3.3 10*3/uL (ref 0.7–4.0)
MCH: 28.1 pg (ref 26.0–34.0)
MCHC: 31.6 g/dL (ref 30.0–36.0)
MCV: 88.9 fL (ref 80.0–100.0)
Monocytes Absolute: 1 10*3/uL (ref 0.1–1.0)
Monocytes Relative: 11 %
Neutro Abs: 4.4 10*3/uL (ref 1.7–7.7)
Neutrophils Relative %: 47 %
Platelets: 161 10*3/uL (ref 150–400)
RBC: 4.16 MIL/uL (ref 3.87–5.11)
RDW: 13.3 % (ref 11.5–15.5)
WBC: 9.3 10*3/uL (ref 4.0–10.5)
nRBC: 0 % (ref 0.0–0.2)

## 2022-10-17 LAB — BASIC METABOLIC PANEL
Anion gap: 8 (ref 5–15)
BUN: 6 mg/dL — ABNORMAL LOW (ref 8–23)
CO2: 24 mmol/L (ref 22–32)
Calcium: 8.7 mg/dL — ABNORMAL LOW (ref 8.9–10.3)
Chloride: 107 mmol/L (ref 98–111)
Creatinine, Ser: 0.83 mg/dL (ref 0.44–1.00)
GFR, Estimated: >60 mL/min (ref >60)
Glucose, Bld: 140 mg/dL — ABNORMAL HIGH (ref 70–99)
Potassium: 4 mmol/L (ref 3.5–5.1)
Sodium: 139 mmol/L (ref 135–145)

## 2022-10-17 LAB — PHOSPHORUS: Phosphorus: 3.1 mg/dL (ref 2.5–4.6)

## 2022-10-17 MED ORDER — SACCHAROMYCES BOULARDII 250 MG PO CAPS
250.0000 mg | ORAL_CAPSULE | Freq: Two times a day (BID) | ORAL | Status: DC
  Administered 2022-10-17 – 2022-10-21 (×9): 250 mg via ORAL
  Filled 2022-10-17 (×9): qty 1

## 2022-10-17 MED ORDER — FUROSEMIDE 10 MG/ML IJ SOLN
40.0000 mg | Freq: Once | INTRAMUSCULAR | Status: AC
  Administered 2022-10-17: 40 mg via INTRAVENOUS
  Filled 2022-10-17: qty 4

## 2022-10-17 MED ORDER — SODIUM CHLORIDE 0.9 % IV SOLN
3.0000 g | Freq: Four times a day (QID) | INTRAVENOUS | Status: AC
  Administered 2022-10-17 – 2022-10-19 (×11): 3 g via INTRAVENOUS
  Filled 2022-10-17 (×11): qty 8

## 2022-10-17 MED ORDER — POLYETHYLENE GLYCOL 3350 17 G PO PACK
17.0000 g | PACK | Freq: Every day | ORAL | Status: AC
  Administered 2022-10-17 – 2022-10-18 (×2): 17 g via ORAL
  Filled 2022-10-17 (×2): qty 1

## 2022-10-17 MED ORDER — SENNOSIDES-DOCUSATE SODIUM 8.6-50 MG PO TABS
1.0000 | ORAL_TABLET | Freq: Two times a day (BID) | ORAL | Status: AC
  Administered 2022-10-17 – 2022-10-18 (×4): 1 via ORAL
  Filled 2022-10-17 (×4): qty 1

## 2022-10-17 NOTE — Progress Notes (Addendum)
PROGRESS NOTE    [name]  MPN:[id number] DOB: 05-30-1946 DOA: 10/13/2022 PCP: Corwin Levins, MD     Brief Narrative:   [name] reports pt is coming from [facility].   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  Distal R femur fx managed non-operatively in 2021 (pt non ambulatory due to stroke).   Pt with increased R knee pain and confusion since a bed transfer a couple of days ago per son.  Decreased PO intake.   No report of fall.    Subjective:  Last fever on 4/12, no fever since, urine is less cloudy, she is alert and awake, no agitation Son at bedside    Assessment & Plan:  Principal Problem:   Acute metabolic encephalopathy Active Problems:   UTI (urinary tract infection)   Right knee pain   Diabetes   Hypertension   History of stroke   Right spastic hemiparesis   Partial symptomatic epilepsy with complex partial seizures, not intractable, without status epilepticus   Closed fracture of right distal femur    Assessment and Plan:   Acute metabolic encephalopathy/UTI -Likely hypoactive delirium + recrudescence of stroke symptoms in setting of possible UTI  -son reports she has decreased responsiveness, son states when his mother gets confused with decreased responsiveness from uti in the past -blood culture no growth so far, fever resolved - urine culture + greater than 100,000 colonies of klebsiella pneumoniae, 20,000 colonies of Enterococcus.  faecalis,  discussed with ID over the phone, change zosyn to Unasyn, with a plan to discharge on Augmentin to finish antibiotic treatment course , last day abx on 4/16( avoid cipro and imipenem due to h/o seizure)    Hypokalemia/hypomagnesemia, replace , normalized, monitor   Right leg  pain ? Exam not  reliable -H/o right hemiparesis , was lifted out of bed several days ago, son reports she started to c/o pain on right side  -DG right ankle no acute issues -DG femur /DG right knee : "Right femoral intramedullary nail with increased perihardware lucency about the proximal aspect of the femoral component concerning for loosening. Interval progression of the osseous remodeling about the chronic supracondylar fracture of the right femur.  Tricompartmental knee osteoarthritis." -Seen by ortho , does not think hardware is an issue -Does not appear in pain today, son reports leg spasm/muscle fasciculation on the right has improved/resolved  Right posterior leg wound, does not appear infected, wound care consulted  Bilateral lower extremity edema, son reports is chronic  likely dependent edema due to prior CVA/bedbound status, albumin 3,  already on eliquis, son prefers to have venous doppler done , order placed Trial dose of iv lasixx1 on 4/14  Partial symptomatic epilepsy with complex partial seizures, not intractable, without status epilepticus Cont keppra  H/o CVA with Right spastic hemiparesis Cont statin, eliquis  Hypertension Cont home BP meds once med rec is completed.  Insulin dependent type 2 Diabetes A1c 6.2 Home regimen: levemir 12units bid and metformin per chart review on ssi only currently , am fasting blood glucose 140  FTT from [facility], son does not want her mother to go back to [facility], transitional care consulted      I have Reviewed nursing notes, Vitals, pain scores, I/o's, Lab results and  imaging results since pt's last encounter, details please see discussion above  I ordered the following labs:  Unresulted Labs (From admission, onward)     Start     Ordered   10/16/22 0500  Basic metabolic panel  Daily,   R     Question:  Specimen collection method  Answer:  Lab=Lab collect   10/15/22 0859             DVT prophylaxis:  apixaban  (ELIQUIS) tablet 5 mg   Code Status:   Code Status: Full Code  Family Communication: son over the phone on 4/11 , at bedside on 4/14 Disposition:    Dispo: The patient is from: [facility]               Anticipated d/c is to: son does not want her mother to go back to [facility]              Anticipated d/c date is: Likely discharge to a different SNF on Monday  Antimicrobials:   Anti-infectives (From admission, onward)    Start     Dose/Rate Route Frequency Ordered Stop   10/15/22 1515  piperacillin-tazobactam (ZOSYN) IVPB 3.375 g        3.375 g 12.5 mL/hr over 240 Minutes Intravenous Every 8 hours 10/15/22 1415     10/14/22 1615  nitrofurantoin (macrocrystal-monohydrate) (MACROBID) capsule 100 mg  Status:  Discontinued        100 mg Oral Every 12 hours 10/14/22 1525 10/15/22 1400   10/14/22 1200  cefTRIAXone (ROCEPHIN) 1 g in sodium chloride 0.9 % 100 mL IVPB  Status:  Discontinued        1 g 200 mL/hr over 30 Minutes Intravenous  Once 10/14/22 1105 10/14/22 1112   10/14/22 1200  cefTRIAXone (ROCEPHIN) 1 g in sodium chloride 0.9 % 100 mL IVPB  Status:  Discontinued        1 g 200 mL/hr over 30 Minutes Intravenous Every 24 hours 10/14/22 1112 10/15/22 1400   10/13/22 2030  cefTRIAXone (ROCEPHIN) 1 g in sodium chloride 0.9 % 100 mL IVPB        1 g 200 mL/hr over 30 Minutes Intravenous  Once 10/13/22 2026 10/13/22 2107           Objective: Vitals:   10/16/22 1710 10/16/22 1956 10/17/22 0436 10/17/22 0801  BP: (!) 142/86 121/82 122/75 112/84  Pulse: 98 (!) 110 93 93  Resp: Temp: 98.7 F (37.1 C) 98.6 F (37 C) 98.2 F (36.8 C) 98 F (36.7 C)  TempSrc: Oral Oral  Oral  SpO2: 96% 97% 97% 100%  Weight:        Intake/Output Summary (Last 24 hours) at 10/17/2022 0914 Last data filed at 10/17/2022 0447 Gross per 24 hour  Intake 300 ml  Output 1550 ml  Net -1250 ml   Filed Weights   10/13/22 2314  Weight: 66.6 kg    Examination:  General exam:  alert, awake, not oriented to time Respiratory system: Clear to auscultation. Respiratory effort normal. Cardiovascular system:  RRR.  Gastrointestinal system: Abdomen is nondistended, soft and nontender.  Normal bowel sounds heard. Central nervous system: Alert and oriented. Right hemiparesis, not able to move both legs  Extremities:  bilateral lower extremity edema, posterior right leg wound, does not look infection with scan drainage on foam pad  Skin: No rashes, lesions or ulcers Psychiatry: no agitation currently        Data  Reviewed: I have personally reviewed  labs and visualized  imaging studies since the last encounter and formulate the plan        Scheduled Meds:  apixaban  5 mg Oral BID   gabapentin  100 mg Oral QHS   insulin aspart  0-9 Units Subcutaneous Q4H   levETIRAcetam  500 mg Oral BID   polyethylene glycol  17 g Oral Daily   saccharomyces boulardii  250 mg Oral BID   senna-docusate  1 tablet Oral BID   Continuous Infusions:  piperacillin-tazobactam (ZOSYN)  IV 3.375 g (10/17/22 0501)     LOS: 2 days   Time spent:  Albertine Grates, MD PhD FACP [facility]  Available via Epic secure chat 7am-7pm for nonurgent issues Please page for urgent issues To page the attending provider between 7A-7P or the covering provider during after hours 7P-7A, please log into the web site [email or web address] and access using universal [place] password for that web site. If you do not have the password, please call the hospital operator.    10/17/2022, 9:14 AM

## 2022-10-17 NOTE — Progress Notes (Signed)
Pharmacy Antibiotic Note  [name] is a 77 y.o. female admitted on 10/13/2022 with encephalopathy. Ucx growing E faecalis and klebsiella pneumoniae. Pharmacy has been consulted for Unasyn dosing.  Plan: Start Unasyn 3g IV Q6h  F/u clinical progression and further infectious w/u  Weight: 66.6 kg (146 lb 13.2 oz)  Temp (24hrs), Avg:98.4 F (36.9 C), Min:98 F (36.7 C), Max:98.7 F (37.1 C)  Recent Labs  Lab 10/13/22 1504 10/14/22 0437 10/15/22 0522 10/16/22 0254 10/17/22 0508  WBC 10.9* 9.2 9.1 8.0 9.3  CREATININE 0.94 0.69 0.75 0.92 0.83     CrCl cannot be calculated (Unknown ideal weight.).    Allergies  Allergen Reactions   Pork-Derived Products Other (See Comments)    To keep blood pressure down    Antimicrobials this admission: Ceftriaxone 4/10 >> 4/12 Nitrofurantoin 4/10 >> 4/11 Zosyn 4/12 >> 4/14 Unasyn 4/14 >>  Dose adjustments this admission: None   Microbiology results: 4/12 BCx: ip 4/10 UCx: >100K Kleb pneumo, 20K Efaecalis   Thank you for allowing pharmacy to be a part of this patient's care.  Rennis Petty, PharmD PGY1 Pharmacy Resident 10/17/2022 9:57 AM

## 2022-10-18 ENCOUNTER — Inpatient Hospital Stay (HOSPITAL_COMMUNITY): Payer: Medicare PPO

## 2022-10-18 DIAGNOSIS — R609 Edema, unspecified: Secondary | ICD-10-CM | POA: Diagnosis not present

## 2022-10-18 DIAGNOSIS — M79609 Pain in unspecified limb: Secondary | ICD-10-CM

## 2022-10-18 DIAGNOSIS — G9341 Metabolic encephalopathy: Secondary | ICD-10-CM | POA: Diagnosis not present

## 2022-10-18 DIAGNOSIS — E876 Hypokalemia: Secondary | ICD-10-CM

## 2022-10-18 DIAGNOSIS — E1159 Type 2 diabetes mellitus with other circulatory complications: Secondary | ICD-10-CM | POA: Diagnosis not present

## 2022-10-18 DIAGNOSIS — N3000 Acute cystitis without hematuria: Secondary | ICD-10-CM | POA: Diagnosis not present

## 2022-10-18 DIAGNOSIS — G40209 Localization-related (focal) (partial) symptomatic epilepsy and epileptic syndromes with complex partial seizures, not intractable, without status epilepticus: Secondary | ICD-10-CM | POA: Diagnosis not present

## 2022-10-18 LAB — GLUCOSE, CAPILLARY
Glucose-Capillary: 73 mg/dL (ref 70–99)
Glucose-Capillary: 90 mg/dL (ref 70–99)
Glucose-Capillary: 196 mg/dL — ABNORMAL HIGH (ref 70–99)
Glucose-Capillary: 215 mg/dL — ABNORMAL HIGH (ref 70–99)
Glucose-Capillary: 102 mg/dL — ABNORMAL HIGH (ref 70–99)
Glucose-Capillary: 95 mg/dL (ref 70–99)

## 2022-10-18 LAB — BASIC METABOLIC PANEL
Anion gap: 10 (ref 5–15)
BUN: 6 mg/dL — ABNORMAL LOW (ref 8–23)
CO2: 24 mmol/L (ref 22–32)
Calcium: 8.8 mg/dL — ABNORMAL LOW (ref 8.9–10.3)
Chloride: 105 mmol/L (ref 98–111)
Creatinine, Ser: 0.74 mg/dL (ref 0.44–1.00)
GFR, Estimated: >60 mL/min (ref >60)
Glucose, Bld: 98 mg/dL (ref 70–99)
Potassium: 4.2 mmol/L (ref 3.5–5.1)
Sodium: 139 mmol/L (ref 135–145)

## 2022-10-18 LAB — MAGNESIUM: Magnesium: 2 mg/dL (ref 1.7–2.4)

## 2022-10-18 MED ORDER — IBUPROFEN 200 MG PO TABS
200.0000 mg | ORAL_TABLET | Freq: Three times a day (TID) | ORAL | Status: AC
  Administered 2022-10-18 – 2022-10-20 (×9): 200 mg via ORAL
  Filled 2022-10-18 (×9): qty 1

## 2022-10-18 MED ORDER — DICLOFENAC SODIUM 1 % EX GEL
4.0000 g | Freq: Four times a day (QID) | CUTANEOUS | Status: DC
  Administered 2022-10-18 – 2022-10-20 (×10): 4 g via TOPICAL
  Filled 2022-10-18: qty 100

## 2022-10-18 MED ORDER — ACETAMINOPHEN 325 MG PO TABS
650.0000 mg | ORAL_TABLET | Freq: Four times a day (QID) | ORAL | Status: DC
  Administered 2022-10-18 – 2022-10-21 (×11): 650 mg via ORAL
  Filled 2022-10-18 (×11): qty 2

## 2022-10-18 MED ORDER — OXYCODONE HCL 5 MG PO TABS
5.0000 mg | ORAL_TABLET | ORAL | Status: DC | PRN
  Administered 2022-10-21: 5 mg via ORAL
  Filled 2022-10-18: qty 1

## 2022-10-18 NOTE — Plan of Care (Signed)
Patient AOX2, disoriented to time and situation.  VSS throughout shift.  All meds given on time as ordered.  Pt denied pain and SOB.  Diminished lungs, IS encouraged.  Purewick in place.  POC maintained, will continue to monitor.  Problem: Education: Goal: Ability to describe self-care measures that may prevent or decrease complications (Diabetes Survival Skills Education) will improve Outcome: Progressing Goal: Individualized Educational Video(s) Outcome: Progressing   Problem: Coping: Goal: Ability to adjust to condition or change in health will improve Outcome: Progressing   Problem: Fluid Volume: Goal: Ability to maintain a balanced intake and output will improve Outcome: Progressing   Problem: Health Behavior/Discharge Planning: Goal: Ability to identify and utilize available resources and services will improve Outcome: Progressing Goal: Ability to manage health-related needs will improve Outcome: Progressing   Problem: Metabolic: Goal: Ability to maintain appropriate glucose levels will improve Outcome: Progressing   Problem: Nutritional: Goal: Maintenance of adequate nutrition will improve Outcome: Progressing Goal: Progress toward achieving an optimal weight will improve Outcome: Progressing   Problem: Skin Integrity: Goal: Risk for impaired skin integrity will decrease Outcome: Progressing   Problem: Tissue Perfusion: Goal: Adequacy of tissue perfusion will improve Outcome: Progressing   Problem: Education: Goal: Knowledge of General Education information will improve Description: Including pain rating scale, medication(s)/side effects and non-pharmacologic comfort measures Outcome: Progressing   Problem: Health Behavior/Discharge Planning: Goal: Ability to manage health-related needs will improve Outcome: Progressing   Problem: Clinical Measurements: Goal: Ability to maintain clinical measurements within normal limits will improve Outcome:  Progressing Goal: Will remain free from infection Outcome: Progressing Goal: Diagnostic test results will improve Outcome: Progressing Goal: Respiratory complications will improve Outcome: Progressing Goal: Cardiovascular complication will be avoided Outcome: Progressing   Problem: Activity: Goal: Risk for activity intolerance will decrease Outcome: Progressing   Problem: Nutrition: Goal: Adequate nutrition will be maintained Outcome: Progressing   Problem: Coping: Goal: Level of anxiety will decrease Outcome: Progressing   Problem: Elimination: Goal: Will not experience complications related to bowel motility Outcome: Progressing Goal: Will not experience complications related to urinary retention Outcome: Progressing   Problem: Pain Managment: Goal: General experience of comfort will improve Outcome: Progressing   Problem: Safety: Goal: Ability to remain free from injury will improve Outcome: Progressing   Problem: Skin Integrity: Goal: Risk for impaired skin integrity will decrease Outcome: Progressing   

## 2022-10-18 NOTE — Assessment & Plan Note (Signed)
Hypomagnesemia.  Renal function stable and electrolytes have been corrected.

## 2022-10-18 NOTE — Progress Notes (Signed)
BLE venous duplex has been completed.   Results can be found under chart review under CV PROC. 10/18/2022 12:01 PM Raymond Azure RVT, RDMS

## 2022-10-18 NOTE — Progress Notes (Addendum)
Pharmacy Antibiotic Note  [name] is a 77 y.o. female admitted on 10/13/2022 with encephalopathy. Ucx growing E faecalis and klebsiella pneumoniae. Pharmacy has been consulted for Unasyn dosing. Reducing from q6h to q8h. Assuming patient CrCl </= to 30 when factoring in her age, poor functional status and non ambulatory state.    Plan: Adjust Unasyn 3g IV to Q8h  F/u clinical progression and further infectious w/u  Weight: 66.6 kg (146 lb 13.2 oz)  Temp (24hrs), Avg:97.9 F (36.6 C), Min:97.5 F (36.4 C), Max:98.3 F (36.8 C)  Recent Labs  Lab 10/13/22 1504 10/14/22 0437 10/15/22 0522 10/16/22 0254 10/17/22 0508 10/18/22 0341  WBC 10.9* 9.2 9.1 8.0 9.3  --   CREATININE 0.94 0.69 0.75 0.92 0.83 0.74     CrCl cannot be calculated (Unknown ideal weight.).    Allergies  Allergen Reactions   Pork-Derived Products Other (See Comments)    To keep blood pressure down    Antimicrobials this admission: Ceftriaxone 4/10 >> 4/12 Nitrofurantoin 4/10 >> 4/11 Zosyn 4/12 >> 4/14 Unasyn 4/14 >>  Dose adjustments this admission: 4/15   Microbiology results: 4/12 BCx: ip 4/10 UCx: >100K Kleb pneumo, 20K Efaecalis    Thank you for allowing [place] to participate in this patients care. Signe Colt, PharmD 10/18/2022 2:53 PM  **Pharmacist phone directory can be found on [email or web address] listed under [facility]**

## 2022-10-18 NOTE — Consult Note (Addendum)
WOC Nurse Consult Note: Reason for Consult: Consult requested for right leg wound.  Performed remotely after review of progress notes and photos in the EMR.  Wound type: Right posterior calf with full thickness wound, red-yellow and moist. Dressing procedure/placement/frequency: Topical treatment orders provided for bedside nurses to perform as follows to promote drying and healing: Apply Xeroform gauze to right posterior leg Q day, then cover with foam dressing.  (Change foam dressing Q 3 days or PRN soiling.) Please re-consult if further assistance is needed.  Thank-you,  Cammie Mcgee MSN, RN, CWOCN, [place], CNS [phone]

## 2022-10-18 NOTE — Progress Notes (Addendum)
Progress Note   Patient: [name] JXB:[id number] DOB: 11-08-1945 DOA: 10/13/2022     3 DOS: the patient was seen and examined on 10/18/2022   Brief hospital course: [name] was admitted to the hospital with the working diagnosis of acute metabolic encephalopathy.   77 yo female with the past medical history of T2DM, hypertension, dyslipidemia and CVA with right sided hemiparesis who presented with right knee pain. She had a right femur fracture since 2021, that has been managed non operative due to poor functional status and non ambulatory state. She was noted to have worsening right lower extremity pain and poor oral intake that prompted her transfer from SNF to the ED. On her initial physical examination her blood pressure was 110/77, HR 83, RR 16 and 02 saturation 97%, lungs with no wheezing or rales, heart with S1 and S2 present and rhythmic, abdomen with no distention, right knee was tender to palpation. Positive right sided hemiparesis.   Na 143, K 3,8 Cl 107 bicarbonate 23, glucose 94 bun 9 cr 0,94  Wbc 10,9 hgb 13.2 plt 163  Urine analysis SG 1,011, 30 protein, large leukocytes, moderate Hgb, >50 wbc, 6-10 rbc. Rare bacteria.   Chest radiograph hypo-inflated, with mild cardiomegaly, with fluid in the right fissure. No infiltrates.  Patient was placed on IV fluids and IV antibiotic therapy for urine infection.   Assessment and Plan: * Acute metabolic encephalopathy Likely multifactorial including urinary tract infection and uncontrolled right knee pain.  Clinically resolving.   UTI (urinary tract infection) Present on admission, with no sepsis.  Urine cultures positive for klebsiella pneumonia and enterococcus, resistant to ampicillin.   Plan to continue antibiotic therapy with Unasyn Iv and transition to oral Augmentin.  Continue close monitoring cell count and temperature curve.    Right knee pain Persistent right knee pain.  Right posterior calf with full  thickness wound, present on admission.   Right femur and knee radiograph Femoral intramedullary nail with increased peri-hardware lucency about the proximal aspect of the femoral component concerning for loosening.  Interval progression of the osseous remodeling about the chronic supracondylar fracture of the right femur. Tricompartmental knee ostearthritis.   Pain not well controlled, plan to add ibuprofen 200 tid for 3 days, schedule acetaminophen q 6 hrs Add topical diclofenac and oral oxycodone.  Continue with local wound care.  Follow up US lower extremities.   Type 2 diabetes mellitus Fasting glucose is 98,  Continue insulin sliding scale for glucose cover and monitoring.   Hypertension Holding blood pressure medications to prevent hypotension.  Heart failure with diastolic dysfunction, with no exacerbation.  Coronary artery disease.    History of stroke Continue close blood pressure monitoring. PT and OT.  Chronic right sided hemiparesis with no ambulatory state.   PFO on apixaban.   Partial symptomatic epilepsy with complex partial seizures, not intractable, without status epilepticus Cont keppra  Hypokalemia Hypomagnesemia.  Renal function stable and electrolytes have been corrected.         Subjective: Patient with no chest pain or dyspnea, continue with right leg pain from the knee down, moderate to severe in intensity and has been constant, only mild improvement with oral analgesics.   Physical Exam: Vitals:   10/17/22 1646 10/17/22 2004 10/18/22 0455 10/18/22 0737  BP: (!) 147/95 (!) 133/92 108/72 122/83  Pulse: (!) 102 (!) 107 96 93  Resp: Temp: 97.6 F (36.4 C) 98.3 F (36.8 C) (!) 97.5 F (36.4  C) 98 F (36.7 C)  TempSrc:  Oral Oral Oral  SpO2: 100% 98% 100% 99%  Weight:       Neurology awake and alert ENT with mild pallor Cardiovascular with S1 and S2 present and rhythmic Respiratory with no rales or wheezing Abdomen with no  distention  Positive bilateral lower extremity edema more right than left. Right knee tender to palpation, positive hypertrophy with no erythema or increased local temperature.  Data Reviewed:    Family Communication: no family at the bedside   Disposition: Status is: Inpatient Remains inpatient appropriate because: pain control   Planned Discharge Destination: Skilled nursing facility     Author: Coralie Keens, MD 10/18/2022 10:54 AM  For on call review [email or web address].

## 2022-10-18 NOTE — TOC Progression Note (Addendum)
Transition of Care [facility]) - Progression Note    Patient Details  Name: [name] MRN: [id number] Date of Birth: 08/16/1945  [facility]) CM/SW Contact  Delilah Shan, LCSWA Phone Number: 10/18/2022, 1:52 PM  Clinical Narrative:     CSW LVM for patients son Aurther Loft. CSW awaiting callback to discuss SNF bed offers. CSW will continue to follow and assist with patients dc planning needs.  Update-CSW received call back from patients son Aurther Loft. CSW provided patients son with LTC bed offers. Patients son informed CSW he is going to go view [facility] in [place] and will provide CSW choice for patient in the morning. Patients son informed CSW as of right now his first choice is [facility] (wants to go view to confirm) and 2nd choice is [facility]. CSW spoke with Gavin Pound with [facility] who confirmed they did offer LTC bed for patient. Stanton Kidney confirmed if patients son decides to pick [facility] that they will be able to accept patient tomorrow if medically ready.CSW will continue to follow and assist with patients dc planning needs.     Expected Discharge Plan: Skilled Nursing Facility Barriers to Discharge: Continued Medical Work up  Expected Discharge Plan and Services       Living arrangements for the past 2 months: Skilled Nursing Facility                                       Social Determinants of Health (SDOH) Interventions SDOH Screenings   Food Insecurity: No Food Insecurity (10/13/2022)  Housing: Low Risk  (10/13/2022)  Transportation Needs: No Transportation Needs (10/13/2022)  Utilities: Not At Risk (10/13/2022)  Depression (PHQ2-9): Low Risk  (02/10/2022)  Tobacco Use: Medium Risk (03/28/2022)    Readmission Risk Interventions     No data to display

## 2022-10-18 NOTE — Hospital Course (Signed)
Mrs. [name] was admitted to the hospital with the working diagnosis of acute metabolic encephalopathy.   77 yo female with the past medical history of T2DM, hypertension, dyslipidemia and CVA with right sided hemiparesis who presented with right knee pain. She had a right femur fracture since 2021, that has been managed non operative due to poor functional status and non ambulatory state. She was noted to have worsening right lower extremity pain and poor oral intake that prompted her transfer from SNF to the ED. On her initial physical examination her blood pressure was 110/77, HR 83, RR 16 and 02 saturation 97%, lungs with no wheezing or rales, heart with S1 and S2 present and rhythmic, abdomen with no distention, right knee was tender to palpation. Positive right sided hemiparesis.   Na 143, K 3,8 Cl 107 bicarbonate 23, glucose 94 bun 9 cr 0,94  Wbc 10,9 hgb 13.2 plt 163  Urine analysis SG 1,011, 30 protein, large leukocytes, moderate Hgb, >50 wbc, 6-10 rbc. Rare bacteria.   Chest radiograph hypo-inflated, with mild cardiomegaly, with fluid in the right fissure. No infiltrates.  Patient was placed on IV fluids and IV antibiotic therapy for urine infection.   04/16 patient continue to have pain right knee, not well controlled.

## 2022-10-18 NOTE — Care Management Important Message (Signed)
Important Message  Patient Details  Name: [name] MRN: [id number] Date of Birth: December 18, 1945   Medicare Important Message Given:  Yes     Dorena Bodo 10/18/2022, 2:57 PM

## 2022-10-19 DIAGNOSIS — G9341 Metabolic encephalopathy: Secondary | ICD-10-CM | POA: Diagnosis not present

## 2022-10-19 DIAGNOSIS — E876 Hypokalemia: Secondary | ICD-10-CM | POA: Diagnosis not present

## 2022-10-19 DIAGNOSIS — N3000 Acute cystitis without hematuria: Secondary | ICD-10-CM | POA: Diagnosis not present

## 2022-10-19 DIAGNOSIS — I10 Essential (primary) hypertension: Secondary | ICD-10-CM | POA: Diagnosis not present

## 2022-10-19 LAB — GLUCOSE, CAPILLARY
Glucose-Capillary: 100 mg/dL — ABNORMAL HIGH (ref 70–99)
Glucose-Capillary: 115 mg/dL — ABNORMAL HIGH (ref 70–99)
Glucose-Capillary: 121 mg/dL — ABNORMAL HIGH (ref 70–99)
Glucose-Capillary: 137 mg/dL — ABNORMAL HIGH (ref 70–99)
Glucose-Capillary: 149 mg/dL — ABNORMAL HIGH (ref 70–99)
Glucose-Capillary: 150 mg/dL — ABNORMAL HIGH (ref 70–99)
Glucose-Capillary: 92 mg/dL (ref 70–99)

## 2022-10-19 LAB — CULTURE, BLOOD (ROUTINE X 2)

## 2022-10-19 MED ORDER — OXYCODONE HCL ER 10 MG PO T12A
10.0000 mg | EXTENDED_RELEASE_TABLET | Freq: Two times a day (BID) | ORAL | Status: DC
  Administered 2022-10-19: 10 mg via ORAL
  Filled 2022-10-19: qty 1

## 2022-10-19 NOTE — Plan of Care (Signed)
Patient AOX2, disoriented to time and situation.  VSS throughout shift.  All meds given on time as ordered.  Pt denied pain and SOB.  Diminished lungs, IS encouraged.  Purewick in place.  POC maintained, will continue to monitor.  Problem: Education: Goal: Ability to describe self-care measures that may prevent or decrease complications (Diabetes Survival Skills Education) will improve Outcome: Progressing Goal: Individualized Educational Video(s) Outcome: Progressing   Problem: Coping: Goal: Ability to adjust to condition or change in health will improve Outcome: Progressing   Problem: Fluid Volume: Goal: Ability to maintain a balanced intake and output will improve Outcome: Progressing   Problem: Health Behavior/Discharge Planning: Goal: Ability to identify and utilize available resources and services will improve Outcome: Progressing Goal: Ability to manage health-related needs will improve Outcome: Progressing   Problem: Metabolic: Goal: Ability to maintain appropriate glucose levels will improve Outcome: Progressing   Problem: Nutritional: Goal: Maintenance of adequate nutrition will improve Outcome: Progressing Goal: Progress toward achieving an optimal weight will improve Outcome: Progressing   Problem: Skin Integrity: Goal: Risk for impaired skin integrity will decrease Outcome: Progressing   Problem: Tissue Perfusion: Goal: Adequacy of tissue perfusion will improve Outcome: Progressing   Problem: Education: Goal: Knowledge of General Education information will improve Description: Including pain rating scale, medication(s)/side effects and non-pharmacologic comfort measures Outcome: Progressing   Problem: Health Behavior/Discharge Planning: Goal: Ability to manage health-related needs will improve Outcome: Progressing   Problem: Clinical Measurements: Goal: Ability to maintain clinical measurements within normal limits will improve Outcome:  Progressing Goal: Will remain free from infection Outcome: Progressing Goal: Diagnostic test results will improve Outcome: Progressing Goal: Respiratory complications will improve Outcome: Progressing Goal: Cardiovascular complication will be avoided Outcome: Progressing   Problem: Activity: Goal: Risk for activity intolerance will decrease Outcome: Progressing   Problem: Nutrition: Goal: Adequate nutrition will be maintained Outcome: Progressing   Problem: Coping: Goal: Level of anxiety will decrease Outcome: Progressing   Problem: Elimination: Goal: Will not experience complications related to bowel motility Outcome: Progressing Goal: Will not experience complications related to urinary retention Outcome: Progressing   Problem: Pain Managment: Goal: General experience of comfort will improve Outcome: Progressing   Problem: Safety: Goal: Ability to remain free from injury will improve Outcome: Progressing   Problem: Skin Integrity: Goal: Risk for impaired skin integrity will decrease Outcome: Progressing   

## 2022-10-19 NOTE — TOC Progression Note (Signed)
Transition of Care [facility]) - Progression Note    Patient Details  Name: [name] MRN: [id number] Date of Birth: 1946-04-09  Transition of [facility]) CM/SW Contact  Erin Sons, [place] Phone Number: 10/19/2022, 12:04 PM  Clinical Narrative:     CSW spoke with son, Aurther Loft, over the phone. He chooses [facility]. Son requests communication by email at [email or web address]. [facility] can admit pt today. They will email son to discuss paperwork.   1200: Attending notified CSW that pt won't DC today, may possibly DC tomorrow. CSW updated [facility] and pt's son via email.      Expected Discharge Plan: Skilled Nursing Facility Barriers to Discharge: Continued Medical Work up  Expected Discharge Plan and Services       Living arrangements for the past 2 months: Skilled Nursing Facility                                       Social Determinants of Health (SDOH) Interventions SDOH Screenings   Food Insecurity: No Food Insecurity (10/13/2022)  Housing: Low Risk  (10/13/2022)  Transportation Needs: No Transportation Needs (10/13/2022)  Utilities: Not At Risk (10/13/2022)  Depression (PHQ2-9): Low Risk  (02/10/2022)  Tobacco Use: Medium Risk (03/28/2022)    Readmission Risk Interventions     No data to display

## 2022-10-19 NOTE — Progress Notes (Addendum)
Progress Note   Patient: [name] ZOX:[id number] DOB: 11/22/1945 DOA: 10/13/2022     4 DOS: the patient was seen and examined on 10/19/2022   Brief hospital course: [name] was admitted to the hospital with the working diagnosis of acute metabolic encephalopathy.   77 yo female with the past medical history of T2DM, hypertension, dyslipidemia and CVA with right sided hemiparesis who presented with right knee pain. She had a right femur fracture since 2021, that has been managed non operative due to poor functional status and non ambulatory state. She was noted to have worsening right lower extremity pain and poor oral intake that prompted her transfer from SNF to the ED. On her initial physical examination her blood pressure was 110/77, HR 83, RR 16 and 02 saturation 97%, lungs with no wheezing or rales, heart with S1 and S2 present and rhythmic, abdomen with no distention, right knee was tender to palpation. Positive right sided hemiparesis.   Na 143, K 3,8 Cl 107 bicarbonate 23, glucose 94 bun 9 cr 0,94  Wbc 10,9 hgb 13.2 plt 163  Urine analysis SG 1,011, 30 protein, large leukocytes, moderate Hgb, >50 wbc, 6-10 rbc. Rare bacteria.   Chest radiograph hypo-inflated, with mild cardiomegaly, with fluid in the right fissure. No infiltrates.  Patient was placed on IV fluids and IV antibiotic therapy for urine infection.   04/16 patient continue to have pain right knee, not well controlled.   Assessment and Plan: * Acute metabolic encephalopathy Likely multifactorial including urinary tract infection and uncontrolled right knee pain.  Clinically resolving.   UTI (urinary tract infection) Present on admission, with no sepsis.  Urine cultures positive for klebsiella pneumonia and enterococcus, resistant to ampicillin.   Patient has been in Unasyn IV, complete antibiotic therapy today.   Right knee pain Persistent right knee pain.  Right posterior calf with full thickness wound,  present on admission.   Right femur and knee radiograph Femoral intramedullary nail with increased peri-hardware lucency about the proximal aspect of the femoral component concerning for loosening.  Interval progression of the osseous remodeling about the chronic supracondylar fracture of the right femur. Tricompartmental knee ostearthritis.   Patient continue to have significant pain right knee.  Plan to add long acting oxycodone 10 mg bid, continue with scheduled ibuprofen and acetaminophen. Topical diclofenac.  Continue with as needed short acting oxycodone.   [place] with no evidence of deep vein thrombosis on left or right lower extremities.   Late entry: patient has not received short acting oxycodone, will hold on further long acting oxycodone, try short acting first  Type 2 diabetes mellitus Continue insulin sliding scale for glucose cover and monitoring.   Hypertension Holding blood pressure medications to prevent hypotension.  Heart failure with diastolic dysfunction, with no exacerbation.  Coronary artery disease.    History of stroke Continue close blood pressure monitoring. PT and OT.  Chronic right sided hemiparesis with no ambulatory state.   PFO on apixaban.   Partial symptomatic epilepsy with complex partial seizures, not intractable, without status epilepticus Cont keppra  Hypokalemia Hypomagnesemia.  Renal function stable and electrolytes have been corrected.         Subjective: Patient with persistent pain right knee, worse to the touch and minimal movement. No dyspnea, no chest pain.   Physical Exam: Vitals:   10/18/22 0737 10/18/22 1712 10/18/22 2118 10/19/22 0459  BP: 122/83 (!) 117/95 122/79 132/72  Pulse: 93 91 79 80  Resp: 16 16  Temp: 98 F (36.7 C) 98.4 F (36.9 C) 97.6 F (36.4 C) (!) 97.4 F (36.3 C)  TempSrc: Oral Oral Oral Oral  SpO2: 99% 100% 99%   Weight:       Neurology awake and alert ENT with mild pallor Cardiovascular  with S1 and S2 present and rhythmic with no gallops, rubs or murmurs Respiratory with no rales or wheezing Abdomen with no distention  Positive bilateral lower extremity edema, more right than left. Positive tender right knee, with no local erythema or increased local temperature.  Data Reviewed:    Family Communication: no family at the bedside. I spoke with patient's son over the phone,we talked in detail about patient's condition, plan of care and prognosis and all questions were addressed.   Disposition: Status is: Inpatient Remains inpatient appropriate because: SNF when better pain control   Planned Discharge Destination: Skilled nursing facility      Author: Coralie Keens, MD 10/19/2022 3:44 PM  For on call review [email or web address].

## 2022-10-20 ENCOUNTER — Inpatient Hospital Stay (HOSPITAL_COMMUNITY): Payer: Medicare PPO

## 2022-10-20 DIAGNOSIS — G9341 Metabolic encephalopathy: Secondary | ICD-10-CM | POA: Diagnosis not present

## 2022-10-20 LAB — GLUCOSE, CAPILLARY
Glucose-Capillary: 107 mg/dL — ABNORMAL HIGH (ref 70–99)
Glucose-Capillary: 105 mg/dL — ABNORMAL HIGH (ref 70–99)
Glucose-Capillary: 229 mg/dL — ABNORMAL HIGH (ref 70–99)
Glucose-Capillary: 174 mg/dL — ABNORMAL HIGH (ref 70–99)
Glucose-Capillary: 123 mg/dL — ABNORMAL HIGH (ref 70–99)

## 2022-10-20 LAB — CULTURE, BLOOD (ROUTINE X 2)
Culture: NO GROWTH
Special Requests: ADEQUATE

## 2022-10-20 MED ORDER — GABAPENTIN 100 MG PO CAPS
100.0000 mg | ORAL_CAPSULE | Freq: Three times a day (TID) | ORAL | Status: DC
  Administered 2022-10-20 – 2022-10-21 (×4): 100 mg via ORAL
  Filled 2022-10-20 (×4): qty 1

## 2022-10-20 MED ORDER — INSULIN ASPART 100 UNIT/ML IJ SOLN
0.0000 [IU] | Freq: Three times a day (TID) | INTRAMUSCULAR | Status: DC
  Administered 2022-10-21: 2 [IU] via SUBCUTANEOUS
  Administered 2022-10-21: 3 [IU] via SUBCUTANEOUS

## 2022-10-20 MED ORDER — INSULIN ASPART 100 UNIT/ML IJ SOLN: 0.0000 [IU] | Freq: Every day | INTRAMUSCULAR | Status: DC

## 2022-10-20 NOTE — Plan of Care (Signed)
Patient AOX3, disoriented to time. VSS throughout shift. All meds given on time as ordered. Pt denied pain and SOB. Diminished lungs, IS encouraged. Purewick in place. POC maintained, will continue to monitor.  Problem: Education: Goal: Ability to describe self-care measures that may prevent or decrease complications (Diabetes Survival Skills Education) will improve Outcome: Progressing Goal: Individualized Educational Video(s) Outcome: Progressing   Problem: Coping: Goal: Ability to adjust to condition or change in health will improve Outcome: Progressing   Problem: Fluid Volume: Goal: Ability to maintain a balanced intake and output will improve Outcome: Progressing   Problem: Health Behavior/Discharge Planning: Goal: Ability to identify and utilize available resources and services will improve Outcome: Progressing Goal: Ability to manage health-related needs will improve Outcome: Progressing   Problem: Metabolic: Goal: Ability to maintain appropriate glucose levels will improve Outcome: Progressing   Problem: Nutritional: Goal: Maintenance of adequate nutrition will improve Outcome: Progressing Goal: Progress toward achieving an optimal weight will improve Outcome: Progressing   Problem: Skin Integrity: Goal: Risk for impaired skin integrity will decrease Outcome: Progressing   Problem: Tissue Perfusion: Goal: Adequacy of tissue perfusion will improve Outcome: Progressing   Problem: Education: Goal: Knowledge of General Education information will improve Description: Including pain rating scale, medication(s)/side effects and non-pharmacologic comfort measures Outcome: Progressing   Problem: Health Behavior/Discharge Planning: Goal: Ability to manage health-related needs will improve Outcome: Progressing   Problem: Clinical Measurements: Goal: Ability to maintain clinical measurements within normal limits will improve Outcome: Progressing Goal: Will remain free  from infection Outcome: Progressing Goal: Diagnostic test results will improve Outcome: Progressing Goal: Respiratory complications will improve Outcome: Progressing Goal: Cardiovascular complication will be avoided Outcome: Progressing   Problem: Activity: Goal: Risk for activity intolerance will decrease Outcome: Progressing   Problem: Nutrition: Goal: Adequate nutrition will be maintained Outcome: Progressing   Problem: Coping: Goal: Level of anxiety will decrease Outcome: Progressing   Problem: Elimination: Goal: Will not experience complications related to bowel motility Outcome: Progressing Goal: Will not experience complications related to urinary retention Outcome: Progressing   Problem: Pain Managment: Goal: General experience of comfort will improve Outcome: Progressing   Problem: Safety: Goal: Ability to remain free from injury will improve Outcome: Progressing   Problem: Skin Integrity: Goal: Risk for impaired skin integrity will decrease Outcome: Progressing

## 2022-10-20 NOTE — Progress Notes (Signed)
[facility] Progress Note Patient: [name] ZOX:[id number] DOB: 1945-12-22 DOA: 10/13/2022  DOS: the patient was seen and examined on 10/20/2022  Brief hospital course: 77 yo female with the past medical history of T2DM, hypertension, dyslipidemia and CVA with right sided hemiparesis who presented with right knee pain from SNF [facility]. She had a right femur fracture since 2021, that has been managed non operative due to poor functional status and non ambulatory state.  She was noted to have worsening right lower extremity pain and poor oral intake that prompted her transfer from SNF to the ED.  There was some concern for a possible UTI and the patient was placed on IV fluids and IV antibiotic therapy for urine infection. Due to ongoing pain and CT knee and tibia was performed which does show evidence of acute minimally displaced medial tibial plateau fracture as well as nondisplaced fracture of the tibial tuberosity.  Distal femur fracture deformity appears to be healed. Assessment and Plan: Acute on chronic right knee pain. Acute tibial plateau fracture as well as tibial tuberosity fracture on right Presents with complaints of onset of right knee pain. Patient has prior history of right distal femur fracture which was managed conservatively. Initially x-ray showed increased perihardware lucency about the proximal aspect of the femoral component concerning for loosening. Orthopedic was consulted. Recommended conservative measures as well as further workup for possible radiculopathy. On my evaluation patient does not have any objective findings of left leg pain or pain involving right ankle but does have complaint of right knee and forefoot pain with bruising and swelling. Discussed with orthopedic on 4/17.  Will monitor recommendation.  Continue pain control.  Most likely not a great candidate for surgical intervention.  Bilateral lower extremity edema.  Right more than  left Ultrasound negative for any Doppler. Right leg possibly chronically swollen from hemiparesis.  Chronic debility secondary to history of CVA with right-sided hemiparesis. Bedbound at her baseline. Unsure how the injury happened. Monitor for now.  Concern for UTI. Concern for acute metabolic encephalopathy. There was some concern for confusion at the time of admission. Treated with IV antibiotics. Urine culture positive for Klebsiella and Enterococcus. Antibiotic course completed in the hospital.  Type 2 diabetes mellitus controlled with long-term insulin use with possible neuropathy. Patient subjectively reports bilateral leg pain but objectively does not appear to have any significant pain. Possibility of neuropathy from long-standing DM cannot be ruled out. On gabapentin. Will increase the dose to 100 mg 3 times daily. Continue sliding scale insulin. Holding metformin.  Hemoglobin A1c 6.2.  Hypertension Blood pressure stable.  Not on any medication.  Holding losartan and Toprol-XL.  Chronic HFpEF EF in 2021 55 to 60% with grade 1 diastolic dysfunction. Does not appear to be volume overloaded right now.  PFO History of CVA. On Eliquis. Continue.  History of epilepsy with CPS On Keppra 5 mg twice daily. Will continue.  Hypokalemia Hypomagnesemia Replaced. Monitor.  HLD. On Lipitor 20 mg daily. Currently on hold.   Subjective: Continues to report severe bilateral pain but does not appear to have any pain on examination of the left leg.  No nausea no vomiting.  No other acute complaint.  Physical Exam: General: in moderate distress, No Rash Cardiovascular: S1 and S2 Present, No Murmur Respiratory: Good respiratory effort, Bilateral Air entry present. No Crackles, No wheezes Abdomen: Bowel Sound present, No tenderness Extremities: Bilateral right more than left edema No pain on examination of left leg.  Able to  actively move her left leg. No pain on the  examination of right ankle.  Significant tenderness and right forefoot as well as right knee area.  Swelling and bruising noted. Neuro: Alert and oriented place and self, no new focal deficit, chronic right-sided weakness  Data Reviewed: I have Reviewed nursing notes, Vitals, and Lab results. Since last encounter, pertinent lab results CBC and BMP   . I have ordered test including CBC and BMP  . I have discussed pt's care plan and test results with orthopedics  . I have ordered imaging right knee and tibia-fibula CT scan  .   Disposition: Status is: Inpatient  apixaban (ELIQUIS) tablet 5 mg   Family Communication: No one at bedside Level of care: Med-Surg   Vitals:   10/19/22 1952 10/20/22 0404 10/20/22 0836 10/20/22 1647  BP: 126/80 109/76 125/74 137/78  Pulse: 95 78 80 90  Resp: Temp: 98.2 F (36.8 C) (!) 96.8 F (36 C) 98.3 F (36.8 C) 98 F (36.7 C)  TempSrc: Oral Axillary    SpO2: 95% 97% 96% 100%  Weight:         Author: Lynden Oxford, MD 10/20/2022 6:07 PM  Please look on [email or web address] to find out who is on call.

## 2022-10-21 DIAGNOSIS — Z794 Long term (current) use of insulin: Secondary | ICD-10-CM | POA: Insufficient documentation

## 2022-10-21 DIAGNOSIS — I69951 Hemiplegia and hemiparesis following unspecified cerebrovascular disease affecting right dominant side: Secondary | ICD-10-CM | POA: Insufficient documentation

## 2022-10-21 DIAGNOSIS — M6281 Muscle weakness (generalized): Secondary | ICD-10-CM | POA: Insufficient documentation

## 2022-10-21 DIAGNOSIS — R600 Localized edema: Secondary | ICD-10-CM | POA: Insufficient documentation

## 2022-10-21 DIAGNOSIS — G9341 Metabolic encephalopathy: Secondary | ICD-10-CM | POA: Diagnosis not present

## 2022-10-21 LAB — BASIC METABOLIC PANEL
Anion gap: 10 (ref 5–15)
BUN: 8 mg/dL (ref 8–23)
CO2: 25 mmol/L (ref 22–32)
Calcium: 9.1 mg/dL (ref 8.9–10.3)
Chloride: 106 mmol/L (ref 98–111)
Creatinine, Ser: 0.78 mg/dL (ref 0.44–1.00)
GFR, Estimated: >60 mL/min (ref >60)
Glucose, Bld: 119 mg/dL — ABNORMAL HIGH (ref 70–99)
Potassium: 4.4 mmol/L (ref 3.5–5.1)
Sodium: 141 mmol/L (ref 135–145)

## 2022-10-21 LAB — CBC
HCT: 38.7 % (ref 36.0–46.0)
Hemoglobin: 11.7 g/dL — ABNORMAL LOW (ref 12.0–15.0)
MCH: 27.5 pg (ref 26.0–34.0)
MCHC: 30.2 g/dL (ref 30.0–36.0)
MCV: 90.8 fL (ref 80.0–100.0)
Platelets: 245 10*3/uL (ref 150–400)
RBC: 4.26 MIL/uL (ref 3.87–5.11)
RDW: 13.2 % (ref 11.5–15.5)
WBC: 8.2 10*3/uL (ref 4.0–10.5)
nRBC: 0 % (ref 0.0–0.2)

## 2022-10-21 LAB — GLUCOSE, CAPILLARY
Glucose-Capillary: 102 mg/dL — ABNORMAL HIGH (ref 70–99)
Glucose-Capillary: 159 mg/dL — ABNORMAL HIGH (ref 70–99)
Glucose-Capillary: 187 mg/dL — ABNORMAL HIGH (ref 70–99)
Glucose-Capillary: 214 mg/dL — ABNORMAL HIGH (ref 70–99)

## 2022-10-21 LAB — MAGNESIUM: Magnesium: 1.9 mg/dL (ref 1.7–2.4)

## 2022-10-21 MED ORDER — DICLOFENAC SODIUM 1 % EX GEL: 4.0000 g | Freq: Four times a day (QID) | CUTANEOUS | 0 refills | Status: DC

## 2022-10-21 MED ORDER — GABAPENTIN 300 MG PO CAPS: 300.0000 mg | ORAL_CAPSULE | Freq: Every day | ORAL | 0 refills | Status: DC

## 2022-10-21 MED ORDER — ATORVASTATIN CALCIUM 10 MG PO TABS
20.0000 mg | ORAL_TABLET | Freq: Every day | ORAL | Status: DC
  Administered 2022-10-21: 20 mg via ORAL
  Filled 2022-10-21: qty 2

## 2022-10-21 MED ORDER — OXYCODONE HCL 5 MG PO TABS: 5.0000 mg | ORAL_TABLET | Freq: Four times a day (QID) | ORAL | 0 refills | Status: DC | PRN

## 2022-10-21 MED ORDER — TRESIBA FLEXTOUCH 100 UNIT/ML ~~LOC~~ SOPN: 10.0000 [IU] | PEN_INJECTOR | Freq: Every day | SUBCUTANEOUS | 0 refills | Status: DC

## 2022-10-21 MED ORDER — ACETAMINOPHEN 500 MG PO TABS: 500.0000 mg | ORAL_TABLET | Freq: Four times a day (QID) | ORAL | Status: AC

## 2022-10-21 MED ORDER — METHOCARBAMOL 500 MG PO TABS: 500.0000 mg | ORAL_TABLET | Freq: Three times a day (TID) | ORAL | 0 refills | Status: DC | PRN

## 2022-10-21 NOTE — Discharge Summary (Addendum)
Physician Discharge Summary   Patient: [name] MRN: [id number] DOB: [birth year]  Admit date:     10/13/2022  Discharge date: 10/21/22  Discharge Physician: Lynden Oxford  PCP: Corwin Levins, MD  Recommendations at discharge: Follow up with PCP in 1 week  Follow up with Orthopedics as recommended  Continue Palliative care follow ups.  Recommend [facility] for transfer.   Follow-up Information     Corwin Levins, MD. Schedule an appointment as soon as possible for a visit in 1 week(s).   Specialties: Internal Medicine, Radiology Contact information: [address] [place] [address] [phone]         Sheral Apley, MD. Schedule an appointment as soon as possible for a visit in 1 month(s).   Specialty: Orthopedic Surgery Contact information: [address] [place] [address] [phone]                Discharge Diagnoses: Principal Problem:   Acute metabolic encephalopathy Active Problems:   UTI (urinary tract infection)   Right knee pain   Type 2 diabetes mellitus   Hypertension   History of stroke   Partial symptomatic epilepsy with complex partial seizures, not intractable, without status epilepticus   Hypokalemia  Hospital Course: 77 yo female with the past medical history of T2DM, hypertension, dyslipidemia and CVA with right sided hemiparesis who presented with right knee pain from SNF [facility]. She had a right femur fracture since 2021, that has been managed non operative due to poor functional status and non ambulatory state.  She was noted to have worsening right lower extremity pain and poor oral intake that prompted her transfer from SNF to the ED.  There was some concern for a possible UTI and the patient was placed on IV fluids and IV antibiotic therapy for urine infection. Due to ongoing pain and CT knee and tibia was performed which does show evidence of acute minimally displaced medial tibial plateau  fracture as well as nondisplaced fracture of the tibial tuberosity.  Distal femur fracture deformity appears to be healed. Assessment and Plan  Acute on chronic right knee pain. Acute tibial plateau fracture as well as tibial tuberosity fracture on right Presents with complaints of onset of right knee pain. Patient has prior history of right distal femur fracture which was managed conservatively. Initially x-ray showed increased perihardware lucency about the proximal aspect of the femoral component concerning for loosening. Orthopedic was consulted. Recommended conservative measures as well as further workup for possible radiculopathy. On my evaluation patient does not have any objective findings of left leg pain or pain involving right ankle but does have complaint of right knee and forefoot pain with bruising and swelling. Discussed with orthopedic on 4/17.  Discussed again with Freeman Caldron, PA-C on 4/18. Recommended non operative management with knee immobilizer and pain control. Recommended non weight bearing on right foot. Continue pain control, started with scheduled tylenol, and gabapentin.  PRN robaxin and oxy IR.   Bilateral lower extremity edema.  Right more than left Ultrasound negative for any Doppler. Right leg possibly chronically swollen from hemiparesis.   Chronic debility secondary to history of CVA with right-sided hemiparesis. Bedbound at her baseline. Unsure how the injury happened. Monitor for now.   Concern for UTI. Concern for acute metabolic encephalopathy. There was some concern for confusion at the time of admission. Treated with IV antibiotics. Urine culture positive for Klebsiella and Enterococcus. Antibiotic course completed in the  hospital.   Type 2 diabetes mellitus controlled with long-term insulin use with possible neuropathy. Patient subjectively reports bilateral leg pain but objectively does not appear to have any significant  pain. Possibility of neuropathy from long-standing DM cannot be ruled out. On gabapentin. Resume metformin.  Hemoglobin A1c 6.2. reduce tresiba dose to 10 qhs    Hypertension Resume losartan and Toprol-XL.   Chronic HFpEF EF in 2021 55 to 60% with grade 1 diastolic dysfunction. Does not appear to be volume overloaded right now.   PFO History of CVA. On Eliquis. Continue.   History of epilepsy with CPS On Keppra 500 mg twice daily. Will continue.   Hypokalemia Hypomagnesemia Replaced. Monitor.   HLD. On Lipitor 20 mg daily. Currently on hold.   Pain control - [employer] Controlled Substance Reporting System database was reviewed. and patient was instructed, not to drive, operate heavy machinery, perform activities at heights, swimming or participation in water activities or provide baby-sitting services while on Pain, Sleep and Anxiety Medications; until their outpatient Physician has advised to do so again. Also recommended to not to take more than prescribed Pain, Sleep and Anxiety Medications.  Consultants:  Orthopedics  Procedures performed:  none  DISCHARGE MEDICATION: Allergies as of 10/21/2022       Reactions   Pork-derived Products Other (See Comments)   To keep blood pressure down        Medication List     TAKE these medications    acetaminophen 500 MG tablet Commonly known as: TYLENOL Take 1 tablet (500 mg total) by mouth in the morning, at noon, in the evening, and at bedtime for 7 days.   atorvastatin 40 MG tablet Commonly known as: LIPITOR Take 0.5 tablets (20 mg total) by mouth daily.   diclofenac Sodium 1 % Gel Commonly known as: VOLTAREN Apply 4 g topically 4 (four) times daily.   Eliquis 5 MG Tabs tablet Generic drug: apixaban Take 5 mg by mouth 2 (two) times daily.   gabapentin 300 MG capsule Commonly known as: NEURONTIN Take 1 capsule (300 mg total) by mouth at bedtime.   levETIRAcetam 500 MG tablet Commonly known as:  KEPPRA Take 1 tablet (500 mg total) by mouth 2 (two) times daily.   losartan 25 MG tablet Commonly known as: COZAAR Take 12.5 mg by mouth daily.   metFORMIN 750 MG 24 hr tablet Commonly known as: GLUCOPHAGE-XR Take 1 tablet (750 mg total) by mouth daily with breakfast.   methocarbamol 500 MG tablet Commonly known as: ROBAXIN Take 1 tablet (500 mg total) by mouth every 8 (eight) hours as needed for muscle spasms.   metoprolol succinate 25 MG 24 hr tablet Commonly known as: TOPROL-XL Take 25 mg by mouth daily.   nystatin cream Commonly known as: MYCOSTATIN Apply 1 Application topically See admin instructions. Apply topically 2 times a day for cutaneous or mucocutaneous candida rash of abdomen folds, groin and under breasts for 21 days. D/C order if rash is healed after 21 days. **Update MD if rash not resolved at that time**   oxyCODONE 5 MG immediate release tablet Commonly known as: Oxy IR/ROXICODONE Take 1 tablet (5 mg total) by mouth every 6 (six) hours as needed for severe pain or moderate pain.   polyethylene glycol 17 g packet Commonly known as: MIRALAX / GLYCOLAX Take 17 g by mouth daily as needed for mild constipation or moderate constipation. Use Miralax if Milk of Magnesia is contraindicated (Example: Dialysis resident)   ROBITUSSIN COUGH/COLD CF  PO Take 10 mLs by mouth every 4 (four) hours as needed (Cough). **Notify MD if cough persists, congestion x3 days or accompanied by feverEvaristo Bury FlexTouch 100 UNIT/ML FlexTouch Pen Generic drug: insulin degludec Inject 10 Units into the skin at bedtime. What changed:  how much to take when to take this additional instructions   Vitamin D3 50 MCG (2000 UT) Tabs Take 1 tablet by mouth daily.               Discharge Care Instructions  (From admission, onward)           Start     Ordered   10/21/22 0000  Non weight bearing       Question Answer Comment  Laterality right   Extremity Lower       10/21/22 1257   10/21/22 0000  Discharge wound care:       Comments: Apply Xeroform gauze to right posterior leg Q day, then cover with foam dressing.  (Change foam dressing Q 3 days or PRN soiling.)   10/21/22 1257           Disposition: SNF Diet recommendation: Regular diet  Discharge Exam: Vitals:   10/20/22 2034 10/21/22 0319 10/21/22 0803 10/21/22 1000  BP: 132/84 (!) 157/88 (!) 147/92   Pulse: 81 73 77   Resp: 18 16    Temp: 97.6 F (36.4 C)     TempSrc:      SpO2: 100% 99% 100%   Weight:      Height:    5\' 6"  (1.676 m)   General: Appear in mild distress; no visible Abnormal Neck Mass Or lumps, Conjunctiva normal Cardiovascular: S1 and S2 Present, no Murmur, Respiratory: good respiratory effort, Bilateral Air entry present and CTA, no Crackles, no wheezes Abdomen: Bowel Sound present, Non tender  Extremities: bilateral right more than left Pedal edema Neurology: alert and oriented to person and place Filed Weights   10/13/22 2314  Weight: 66.6 kg   Condition at discharge: stable  The results of significant diagnostics from this hospitalization (including imaging, microbiology, ancillary and laboratory) are listed below for reference.   Imaging Studies: CT TIBIA FIBULA RIGHT WO CONTRAST  Result Date: 10/20/2022 CLINICAL DATA:  Lower leg trauma; Knee trauma, occult fracture suspected, xray done EXAM: CT OF THE RIGHT KNEE WITHOUT CONTRAST CT OF THE RIGHT TIBIA FIBULA WITHOUT CONTRAST TECHNIQUE: Multidetector CT imaging of the right lower extremity was performed according to the standard protocol. RADIATION DOSE REDUCTION: This exam was performed according to the departmental dose-optimization program which includes automated exposure control, adjustment of the mA and/or kV according to patient size and/or use of iterative reconstruction technique. COMPARISON:  X-ray 10/13/2022 FINDINGS: Bones/Joint/Cartilage Diffuse osseous demineralization. Prior right femoral ORIF with  long intramedullary rod within the visualized distal femur. Old, healed fracture deformity of the distal femur. Acute fracture involving the central to posterior aspects of the medial tibial plateau with 2 mm of articular-surface depression (series 9, image 56). No definite fracture involvement of the lateral tibial plateau articular surface, although there may be a nondisplaced component in the subchondral zone of the lateral plateau. Nondisplaced fracture at the tibial tuberosity extending to the proximal tibial metadiaphysis along its lateral cortex (series 5, image 104). Small knee joint hemarthrosis. The fibula as well as the mid to distal aspect of the right tibia are intact. Band-like area of sclerosis within the calcaneal body (series 9, image 113). No malalignment. Degenerative osteoarthritis of the knee and  ankle. No suspicious lytic or sclerotic bone lesion. Ligaments Suboptimally assessed by CT. Muscles and Tendons Generalized muscle atrophy.  No acute tendon abnormality. Soft tissues Mild soft tissue swelling at the anterior and medial aspects of the knee. No organized hematoma. Generalized nonspecific subcutaneous edema at the distal lower leg and ankle. IMPRESSION: 1. Acute minimally depressed medial tibial plateau fracture. No definite fracture extension to the lateral tibial plateau articular surface, although there may be a nondisplaced component in the subchondral zone of the lateral plateau. 2. Nondisplaced fracture at the tibial tuberosity extending to the proximal tibial metadiaphysis along its lateral cortex. 3. No distal tibial or fibular fractures. 4. Band-like area of sclerosis within the calcaneal body. Stress fracture could have this appearance. 5. Small knee joint hemarthrosis. Electronically Signed   By: Duanne Guess D.O.   On: 10/20/2022 16:42   CT KNEE RIGHT WO CONTRAST  Result Date: 10/20/2022 CLINICAL DATA:  Lower leg trauma; Knee trauma, occult fracture suspected, xray done  EXAM: CT OF THE RIGHT KNEE WITHOUT CONTRAST CT OF THE RIGHT TIBIA FIBULA WITHOUT CONTRAST TECHNIQUE: Multidetector CT imaging of the right lower extremity was performed according to the standard protocol. RADIATION DOSE REDUCTION: This exam was performed according to the departmental dose-optimization program which includes automated exposure control, adjustment of the mA and/or kV according to patient size and/or use of iterative reconstruction technique. COMPARISON:  X-ray 10/13/2022 FINDINGS: Bones/Joint/Cartilage Diffuse osseous demineralization. Prior right femoral ORIF with long intramedullary rod within the visualized distal femur. Old, healed fracture deformity of the distal femur. Acute fracture involving the central to posterior aspects of the medial tibial plateau with 2 mm of articular-surface depression (series 9, image 56). No definite fracture involvement of the lateral tibial plateau articular surface, although there may be a nondisplaced component in the subchondral zone of the lateral plateau. Nondisplaced fracture at the tibial tuberosity extending to the proximal tibial metadiaphysis along its lateral cortex (series 5, image 104). Small knee joint hemarthrosis. The fibula as well as the mid to distal aspect of the right tibia are intact. Band-like area of sclerosis within the calcaneal body (series 9, image 113). No malalignment. Degenerative osteoarthritis of the knee and ankle. No suspicious lytic or sclerotic bone lesion. Ligaments Suboptimally assessed by CT. Muscles and Tendons Generalized muscle atrophy.  No acute tendon abnormality. Soft tissues Mild soft tissue swelling at the anterior and medial aspects of the knee. No organized hematoma. Generalized nonspecific subcutaneous edema at the distal lower leg and ankle. IMPRESSION: 1. Acute minimally depressed medial tibial plateau fracture. No definite fracture extension to the lateral tibial plateau articular surface, although there may be  a nondisplaced component in the subchondral zone of the lateral plateau. 2. Nondisplaced fracture at the tibial tuberosity extending to the proximal tibial metadiaphysis along its lateral cortex. 3. No distal tibial or fibular fractures. 4. Band-like area of sclerosis within the calcaneal body. Stress fracture could have this appearance. 5. Small knee joint hemarthrosis. Electronically Signed   By: Duanne Guess D.O.   On: 10/20/2022 16:42   VAS [place] LOWER EXTREMITY VENOUS (DVT)  Result Date: 10/18/2022  Lower Venous DVT Study Patient Name:  JERICHO CIESLIK  Date of Exam:   10/18/2022 Medical Rec #: [id number]        Accession #:    [id number] Date of Birth: [birth year]         Patient Gender: F Patient Age:   4 years Exam Location:  [facility] Procedure:  VAS [place] LOWER EXTREMITY VENOUS (DVT) Referring Phys: Parke Poisson XU --------------------------------------------------------------------------------  Indications: Pain, and Edema.  Limitations: Poor ultrasound/tissue interface and patient unable to position properly, patient pain intolerance. Comparison Study: Previous RLEV exam on 10/24/15 was negative for DVT Performing Technologist: Ernestene Mention RVT, RDMS  Examination Guidelines: A complete evaluation includes B-mode imaging, spectral Doppler, color Doppler, and power Doppler as needed of all accessible portions of each vessel. Bilateral testing is considered an integral part of a complete examination. Limited examinations for reoccurring indications may be performed as noted. The reflux portion of the exam is performed with the patient in reverse Trendelenburg.  +--------+---------------+---------+-----------+----------+--------------------+ RIGHT   CompressibilityPhasicitySpontaneityPropertiesThrombus Aging       +--------+---------------+---------+-----------+----------+--------------------+ CFV     Full           Yes      Yes                                        +--------+---------------+---------+-----------+----------+--------------------+ SFJ     Full                                                              +--------+---------------+---------+-----------+----------+--------------------+ FV Prox Full           Yes      Yes                                       +--------+---------------+---------+-----------+----------+--------------------+ FV Mid  Full           Yes      Yes                                       +--------+---------------+---------+-----------+----------+--------------------+ FV      Full           Yes      Yes                                       Distal                                                                    +--------+---------------+---------+-----------+----------+--------------------+ PFV     Full                                                              +--------+---------------+---------+-----------+----------+--------------------+ POP                    Yes      Yes  patent by                                                                 doppler/color        +--------+---------------+---------+-----------+----------+--------------------+ PTV     Full                                         Not well visualized  +--------+---------------+---------+-----------+----------+--------------------+ PERO    Full                                         Not well visualized  +--------+---------------+---------+-----------+----------+--------------------+ Unable to perform compression images on popliteal vein due to position and pain intolerance  +--------+---------------+---------+-----------+----------+--------------------+ LEFT    CompressibilityPhasicitySpontaneityPropertiesThrombus Aging       +--------+---------------+---------+-----------+----------+--------------------+ CFV     Full           Yes      Yes                                        +--------+---------------+---------+-----------+----------+--------------------+ SFJ     Full                                                              +--------+---------------+---------+-----------+----------+--------------------+ FV Prox Full           Yes      Yes                                       +--------+---------------+---------+-----------+----------+--------------------+ FV Mid  Full           Yes      Yes                                       +--------+---------------+---------+-----------+----------+--------------------+ FV      Full           Yes      Yes                                       Distal                                                                    +--------+---------------+---------+-----------+----------+--------------------+ PFV     Full  Not well visualized  +--------+---------------+---------+-----------+----------+--------------------+ POP                    Yes      Yes                  patent by                                                                 doppler/color        +--------+---------------+---------+-----------+----------+--------------------+ PTV     Full                                         Not well visualized  +--------+---------------+---------+-----------+----------+--------------------+ PERO    Full                                         Not well visualized  +--------+---------------+---------+-----------+----------+--------------------+ Unable to perform compression images on popliteal vein due to position and pain intolerance    Summary: BILATERAL: -No evidence of popliteal cyst, bilaterally. RIGHT: - There is no evidence of deep vein thrombosis in the lower extremity. However, portions of this examination were limited- see technologist comments above.  LEFT: - There is no evidence of deep vein thrombosis in the lower extremity.  However, portions of this examination were limited- see technologist comments above.  *See table(s) above for measurements and observations. Electronically signed by Sherald Hess MD on 10/18/2022 at 1:07:31 PM.    Final    DG CHEST PORT 1 VIEW  Result Date: 10/15/2022 CLINICAL DATA:  Provided history: Fever. EXAM: PORTABLE CHEST 1 VIEW COMPARISON:  Prior chest radiographs 03/28/2022 and earlier. FINDINGS: Shallow inspiration radiograph, accentuating the cardiac silhouette and limiting evaluation of heart size. Aortic atherosclerosis. Subtle prominence of the interstitial lung markings, greatest within the right upper lobe. Small linear opacities within the left lung base which may reflect atelectasis and/or scarring. No evidence of pleural effusion or pneumothorax. No acute osseous abnormality identified. Small to moderate-sized hiatal hernia. IMPRESSION: 1. Subtle prominence of the interstitial lung markings, greatest within the right upper lobe. This may reflect asymmetric edema or atypical/viral infection. 2. Small foci of linear atelectasis and/or scarring within the left lung base. 3.  Aortic Atherosclerosis (ICD10-I70.0). 4. Small to moderate-sized hiatal hernia. Electronically Signed   By: Jackey Loge D.O.   On: 10/15/2022 09:57   DG Ankle 2 Views Right  Result Date: 10/13/2022 CLINICAL DATA:  Ankle pain EXAM: RIGHT ANKLE - 2 VIEW COMPARISON:  None Available. FINDINGS: Diffuse osteopenia. No acute bony abnormality. Specifically, no fracture, subluxation, or dislocation. Calcaneal spur. Vascular calcifications. Soft tissues are intact. IMPRESSION: No acute bony abnormality. Electronically Signed   By: Charlett Nose M.D.   On: 10/13/2022 21:28   DG FEMUR, MIN 2 VIEWS RIGHT  Result Date: 10/13/2022 CLINICAL DATA:  Patient is showing signs of pain with any movement of the right leg. Patient is confused unable to give a good history. History of right femoral nail placement. EXAM: RIGHT KNEE -  COMPLETE 4+ VIEW; RIGHT FEMUR 2 VIEWS COMPARISON:  Radiographs dated April 05, 2020 FINDINGS: Right femur: Right femoral intramedullary nail with diffuse osteopenia. Compared to prior examination there is increased perihardware lucency about the proximal aspect of the femoral component concerning for loosening. Interval progression of the osseous remodeling about distal femoral supracondylar fracture. Diffuse osteopenia limits evaluation. Right knee: Osseous remodeling about the chronic supracondylar fracture of the right femur. Tricompartmental knee joint space narrowing and marginal osteophytes. No significant joint effusion. a Diffuse osteopenia. Vascular calcifications. IMPRESSION: Right femur: 1. Right femoral intramedullary nail with increased perihardware lucency about the proximal aspect of the femoral component concerning for loosening. Right knee: 1. Interval progression of the osseous remodeling about the chronic supracondylar fracture of the right femur. 2. Tricompartmental knee osteoarthritis. Electronically Signed   By: Larose Hires D.O.   On: 10/13/2022 15:57   DG Knee Complete 4 Views Right  Result Date: 10/13/2022 CLINICAL DATA:  Patient is showing signs of pain with any movement of the right leg. Patient is confused unable to give a good history. History of right femoral nail placement. EXAM: RIGHT KNEE - COMPLETE 4+ VIEW; RIGHT FEMUR 2 VIEWS COMPARISON:  Radiographs dated April 05, 2020 FINDINGS: Right femur: Right femoral intramedullary nail with diffuse osteopenia. Compared to prior examination there is increased perihardware lucency about the proximal aspect of the femoral component concerning for loosening. Interval progression of the osseous remodeling about distal femoral supracondylar fracture. Diffuse osteopenia limits evaluation. Right knee: Osseous remodeling about the chronic supracondylar fracture of the right femur. Tricompartmental knee joint space narrowing and marginal  osteophytes. No significant joint effusion. a Diffuse osteopenia. Vascular calcifications. IMPRESSION: Right femur: 1. Right femoral intramedullary nail with increased perihardware lucency about the proximal aspect of the femoral component concerning for loosening. Right knee: 1. Interval progression of the osseous remodeling about the chronic supracondylar fracture of the right femur. 2. Tricompartmental knee osteoarthritis. Electronically Signed   By: Larose Hires D.O.   On: 10/13/2022 15:57    Microbiology: Results for orders placed or performed during the hospital encounter of 10/13/22  Urine Culture     Status: Abnormal   Collection Time: 10/13/22  8:07 PM   Specimen: Urine, Catheterized  Result Value Ref Range Status   Specimen Description URINE, CATHETERIZED  Final   Special Requests   Final    NONE Performed at [facility], [address] [place], [place] [address]    Culture (A)  Final    >=100,000 COLONIES/mL KLEBSIELLA PNEUMONIAE 20,000 COLONIES/mL ENTEROCOCCUS FAECALIS    Report Status 10/16/2022 FINAL  Final   Organism ID, Bacteria [id number] PNEUMONIAE (A)  Final   Organism ID, Bacteria ENTEROCOCCUS FAECALIS (A)  Final      Susceptibility   Enterococcus faecalis - MIC*    AMPICILLIN <=2 SENSITIVE Sensitive     NITROFURANTOIN <=16 SENSITIVE Sensitive     VANCOMYCIN 1 SENSITIVE Sensitive     * 20,000 COLONIES/mL ENTEROCOCCUS FAECALIS   Klebsiella pneumoniae - MIC*    AMPICILLIN RESISTANT Resistant     CEFAZOLIN <=4 SENSITIVE Sensitive     CEFEPIME <=0.12 SENSITIVE Sensitive     CEFTRIAXONE <=0.25 SENSITIVE Sensitive     CIPROFLOXACIN <=0.25 SENSITIVE Sensitive     GENTAMICIN <=1 SENSITIVE Sensitive     IMIPENEM <=0.25 SENSITIVE Sensitive     NITROFURANTOIN 64 INTERMEDIATE Intermediate     TRIMETH/SULFA <=20 SENSITIVE Sensitive     AMPICILLIN/SULBACTAM 4 SENSITIVE Sensitive     PIP/TAZO <=4 SENSITIVE Sensitive     * >=100,000  COLONIES/mL KLEBSIELLA PNEUMONIAE   Culture, blood (Routine X 2) w Reflex to ID Panel     Status: None   Collection Time: 10/15/22  9:43 AM   Specimen: BLOOD LEFT ARM  Result Value Ref Range Status   Specimen Description BLOOD LEFT ARM  Final   Special Requests   Final    BOTTLES DRAWN AEROBIC ONLY Blood Culture adequate volume   Culture   Final    NO GROWTH 5 DAYS Performed at [facility], [address], [place], [place] [address]    Report Status 10/20/2022 FINAL  Final   Labs: CBC: Recent Labs  Lab 10/15/22 0522 10/16/22 0254 10/17/22 0508 10/21/22 0346  WBC 9.1 8.0 9.3 8.2  NEUTROABS 5.4 4.1 4.4  --   HGB 12.9 11.7* 11.7* 11.7*  HCT 39.5 36.8 37.0 38.7  MCV 86.1 88.5 88.9 90.8  PLT 144* 134* 161 245   Basic Metabolic Panel: Recent Labs  Lab 10/15/22 0522 10/16/22 0254 10/17/22 0508 10/18/22 0341 10/21/22 0346  NA 140 140 139 139 141  K 3.4* 4.0 4.0 4.2 4.4  CL 108 102 107 105 106  CO2 GLUCOSE 132* 138* 140* 98 119*  BUN 8 7* 6* 6* 8  CREATININE 0.75 0.92 0.83 0.74 0.78  CALCIUM 8.6* 8.3* 8.7* 8.8* 9.1  MG 1.5* 2.1  --  2.0 1.9  PHOS  --   --  3.1  --   --    Liver Function Tests: Recent Labs  Lab 10/15/22 0522  AST 15  ALT 18  ALKPHOS 58  BILITOT 0.9  PROT 6.1*  ALBUMIN 3.0*   CBG: Recent Labs  Lab 10/20/22 1757 10/20/22 2248 10/21/22 0012 10/21/22 0805 10/21/22 1147  GLUCAP 174* 123* 159* 102* 214*    Discharge time spent: greater than 30 minutes.  Signed: Lynden Oxford, MD [facility]

## 2022-10-21 NOTE — TOC Transition Note (Signed)
Transition of Care [facility]) - CM/SW Discharge Note   Patient Details  Name: [name] MRN: [id number] Date of Birth: 01/27/1946  [facility] of [facility]) CM/SW Contact:  Erin Sons, LCSW Phone Number: 10/21/2022, 4:08 PM   Clinical Narrative:     Patient will DC to: [facility] Anticipated DC date: 10/21/22 Family notified: Son Aurther Loft Transport by: Sharin Mons   Per MD patient ready for DC to [facility]. RN, patient, patient's family, and facility notified of DC. Discharge Summary and FL2 sent to facility. RN to call report prior to discharge [phone] room 113b). DC packet on chart. Ambulance transport requested for patient.   CSW will sign off for now as social work intervention is no longer needed. Please consult [place] again if new needs arise.   Final next level of care: Skilled Nursing Facility Barriers to Discharge: No Barriers Identified   Patient Goals and CMS Choice      Discharge Placement                Patient chooses bed at: [facility] Patient to be transferred to facility by: [facility] Name of family member notified: Son Aurther Loft Patient and family notified of of transfer: 10/21/22  Discharge Plan and Services Additional resources added to the After Visit Summary for                                       Social Determinants of Health (SDOH) Interventions SDOH Screenings   Food Insecurity: No Food Insecurity (10/13/2022)  Housing: Low Risk  (10/13/2022)  Transportation Needs: No Transportation Needs (10/13/2022)  Utilities: Not At Risk (10/13/2022)  Depression (PHQ2-9): Low Risk  (02/10/2022)  Tobacco Use: Medium Risk (03/28/2022)     Readmission Risk Interventions     No data to display

## 2022-10-21 NOTE — Discharge Instructions (Signed)

## 2022-10-21 NOTE — Progress Notes (Signed)
Orthopedic Tech Progress Note Patient Details:  [name] Jan 05, 1946 [id number]  Knee immobilizer placed to RLE by Bella Kennedy and Roseanne Reno.  Ortho Devices Type of Ortho Device: Knee Immobilizer Ortho Device/Splint Location: RLE Ortho Device/Splint Interventions: Ordered, Application, Adjustment   Post Interventions Patient Tolerated: Well Instructions Provided: Care of device  [name] Carmine Savoy 10/21/2022, 3:57 PM

## 2022-10-21 NOTE — Progress Notes (Signed)
Patient has refused wound care this morning.

## 2022-10-21 NOTE — TOC Progression Note (Signed)
Transition of Care [facility]) - Progression Note    Patient Details  Name: [name] MRN: [id number] Date of Birth: [birth year]  Transition of Care [facility]) CM/SW Contact  Erin Sons, [place] Phone Number: 10/21/2022, 2:59 PM  Clinical Narrative:     CSW spoke with son Aurther Loft. He is aware that [facility] can no longer accept pt. Alternatively he chooses [facility]. They can accept pt for LTC when medically ready.   Expected Discharge Plan: Skilled Nursing Facility Barriers to Discharge: Continued Medical Work up  Expected Discharge Plan and Services       Living arrangements for the past 2 months: Skilled Nursing Facility Expected Discharge Date: 10/21/22                                     Social Determinants of Health (SDOH) Interventions SDOH Screenings   Food Insecurity: No Food Insecurity (10/13/2022)  Housing: Low Risk  (10/13/2022)  Transportation Needs: No Transportation Needs (10/13/2022)  Utilities: Not At Risk (10/13/2022)  Depression (PHQ2-9): Low Risk  (02/10/2022)  Tobacco Use: Medium Risk (03/28/2022)    Readmission Risk Interventions     No data to display

## 2022-10-22 DIAGNOSIS — E119 Type 2 diabetes mellitus without complications: Secondary | ICD-10-CM | POA: Diagnosis not present

## 2022-10-25 DIAGNOSIS — Q2112 Patent foramen ovale: Secondary | ICD-10-CM | POA: Diagnosis not present

## 2022-10-25 DIAGNOSIS — S82141A Displaced bicondylar fracture of right tibia, initial encounter for closed fracture: Secondary | ICD-10-CM | POA: Diagnosis not present

## 2022-10-25 DIAGNOSIS — I639 Cerebral infarction, unspecified: Secondary | ICD-10-CM | POA: Diagnosis not present

## 2022-10-25 DIAGNOSIS — E119 Type 2 diabetes mellitus without complications: Secondary | ICD-10-CM | POA: Diagnosis not present

## 2022-10-25 DIAGNOSIS — G8929 Other chronic pain: Secondary | ICD-10-CM | POA: Diagnosis not present

## 2022-10-25 DIAGNOSIS — E785 Hyperlipidemia, unspecified: Secondary | ICD-10-CM | POA: Diagnosis not present

## 2022-10-25 DIAGNOSIS — G40909 Epilepsy, unspecified, not intractable, without status epilepticus: Secondary | ICD-10-CM | POA: Diagnosis not present

## 2022-10-25 DIAGNOSIS — I5032 Chronic diastolic (congestive) heart failure: Secondary | ICD-10-CM | POA: Diagnosis not present

## 2022-10-29 DIAGNOSIS — N390 Urinary tract infection, site not specified: Secondary | ICD-10-CM | POA: Diagnosis not present

## 2022-10-29 DIAGNOSIS — I639 Cerebral infarction, unspecified: Secondary | ICD-10-CM | POA: Diagnosis not present

## 2022-10-29 DIAGNOSIS — E119 Type 2 diabetes mellitus without complications: Secondary | ICD-10-CM | POA: Diagnosis not present

## 2022-10-29 DIAGNOSIS — I5032 Chronic diastolic (congestive) heart failure: Secondary | ICD-10-CM | POA: Diagnosis not present

## 2022-10-29 DIAGNOSIS — I69351 Hemiplegia and hemiparesis following cerebral infarction affecting right dominant side: Secondary | ICD-10-CM | POA: Diagnosis not present

## 2022-10-29 DIAGNOSIS — I10 Essential (primary) hypertension: Secondary | ICD-10-CM | POA: Diagnosis not present

## 2022-10-29 DIAGNOSIS — S82141A Displaced bicondylar fracture of right tibia, initial encounter for closed fracture: Secondary | ICD-10-CM | POA: Diagnosis not present

## 2022-10-29 DIAGNOSIS — M25561 Pain in right knee: Secondary | ICD-10-CM | POA: Diagnosis not present

## 2022-11-01 DIAGNOSIS — G629 Polyneuropathy, unspecified: Secondary | ICD-10-CM | POA: Diagnosis not present

## 2022-11-08 DIAGNOSIS — G8929 Other chronic pain: Secondary | ICD-10-CM | POA: Diagnosis not present

## 2022-11-08 DIAGNOSIS — G629 Polyneuropathy, unspecified: Secondary | ICD-10-CM | POA: Diagnosis not present

## 2022-11-15 DIAGNOSIS — G629 Polyneuropathy, unspecified: Secondary | ICD-10-CM | POA: Diagnosis not present

## 2022-11-15 DIAGNOSIS — G8929 Other chronic pain: Secondary | ICD-10-CM | POA: Diagnosis not present

## 2022-11-20 DIAGNOSIS — Z13228 Encounter for screening for other metabolic disorders: Secondary | ICD-10-CM | POA: Diagnosis not present

## 2022-11-20 DIAGNOSIS — I10 Essential (primary) hypertension: Secondary | ICD-10-CM | POA: Diagnosis not present

## 2022-11-20 DIAGNOSIS — E559 Vitamin D deficiency, unspecified: Secondary | ICD-10-CM | POA: Diagnosis not present

## 2022-11-22 DIAGNOSIS — Z743 Need for continuous supervision: Secondary | ICD-10-CM | POA: Diagnosis not present

## 2022-11-22 DIAGNOSIS — I639 Cerebral infarction, unspecified: Secondary | ICD-10-CM | POA: Diagnosis not present

## 2022-11-22 DIAGNOSIS — G40909 Epilepsy, unspecified, not intractable, without status epilepticus: Secondary | ICD-10-CM | POA: Diagnosis not present

## 2022-11-22 DIAGNOSIS — M25561 Pain in right knee: Secondary | ICD-10-CM | POA: Diagnosis not present

## 2022-11-22 DIAGNOSIS — K5900 Constipation, unspecified: Secondary | ICD-10-CM | POA: Diagnosis not present

## 2022-11-22 DIAGNOSIS — E785 Hyperlipidemia, unspecified: Secondary | ICD-10-CM | POA: Diagnosis not present

## 2022-11-22 DIAGNOSIS — Z7401 Bed confinement status: Secondary | ICD-10-CM | POA: Diagnosis not present

## 2022-11-22 DIAGNOSIS — E119 Type 2 diabetes mellitus without complications: Secondary | ICD-10-CM | POA: Diagnosis not present

## 2022-11-22 DIAGNOSIS — I480 Paroxysmal atrial fibrillation: Secondary | ICD-10-CM | POA: Diagnosis not present

## 2022-11-22 DIAGNOSIS — I10 Essential (primary) hypertension: Secondary | ICD-10-CM | POA: Diagnosis not present

## 2022-11-22 DIAGNOSIS — M898X5 Other specified disorders of bone, thigh: Secondary | ICD-10-CM | POA: Diagnosis not present

## 2022-11-22 DIAGNOSIS — R531 Weakness: Secondary | ICD-10-CM | POA: Diagnosis not present

## 2022-11-22 DIAGNOSIS — G8929 Other chronic pain: Secondary | ICD-10-CM | POA: Diagnosis not present

## 2022-12-07 DIAGNOSIS — E119 Type 2 diabetes mellitus without complications: Secondary | ICD-10-CM | POA: Diagnosis not present

## 2022-12-07 DIAGNOSIS — S81811A Laceration without foreign body, right lower leg, initial encounter: Secondary | ICD-10-CM | POA: Diagnosis not present

## 2022-12-14 DIAGNOSIS — S81811A Laceration without foreign body, right lower leg, initial encounter: Secondary | ICD-10-CM | POA: Diagnosis not present

## 2022-12-14 DIAGNOSIS — E119 Type 2 diabetes mellitus without complications: Secondary | ICD-10-CM | POA: Diagnosis not present

## 2022-12-21 DIAGNOSIS — S81811A Laceration without foreign body, right lower leg, initial encounter: Secondary | ICD-10-CM | POA: Diagnosis not present

## 2022-12-21 DIAGNOSIS — E119 Type 2 diabetes mellitus without complications: Secondary | ICD-10-CM | POA: Diagnosis not present

## 2022-12-22 DIAGNOSIS — G629 Polyneuropathy, unspecified: Secondary | ICD-10-CM | POA: Diagnosis not present

## 2022-12-22 DIAGNOSIS — I639 Cerebral infarction, unspecified: Secondary | ICD-10-CM | POA: Diagnosis not present

## 2022-12-22 DIAGNOSIS — I5032 Chronic diastolic (congestive) heart failure: Secondary | ICD-10-CM | POA: Diagnosis not present

## 2022-12-22 DIAGNOSIS — E785 Hyperlipidemia, unspecified: Secondary | ICD-10-CM | POA: Diagnosis not present

## 2022-12-22 DIAGNOSIS — I69351 Hemiplegia and hemiparesis following cerebral infarction affecting right dominant side: Secondary | ICD-10-CM | POA: Diagnosis not present

## 2022-12-22 DIAGNOSIS — I10 Essential (primary) hypertension: Secondary | ICD-10-CM | POA: Diagnosis not present

## 2022-12-22 DIAGNOSIS — F419 Anxiety disorder, unspecified: Secondary | ICD-10-CM | POA: Diagnosis not present

## 2022-12-22 DIAGNOSIS — S82141D Displaced bicondylar fracture of right tibia, subsequent encounter for closed fracture with routine healing: Secondary | ICD-10-CM | POA: Diagnosis not present

## 2022-12-28 DIAGNOSIS — E119 Type 2 diabetes mellitus without complications: Secondary | ICD-10-CM | POA: Diagnosis not present

## 2022-12-28 DIAGNOSIS — S81811A Laceration without foreign body, right lower leg, initial encounter: Secondary | ICD-10-CM | POA: Diagnosis not present

## 2023-01-03 DIAGNOSIS — G8929 Other chronic pain: Secondary | ICD-10-CM | POA: Diagnosis not present

## 2023-01-04 DIAGNOSIS — S81811A Laceration without foreign body, right lower leg, initial encounter: Secondary | ICD-10-CM | POA: Diagnosis not present

## 2023-01-04 DIAGNOSIS — E119 Type 2 diabetes mellitus without complications: Secondary | ICD-10-CM | POA: Diagnosis not present

## 2023-01-11 DIAGNOSIS — E119 Type 2 diabetes mellitus without complications: Secondary | ICD-10-CM | POA: Diagnosis not present

## 2023-01-11 DIAGNOSIS — S81811A Laceration without foreign body, right lower leg, initial encounter: Secondary | ICD-10-CM | POA: Diagnosis not present

## 2023-01-12 DIAGNOSIS — M25561 Pain in right knee: Secondary | ICD-10-CM | POA: Diagnosis not present

## 2023-01-13 DIAGNOSIS — E119 Type 2 diabetes mellitus without complications: Secondary | ICD-10-CM | POA: Diagnosis not present

## 2023-01-18 DIAGNOSIS — S81811A Laceration without foreign body, right lower leg, initial encounter: Secondary | ICD-10-CM | POA: Diagnosis not present

## 2023-01-18 DIAGNOSIS — E119 Type 2 diabetes mellitus without complications: Secondary | ICD-10-CM | POA: Diagnosis not present

## 2023-01-18 DIAGNOSIS — R278 Other lack of coordination: Secondary | ICD-10-CM | POA: Diagnosis not present

## 2023-01-20 DIAGNOSIS — E119 Type 2 diabetes mellitus without complications: Secondary | ICD-10-CM | POA: Diagnosis not present

## 2023-01-20 DIAGNOSIS — G40909 Epilepsy, unspecified, not intractable, without status epilepticus: Secondary | ICD-10-CM | POA: Diagnosis not present

## 2023-01-20 DIAGNOSIS — I639 Cerebral infarction, unspecified: Secondary | ICD-10-CM | POA: Diagnosis not present

## 2023-01-20 DIAGNOSIS — I10 Essential (primary) hypertension: Secondary | ICD-10-CM | POA: Diagnosis not present

## 2023-01-21 DIAGNOSIS — D649 Anemia, unspecified: Secondary | ICD-10-CM | POA: Diagnosis not present

## 2023-01-21 DIAGNOSIS — E559 Vitamin D deficiency, unspecified: Secondary | ICD-10-CM | POA: Diagnosis not present

## 2023-01-21 DIAGNOSIS — E119 Type 2 diabetes mellitus without complications: Secondary | ICD-10-CM | POA: Diagnosis not present

## 2023-01-24 DIAGNOSIS — E119 Type 2 diabetes mellitus without complications: Secondary | ICD-10-CM | POA: Diagnosis not present

## 2023-02-01 DIAGNOSIS — E119 Type 2 diabetes mellitus without complications: Secondary | ICD-10-CM | POA: Diagnosis not present

## 2023-02-01 DIAGNOSIS — S81811A Laceration without foreign body, right lower leg, initial encounter: Secondary | ICD-10-CM | POA: Diagnosis not present

## 2023-02-01 DIAGNOSIS — R918 Other nonspecific abnormal finding of lung field: Secondary | ICD-10-CM | POA: Diagnosis not present

## 2023-02-03 DIAGNOSIS — J690 Pneumonitis due to inhalation of food and vomit: Secondary | ICD-10-CM | POA: Diagnosis not present

## 2023-02-03 DIAGNOSIS — R1312 Dysphagia, oropharyngeal phase: Secondary | ICD-10-CM | POA: Diagnosis not present

## 2023-02-03 DIAGNOSIS — R1311 Dysphagia, oral phase: Secondary | ICD-10-CM | POA: Diagnosis not present

## 2023-02-08 DIAGNOSIS — S81811A Laceration without foreign body, right lower leg, initial encounter: Secondary | ICD-10-CM | POA: Diagnosis not present

## 2023-02-08 DIAGNOSIS — E119 Type 2 diabetes mellitus without complications: Secondary | ICD-10-CM | POA: Diagnosis not present

## 2023-02-11 DIAGNOSIS — J189 Pneumonia, unspecified organism: Secondary | ICD-10-CM | POA: Diagnosis not present

## 2023-02-11 DIAGNOSIS — L97919 Non-pressure chronic ulcer of unspecified part of right lower leg with unspecified severity: Secondary | ICD-10-CM | POA: Diagnosis not present

## 2023-02-14 DIAGNOSIS — J690 Pneumonitis due to inhalation of food and vomit: Secondary | ICD-10-CM | POA: Diagnosis not present

## 2023-02-15 DIAGNOSIS — S81811A Laceration without foreign body, right lower leg, initial encounter: Secondary | ICD-10-CM | POA: Diagnosis not present

## 2023-02-15 DIAGNOSIS — E119 Type 2 diabetes mellitus without complications: Secondary | ICD-10-CM | POA: Diagnosis not present

## 2023-02-19 DIAGNOSIS — R051 Acute cough: Secondary | ICD-10-CM | POA: Diagnosis not present

## 2023-02-21 DIAGNOSIS — I10 Essential (primary) hypertension: Secondary | ICD-10-CM | POA: Diagnosis not present

## 2023-02-22 DIAGNOSIS — S81811A Laceration without foreign body, right lower leg, initial encounter: Secondary | ICD-10-CM | POA: Diagnosis not present

## 2023-02-22 DIAGNOSIS — U071 COVID-19: Secondary | ICD-10-CM | POA: Diagnosis not present

## 2023-02-22 DIAGNOSIS — E119 Type 2 diabetes mellitus without complications: Secondary | ICD-10-CM | POA: Diagnosis not present

## 2023-02-25 DIAGNOSIS — R1311 Dysphagia, oral phase: Secondary | ICD-10-CM | POA: Diagnosis not present

## 2023-02-25 DIAGNOSIS — R1312 Dysphagia, oropharyngeal phase: Secondary | ICD-10-CM | POA: Diagnosis not present

## 2023-03-01 DIAGNOSIS — S81811A Laceration without foreign body, right lower leg, initial encounter: Secondary | ICD-10-CM | POA: Diagnosis not present

## 2023-03-01 DIAGNOSIS — E119 Type 2 diabetes mellitus without complications: Secondary | ICD-10-CM | POA: Diagnosis not present

## 2023-03-03 DIAGNOSIS — S81811A Laceration without foreign body, right lower leg, initial encounter: Secondary | ICD-10-CM | POA: Diagnosis not present

## 2023-03-03 DIAGNOSIS — R1312 Dysphagia, oropharyngeal phase: Secondary | ICD-10-CM | POA: Diagnosis not present

## 2023-03-03 DIAGNOSIS — R1311 Dysphagia, oral phase: Secondary | ICD-10-CM | POA: Diagnosis not present

## 2023-03-04 DIAGNOSIS — R1312 Dysphagia, oropharyngeal phase: Secondary | ICD-10-CM | POA: Diagnosis not present

## 2023-03-04 DIAGNOSIS — R1311 Dysphagia, oral phase: Secondary | ICD-10-CM | POA: Diagnosis not present

## 2023-03-07 DIAGNOSIS — R1312 Dysphagia, oropharyngeal phase: Secondary | ICD-10-CM | POA: Diagnosis not present

## 2023-03-08 DIAGNOSIS — R1312 Dysphagia, oropharyngeal phase: Secondary | ICD-10-CM | POA: Diagnosis not present

## 2023-03-08 DIAGNOSIS — S81811A Laceration without foreign body, right lower leg, initial encounter: Secondary | ICD-10-CM | POA: Diagnosis not present

## 2023-03-08 DIAGNOSIS — E119 Type 2 diabetes mellitus without complications: Secondary | ICD-10-CM | POA: Diagnosis not present

## 2023-03-09 DIAGNOSIS — R1312 Dysphagia, oropharyngeal phase: Secondary | ICD-10-CM | POA: Diagnosis not present

## 2023-03-10 DIAGNOSIS — R1312 Dysphagia, oropharyngeal phase: Secondary | ICD-10-CM | POA: Diagnosis not present

## 2023-03-10 DIAGNOSIS — S81811A Laceration without foreign body, right lower leg, initial encounter: Secondary | ICD-10-CM | POA: Diagnosis not present

## 2023-03-11 DIAGNOSIS — R1312 Dysphagia, oropharyngeal phase: Secondary | ICD-10-CM | POA: Diagnosis not present

## 2023-03-14 DIAGNOSIS — R1312 Dysphagia, oropharyngeal phase: Secondary | ICD-10-CM | POA: Diagnosis not present

## 2023-03-15 DIAGNOSIS — R1312 Dysphagia, oropharyngeal phase: Secondary | ICD-10-CM | POA: Diagnosis not present

## 2023-03-16 DIAGNOSIS — R1312 Dysphagia, oropharyngeal phase: Secondary | ICD-10-CM | POA: Diagnosis not present

## 2023-03-17 DIAGNOSIS — R1312 Dysphagia, oropharyngeal phase: Secondary | ICD-10-CM | POA: Diagnosis not present

## 2023-03-18 DIAGNOSIS — R1312 Dysphagia, oropharyngeal phase: Secondary | ICD-10-CM | POA: Diagnosis not present

## 2023-03-22 DIAGNOSIS — G40909 Epilepsy, unspecified, not intractable, without status epilepticus: Secondary | ICD-10-CM | POA: Diagnosis not present

## 2023-03-22 DIAGNOSIS — I480 Paroxysmal atrial fibrillation: Secondary | ICD-10-CM | POA: Diagnosis not present

## 2023-03-22 DIAGNOSIS — I639 Cerebral infarction, unspecified: Secondary | ICD-10-CM | POA: Diagnosis not present

## 2023-03-22 DIAGNOSIS — I10 Essential (primary) hypertension: Secondary | ICD-10-CM | POA: Diagnosis not present

## 2023-03-22 DIAGNOSIS — I69351 Hemiplegia and hemiparesis following cerebral infarction affecting right dominant side: Secondary | ICD-10-CM | POA: Diagnosis not present

## 2023-03-22 DIAGNOSIS — Z794 Long term (current) use of insulin: Secondary | ICD-10-CM | POA: Diagnosis not present

## 2023-03-22 DIAGNOSIS — E785 Hyperlipidemia, unspecified: Secondary | ICD-10-CM | POA: Diagnosis not present

## 2023-03-22 DIAGNOSIS — E119 Type 2 diabetes mellitus without complications: Secondary | ICD-10-CM | POA: Diagnosis not present

## 2023-03-25 DIAGNOSIS — L97919 Non-pressure chronic ulcer of unspecified part of right lower leg with unspecified severity: Secondary | ICD-10-CM | POA: Diagnosis not present

## 2023-03-29 DIAGNOSIS — E119 Type 2 diabetes mellitus without complications: Secondary | ICD-10-CM | POA: Diagnosis not present

## 2023-03-29 DIAGNOSIS — S81811A Laceration without foreign body, right lower leg, initial encounter: Secondary | ICD-10-CM | POA: Diagnosis not present

## 2023-03-31 DIAGNOSIS — S81811A Laceration without foreign body, right lower leg, initial encounter: Secondary | ICD-10-CM | POA: Diagnosis not present

## 2023-04-05 DIAGNOSIS — S80811D Abrasion, right lower leg, subsequent encounter: Secondary | ICD-10-CM | POA: Diagnosis not present

## 2023-04-05 DIAGNOSIS — E119 Type 2 diabetes mellitus without complications: Secondary | ICD-10-CM | POA: Diagnosis not present

## 2023-04-27 DIAGNOSIS — L602 Onychogryphosis: Secondary | ICD-10-CM | POA: Diagnosis not present

## 2023-04-27 DIAGNOSIS — L84 Corns and callosities: Secondary | ICD-10-CM | POA: Diagnosis not present

## 2023-04-27 DIAGNOSIS — Z794 Long term (current) use of insulin: Secondary | ICD-10-CM | POA: Diagnosis not present

## 2023-04-27 DIAGNOSIS — L603 Nail dystrophy: Secondary | ICD-10-CM | POA: Diagnosis not present

## 2023-04-27 DIAGNOSIS — E1151 Type 2 diabetes mellitus with diabetic peripheral angiopathy without gangrene: Secondary | ICD-10-CM | POA: Diagnosis not present

## 2023-04-28 DIAGNOSIS — E119 Type 2 diabetes mellitus without complications: Secondary | ICD-10-CM | POA: Diagnosis not present

## 2023-05-02 DIAGNOSIS — E119 Type 2 diabetes mellitus without complications: Secondary | ICD-10-CM | POA: Diagnosis not present

## 2023-05-16 DIAGNOSIS — R443 Hallucinations, unspecified: Secondary | ICD-10-CM | POA: Diagnosis not present

## 2023-05-16 DIAGNOSIS — B370 Candidal stomatitis: Secondary | ICD-10-CM | POA: Diagnosis not present

## 2023-05-16 DIAGNOSIS — R410 Disorientation, unspecified: Secondary | ICD-10-CM | POA: Diagnosis not present

## 2023-05-17 DIAGNOSIS — E119 Type 2 diabetes mellitus without complications: Secondary | ICD-10-CM | POA: Diagnosis not present

## 2023-05-17 DIAGNOSIS — S81812A Laceration without foreign body, left lower leg, initial encounter: Secondary | ICD-10-CM | POA: Diagnosis not present

## 2023-05-17 DIAGNOSIS — I10 Essential (primary) hypertension: Secondary | ICD-10-CM | POA: Diagnosis not present

## 2023-05-17 DIAGNOSIS — R059 Cough, unspecified: Secondary | ICD-10-CM | POA: Diagnosis not present

## 2023-05-18 DIAGNOSIS — N390 Urinary tract infection, site not specified: Secondary | ICD-10-CM | POA: Diagnosis not present

## 2023-05-19 DIAGNOSIS — S81812A Laceration without foreign body, left lower leg, initial encounter: Secondary | ICD-10-CM | POA: Diagnosis not present

## 2023-05-20 DIAGNOSIS — R8290 Unspecified abnormal findings in urine: Secondary | ICD-10-CM | POA: Diagnosis not present

## 2023-05-23 DIAGNOSIS — Z13220 Encounter for screening for lipoid disorders: Secondary | ICD-10-CM | POA: Diagnosis not present

## 2023-05-24 DIAGNOSIS — S81812A Laceration without foreign body, left lower leg, initial encounter: Secondary | ICD-10-CM | POA: Diagnosis not present

## 2023-05-24 DIAGNOSIS — E119 Type 2 diabetes mellitus without complications: Secondary | ICD-10-CM | POA: Diagnosis not present

## 2023-05-24 DIAGNOSIS — R410 Disorientation, unspecified: Secondary | ICD-10-CM | POA: Diagnosis not present

## 2023-05-24 DIAGNOSIS — N390 Urinary tract infection, site not specified: Secondary | ICD-10-CM | POA: Diagnosis not present

## 2023-05-26 DIAGNOSIS — E119 Type 2 diabetes mellitus without complications: Secondary | ICD-10-CM | POA: Diagnosis not present

## 2023-05-26 DIAGNOSIS — R634 Abnormal weight loss: Secondary | ICD-10-CM | POA: Diagnosis not present

## 2023-05-26 DIAGNOSIS — I480 Paroxysmal atrial fibrillation: Secondary | ICD-10-CM | POA: Diagnosis not present

## 2023-05-26 DIAGNOSIS — G40909 Epilepsy, unspecified, not intractable, without status epilepticus: Secondary | ICD-10-CM | POA: Diagnosis not present

## 2023-05-30 DIAGNOSIS — Z4801 Encounter for change or removal of surgical wound dressing: Secondary | ICD-10-CM | POA: Diagnosis not present

## 2023-05-31 DIAGNOSIS — S81812A Laceration without foreign body, left lower leg, initial encounter: Secondary | ICD-10-CM | POA: Diagnosis not present

## 2023-05-31 DIAGNOSIS — E119 Type 2 diabetes mellitus without complications: Secondary | ICD-10-CM | POA: Diagnosis not present

## 2023-06-03 DIAGNOSIS — S81812A Laceration without foreign body, left lower leg, initial encounter: Secondary | ICD-10-CM | POA: Diagnosis not present

## 2023-07-20 DIAGNOSIS — I5032 Chronic diastolic (congestive) heart failure: Secondary | ICD-10-CM | POA: Insufficient documentation

## 2023-07-20 DIAGNOSIS — I480 Paroxysmal atrial fibrillation: Secondary | ICD-10-CM | POA: Insufficient documentation

## 2023-08-17 ENCOUNTER — Encounter (INDEPENDENT_AMBULATORY_CARE_PROVIDER_SITE_OTHER): Payer: Medicare Other

## 2023-08-17 ENCOUNTER — Encounter (INDEPENDENT_AMBULATORY_CARE_PROVIDER_SITE_OTHER): Payer: Medicare Other | Admitting: Nurse Practitioner

## 2023-09-05 ENCOUNTER — Other Ambulatory Visit (INDEPENDENT_AMBULATORY_CARE_PROVIDER_SITE_OTHER): Payer: Self-pay | Admitting: Nurse Practitioner

## 2023-09-05 DIAGNOSIS — R0989 Other specified symptoms and signs involving the circulatory and respiratory systems: Secondary | ICD-10-CM

## 2023-09-07 ENCOUNTER — Encounter (INDEPENDENT_AMBULATORY_CARE_PROVIDER_SITE_OTHER): Payer: Medicare Other | Admitting: Nurse Practitioner

## 2023-09-07 ENCOUNTER — Encounter (INDEPENDENT_AMBULATORY_CARE_PROVIDER_SITE_OTHER): Payer: Medicare Other

## 2023-10-19 ENCOUNTER — Ambulatory Visit (INDEPENDENT_AMBULATORY_CARE_PROVIDER_SITE_OTHER): Admitting: Nurse Practitioner

## 2023-10-19 ENCOUNTER — Ambulatory Visit (INDEPENDENT_AMBULATORY_CARE_PROVIDER_SITE_OTHER)

## 2023-10-19 ENCOUNTER — Encounter (INDEPENDENT_AMBULATORY_CARE_PROVIDER_SITE_OTHER): Payer: Self-pay | Admitting: Nurse Practitioner

## 2023-10-19 VITALS — BP 140/82 | HR 64 | Resp 16

## 2023-10-19 DIAGNOSIS — S81801A Unspecified open wound, right lower leg, initial encounter: Secondary | ICD-10-CM

## 2023-10-19 DIAGNOSIS — I10 Essential (primary) hypertension: Secondary | ICD-10-CM | POA: Diagnosis not present

## 2023-10-19 DIAGNOSIS — I739 Peripheral vascular disease, unspecified: Secondary | ICD-10-CM | POA: Diagnosis not present

## 2023-10-19 DIAGNOSIS — R0989 Other specified symptoms and signs involving the circulatory and respiratory systems: Secondary | ICD-10-CM | POA: Diagnosis not present

## 2023-10-19 NOTE — Progress Notes (Signed)
 Subjective:    Patient ID: [name], female    DOB: 01-Jul-1946, 78 y.o.   MRN: [id number] Chief Complaint  Patient presents with   New Patient (Initial Visit)    Ref Simpson-Tarokh consult recurrent skin tear to calf/absent pedal pulses    The patient is a 78 year old female who presents today from her nursing facility in regards to a recurrent skin tear on her right lower extremity.  The patient previously had a stroke and her right side has been affected and weaker.  The patient's son, provides much of the history today, and he notes that the skin tear will heal but then will recur shortly thereafter.  He notes that the right leg is weak and it is suspected that this happens due to issues with transferring and things of that nature.  This has been cyclical for months.  The patient is nonambulatory send she does not have any claudication-like symptoms but there is no description of rest pain.  Today she had noncompressible ABIs bilaterally.  However she had a TBI of 1 on the right and 0.95 on the left.  She has strong toe waveforms bilaterally.  She had multiphasic waveforms in the right lower extremity.    Review of Systems  Skin:  Positive for wound.  Neurological:  Positive for weakness.  All other systems reviewed and are negative.      Objective:   Physical Exam Vitals reviewed.  HENT:     Head: Normocephalic.  Cardiovascular:     Rate and Rhythm: Normal rate.     Pulses:          Dorsalis pedis pulses are detected w/ Doppler on the right side and detected w/ Doppler on the left side.       Posterior tibial pulses are detected w/ Doppler on the right side and detected w/ Doppler on the left side.  Pulmonary:     Effort: Pulmonary effort is normal.  Musculoskeletal:     Right lower leg: Edema present.     Left lower leg: Edema present.  Skin:    General: Skin is warm and dry.  Neurological:     Mental Status: She is alert and oriented to person, place, and time.   Psychiatric:        Mood and Affect: Mood normal.        Behavior: Behavior normal.        Thought Content: Thought content normal.        Judgment: Judgment normal.     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 On insulin   Enchondroma of bone 2011   left femur.    Fracture, intertrochanteric, right femur (HCC)    History of lower GI bleeding    Hyperlipidemia    Hypertension    Patent foramen ovale 04/2015   Started on warfarin 04/2015   Protein calorie malnutrition (HCC)    Stercoral ulcer of rectum 05/16/2015    Social History   Socioeconomic History   Marital status: Married  Spouse name: Not on file   Number of children: 6   Years of education: 90   Highest education level: Not on file  Occupational History   Occupation: Retired  Tobacco Use   Smoking status: Former    Current packs/day: 0.00    Types: Cigarettes    Quit date: 04/05/2015    Years since quitting: 8.5   Smokeless tobacco: Never   Tobacco comments:    Quit 04/28/15  Vaping Use   Vaping status: Never Used  Substance and Sexual Activity   Alcohol use: No    Alcohol/week: 0.0 standard drinks of alcohol   Drug use: No   Sexual activity: Not on file  Other Topics Concern   Not on file  Social History Narrative   She will be living with her two sons.\   Right-handed.   No caffeine use.   Social Drivers of Corporate investment banker Strain: Not on file  Food Insecurity: No Food Insecurity (10/13/2022)   Hunger Vital Sign    Worried About Running Out of Food in the Last Year: Never true    Ran Out of Food in the Last  Year: Never true  Transportation Needs: No Transportation Needs (10/13/2022)   PRAPARE - Administrator, Civil Service (Medical): No    Lack of Transportation (Non-Medical): No  Physical Activity: Not on file  Stress: Not on file  Social Connections: Not on file  Intimate Partner Violence: Not At Risk (10/13/2022)   Humiliation, Afraid, Rape, and Kick questionnaire    Fear of Current or Ex-Partner: No    Emotionally Abused: No    Physically Abused: No    Sexually Abused: No    Past Surgical History:  Procedure Laterality Date   CARDIAC SURGERY     Cath without stent   COLONOSCOPY N/A 05/15/2015   Procedure: COLONOSCOPY;  Surgeon: Sergio Dandy, MD;  Location: [facility] ENDOSCOPY;  Service: Endoscopy;  Laterality: N/A;   FLEXIBLE SIGMOIDOSCOPY N/A 05/15/2015   Procedure: FLEXIBLE SIGMOIDOSCOPY;  Surgeon: Sergio Dandy, MD;  Location: [facility] ENDOSCOPY;  Service: Endoscopy;  Laterality: N/A;  at bedside   INTRAMEDULLARY (IM) NAIL INTERTROCHANTERIC Right 06/12/2015   Procedure: INTRAMEDULLARY (IM) NAIL RIGHT HIP;  Surgeon: Saundra Curl, MD;  Location: [facility] OR;  Service: Orthopedics;  Laterality: Right;   TEE WITHOUT CARDIOVERSION N/A 05/05/2015   Procedure: TRANSESOPHAGEAL ECHOCARDIOGRAM (TEE);  Surgeon: Pasqual Bone, MD;  Location: [facility] ENDOSCOPY;  Service: Cardiovascular;  Laterality: N/A;    Family History  Problem Relation Age of Onset   Hypertension Mother    Heart failure Mother    Hyperlipidemia Mother    Heart attack Father     Allergies  Allergen Reactions   Pork-Derived Products Other (See Comments)    To keep blood pressure down       Latest Ref Rng & Units 10/21/2022    3:46 AM 10/17/2022    5:08 AM 10/16/2022    2:54 AM  CBC  WBC 4.0 - 10.5 K/uL 8.2  9.3  8.0   Hemoglobin 12.0 - 15.0 g/dL 16.1  09.6  04.5   Hematocrit 36.0 - 46.0 % 38.7  37.0  36.8   Platelets 150 - 400 K/uL 245  161  134       CMP     Component Value Date/Time   NA 141  10/21/2022 0346   NA 140 06/18/2015 0000   K 4.4 10/21/2022 0346   CL 106 10/21/2022 0346  CO2 25 10/21/2022 0346   GLUCOSE 119 (H) 10/21/2022 0346   BUN 8 10/21/2022 0346   BUN 12 06/18/2015 0000   CREATININE 0.78 10/21/2022 0346   CALCIUM 9.1 10/21/2022 0346   PROT 6.1 (L) 10/15/2022 0522   ALBUMIN 3.0 (L) 10/15/2022 0522   AST 15 10/15/2022 0522   ALT 18 10/15/2022 0522   ALKPHOS 58 10/15/2022 0522   BILITOT 0.9 10/15/2022 0522   GFR 72.11 02/10/2022 1422   GFRNONAA >60 10/21/2022 0346     No results found.     Assessment & Plan:   1. PAD (peripheral artery disease) (HCC) (Primary) Today noninvasive studies indicate that the patient should have adequate perfusion for wound healing.  However because there is some evidence of PAD I think that following closely until wound care notes some notable changes are safest course of action.  Patient return in 3 months with noninvasive studies.  2. Wound of right lower extremity, initial encounter The patient's family has requested a referral to wound care for treatment of her ongoing wound.  I think that because this has been a recurrent wound this is actually a good idea.  Because the wound has healed previously I do not necessarily think that is an issue with adequate perfusion for wound healing but rather reaggravation of the injury.  We will place referral to wound care as noted.  3. Primary hypertension Continue antihypertensive medications as already ordered, these medications have been reviewed and there are no changes at this time.   Current Outpatient Medications on File Prior to Visit  Medication Sig Dispense Refill   acetaminophen (TYLENOL) 325 MG tablet Take 650 mg by mouth every 6 (six) hours as needed.     atorvastatin (LIPITOR) 40 MG tablet Take 0.5 tablets (20 mg total) by mouth daily.     Cholecalciferol (VITAMIN D3) 50 MCG (2000 UT) TABS Take 1 tablet by mouth daily.     diclofenac Sodium (VOLTAREN) 1 % GEL Apply 4  g topically 4 (four) times daily. 100 g 0   ELIQUIS 5 MG TABS tablet Take 5 mg by mouth 2 (two) times daily.      insulin lispro (HUMALOG) 100 UNIT/ML injection Inject 4 Units into the skin once.     levETIRAcetam (KEPPRA) 500 MG tablet Take 1 tablet (500 mg total) by mouth 2 (two) times daily. 20 tablet 0   losartan (COZAAR) 25 MG tablet Take 12.5 mg by mouth daily.      metoprolol succinate (TOPROL-XL) 25 MG 24 hr tablet Take 25 mg by mouth daily.      polyethylene glycol (MIRALAX / GLYCOLAX) 17 g packet Take 17 g by mouth daily as needed for mild constipation or moderate constipation. Use Miralax if Milk of Magnesia is contraindicated (Example: Dialysis resident)     TRESIBA FLEXTOUCH 100 UNIT/ML FlexTouch Pen Inject 10 Units into the skin at bedtime. (Patient taking differently: Inject 5 Units into the skin at bedtime.) 15 mL 0   gabapentin (NEURONTIN) 300 MG capsule Take 1 capsule (300 mg total) by mouth at bedtime. (Patient not taking: Reported on 10/19/2023) 30 capsule 0   metFORMIN (GLUCOPHAGE-XR) 750 MG 24 hr tablet Take 1 tablet (750 mg total) by mouth daily with breakfast. (Patient not taking: Reported on 10/19/2023) 90 tablet 3   methocarbamol (ROBAXIN) 500 MG tablet Take 1 tablet (500 mg total) by mouth every 8 (eight) hours as needed for muscle spasms. (Patient not taking: Reported on 10/19/2023) 30 tablet 0  nystatin cream (MYCOSTATIN) Apply 1 Application topically See admin instructions. Apply topically 2 times a day for cutaneous or mucocutaneous candida rash of abdomen folds, groin and under breasts for 21 days. D/C order if rash is healed after 21 days. **Update MD if rash not resolved at that time** (Patient not taking: Reported on 10/19/2023)     oxyCODONE (OXY IR/ROXICODONE) 5 MG immediate release tablet Take 1 tablet (5 mg total) by mouth every 6 (six) hours as needed for severe pain or moderate pain. (Patient not taking: Reported on 10/19/2023) 20 tablet 0   Phenylephrine-DM-GG  (ROBITUSSIN COUGH/COLD CF PO) Take 10 mLs by mouth every 4 (four) hours as needed (Cough). **Notify MD if cough persists, congestion x3 days or accompanied by fever** (Patient not taking: Reported on 10/19/2023)     No current facility-administered medications on file prior to visit.    There are no Patient Instructions on file for this visit. No follow-ups on file.   Makenleigh Crownover E Alajah Witman, NP

## 2023-10-24 LAB — VAS US ABI WITH/WO TBI

## 2023-11-09 ENCOUNTER — Encounter: Attending: Physician Assistant | Admitting: Physician Assistant

## 2023-11-09 DIAGNOSIS — I5042 Chronic combined systolic (congestive) and diastolic (congestive) heart failure: Secondary | ICD-10-CM | POA: Diagnosis not present

## 2023-11-09 DIAGNOSIS — I69353 Hemiplegia and hemiparesis following cerebral infarction affecting right non-dominant side: Secondary | ICD-10-CM | POA: Diagnosis not present

## 2023-11-09 DIAGNOSIS — E11622 Type 2 diabetes mellitus with other skin ulcer: Secondary | ICD-10-CM | POA: Diagnosis present

## 2023-11-09 DIAGNOSIS — I110 Hypertensive heart disease with heart failure: Secondary | ICD-10-CM | POA: Insufficient documentation

## 2023-11-09 DIAGNOSIS — L97812 Non-pressure chronic ulcer of other part of right lower leg with fat layer exposed: Secondary | ICD-10-CM | POA: Insufficient documentation

## 2023-11-22 ENCOUNTER — Encounter (INDEPENDENT_AMBULATORY_CARE_PROVIDER_SITE_OTHER): Payer: Self-pay

## 2023-11-23 ENCOUNTER — Encounter: Admitting: Physician Assistant

## 2023-11-23 DIAGNOSIS — E11622 Type 2 diabetes mellitus with other skin ulcer: Secondary | ICD-10-CM | POA: Diagnosis not present

## 2023-12-07 ENCOUNTER — Encounter: Attending: Physician Assistant | Admitting: Physician Assistant

## 2023-12-07 DIAGNOSIS — I69353 Hemiplegia and hemiparesis following cerebral infarction affecting right non-dominant side: Secondary | ICD-10-CM | POA: Insufficient documentation

## 2023-12-07 DIAGNOSIS — E11622 Type 2 diabetes mellitus with other skin ulcer: Secondary | ICD-10-CM | POA: Diagnosis present

## 2023-12-07 DIAGNOSIS — I5042 Chronic combined systolic (congestive) and diastolic (congestive) heart failure: Secondary | ICD-10-CM | POA: Insufficient documentation

## 2023-12-07 DIAGNOSIS — L97812 Non-pressure chronic ulcer of other part of right lower leg with fat layer exposed: Secondary | ICD-10-CM | POA: Diagnosis not present

## 2023-12-07 DIAGNOSIS — I110 Hypertensive heart disease with heart failure: Secondary | ICD-10-CM | POA: Diagnosis not present

## 2023-12-21 ENCOUNTER — Encounter: Admitting: Internal Medicine

## 2023-12-21 DIAGNOSIS — E11622 Type 2 diabetes mellitus with other skin ulcer: Secondary | ICD-10-CM | POA: Diagnosis not present

## 2024-01-04 ENCOUNTER — Ambulatory Visit: Admitting: Physician Assistant

## 2024-01-06 ENCOUNTER — Encounter: Payer: Self-pay | Admitting: Emergency Medicine

## 2024-01-06 ENCOUNTER — Other Ambulatory Visit: Payer: Self-pay

## 2024-01-06 ENCOUNTER — Emergency Department
Admission: EM | Admit: 2024-01-06 | Discharge: 2024-01-07 | Disposition: A | Discharge status: Home / Self Care | Source: Skilled Nursing Facility | Attending: Emergency Medicine | Admitting: Emergency Medicine

## 2024-01-06 DIAGNOSIS — N390 Urinary tract infection, site not specified: Secondary | ICD-10-CM | POA: Insufficient documentation

## 2024-01-06 DIAGNOSIS — R339 Retention of urine, unspecified: Secondary | ICD-10-CM

## 2024-01-06 LAB — CBC WITH DIFFERENTIAL/PLATELET
Abs Immature Granulocytes: 0.03 K/uL (ref 0.00–0.07)
Basophils Absolute: 0.1 K/uL (ref 0.0–0.1)
Basophils Relative: 1 %
Eosinophils Absolute: 0.3 K/uL (ref 0.0–0.5)
Eosinophils Relative: 3 %
HCT: 44 % (ref 36.0–46.0)
Hemoglobin: 13.5 g/dL (ref 12.0–15.0)
Immature Granulocytes: 0 %
Lymphocytes Relative: 36 %
Lymphs Abs: 3.3 K/uL (ref 0.7–4.0)
MCH: 27.6 pg (ref 26.0–34.0)
MCHC: 30.7 g/dL (ref 30.0–36.0)
MCV: 90 fL (ref 80.0–100.0)
Monocytes Absolute: 0.7 K/uL (ref 0.1–1.0)
Monocytes Relative: 7 %
Neutro Abs: 4.8 K/uL (ref 1.7–7.7)
Neutrophils Relative %: 53 %
Platelets: 204 K/uL (ref 150–400)
RBC: 4.89 MIL/uL (ref 3.87–5.11)
RDW: 12.8 % (ref 11.5–15.5)
WBC: 9.2 K/uL (ref 4.0–10.5)
nRBC: 0 % (ref 0.0–0.2)

## 2024-01-06 LAB — COMPREHENSIVE METABOLIC PANEL WITH GFR
ALT: 17 U/L (ref 0–44)
AST: 28 U/L (ref 15–41)
Albumin: 3.3 g/dL — ABNORMAL LOW (ref 3.5–5.0)
Alkaline Phosphatase: 67 U/L (ref 38–126)
Anion gap: 10 (ref 5–15)
BUN: 8 mg/dL (ref 8–23)
CO2: 25 mmol/L (ref 22–32)
Calcium: 9 mg/dL (ref 8.9–10.3)
Chloride: 107 mmol/L (ref 98–111)
Creatinine, Ser: 0.66 mg/dL (ref 0.44–1.00)
GFR, Estimated: >60 mL/min (ref >60)
Glucose, Bld: 136 mg/dL — ABNORMAL HIGH (ref 70–99)
Potassium: 4.2 mmol/L (ref 3.5–5.1)
Sodium: 142 mmol/L (ref 135–145)
Total Bilirubin: 1.1 mg/dL (ref 0.0–1.2)
Total Protein: 6.3 g/dL — ABNORMAL LOW (ref 6.5–8.1)

## 2024-01-06 LAB — URINALYSIS, ROUTINE W REFLEX MICROSCOPIC
Bilirubin Urine: NEGATIVE
Glucose, UA: >=500 mg/dL — AB
Ketones, ur: NEGATIVE mg/dL
Nitrite: NEGATIVE
Protein, ur: 30 mg/dL — AB
Specific Gravity, Urine: 1.009 (ref 1.005–1.030)
WBC, UA: >50 WBC/hpf (ref 0–5)
pH: 6 (ref 5.0–8.0)

## 2024-01-06 LAB — LIPASE, BLOOD: Lipase: 38 U/L (ref 11–51)

## 2024-01-06 MED ORDER — CEPHALEXIN 500 MG PO CAPS: 500.0000 mg | ORAL_CAPSULE | Freq: Three times a day (TID) | ORAL | 0 refills | Status: DC

## 2024-01-06 MED ORDER — SODIUM CHLORIDE 0.9 % IV SOLN
1.0000 g | Freq: Once | INTRAVENOUS | Status: AC
  Administered 2024-01-06: 1 g via INTRAVENOUS
  Filled 2024-01-06: qty 10

## 2024-01-06 NOTE — ED Triage Notes (Signed)
 Pt to ER via EMS from [facility] with c/o not urinating today, abdominal pain and sleeping more than normal. Bladder scan for .  Pt alert and oriented to person and place.

## 2024-01-06 NOTE — ED Notes (Signed)
 Attempted IV x2 without success

## 2024-01-06 NOTE — Discharge Instructions (Addendum)
 Please call the urology clinic Monday morning to set an appointment to evaluate if the foley catheter can safely be removed.

## 2024-01-06 NOTE — ED Notes (Signed)
 Pt still hasn't voided.  Bladder scan shows , doctor notified. Per Dr. Floy, insert foley catheter.

## 2024-01-06 NOTE — ED Notes (Signed)
 Called to [facility] per RN Will @10 :00 pm/Transport to [facility].

## 2024-01-06 NOTE — ED Provider Notes (Signed)
 [facility] Provider Note    Event Date/Time   First MD Initiated Contact with Patient 01/06/24 1511     (approximate)   History   Change in urination   HPI  [name] is a 78 y.o. female  who presents to the emergency department today because of concern for possible UTI and abdominal pain. History is primarily obtained from family at bedside. They had concern for possible UTI over a week ago, however it appears no testing had been performed. Today facility reported that the patient had not had any urination. She did intermittently complain of abdominal pain. No reported fevers.       Physical Exam   Triage Vital Signs: ED Triage Vitals [01/06/24 1522]  Encounter Vitals Group     BP 129/70     Girls Systolic BP Percentile      Girls Diastolic BP Percentile      Boys Systolic BP Percentile      Boys Diastolic BP Percentile      Pulse Rate (!) 43     Resp 16     Temp 98.2 F (36.8 C)     Temp Source Oral     SpO2 100 %     Weight 150 lb 2.1 oz (68.1 kg)     Height 5' 7 (1.702 m)     Head Circumference      Peak Flow      Pain Score      Pain Loc      Pain Education      Exclude from Growth Chart     Most recent vital signs: Vitals:   01/06/24 1522  BP: 129/70  Pulse: (!) 43  Resp: 16  Temp: 98.2 F (36.8 C)  SpO2: 100%   General: Awake, alert, not completely oriented. CV:  Good peripheral perfusion.  Resp:  Normal effort. Lungs clear. Abd:  No distention. Tender in the mid abdomen. No rebound. No guarding. Soft. Other:  Right lower leg wrapped.   ED Results / Procedures / Treatments   Labs (all labs ordered are listed, but only abnormal results are displayed) Labs Reviewed  COMPREHENSIVE METABOLIC PANEL WITH GFR - Abnormal; Notable for the following components:      Result Value   Glucose, Bld 136 (*)    Total Protein 6.3 (*)    Albumin 3.3 (*)    All other components within normal limits  URINALYSIS, ROUTINE W  REFLEX MICROSCOPIC - Abnormal; Notable for the following components:   Color, Urine YELLOW (*)    APPearance CLOUDY (*)    Glucose, UA >=500 (*)    Hgb urine dipstick SMALL (*)    Protein, ur 30 (*)    Leukocytes,Ua MODERATE (*)    Bacteria, UA MANY (*)    All other components within normal limits  CBC WITH DIFFERENTIAL/PLATELET  LIPASE, BLOOD     EKG  I, Guadalupe Eagles, attending physician, personally viewed and interpreted this EKG  EKG Time: 1525 Rate: 55 Rhythm: sinus rhythm Axis: left axis deviation Intervals: qtc 427 QRS: LAFB ST changes: no st elevation Impression: abnormal ekg   RADIOLOGY None  PROCEDURES:  Critical Care performed: No    MEDICATIONS ORDERED IN ED: Medications - No data to display   IMPRESSION / MDM / ASSESSMENT AND PLAN / ED COURSE  I reviewed the triage vital signs and the nursing notes.  Differential diagnosis includes, but is not limited to, AKI, UTI, stone  Patient's presentation is most consistent with acute presentation with potential threat to life or bodily function.  Patient presented to the emergency department today because of concern for decreased urination that started today.  Patient had In-N-Out cath performed which was concerning for urinary tract infection.  Blood work without concerning leukocytosis.  A dose of IV antibiotics was given.  She was then given fluids and observed in the emergency department for a number of hours without ability to urinate.  Because of this I do have concern for continued urinary retention.  Indwelling Foley catheter was placed.  Will plan on discharging with prescription for antibiotics.  Will give urology follow-up information.      FINAL CLINICAL IMPRESSION(S) / ED DIAGNOSES   Final diagnoses:  Lower urinary tract infectious disease  Urinary retention       Note:  This document was prepared using Dragon voice recognition software and may include  unintentional dictation errors.    Floy Roberts, MD 01/06/24 2139

## 2024-01-11 ENCOUNTER — Encounter: Attending: Physician Assistant | Admitting: Physician Assistant

## 2024-01-11 DIAGNOSIS — L97812 Non-pressure chronic ulcer of other part of right lower leg with fat layer exposed: Secondary | ICD-10-CM | POA: Diagnosis not present

## 2024-01-11 DIAGNOSIS — I110 Hypertensive heart disease with heart failure: Secondary | ICD-10-CM | POA: Diagnosis not present

## 2024-01-11 DIAGNOSIS — E11622 Type 2 diabetes mellitus with other skin ulcer: Secondary | ICD-10-CM | POA: Diagnosis present

## 2024-01-11 DIAGNOSIS — I69353 Hemiplegia and hemiparesis following cerebral infarction affecting right non-dominant side: Secondary | ICD-10-CM | POA: Diagnosis not present

## 2024-01-11 DIAGNOSIS — I5042 Chronic combined systolic (congestive) and diastolic (congestive) heart failure: Secondary | ICD-10-CM | POA: Diagnosis not present

## 2024-01-13 ENCOUNTER — Other Ambulatory Visit (INDEPENDENT_AMBULATORY_CARE_PROVIDER_SITE_OTHER): Payer: Self-pay | Admitting: Nurse Practitioner

## 2024-01-13 DIAGNOSIS — I739 Peripheral vascular disease, unspecified: Secondary | ICD-10-CM

## 2024-01-17 ENCOUNTER — Encounter: Admitting: Physician Assistant

## 2024-01-17 DIAGNOSIS — E11622 Type 2 diabetes mellitus with other skin ulcer: Secondary | ICD-10-CM | POA: Diagnosis not present

## 2024-01-18 ENCOUNTER — Ambulatory Visit (INDEPENDENT_AMBULATORY_CARE_PROVIDER_SITE_OTHER): Admitting: Nurse Practitioner

## 2024-01-18 ENCOUNTER — Encounter (INDEPENDENT_AMBULATORY_CARE_PROVIDER_SITE_OTHER)

## 2024-01-18 ENCOUNTER — Other Ambulatory Visit: Payer: Self-pay

## 2024-01-18 DIAGNOSIS — Z87891 Personal history of nicotine dependence: Secondary | ICD-10-CM

## 2024-01-18 DIAGNOSIS — I2510 Atherosclerotic heart disease of native coronary artery without angina pectoris: Secondary | ICD-10-CM | POA: Diagnosis present

## 2024-01-18 DIAGNOSIS — E785 Hyperlipidemia, unspecified: Secondary | ICD-10-CM | POA: Diagnosis present

## 2024-01-18 DIAGNOSIS — Z7901 Long term (current) use of anticoagulants: Secondary | ICD-10-CM

## 2024-01-18 DIAGNOSIS — Z79899 Other long term (current) drug therapy: Secondary | ICD-10-CM

## 2024-01-18 DIAGNOSIS — N390 Urinary tract infection, site not specified: Secondary | ICD-10-CM | POA: Diagnosis present

## 2024-01-18 DIAGNOSIS — G40209 Localization-related (focal) (partial) symptomatic epilepsy and epileptic syndromes with complex partial seizures, not intractable, without status epilepticus: Secondary | ICD-10-CM | POA: Diagnosis present

## 2024-01-18 DIAGNOSIS — L97411 Non-pressure chronic ulcer of right heel and midfoot limited to breakdown of skin: Secondary | ICD-10-CM | POA: Diagnosis present

## 2024-01-18 DIAGNOSIS — I5032 Chronic diastolic (congestive) heart failure: Secondary | ICD-10-CM | POA: Diagnosis present

## 2024-01-18 DIAGNOSIS — E11621 Type 2 diabetes mellitus with foot ulcer: Secondary | ICD-10-CM | POA: Diagnosis present

## 2024-01-18 DIAGNOSIS — Z7401 Bed confinement status: Secondary | ICD-10-CM

## 2024-01-18 DIAGNOSIS — A4152 Sepsis due to Pseudomonas: Principal | ICD-10-CM | POA: Diagnosis present

## 2024-01-18 DIAGNOSIS — I69351 Hemiplegia and hemiparesis following cerebral infarction affecting right dominant side: Secondary | ICD-10-CM

## 2024-01-18 DIAGNOSIS — M79606 Pain in leg, unspecified: Secondary | ICD-10-CM | POA: Diagnosis not present

## 2024-01-18 DIAGNOSIS — Z8249 Family history of ischemic heart disease and other diseases of the circulatory system: Secondary | ICD-10-CM

## 2024-01-18 DIAGNOSIS — I110 Hypertensive heart disease with heart failure: Secondary | ICD-10-CM | POA: Diagnosis present

## 2024-01-18 DIAGNOSIS — Z794 Long term (current) use of insulin: Secondary | ICD-10-CM

## 2024-01-18 DIAGNOSIS — Z1629 Resistance to other single specified antibiotic: Secondary | ICD-10-CM | POA: Diagnosis present

## 2024-01-18 NOTE — ED Triage Notes (Signed)
 Pt to ED via EMS from [facility], pt was seen for leg infection 10 days ago and was placed on abx, pt completed course. Per ems pts son visited facility today and states she doesn't seem to be improving, right leg wrapped in ace bandage, left leg no redness or swelling noted. Pts son states she has had dec appetite and inc fatigue. Pt denies pain

## 2024-01-18 NOTE — ED Notes (Signed)
Pt refusing labs at this time.

## 2024-01-19 ENCOUNTER — Inpatient Hospital Stay
Admission: EM | Admit: 2024-01-19 | Discharge: 2024-01-25 | DRG: 872 | Disposition: A | Discharge status: Skilled Nursing Facility | Attending: Internal Medicine | Admitting: Internal Medicine

## 2024-01-19 ENCOUNTER — Emergency Department

## 2024-01-19 DIAGNOSIS — E785 Hyperlipidemia, unspecified: Secondary | ICD-10-CM | POA: Diagnosis present

## 2024-01-19 DIAGNOSIS — R5383 Other fatigue: Secondary | ICD-10-CM

## 2024-01-19 DIAGNOSIS — R338 Other retention of urine: Secondary | ICD-10-CM | POA: Insufficient documentation

## 2024-01-19 DIAGNOSIS — E119 Type 2 diabetes mellitus without complications: Secondary | ICD-10-CM

## 2024-01-19 DIAGNOSIS — A4152 Sepsis due to Pseudomonas: Secondary | ICD-10-CM

## 2024-01-19 DIAGNOSIS — Z609 Problem related to social environment, unspecified: Secondary | ICD-10-CM

## 2024-01-19 DIAGNOSIS — L97411 Non-pressure chronic ulcer of right heel and midfoot limited to breakdown of skin: Secondary | ICD-10-CM | POA: Diagnosis present

## 2024-01-19 DIAGNOSIS — A419 Sepsis, unspecified organism: Secondary | ICD-10-CM | POA: Diagnosis present

## 2024-01-19 DIAGNOSIS — N390 Urinary tract infection, site not specified: Principal | ICD-10-CM | POA: Diagnosis present

## 2024-01-19 DIAGNOSIS — Z87828 Personal history of other (healed) physical injury and trauma: Secondary | ICD-10-CM

## 2024-01-19 DIAGNOSIS — I5189 Other ill-defined heart diseases: Secondary | ICD-10-CM

## 2024-01-19 DIAGNOSIS — G40209 Localization-related (focal) (partial) symptomatic epilepsy and epileptic syndromes with complex partial seizures, not intractable, without status epilepticus: Secondary | ICD-10-CM | POA: Diagnosis present

## 2024-01-19 DIAGNOSIS — R339 Retention of urine, unspecified: Secondary | ICD-10-CM

## 2024-01-19 DIAGNOSIS — G8111 Spastic hemiplegia affecting right dominant side: Secondary | ICD-10-CM | POA: Diagnosis present

## 2024-01-19 DIAGNOSIS — I10 Essential (primary) hypertension: Secondary | ICD-10-CM | POA: Diagnosis present

## 2024-01-19 DIAGNOSIS — B965 Pseudomonas (aeruginosa) (mallei) (pseudomallei) as the cause of diseases classified elsewhere: Secondary | ICD-10-CM

## 2024-01-19 DIAGNOSIS — Z8673 Personal history of transient ischemic attack (TIA), and cerebral infarction without residual deficits: Secondary | ICD-10-CM

## 2024-01-19 LAB — URINALYSIS, W/ REFLEX TO CULTURE (INFECTION SUSPECTED)
Bilirubin Urine: NEGATIVE
Glucose, UA: >=500 mg/dL — AB
Ketones, ur: NEGATIVE mg/dL
Nitrite: POSITIVE — AB
Protein, ur: 30 mg/dL — AB
Specific Gravity, Urine: 1.012 (ref 1.005–1.030)
WBC, UA: >50 WBC/hpf (ref 0–5)
pH: 5 (ref 5.0–8.0)

## 2024-01-19 MED ORDER — APIXABAN 5 MG PO TABS
5.0000 mg | ORAL_TABLET | Freq: Two times a day (BID) | ORAL | Status: DC
  Administered 2024-01-19 – 2024-01-22 (×7): 5 mg via ORAL
  Filled 2024-01-19 (×7): qty 1

## 2024-01-19 MED ORDER — ATORVASTATIN CALCIUM 20 MG PO TABS
20.0000 mg | ORAL_TABLET | Freq: Every day | ORAL | Status: DC
  Administered 2024-01-19 – 2024-01-22 (×4): 20 mg via ORAL
  Filled 2024-01-19 (×4): qty 1

## 2024-01-19 MED ORDER — SULFAMETHOXAZOLE-TRIMETHOPRIM 800-160 MG PO TABS
1.0000 | ORAL_TABLET | Freq: Two times a day (BID) | ORAL | Status: DC
  Administered 2024-01-19 – 2024-01-22 (×7): 1 via ORAL
  Filled 2024-01-19 (×7): qty 1

## 2024-01-19 MED ORDER — LEVETIRACETAM 500 MG PO TABS
500.0000 mg | ORAL_TABLET | Freq: Two times a day (BID) | ORAL | Status: DC
  Administered 2024-01-19 – 2024-01-22 (×7): 500 mg via ORAL
  Filled 2024-01-19 (×7): qty 1

## 2024-01-19 MED ORDER — METOPROLOL SUCCINATE ER 25 MG PO TB24
25.0000 mg | ORAL_TABLET | Freq: Every day | ORAL | Status: DC
  Administered 2024-01-20: 25 mg via ORAL
  Filled 2024-01-19 (×4): qty 1

## 2024-01-19 NOTE — ED Notes (Signed)
 Purewick placed per Cyrena MD

## 2024-01-19 NOTE — ED Notes (Signed)
 Provided pt with gatorlyte to drink.

## 2024-01-19 NOTE — ED Notes (Signed)
 Bladder scan volume approx 

## 2024-01-19 NOTE — TOC Initial Note (Signed)
 Transition of Care [facility]) - Initial/Assessment Note    Patient Details  Name: [name] MRN: [id number] Date of Birth: [birth year]  Transition of Care [facility]) CM/SW Contact:    Edsel DELENA Fischer, LCSW Phone Number: 01/19/2024, 6:24 PM  Clinical Narrative:                  SW met with pt at bedside.  Per pt report: Pt stated that she came to ED due to leg is bothering me.  Lives with son. No safety/ DV concerns:  PCP: Dr. Antonetta and Pharmacy:unknown.  Pt stated that son- [name] Portugal helps with ADLs and IADLs.  Pt gave verbal permission for SW to speak with son.  Pt stated that son drives her and will assist with transportation at discharge.  No financial concerns or issues with paying for medications.  Incontinence: yes, CPAP/ Oxygen: no   Barriers to Discharge: ED Facility/Family Requesting Medication(s) Adjustment   Patient Goals and CMS Choice   CMS Medicare.gov Compare Post Acute Care list provided to:: Patient Represenative (must comment) (son- [name] Portugal, will follow up regarding choice) Choice offered to / list presented to : Adult Children (son- [name] Portugal, will follow up regarding choice)      Expected Discharge Plan and Services In-house Referral: Clinical Social Work     Living arrangements for the past 2 months: Single Family Home                                      Prior Living Arrangements/Services Living arrangements for the past 2 months: Single Family Home Lives with:: Adult Children Patient language and need for interpreter reviewed:: Yes Do you feel safe going back to the place where you live?: Yes        Care giver support system in place?: Yes (comment) Current home services: DME (walker, bedside commode) Criminal Activity/Legal Involvement Pertinent to Current Situation/Hospitalization: No - Comment as needed  Activities of Daily Living      Permission Sought/Granted                  Emotional Assessment Appearance::  Appears stated age Attitude/Demeanor/Rapport: Lethargic Affect (typically observed): Appropriate Orientation: : Oriented to Self, Oriented to Place   Psych Involvement: No (comment)  Admission diagnosis:  leg pain Patient Active Problem List   Diagnosis Date Noted   Hypokalemia 10/18/2022   Right knee pain 10/13/2022   Hypoglycemia 03/28/2022   Acute metabolic encephalopathy 03/28/2022   UTI (urinary tract infection) 03/28/2022   Encounter for well adult exam with abnormal findings 07/27/2021   HLD (hyperlipidemia) 04/14/2021   Vitamin D  deficiency 04/14/2021   B12 deficiency 04/14/2021   Rupture of left biceps tendon 06/19/2020   Goals of care, counseling/discussion    Palliative care by specialist    DNR (do not resuscitate) discussion    Sepsis without acute organ dysfunction (HCC) 04/05/2020   AKI (acute kidney injury) (HCC) 04/05/2020   Hyperkalemia 04/05/2020   Abnormal LFTs 04/05/2020   Mild protein malnutrition (HCC) 04/05/2020   Grade I diastolic dysfunction 04/05/2020   Closed fracture of right distal femur (HCC) 04/05/2020   Prolonged QT interval 04/05/2020   Dysuria 06/12/2019   Preventative health care 08/18/2018   Partial symptomatic epilepsy with complex partial seizures, not intractable, without status epilepticus (HCC) 12/08/2017   Shingles 10/26/2017   Constipation 04/20/2017   Skin ulcer of right heel,  limited to breakdown of skin (HCC) 02/10/2017   Muscle spasticity 01/20/2016   Bilateral lower extremity edema 12/23/2015   Acute confusional state 10/28/2015   Right spastic hemiparesis (HCC) 10/07/2015   Right shoulder pain 07/24/2015   Fracture, intertrochanteric, right femur (HCC) 06/10/2015   Stercoral ulcer of rectum 05/16/2015   GI bleed 05/13/2015   BRBPR (bright red blood per rectum) 05/13/2015   CHF (congestive heart failure) (HCC)    Type 2 diabetes mellitus (HCC)    Hypertension    History of stroke    Lower GI bleed    Left-sided  cerebrovascular accident (CVA) (HCC) 04/28/2015   PCP:  Andi Jointer, DO Pharmacy:   [facility] - [place] - [address] [place] ([place]) [place] [address] Phone: [phone] Fax: [phone]  [facility] - [place], [place] - [address] [place] [address] Phone: [phone] Fax: [phone]     Social Drivers of Health (SDOH) Social History: SDOH Screenings   Food Insecurity: No Food Insecurity (10/13/2022)  Housing: Low Risk  (10/13/2022)  Transportation Needs: No Transportation Needs (10/13/2022)  Utilities: Not At Risk (10/13/2022)  Depression (PHQ2-9): Low Risk  (02/10/2022)  Tobacco Use: Medium Risk (01/18/2024)   SDOH Interventions:     Readmission Risk Interventions     No data to display

## 2024-01-19 NOTE — ED Notes (Signed)
 Patient cleaned from stool incontinence. Brief changed and purewick removed. Foley placed with initial output. Secured to leg via stat-loc and skin barrier applied. Tubing free from kinks. Blankets placed. CB in reach. Will continue to monitor.

## 2024-01-19 NOTE — ED Notes (Signed)
 Patients son [name] given update and all questions answered.

## 2024-01-19 NOTE — ED Provider Notes (Signed)
.-----------------------------------------   10:57 AM on 01/19/2024 -----------------------------------------  Blood pressure 110/73, pulse 70, temperature 98.5 F (36.9 C), temperature source Oral, resp. rate 18, height 5' 7 (1.702 m), weight 68 kg, SpO2 100%.  Assuming care from Dr. Cyrena.  In short, [name] is a 78 y.o. female with a chief complaint of Leg Pain .  Refer to the original H&P for additional details.  The current plan of care is to follow UA.  Pt unable to void, 300cc in bladder on scan done by RN. Will place foley. Reordered her home meds including keppra , last EKG from 7/4 Qtc was not prolonged.  UA is consistent with UTI, this might explain why she is also having some urinary retention.  Will start her on Bactrim .  Her urine cultures are susceptible to Bactrim .  She is cleared for TOC and social work.    Clinical Course as of 01/19/24 1244  Thu Jan 19, 2024  1235 Urinalysis, w/ Reflex to Culture (Infection Suspected) -Urine, Clean Catch(!) UA is consistent with UTI. [TT]    Clinical Course User Index [TT] Waymond Lorelle Cummins, MD      Waymond Lorelle Cummins, MD 01/19/24 1245

## 2024-01-19 NOTE — ED Notes (Signed)
 Attempted in and out cath again, was unsuccessful, pt having difficulty tolerating lower extremely movement and tensing up.

## 2024-01-19 NOTE — ED Notes (Signed)
 Metoprolol  held r/t bradycardia. MD Tan notified.

## 2024-01-19 NOTE — ED Provider Notes (Signed)
 [facility] Provider Note    Event Date/Time   First MD Initiated Contact with Patient 01/19/24 0229     (approximate)   History   Leg Pain   HPI  [name] is a 78 y.o. female   Past medical history of prior CVA with right-sided deficits coming from a nursing facility with her son for request mainly for social work medical equipment for her home.  The son states that the mother has had this chronic pressure wound to the right lower extremity that has been very well cared for by wound clinic and was seen yesterday with pretty good healing and no infectious changes.  It was wrapped up and the son created a special pillow to offload pressure from the wound on the back of the calf to be used at her nursing facility but they refused to use the device.  This frustrated both the patient and the son as they were told to try their best to keep pressure off of the affected wound.  He decided to remove her from the facility and take her home to care for her himself.  He unfortunately does not have a hospital bed and needs this equipment to be arranged prior to be able to take her home.  Both the patient and son do not want blood drawn.  They do request urinalysis and chest x-ray as she has had a dry chronic nonproductive cough, and had a recent urinary tract infection, and has been feeling fatigued.  She has had no fevers or chills.  Did request that we do not unwrap the leg because it was just checked yesterday and had no infectious changes and has been healing well, and she has quite a bit of pain with dressing changes.   Independent Historian contributed to assessment above: Son gives information as above  External Medical Documents Reviewed: Wound care clinic note from a couple days ago      Physical Exam   Triage Vital Signs: ED Triage Vitals  Encounter Vitals Group     BP 01/18/24 2034 120/86     Girls Systolic BP Percentile --      Girls  Diastolic BP Percentile --      Boys Systolic BP Percentile --      Boys Diastolic BP Percentile --      Pulse Rate 01/18/24 2034 76     Resp 01/18/24 2034 20     Temp 01/18/24 2034 98.5 F (36.9 C)     Temp Source 01/18/24 2034 Oral     SpO2 01/18/24 2034 100 %     Weight 01/18/24 2039 149 lb 14.6 oz (68 kg)     Height 01/18/24 2039 5' 7 (1.702 m)     Head Circumference --      Peak Flow --      Pain Score 01/18/24 2038 0     Pain Loc --      Pain Education --      Exclude from Growth Chart --     Most recent vital signs: Vitals:   01/19/24 0234 01/19/24 0236  BP: 110/73 110/73  Pulse:  70  Resp:  18  Temp:  98.5 F (36.9 C)  SpO2:  100%    General: Awake, no distress.  CV:  Good peripheral perfusion.  Resp:  Normal effort.  Abd:  No distention.  Other:  Right sided weakness chronic per son and patient report from old stroke.  Soft nontender  abdomen deep palpation all quadrants.  Appears euvolemic overall nontoxic afebrile.  Wound to the right lower extremity is wrapped.  She has clear lungs without focality or wheezing.   ED Results / Procedures / Treatments   Labs (all labs ordered are listed, but only abnormal results are displayed) Labs Reviewed  URINALYSIS, W/ REFLEX TO CULTURE (INFECTION SUSPECTED)     RADIOLOGY I independently reviewed and interpreted chest x-ray and I see no obvious focality or pneumothorax I also reviewed radiologist's formal read.   PROCEDURES:  Critical Care performed: No  Procedures   MEDICATIONS ORDERED IN ED: Medications - No data to display   IMPRESSION / MDM / ASSESSMENT AND PLAN / ED COURSE  I reviewed the triage vital signs and the nursing notes.                                Patient's presentation is most consistent with severe exacerbation of chronic illness.  Differential diagnosis includes, but is not limited to, urinary tract infection, bacterial pneumonia, wound infection, social issues   MDM:    I  have placed a TOC consultation for their chief complaint of requesting medical devices to the home as they are no longer going to stay at her nursing facility.  They requested not to have blood drawn or to have the wrap removed from her leg, and I explained to them that these may help with diagnostics and therapeutics and they are okay with foregoing these testing and exam.  They do however want to check a urine and a chest x-ray and I have ordered these tests.  Unfortunately straight catheterization did not return any urine and she just urinated in her briefs.  Will place a pure wick to collect a sample.  Chest x-ray shows no focality and I hear no focality on my exam as she has no fever and is breathing comfortably I doubt there is a bacterial pneumonia.  Since she has no obvious source of infection I will defer antibiotics for the time being until urine can be collected and assessed.           FINAL CLINICAL IMPRESSION(S) / ED DIAGNOSES   Final diagnoses:  Social problem  H/O open leg wound  Other fatigue     Rx / DC Orders   ED Discharge Orders     None        Note:  This document was prepared using Dragon voice recognition software and may include unintentional dictation errors.    Cyrena Mylar, MD 01/19/24 [phone]

## 2024-01-19 NOTE — ED Notes (Signed)
 Patients son at the bedside feeding pt food from outside facility. Patient up in bed eating without difficulty. Ice provided.

## 2024-01-19 NOTE — Discharge Instructions (Signed)
 Please make sure to follow-up with urology once he gets discharged for further management of your Foley and urinary retention.

## 2024-01-20 NOTE — Consult Note (Addendum)
 WOC Nurse Consult Note: Reason for Consult: Consult requested for right leg wound.  Performed remotely after review of the progress notes.   Pt is followed by the outpatient wound care center and was recently assessed on 7/15.  Chronic full thickness wound to right leg was noted to be improving, approx 66% red, 34% yellow at that time.  Pt lives in a facility and the nurses change her dressing Q Mon/Wed/Fri as outlined by the outpatient wound care center's directions. Continue present plan of care.   Topical treatment orders provided for bedside nurses to perform as follows:  Bedside nurse; please change dressing to right leg Q MON/WED/FRI as follows: **If family wants to bring in Prisma from home, please use this dressing as outlined below. Otherwise, substitute Aquacel Soila # [phone]) since we do not carry Prisma in the [facility] formulary.   1. Cleanse wound with Vasche Soila # [phone]) pat dry 2. Apply a piece of Prisma dressing over the wound.  If not availbale, cut a piece of Aquacel to apply 3. Apply an oil emulsion dressing (Adaptic) over the dressing 4. Cover with ABD pad and kerlex  Pt should resume follow-up with the outpatient wound care center after discharge.  Please re-consult if further assistance is needed.  Thank-you,  Stephane Fought MSN, RN, CWOCN, CWCN-AP, CNS Contact Mon-Fri 0700-1500: [phone]

## 2024-01-20 NOTE — Evaluation (Signed)
 Occupational Therapy Evaluation Patient Details Name: [name] MRN: [id number] DOB: [birth year] Today's Date: 01/20/2024   History of Present Illness   [name] is a 78 y.o. female admitted for leg pain. Past medical history of prior CVA with right-sided deficits coming from a nursing facility with her son for request mainly for social work medical equipment for her home.     Clinical Impressions Pt was seen for OT evaluation this date. Prior to hospital admission, pt was at a SNF per sons Sheldon present, [name] by speaker phone). Sons share that they took pt out of the facility and brought her to the ED due to lack of care and concerns over UTI. Sons report their goal is for pt to go to different/better SNF and eventually return home with son able to provide care. Pt presents to acute OT demonstrating impaired ADL performance and functional mobility 2/2 decreased strength, activity tolerance, balance, cognition, ROM, and significant pain with even light tough to RUE with noted contracture to wrist and hand (See OT problem list for additional functional deficits). Pt currently requires TOTAL A +2 for bed level bathing, dressing, and toileting. MAX A for adjustments made to carrot/wedge pillow to support composite finger extension to prevent further contracture and skin breakdown. Pt will benefit from skilled OT services to address noted impairments and functional limitations (see below for any additional details) in order to maximize safety and independence while minimizing falls risk and caregiver burden. Anticipate the need for follow up OT services upon acute hospital DC.     If plan is discharge home, recommend the following:   Two people to help with walking and/or transfers;Two people to help with bathing/dressing/bathroom;Direct supervision/assist for medications management;Supervision due to cognitive status;Direct supervision/assist for financial management;Assist for  transportation;Assistance with cooking/housework;Assistance with feeding;Help with stairs or ramp for entrance     Functional Status Assessment   Patient has had a recent decline in their functional status and demonstrates the ability to make significant improvements in function in a reasonable and predictable amount of time.     Equipment Recommendations   Other (comment) (defer to next venue; if going home would need hoyer lift and w/c with cushion and bilat leg rests)     Recommendations for Other Services         Precautions/Restrictions   Precautions Precautions: Fall Recall of Precautions/Restrictions: Impaired Restrictions Weight Bearing Restrictions Per Provider Order: No     Mobility Bed Mobility               General bed mobility comments: deferred 2/2 pain    Transfers                          Balance                                           ADL either performed or assessed with clinical judgement   ADL Overall ADL's : Needs assistance/impaired Eating/Feeding: Bed level;Moderate assistance;Maximal assistance;With caregiver independent assisting Eating/Feeding Details (indicate cue type and reason): Son assisting with feeding pt and notes pt typically able to self feed wiht setup but currently has a pulse ox attached to her finger making it very challenging.   Grooming Details (indicate cue type and reason): anticipate pt requiring MOD A for washing face and brushing teeth Upper Body Bathing:  Total assistance;Bed level   Lower Body Bathing: Total assistance;+2 for physical assistance;Bed level   Upper Body Dressing : Bed level;Total assistance   Lower Body Dressing: Bed level;+2 for physical assistance;Total assistance                       Vision         Perception         Praxis         Pertinent Vitals/Pain Pain Assessment Pain Assessment: Faces Faces Pain Scale: Hurts whole lot Pain  Location: R wrist/hand with light touch or any attempt at repositioning Pain Descriptors / Indicators: Moaning, Grimacing, Guarding Pain Intervention(s): Limited activity within patient's tolerance, Monitored during session, Repositioned, Patient requesting pain meds-RN notified     Extremity/Trunk Assessment Upper Extremity Assessment Upper Extremity Assessment: Generalized weakness;RUE deficits/detail RUE Deficits / Details: hx CVA, unable to fully assess 2/2 significant pain but notable contractures in the wrist and hand, pt with carrot/wedge pillow in hand to support finger extension RUE: Unable to fully assess due to pain RUE Coordination: decreased fine motor;decreased gross motor   Lower Extremity Assessment Lower Extremity Assessment: Generalized weakness;Defer to PT evaluation (hx of CVA with r side deficits)   Cervical / Trunk Assessment Cervical / Trunk Assessment: Normal   Communication Communication Communication: Impaired Factors Affecting Communication: Difficulty expressing self;Reduced clarity of speech   Cognition Arousal: Alert Behavior During Therapy: Flat affect, Anxious               OT - Cognition Comments: very fearful/anxious of any attempts at repositioning of R hand                 Following commands: Intact       Cueing  General Comments   Cueing Techniques: Verbal cues;Tactile cues;Gestural cues      Exercises     Shoulder Instructions      Home Living Family/patient expects to be discharged to:: Skilled nursing facility Living Arrangements: Children Available Help at Discharge: Family Type of Home: House Home Access: Stairs to enter Secretary/administrator of Steps: 3 Entrance Stairs-Rails: Right Home Layout: One level     Bathroom Shower/Tub: Chief Strategy Officer: Standard Bathroom Accessibility: No   Home Equipment: Agricultural consultant (2 wheels);BSC/3in1   Additional Comments: Per son present, pt was at a  SNF and were not attending to her needs so son brought her to the ED.      Prior Functioning/Environment Prior Level of Function : Needs assist  Cognitive Assist : Mobility (cognitive);ADLs (cognitive)     Physical Assist : Mobility (physical);ADLs (physical) Mobility (physical): Gait ADLs (physical): Bathing;Dressing;IADLs;Toileting Mobility Comments: per sons (one present, other by phone), pt is essentially bedbound, staff using a hoyer lift at facility and son was using at home prior to that. ADLs Comments: per sons (one present, other by phone), pt requires significant assist for all aspects of bathing, dressing, toileting all from bed level as well as some assist for grooming and pt typically able to self feed with setup    OT Problem List: Decreased strength;Decreased coordination;Pain;Decreased range of motion;Decreased cognition;Decreased safety awareness;Decreased activity tolerance;Impaired balance (sitting and/or standing);Decreased knowledge of use of DME or AE;Impaired UE functional use   OT Treatment/Interventions: Self-care/ADL training;Therapeutic exercise;Therapeutic activities;Neuromuscular education;Cognitive remediation/compensation;DME and/or AE instruction;Patient/family education;Balance training;Manual therapy      OT Goals(Current goals can be found in the care plan section)   Acute Rehab OT Goals Patient Stated  Goal: sons' goal for pt to get better and eventually return home OT Goal Formulation: With patient/family Time For Goal Achievement: 02/03/24 Potential to Achieve Goals: Fair ADL Goals Pt Will Perform Eating: bed level;sitting;with supervision;with set-up (long sitting in bed, optimizing set up of food/tray, no pulse ox on hand) Pt Will Perform Grooming: with set-up;with supervision;bed level;sitting;with min assist (long sitting in bed to wash her face and brush her teeth, MIN A  PRN for thoroughness.) Additional ADL Goal #1: Pt will tolerate assist  for R hand hygiene and contracture management to prevent further skin breakdown or contracture.   OT Frequency:  Min 1X/week    Co-evaluation              AM-PAC OT 6 Clicks Daily Activity     Outcome Measure Help from another person eating meals?: A Lot Help from another person taking care of personal grooming?: A Lot Help from another person toileting, which includes using toliet, bedpan, or urinal?: Total Help from another person bathing (including washing, rinsing, drying)?: Total Help from another person to put on and taking off regular upper body clothing?: Total Help from another person to put on and taking off regular lower body clothing?: Total 6 Click Score: 8   End of Session Nurse Communication: Patient requests pain meds  Activity Tolerance: Patient limited by pain Patient left: in bed;with call bell/phone within reach;with bed alarm set;with family/visitor present  OT Visit Diagnosis: Other abnormalities of gait and mobility (R26.89);Muscle weakness (generalized) (M62.81);Pain;Other symptoms and signs involving cognitive function Pain - Right/Left: Right Pain - part of body: Hand                Time: 8490-8473 OT Time Calculation (min): 17 min Charges:  OT General Charges $OT Visit: 1 Visit OT Evaluation $OT Eval Moderate Complexity: 1 Mod  Warren SAUNDERS., MPH, MS, OTR/L ascom [phone] 01/20/24, 3:54 PM

## 2024-01-20 NOTE — Evaluation (Signed)
 Physical Therapy Evaluation Patient Details Name: [name] MRN: [id number] DOB: [birth year] Today's Date: 01/20/2024  History of Present Illness  [name] is a 78 y.o. female admitted for leg pain. Past medical history of prior CVA with right-sided deficits coming from a nursing facility with her son for request mainly for social work medical equipment for her home.  Clinical Impression  Pt is a pleasant 78 year old female who was admitted for leg pain. Unable to confirm pt's baseline cognition d/t family not present, pt currently A&O x2. Pt performs very limited bed mobility with total a x2, pt unable to participate in mobility d/t LE pain. Further transfers and ambulation not appropriate this date d/t pt's pain. Pt demonstrates deficits with balance, strength, and activity tolerance that are currently impacting her QoL. Pt would benefit from skilled PT interventions to meet therapy goals and improve overall function. PT to follow acutely as appropriate.          If plan is discharge home, recommend the following: Two people to help with walking and/or transfers;Two people to help with bathing/dressing/bathroom;Assist for transportation;Help with stairs or ramp for entrance;Supervision due to cognitive status   Can travel by private vehicle   No    Equipment Recommendations None recommended by PT  Recommendations for Other Services       Functional Status Assessment Patient has had a recent decline in their functional status and/or demonstrates limited ability to make significant improvements in function in a reasonable and predictable amount of time     Precautions / Restrictions Precautions Precautions: Fall Recall of Precautions/Restrictions: Impaired Restrictions Weight Bearing Restrictions Per Provider Order: No      Mobility  Bed Mobility Overal bed mobility: Needs Assistance Bed Mobility: Supine to Sit     Supine to sit: Total assist, +2 for physical  assistance     General bed mobility comments: total a x2 in attempt to sit up, pt very limited by high pain, unable to fully sit up this date, pt unable to tolerate movement    Transfers                   General transfer comment: unable to attempt this date d/t pt's pain    Ambulation/Gait               General Gait Details: unable to attempt this date d/t pt's pain  Stairs            Wheelchair Mobility     Tilt Bed    Modified Rankin (Stroke Patients Only)       Balance Overall balance assessment: Needs assistance Sitting-balance support: Bilateral upper extremity supported Sitting balance-Leahy Scale: Zero Sitting balance - Comments: unable to fully access d/t inability to fully sit upright                                     Pertinent Vitals/Pain Pain Assessment Pain Assessment: Faces Faces Pain Scale: Hurts whole lot Pain Location: bilat LE Pain Descriptors / Indicators: Constant, Grimacing, Guarding, Moaning, Restless Pain Intervention(s): Monitored during session, Limited activity within patient's tolerance    Home Living Family/patient expects to be discharged to:: Private residence Living Arrangements: Children Available Help at Discharge: Family Type of Home: House Home Access: Stairs to enter Entrance Stairs-Rails: Right Entrance Stairs-Number of Steps: 3   Home Layout: One level Home Equipment: Agricultural consultant (2  wheels);BSC/3in1 Additional Comments: confirming information per chart    Prior Function Prior Level of Function : Needs assist  Cognitive Assist : Mobility (cognitive);ADLs (cognitive)     Physical Assist : Mobility (physical);ADLs (physical) Mobility (physical): Gait ADLs (physical): Bathing;Dressing;IADLs;Toileting Mobility Comments: per chart review stated she has a RW, unsure of how much she uses it, pt originally denied using AD but later asked and stated she did ADLs Comments: per chart  review/pt son helps pt with ADLs/IADLS     Extremity/Trunk Assessment   Upper Extremity Assessment Upper Extremity Assessment: Generalized weakness    Lower Extremity Assessment Lower Extremity Assessment: Generalized weakness    Cervical / Trunk Assessment Cervical / Trunk Assessment: Normal  Communication   Communication Communication: Impaired Factors Affecting Communication: Difficulty expressing self;Reduced clarity of speech    Cognition Arousal: Alert Behavior During Therapy: Anxious   PT - Cognitive impairments: No family/caregiver present to determine baseline, Difficult to assess, Orientation, Memory, Safety/Judgement Difficult to assess due to: Impaired communication Orientation impairments: Place, Time                   PT - Cognition Comments: pt A & O x2, orientated to self and month, unable to confirm baseline Following commands: Intact       Cueing Cueing Techniques: Verbal cues, Tactile cues, Gestural cues     General Comments      Exercises     Assessment/Plan    PT Assessment Patient needs continued PT services  PT Problem List Decreased strength;Decreased range of motion;Decreased activity tolerance;Decreased balance;Decreased mobility;Decreased knowledge of use of DME;Decreased safety awareness       PT Treatment Interventions DME instruction;Gait training;Stair training;Functional mobility training;Therapeutic activities;Therapeutic exercise;Balance training;Patient/family education;Wheelchair mobility training    PT Goals (Current goals can be found in the Care Plan section)  Acute Rehab PT Goals Patient Stated Goal: unable to participate in goal formulation PT Goal Formulation: With patient Time For Goal Achievement: 02/03/24 Potential to Achieve Goals: Good    Frequency Min 2X/week     Co-evaluation               AM-PAC PT 6 Clicks Mobility  Outcome Measure Help needed turning from your back to your side while  in a flat bed without using bedrails?: A Lot Help needed moving from lying on your back to sitting on the side of a flat bed without using bedrails?: Total Help needed moving to and from a bed to a chair (including a wheelchair)?: Total Help needed standing up from a chair using your arms (e.g., wheelchair or bedside chair)?: Total Help needed to walk in hospital room?: Total Help needed climbing 3-5 steps with a railing? : Total 6 Click Score: 7    End of Session   Activity Tolerance: Patient limited by pain Patient left: in bed;with call bell/phone within reach;with bed alarm set Nurse Communication: Mobility status PT Visit Diagnosis: Other abnormalities of gait and mobility (R26.89);Muscle weakness (generalized) (M62.81);Hemiplegia and hemiparesis Hemiplegia - Right/Left: Right Hemiplegia - caused by: Cerebral infarction    Time: 8577-8566 PT Time Calculation (min) (ACUTE ONLY): 11 min   Charges:   PT Evaluation $PT Eval Moderate Complexity: 1 Mod   PT General Charges $$ ACUTE PT VISIT: 1 Visit         Zanetta Dehaan Romero-Perozo, SPT  01/20/2024, 3:07 PM

## 2024-01-20 NOTE — TOC Progression Note (Signed)
 Transition of Care [facility]) - Progression Note    Patient Details  Name: [name] MRN: [id number] Date of Birth: Aug 18, 1945  [facility]) CM/SW Contact  Edsel DELENA Fischer, LCSW Phone Number: 01/20/2024, 10:55 AM  Clinical Narrative:     SW spoke with son at bedside Irvin).  [name] expressed that he took care of pt for over 7 years and got burned out because he didn't ask for help.  [name] expressed concerns regarding pt treatment at [facility] and does not want pt to return.  [name] stated that his goal is to have pt return home eventually with supports in place like CAP/DA but would like pt transferred to [facility] if possible in the meantime.  SW explained the process of placement and that SW will reach out to requested SNF as well and others.  [name] expressed that he would like referral to CAP program and that he has spoken with family, friends, and other supports on how they can assist him when pt is ready to return home and that he is willing to pay out of pocket for support services, aide, if possible.    Barriers to Discharge: ED Facility/Family Requesting Medication(s) Adjustment  Expected Discharge Plan and Services In-house Referral: Clinical Social Work     Living arrangements for the past 2 months: Single Family Home                                       Social Determinants of Health (SDOH) Interventions SDOH Screenings   Food Insecurity: No Food Insecurity (10/13/2022)  Housing: Low Risk  (10/13/2022)  Transportation Needs: No Transportation Needs (10/13/2022)  Utilities: Not At Risk (10/13/2022)  Depression (PHQ2-9): Low Risk  (02/10/2022)  Tobacco Use: Medium Risk (01/18/2024)    Readmission Risk Interventions     No data to display

## 2024-01-20 NOTE — ED Notes (Signed)
 Patient's son requesting wound care for patient's right lower leg. States she is a M, W, F dressing change. MD Padochowski made aware.

## 2024-01-20 NOTE — ED Notes (Signed)
 Patient's son at bedside requesting to speak with social worker; Edsel (Child psychotherapist) made aware.

## 2024-01-21 LAB — CBG MONITORING, ED
Glucose-Capillary: 253 mg/dL — ABNORMAL HIGH (ref 70–99)
Glucose-Capillary: 205 mg/dL — ABNORMAL HIGH (ref 70–99)
Glucose-Capillary: 91 mg/dL (ref 70–99)
Glucose-Capillary: 212 mg/dL — ABNORMAL HIGH (ref 70–99)

## 2024-01-21 MED ORDER — INSULIN ASPART 100 UNIT/ML IJ SOLN: 0.0000 [IU] | Freq: Every day | INTRAMUSCULAR | Status: DC

## 2024-01-21 MED ORDER — INSULIN ASPART 100 UNIT/ML IJ SOLN
0.0000 [IU] | Freq: Every day | INTRAMUSCULAR | Status: DC
  Administered 2024-01-21 – 2024-01-23 (×2): 2 [IU] via SUBCUTANEOUS
  Filled 2024-01-21 (×2): qty 1

## 2024-01-21 MED ORDER — INSULIN ASPART 100 UNIT/ML IJ SOLN: 0.0000 [IU] | Freq: Three times a day (TID) | INTRAMUSCULAR | Status: DC

## 2024-01-21 MED ORDER — INSULIN ASPART 100 UNIT/ML IJ SOLN
0.0000 [IU] | Freq: Three times a day (TID) | INTRAMUSCULAR | Status: DC
  Administered 2024-01-21: 8 [IU] via SUBCUTANEOUS
  Administered 2024-01-21: 5 [IU] via SUBCUTANEOUS
  Administered 2024-01-22: 2 [IU] via SUBCUTANEOUS
  Administered 2024-01-22 – 2024-01-23 (×2): 3 [IU] via SUBCUTANEOUS
  Administered 2024-01-23: 2 [IU] via SUBCUTANEOUS
  Administered 2024-01-24: 3 [IU] via SUBCUTANEOUS
  Administered 2024-01-25: 5 [IU] via SUBCUTANEOUS
  Administered 2024-01-25: 3 [IU] via SUBCUTANEOUS
  Filled 2024-01-21 (×9): qty 1

## 2024-01-21 MED ORDER — INSULIN ASPART 100 UNIT/ML IJ SOLN
4.0000 [IU] | Freq: Three times a day (TID) | INTRAMUSCULAR | Status: DC
  Administered 2024-01-22 – 2024-01-23 (×5): 4 [IU] via SUBCUTANEOUS
  Filled 2024-01-21 (×5): qty 1

## 2024-01-21 NOTE — ED Provider Notes (Signed)
-----------------------------------------   2:45 AM on 01/21/2024 -----------------------------------------   Blood pressure 103/81, pulse 75, temperature 99.1 F (37.3 C), temperature source Oral, resp. rate 18, height 1.702 m (5' 7), weight 68 kg, SpO2 98%.  The patient is calm and cooperative at this time.  There have been no acute events since the last update.  Patient is resting and comfortable.  Awaiting disposition plan from [facility] team.   Gordan Huxley, MD 01/21/24 [phone]

## 2024-01-21 NOTE — NC FL2 (Addendum)
 [place]  MEDICAID FL2 LEVEL OF CARE FORM     IDENTIFICATION  Patient Name: [name] Birthdate: 05-11-1946 Sex: female Admission Date (Current Location): 01/19/2024  [facility] and [place] Number:  Chiropodist and Address:  [facility], [address], [place], [place] [address]      Provider Number: [id number]  Attending Physician Name and Address:  No att. providers found  Relative Name and Phone Number:  [name] [phone]    Current Level of Care: Hospital Recommended Level of Care: Skilled Nursing Facility Prior Approval Number:    Date Approved/Denied:   PASRR Number: [id number]  Discharge Plan: SNF    Current Diagnoses: Patient Active Problem List   Diagnosis Date Noted   Hypokalemia 10/18/2022   Right knee pain 10/13/2022   Hypoglycemia 03/28/2022   Acute metabolic encephalopathy 03/28/2022   UTI (urinary tract infection) 03/28/2022   Encounter for well adult exam with abnormal findings 07/27/2021   HLD (hyperlipidemia) 04/14/2021   Vitamin D  deficiency 04/14/2021   B12 deficiency 04/14/2021   Rupture of left biceps tendon 06/19/2020   Goals of care, counseling/discussion    Palliative care by specialist    DNR (do not resuscitate) discussion    Sepsis without acute organ dysfunction (HCC) 04/05/2020   AKI (acute kidney injury) (HCC) 04/05/2020   Hyperkalemia 04/05/2020   Abnormal LFTs 04/05/2020   Mild protein malnutrition (HCC) 04/05/2020   Grade I diastolic dysfunction 04/05/2020   Closed fracture of right distal femur (HCC) 04/05/2020   Prolonged QT interval 04/05/2020   Dysuria 06/12/2019   Preventative health care 08/18/2018   Partial symptomatic epilepsy with complex partial seizures, not intractable, without status epilepticus (HCC) 12/08/2017   Shingles 10/26/2017   Constipation 04/20/2017   Skin ulcer of right heel, limited to breakdown of skin (HCC) 02/10/2017   Muscle spasticity 01/20/2016    Bilateral lower extremity edema 12/23/2015   Acute confusional state 10/28/2015   Right spastic hemiparesis (HCC) 10/07/2015   Right shoulder pain 07/24/2015   Fracture, intertrochanteric, right femur (HCC) 06/10/2015   Stercoral ulcer of rectum 05/16/2015   GI bleed 05/13/2015   BRBPR (bright red blood per rectum) 05/13/2015   CHF (congestive heart failure) (HCC)    Type 2 diabetes mellitus (HCC)    Hypertension    History of stroke    Lower GI bleed    Left-sided cerebrovascular accident (CVA) (HCC) 04/28/2015    Orientation RESPIRATION BLADDER Height & Weight     Self, Time, Situation, Place  Normal External catheter Weight: 149 lb 14.6 oz (68 kg) Height:  5' 7 (170.2 cm)  BEHAVIORAL SYMPTOMS/MOOD NEUROLOGICAL BOWEL NUTRITION STATUS      Continent Diet (Diet regular Room service appropriate? Yes; Fluid consistency: Thin: General starting at 07/17)  AMBULATORY STATUS COMMUNICATION OF NEEDS Skin   Supervision Verbally Normal                       Personal Care Assistance Level of Assistance  Bathing, Feeding, Dressing Bathing Assistance: Limited assistance Feeding assistance: Limited assistance Dressing Assistance: Limited assistance     Functional Limitations Info  Sight, Hearing, Speech          SPECIAL CARE FACTORS FREQUENCY  PT (By licensed PT), OT (By licensed OT)     PT Frequency: 3x OT Frequency: 3x            Contractures Contractures Info: Present (Wrist and hand)    Additional Factors Info  Code Status, Allergies Code Status Info: DNR Allergies Info: Pork derived products           Current Medications (01/21/2024):  This is the current hospital active medication list Current Facility-Administered Medications  Medication Dose Route Frequency Provider Last Rate Last Admin   apixaban  (ELIQUIS ) tablet 5 mg  5 mg Oral BID Tan, Ting Xu, MD   5 mg at 01/21/24 1002   atorvastatin  (LIPITOR) tablet 20 mg  20 mg Oral Daily Tan, Ting Xu, MD   20  mg at 01/21/24 1000   insulin  aspart (novoLOG ) injection 0-15 Units  0-15 Units Subcutaneous TID WC Bradler, Evan K, MD   5 Units at 01/21/24 1337   insulin  aspart (novoLOG ) injection 0-5 Units  0-5 Units Subcutaneous QHS Bradler, Evan K, MD       insulin  aspart (novoLOG ) injection 4 Units  4 Units Subcutaneous TID WC Bradler, Evan K, MD       levETIRAcetam  (KEPPRA ) tablet 500 mg  500 mg Oral BID Tan, Ting Xu, MD   500 mg at 01/21/24 1002   metoprolol  succinate (TOPROL -XL) 24 hr tablet 25 mg  25 mg Oral Daily Tan, Ting Xu, MD   25 mg at 01/20/24 0911   sulfamethoxazole -trimethoprim  (BACTRIM  DS) 800-160 MG per tablet 1 tablet  1 tablet Oral Q12H Tan, Ting Xu, MD   1 tablet at 01/21/24 1002   Current Outpatient Medications  Medication Sig Dispense Refill   acetaminophen  (TYLENOL ) 325 MG tablet Take 650 mg by mouth every 6 (six) hours as needed.     atorvastatin  (LIPITOR) 20 MG tablet Take 20 mg by mouth daily.     Cholecalciferol  (VITAMIN D3) 50 MCG (2000 UT) TABS Take 1 tablet by mouth daily.     diclofenac  Sodium (VOLTAREN ) 1 % GEL Apply 4 g topically 4 (four) times daily. (Patient taking differently: Apply 4 g topically 4 (four) times daily. APPLY 4 GRAMS TOPICALLY TO EACH KNEE FOUR TIMES DAILY.) 100 g 0   ELIQUIS  5 MG TABS tablet Take 5 mg by mouth every 12 (twelve) hours.     guaiFENesin  (ROBITUSSIN) 100 MG/5ML liquid Take 10 mLs by mouth every 4 (four) hours as needed for cough or to loosen phlegm.     levETIRAcetam  (KEPPRA ) 500 MG tablet Take 1 tablet (500 mg total) by mouth 2 (two) times daily. (Patient taking differently: Take 500 mg by mouth every 12 (twelve) hours.) 20 tablet 0   losartan  (COZAAR ) 25 MG tablet Take 12.5 mg by mouth daily.      metoprolol  succinate (TOPROL -XL) 25 MG 24 hr tablet Take 25 mg by mouth daily.      polyethylene glycol (MIRALAX  / GLYCOLAX ) 17 g packet Take 17 g by mouth daily as needed for mild constipation or moderate constipation. Use Miralax  if Milk of  Magnesia is contraindicated (Example: Dialysis resident)     TRESIBA  FLEXTOUCH 100 UNIT/ML FlexTouch Pen Inject 10 Units into the skin at bedtime. (Patient taking differently: Inject 5 Units into the skin at bedtime.) 15 mL 0     Discharge Medications: Please see discharge summary for a list of discharge medications.  Relevant Imaging Results:  Relevant Lab Results:   Additional Information [id number]  [place] L Carisha Kantor, LCSW

## 2024-01-21 NOTE — ED Notes (Signed)
 Pt turned to right side.

## 2024-01-21 NOTE — ED Notes (Signed)
 Pt ate mashed potatoes and refused any more. Pt turned to left side and sacrum foam placed

## 2024-01-22 DIAGNOSIS — Z1629 Resistance to other single specified antibiotic: Secondary | ICD-10-CM | POA: Diagnosis present

## 2024-01-22 DIAGNOSIS — A4152 Sepsis due to Pseudomonas: Secondary | ICD-10-CM | POA: Diagnosis present

## 2024-01-22 DIAGNOSIS — R338 Other retention of urine: Secondary | ICD-10-CM | POA: Insufficient documentation

## 2024-01-22 DIAGNOSIS — E11621 Type 2 diabetes mellitus with foot ulcer: Secondary | ICD-10-CM | POA: Diagnosis present

## 2024-01-22 DIAGNOSIS — I2510 Atherosclerotic heart disease of native coronary artery without angina pectoris: Secondary | ICD-10-CM | POA: Diagnosis present

## 2024-01-22 DIAGNOSIS — L97411 Non-pressure chronic ulcer of right heel and midfoot limited to breakdown of skin: Secondary | ICD-10-CM | POA: Diagnosis present

## 2024-01-22 DIAGNOSIS — I110 Hypertensive heart disease with heart failure: Secondary | ICD-10-CM | POA: Diagnosis present

## 2024-01-22 DIAGNOSIS — Z7901 Long term (current) use of anticoagulants: Secondary | ICD-10-CM | POA: Diagnosis not present

## 2024-01-22 DIAGNOSIS — E1159 Type 2 diabetes mellitus with other circulatory complications: Secondary | ICD-10-CM | POA: Diagnosis not present

## 2024-01-22 DIAGNOSIS — I69351 Hemiplegia and hemiparesis following cerebral infarction affecting right dominant side: Secondary | ICD-10-CM | POA: Diagnosis not present

## 2024-01-22 DIAGNOSIS — G40209 Localization-related (focal) (partial) symptomatic epilepsy and epileptic syndromes with complex partial seizures, not intractable, without status epilepticus: Secondary | ICD-10-CM | POA: Diagnosis present

## 2024-01-22 DIAGNOSIS — E785 Hyperlipidemia, unspecified: Secondary | ICD-10-CM | POA: Diagnosis present

## 2024-01-22 DIAGNOSIS — A419 Sepsis, unspecified organism: Secondary | ICD-10-CM | POA: Diagnosis present

## 2024-01-22 DIAGNOSIS — Z7401 Bed confinement status: Secondary | ICD-10-CM | POA: Diagnosis not present

## 2024-01-22 DIAGNOSIS — G8111 Spastic hemiplegia affecting right dominant side: Secondary | ICD-10-CM | POA: Diagnosis not present

## 2024-01-22 DIAGNOSIS — M79606 Pain in leg, unspecified: Secondary | ICD-10-CM | POA: Diagnosis present

## 2024-01-22 DIAGNOSIS — Z8249 Family history of ischemic heart disease and other diseases of the circulatory system: Secondary | ICD-10-CM | POA: Diagnosis not present

## 2024-01-22 DIAGNOSIS — Z79899 Other long term (current) drug therapy: Secondary | ICD-10-CM | POA: Diagnosis not present

## 2024-01-22 DIAGNOSIS — N390 Urinary tract infection, site not specified: Secondary | ICD-10-CM | POA: Diagnosis present

## 2024-01-22 DIAGNOSIS — Z794 Long term (current) use of insulin: Secondary | ICD-10-CM | POA: Diagnosis not present

## 2024-01-22 DIAGNOSIS — I5032 Chronic diastolic (congestive) heart failure: Secondary | ICD-10-CM | POA: Diagnosis present

## 2024-01-22 DIAGNOSIS — Z87891 Personal history of nicotine dependence: Secondary | ICD-10-CM | POA: Diagnosis not present

## 2024-01-22 LAB — BASIC METABOLIC PANEL WITH GFR
Anion gap: 12 (ref 5–15)
BUN: 10 mg/dL (ref 8–23)
CO2: 24 mmol/L (ref 22–32)
Calcium: 9.4 mg/dL (ref 8.9–10.3)
Chloride: 104 mmol/L (ref 98–111)
Creatinine, Ser: 0.96 mg/dL (ref 0.44–1.00)
GFR, Estimated: >60 mL/min (ref >60)
Glucose, Bld: 188 mg/dL — ABNORMAL HIGH (ref 70–99)
Potassium: 4.7 mmol/L (ref 3.5–5.1)
Sodium: 140 mmol/L (ref 135–145)

## 2024-01-22 LAB — CBC WITH DIFFERENTIAL/PLATELET
Abs Immature Granulocytes: 0.07 K/uL (ref 0.00–0.07)
Basophils Absolute: 0.1 K/uL (ref 0.0–0.1)
Basophils Relative: 0 %
Eosinophils Absolute: 0.1 K/uL (ref 0.0–0.5)
Eosinophils Relative: 1 %
HCT: 42 % (ref 36.0–46.0)
Hemoglobin: 13.2 g/dL (ref 12.0–15.0)
Immature Granulocytes: 1 %
Lymphocytes Relative: 19 %
Lymphs Abs: 2.5 K/uL (ref 0.7–4.0)
MCH: 27.6 pg (ref 26.0–34.0)
MCHC: 31.4 g/dL (ref 30.0–36.0)
MCV: 87.9 fL (ref 80.0–100.0)
Monocytes Absolute: 0.8 K/uL (ref 0.1–1.0)
Monocytes Relative: 6 %
Neutro Abs: 9.7 K/uL — ABNORMAL HIGH (ref 1.7–7.7)
Neutrophils Relative %: 73 %
Platelets: 260 K/uL (ref 150–400)
RBC: 4.78 MIL/uL (ref 3.87–5.11)
RDW: 13.1 % (ref 11.5–15.5)
WBC: 13.3 K/uL — ABNORMAL HIGH (ref 4.0–10.5)
nRBC: 0 % (ref 0.0–0.2)

## 2024-01-22 LAB — PROTIME-INR
INR: 1.2 (ref 0.8–1.2)
Prothrombin Time: 15.9 s — ABNORMAL HIGH (ref 11.4–15.2)

## 2024-01-22 LAB — CBG MONITORING, ED
Glucose-Capillary: 172 mg/dL — ABNORMAL HIGH (ref 70–99)
Glucose-Capillary: 149 mg/dL — ABNORMAL HIGH (ref 70–99)
Glucose-Capillary: 100 mg/dL — ABNORMAL HIGH (ref 70–99)
Glucose-Capillary: 168 mg/dL — ABNORMAL HIGH (ref 70–99)

## 2024-01-22 LAB — URINE CULTURE: Culture: 70000 — AB

## 2024-01-22 LAB — LACTIC ACID, PLASMA
Lactic Acid, Venous: 4.4 mmol/L (ref 0.5–1.9)
Lactic Acid, Venous: 4.6 mmol/L (ref 0.5–1.9)

## 2024-01-22 LAB — BRAIN NATRIURETIC PEPTIDE: B Natriuretic Peptide: 81.6 pg/mL (ref 0.0–100.0)

## 2024-01-22 LAB — PROCALCITONIN: Procalcitonin: <0.1 ng/mL

## 2024-01-22 MED ORDER — APIXABAN 5 MG PO TABS
5.0000 mg | ORAL_TABLET | Freq: Two times a day (BID) | ORAL | Status: DC
  Administered 2024-01-22 – 2024-01-25 (×6): 5 mg via ORAL
  Filled 2024-01-22 (×6): qty 1

## 2024-01-22 MED ORDER — DICLOFENAC SODIUM 1 % EX GEL
4.0000 g | Freq: Four times a day (QID) | CUTANEOUS | Status: DC
  Administered 2024-01-23 – 2024-01-25 (×5): 4 g via TOPICAL
  Filled 2024-01-22: qty 100

## 2024-01-22 MED ORDER — ACETAMINOPHEN 325 MG PO TABS
650.0000 mg | ORAL_TABLET | Freq: Four times a day (QID) | ORAL | Status: DC | PRN
  Administered 2024-01-23 – 2024-01-24 (×2): 650 mg via ORAL
  Filled 2024-01-22 (×2): qty 2

## 2024-01-22 MED ORDER — LACTATED RINGERS IV SOLN: INTRAVENOUS | Status: AC

## 2024-01-22 MED ORDER — LACTATED RINGERS IV BOLUS
1000.0000 mL | Freq: Once | INTRAVENOUS | Status: AC
  Administered 2024-01-22: 1000 mL via INTRAVENOUS

## 2024-01-22 MED ORDER — VITAMIN D 25 MCG (1000 UNIT) PO TABS
2000.0000 [IU] | ORAL_TABLET | Freq: Every day | ORAL | Status: DC
  Administered 2024-01-23 – 2024-01-25 (×3): 2000 [IU] via ORAL
  Filled 2024-01-22 (×3): qty 2

## 2024-01-22 MED ORDER — INSULIN GLARGINE-YFGN 100 UNIT/ML ~~LOC~~ SOLN
5.0000 [IU] | Freq: Every day | SUBCUTANEOUS | Status: DC
  Administered 2024-01-22 – 2024-01-24 (×3): 5 [IU] via SUBCUTANEOUS
  Filled 2024-01-22 (×5): qty 0.05

## 2024-01-22 MED ORDER — INSULIN DEGLUDEC 100 UNIT/ML ~~LOC~~ SOPN: 5.0000 [IU] | PEN_INJECTOR | Freq: Every day | SUBCUTANEOUS | Status: DC

## 2024-01-22 MED ORDER — LACTATED RINGERS IV BOLUS (SEPSIS)
250.0000 mL | Freq: Once | INTRAVENOUS | Status: AC
  Administered 2024-01-22: 250 mL via INTRAVENOUS

## 2024-01-22 MED ORDER — METOPROLOL TARTRATE 5 MG/5ML IV SOLN: 5.0000 mg | Freq: Four times a day (QID) | INTRAVENOUS | Status: DC | PRN

## 2024-01-22 MED ORDER — LACTATED RINGERS IV BOLUS
500.0000 mL | Freq: Once | INTRAVENOUS | Status: AC
  Administered 2024-01-22: 500 mL via INTRAVENOUS

## 2024-01-22 MED ORDER — SODIUM CHLORIDE 0.9 % IV SOLN
2.0000 g | Freq: Two times a day (BID) | INTRAVENOUS | Status: DC
  Administered 2024-01-23 (×2): 2 g via INTRAVENOUS
  Filled 2024-01-22 (×4): qty 12.5

## 2024-01-22 MED ORDER — POLYETHYLENE GLYCOL 3350 17 G PO PACK: 17.0000 g | PACK | Freq: Every day | ORAL | Status: DC | PRN

## 2024-01-22 MED ORDER — IBUPROFEN 400 MG PO TABS: 400.0000 mg | ORAL_TABLET | Freq: Four times a day (QID) | ORAL | Status: DC | PRN

## 2024-01-22 MED ORDER — ATORVASTATIN CALCIUM 20 MG PO TABS
20.0000 mg | ORAL_TABLET | Freq: Every day | ORAL | Status: DC
  Administered 2024-01-23 – 2024-01-25 (×3): 20 mg via ORAL
  Filled 2024-01-22 (×3): qty 1

## 2024-01-22 MED ORDER — GUAIFENESIN 100 MG/5ML PO LIQD: 10.0000 mL | ORAL | Status: DC | PRN

## 2024-01-22 MED ORDER — LEVETIRACETAM 500 MG PO TABS
500.0000 mg | ORAL_TABLET | Freq: Two times a day (BID) | ORAL | Status: DC
  Administered 2024-01-22 – 2024-01-25 (×6): 500 mg via ORAL
  Filled 2024-01-22 (×6): qty 1

## 2024-01-22 MED ORDER — SODIUM CHLORIDE 0.9 % IV SOLN
2.0000 g | Freq: Once | INTRAVENOUS | Status: AC
  Administered 2024-01-22: 2 g via INTRAVENOUS
  Filled 2024-01-22: qty 12.5

## 2024-01-22 NOTE — ED Notes (Addendum)
 Per lab, BNP to be added to previously drawn blood

## 2024-01-22 NOTE — ED Notes (Signed)
 Lab notified requesting they come to get second set of blood cultures

## 2024-01-22 NOTE — ED Notes (Signed)
 Per provider infuse 1L of fluids prior to repeat lactic acid

## 2024-01-22 NOTE — ED Notes (Signed)
Patient sitting up in bed. Eating lunch.

## 2024-01-22 NOTE — Assessment & Plan Note (Signed)
 Continue Keppra

## 2024-01-22 NOTE — Assessment & Plan Note (Signed)
 Wound care

## 2024-01-22 NOTE — H&P (Signed)
 History and Physical    Patient: [name] FMW:[id number] DOB: 1946-04-22 DOA: 01/19/2024 DOS: the patient was seen and examined on 01/22/2024 PCP: Andi Jointer, DO  Patient coming from: SNF  Chief Complaint:  Chief Complaint  Patient presents with   Leg Pain   HPI: [name] is a 78 y.o. female with medical history significant of  CVA, right hemiparesis, chronic wound infection, HLD, seizure disorder, CHFpEF who had been in the SNF that they were not taking appropriate care of her wound.  Her son checked her out of their and brought her to the ED because he did not have all the supplies he needed to take care of her at home including hospital bed.  She been in the ED for several days awaiting equipment and placement.  On initial check and she had some urinary retention a Foley was placed and urine culture was sent at that time.  The patient was started on Bactrim  for dirty urine.  Today she became increasingly tremulous and hypotensive.  Repeat labs showed an elevated white count of 13.3, lactic acid of 4.4, and some hypotension with blood pressure of 90s over 60s.  The patient's urine culture was growing Pseudomonas.  She was given IV fluid resuscitation code sepsis was called and she was started on cefepime .  We were called to admit. Review of Systems: As mentioned in the history of present illness. All other systems reviewed and are negative. Past Medical History:  Diagnosis Date   Acute blood loss anemia    CHF (congestive heart failure) (HCC)    EF 50-55%, grade 1 diastolic dysfunction per echo 89/7983   Coronary artery disease involving native artery of transplanted heart without angina pectoris    CVA (cerebral infarction) 04/2015   Started Plavix  04/2015   Depression    Diabetes (HCC) 2016   Type II. On insulin    Enchondroma of bone 2011   left femur.    Fracture, intertrochanteric, right femur (HCC)    History of lower GI bleeding    Hyperlipidemia     Hypertension    Patent foramen ovale 04/2015   Started on warfarin 04/2015   Protein calorie malnutrition (HCC)    Stercoral ulcer of rectum 05/16/2015   Past Surgical History:  Procedure Laterality Date   CARDIAC SURGERY     Cath without stent   COLONOSCOPY N/A 05/15/2015   Procedure: COLONOSCOPY;  Surgeon: Gustav Shila GAILS, MD;  Location: [facility] ENDOSCOPY;  Service: Endoscopy;  Laterality: N/A;   FLEXIBLE SIGMOIDOSCOPY N/A 05/15/2015   Procedure: FLEXIBLE SIGMOIDOSCOPY;  Surgeon: Gustav Shila GAILS, MD;  Location: [facility] ENDOSCOPY;  Service: Endoscopy;  Laterality: N/A;  at bedside   INTRAMEDULLARY (IM) NAIL INTERTROCHANTERIC Right 06/12/2015   Procedure: INTRAMEDULLARY (IM) NAIL RIGHT HIP;  Surgeon: Evalene JONETTA Chancy, MD;  Location: [facility] OR;  Service: Orthopedics;  Laterality: Right;   TEE WITHOUT CARDIOVERSION N/A 05/05/2015   Procedure: TRANSESOPHAGEAL ECHOCARDIOGRAM (TEE);  Surgeon: Salena Negri, MD;  Location: [facility] ENDOSCOPY;  Service: Cardiovascular;  Laterality: N/A;   Social History:  reports that she quit smoking about 8 years ago. Her smoking use included cigarettes. She has never used smokeless tobacco. She reports that she does not drink alcohol and does not use drugs.  Allergies  Allergen Reactions   Pork-Derived Products Other (See Comments)    To keep blood pressure down    Family History  Problem Relation Age of Onset   Hypertension Mother    Heart failure Mother  Hyperlipidemia Mother    Heart attack Father     Prior to Admission medications   Medication Sig Start Date End Date Taking? Authorizing Provider  acetaminophen  (TYLENOL ) 325 MG tablet Take 650 mg by mouth every 6 (six) hours as needed.   Yes [provider]  atorvastatin  (LIPITOR) 20 MG tablet Take 20 mg by mouth daily. 01/12/24  Yes [provider]  Cholecalciferol  (VITAMIN D3) 50 MCG (2000 UT) TABS Take 1 tablet by mouth daily.   Yes [provider]  diclofenac  Sodium (VOLTAREN ) 1  % GEL Apply 4 g topically 4 (four) times daily. Patient taking differently: Apply 4 g topically 4 (four) times daily. APPLY 4 GRAMS TOPICALLY TO EACH KNEE FOUR TIMES DAILY. 10/21/22  Yes Tobie Yetta HERO, MD  ELIQUIS  5 MG TABS tablet Take 5 mg by mouth every 12 (twelve) hours. 12/30/19  Yes [provider]  guaiFENesin  (ROBITUSSIN) 100 MG/5ML liquid Take 10 mLs by mouth every 4 (four) hours as needed for cough or to loosen phlegm.   Yes [provider]  levETIRAcetam  (KEPPRA ) 500 MG tablet Take 1 tablet (500 mg total) by mouth 2 (two) times daily. Patient taking differently: Take 500 mg by mouth every 12 (twelve) hours. 03/31/22  Yes Samtani, Jai-Gurmukh, MD  losartan  (COZAAR ) 25 MG tablet Take 12.5 mg by mouth daily.  12/30/19  Yes [provider]  metoprolol  succinate (TOPROL -XL) 25 MG 24 hr tablet Take 25 mg by mouth daily.  12/30/19  Yes [provider]  polyethylene glycol (MIRALAX  / GLYCOLAX ) 17 g packet Take 17 g by mouth daily as needed for mild constipation or moderate constipation. Use Miralax  if Milk of Magnesia is contraindicated (Example: Dialysis resident)   Yes [provider]  TRESIBA  FLEXTOUCH 100 UNIT/ML FlexTouch Pen Inject 10 Units into the skin at bedtime. Patient taking differently: Inject 5 Units into the skin at bedtime. 10/21/22  Yes Tobie Yetta HERO, MD    Physical Exam: Vitals:   01/22/24 0916 01/22/24 1900 01/22/24 1930 01/22/24 2030  BP: 94/65 110/77 113/68 138/74  Pulse: 60 88 83 80  Resp:   18 18  Temp:      TempSrc:      SpO2:  100% 100% 100%  Weight:      Height:       Physical Examination: General appearance - alert, well appearing, and in no distress Chest - clear to auscultation, no wheezes, rales or rhonchi, symmetric air entry Heart - normal rate, regular rhythm, normal S1, S2, no murmurs, rubs, clicks or gallops Abdomen - soft, nontender, nondistended, no masses or organomegaly Extremities - peripheral pulses  normal, no pedal edema, no clubbing or cyanosis  Data Reviewed: Results for orders placed or performed during the hospital encounter of 01/19/24 (from the past 24 hours)  CBG monitoring, ED     Status: Abnormal   Collection Time: 01/22/24  7:38 AM  Result Value Ref Range   Glucose-Capillary 172 (H) 70 - 99 mg/dL  CBG monitoring, ED     Status: Abnormal   Collection Time: 01/22/24 11:42 AM  Result Value Ref Range   Glucose-Capillary 149 (H) 70 - 99 mg/dL  CBG monitoring, ED     Status: Abnormal   Collection Time: 01/22/24  5:16 PM  Result Value Ref Range   Glucose-Capillary 100 (H) 70 - 99 mg/dL  CBC with Differential     Status: Abnormal   Collection Time: 01/22/24  6:54 PM  Result Value Ref Range  WBC 13.3 (H) 4.0 - 10.5 K/uL   RBC 4.78 3.87 - 5.11 MIL/uL   Hemoglobin 13.2 12.0 - 15.0 g/dL   HCT 57.9 63.9 - 53.9 %   MCV 87.9 80.0 - 100.0 fL   MCH 27.6 26.0 - 34.0 pg   MCHC 31.4 30.0 - 36.0 g/dL   RDW 86.8 88.4 - 84.4 %   Platelets 260 150 - 400 K/uL   nRBC 0.0 0.0 - 0.2 %   Neutrophils Relative % 73 %   Neutro Abs 9.7 (H) 1.7 - 7.7 K/uL   Lymphocytes Relative 19 %   Lymphs Abs 2.5 0.7 - 4.0 K/uL   Monocytes Relative 6 %   Monocytes Absolute 0.8 0.1 - 1.0 K/uL   Eosinophils Relative 1 %   Eosinophils Absolute 0.1 0.0 - 0.5 K/uL   Basophils Relative 0 %   Basophils Absolute 0.1 0.0 - 0.1 K/uL   Immature Granulocytes 1 %   Abs Immature Granulocytes 0.07 0.00 - 0.07 K/uL  Basic metabolic panel     Status: Abnormal   Collection Time: 01/22/24  6:54 PM  Result Value Ref Range   Sodium 140 135 - 145 mmol/L   Potassium 4.7 3.5 - 5.1 mmol/L   Chloride 104 98 - 111 mmol/L   CO2 24 22 - 32 mmol/L   Glucose, Bld 188 (H) 70 - 99 mg/dL   BUN 10 8 - 23 mg/dL   Creatinine, Ser 9.03 0.44 - 1.00 mg/dL   Calcium  9.4 8.9 - 10.3 mg/dL   GFR, Estimated >39 >39 mL/min   Anion gap 12 5 - 15  Lactic acid, plasma     Status: Abnormal   Collection Time: 01/22/24  6:54 PM  Result Value  Ref Range   Lactic Acid, Venous 4.4 (HH) 0.5 - 1.9 mmol/L  Procalcitonin     Status: None   Collection Time: 01/22/24  6:54 PM  Result Value Ref Range   Procalcitonin <0.10 ng/mL  Brain natriuretic peptide     Status: None   Collection Time: 01/22/24  6:54 PM  Result Value Ref Range   B Natriuretic Peptide 81.6 0.0 - 100.0 pg/mL  Protime-INR     Status: Abnormal   Collection Time: 01/22/24  6:54 PM  Result Value Ref Range   Prothrombin Time 15.9 (H) 11.4 - 15.2 seconds   INR 1.2 0.8 - 1.2  Lactic acid, plasma     Status: Abnormal   Collection Time: 01/22/24  9:26 PM  Result Value Ref Range   Lactic Acid, Venous 4.6 (HH) 0.5 - 1.9 mmol/L   DG Chest Port 1 View Result Date: 01/19/2024 CLINICAL DATA:  78 year old female with cough. EXAM: PORTABLE CHEST 1 VIEW COMPARISON:  Portable chest 10/15/2022 and earlier. FINDINGS: Portable AP semi upright view at 0352 hours. Low lung volumes similar to 2024. Calcified aortic atherosclerosis. Mild cardiomegaly. Small gastric hiatal hernia. Other mediastinal contours are within normal limits. Visualized tracheal air column is within normal limits. Allowing for portable technique the lungs are clear. No pneumothorax or pleural effusion. Negative visible bowel gas. No acute osseous abnormality identified. IMPRESSION: 1. Low lung volumes, no acute cardiopulmonary abnormality. 2. Small chronic hiatal hernia. Aortic Atherosclerosis (ICD10-I70.0). Electronically Signed   By: VEAR Hurst M.D.   On: 01/19/2024 04:15     Assessment and Plan: * Sepsis (HCC) Secondary to urosepsis Patient with lactic acid of 4.4 Patient with initially low blood pressures in the 90s over 40s which responded well to IV fluid  resuscitation. Continue IV fluids Continue cefepime    UTI (urinary tract infection) With Pseudomonas aeruginosa, resistant to Bactrim  On cefepime  IV Has indwelling Foley catheter, this was inserted due to urinary retention I will need to consider  removal.  Type 2 diabetes mellitus (HCC) Continue Lantus  and NovoLog  Sliding scale insulin   Hypertension Holding metoprolol  and Cozaar  given low blood pressures with sepsis.  History of stroke Resulting right hemiparesis On Eliquis   Right spastic hemiparesis (HCC) Secondary to CVA Patient is bedbound at baseline Will need return to SNF  Partial symptomatic epilepsy with complex partial seizures, not intractable, without status epilepticus (HCC) Continue Keppra   Acute urinary retention Unclear etiology Foley placed in ED Consider removal in the next 24 hours.  HLD (hyperlipidemia) Continue statin  Grade I diastolic dysfunction Appears euvolemic at present  Skin ulcer of right heel, limited to breakdown of skin [facility]) Wound care      Advance Care Planning:   Code Status: Prior full  Consults: None  Family Communication: Patient at bedside  Severity of Illness: The appropriate patient status for this patient is INPATIENT. Inpatient status is judged to be reasonable and necessary in order to provide the required intensity of service to ensure the patient's safety. The patient's presenting symptoms, physical exam findings, and initial radiographic and laboratory data in the context of their chronic comorbidities is felt to place them at high risk for further clinical deterioration. Furthermore, it is not anticipated that the patient will be medically stable for discharge from the hospital within 2 midnights of admission.   * I certify that at the point of admission it is my clinical judgment that the patient will require inpatient hospital care spanning beyond 2 midnights from the point of admission due to high intensity of service, high risk for further deterioration and high frequency of surveillance required.*  Author: Glenys GORMAN Birk, MD 01/22/2024 8:54 PM  For on call review [email or web address].

## 2024-01-22 NOTE — Assessment & Plan Note (Signed)
 Continue statin.

## 2024-01-22 NOTE — ED Notes (Signed)
 Pt positioned on left side with pillow wedge

## 2024-01-22 NOTE — ED Notes (Signed)
 Staff on 2A notified that pt is on the way up to her room

## 2024-01-22 NOTE — Progress Notes (Signed)
 CODE SEPSIS - PHARMACY COMMUNICATION  **Broad Spectrum Antibiotics should be administered within 1 hour of Sepsis diagnosis**  Time Code Sepsis Called/Page Received: 19:56  Antibiotics Ordered: Cefepime   Time of 1st antibiotic administration: 20:07  Additional action taken by pharmacy: n/a  If necessary, Name of Provider/Nurse Contacted: n/a    Olam KANDICE Fritter ,PharmD Clinical Pharmacist  01/22/2024  8:29 PM

## 2024-01-22 NOTE — ED Provider Notes (Signed)
 [facility] Provider Note    Event Date/Time   First MD Initiated Contact with Patient 01/19/24 [phone]     (approximate)   History   Leg Pain   HPI  [name] is a 78 y.o. female  with CVA, prior chronic wound who initially presented to our ED on 01/19/24 from SNF.  Please see prior ED physicians HPI.  Patient has been waiting in our emergency department for transition of care  Patient's son at bedside is concerned that patient has been more tremulous over the past 48 hours but has no new focal deficits      Physical Exam   Triage Vital Signs: ED Triage Vitals  Encounter Vitals Group     BP 01/18/24 2034 120/86     Girls Systolic BP Percentile --      Girls Diastolic BP Percentile --      Boys Systolic BP Percentile --      Boys Diastolic BP Percentile --      Pulse Rate 01/18/24 2034 76     Resp 01/18/24 2034 20     Temp 01/18/24 2034 98.5 F (36.9 C)     Temp Source 01/18/24 2034 Oral     SpO2 01/18/24 2034 100 %     Weight 01/18/24 2039 149 lb 14.6 oz (68 kg)     Height 01/18/24 2039 5' 7 (1.702 m)     Head Circumference --      Peak Flow --      Pain Score 01/18/24 2038 0     Pain Loc --      Pain Education --      Exclude from Growth Chart --     Most recent vital signs: Vitals:   01/22/24 0915 01/22/24 0916  BP: 94/65 94/65  Pulse: 60 60  Resp: 15   Temp: 97.9 F (36.6 C)   SpO2: 93%     Nursing Triage Note reviewed. Vital signs reviewed and patients oxygen saturation is normoxic  General: Patient is well nourished, well developed, awake and alert,tremulous with movement  CV:                  Good peripheral perfusion.  Resp:               Normal effort.  Abd:                 No distention.  Neuro  Right sided weakness chronic per son and patient report from old stroke.  Soft nontender abdomen deep palpation all quadrants.  .  Wound to the right lower extremity is wrapped.   Patient has a Foley in place draining  urine ED Results / Procedures / Treatments   Labs (all labs ordered are listed, but only abnormal results are displayed) Labs Reviewed  URINE CULTURE - Abnormal; Notable for the following components:      Result Value   Culture 70,000 COLONIES/mL PSEUDOMONAS AERUGINOSA (*)    Organism ID, Bacteria PSEUDOMONAS AERUGINOSA (*)    All other components within normal limits  URINALYSIS, W/ REFLEX TO CULTURE (INFECTION SUSPECTED) - Abnormal; Notable for the following components:   Color, Urine YELLOW (*)    APPearance CLOUDY (*)    Glucose, UA >=500 (*)    Hgb urine dipstick SMALL (*)    Protein, ur 30 (*)    Nitrite POSITIVE (*)    Leukocytes,Ua LARGE (*)    Bacteria, UA RARE (*)  All other components within normal limits  CBC WITH DIFFERENTIAL/PLATELET - Abnormal; Notable for the following components:   WBC 13.3 (*)    Neutro Abs 9.7 (*)    All other components within normal limits  LACTIC ACID, PLASMA - Abnormal; Notable for the following components:   Lactic Acid, Venous 4.4 (*)    All other components within normal limits  CBG MONITORING, ED - Abnormal; Notable for the following components:   Glucose-Capillary 253 (*)    All other components within normal limits  CBG MONITORING, ED - Abnormal; Notable for the following components:   Glucose-Capillary 205 (*)    All other components within normal limits  CBG MONITORING, ED - Abnormal; Notable for the following components:   Glucose-Capillary 212 (*)    All other components within normal limits  CBG MONITORING, ED - Abnormal; Notable for the following components:   Glucose-Capillary 172 (*)    All other components within normal limits  CBG MONITORING, ED - Abnormal; Notable for the following components:   Glucose-Capillary 149 (*)    All other components within normal limits  CBG MONITORING, ED - Abnormal; Notable for the following components:   Glucose-Capillary 100 (*)    All other components within normal limits   CULTURE, BLOOD (ROUTINE X 2)  CULTURE, BLOOD (ROUTINE X 2)  BASIC METABOLIC PANEL WITH GFR  LACTIC ACID, PLASMA  PROCALCITONIN  BRAIN NATRIURETIC PEPTIDE  CBG MONITORING, ED     PROCEDURES:  Critical Care performed: Yes  .Critical Care  Performed by: Nicholaus Rolland BRAVO, MD Authorized by: Nicholaus Rolland BRAVO, MD   Critical care provider statement:    Critical care time (minutes):  35   Critical care was necessary to treat or prevent imminent or life-threatening deterioration of the following conditions:  Sepsis   Critical care was time spent personally by me on the following activities:  Development of treatment plan with patient or surrogate, discussions with consultants, evaluation of patient's response to treatment, examination of patient, ordering and review of laboratory studies, ordering and review of radiographic studies, ordering and performing treatments and interventions, pulse oximetry, re-evaluation of patient's condition, review of old charts, blood draw for specimens and discussions with primary provider   Care discussed with: admitting provider   Comments:     Code sepsis, given antibiotics, and broad spectrum antibiotics    MEDICATIONS ORDERED IN ED: Medications  atorvastatin  (LIPITOR) tablet 20 mg (20 mg Oral Given 01/22/24 0917)  apixaban  (ELIQUIS ) tablet 5 mg (5 mg Oral Given 01/22/24 0917)  metoprolol  succinate (TOPROL -XL) 24 hr tablet 25 mg (0 mg Oral Hold 01/22/24 0916)  levETIRAcetam  (KEPPRA ) tablet 500 mg (500 mg Oral Given 01/22/24 0917)  sulfamethoxazole -trimethoprim  (BACTRIM  DS) 800-160 MG per tablet 1 tablet (1 tablet Oral Given 01/22/24 0917)  insulin  aspart (novoLOG ) injection 4 Units (0 Units Subcutaneous Hold 01/22/24 1738)  insulin  aspart (novoLOG ) injection 0-15 Units (0 Units Subcutaneous Hold 01/22/24 1738)  insulin  aspart (novoLOG ) injection 0-5 Units (2 Units Subcutaneous Given 01/21/24 2144)  ceFEPIme  (MAXIPIME ) 2 g in sodium chloride  0.9 % 100 mL IVPB  (has no administration in time range)  lactated ringers  bolus 1,000 mL (has no administration in time range)  lactated ringers  infusion (has no administration in time range)  lactated ringers  bolus 250 mL (has no administration in time range)  lactated ringers  bolus 500 mL (500 mLs Intravenous New Bag/Given 01/22/24 1947)     IMPRESSION / MDM / ASSESSMENT AND PLAN / ED COURSE  Ddx: sepsis, UTI, pseudomonas, acute renal insuffiency  Patient has been awaiting transitions of care since 7/17.  She has been treated for a UTI on Bactrim .  Unfortunately over the past 48 hours she has become more tremulous with some soft blood pressures to the systolic 90s.  Urine culture this morning grew back Pseudomonas.  Given age and history would want to avoid all fluoroquinolones in this patient population.  Case discussed with pharmacy and no good outpatient antibiotics.  Will initiate the patient on cefepime  and discussed admission with the hospitalist.  Patient might require an infectious disease consult.  Of note it was relayed to me that we inserted the Foley on patient ED arrival.  Patient's son at bedside in agreement with this plan  Admitting Diagnoses: UTI, Sepsis, chronic wound  Clinical Course as of 01/22/24 1958  Thu Jan 19, 2024  02/22/1234 Urinalysis, w/ Reflex to Culture (Infection Suspected) -Urine, Clean Catch(!) UA is consistent with UTI. [TT]  Sun Jan 22, 2024  1741/02/22 Urine Culture(!) [HD]  1743 Organism ID, Bacteria(!): PSEUDOMONAS AERUGINOSA [HD]  1743 Notified by nursing regarding an abnormal urine culture:  CEFEPIME  4 SENSITIVE Sensitive    CEFTAZIDIME 2 SENSITIVE Sensitive   CIPROFLOXACIN  <=0.25 SENS... Sensitive   GENTAMICIN <=1 SENSITIVE Sensitive   IMIPENEM 2 SENSITIVE Sensitive     [HD]  1752 Organism ID, Bacteria(!): PSEUDOMONAS AERUGINOSA [HD]  1757 Discussed with pharmacy and given Pseudomonas UTI and no good options in this age and would want to  avoid fluoroquinolones, will do IV cefepime .  Will obtain repeat labs especially since patient's blood pressure is starting to get soft) for admission [HD]  1810 Son at bedside and updated him of results and likely plan for admission [HD]  1931 WBC(!): 13.3 Increasing wbc [HD]  1953 Lactic Acid, Venous(!!): 4.4 C/w sepsis [HD]  1956 Presentation consistent with sepsis.  Code sepsis activated.  Case called into the hospitalist for admission [HD]    Clinical Course User Index [HD] Nicholaus Rolland BRAVO, MD [TT] Waymond Lorelle Cummins, MD   Data(2/3 categories following were performed): I reviewed or ordered at least three unique tests, external notes, and/or the history required an independent historian as one of the three requirements as following: At least 3 labs/imaging studies were obtained and/or reviewed. AND  I discussed the management of the patient with the following external physician or qualified healthcare provider: Admitting physician  Risk: This patient has a high risk of morbidity due to further diagnostic testing or treatment. Rationale: Decision made regarding admission  Admit Level 5 - Labs/Rads, Admit, Consult:  Suggested E/M Coding Level: 5, 99285  This level has been selected based on the February 22, 2022 CPT guidelines for E/M codes in the Emergency Department based on 2/3 of the CoPA, Data, and Risk.   FINAL CLINICAL IMPRESSION(S) / ED DIAGNOSES   Final diagnoses:  Social problem  H/O open leg wound  Other fatigue  Urinary retention  Urinary tract infection without hematuria, site unspecified  Urinary tract infection due to Pseudomonas aeruginosa  Sepsis due to Pseudomonas species without acute organ dysfunction (HCC)     Rx / DC Orders   ED Discharge Orders     None        Note:  This document was prepared using Dragon voice recognition software and may include unintentional dictation errors.   Nicholaus Rolland BRAVO, MD 01/22/24 02-22-09

## 2024-01-22 NOTE — ED Provider Notes (Signed)
   [facility] Observation Note   ----------------------------------------- 3:21 PM on 01/22/2024 -----------------------------------------  [name] is a 78 y.o. female currently boarding in the Emergency Department.  No acute events since last update.  Recent Vitals   Most recent vital signs: Vitals:   01/22/24 0915 01/22/24 0916  BP: 94/65 94/65  Pulse: 60 60  Resp: 15   Temp: 97.9 F (36.6 C)   SpO2: 93%     ED Results / Procedures / Treatments   Labs (all labs ordered are listed, but only abnormal results are displayed) Labs Reviewed  URINE CULTURE - Abnormal; Notable for the following components:      Result Value   Culture 70,000 COLONIES/mL PSEUDOMONAS AERUGINOSA (*)    Organism ID, Bacteria PSEUDOMONAS AERUGINOSA (*)    All other components within normal limits  URINALYSIS, W/ REFLEX TO CULTURE (INFECTION SUSPECTED) - Abnormal; Notable for the following components:   Color, Urine YELLOW (*)    APPearance CLOUDY (*)    Glucose, UA >=500 (*)    Hgb urine dipstick SMALL (*)    Protein, ur 30 (*)    Nitrite POSITIVE (*)    Leukocytes,Ua LARGE (*)    Bacteria, UA RARE (*)    All other components within normal limits  CBG MONITORING, ED - Abnormal; Notable for the following components:   Glucose-Capillary 253 (*)    All other components within normal limits  CBG MONITORING, ED - Abnormal; Notable for the following components:   Glucose-Capillary 205 (*)    All other components within normal limits  CBG MONITORING, ED - Abnormal; Notable for the following components:   Glucose-Capillary 212 (*)    All other components within normal limits  CBG MONITORING, ED - Abnormal; Notable for the following components:   Glucose-Capillary 172 (*)    All other components within normal limits  CBG MONITORING, ED - Abnormal; Notable for the following components:   Glucose-Capillary 149 (*)    All other components within normal limits  CBG  MONITORING, ED    MEDICATIONS ORDERED IN ED: Medications  atorvastatin  (LIPITOR) tablet 20 mg (20 mg Oral Given 01/22/24 0917)  apixaban  (ELIQUIS ) tablet 5 mg (5 mg Oral Given 01/22/24 0917)  metoprolol  succinate (TOPROL -XL) 24 hr tablet 25 mg (0 mg Oral Hold 01/22/24 0916)  levETIRAcetam  (KEPPRA ) tablet 500 mg (500 mg Oral Given 01/22/24 0917)  sulfamethoxazole -trimethoprim  (BACTRIM  DS) 800-160 MG per tablet 1 tablet (1 tablet Oral Given 01/22/24 0917)  insulin  aspart (novoLOG ) injection 4 Units (4 Units Subcutaneous Given 01/22/24 1204)  insulin  aspart (novoLOG ) injection 0-15 Units (2 Units Subcutaneous Given 01/22/24 1204)  insulin  aspart (novoLOG ) injection 0-5 Units (2 Units Subcutaneous Given 01/21/24 2144)     ED Plan   Currently awaiting placement into an appropriate living facility.  Social work is working with the patient to help achieve this.     Dorothyann Drivers, MD 01/22/24 [phone]

## 2024-01-22 NOTE — Assessment & Plan Note (Signed)
 With Pseudomonas aeruginosa, resistant to Bactrim  On cefepime  IV Has indwelling Foley catheter, this was inserted due to urinary retention I will need to consider removal.

## 2024-01-22 NOTE — Assessment & Plan Note (Signed)
 Continue Lantus  and NovoLog  Sliding scale insulin 

## 2024-01-22 NOTE — ED Notes (Signed)
 This RN and Waddell, RN gave patient a bed bath at this time. Gown, linens, and brief were changed the patient was moved to a hospital bed for comfort. Bed low and locked and call bell in reach.

## 2024-01-22 NOTE — ED Notes (Signed)
 Nicholaus, MD aware of urine culture. MD also made aware of family's concern. MD to go in and speak with patient.

## 2024-01-22 NOTE — ED Notes (Signed)
 Sat patient up to eat breakfast. Repositioned patient with assistance from 2nd RN.

## 2024-01-22 NOTE — Assessment & Plan Note (Signed)
 Unclear etiology Foley placed in ED Consider removal in the next 24 hours.

## 2024-01-22 NOTE — Hospital Course (Signed)
 Patient is a 78 year old with history of CVA, right hemiparesis, chronic wound infection, HLD, seizure disorder, CHFpEF who had been in the SNF that they were not taking appropriate care of her wound.  Her son checked her out of their and brought her to the ED because he did not have all the supplies he needed to take care of her at home including hospital bed.  She been in the ED for several days awaiting equipment and placement.  On initial check and she had some urinary retention a Foley was placed and urine culture was sent at that time.  The patient was started on Bactrim  for dirty urine.  Today she became increasingly tremulous and hypotensive.  Repeat labs showed an elevated white count of 13.3, lactic acid of 4.4, and some hypotension with blood pressure of 90s over 60s.  The patient's urine culture was growing Pseudomonas.  She was given IV fluid resuscitation code sepsis was called and she was started on cefepime .  We were called to admit.

## 2024-01-22 NOTE — Assessment & Plan Note (Signed)
 Resulting right hemiparesis On Eliquis 

## 2024-01-22 NOTE — Progress Notes (Signed)
 Pt being followed by [facility] for Sepsis protocol.

## 2024-01-22 NOTE — Assessment & Plan Note (Signed)
 Secondary to CVA Patient is bedbound at baseline Will need return to SNF

## 2024-01-22 NOTE — Assessment & Plan Note (Signed)
 Holding metoprolol  and Cozaar  given low blood pressures with sepsis.

## 2024-01-22 NOTE — Assessment & Plan Note (Signed)
 Appears euvolemic at present

## 2024-01-22 NOTE — Assessment & Plan Note (Signed)
 Secondary to urosepsis Patient with lactic acid of 4.4 Patient with initially low blood pressures in the 90s over 40s which responded well to IV fluid resuscitation. Continue IV fluids Continue cefepime 

## 2024-01-23 DIAGNOSIS — Z794 Long term (current) use of insulin: Secondary | ICD-10-CM

## 2024-01-23 DIAGNOSIS — L97411 Non-pressure chronic ulcer of right heel and midfoot limited to breakdown of skin: Secondary | ICD-10-CM

## 2024-01-23 DIAGNOSIS — G8111 Spastic hemiplegia affecting right dominant side: Secondary | ICD-10-CM | POA: Diagnosis not present

## 2024-01-23 DIAGNOSIS — N390 Urinary tract infection, site not specified: Secondary | ICD-10-CM

## 2024-01-23 DIAGNOSIS — E1159 Type 2 diabetes mellitus with other circulatory complications: Secondary | ICD-10-CM

## 2024-01-23 DIAGNOSIS — A4152 Sepsis due to Pseudomonas: Secondary | ICD-10-CM | POA: Diagnosis not present

## 2024-01-23 LAB — BASIC METABOLIC PANEL WITH GFR
Anion gap: 10 (ref 5–15)
BUN: 10 mg/dL (ref 8–23)
CO2: 25 mmol/L (ref 22–32)
Calcium: 8.9 mg/dL (ref 8.9–10.3)
Chloride: 105 mmol/L (ref 98–111)
Creatinine, Ser: 0.81 mg/dL (ref 0.44–1.00)
GFR, Estimated: >60 mL/min (ref >60)
Glucose, Bld: 115 mg/dL — ABNORMAL HIGH (ref 70–99)
Potassium: 4.3 mmol/L (ref 3.5–5.1)
Sodium: 140 mmol/L (ref 135–145)

## 2024-01-23 LAB — GLUCOSE, CAPILLARY
Glucose-Capillary: 101 mg/dL — ABNORMAL HIGH (ref 70–99)
Glucose-Capillary: 163 mg/dL — ABNORMAL HIGH (ref 70–99)
Glucose-Capillary: 127 mg/dL — ABNORMAL HIGH (ref 70–99)
Glucose-Capillary: 208 mg/dL — ABNORMAL HIGH (ref 70–99)

## 2024-01-23 LAB — LACTIC ACID, PLASMA: Lactic Acid, Venous: 2.6 mmol/L (ref 0.5–1.9)

## 2024-01-23 LAB — CBC
HCT: 34.9 % — ABNORMAL LOW (ref 36.0–46.0)
Hemoglobin: 11 g/dL — ABNORMAL LOW (ref 12.0–15.0)
MCH: 28.1 pg (ref 26.0–34.0)
MCHC: 31.5 g/dL (ref 30.0–36.0)
MCV: 89.3 fL (ref 80.0–100.0)
Platelets: 202 K/uL (ref 150–400)
RBC: 3.91 MIL/uL (ref 3.87–5.11)
RDW: 13.2 % (ref 11.5–15.5)
WBC: 9.4 K/uL (ref 4.0–10.5)
nRBC: 0 % (ref 0.0–0.2)

## 2024-01-23 LAB — HEMOGLOBIN A1C
Hgb A1c MFr Bld: 8.7 % — ABNORMAL HIGH (ref 4.8–5.6)
Mean Plasma Glucose: 202.99 mg/dL

## 2024-01-23 MED ORDER — ORAL CARE MOUTH RINSE: 15.0000 mL | OROMUCOSAL | Status: DC | PRN

## 2024-01-23 NOTE — TOC Progression Note (Signed)
 Transition of Care [facility]) - Discharge Note   Patient Details  Name: [name] MRN: [id number] Date of Birth: August 10, 1945  [facility] of [facility]) CM/SW Contact:  [name], RN Phone Number: 01/23/2024, 4:03 PM   Clinical Narrative:    Spoke with patient's son regarding discharge. Patient son stated although his mother will be going to LTC his ultimate goal is for her to return home. He stated he was initially the her caregiver. He inquired about CAP hours. He has been advised he would need to follow up with DSS and patient's primary care provider at discharge from SNF.   Spoke with Shona from [employer]. Patient can be accepted tomorrow. MD notified.       Barriers to Discharge: ED Facility/Family Requesting Medication(s) Adjustment   Patient Goals and CMS Choice   CMS Medicare.gov Compare Post Acute Care list provided to:: Patient Represenative (must comment) (son- [name], will follow up regarding choice) Choice offered to / list presented to : Adult Children (son- [name], will follow up regarding choice)      Discharge Placement                       Discharge Plan and Services Additional resources added to the After Visit Summary for   In-house Referral: Clinical Social Work                                   Social Drivers of Health (SDOH) Interventions SDOH Screenings   Food Insecurity: No Food Insecurity (01/23/2024)  Housing: Low Risk  (01/23/2024)  Transportation Needs: No Transportation Needs (01/23/2024)  Utilities: Not At Risk (01/23/2024)  Depression (PHQ2-9): Low Risk  (02/10/2022)  Social Connections: Unknown (01/23/2024)  Tobacco Use: Medium Risk (01/18/2024)     Readmission Risk Interventions     No data to display

## 2024-01-23 NOTE — Progress Notes (Signed)
 [facility]  - [place]   PATIENT NAME: [name]    MR#:  [id number]  DATE OF BIRTH:  08/07/1945  SUBJECTIVE:  spoke with patient's son [name] on the phone. Patient came in from [facility] was in the ER couple few days found to have UTI and admitted for sepsis. Sepsis resolved. Patient improving. Has right-sided hemi paralysis which is chronic.    VITALS:  Blood pressure 127/66, pulse 71, temperature 97.8 F (36.6 C), resp. rate 18, height 5' 7 (1.702 m), weight 68 kg, SpO2 100%.  PHYSICAL EXAMINATION:   GENERAL:  78 y.o.-year-old patient with no acute distress.  LUNGS: Normal breath sounds bilaterally, no wheezing CARDIOVASCULAR: S1, S2 normal. No murmur   ABDOMEN: Soft, nontender, nondistended.  EXTREMITIES: right lower extremity   NEUROLOGIC: nonfocal  patient is alert chronic right-sided hemiparesis SKIN: as above  LABORATORY PANEL:  CBC Recent Labs  Lab 01/23/24 0454  WBC 9.4  HGB 11.0*  HCT 34.9*  PLT 202    Chemistries  Recent Labs  Lab 01/23/24 0454  NA 140  K 4.3  CL 105  CO2 25  GLUCOSE 115*  BUN 10  CREATININE 0.81  CALCIUM  8.9   Assessment and Plan  [name] is a 78 y.o. female with medical history significant of  CVA, right hemiparesis, chronic wound infection, HLD, seizure disorder, CHFpEF who had been in the SNF that they were not taking appropriate care of her wound   Form H and P--On initial check and she had some urinary retention a Foley was placed and urine culture was sent at that time. The patient was started on Bactrim  for dirty urine. Today she became increasingly tremulous and hypotensive. Repeat labs showed an elevated white count of 13.3, lactic acid of 4.4, and some hypotension with blood pressure of 90s over 60s. The patient's urine culture was growing Pseudomonas. She was given IV fluid resuscitation code sepsis was called and she was started on cefepime .   Sepsis due to UTI,  Pseudomonas -- lactic acid 4.6--- 2.6 -- received IV fluids -- sepsis improving. White count normal. No fever. Lactic acid down to 2.6. -- Change to oral antibiotic tomorrow -- discontinue Foley catheter  Type 2 diabetes mellitus (HCC) --Continue Lantus  and NovoLog  --Sliding scale insulin    Hypertension --Holding metoprolol  and Cozaar  given low blood pressures with sepsis.   History of stroke/Right spastic hemiparesis (HCC) --Resulting right hemiparesis --On Eliquis  --Patient is bedbound at baseline --Will need return to SNF   Partial symptomatic epilepsy with complex partial seizures, not intractable, without status epilepticus (HCC) --Continue Keppra    Acute urinary retention --Unclear etiology --Foley placed in ED--will remove it   HLD (hyperlipidemia) --Continue statin   Grade I diastolic dysfunction --Appears euvolemic at present   Skin ulcer of right heel, limited to breakdown of skin (HCC) --Wound care -- patient was last seen in the wound clinic on 15th July.   Procedures: Family communication : son [name]. Consults : none CODE STATUS: full DVT Prophylaxis : eliquis  Level of care: Med-Surg Status is: Inpatient Remains inpatient appropriate because: awaiting discharge planning to rehab. Discussed with son [name] he does not want patient to return to [facility].    TOTAL TIME TAKING CARE OF THIS PATIENT: 35 minutes.  >50% time spent on counselling and coordination of care  Note: This dictation was prepared with Dragon dictation along with smaller phrase technology. Any transcriptional errors that result from this process are  unintentional.  Leita Blanch M.D    [facility]   CC: Primary care physician; Simpson-Tarokh, Leann, DO

## 2024-01-23 NOTE — Plan of Care (Signed)
  Problem: Coping: Goal: Ability to adjust to condition or change in health will improve Outcome: Progressing   Problem: Health Behavior/Discharge Planning: Goal: Ability to identify and utilize available resources and services will improve Outcome: Progressing Goal: Ability to manage health-related needs will improve Outcome: Progressing   Problem: Skin Integrity: Goal: Risk for impaired skin integrity will decrease Outcome: Progressing   Problem: Tissue Perfusion: Goal: Adequacy of tissue perfusion will improve Outcome: Progressing

## 2024-01-23 NOTE — Progress Notes (Addendum)
 Physical Therapy Treatment Patient Details Name: [name] MRN: [id number] DOB: 1946-01-28 Today's Date: 01/23/2024   History of Present Illness [name] is a 78 y.o. female admitted for leg pain. Past medical history of prior CVA with right-sided deficits coming from a nursing facility with her son for request mainly for social work medical equipment for her home.    PT Comments  Patient resting in bed upon arrival to room; seen with OT for co-treat due to level of care required for mobility tasks.  Remains total assist +2 for rolling, repositioning and scooting up in bed; very limited ability to actively participate with functional tasks.  Does intermittently attempt to reach, grasp with L UE, but does require hand-over-hand assist for placement.  Baseline contractures noted to bilat ankles, R LE; difficult to fully assess due to generalized resistance (and poor tolerance) to all movement efforts.  Patient quick to scream and report pain; however, reaction not always in alignment with physical stimuli (I.e., screams with tickling sensation to plantar surface of feet).  Son in room initial portion of session--endorses that patient has been resident of [facility] for at least the previous year; uses Nurse, adult for all transfers, participating with OOB to chair 2-3x/week and requiring consistent physical assist from staff for all self-care tasks.  Voices long-term goal of having patient return to his home; has hospital bed, manual WC, hoyer lift and bedside table in the home already.  Denies additional equipment needs.  Given confirmation of PLOF, patient appears to be at functional baseline; no acute change in status, no acute PT needs identified at this time.  Recommend continued 24/7 total care at discharge with continued use of hoyer lift for all transfers.    Will discontinue initial PT order at this time; please re-consult should needs, functional status change.     If plan is  discharge home, recommend the following: Two people to help with walking and/or transfers;Two people to help with bathing/dressing/bathroom;Assist for transportation;Help with stairs or ramp for entrance;Supervision due to cognitive status   Can travel by private vehicle     No  Equipment Recommendations       Recommendations for Other Services       Precautions / Restrictions Precautions Precautions: Fall Recall of Precautions/Restrictions: Impaired Restrictions Weight Bearing Restrictions Per Provider Order: No     Mobility  Bed Mobility Overal bed mobility: Needs Assistance Bed Mobility: Rolling Rolling: Max assist, Total assist, +2 for physical assistance         General bed mobility comments: hand-over-hand for UE placement on rails; total assist for rotation of trunk and pelvis    Transfers                   General transfer comment: deferred; total care, hoyer lift at baseline    Ambulation/Gait               General Gait Details: deferred; total care, hoyer lift at baseline   Stairs             Wheelchair Mobility     Tilt Bed    Modified Rankin (Stroke Patients Only)       Balance                                            Communication Communication Communication: Impaired Factors  Affecting Communication: Difficulty expressing self;Reduced clarity of speech  Cognition Arousal: Alert Behavior During Therapy: Flat affect, Anxious   PT - Cognitive impairments: History of cognitive impairments Difficult to assess due to: Impaired communication                     PT - Cognition Comments: Patient oriented to self only; believed to be in nursing home        Cueing Cueing Techniques: Verbal cues, Tactile cues, Gestural cues  Exercises Other Exercises Other Exercises: Attempted bilat LE ROM--patient with baseline bilat ankle PF contractures; limited tolerance for passive ROM (R > L) to bilat  LEs. Other Exercises: Nursing at bedside for skin inspection during rolling and repositioning (mepilex changed) Other Exercises: Transitioned to chair position in bed for pulmonary hygiene,  pressure relief and pulmonary hygiene; patient tolerating without difficulty.  Foam wedge remains under R LE to offload R calf (per son's request)    General Comments General comments (skin integrity, edema, etc.): RN in room to assess skin integrity, RLE wrapped in ace bandage and positioned with foam pressure relief (son states calf wound on RLE is painful and created foam wedge to assist)      Pertinent Vitals/Pain Pain Assessment Pain Assessment: Faces Faces Pain Scale: Hurts whole lot Pain Location: patient quick to yell, scream with any stimulation (do question responsiveness to stimulation vs true pain indicator) Pain Descriptors / Indicators: Moaning, Grimacing, Guarding Pain Intervention(s): Limited activity within patient's tolerance, Monitored during session, Repositioned    Home Living                          Prior Function            PT Goals (current goals can now be found in the care plan section) Acute Rehab PT Goals Patient Stated Goal: unable to participate in goal formulation PT Goal Formulation: All assessment and education complete, DC therapy Progress towards PT goals: Not progressing toward goals - comment    Frequency    Other (Comment)      PT Plan      Co-evaluation   Reason for Co-Treatment: Necessary to address cognition/behavior during functional activity;For patient/therapist safety;To address functional/ADL transfers   OT goals addressed during session: ADL's and self-care      AM-PAC PT 6 Clicks Mobility   Outcome Measure  Help needed turning from your back to your side while in a flat bed without using bedrails?: Total Help needed moving from lying on your back to sitting on the side of a flat bed without using bedrails?: Total Help  needed moving to and from a bed to a chair (including a wheelchair)?: Total Help needed standing up from a chair using your arms (e.g., wheelchair or bedside chair)?: Total Help needed to walk in hospital room?: Total Help needed climbing 3-5 steps with a railing? : Total 6 Click Score: 6    End of Session   Activity Tolerance: Patient limited by pain Patient left: in bed;with call bell/phone within reach;with bed alarm set Nurse Communication: Mobility status PT Visit Diagnosis: Other abnormalities of gait and mobility (R26.89);Muscle weakness (generalized) (M62.81);Hemiplegia and hemiparesis     Time: 9042-8976 PT Time Calculation (min) (ACUTE ONLY): 26 min  Charges:    $Therapeutic Activity: 8-22 mins PT General Charges $$ ACUTE PT VISIT: 1 Visit  Immanuel Fedak H. Delores, PT, DPT, NCS 01/23/24, 3:32 PM [phone]

## 2024-01-23 NOTE — Progress Notes (Signed)
 Occupational Therapy Treatment Patient Details Name: [name] MRN: [id number] DOB: 12-Aug-1945 Today's Date: 01/23/2024   History of present illness [name] is a 78 y.o. female admitted for leg pain. Past medical history of prior CVA with right-sided deficits coming from a nursing facility with her son for request mainly for social work medical equipment for her home.   OT comments  Pt seen for OT/PT co-tx to maximize therapeutic outcomes and for pt/therapist safety. Pt alert to self only, son initially present in room. Son states pt is typically very sensitive to touch and cries out in pain. Denies additional DME or equipment needs for home. Pt cries out no frequently with all therapeutic efforts for PROM / positioning / pericare. Hemiplegic RUE painful with noted contracture in hand/wrist with digits flexed tightly around carrot wedge pillow and sensitive with attempts to further assess for skin integrity. Pt requires MAX - TOTAL A +2 for rolling (is able to reach with LUE for bed rail) for soiled brief to be removed and RN in room to assess skin integrity.   At baseline, pt is hoyer lift for transfers and requires MAX - TOTAL A for all ADLs. Pt appears to be at or near functional baseline. Discharge recommendations updated, secure chat to care team to update. Unfortunately, pt does not appear to have rehab potential at this time. Will need total care 24/7 with hoyer lift at discharge.       If plan is discharge home, recommend the following:  Two people to help with walking and/or transfers;Two people to help with bathing/dressing/bathroom;Direct supervision/assist for medications management;Supervision due to cognitive status;Direct supervision/assist for financial management;Assist for transportation;Assistance with cooking/housework;Assistance with feeding;Help with stairs or ramp for entrance   Equipment Recommendations  Other (comment)       Precautions / Restrictions  Precautions Precautions: Fall Recall of Precautions/Restrictions: Impaired Restrictions Weight Bearing Restrictions Per Provider Order: No       Mobility Bed Mobility Overal bed mobility: Needs Assistance Bed Mobility: Rolling Rolling: Max assist, +2 for physical assistance, Used rails         General bed mobility comments: pt reaches for rail with LUE; TOTAL A +2 overall    Transfers                   General transfer comment: NT (pt uses hoyer lift at baseline)     Balance Overall balance assessment: Needs assistance     Sitting balance - Comments: NT       Standing balance comment: NT                           ADL either performed or assessed with clinical judgement   ADL Overall ADL's : Needs assistance/impaired                             Toileting- Clothing Manipulation and Hygiene: Bed level;Total assistance Toileting - Clothing Manipulation Details (indicate cue type and reason): doff brief       General ADL Comments: anticipate continued max-total assist for all ADLs    Extremity/Trunk Assessment Upper Extremity Assessment RUE Deficits / Details: hx CVA, unable to fully assess 2/2 significant pain but notable contractures in the wrist and hand, pt with carrot/wedge pillow in hand to support finger extension RUE Coordination: decreased fine motor;decreased gross motor  Communication Communication Communication: Impaired Factors Affecting Communication: Difficulty expressing self;Reduced clarity of speech   Cognition Arousal: Alert Behavior During Therapy: Flat affect, Anxious               OT - Cognition Comments: very fearful/anxious of any attempts at repositioning of R hand                 Following commands: Intact        Cueing   Cueing Techniques: Verbal cues, Tactile cues, Gestural cues        General Comments RN in room to assess skin integrity, RLE wrapped in  ace bandage and positioned with foam pressure relief (son states calf wound on RLE is painful and created foam wedge to assist)    Pertinent Vitals/ Pain       Pain Assessment Pain Assessment: Faces Faces Pain Scale: Hurts whole lot Pain Location: BLE, RUE (son states she constantly yells and cries out with any attempt at tactile input) Pain Descriptors / Indicators: Moaning, Grimacing, Guarding Pain Intervention(s): Limited activity within patient's tolerance, Monitored during session, Repositioned, RN gave pain meds during session   Frequency  Min 1X/week        Progress Toward Goals  OT Goals(current goals can now be found in the care plan section)  Progress towards OT goals: OT to reassess next treatment  Acute Rehab OT Goals OT Goal Formulation: With patient/family Time For Goal Achievement: 02/03/24 Potential to Achieve Goals: Fair  Plan      Co-evaluation    PT/OT/SLP Co-Evaluation/Treatment: Yes Reason for Co-Treatment: Necessary to address cognition/behavior during functional activity;For patient/therapist safety;To address functional/ADL transfers   OT goals addressed during session: ADL's and self-care      AM-PAC OT 6 Clicks Daily Activity     Outcome Measure   Help from another person eating meals?: A Lot Help from another person taking care of personal grooming?: A Lot Help from another person toileting, which includes using toliet, bedpan, or urinal?: Total Help from another person bathing (including washing, rinsing, drying)?: Total Help from another person to put on and taking off regular upper body clothing?: Total Help from another person to put on and taking off regular lower body clothing?: Total 6 Click Score: 8    End of Session    OT Visit Diagnosis: Other abnormalities of gait and mobility (R26.89);Muscle weakness (generalized) (M62.81);Pain;Other symptoms and signs involving cognitive function Pain - Right/Left: Right Pain - part of  body: Hand;Leg;Knee;Ankle and joints of foot;Arm   Activity Tolerance Patient limited by pain   Patient Left in bed;with call bell/phone within reach;with bed alarm set   Nurse Communication Mobility status        Time: 9042-8974 OT Time Calculation (min): 28 min  Charges: OT Treatments $Self Care/Home Management : 8-22 mins  Ivry Pigue L. Blessing Ozga, OTR/L  01/23/24, 3:08 PM

## 2024-01-23 NOTE — Plan of Care (Signed)

## 2024-01-24 DIAGNOSIS — A4152 Sepsis due to Pseudomonas: Secondary | ICD-10-CM | POA: Diagnosis not present

## 2024-01-24 DIAGNOSIS — N390 Urinary tract infection, site not specified: Secondary | ICD-10-CM | POA: Diagnosis not present

## 2024-01-24 DIAGNOSIS — E1159 Type 2 diabetes mellitus with other circulatory complications: Secondary | ICD-10-CM | POA: Diagnosis not present

## 2024-01-24 DIAGNOSIS — G8111 Spastic hemiplegia affecting right dominant side: Secondary | ICD-10-CM | POA: Diagnosis not present

## 2024-01-24 LAB — GLUCOSE, CAPILLARY
Glucose-Capillary: 96 mg/dL (ref 70–99)
Glucose-Capillary: 116 mg/dL — ABNORMAL HIGH (ref 70–99)
Glucose-Capillary: 182 mg/dL — ABNORMAL HIGH (ref 70–99)
Glucose-Capillary: 134 mg/dL — ABNORMAL HIGH (ref 70–99)

## 2024-01-24 MED ORDER — METOPROLOL SUCCINATE ER 25 MG PO TB24
25.0000 mg | ORAL_TABLET | Freq: Every day | ORAL | Status: DC
  Administered 2024-01-24 – 2024-01-25 (×2): 25 mg via ORAL
  Filled 2024-01-24 (×2): qty 1

## 2024-01-24 MED ORDER — CIPROFLOXACIN HCL 500 MG PO TABS
500.0000 mg | ORAL_TABLET | Freq: Two times a day (BID) | ORAL | Status: DC
  Administered 2024-01-24 – 2024-01-25 (×3): 500 mg via ORAL
  Filled 2024-01-24 (×3): qty 1

## 2024-01-24 MED ORDER — LOSARTAN POTASSIUM 25 MG PO TABS
12.5000 mg | ORAL_TABLET | Freq: Every day | ORAL | Status: DC
  Administered 2024-01-24 – 2024-01-25 (×2): 12.5 mg via ORAL
  Filled 2024-01-24 (×2): qty 1

## 2024-01-24 NOTE — TOC Progression Note (Signed)
 Transition of Care [facility]) - Progression Note    Patient Details  Name: [name] MRN: [id number] Date of Birth: [birth year]  Transition of Care [facility]) CM/SW Contact  Tomasa JAYSON Childes, RN Phone Number: 01/24/2024, 9:00 AM  Clinical Narrative:    Beatris with Shona from [facility]. Shona stated shara would be required for this patient. She was advised shara would likely be denied due to patient being at baseline. She stated patient would have to come in as STR as facility has no LTC beds. Patient's medicaid can not be accepted.     Barriers to Discharge: ED Facility/Family Requesting Medication(s) Adjustment  Expected Discharge Plan and Services In-house Referral: Clinical Social Work     Living arrangements for the past 2 months: Single Family Home                                       Social Determinants of Health (SDOH) Interventions SDOH Screenings   Food Insecurity: No Food Insecurity (01/23/2024)  Housing: Low Risk  (01/23/2024)  Transportation Needs: No Transportation Needs (01/23/2024)  Utilities: Not At Risk (01/23/2024)  Depression (PHQ2-9): Low Risk  (02/10/2022)  Social Connections: Unknown (01/23/2024)  Tobacco Use: Medium Risk (01/18/2024)    Readmission Risk Interventions     No data to display

## 2024-01-24 NOTE — Progress Notes (Signed)
 [facility]  - [place] at [facility]   PATIENT NAME: [name]    MR#:  [id number]  DATE OF BIRTH:  1946-06-02  SUBJECTIVE:  spoke with patient's son [name] on the phone. Patient came in from [facility] was in the ER couple few days found to have UTI and admitted for sepsis. Sepsis resolved. Patient improving. Has right-sided hemi paralysis which is chronic.    VITALS:  Blood pressure 139/64, pulse 76, temperature 97.6 F (36.4 C), temperature source Oral, resp. rate 16, height 5' 7 (1.702 m), weight 68 kg, SpO2 100%.  PHYSICAL EXAMINATION:   GENERAL:  78 y.o.-year-old patient with no acute distress.  LUNGS: Normal breath sounds bilaterally, no wheezing CARDIOVASCULAR: S1, S2 normal. No murmur   ABDOMEN: Soft, nontender, nondistended.  EXTREMITIES: right lower extremity   NEUROLOGIC: nonfocal  patient is alert chronic right-sided hemiparesis SKIN: as above  LABORATORY PANEL:  CBC Recent Labs  Lab 01/23/24 0454  WBC 9.4  HGB 11.0*  HCT 34.9*  PLT 202    Chemistries  Recent Labs  Lab 01/23/24 0454  NA 140  K 4.3  CL 105  CO2 25  GLUCOSE 115*  BUN 10  CREATININE 0.81  CALCIUM  8.9   Assessment and Plan  [name] is a 78 y.o. female with medical history significant of  CVA, right hemiparesis, chronic wound infection, HLD, seizure disorder, CHFpEF who had been in the SNF that they were not taking appropriate care of her wound   From H and P--On initial check and she had some urinary retention a Foley was placed and urine culture was sent at that time. The patient was started on Bactrim  for dirty urine. Today she became increasingly tremulous and hypotensive. Repeat labs showed an elevated white count of 13.3, lactic acid of 4.4, and some hypotension with blood pressure of 90s over 60s. The patient's urine culture was growing Pseudomonas. She was given IV fluid resuscitation code sepsis was called and she was started on cefepime .    Sepsis due to UTI, Pseudomonas -- lactic acid 4.6--- 2.6 -- received IV fluids -- sepsis improving. White count normal. No fever. Lactic acid down to 2.6. -- Change to oral antibiotic today--ciprofloxacin  -- discontinue Foley catheter  Type 2 diabetes mellitus (HCC) --Continue Lantus  and NovoLog  --Sliding scale insulin    Hypertension --resumed metoprolol  and Cozaar     History of stroke/Right spastic hemiparesis (HCC) --Resulting right hemiparesis --On Eliquis  --Patient is bedbound at baseline   Partial symptomatic epilepsy with complex partial seizures, not intractable, without status epilepticus (HCC) --Continue Keppra    Acute urinary retention --Unclear etiology --foley d/ced   HLD (hyperlipidemia) --Continue statin   Grade I diastolic dysfunction --Appears euvolemic at present   Skin ulcer of right heel, limited to breakdown of skin (HCC) --Wound care -- patient was last seen in the wound clinic on 15th July.   Procedures: Family communication : son [name] on 7/22 Consults : none CODE STATUS: full DVT Prophylaxis : eliquis  Level of care: Med-Surg Status is: Inpatient Remains inpatient appropriate because: awaiting discharge planning to rehab. Discussed with son [name] he does not want patient to return to [facility].   Pt medically at baseline for d/c  TOTAL TIME TAKING CARE OF THIS PATIENT: 35 minutes.  >50% time spent on counselling and coordination of care  Note: This dictation was prepared with Dragon dictation along with smaller phrase technology. Any transcriptional errors that result from this process are unintentional.  [name]  Tobie M.D    [facility]   CC: Primary care physician; Simpson-Tarokh, Leann, DO

## 2024-01-24 NOTE — Plan of Care (Signed)
  Problem: Education: Goal: Ability to describe self-care measures that may prevent or decrease complications (Diabetes Survival Skills Education) will improve Outcome: Progressing Goal: Individualized Educational Video(s) Outcome: Progressing   Problem: Coping: Goal: Ability to adjust to condition or change in health will improve Outcome: Progressing   Problem: Metabolic: Goal: Ability to maintain appropriate glucose levels will improve Outcome: Progressing   Problem: Nutritional: Goal: Maintenance of adequate nutrition will improve Outcome: Progressing Goal: Progress toward achieving an optimal weight will improve Outcome: Progressing

## 2024-01-24 NOTE — Care Management Important Message (Signed)
 Important Message  Patient Details  Name: [name] MRN: [id number] Date of Birth: 1946-02-21   Important Message Given:  Yes - Medicare IM     Rojelio SHAUNNA Rattler 01/24/2024, 1:25 PM

## 2024-01-24 NOTE — TOC Progression Note (Signed)
 Transition of Care [facility]) - Progression Note    Patient Details  Name: [name] MRN: [id number] Date of Birth: Feb 06, 1946  Transition of [facility]) CM/SW Contact  Corean ONEIDA Haddock, RN Phone Number: 01/24/2024, 10:32 AM  Clinical Narrative:     - [facility] confirms they can not admit patient under LTC Medicaid - Debra at [facility] confirms that patient can return   Call placed to son [name].  I notified him that per MD patient is medically stable for discharge.  At this time the only confirm dc disposition options are [facility], or home.  [name] states they are not agreeable for patient to return to [facility], and do not have care in place for home at this time. He is agreeable for an extended bed search to determine if she can be placed at another facility under her LTC medicaid     Barriers to Discharge: ED Facility/Family Requesting Medication(s) Adjustment  Expected Discharge Plan and Services In-house Referral: Clinical Social Work     Living arrangements for the past 2 months: Single Family Home                                       Social Determinants of Health (SDOH) Interventions SDOH Screenings   Food Insecurity: No Food Insecurity (01/23/2024)  Housing: Low Risk  (01/23/2024)  Transportation Needs: No Transportation Needs (01/23/2024)  Utilities: Not At Risk (01/23/2024)  Depression (PHQ2-9): Low Risk  (02/10/2022)  Social Connections: Unknown (01/23/2024)  Tobacco Use: Medium Risk (01/18/2024)    Readmission Risk Interventions     No data to display

## 2024-01-25 ENCOUNTER — Ambulatory Visit: Admitting: Physician Assistant

## 2024-01-25 DIAGNOSIS — N390 Urinary tract infection, site not specified: Secondary | ICD-10-CM | POA: Diagnosis not present

## 2024-01-25 DIAGNOSIS — L97411 Non-pressure chronic ulcer of right heel and midfoot limited to breakdown of skin: Secondary | ICD-10-CM | POA: Diagnosis not present

## 2024-01-25 DIAGNOSIS — B965 Pseudomonas (aeruginosa) (mallei) (pseudomallei) as the cause of diseases classified elsewhere: Secondary | ICD-10-CM

## 2024-01-25 DIAGNOSIS — A4152 Sepsis due to Pseudomonas: Secondary | ICD-10-CM | POA: Diagnosis not present

## 2024-01-25 DIAGNOSIS — G8111 Spastic hemiplegia affecting right dominant side: Secondary | ICD-10-CM | POA: Diagnosis not present

## 2024-01-25 LAB — GLUCOSE, CAPILLARY
Glucose-Capillary: 165 mg/dL — ABNORMAL HIGH (ref 70–99)
Glucose-Capillary: 204 mg/dL — ABNORMAL HIGH (ref 70–99)

## 2024-01-25 MED ORDER — CIPROFLOXACIN HCL 500 MG PO TABS: 500.0000 mg | ORAL_TABLET | Freq: Two times a day (BID) | ORAL | 0 refills | Status: AC

## 2024-01-25 NOTE — Discharge Summary (Signed)
 Physician Discharge Summary   Patient: [name] MRN: [id number] DOB: Aug 04, 1945  Admit date:     01/19/2024  Discharge date: 01/25/24  Discharge Physician: Leita Blanch   PCP: Andi Jointer, DO   Recommendations at discharge:    F/u PCP in 1-2 weeks  Discharge Diagnoses: Principal Problem:   Sepsis (HCC) Active Problems:   UTI (urinary tract infection)   Type 2 diabetes mellitus (HCC)   Hypertension   History of stroke   Right spastic hemiparesis (HCC)   Partial symptomatic epilepsy with complex partial seizures, not intractable, without status epilepticus (HCC)   Skin ulcer of right heel, limited to breakdown of skin (HCC)   Grade I diastolic dysfunction   HLD (hyperlipidemia)   Acute urinary retention  TYSHAUNA Cameron is a 78 y.o. female with medical history significant of  CVA, right hemiparesis, chronic wound infection, HLD, seizure disorder, CHFpEF who had been in the SNF that they were not taking appropriate care of her wound    From H and P--On initial check and she had some urinary retention a Foley was placed and urine culture was sent at that time. The patient was started on Bactrim  for dirty urine. Today she became increasingly tremulous and hypotensive. Repeat labs showed an elevated white count of 13.3, lactic acid of 4.4, and some hypotension with blood pressure of 90s over 60s. The patient's urine culture was growing Pseudomonas. She was given IV fluid resuscitation code sepsis was called and she was started on cefepime .    Sepsis due to UTI, Pseudomonas -- lactic acid 4.6--- 2.6 -- received IV fluids -- sepsis improving. White count normal. No fever. Lactic acid down to 2.6. -- Change to oral antibiotic today--ciprofloxacin  -- discontinue Foley catheter   Type 2 diabetes mellitus (HCC) --Continue Lantus  and NovoLog  --Sliding scale insulin    Hypertension --resumed metoprolol  and Cozaar     History of stroke/ chronic Right spastic hemiparesis  (HCC) --On Eliquis  --Patient is bedbound at baseline   Partial symptomatic epilepsy with complex partial seizures, not intractable, without status epilepticus (HCC) --Continue Keppra    Acute urinary retention --Unclear etiology --foley d/ced--pt able to urinate   HLD (hyperlipidemia) --Continue statin   Grade I diastolic dysfunction --Appears euvolemic at present   Skin ulcer of right heel, limited to breakdown of skin (HCC) --Wound care as per WOC -- patient was last seen in the wound clinic on 15th July.  Per TOC pt's son has chosen [facility] d/c today     Procedures: Family communication : none today Consults : none CODE STATUS: full DVT Prophylaxis : eliquis        Disposition: Rehabilitation facility Diet recommendation:  Discharge Diet Orders (From admission, onward)     Start     Ordered   01/25/24 0000  Diet - low sodium heart healthy        01/25/24 1255           Cardiac diet DISCHARGE MEDICATION: Allergies as of 01/25/2024       Reactions   Pork-derived Products Other (See Comments)   To keep blood pressure down        Medication List     TAKE these medications    acetaminophen  325 MG tablet Commonly known as: TYLENOL  Take 650 mg by mouth every 6 (six) hours as needed.   atorvastatin  20 MG tablet Commonly known as: LIPITOR Take 20 mg by mouth daily.   ciprofloxacin  500 MG tablet Commonly known as: CIPRO  Take 1  tablet (500 mg total) by mouth 2 (two) times daily for 16 doses.   diclofenac  Sodium 1 % Gel Commonly known as: VOLTAREN  Apply 4 g topically 4 (four) times daily. What changed: additional instructions   Eliquis  5 MG Tabs tablet Generic drug: apixaban  Take 5 mg by mouth every 12 (twelve) hours.   guaiFENesin  100 MG/5ML liquid Commonly known as: ROBITUSSIN Take 10 mLs by mouth every 4 (four) hours as needed for cough or to loosen phlegm.   levETIRAcetam  500 MG tablet Commonly known as: KEPPRA  Take 1  tablet (500 mg total) by mouth 2 (two) times daily. What changed: when to take this   losartan  25 MG tablet Commonly known as: COZAAR  Take 12.5 mg by mouth daily.   metoprolol  succinate 25 MG 24 hr tablet Commonly known as: TOPROL -XL Take 25 mg by mouth daily.   polyethylene glycol 17 g packet Commonly known as: MIRALAX  / GLYCOLAX  Take 17 g by mouth daily as needed for mild constipation or moderate constipation. Use Miralax  if Milk of Magnesia is contraindicated (Example: Dialysis resident)   Tresiba  FlexTouch 100 UNIT/ML FlexTouch Pen Generic drug: insulin  degludec Inject 10 Units into the skin at bedtime. What changed: how much to take   Vitamin D3 50 MCG (2000 UT) Tabs Take 1 tablet by mouth daily.               Discharge Care Instructions  (From admission, onward)           Start     Ordered   01/25/24 0000  Discharge wound care:       Comments: 01/21/24 1200    Wound care  Daily      Comments: Bedside nurse; please change dressing to right leg Q MON/WED/FRI as follows: **If family wants to bring in Prisma from home, please use this dressing as outlined below. Otherwise, substitute Aquacel Soila # [phone]) since we do not carry Prisma in the [facility] formulary.  1. Cleanse wound with Vasche Soila # [phone]) pat dry 2. Apply a piece of Prisma dressing over the wound.  If not availbale, cut a piece of Aquacel to apply 3. Apply an oil emulsion dressing (Adaptic) over the dressing 4. Cover with ABD pad and kerlex   01/25/24 1255            Contact information for follow-up providers     Francisca Redell BROCKS, MD. Call today.   Specialty: Urology Why: To follow up Contact information: [address] [place] [address] [phone]         Andi Jointer, DO. Schedule an appointment as soon as possible for a visit in 1 week(s).   Specialty: Family Medicine Contact information: [address] [place] [address] [phone]               Contact information for after-discharge care     Destination     [facility] [place], [place] .   Service: Skilled Nursing Contact information: [address] [place]   [phone]                    Discharge Exam: Fredricka Weights   01/18/24 2039  Weight: 68 kg   GENERAL:  78 y.o.-year-old patient with no acute distress.  LUNGS: Normal breath sounds bilaterally, no wheezing CARDIOVASCULAR: S1, S2 normal. No murmur   ABDOMEN: Soft, nontender, nondistended.  EXTREMITIES: right lower extremity   NEUROLOGIC: nonfocal  patient is alert chronic right-sided hemiparesis SKIN:  as above  Condition at discharge: fair  The results of significant diagnostics from this hospitalization (including imaging, microbiology, ancillary and laboratory) are listed below for reference.   Imaging Studies: DG Chest Port 1 View Result Date: 01/19/2024 CLINICAL DATA:  78 year old female with cough. EXAM: PORTABLE CHEST 1 VIEW COMPARISON:  Portable chest 10/15/2022 and earlier. FINDINGS: Portable AP semi upright view at 0352 hours. Low lung volumes similar to 2024. Calcified aortic atherosclerosis. Mild cardiomegaly. Small gastric hiatal hernia. Other mediastinal contours are within normal limits. Visualized tracheal air column is within normal limits. Allowing for portable technique the lungs are clear. No pneumothorax or pleural effusion. Negative visible bowel gas. No acute osseous abnormality identified. IMPRESSION: 1. Low lung volumes, no acute cardiopulmonary abnormality. 2. Small chronic hiatal hernia. Aortic Atherosclerosis (ICD10-I70.0). Electronically Signed   By: VEAR Hurst M.D.   On: 01/19/2024 04:15    Microbiology: Results for orders placed or performed during the hospital encounter of 01/19/24  Urine Culture     Status: Abnormal   Collection Time: 01/19/24 11:36 AM   Specimen: Urine, Random  Result Value Ref Range Status   Specimen  Description   Final    URINE, RANDOM Performed at [facility], [address] [place], [place] [address]    Special Requests   Final    NONE Reflexed from [id number] Performed at [facility], [address] [place], [place] [address]    Culture 70,000 COLONIES/mL PSEUDOMONAS AERUGINOSA (A)  Final   Report Status 01/22/2024 FINAL  Final   Organism ID, Bacteria PSEUDOMONAS AERUGINOSA (A)  Final      Susceptibility   Pseudomonas aeruginosa - MIC*    CEFTAZIDIME 2 SENSITIVE Sensitive     CIPROFLOXACIN  <=0.25 SENSITIVE Sensitive     GENTAMICIN <=1 SENSITIVE Sensitive     IMIPENEM 2 SENSITIVE Sensitive     CEFEPIME  4 SENSITIVE Sensitive     * 70,000 COLONIES/mL PSEUDOMONAS AERUGINOSA  Blood culture (routine x 2)     Status: None (Preliminary result)   Collection Time: 01/22/24  6:54 PM   Specimen: BLOOD  Result Value Ref Range Status   Specimen Description BLOOD LEFT HAND  Final   Special Requests   Final    BOTTLES DRAWN AEROBIC AND ANAEROBIC Blood Culture results may not be optimal due to an inadequate volume of blood received in culture bottles   Culture   Final    NO GROWTH 3 DAYS Performed at [facility], [address] [place], [place] [address]    Report Status PENDING  Incomplete  Blood culture (routine x 2)     Status: None (Preliminary result)   Collection Time: 01/22/24  8:13 PM   Specimen: BLOOD  Result Value Ref Range Status   Specimen Description BLOOD LEFT ARM  Final   Special Requests   Final    BOTTLES DRAWN AEROBIC AND ANAEROBIC Blood Culture adequate volume   Culture   Final    NO GROWTH 3 DAYS Performed at [facility], [address] [place], [place] [address]    Report Status PENDING  Incomplete    Labs: CBC: Recent Labs  Lab 01/22/24 1854 01/23/24 0454  WBC 13.3* 9.4  NEUTROABS 9.7*  --   HGB 13.2 11.0*  HCT 42.0 34.9*  MCV 87.9 89.3  PLT 260 202   Basic Metabolic Panel: Recent Labs  Lab  01/22/24 1854 01/23/24 0454  NA 140 140  K 4.7 4.3  CL 104 105  CO2 24 25  GLUCOSE 188* 115*  BUN 10 10  CREATININE 0.96 0.81  CALCIUM  9.4 8.9   Liver Function Tests: No results for input(s): AST, ALT, ALKPHOS, BILITOT, PROT, ALBUMIN in the last 168 hours. CBG: Recent Labs  Lab 01/24/24 0840 01/24/24 1106 01/24/24 1626 01/24/24 2128 01/25/24 1143  GLUCAP 96 116* 182* 134* 204*    Discharge time spent: greater than 30 minutes.  Signed: Leita Blanch, MD [facility] 01/25/2024

## 2024-01-25 NOTE — TOC Transition Note (Signed)
 Transition of Care [facility]) - Discharge Note   Patient Details  Name: [name] MRN: [id number] Date of Birth: 20-Aug-1945  Transition of [facility]) CM/SW Contact:  Corean ONEIDA Haddock, RN Phone Number: 01/25/2024, 1:34 PM   Clinical Narrative:    Son Water engineer for LTC  Patient will DC to: [facility] DC date:01/25/24  Family notified: Son Warehouse manager by: Civil engineer, contracting   Per MD patient ready for DC to . RN, , patient's family, and facility notified of DC. Discharge Summary sent to facility. RN given number for report. DC packet on chart. Ambulance transport requested for patient.   TOC signing off.    Barriers to Discharge: ED Facility/Family Requesting Medication(s) Adjustment   Patient Goals and CMS Choice   CMS Medicare.gov Compare Post Acute Care list provided to:: Patient Represenative (must comment) (son- [name] Portugal, will follow up regarding choice) Choice offered to / list presented to : Adult Children (son- [name] Portugal, will follow up regarding choice)      Discharge Placement                       Discharge Plan and Services Additional resources added to the After Visit Summary for   In-house Referral: Clinical Social Work                                   Social Drivers of Health (SDOH) Interventions SDOH Screenings   Food Insecurity: No Food Insecurity (01/23/2024)  Housing: Low Risk  (01/23/2024)  Transportation Needs: No Transportation Needs (01/23/2024)  Utilities: Not At Risk (01/23/2024)  Depression (PHQ2-9): Low Risk  (02/10/2022)  Social Connections: Unknown (01/23/2024)  Tobacco Use: Medium Risk (01/18/2024)     Readmission Risk Interventions     No data to display

## 2024-01-25 NOTE — Plan of Care (Incomplete)
 Pt A&OX3 to 4. BP 120/73   Pulse 62   Temp (!) 97.5 F (36.4 C) (Oral)   Resp 16   Ht 5' 7 (1.702 m)   Wt 68 kg   SpO2 100%   BMI 23.48 kg/m  IV removed, purewick removed. Wound care provided. Report called to [facility]> AVS in packet, all belongings returned. No other needs voiced at this time. [name] 01/25/24

## 2024-01-25 NOTE — Progress Notes (Signed)
 Occupational Therapy Treatment Patient Details Name: [name] MRN: [id number] DOB: 14-Jun-1946 Today's Date: 01/25/2024   History of present illness [name] is a 78 y.o. female admitted for leg pain. Past medical history of prior CVA with right-sided deficits coming from a nursing facility with her son for request mainly for social work medical equipment for her home.   OT comments  Pt seen for OT this date to for hand hygiene given contractures from prior CVA. Pt quick to cry out in pain and is does not tolerate light stimuli to hemiparetic hand for attempts to check for skin integrity. +2 to boost up and reposition in bed for pressure relief/comfort. Given pt's baseline status of dependent care bed level, no further skilled OT needs are identified. Pt will continue to need total care +1-2 for all basic needs and hoyer for OOB transfers. OT to complete orders and sign off.       If plan is discharge home, recommend the following:  Two people to help with walking and/or transfers;Two people to help with bathing/dressing/bathroom;Direct supervision/assist for medications management;Supervision due to cognitive status;Direct supervision/assist for financial management;Assist for transportation;Assistance with cooking/housework;Assistance with feeding;Help with stairs or ramp for entrance   Equipment Recommendations  Other (comment)       Precautions / Restrictions Precautions Precautions: Fall Recall of Precautions/Restrictions: Impaired Restrictions Other Position/Activity Restrictions: R hand contracture       Mobility Bed Mobility Overal bed mobility: Needs Assistance             General bed mobility comments: +2 total to boost up in bed    Transfers                   General transfer comment: deferred; total care, hoyer lift at baseline     Balance Overall balance assessment: Needs assistance     Sitting balance - Comments: NT       Standing  balance comment: NT                           ADL either performed or assessed with clinical judgement   ADL Overall ADL's : Needs assistance/impaired                                       General ADL Comments: anticipate continued max-total assist for all ADLs     Communication Communication Communication: Impaired Factors Affecting Communication: Difficulty expressing self;Reduced clarity of speech   Cognition Arousal: Alert Behavior During Therapy: Flat affect, Anxious               OT - Cognition Comments: very fearful/anxious of any attempts at repositioning of R hand                 Following commands: Intact        Cueing   Cueing Techniques: Verbal cues, Tactile cues, Gestural cues             Pertinent Vitals/ Pain       Pain Assessment Pain Assessment: Faces Faces Pain Scale: Hurts little more Pain Location: pt cries out and screams with any touch Pain Descriptors / Indicators: Moaning, Grimacing, Guarding Pain Intervention(s): Limited activity within patient's tolerance, Monitored during session, Repositioned   Frequency  Min 1X/week        Progress Toward Goals  OT Goals(current  goals can now be found in the care plan section)  Progress towards OT goals: Not progressing toward goals - comment (no further acute OT needs identified. OT will sign off.)  Acute Rehab OT Goals OT Goal Formulation: With patient/family Time For Goal Achievement: 02/03/24 Potential to Achieve Goals: Fair  Plan         AM-PAC OT 6 Clicks Daily Activity     Outcome Measure   Help from another person eating meals?: A Lot Help from another person taking care of personal grooming?: Total Help from another person toileting, which includes using toliet, bedpan, or urinal?: Total Help from another person bathing (including washing, rinsing, drying)?: Total Help from another person to put on and taking off regular upper body  clothing?: Total Help from another person to put on and taking off regular lower body clothing?: Total 6 Click Score: 7    End of Session    OT Visit Diagnosis: Other abnormalities of gait and mobility (R26.89);Muscle weakness (generalized) (M62.81);Pain;Other symptoms and signs involving cognitive function Pain - Right/Left: Right Pain - part of body: Hand;Leg;Knee;Ankle and joints of foot;Arm   Activity Tolerance Patient limited by pain   Patient Left in bed;with call bell/phone within reach;with bed alarm set   Nurse Communication Mobility status        Time: 1110-1120 OT Time Calculation (min): 10 min  Charges: OT General Charges $OT Visit: 1 Visit OT Treatments $Self Care/Home Management : 8-22 mins  Eris Hannan L. Marka Treloar, OTR/L  01/25/24, 12:49 PM

## 2024-01-25 NOTE — Progress Notes (Signed)
 Pt wound care provided, tolerated procedure well no c/o pain. No bleeding, pus or foul odor noted. [name] 01/25/24 4:02 PM

## 2024-01-27 LAB — CULTURE, BLOOD (ROUTINE X 2)
Culture: NO GROWTH
Culture: NO GROWTH
Special Requests: ADEQUATE

## 2024-02-01 ENCOUNTER — Ambulatory Visit: Admitting: Physician Assistant

## 2024-02-01 ENCOUNTER — Ambulatory Visit (INDEPENDENT_AMBULATORY_CARE_PROVIDER_SITE_OTHER): Admitting: Vascular Surgery

## 2024-02-01 ENCOUNTER — Encounter (INDEPENDENT_AMBULATORY_CARE_PROVIDER_SITE_OTHER)

## 2024-02-03 ENCOUNTER — Ambulatory Visit: Admitting: Physician Assistant

## 2024-02-03 ENCOUNTER — Ambulatory Visit: Admitting: Urology

## 2024-02-07 ENCOUNTER — Telehealth: Payer: Self-pay

## 2024-02-07 NOTE — Telephone Encounter (Signed)
 [name], Nurse from [facility] states they removed pt's cath and will be going to [facility] urology

## 2024-02-09 ENCOUNTER — Ambulatory Visit: Admitting: Urology

## 2024-02-10 ENCOUNTER — Ambulatory Visit: Admitting: Surgery

## 2024-02-27 ENCOUNTER — Encounter: Admitting: Surgery

## 2024-03-07 NOTE — Progress Notes (Unsigned)
 Office Note     CC: Right lower extremity PAD with nonhealing wound Requesting Provider:  [name], *  HPI: [name] is a 78 y.o. (April 13, 1946) female presenting at the request of .Simpson-Tarokh, Leann, DO for right lower extremity wound.  On exam, Jaicey was doing well, accompanied by her son.  [name] had a stroke in 2016, leaving her bedbound.  She was unaware of why she was present today, and I think that there is a significant amount of dementia present.  [name] currently lives in a home, but her son plays a IT sales professional role in her care, bringing her 3 meals a day, as well as being heavily involved in her wound care.  She has had a chronic wound on the right lateral aspect of the leg.  This has waxed and waned in size.  [name] feels as though the wound is likely due to pressure. She has significant pain with any manipulation of any body part, simply touching her causes her to yell out.     Past Medical History:  Diagnosis Date   Acute blood loss anemia    CHF (congestive heart failure) (HCC)    EF 50-55%, grade 1 diastolic dysfunction per echo 89/7983   Coronary artery disease involving native artery of transplanted heart without angina pectoris    CVA (cerebral infarction) 04/2015   Started Plavix  04/2015   Depression    Diabetes (HCC) 2016   Type II. On insulin    Enchondroma of bone 2011   left femur.    Fracture, intertrochanteric, right femur (HCC)    History of lower GI bleeding    Hyperlipidemia    Hypertension    Patent foramen ovale 04/2015   Started on warfarin 04/2015   Protein calorie malnutrition (HCC)    Stercoral ulcer of rectum 05/16/2015    Past Surgical History:  Procedure Laterality Date   CARDIAC SURGERY     Cath without stent   COLONOSCOPY N/A 05/15/2015   Procedure: COLONOSCOPY;  Surgeon: Gustav Shila GAILS, MD;  Location: [facility] ENDOSCOPY;  Service: Endoscopy;  Laterality: N/A;   FLEXIBLE SIGMOIDOSCOPY N/A 05/15/2015   Procedure: FLEXIBLE  SIGMOIDOSCOPY;  Surgeon: Gustav Shila GAILS, MD;  Location: [facility] ENDOSCOPY;  Service: Endoscopy;  Laterality: N/A;  at bedside   INTRAMEDULLARY (IM) NAIL INTERTROCHANTERIC Right 06/12/2015   Procedure: INTRAMEDULLARY (IM) NAIL RIGHT HIP;  Surgeon: Evalene JONETTA Chancy, MD;  Location: [facility] OR;  Service: Orthopedics;  Laterality: Right;   TEE WITHOUT CARDIOVERSION N/A 05/05/2015   Procedure: TRANSESOPHAGEAL ECHOCARDIOGRAM (TEE);  Surgeon: Salena Negri, MD;  Location: [facility] ENDOSCOPY;  Service: Cardiovascular;  Laterality: N/A;    Social History   Socioeconomic History   Marital status: Married    Spouse name: Not on file   Number of children: 6   Years of education: 12   Highest education level: Not on file  Occupational History   Occupation: Retired  Tobacco Use   Smoking status: Former    Current packs/day: 0.00    Types: Cigarettes    Quit date: 04/05/2015    Years since quitting: 8.9   Smokeless tobacco: Never   Tobacco comments:    Quit 04/28/15  Vaping Use   Vaping status: Never Used  Substance and Sexual Activity   Alcohol use: No    Alcohol/week: 0.0 standard drinks of alcohol   Drug use: No   Sexual activity: Not on file  Other Topics Concern   Not on file  Social History Narrative   She  will be living with her two sons.\   Right-handed.   No caffeine use.   Social Drivers of Corporate investment banker Strain: Not on file  Food Insecurity: No Food Insecurity (01/23/2024)   Hunger Vital Sign    Worried About Running Out of Food in the Last Year: Never true    Ran Out of Food in the Last Year: Never true  Transportation Needs: No Transportation Needs (01/23/2024)   PRAPARE - Administrator, Civil Service (Medical): No    Lack of Transportation (Non-Medical): No  Physical Activity: Not on file  Stress: Not on file  Social Connections: Unknown (01/23/2024)   Social Connection and Isolation Panel    Frequency of Communication with Friends and Family: Twice a week     Frequency of Social Gatherings with Friends and Family: Three times a week    Attends Religious Services: Not on file    Active Member of Clubs or Organizations: Not on file    Attends Club or Organization Meetings: Not on file    Marital Status: Not on file  Intimate Partner Violence: Not At Risk (01/23/2024)   Humiliation, Afraid, Rape, and Kick questionnaire    Fear of Current or Ex-Partner: No    Emotionally Abused: No    Physically Abused: No    Sexually Abused: No   Family History  Problem Relation Age of Onset   Hypertension Mother    Heart failure Mother    Hyperlipidemia Mother    Heart attack Father     Current Outpatient Medications  Medication Sig Dispense Refill   acetaminophen  (TYLENOL ) 325 MG tablet Take 650 mg by mouth every 6 (six) hours as needed.     atorvastatin  (LIPITOR) 20 MG tablet Take 20 mg by mouth daily.     Cholecalciferol  (VITAMIN D3) 50 MCG (2000 UT) TABS Take 1 tablet by mouth daily.     diclofenac  Sodium (VOLTAREN ) 1 % GEL Apply 4 g topically 4 (four) times daily. (Patient taking differently: Apply 4 g topically 4 (four) times daily. APPLY 4 GRAMS TOPICALLY TO EACH KNEE FOUR TIMES DAILY.) 100 g 0   ELIQUIS  5 MG TABS tablet Take 5 mg by mouth every 12 (twelve) hours.     guaiFENesin  (ROBITUSSIN) 100 MG/5ML liquid Take 10 mLs by mouth every 4 (four) hours as needed for cough or to loosen phlegm.     levETIRAcetam  (KEPPRA ) 500 MG tablet Take 1 tablet (500 mg total) by mouth 2 (two) times daily. (Patient taking differently: Take 500 mg by mouth every 12 (twelve) hours.) 20 tablet 0   losartan  (COZAAR ) 25 MG tablet Take 12.5 mg by mouth daily.      metoprolol  succinate (TOPROL -XL) 25 MG 24 hr tablet Take 25 mg by mouth daily.      polyethylene glycol (MIRALAX  / GLYCOLAX ) 17 g packet Take 17 g by mouth daily as needed for mild constipation or moderate constipation. Use Miralax  if Milk of Magnesia is contraindicated (Example: Dialysis resident)     TRESIBA   FLEXTOUCH 100 UNIT/ML FlexTouch Pen Inject 10 Units into the skin at bedtime. (Patient taking differently: Inject 5 Units into the skin at bedtime.) 15 mL 0   No current facility-administered medications for this visit.    Allergies  Allergen Reactions   Pork-Derived Products Other (See Comments)    To keep blood pressure down     REVIEW OF SYSTEMS:  [X]  denotes positive finding, [ ]  denotes negative finding Cardiac  Comments:  Chest pain or chest pressure:    Shortness of breath upon exertion:    Short of breath when lying flat:    Irregular heart rhythm:        Vascular    Pain in calf, thigh, or hip brought on by ambulation:    Pain in feet at night that wakes you up from your sleep:     Blood clot in your veins:    Leg swelling:         Pulmonary    Oxygen at home:    Productive cough:     Wheezing:         Neurologic    Sudden weakness in arms or legs:     Sudden numbness in arms or legs:     Sudden onset of difficulty speaking or slurred speech:    Temporary loss of vision in one eye:     Problems with dizziness:         Gastrointestinal    Blood in stool:     Vomited blood:         Genitourinary    Burning when urinating:     Blood in urine:        Psychiatric    Major depression:         Hematologic    Bleeding problems:    Problems with blood clotting too easily:        Skin    Rashes or ulcers:        Constitutional    Fever or chills:      PHYSICAL EXAMINATION:  There were no vitals filed for this visit.  General:  WDWN in NAD; vital signs documented above Gait: Not observed HENT: WNL, normocephalic Pulmonary: normal non-labored breathing , without wheezing Cardiac: regular HR Abdomen: soft, NT, no masses Skin: without rashes Vascular Exam/Pulses:  Right Left  Radial 2+ (normal) 2+ (normal)  Ulnar    Femoral    Popliteal    DP absent   PT     Extremities: without ischemic changes, without Gangrene , without cellulitis; with  open wounds;  Musculoskeletal: Significant muscle wasting and atrophy  Neurologic: Not oriented Psychiatric:  The pt has Normal affect.   Non-Invasive Vascular Imaging:         ASSESSMENT/PLAN: JANMARIE SMOOT is a 78 y.o. female presenting with waxing and waning chronic right lower extremity wound.  The distribution of this wound is most consistent with pressure ulceration as she has been bedbound since her stroke in 2016. Imaging at her facility is discordant with ABI that was obtained in April. Regardless, it is a mute point due to her nonambulatory status and contracted limbs.    I had a long conversation with Aldea's son [name], regarding her care.  Being that she is nonambulatory, Melinda is not a revascularization candidate.  We discussed that I think it is very reasonable to continue wound care, trying to limit pressure to the wound bed in hopes that the lesion heals, and that overall the wounds appeared healthy and granulating on my assessment.  From a vascular surgery perspective, the only intervention I would offer would be an above-knee amputation which he is not interested in.  He asked for a referral to the wound care center to help with wound healing which I think is reasonable.  At this time, Kallee can follow-up with me as needed.  [name] is aware that the only intervention I would offer would be above-knee amputation.  Fonda FORBES Rim, MD [facility] [phone]

## 2024-03-08 ENCOUNTER — Encounter: Payer: Self-pay | Admitting: Vascular Surgery

## 2024-03-08 ENCOUNTER — Ambulatory Visit: Attending: Vascular Surgery | Admitting: Vascular Surgery

## 2024-03-08 VITALS — BP 141/81 | HR 67 | Temp 98.2°F | Resp 22 | Ht 67.0 in | Wt 149.0 lb

## 2024-03-08 DIAGNOSIS — S81801A Unspecified open wound, right lower leg, initial encounter: Secondary | ICD-10-CM | POA: Diagnosis not present

## 2024-03-15 ENCOUNTER — Encounter: Admitting: Vascular Surgery

## 2024-03-21 ENCOUNTER — Ambulatory Visit (HOSPITAL_BASED_OUTPATIENT_CLINIC_OR_DEPARTMENT_OTHER): Admitting: General Surgery

## 2024-03-21 ENCOUNTER — Encounter (HOSPITAL_BASED_OUTPATIENT_CLINIC_OR_DEPARTMENT_OTHER): Attending: Internal Medicine | Admitting: Internal Medicine

## 2024-03-21 DIAGNOSIS — Z8673 Personal history of transient ischemic attack (TIA), and cerebral infarction without residual deficits: Secondary | ICD-10-CM | POA: Insufficient documentation

## 2024-03-21 DIAGNOSIS — I509 Heart failure, unspecified: Secondary | ICD-10-CM | POA: Diagnosis not present

## 2024-03-21 DIAGNOSIS — I2510 Atherosclerotic heart disease of native coronary artery without angina pectoris: Secondary | ICD-10-CM | POA: Diagnosis not present

## 2024-03-21 DIAGNOSIS — I87311 Chronic venous hypertension (idiopathic) with ulcer of right lower extremity: Secondary | ICD-10-CM | POA: Diagnosis not present

## 2024-03-21 DIAGNOSIS — E1165 Type 2 diabetes mellitus with hyperglycemia: Secondary | ICD-10-CM | POA: Diagnosis not present

## 2024-03-21 DIAGNOSIS — E11622 Type 2 diabetes mellitus with other skin ulcer: Secondary | ICD-10-CM | POA: Diagnosis not present

## 2024-03-21 DIAGNOSIS — Z794 Long term (current) use of insulin: Secondary | ICD-10-CM | POA: Insufficient documentation

## 2024-03-21 DIAGNOSIS — L89893 Pressure ulcer of other site, stage 3: Secondary | ICD-10-CM | POA: Insufficient documentation

## 2024-03-21 DIAGNOSIS — S81801A Unspecified open wound, right lower leg, initial encounter: Secondary | ICD-10-CM | POA: Diagnosis not present

## 2024-03-21 DIAGNOSIS — I110 Hypertensive heart disease with heart failure: Secondary | ICD-10-CM | POA: Insufficient documentation

## 2024-03-21 DIAGNOSIS — Z87891 Personal history of nicotine dependence: Secondary | ICD-10-CM | POA: Diagnosis not present

## 2024-03-21 DIAGNOSIS — Z7984 Long term (current) use of oral hypoglycemic drugs: Secondary | ICD-10-CM | POA: Diagnosis not present

## 2024-03-21 DIAGNOSIS — F03C0 Unspecified dementia, severe, without behavioral disturbance, psychotic disturbance, mood disturbance, and anxiety: Secondary | ICD-10-CM | POA: Diagnosis not present

## 2024-03-21 DIAGNOSIS — Z7901 Long term (current) use of anticoagulants: Secondary | ICD-10-CM | POA: Insufficient documentation

## 2024-03-28 ENCOUNTER — Emergency Department (HOSPITAL_COMMUNITY)
Admission: EM | Admit: 2024-03-28 | Discharge: 2024-03-28 | Disposition: A | Discharge status: Home / Self Care | Source: Skilled Nursing Facility | Attending: Emergency Medicine | Admitting: Emergency Medicine

## 2024-03-28 ENCOUNTER — Other Ambulatory Visit: Payer: Self-pay

## 2024-03-28 ENCOUNTER — Encounter (HOSPITAL_BASED_OUTPATIENT_CLINIC_OR_DEPARTMENT_OTHER): Admitting: Internal Medicine

## 2024-03-28 ENCOUNTER — Encounter (HOSPITAL_COMMUNITY): Payer: Self-pay

## 2024-03-28 DIAGNOSIS — S81801A Unspecified open wound, right lower leg, initial encounter: Secondary | ICD-10-CM

## 2024-03-28 DIAGNOSIS — I2510 Atherosclerotic heart disease of native coronary artery without angina pectoris: Secondary | ICD-10-CM | POA: Diagnosis not present

## 2024-03-28 DIAGNOSIS — E119 Type 2 diabetes mellitus without complications: Secondary | ICD-10-CM | POA: Diagnosis not present

## 2024-03-28 DIAGNOSIS — R300 Dysuria: Secondary | ICD-10-CM | POA: Insufficient documentation

## 2024-03-28 DIAGNOSIS — Z8673 Personal history of transient ischemic attack (TIA), and cerebral infarction without residual deficits: Secondary | ICD-10-CM | POA: Diagnosis present

## 2024-03-28 DIAGNOSIS — L89893 Pressure ulcer of other site, stage 3: Secondary | ICD-10-CM

## 2024-03-28 DIAGNOSIS — I509 Heart failure, unspecified: Secondary | ICD-10-CM | POA: Diagnosis not present

## 2024-03-28 DIAGNOSIS — E11622 Type 2 diabetes mellitus with other skin ulcer: Secondary | ICD-10-CM

## 2024-03-28 DIAGNOSIS — I87311 Chronic venous hypertension (idiopathic) with ulcer of right lower extremity: Secondary | ICD-10-CM | POA: Diagnosis not present

## 2024-03-28 LAB — URINALYSIS, W/ REFLEX TO CULTURE (INFECTION SUSPECTED)
Bacteria, UA: NONE SEEN
Bilirubin Urine: NEGATIVE
Glucose, UA: NEGATIVE mg/dL
Hgb urine dipstick: NEGATIVE
Ketones, ur: NEGATIVE mg/dL
Leukocytes,Ua: NEGATIVE
Nitrite: NEGATIVE
Protein, ur: NEGATIVE mg/dL
Specific Gravity, Urine: 1.014 (ref 1.005–1.030)
pH: 5 (ref 5.0–8.0)

## 2024-03-28 LAB — CBC WITH DIFFERENTIAL/PLATELET
Abs Immature Granulocytes: 0.03 K/uL (ref 0.00–0.07)
Basophils Absolute: 0.1 K/uL (ref 0.0–0.1)
Basophils Relative: 1 %
Eosinophils Absolute: 0.3 K/uL (ref 0.0–0.5)
Eosinophils Relative: 3 %
HCT: 44.7 % (ref 36.0–46.0)
Hemoglobin: 13.7 g/dL (ref 12.0–15.0)
Immature Granulocytes: 0 %
Lymphocytes Relative: 32 %
Lymphs Abs: 3.2 K/uL (ref 0.7–4.0)
MCH: 27.1 pg (ref 26.0–34.0)
MCHC: 30.6 g/dL (ref 30.0–36.0)
MCV: 88.5 fL (ref 80.0–100.0)
Monocytes Absolute: 0.7 K/uL (ref 0.1–1.0)
Monocytes Relative: 7 %
Neutro Abs: 5.6 K/uL (ref 1.7–7.7)
Neutrophils Relative %: 57 %
Platelets: 219 K/uL (ref 150–400)
RBC: 5.05 MIL/uL (ref 3.87–5.11)
RDW: 13 % (ref 11.5–15.5)
WBC: 10 K/uL (ref 4.0–10.5)
nRBC: 0 % (ref 0.0–0.2)

## 2024-03-28 LAB — COMPREHENSIVE METABOLIC PANEL WITH GFR
ALT: 35 U/L (ref 0–44)
AST: 33 U/L (ref 15–41)
Albumin: 3.7 g/dL (ref 3.5–5.0)
Alkaline Phosphatase: 90 U/L (ref 38–126)
Anion gap: 12 (ref 5–15)
BUN: 18 mg/dL (ref 8–23)
CO2: 23 mmol/L (ref 22–32)
Calcium: 9.4 mg/dL (ref 8.9–10.3)
Chloride: 109 mmol/L (ref 98–111)
Creatinine, Ser: 0.72 mg/dL (ref 0.44–1.00)
GFR, Estimated: >60 mL/min (ref >60)
Glucose, Bld: 102 mg/dL — ABNORMAL HIGH (ref 70–99)
Potassium: 4.3 mmol/L (ref 3.5–5.1)
Sodium: 144 mmol/L (ref 135–145)
Total Bilirubin: 0.4 mg/dL (ref 0.0–1.2)
Total Protein: 6.2 g/dL — ABNORMAL LOW (ref 6.5–8.1)

## 2024-03-28 LAB — CBG MONITORING, ED: Glucose-Capillary: 75 mg/dL (ref 70–99)

## 2024-03-28 MED ORDER — ACETAMINOPHEN 325 MG PO TABS: 650.0000 mg | ORAL_TABLET | Freq: Four times a day (QID) | ORAL | 0 refills | Status: AC | PRN

## 2024-03-28 MED ORDER — APIXABAN 2.5 MG PO TABS
5.0000 mg | ORAL_TABLET | Freq: Once | ORAL | Status: AC
  Administered 2024-03-28: 5 mg via ORAL
  Filled 2024-03-28: qty 2

## 2024-03-28 MED ORDER — ELIQUIS 5 MG PO TABS: 5.0000 mg | ORAL_TABLET | Freq: Two times a day (BID) | ORAL | 0 refills | Status: DC

## 2024-03-28 MED ORDER — ATORVASTATIN CALCIUM 10 MG PO TABS
20.0000 mg | ORAL_TABLET | Freq: Once | ORAL | Status: AC
  Administered 2024-03-28: 20 mg via ORAL
  Filled 2024-03-28: qty 2

## 2024-03-28 MED ORDER — LEVETIRACETAM 500 MG PO TABS: 500.0000 mg | ORAL_TABLET | Freq: Two times a day (BID) | ORAL | 0 refills | Status: DC

## 2024-03-28 MED ORDER — INSULIN GLARGINE 100 UNIT/ML ~~LOC~~ SOLN
10.0000 [IU] | Freq: Once | SUBCUTANEOUS | Status: AC
  Administered 2024-03-28: 10 [IU] via SUBCUTANEOUS
  Filled 2024-03-28: qty 0.1

## 2024-03-28 MED ORDER — DICLOFENAC SODIUM 1 % EX GEL: 4.0000 g | Freq: Four times a day (QID) | CUTANEOUS | 0 refills | Status: DC

## 2024-03-28 MED ORDER — TRESIBA FLEXTOUCH 100 UNIT/ML ~~LOC~~ SOPN: 10.0000 [IU] | PEN_INJECTOR | Freq: Every day | SUBCUTANEOUS | 0 refills | Status: DC

## 2024-03-28 MED ORDER — DICLOFENAC SODIUM 1 % EX GEL: 4.0000 g | Freq: Four times a day (QID) | CUTANEOUS | 0 refills | Status: AC

## 2024-03-28 MED ORDER — METOPROLOL SUCCINATE ER 25 MG PO TB24: 25.0000 mg | ORAL_TABLET | Freq: Every day | ORAL | 0 refills | Status: DC

## 2024-03-28 MED ORDER — INSULIN DEGLUDEC 100 UNIT/ML ~~LOC~~ SOPN: 10.0000 [IU] | PEN_INJECTOR | Freq: Once | SUBCUTANEOUS | Status: DC

## 2024-03-28 MED ORDER — ATORVASTATIN CALCIUM 20 MG PO TABS: 20.0000 mg | ORAL_TABLET | Freq: Every day | ORAL | 0 refills | Status: DC

## 2024-03-28 MED ORDER — POLYETHYLENE GLYCOL 3350 17 G PO PACK: 17.0000 g | PACK | Freq: Every day | ORAL | 0 refills | Status: DC | PRN

## 2024-03-28 MED ORDER — LOSARTAN POTASSIUM 25 MG PO TABS: 12.5000 mg | ORAL_TABLET | Freq: Every day | ORAL | 0 refills | Status: DC

## 2024-03-28 MED ORDER — SODIUM CHLORIDE 0.9 % IV BOLUS
500.0000 mL | Freq: Once | INTRAVENOUS | Status: AC
  Administered 2024-03-28: 500 mL via INTRAVENOUS

## 2024-03-28 NOTE — ED Triage Notes (Signed)
 Patient BIB [name] from [facility].  Family member reports suspect UTI as facility has not been keeping her clean.  She will not be going back to that facility.  Family requesting urinalysis and culture as patient had been septic for same earlier.

## 2024-03-28 NOTE — ED Provider Notes (Signed)
  EMERGENCY DEPARTMENT AT [facility] Provider Note   CSN: [id number] Arrival date & time: 03/28/24  1436     Patient presents with: possible UTI   [name] is a 78 y.o. female.  {Add pertinent medical, surgical, social history, OB history to HPI:32947} Pt is a 78 yo female with pmhx significant for CHF, DM2, hx CVA, anemia, HLD, GIB, CAD, and depression.  Pt had been staying at [facility].  Her son said he's found her a few times sitting in stool in a brief that has not been changed in several hours.  Pt is going to take her home to his house, but wanted to make sure she did not have a UTI prior to taking her home.  He said he has everything he needs for her at home.  Pt feels well.       Prior to Admission medications   Medication Sig Start Date End Date Taking? Authorizing Provider  acetaminophen  (TYLENOL ) 325 MG tablet Take 2 tablets (650 mg total) by mouth every 6 (six) hours as needed. 03/28/24   Dean Clarity, MD  atorvastatin  (LIPITOR) 20 MG tablet Take 1 tablet (20 mg total) by mouth daily. 03/28/24   Dean Clarity, MD  Cholecalciferol  (VITAMIN D3) 50 MCG (2000 UT) TABS Take 1 tablet by mouth daily.    [provider]  diclofenac  Sodium (VOLTAREN ) 1 % GEL Apply 4 g topically 4 (four) times daily. 03/28/24   Dean Clarity, MD  ELIQUIS  5 MG TABS tablet Take 1 tablet (5 mg total) by mouth every 12 (twelve) hours. 03/28/24   Dean Clarity, MD  guaiFENesin  (ROBITUSSIN) 100 MG/5ML liquid Take 10 mLs by mouth every 4 (four) hours as needed for cough or to loosen phlegm.    [provider]  levETIRAcetam  (KEPPRA ) 500 MG tablet Take 1 tablet (500 mg total) by mouth every 12 (twelve) hours. 03/28/24   Dean Clarity, MD  losartan  (COZAAR ) 25 MG tablet Take 0.5 tablets (12.5 mg total) by mouth daily. 03/28/24   Keevan Wolz, MD  metoprolol  succinate (TOPROL -XL) 25 MG 24 hr tablet Take 1 tablet (25 mg total) by mouth daily. 03/28/24    Yulia Ulrich, MD  polyethylene glycol (MIRALAX  / GLYCOLAX ) 17 g packet Take 17 g by mouth daily as needed for mild constipation or moderate constipation. Use Miralax  if Milk of Magnesia is contraindicated (Example: Dialysis resident) 03/28/24   Dean Clarity, MD  TRESIBA  FLEXTOUCH 100 UNIT/ML FlexTouch Pen Inject 10 Units into the skin at bedtime. 03/28/24   Dean Clarity, MD    Allergies: Pork-derived products    Review of Systems  All other systems reviewed and are negative.   Updated Vital Signs BP 131/87   Pulse 66   Temp 98 F (36.7 C) (Oral)   Resp 16   SpO2 98%   Physical Exam Vitals and nursing note reviewed.  Constitutional:      Appearance: Normal appearance.  HENT:     Head: Normocephalic and atraumatic.     Right Ear: External ear normal.     Left Ear: External ear normal.     Nose: Nose normal.     Mouth/Throat:     Mouth: Mucous membranes are moist.     Pharynx: Oropharynx is clear.  Eyes:     Extraocular Movements: Extraocular movements intact.     Conjunctiva/sclera: Conjunctivae normal.     Pupils: Pupils are equal, round, and reactive to light.  Cardiovascular:  Rate and Rhythm: Normal rate and regular rhythm.     Pulses: Normal pulses.     Heart sounds: Normal heart sounds.  Pulmonary:     Effort: Pulmonary effort is normal.     Breath sounds: Normal breath sounds.  Abdominal:     General: Abdomen is flat. Bowel sounds are normal.     Palpations: Abdomen is soft.  Musculoskeletal:     Cervical back: Normal range of motion and neck supple.     Comments: No deformities  Skin:    General: Skin is warm.     Capillary Refill: Capillary refill takes less than 2 seconds.  Neurological:     Mental Status: She is alert and oriented to person, place, and time. Mental status is at baseline.     Comments: Right spastic hemiparesis  Psychiatric:        Mood and Affect: Mood normal.        Behavior: Behavior normal.     (all labs ordered are  listed, but only abnormal results are displayed) Labs Reviewed  COMPREHENSIVE METABOLIC PANEL WITH GFR - Abnormal; Notable for the following components:      Result Value   Glucose, Bld 102 (*)    Total Protein 6.2 (*)    All other components within normal limits  URINE CULTURE  URINALYSIS, W/ REFLEX TO CULTURE (INFECTION SUSPECTED)  CBC WITH DIFFERENTIAL/PLATELET    EKG: None  Radiology: No results found.  {Document cardiac monitor, telemetry assessment procedure when appropriate:32947} Procedures   Medications Ordered in the ED  sodium chloride  0.9 % bolus 500 mL (500 mLs Intravenous New Bag/Given 03/28/24 1815)      {Click here for ABCD2, HEART and other calculators REFRESH Note before signing:1}                              Medical Decision Making Amount and/or Complexity of Data Reviewed Labs: ordered.   This patient presents to the ED for concern of uti, this involves an extensive number of treatment options, and is a complaint that carries with it a high risk of complications and morbidity.  The differential diagnosis includes uti, electrolyte abn   Co morbidities that complicate the patient evaluation  CHF, DM2, hx CVA, anemia, HLD, GIB, CAD, and depression   Additional history obtained:  Additional history obtained from epic chart review External records from outside source obtained and reviewed including son, EMS report   Lab Tests:  I Ordered, and personally interpreted labs.  The pertinent results include:  cbc nl, ua neg, cmp nl   Cardiac Monitoring:  The patient was maintained on a cardiac monitor.  I personally viewed and interpreted the cardiac monitored which showed an underlying rhythm of: nsr   Medicines ordered and prescription drug management:  I ordered medication including ivfs  for sx  Reevaluation of the patient after these medicines showed that the patient improved I have reviewed the patients home medicines and have made  adjustments as needed  Problem List / ED Course:  AMS:  pt is back to normal.  She is stable for d/c.  Return if worse.  Pt's son needs a rx for all her meds.  He said the SNF would not give them to her.  We will give her the evening dose prior to her d/c.   Reevaluation:  After the interventions noted above, I reevaluated the patient and found that they have :improved  Social Determinants of Health:  Lives at SNF.  Pt going to son's house.   Dispostion:  After consideration of the diagnostic results and the patients response to treatment, I feel that the patent would benefit from discharge with outpatient f/u.    {Document critical care time when appropriate  Document review of labs and clinical decision tools ie CHADS2VASC2, etc  Document your independent review of radiology images and any outside records  Document your discussion with family members, caretakers and with consultants  Document social determinants of health affecting pt's care  Document your decision making why or why not admission, treatments were needed:32947:::1}   Final diagnoses:  History of CVA (cerebrovascular accident)    ED Discharge Orders          Ordered    diclofenac  Sodium (VOLTAREN ) 1 % GEL  4 times daily        03/28/24 1828    metoprolol  succinate (TOPROL -XL) 25 MG 24 hr tablet  Daily        03/28/24 1828    polyethylene glycol (MIRALAX  / GLYCOLAX ) 17 g packet  Daily PRN        03/28/24 1828    losartan  (COZAAR ) 25 MG tablet  Daily        03/28/24 1828    levETIRAcetam  (KEPPRA ) 500 MG tablet  Every 12 hours       Note to Pharmacy: Must keep appt on 05/26/20 to continue refills.   03/28/24 1828    ELIQUIS  5 MG TABS tablet  Every 12 hours        03/28/24 1828    atorvastatin  (LIPITOR) 20 MG tablet  Daily        03/28/24 1828    TRESIBA  FLEXTOUCH 100 UNIT/ML FlexTouch Pen  Daily at bedtime        03/28/24 1828    acetaminophen  (TYLENOL ) 325 MG tablet  Every 6 hours PRN         03/28/24 1828

## 2024-03-28 NOTE — ED Notes (Signed)
[facility] contacted for pickup

## 2024-03-29 ENCOUNTER — Telehealth (HOSPITAL_COMMUNITY): Payer: Self-pay | Admitting: Emergency Medicine

## 2024-03-29 MED ORDER — TRESIBA FLEXTOUCH 100 UNIT/ML ~~LOC~~ SOPN: 10.0000 [IU] | PEN_INJECTOR | Freq: Every day | SUBCUTANEOUS | 0 refills | Status: DC

## 2024-03-29 NOTE — Telephone Encounter (Signed)
 Needs insulin  pen ordered again

## 2024-03-30 ENCOUNTER — Telehealth: Payer: Self-pay | Admitting: Radiology

## 2024-03-30 NOTE — Telephone Encounter (Signed)
 Copied from CRM #[id number]. Topic: General - Other >> Mar 30, 2024 11:58 AM Berneda FALCON wrote: Reason for CRM: Pam from [facility] home care attempted to do a skilled nursing point of care appt for this patient for a wound on her right lower leg-they are using hydrogel-she does not hav an available care giver. Son expected them to come several times a week to look at the wound in addition to her going to the [place] center.  She states that [facility] could not do the woundcare in addition to her going to the wound center and son states he will find alternate care for her.  Wants PCP to know that [facility] cannot do the skilled nursing for them if they do not have a caregiver with her and when she is being treated at the [facility] center as well. Son was not interested in their services and will opt for other services.  Pam's callback if needed [phone] (direct line)

## 2024-03-31 LAB — URINE CULTURE: Culture: 10000 — AB

## 2024-04-01 ENCOUNTER — Telehealth (HOSPITAL_BASED_OUTPATIENT_CLINIC_OR_DEPARTMENT_OTHER): Payer: Self-pay

## 2024-04-01 NOTE — Telephone Encounter (Signed)
 Post ED Visit - Positive Culture Follow-up  Culture report reviewed by antimicrobial stewardship pharmacist: [facility] []  Rankin Dee, Pharm.D. []  Venetia Gully, Pharm.D., BCPS AQ-ID []  Garrel Crews, Pharm.D., BCPS []  Almarie Lunger, [address].D., BCPS []  [place], [place].D., BCPS, AAHIVP []  Rosaline Bihari, Pharm.D., BCPS, AAHIVP []  Vernell Meier, PharmD, BCPS []  Latanya Hint, PharmD, BCPS []  Donald Medley, PharmD, BCPS []  Rocky Bold, PharmD []  Dorothyann Alert, PharmD, BCPS []  Morene Babe, PharmD  [facility] []  Rosaline Edison, PharmD []  Romona Bliss, PharmD []  Dolphus Roller, PharmD []  Veva Seip, Rph []  Vernell Daunt) Leonce, PharmD []  Eva Allis, PharmD []  Rosaline Millet, PharmD []  Iantha Batch, PharmD []  Arvin Gauss, PharmD [x]  Wanda Hasting, PharmD []  Ronal Rav, PharmD []  Rocky Slade, PharmD []  Bard Jeans, PharmD   Positive urine culture  No s/s reported, WBC WNL, afebrile, UA; no bacteria, No WBC, Epith 6-10 no treatment needed and no further patient follow-up is required at this time.  Ruth Camelia Elbe 04/01/2024, 3:24 PM

## 2024-04-04 ENCOUNTER — Encounter (HOSPITAL_BASED_OUTPATIENT_CLINIC_OR_DEPARTMENT_OTHER): Admitting: General Surgery

## 2024-04-04 ENCOUNTER — Encounter (HOSPITAL_BASED_OUTPATIENT_CLINIC_OR_DEPARTMENT_OTHER): Attending: Internal Medicine | Admitting: Internal Medicine

## 2024-04-04 DIAGNOSIS — X58XXXA Exposure to other specified factors, initial encounter: Secondary | ICD-10-CM | POA: Diagnosis not present

## 2024-04-04 DIAGNOSIS — I509 Heart failure, unspecified: Secondary | ICD-10-CM | POA: Insufficient documentation

## 2024-04-04 DIAGNOSIS — E11622 Type 2 diabetes mellitus with other skin ulcer: Secondary | ICD-10-CM | POA: Diagnosis not present

## 2024-04-04 DIAGNOSIS — I2510 Atherosclerotic heart disease of native coronary artery without angina pectoris: Secondary | ICD-10-CM | POA: Diagnosis not present

## 2024-04-04 DIAGNOSIS — S81801A Unspecified open wound, right lower leg, initial encounter: Secondary | ICD-10-CM | POA: Diagnosis not present

## 2024-04-04 DIAGNOSIS — Z87891 Personal history of nicotine dependence: Secondary | ICD-10-CM | POA: Insufficient documentation

## 2024-04-04 DIAGNOSIS — I110 Hypertensive heart disease with heart failure: Secondary | ICD-10-CM | POA: Insufficient documentation

## 2024-04-04 DIAGNOSIS — L89893 Pressure ulcer of other site, stage 3: Secondary | ICD-10-CM | POA: Insufficient documentation

## 2024-04-04 DIAGNOSIS — I69951 Hemiplegia and hemiparesis following unspecified cerebrovascular disease affecting right dominant side: Secondary | ICD-10-CM | POA: Diagnosis not present

## 2024-04-04 DIAGNOSIS — I87311 Chronic venous hypertension (idiopathic) with ulcer of right lower extremity: Secondary | ICD-10-CM | POA: Insufficient documentation

## 2024-04-09 ENCOUNTER — Encounter (HOSPITAL_BASED_OUTPATIENT_CLINIC_OR_DEPARTMENT_OTHER): Admitting: Internal Medicine

## 2024-04-09 DIAGNOSIS — L89893 Pressure ulcer of other site, stage 3: Secondary | ICD-10-CM | POA: Diagnosis not present

## 2024-04-09 DIAGNOSIS — E11622 Type 2 diabetes mellitus with other skin ulcer: Secondary | ICD-10-CM | POA: Diagnosis not present

## 2024-04-09 DIAGNOSIS — S81801A Unspecified open wound, right lower leg, initial encounter: Secondary | ICD-10-CM | POA: Diagnosis not present

## 2024-04-09 DIAGNOSIS — I87311 Chronic venous hypertension (idiopathic) with ulcer of right lower extremity: Secondary | ICD-10-CM

## 2024-04-18 ENCOUNTER — Encounter (HOSPITAL_BASED_OUTPATIENT_CLINIC_OR_DEPARTMENT_OTHER): Admitting: Internal Medicine

## 2024-04-18 DIAGNOSIS — I87311 Chronic venous hypertension (idiopathic) with ulcer of right lower extremity: Secondary | ICD-10-CM

## 2024-04-18 DIAGNOSIS — L89893 Pressure ulcer of other site, stage 3: Secondary | ICD-10-CM

## 2024-04-18 DIAGNOSIS — S81801A Unspecified open wound, right lower leg, initial encounter: Secondary | ICD-10-CM

## 2024-04-18 DIAGNOSIS — E11622 Type 2 diabetes mellitus with other skin ulcer: Secondary | ICD-10-CM

## 2024-04-20 ENCOUNTER — Telehealth: Payer: Self-pay

## 2024-04-20 NOTE — Telephone Encounter (Signed)
 Called & spoke with Pt Son letting him know the paperwork may not be started due to her not being seen since 2023 but he is more then welcome to bring them for the appt on Monday.

## 2024-04-20 NOTE — Telephone Encounter (Signed)
 Copied from CRM #[id number]. Topic: General - Other >> Apr 19, 2024  1:59 PM Thersia BROCKS wrote: Reason for CRM: Patient Son, called in regarding patients appointment for 10/20.SABRA stated needs Dr.John to complete some paperwork, stated he will be dropping it off today wanted to know if he could start on it early, patients son also stated that she will need a  FL2 form completed

## 2024-04-23 ENCOUNTER — Ambulatory Visit: Admitting: Internal Medicine

## 2024-04-23 ENCOUNTER — Encounter: Payer: Self-pay | Admitting: Internal Medicine

## 2024-04-23 VITALS — BP 124/82 | HR 53 | Temp 98.6°F | Ht 67.0 in

## 2024-04-23 DIAGNOSIS — K5900 Constipation, unspecified: Secondary | ICD-10-CM

## 2024-04-23 DIAGNOSIS — I509 Heart failure, unspecified: Secondary | ICD-10-CM

## 2024-04-23 DIAGNOSIS — Z0000 Encounter for general adult medical examination without abnormal findings: Secondary | ICD-10-CM

## 2024-04-23 DIAGNOSIS — H259 Unspecified age-related cataract: Secondary | ICD-10-CM

## 2024-04-23 DIAGNOSIS — Z23 Encounter for immunization: Secondary | ICD-10-CM

## 2024-04-23 DIAGNOSIS — Z8673 Personal history of transient ischemic attack (TIA), and cerebral infarction without residual deficits: Secondary | ICD-10-CM

## 2024-04-23 DIAGNOSIS — Z0001 Encounter for general adult medical examination with abnormal findings: Secondary | ICD-10-CM

## 2024-04-23 NOTE — Progress Notes (Signed)
 Patient ID: [name], female   DOB: 01/01/1946, 78 y.o.   MRN: [id number]         Chief Complaint:: wellness exam and bilateral cataract, constipation, hx of stroke with right side weakness - bedridden       HPI:  [name] is a 78 y.o. female here for wellness exam, for flu shot today, for tdap, shingrix at pharmacy, declines DXA, but ok for optho referral due to recent onset bilateral cataracts, o/w up to date                        Also seen at ED recently sept 24 with concern for uti, but culture with contamination only.  Pt denies chest pain, increased sob or doe, wheezing, orthopnea, PND, increased LE swelling, palpitations, dizziness or syncope.   Pt denies polydipsia, polyuria, or new focal neuro s/s.    Pt denies fever, wt loss, night sweats, loss of appetite, or other constitutional symptoms  Has been bedridden for over 1 yr, has been at 3 long term care facilities over the past 2 yrs, but son was concerned about the level of care, and the right post leg wound now 80% healed but still going to wound clinic.  Currently has nurse visit 3 x /wk.  Pt has right sided weakness but able to feed herself.  Needs assist with toileting, med admin, and dressing.  Does have mild intermittent constipation  Pt alert and declines lab today.   Wt Readings from Last 3 Encounters:  03/08/24 149 lb (67.6 kg)  01/18/24 149 lb 14.6 oz (68 kg)  01/06/24 149 lb 14.6 oz (68 kg)   BP Readings from Last 3 Encounters:  04/23/24 124/82  03/28/24 130/88  03/08/24 (!) 141/81   Immunization History  Administered Date(s) Administered   Fluad Quad(high Dose 65+) 04/13/2021   INFLUENZA, HIGH DOSE SEASONAL PF 04/20/2017, 08/18/2018, 04/23/2024   PFIZER(Purple Top)SARS-COV-2 Vaccination 08/11/2019, 09/01/2019   PPD Test 06/30/2015   Pneumococcal Conjugate-13 10/26/2017   Pneumococcal Polysaccharide-23 01/30/2020, 06/22/2022   Health Maintenance Due  Topic Date Due   DTaP/Tdap/Td (1 - Tdap) Never done    Zoster Vaccines- Shingrix (1 of 2) Never done   DEXA SCAN  Never done   OPHTHALMOLOGY EXAM  03/13/2024      Past Medical History:  Diagnosis Date   Acute blood loss anemia    CHF (congestive heart failure) (HCC)    EF 50-55%, grade 1 diastolic dysfunction per echo 89/7983   Coronary artery disease involving native artery of transplanted heart without angina pectoris    CVA (cerebral infarction) 04/2015   Started Plavix  04/2015   Depression    Diabetes (HCC) 2016   Type II. On insulin    Enchondroma of bone 2011   left femur.    Fracture, intertrochanteric, right femur (HCC)    History of lower GI bleeding    Hyperlipidemia    Hypertension    Patent foramen ovale 04/2015   Started on warfarin 04/2015   Protein calorie malnutrition    Stercoral ulcer of rectum 05/16/2015   Past Surgical History:  Procedure Laterality Date   CARDIAC SURGERY     Cath without stent   COLONOSCOPY N/A 05/15/2015   Procedure: COLONOSCOPY;  Surgeon: Gustav Shila GAILS, MD;  Location: [facility] ENDOSCOPY;  Service: Endoscopy;  Laterality: N/A;   FLEXIBLE SIGMOIDOSCOPY N/A 05/15/2015   Procedure: FLEXIBLE SIGMOIDOSCOPY;  Surgeon: Gustav Shila GAILS, MD;  Location: [facility] ENDOSCOPY;  Service: Endoscopy;  Laterality: N/A;  at bedside   INTRAMEDULLARY (IM) NAIL INTERTROCHANTERIC Right 06/12/2015   Procedure: INTRAMEDULLARY (IM) NAIL RIGHT HIP;  Surgeon: Evalene JONETTA Chancy, MD;  Location: [facility] OR;  Service: Orthopedics;  Laterality: Right;   TEE WITHOUT CARDIOVERSION N/A 05/05/2015   Procedure: TRANSESOPHAGEAL ECHOCARDIOGRAM (TEE);  Surgeon: Salena Negri, MD;  Location: [facility] ENDOSCOPY;  Service: Cardiovascular;  Laterality: N/A;    reports that she quit smoking about 9 years ago. Her smoking use included cigarettes. She has never used smokeless tobacco. She reports that she does not drink alcohol and does not use drugs. family history includes Heart attack in her father; Heart failure in her mother; Hyperlipidemia in her  mother; Hypertension in her mother. Allergies  Allergen Reactions   Porcine (Pork) Protein-Containing Drug Products Other (See Comments)    To keep blood pressure down   Current Outpatient Medications on File Prior to Visit  Medication Sig Dispense Refill   acetaminophen  (TYLENOL ) 325 MG tablet Take 2 tablets (650 mg total) by mouth every 6 (six) hours as needed. 45 tablet 0   atorvastatin  (LIPITOR) 20 MG tablet Take 1 tablet (20 mg total) by mouth daily. 30 tablet 0   Cholecalciferol  (VITAMIN D3) 50 MCG (2000 UT) TABS Take 1 tablet by mouth daily.     diclofenac  Sodium (VOLTAREN ) 1 % GEL Apply 4 g topically 4 (four) times daily. 100 g 0   ELIQUIS  5 MG TABS tablet Take 1 tablet (5 mg total) by mouth every 12 (twelve) hours. 60 tablet 0   EMBECTA AUTOSHIELD DUO 30G X 5 MM MISC      gentamicin cream (GARAMYCIN) 0.1 % Apply topically daily.     guaiFENesin  (ROBITUSSIN) 100 MG/5ML liquid Take 10 mLs by mouth every 4 (four) hours as needed for cough or to loosen phlegm.     levETIRAcetam  (KEPPRA ) 500 MG tablet Take 1 tablet (500 mg total) by mouth every 12 (twelve) hours. 60 tablet 0   losartan  (COZAAR ) 25 MG tablet Take 0.5 tablets (12.5 mg total) by mouth daily. 15 tablet 0   metoprolol  succinate (TOPROL -XL) 25 MG 24 hr tablet Take 1 tablet (25 mg total) by mouth daily. 30 tablet 0   polyethylene glycol (MIRALAX  / GLYCOLAX ) 17 g packet Take 17 g by mouth daily as needed for mild constipation or moderate constipation. Use Miralax  if Milk of Magnesia is contraindicated (Example: Dialysis resident) 14 each 0   TRESIBA  FLEXTOUCH 100 UNIT/ML FlexTouch Pen Inject 10 Units into the skin at bedtime. 15 mL 0   No current facility-administered medications on file prior to visit.        ROS:  All others reviewed and negative.  Objective        PE:  BP 124/82 (BP Location: Right Arm, Patient Position: Sitting, Cuff Size: Normal)   Pulse (!) 53   Temp 98.6 F (37 C) (Oral)   Ht 5' 7 (1.702 m)    SpO2 96%   BMI 23.34 kg/m                 Constitutional: Pt appears in NAD               HENT: Head: NCAT.                Right Ear: External ear normal.                 Left Ear: External ear normal.  Eyes: . Pupils are equal, round, and reactive to light. Conjunctivae and EOM are normal               Nose: without d/c or deformity               Neck: Neck supple. Gross normal ROM               Cardiovascular: Normal rate and regular rhythm.                 Pulmonary/Chest: Effort normal and breath sounds without rales or wheezing.                Abd:  Soft, NT, ND, + BS, no organomegaly               Neurological: Pt is alert. At baseline orientation, motor with dense right sided weakness               Skin: Skin is warm. LE edema - trace bilateral pedal               Psychiatric: Pt behavior is normal without agitation   Micro: none  Cardiac tracings I have personally interpreted today:  none  Pertinent Radiological findings (summarize): none   Lab Results  Component Value Date   WBC 10.0 03/28/2024   HGB 13.7 03/28/2024   HCT 44.7 03/28/2024   PLT 219 03/28/2024   GLUCOSE 102 (H) 03/28/2024   CHOL 130 02/10/2022   TRIG 152.0 (H) 02/10/2022   HDL 44.60 02/10/2022   LDLDIRECT 59.0 03/20/2020   LDLCALC 55 02/10/2022   ALT 35 03/28/2024   AST 33 03/28/2024   NA 144 03/28/2024   K 4.3 03/28/2024   CL 109 03/28/2024   CREATININE 0.72 03/28/2024   BUN 18 03/28/2024   CO2 23 03/28/2024   TSH 2.08 02/10/2022   INR 1.2 01/22/2024   HGBA1C 8.7 (H) 01/23/2024   Assessment/Plan:  EMELLY WURTZ is a 78 y.o. Black or African American [2] female with  has a past medical history of Acute blood loss anemia, CHF (congestive heart failure) (HCC), Coronary artery disease involving native artery of transplanted heart without angina pectoris, CVA (cerebral infarction) (04/2015), Depression, Diabetes (HCC) (2016), Enchondroma of bone (2011), Fracture, intertrochanteric,  right femur (HCC), History of lower GI bleeding, Hyperlipidemia, Hypertension, Patent foramen ovale (04/2015), Protein calorie malnutrition, and Stercoral ulcer of rectum (05/16/2015).  History of stroke Overall stable With right sided weakness overall stable, pt can feed herself but needs o/w near total care.  Son willing to do this, has bed in the living room, bsc, and hoyer lift already.  Cont liptior 20 every day.  [place] Access, and medicaid forms filled out.   Encounter for well adult exam with abnormal findings Age and sex appropriate education and counseling updated with regular exercise and diet Referrals for preventative services - for eye exam referral Immunizations addressed - for flu shot today Smoking counseling  - none needed Evidence for depression or other mood disorder - none significant Most recent labs reviewed. I have personally reviewed and have noted: 1) the patient's medical and social history 2) The patient's current medications and supplements 3) The patient's height, weight, and BMI have been recorded in the chart   Constipation Mild intermittent, for miralax  17 gm every day prn  Age-related cataract of both eyes With recent worsening, for referral optho  CHF (congestive heart failure) (HCC) Stable volume, cont current med tx  Followup:  Return in about 6 months (around 10/22/2024).  Lynwood Rush, MD 04/23/2024 12:57 PM [facility]

## 2024-04-23 NOTE — Assessment & Plan Note (Signed)
 Mild intermittent, for miralax  17 gm every day prn

## 2024-04-23 NOTE — Assessment & Plan Note (Signed)
Stable volume, cont current med tx 

## 2024-04-23 NOTE — Assessment & Plan Note (Addendum)
 Overall stable With right sided weakness overall stable, pt can feed herself but needs o/w near total care.  Son willing to do this, has bed in the living room, bsc, and hoyer lift already.  Cont liptior 20 every day.  [place] Access, and medicaid forms filled out.

## 2024-04-23 NOTE — Assessment & Plan Note (Signed)
 With recent worsening, for referral optho

## 2024-04-23 NOTE — Patient Instructions (Addendum)
 You had the flu shot today  Ok for Miralax  daily as needed  Please continue all other medications as before, and refills have been done if requested.  Please have the pharmacy call with any other refills you may need.  Please continue your efforts at being more active, low cholesterol diet, and weight control.  You are otherwise up to date with prevention measures today.  Please keep your appointments with your specialists as you may have planned  You will be contacted regarding the referral for: Eye doctor  We can hold on lab test today  We will have the forms filled out soon  Please make an Appointment to return in 6 months, or sooner if needed

## 2024-04-23 NOTE — Assessment & Plan Note (Signed)
 Age and sex appropriate education and counseling updated with regular exercise and diet Referrals for preventative services - for eye exam referral Immunizations addressed - for flu shot today Smoking counseling  - none needed Evidence for depression or other mood disorder - none significant Most recent labs reviewed. I have personally reviewed and have noted: 1) the patient's medical and social history 2) The patient's current medications and supplements 3) The patient's height, weight, and BMI have been recorded in the chart

## 2024-04-29 ENCOUNTER — Inpatient Hospital Stay (HOSPITAL_COMMUNITY)
Admission: EM | Admit: 2024-04-29 | Discharge: 2024-05-06 | DRG: 871 | Disposition: A | Discharge status: Home Health | Attending: Internal Medicine | Admitting: Internal Medicine

## 2024-04-29 ENCOUNTER — Emergency Department (HOSPITAL_COMMUNITY)

## 2024-04-29 ENCOUNTER — Encounter (HOSPITAL_COMMUNITY): Payer: Self-pay

## 2024-04-29 ENCOUNTER — Other Ambulatory Visit: Payer: Self-pay

## 2024-04-29 DIAGNOSIS — B952 Enterococcus as the cause of diseases classified elsewhere: Secondary | ICD-10-CM | POA: Diagnosis present

## 2024-04-29 DIAGNOSIS — I69351 Hemiplegia and hemiparesis following cerebral infarction affecting right dominant side: Secondary | ICD-10-CM | POA: Diagnosis not present

## 2024-04-29 DIAGNOSIS — I110 Hypertensive heart disease with heart failure: Secondary | ICD-10-CM | POA: Diagnosis present

## 2024-04-29 DIAGNOSIS — G40209 Localization-related (focal) (partial) symptomatic epilepsy and epileptic syndromes with complex partial seizures, not intractable, without status epilepticus: Secondary | ICD-10-CM | POA: Diagnosis present

## 2024-04-29 DIAGNOSIS — L89892 Pressure ulcer of other site, stage 2: Secondary | ICD-10-CM | POA: Diagnosis present

## 2024-04-29 DIAGNOSIS — M21371 Foot drop, right foot: Secondary | ICD-10-CM | POA: Diagnosis present

## 2024-04-29 DIAGNOSIS — I2489 Other forms of acute ischemic heart disease: Secondary | ICD-10-CM | POA: Diagnosis present

## 2024-04-29 DIAGNOSIS — R6521 Severe sepsis with septic shock: Secondary | ICD-10-CM | POA: Diagnosis present

## 2024-04-29 DIAGNOSIS — N200 Calculus of kidney: Secondary | ICD-10-CM | POA: Diagnosis present

## 2024-04-29 DIAGNOSIS — R6520 Severe sepsis without septic shock: Secondary | ICD-10-CM | POA: Diagnosis not present

## 2024-04-29 DIAGNOSIS — K5900 Constipation, unspecified: Secondary | ICD-10-CM | POA: Diagnosis present

## 2024-04-29 DIAGNOSIS — Q2112 Patent foramen ovale: Secondary | ICD-10-CM

## 2024-04-29 DIAGNOSIS — Z79899 Other long term (current) drug therapy: Secondary | ICD-10-CM

## 2024-04-29 DIAGNOSIS — D696 Thrombocytopenia, unspecified: Secondary | ICD-10-CM | POA: Diagnosis not present

## 2024-04-29 DIAGNOSIS — R112 Nausea with vomiting, unspecified: Secondary | ICD-10-CM

## 2024-04-29 DIAGNOSIS — L89609 Pressure ulcer of unspecified heel, unspecified stage: Secondary | ICD-10-CM | POA: Diagnosis not present

## 2024-04-29 DIAGNOSIS — G9341 Metabolic encephalopathy: Secondary | ICD-10-CM | POA: Diagnosis present

## 2024-04-29 DIAGNOSIS — E1165 Type 2 diabetes mellitus with hyperglycemia: Secondary | ICD-10-CM | POA: Diagnosis present

## 2024-04-29 DIAGNOSIS — F0390 Unspecified dementia without behavioral disturbance: Secondary | ICD-10-CM | POA: Diagnosis present

## 2024-04-29 DIAGNOSIS — Z87891 Personal history of nicotine dependence: Secondary | ICD-10-CM

## 2024-04-29 DIAGNOSIS — R7989 Other specified abnormal findings of blood chemistry: Secondary | ICD-10-CM | POA: Diagnosis not present

## 2024-04-29 DIAGNOSIS — E872 Acidosis, unspecified: Secondary | ICD-10-CM | POA: Diagnosis present

## 2024-04-29 DIAGNOSIS — N319 Neuromuscular dysfunction of bladder, unspecified: Secondary | ICD-10-CM | POA: Diagnosis present

## 2024-04-29 DIAGNOSIS — I5032 Chronic diastolic (congestive) heart failure: Secondary | ICD-10-CM | POA: Diagnosis present

## 2024-04-29 DIAGNOSIS — A4152 Sepsis due to Pseudomonas: Secondary | ICD-10-CM | POA: Diagnosis not present

## 2024-04-29 DIAGNOSIS — N179 Acute kidney failure, unspecified: Secondary | ICD-10-CM | POA: Diagnosis present

## 2024-04-29 DIAGNOSIS — S81801A Unspecified open wound, right lower leg, initial encounter: Secondary | ICD-10-CM

## 2024-04-29 DIAGNOSIS — S81801D Unspecified open wound, right lower leg, subsequent encounter: Secondary | ICD-10-CM

## 2024-04-29 DIAGNOSIS — L8990 Pressure ulcer of unspecified site, unspecified stage: Secondary | ICD-10-CM | POA: Insufficient documentation

## 2024-04-29 DIAGNOSIS — Z8249 Family history of ischemic heart disease and other diseases of the circulatory system: Secondary | ICD-10-CM

## 2024-04-29 DIAGNOSIS — E876 Hypokalemia: Secondary | ICD-10-CM | POA: Diagnosis present

## 2024-04-29 DIAGNOSIS — Z1152 Encounter for screening for COVID-19: Secondary | ICD-10-CM

## 2024-04-29 DIAGNOSIS — B964 Proteus (mirabilis) (morganii) as the cause of diseases classified elsewhere: Secondary | ICD-10-CM | POA: Diagnosis present

## 2024-04-29 DIAGNOSIS — Z7901 Long term (current) use of anticoagulants: Secondary | ICD-10-CM | POA: Diagnosis not present

## 2024-04-29 DIAGNOSIS — R609 Edema, unspecified: Secondary | ICD-10-CM | POA: Diagnosis not present

## 2024-04-29 DIAGNOSIS — A4159 Other Gram-negative sepsis: Secondary | ICD-10-CM | POA: Diagnosis not present

## 2024-04-29 DIAGNOSIS — I5A Non-ischemic myocardial injury (non-traumatic): Secondary | ICD-10-CM | POA: Diagnosis present

## 2024-04-29 DIAGNOSIS — I509 Heart failure, unspecified: Secondary | ICD-10-CM | POA: Diagnosis not present

## 2024-04-29 DIAGNOSIS — Z794 Long term (current) use of insulin: Secondary | ICD-10-CM

## 2024-04-29 DIAGNOSIS — A4150 Gram-negative sepsis, unspecified: Secondary | ICD-10-CM | POA: Diagnosis present

## 2024-04-29 DIAGNOSIS — G40909 Epilepsy, unspecified, not intractable, without status epilepticus: Secondary | ICD-10-CM | POA: Diagnosis not present

## 2024-04-29 DIAGNOSIS — J90 Pleural effusion, not elsewhere classified: Secondary | ICD-10-CM | POA: Diagnosis present

## 2024-04-29 DIAGNOSIS — R7881 Bacteremia: Secondary | ICD-10-CM

## 2024-04-29 DIAGNOSIS — N136 Pyonephrosis: Secondary | ICD-10-CM | POA: Diagnosis present

## 2024-04-29 DIAGNOSIS — A419 Sepsis, unspecified organism: Secondary | ICD-10-CM | POA: Diagnosis not present

## 2024-04-29 DIAGNOSIS — I4891 Unspecified atrial fibrillation: Secondary | ICD-10-CM | POA: Diagnosis present

## 2024-04-29 DIAGNOSIS — L89619 Pressure ulcer of right heel, unspecified stage: Secondary | ICD-10-CM | POA: Diagnosis present

## 2024-04-29 DIAGNOSIS — N210 Calculus in bladder: Secondary | ICD-10-CM | POA: Diagnosis present

## 2024-04-29 DIAGNOSIS — Z91014 Allergy to mammalian meats: Secondary | ICD-10-CM

## 2024-04-29 DIAGNOSIS — Z83438 Family history of other disorder of lipoprotein metabolism and other lipidemia: Secondary | ICD-10-CM

## 2024-04-29 DIAGNOSIS — I2510 Atherosclerotic heart disease of native coronary artery without angina pectoris: Secondary | ICD-10-CM | POA: Diagnosis present

## 2024-04-29 DIAGNOSIS — N390 Urinary tract infection, site not specified: Secondary | ICD-10-CM | POA: Diagnosis not present

## 2024-04-29 DIAGNOSIS — D649 Anemia, unspecified: Secondary | ICD-10-CM | POA: Diagnosis not present

## 2024-04-29 DIAGNOSIS — D6959 Other secondary thrombocytopenia: Secondary | ICD-10-CM | POA: Diagnosis present

## 2024-04-29 DIAGNOSIS — Z7401 Bed confinement status: Secondary | ICD-10-CM

## 2024-04-29 DIAGNOSIS — E785 Hyperlipidemia, unspecified: Secondary | ICD-10-CM | POA: Diagnosis present

## 2024-04-29 DIAGNOSIS — E8809 Other disorders of plasma-protein metabolism, not elsewhere classified: Secondary | ICD-10-CM | POA: Diagnosis present

## 2024-04-29 DIAGNOSIS — M21372 Foot drop, left foot: Secondary | ICD-10-CM | POA: Diagnosis present

## 2024-04-29 DIAGNOSIS — E119 Type 2 diabetes mellitus without complications: Secondary | ICD-10-CM | POA: Diagnosis not present

## 2024-04-29 LAB — URINALYSIS, W/ REFLEX TO CULTURE (INFECTION SUSPECTED)
Bilirubin Urine: NEGATIVE
Glucose, UA: 150 mg/dL — AB
Ketones, ur: NEGATIVE mg/dL
Nitrite: NEGATIVE
Protein, ur: 100 mg/dL — AB
RBC / HPF: >50 RBC/hpf (ref 0–5)
Specific Gravity, Urine: 1.018 (ref 1.005–1.030)
WBC, UA: >50 WBC/hpf (ref 0–5)
pH: 8 (ref 5.0–8.0)

## 2024-04-29 LAB — COMPREHENSIVE METABOLIC PANEL WITH GFR
ALT: 28 U/L (ref 0–44)
AST: 39 U/L (ref 15–41)
Albumin: 3.5 g/dL (ref 3.5–5.0)
Alkaline Phosphatase: 97 U/L (ref 38–126)
Anion gap: 18 — ABNORMAL HIGH (ref 5–15)
BUN: 35 mg/dL — ABNORMAL HIGH (ref 8–23)
CO2: 16 mmol/L — ABNORMAL LOW (ref 22–32)
Calcium: 9.7 mg/dL (ref 8.9–10.3)
Chloride: 102 mmol/L (ref 98–111)
Creatinine, Ser: 1.54 mg/dL — ABNORMAL HIGH (ref 0.44–1.00)
GFR, Estimated: 34 mL/min — ABNORMAL LOW (ref >60)
Glucose, Bld: 305 mg/dL — ABNORMAL HIGH (ref 70–99)
Potassium: 4.5 mmol/L (ref 3.5–5.1)
Sodium: 136 mmol/L (ref 135–145)
Total Bilirubin: 0.7 mg/dL (ref 0.0–1.2)
Total Protein: 7.1 g/dL (ref 6.5–8.1)

## 2024-04-29 LAB — CBC WITH DIFFERENTIAL/PLATELET
Abs Immature Granulocytes: 0.25 K/uL — ABNORMAL HIGH (ref 0.00–0.07)
Basophils Absolute: 0.1 K/uL (ref 0.0–0.1)
Basophils Relative: 0 %
Eosinophils Absolute: 0.1 K/uL (ref 0.0–0.5)
Eosinophils Relative: 0 %
HCT: 45.3 % (ref 36.0–46.0)
Hemoglobin: 14.2 g/dL (ref 12.0–15.0)
Immature Granulocytes: 1 %
Lymphocytes Relative: 4 %
Lymphs Abs: 0.9 K/uL (ref 0.7–4.0)
MCH: 27.4 pg (ref 26.0–34.0)
MCHC: 31.3 g/dL (ref 30.0–36.0)
MCV: 87.5 fL (ref 80.0–100.0)
Monocytes Absolute: 0.9 K/uL (ref 0.1–1.0)
Monocytes Relative: 4 %
Neutro Abs: 21.6 K/uL — ABNORMAL HIGH (ref 1.7–7.7)
Neutrophils Relative %: 91 %
Platelets: 190 K/uL (ref 150–400)
RBC: 5.18 MIL/uL — ABNORMAL HIGH (ref 3.87–5.11)
RDW: 13.1 % (ref 11.5–15.5)
Smear Review: NORMAL
WBC: 23.8 K/uL — ABNORMAL HIGH (ref 4.0–10.5)
nRBC: 0 % (ref 0.0–0.2)

## 2024-04-29 LAB — RESP PANEL BY RT-PCR (RSV, FLU A&B, COVID)  RVPGX2
Influenza A by PCR: NEGATIVE
Influenza B by PCR: NEGATIVE
Resp Syncytial Virus by PCR: NEGATIVE
SARS Coronavirus 2 by RT PCR: NEGATIVE

## 2024-04-29 LAB — I-STAT CG4 LACTIC ACID, ED
Lactic Acid, Venous: 11 mmol/L (ref 0.5–1.9)
Lactic Acid, Venous: 12 mmol/L (ref 0.5–1.9)

## 2024-04-29 LAB — BRAIN NATRIURETIC PEPTIDE: B Natriuretic Peptide: 517.4 pg/mL — ABNORMAL HIGH (ref 0.0–100.0)

## 2024-04-29 LAB — TROPONIN I (HIGH SENSITIVITY)
Troponin I (High Sensitivity): 96 ng/L — ABNORMAL HIGH (ref <18)
Troponin I (High Sensitivity): 91 ng/L — ABNORMAL HIGH (ref <18)

## 2024-04-29 LAB — LACTIC ACID, PLASMA: Lactic Acid, Venous: >9 mmol/L (ref 0.5–1.9)

## 2024-04-29 MED ORDER — VANCOMYCIN HCL 1250 MG/250ML IV SOLN
1250.0000 mg | Freq: Once | INTRAVENOUS | Status: AC
  Administered 2024-04-29: 1250 mg via INTRAVENOUS
  Filled 2024-04-29: qty 250

## 2024-04-29 MED ORDER — LACTATED RINGERS IV BOLUS (SEPSIS)
1000.0000 mL | Freq: Once | INTRAVENOUS | Status: AC
  Administered 2024-04-29: 1000 mL via INTRAVENOUS

## 2024-04-29 MED ORDER — PIPERACILLIN-TAZOBACTAM 3.375 G IVPB 30 MIN
3.3750 g | Freq: Once | INTRAVENOUS | Status: AC
  Administered 2024-04-29: 3.375 g via INTRAVENOUS
  Filled 2024-04-29: qty 50

## 2024-04-29 MED ORDER — PIPERACILLIN-TAZOBACTAM 3.375 G IVPB
3.3750 g | Freq: Three times a day (TID) | INTRAVENOUS | Status: DC
  Administered 2024-04-30 (×2): 3.375 g via INTRAVENOUS
  Filled 2024-04-29 (×2): qty 50

## 2024-04-29 MED ORDER — LACTATED RINGERS IV BOLUS
500.0000 mL | Freq: Once | INTRAVENOUS | Status: AC
  Administered 2024-04-29: 500 mL via INTRAVENOUS

## 2024-04-29 MED ORDER — IOHEXOL 350 MG/ML SOLN
75.0000 mL | Freq: Once | INTRAVENOUS | Status: AC | PRN
  Administered 2024-04-29: 75 mL via INTRAVENOUS

## 2024-04-29 MED ORDER — LACTATED RINGERS IV SOLN: INTRAVENOUS | Status: AC

## 2024-04-29 NOTE — ED Triage Notes (Addendum)
 Pt here with reports of sudden onset LLQ abdominal pain last night. Now with vomiting. Unable to tolerate PO. Hx CVA with R sided deficits. Wound nurse treating pt for pressure would on leg.

## 2024-04-29 NOTE — Sepsis Progress Note (Signed)
 Elink monitoring for the code sepsis protocol.

## 2024-04-29 NOTE — H&P (Incomplete)
 History and Physical    LACRESHA Cameron FMW:[id number] DOB: 1945-08-17 DOA: 04/29/2024  PCP: Norleen Lynwood ORN, MD   Patient coming from: {Blank single:19197::Home,Clinic,SNF,ALF,Group Home,Hospice,***}   Chief Complaint:  Chief Complaint  Patient presents with   Abdominal Pain    HPI:  [name] is a 78 y.o. female with hx of ***    Review of Systems:  ROS complete and negative except as marked above   Allergies  Allergen Reactions   Porcine (Pork) Protein-Containing Drug Products Other (See Comments)    To keep blood pressure down    Prior to Admission medications   Medication Sig Start Date End Date Taking? Authorizing Provider  acetaminophen  (TYLENOL ) 325 MG tablet Take 2 tablets (650 mg total) by mouth every 6 (six) hours as needed. 03/28/24  Yes Dean Clarity, MD  atorvastatin  (LIPITOR) 20 MG tablet Take 1 tablet (20 mg total) by mouth daily. 03/28/24  Yes Dean Clarity, MD  ELIQUIS  5 MG TABS tablet Take 1 tablet (5 mg total) by mouth every 12 (twelve) hours. 03/28/24  Yes Dean Clarity, MD  gentamicin cream (GARAMYCIN) 0.1 % Apply 1 Application topically daily. 04/04/24  Yes [provider]  levETIRAcetam  (KEPPRA ) 500 MG tablet Take 1 tablet (500 mg total) by mouth every 12 (twelve) hours. 03/28/24  Yes Dean Clarity, MD  losartan  (COZAAR ) 25 MG tablet Take 0.5 tablets (12.5 mg total) by mouth daily. 03/28/24  Yes Dean Clarity, MD  metoprolol  succinate (TOPROL -XL) 25 MG 24 hr tablet Take 1 tablet (25 mg total) by mouth daily. 03/28/24  Yes Haviland, Julie, MD  polyethylene glycol (MIRALAX  / GLYCOLAX ) 17 g packet Take 17 g by mouth daily as needed for mild constipation or moderate constipation. Use Miralax  if Milk of Magnesia is contraindicated (Example: Dialysis resident) 03/28/24  Yes Dean Clarity, MD  TRESIBA  FLEXTOUCH 100 UNIT/ML FlexTouch Pen Inject 10 Units into the skin at bedtime. 03/29/24  Yes Towana Ozell BROCKS, MD  diclofenac  Sodium  (VOLTAREN ) 1 % GEL Apply 4 g topically 4 (four) times daily. 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 On insulin    Enchondroma of bone 2011   left femur.    Fracture, intertrochanteric, right femur (HCC)    History of lower GI bleeding    Hyperlipidemia    Hypertension    Patent foramen ovale 04/2015   Started on warfarin 04/2015   Protein calorie malnutrition    Stercoral ulcer of rectum 05/16/2015    Past Surgical History:  Procedure Laterality Date   CARDIAC SURGERY     Cath without stent   COLONOSCOPY N/A 05/15/2015   Procedure: COLONOSCOPY;  Surgeon: Gustav Shila GAILS, MD;  Location: [facility] ENDOSCOPY;  Service: Endoscopy;  Laterality: N/A;   FLEXIBLE SIGMOIDOSCOPY N/A 05/15/2015   Procedure: FLEXIBLE SIGMOIDOSCOPY;  Surgeon: Gustav Shila GAILS, MD;  Location: [facility] ENDOSCOPY;  Service: Endoscopy;  Laterality: N/A;  at bedside   INTRAMEDULLARY (IM) NAIL INTERTROCHANTERIC Right 06/12/2015   Procedure: INTRAMEDULLARY (IM) NAIL RIGHT HIP;  Surgeon: Evalene JONETTA Chancy, MD;  Location:  [facility] OR;  Service: Orthopedics;  Laterality: Right;   TEE WITHOUT CARDIOVERSION N/A 05/05/2015   Procedure: TRANSESOPHAGEAL ECHOCARDIOGRAM (TEE);  Surgeon: Salena Negri, MD;  Location: [facility] ENDOSCOPY;  Service: Cardiovascular;  Laterality: N/A;     reports that she quit smoking about 9 years ago. Her smoking use included cigarettes. She has never used smokeless tobacco. She reports that she does not drink alcohol and does not use drugs.  Family History  Problem Relation Age of Onset   Hypertension Mother     Heart failure Mother    Hyperlipidemia Mother    Heart attack Father      Physical Exam: Vitals:   04/29/24 1547 04/29/24 1830 04/29/24 1937 04/29/24 2231  BP: 123/65 129/70  128/81  Pulse: (!) 117 99  (!) 102  Resp: (!) 26 20  16   Temp: 99.7 F (37.6 C)  98.7 F (37.1 C)   TempSrc: Oral  Oral   SpO2: 100% 100%  100%    Gen: Awake, alert, NAD ***  CV: Regular, normal S1, S2, no murmurs  Resp: Normal WOB, CTAB  Abd: Flat, normoactive, nontender MSK: Symmetric, no edema  Skin: No rashes or lesions to exposed skin  Neuro: Alert and interactive  Psych: euthymic, appropriate    Data review:   Labs reviewed, notable for:  ***  Micro:  Results for orders placed or performed during the hospital encounter of 04/29/24  Culture, blood (Routine x 2)     Status: None (Preliminary result)   Collection Time: 04/29/24  3:52 PM   Specimen: BLOOD RIGHT FOREARM  Result Value Ref Range Status   Specimen Description BLOOD RIGHT FOREARM  Final   Special Requests   Final    BOTTLES DRAWN AEROBIC AND ANAEROBIC Blood Culture adequate volume Performed at [facility], [address], [place], [place] [address]    Culture PENDING  Incomplete   Report Status PENDING  Incomplete  Resp panel by RT-PCR (RSV, Flu A&B, Covid) Anterior Nasal Swab     Status: None   Collection Time: 04/29/24  5:07 PM   Specimen: Anterior Nasal Swab  Result Value Ref Range Status   SARS Coronavirus 2 by RT PCR NEGATIVE NEGATIVE Final   Influenza A by PCR NEGATIVE NEGATIVE Final   Influenza B by PCR NEGATIVE NEGATIVE Final    Comment: (NOTE) The Xpert Xpress SARS-CoV-2/FLU/RSV plus assay is intended as an aid in the diagnosis of influenza from Nasopharyngeal swab specimens and should not be used as a sole basis for treatment. Nasal washings and aspirates are unacceptable for Xpert Xpress SARS-CoV-2/FLU/RSV testing.  Fact Sheet for Patients: bloggercourse.com  Fact Sheet for  Healthcare Providers: seriousbroker.it  This test is not yet approved or cleared by the [place]  FDA and has been authorized for detection and/or diagnosis of SARS-CoV-2 by FDA under an Emergency Use Authorization (EUA). This EUA will remain in effect (meaning this test can be used) for the duration of the COVID-19 declaration under Section 564(b)(1) of the Act, 21 U.S.C. section 360bbb-3(b)(1), unless the authorization is terminated or revoked.     Resp Syncytial Virus by PCR NEGATIVE NEGATIVE Final    Comment: (NOTE) Fact Sheet for Patients: bloggercourse.com  Fact Sheet for Healthcare Providers: seriousbroker.it  This test is not yet approved or cleared by the [place]  FDA and has been authorized for detection and/or diagnosis of SARS-CoV-2 by FDA under an Emergency Use Authorization (EUA). This EUA will remain in effect (meaning this test can be  used) for the duration of the COVID-19 declaration under Section 564(b)(1) of the Act, 21 U.S.C. section 360bbb-3(b)(1), unless the authorization is terminated or revoked.  Performed at [facility], [address] [place], [place] [address]   Culture, blood (Routine x 2)     Status: None (Preliminary result)   Collection Time: 04/29/24  5:23 PM   Specimen: BLOOD LEFT ARM  Result Value Ref Range Status   Specimen Description BLOOD LEFT ARM  Final   Special Requests   Final    AEROBIC BOTTLE ONLY Blood Culture results may not be optimal due to an inadequate volume of blood received in culture bottles Performed at [facility], [address] [place], [place] [address]    Culture PENDING  Incomplete   Report Status PENDING  Incomplete    Imaging reviewed:  CT Chest W Contrast Result Date: 04/29/2024 EXAM: CT CHEST WITH CONTRAST 04/29/2024 08:51:46 PM TECHNIQUE: CT of the chest was performed with the administration of 75 mL of  iohexol (OMNIPAQUE) 350 MG/ML injection. Multiplanar reformatted images are provided for review. Automated exposure control, iterative reconstruction, and/or weight based adjustment of the mA/kV was utilized to reduce the radiation dose to as low as reasonably achievable. COMPARISON: 12/31/2007 CLINICAL HISTORY: FINDINGS: MEDIASTINUM: Mild cardiomegaly. Three-vessel coronary artery calcifications. Thoracic aortic atherosclerosis. Right pulmonary artery enlargement, suggestive of pulmonary hypertension. The central airways are clear. LYMPH NODES: No mediastinal, hilar or axillary lymphadenopathy. LUNGS AND PLEURA: Small left pleural effusion. Patchy bilateral lower lobe opacities, left greater than right, suggesting atelectasis. Mild patchy opacities in the posterior upper lobes and more likely atelectasis. Possible 8 mm subpleural nodular opacity at the left lung apex (image 27). No pneumothorax. SOFT TISSUES/BONES: No acute abnormality of the bones or soft tissues. UPPER ABDOMEN: Limited images of the upper abdomen demonstrates no acute abnormality. IMPRESSION: 1. Mild cardiomegaly. No frank interstitial edema. 2. Small left pleural effusion. 3. Equivocal 8 mm subpleural nodular opacity at the left lung apex, possibly infectious/inflammatory. Consider follow-up CT chest in 3 months to assess for persistence. 4. Suspected pulmonary arterial hypertension. Electronically signed by: Pinkie Pebbles MD 04/29/2024 09:24 PM EDT RP Workstation: [id number]   CT Angio Abd/Pel W and/or Wo Contrast Result Date: 04/29/2024 EXAM: CTA ABDOMEN AND PELVIS WITH CONTRAST 04/29/2024 08:51:46 PM TECHNIQUE: CTA images of the abdomen and pelvis with intravenous contrast. Three-dimensional MIP/volume rendered formations were performed. Automated exposure control, iterative reconstruction, and/or weight based adjustment of the mA/kV was utilized to reduce the radiation dose to as low as reasonably achievable. COMPARISON: 11/22/2017  CLINICAL HISTORY: FINDINGS: VASCULATURE: AORTA: Atherosclerotic calcifications of the aorta, although patent. No acute finding. No abdominal aortic aneurysm. No dissection. CELIAC TRUNK: No acute finding. No occlusion or significant stenosis. SUPERIOR MESENTERIC ARTERY: Atherosclerotic calcifications of the origin of the SMA, patent. No acute finding. No occlusion or significant stenosis. RENAL ARTERIES: Atherosclerotic calcifications of the origin of the bilateral renal arteries, patent. No acute finding. No occlusion or significant stenosis. ILIAC ARTERIES: Atherosclerotic calcifications of the bilateral iliac arteries, patent. No acute finding. No occlusion or significant stenosis. LIVER: The liver is unremarkable. GALLBLADDER AND BILE DUCTS: Layering gallstones, without associated inflammatory changes. No biliary ductal dilatation. SPLEEN: The spleen is unremarkable. PANCREAS: The pancreas is unremarkable. ADRENAL GLANDS: Bilateral adrenal glands demonstrate no acute abnormality. KIDNEYS, URETERS AND BLADDER: Subcentimeter bilateral upper pole renal cysts, benign. No follow-up is recommended. Suspected 6 mm nonobstructing interpolar left renal calculus (image 79). Mild bilateral hydroureteronephrosis, new from remote  prior, but possibly reflecting neurogenic bladder / chronic bladder outlet obstruction. No stones in the ureters. No perinephric or periureteral stranding. GI AND BOWEL: Moderate hiatal hernia. Mild rectal fecal impaction, without inflammatory changes to suggest stercoral colitis. No secondary wall thickening or inflammatory changes involving bowel to suggest ischemia. Stomach and duodenal sweep demonstrate no acute abnormality. There is no bowel obstruction. No abnormal bowel wall thickening or distension. REPRODUCTIVE: Status post hysterectomy. PERITONEUM AND RETRPERITONEUM: No ascites or free air. LUNG BASE: No acute abnormality. LYMPH NODES: No lymphadenopathy. BONES AND SOFT TISSUES: Status  post ORIF of the right hip. Small fat-containing bilateral inguinal hernias. No acute abnormality of the bones. No acute soft tissue abnormality. IMPRESSION: 1. Mesenteric vessels are patent.  No findings to suggest mesenteric ischemia. 2. Mild diffuse irregular bladder wall thickening with perivesical stranding, favoring cystitis. Consider cystoscopy for further evaluation, as clinically warranted. 3. Mild bilateral hydroureteronephrosis, possibly reflecting neurogenic bladder or chronic bladder outlet obstruction. 4. 6 mm nonobstructing right renal calculus. Layering tiny bladder calculi measuring up to 2 mm, poorly visualized. Electronically signed by: Pinkie Pebbles MD 04/29/2024 09:07 PM EDT RP Workstation: [id number]   DG Tibia/Fibula Right Result Date: 04/29/2024 CLINICAL DATA:  Leg wound, concern for osteomyelitis. EXAM: RIGHT TIBIA AND FIBULA - 2 VIEW COMPARISON:  10/13/2022. FINDINGS: There is diffusely decreased mineralization of the bones. Fixation hardware is present in the distal femur with old healed fracture deformity. There is bony deformity of the medial tibial plateau, compatible with known old fracture. No periosteal elevation or bony erosions. Vascular calcifications are noted. There is moderate calcaneal spurring. Soft tissue swelling is present at the right lower extremity laterally. IMPRESSION: No radiographic evidence of osteomyelitis Electronically Signed   By: Leita Birmingham M.D.   On: 04/29/2024 17:48   DG Chest Port 1 View Result Date: 04/29/2024 EXAM: 1 VIEW(S) XRAY OF THE CHEST 04/29/2024 04:13:00 PM COMPARISON: 01/19/2024 CLINICAL HISTORY: Suspected sepsis FINDINGS: LUNGS AND PLEURA: Low lung volumes. Streaky opacities in left lung base. No pulmonary edema. No pleural effusion. No pneumothorax. HEART AND MEDIASTINUM: Aortic atherosclerosis. BONES AND SOFT TISSUES: No acute osseous abnormality. Small hiatal hernia. IMPRESSION: 1. Low lung volumes with streaky opacities in the  left lung base. Electronically signed by: Norleen Boxer MD 04/29/2024 04:52 PM EDT RP Workstation: [id number]    EKG: ***   ED Course: ***    Assessment/Plan:  78 y.o. female with hx ***   Sepsis suspect secondary to complicated urinary tract infection ***Micro review most recently has had pansensitive E faecalis prior results include a pansensitive Pseudomonas, amp resistant Kleb pneumonia.   There is no height or weight on file to calculate BMI. ***   DVT prophylaxis:  {Blank single:19197::Lovenox ,SQ Heparin ,IV heparin  gtts,Xarelto,Eliquis ,Coumadin ,SCDs,***} Code Status:  {Blank single:19197::Full Code,DNR with Intubation,DNR/DNI(Do NOT Intubate),Comfort Care,***} Diet:  Diet Orders (From admission, onward)     Start     Ordered   04/29/24 1707  Diet NPO time specified  (Septic presentation on arrival (screening labs, nursing and treatment orders for obvious sepsis))  Diet effective now        04/29/24 1707           Family Communication:  ***  Consults:  ***  Admission status:   {Blank single:19197::Observation,Inpatient}, {Blank single:19197::Med-Surg,Telemetry bed,Step Down Unit}  Severity of Illness: {Observation/Inpatient:21159}   Dorn Dawson, MD [facility]  How to contact the [facility] Attending or Consulting provider 7A - 7P or covering provider during after hours 7P -7A, for this patient.  Check the care team in [facility] and look for a) attending/consulting [facility] provider listed and b) the [facility] team listed Log into [email or web address] and use [facility]'s universal password to access. If you do not have the password, please contact the hospital operator. Locate the [facility] provider you are looking for under [facility] and page to a number that you can be directly reached. If you still have difficulty reaching the provider, please page the [facility] (Director on Call) for the Hospitalists listed on amion for assistance.  04/29/2024,  11:41 PM

## 2024-04-29 NOTE — ED Provider Notes (Signed)
 [place] EMERGENCY DEPARTMENT AT [facility] Provider Note   CSN: [id number] Arrival date & time: 04/29/24  1539     Patient presents with: Abdominal Pain   [name] is a 78 y.o. female.   Abdominal Pain Associated symptoms: nausea and shortness of breath   Patient is a 78 year old female presenting ED today for concerns for acute onset abdominal pain that began acutely last night.  Patient at mental baseline status according to son who is at bedside.  Patient reporting abdominal pain worse on right than left, lower abdominal pain.  Son notes that she had also been mildly more short of breath than her normal.  Currently seeing wound care for right lower extremity wound.  Patient reports pain improved at this time.  Notably had accompanying vomiting throughout the night, unable to tolerate p.o.  Called PCP and was told to bring her to the emergency department for evaluation.  Previous medical history of CHF, CVA with right-sided deficits currently anticoagulated Eliquis , CAD, HLD, HTN, diabetes, sepsis, CHF, stercoral ulcer of rectum, endochondroma of bone, AF.  Denies fever, headache, vision changes, chest pain, shortness of breath, hematemesis, dysuria, hematuria, melena, hematochezia, diarrhea, lower leg swelling.     Prior to Admission medications   Medication Sig Start Date End Date Taking? Authorizing Provider  acetaminophen  (TYLENOL ) 325 MG tablet Take 2 tablets (650 mg total) by mouth every 6 (six) hours as needed. 03/28/24  Yes Dean Clarity, MD  atorvastatin  (LIPITOR) 20 MG tablet Take 1 tablet (20 mg total) by mouth daily. 03/28/24  Yes Dean Clarity, MD  ELIQUIS  5 MG TABS tablet Take 1 tablet (5 mg total) by mouth every 12 (twelve) hours. 03/28/24  Yes Dean Clarity, MD  gentamicin cream (GARAMYCIN) 0.1 % Apply 1 Application topically daily. 04/04/24  Yes [provider]  levETIRAcetam  (KEPPRA ) 500 MG tablet Take 1 tablet (500 mg total) by  mouth every 12 (twelve) hours. 03/28/24  Yes Dean Clarity, MD  losartan  (COZAAR ) 25 MG tablet Take 0.5 tablets (12.5 mg total) by mouth daily. 03/28/24  Yes Dean Clarity, MD  metoprolol  succinate (TOPROL -XL) 25 MG 24 hr tablet Take 1 tablet (25 mg total) by mouth daily. 03/28/24  Yes Haviland, Julie, MD  polyethylene glycol (MIRALAX  / GLYCOLAX ) 17 g packet Take 17 g by mouth daily as needed for mild constipation or moderate constipation. Use Miralax  if Milk of Magnesia is contraindicated (Example: Dialysis resident) 03/28/24  Yes Dean Clarity, MD  TRESIBA  FLEXTOUCH 100 UNIT/ML FlexTouch Pen Inject 10 Units into the skin at bedtime. 03/29/24  Yes Towana Ozell BROCKS, MD  diclofenac  Sodium (VOLTAREN ) 1 % GEL Apply 4 g topically 4 (four) times daily. Patient not taking: Reported on 04/29/2024 03/28/24   Dean Clarity, MD    Allergies: Porcine (pork) protein-containing drug products    Review of Systems  Respiratory:  Positive for shortness of breath.   Gastrointestinal:  Positive for abdominal pain and nausea.  All other systems reviewed and are negative.   Updated Vital Signs BP 128/81   Pulse (!) 102   Temp 98.7 F (37.1 C) (Oral)   Resp 16   Ht 5' 7 (1.702 m)   Wt 67.6 kg   SpO2 100%   BMI 23.34 kg/m   Physical Exam Vitals and nursing note reviewed.  Constitutional:      General: She is not in acute distress.    Appearance: Normal appearance. She is ill-appearing. She is not diaphoretic.  HENT:  Head: Normocephalic and atraumatic.  Eyes:     General: No scleral icterus.       Right eye: No discharge.        Left eye: No discharge.     Extraocular Movements: Extraocular movements intact.     Conjunctiva/sclera: Conjunctivae normal.  Cardiovascular:     Rate and Rhythm: Regular rhythm. Tachycardia present.     Pulses: Normal pulses.     Heart sounds: Normal heart sounds. No murmur heard.    No friction rub. No gallop.  Pulmonary:     Effort: Pulmonary effort is  normal. Tachypnea present. No respiratory distress.     Breath sounds: No stridor. No wheezing, rhonchi or rales.  Chest:     Chest wall: No tenderness.  Abdominal:     General: Abdomen is flat. There is no distension.     Palpations: Abdomen is soft.     Tenderness: There is generalized abdominal tenderness (Generalized lower abdominal tenderness worse on right than left.) and tenderness in the right lower quadrant, suprapubic area and left lower quadrant. There is no guarding or rebound. Positive signs include McBurney's sign.  Musculoskeletal:        General: No swelling, deformity or signs of injury.     Cervical back: Normal range of motion. No rigidity.     Right lower leg: No edema.     Left lower leg: No edema.  Skin:    General: Skin is warm and dry.     Findings: Lesion (Notably has lesion to right lower extremity with picture noted in chart) present. No bruising or erythema.  Neurological:     Mental Status: She is alert and oriented to person, place, and time. Mental status is at baseline.     Comments: At baseline with right side deficits.  Psychiatric:        Mood and Affect: Mood normal.     (all labs ordered are listed, but only abnormal results are displayed) Labs Reviewed  COMPREHENSIVE METABOLIC PANEL WITH GFR - Abnormal; Notable for the following components:      Result Value   CO2 16 (*)    Glucose, Bld 305 (*)    BUN 35 (*)    Creatinine, Ser 1.54 (*)    GFR, Estimated 34 (*)    Anion gap 18 (*)    All other components within normal limits  CBC WITH DIFFERENTIAL/PLATELET - Abnormal; Notable for the following components:   WBC 23.8 (*)    RBC 5.18 (*)    Neutro Abs 21.6 (*)    Abs Immature Granulocytes 0.25 (*)    All other components within normal limits  BRAIN NATRIURETIC PEPTIDE - Abnormal; Notable for the following components:   B Natriuretic Peptide 517.4 (*)    All other components within normal limits  LACTIC ACID, PLASMA - Abnormal; Notable for  the following components:   Lactic Acid, Venous >9.0 (*)    All other components within normal limits  URINALYSIS, W/ REFLEX TO CULTURE (INFECTION SUSPECTED) - Abnormal; Notable for the following components:   APPearance CLOUDY (*)    Glucose, UA 150 (*)    Hgb urine dipstick LARGE (*)    Protein, ur 100 (*)    Leukocytes,Ua LARGE (*)    Bacteria, UA FEW (*)    Non Squamous Epithelial 0-5 (*)    All other components within normal limits  I-STAT CG4 LACTIC ACID, ED - Abnormal; Notable for the following components:   Lactic Acid,  Venous 11.0 (*)    All other components within normal limits  I-STAT CG4 LACTIC ACID, ED - Abnormal; Notable for the following components:   Lactic Acid, Venous 12.0 (*)    All other components within normal limits  TROPONIN I (HIGH SENSITIVITY) - Abnormal; Notable for the following components:   Troponin I (High Sensitivity) 96 (*)    All other components within normal limits  TROPONIN I (HIGH SENSITIVITY) - Abnormal; Notable for the following components:   Troponin I (High Sensitivity) 91 (*)    All other components within normal limits  CULTURE, BLOOD (ROUTINE X 2)  CULTURE, BLOOD (ROUTINE X 2)  RESP PANEL BY RT-PCR (RSV, FLU A&B, COVID)  RVPGX2  URINE CULTURE  PROTIME-INR  LACTIC ACID, PLASMA  BASIC METABOLIC PANEL WITH GFR  LACTIC ACID, PLASMA  BLOOD GAS, VENOUS  LACTIC ACID, PLASMA    EKG: EKG Interpretation Date/Time:  Sunday April 29 2024 16:13:18 EDT Ventricular Rate:  111 PR Interval:  147 QRS Duration:  96 QT Interval:  348 QTC Calculation: 473 R Axis:   -53  Text Interpretation: Sinus tachycardia Inferior infarct, old Confirmed by Simon Rea [phone]) on 04/29/2024 4:17:19 PM  Radiology: CT Chest W Contrast Result Date: 04/29/2024 EXAM: CT CHEST WITH CONTRAST 04/29/2024 08:51:46 PM TECHNIQUE: CT of the chest was performed with the administration of 75 mL of iohexol (OMNIPAQUE) 350 MG/ML injection. Multiplanar reformatted images  are provided for review. Automated exposure control, iterative reconstruction, and/or weight based adjustment of the mA/kV was utilized to reduce the radiation dose to as low as reasonably achievable. COMPARISON: 12/31/2007 CLINICAL HISTORY: FINDINGS: MEDIASTINUM: Mild cardiomegaly. Three-vessel coronary artery calcifications. Thoracic aortic atherosclerosis. Right pulmonary artery enlargement, suggestive of pulmonary hypertension. The central airways are clear. LYMPH NODES: No mediastinal, hilar or axillary lymphadenopathy. LUNGS AND PLEURA: Small left pleural effusion. Patchy bilateral lower lobe opacities, left greater than right, suggesting atelectasis. Mild patchy opacities in the posterior upper lobes and more likely atelectasis. Possible 8 mm subpleural nodular opacity at the left lung apex (image 27). No pneumothorax. SOFT TISSUES/BONES: No acute abnormality of the bones or soft tissues. UPPER ABDOMEN: Limited images of the upper abdomen demonstrates no acute abnormality. IMPRESSION: 1. Mild cardiomegaly. No frank interstitial edema. 2. Small left pleural effusion. 3. Equivocal 8 mm subpleural nodular opacity at the left lung apex, possibly infectious/inflammatory. Consider follow-up CT chest in 3 months to assess for persistence. 4. Suspected pulmonary arterial hypertension. Electronically signed by: Pinkie Pebbles MD 04/29/2024 09:24 PM EDT RP Workstation: [id number]   CT Angio Abd/Pel W and/or Wo Contrast Result Date: 04/29/2024 EXAM: CTA ABDOMEN AND PELVIS WITH CONTRAST 04/29/2024 08:51:46 PM TECHNIQUE: CTA images of the abdomen and pelvis with intravenous contrast. Three-dimensional MIP/volume rendered formations were performed. Automated exposure control, iterative reconstruction, and/or weight based adjustment of the mA/kV was utilized to reduce the radiation dose to as low as reasonably achievable. COMPARISON: 11/22/2017 CLINICAL HISTORY: FINDINGS: VASCULATURE: AORTA: Atherosclerotic  calcifications of the aorta, although patent. No acute finding. No abdominal aortic aneurysm. No dissection. CELIAC TRUNK: No acute finding. No occlusion or significant stenosis. SUPERIOR MESENTERIC ARTERY: Atherosclerotic calcifications of the origin of the SMA, patent. No acute finding. No occlusion or significant stenosis. RENAL ARTERIES: Atherosclerotic calcifications of the origin of the bilateral renal arteries, patent. No acute finding. No occlusion or significant stenosis. ILIAC ARTERIES: Atherosclerotic calcifications of the bilateral iliac arteries, patent. No acute finding. No occlusion or significant stenosis. LIVER: The liver is unremarkable. GALLBLADDER AND BILE DUCTS: Layering  gallstones, without associated inflammatory changes. No biliary ductal dilatation. SPLEEN: The spleen is unremarkable. PANCREAS: The pancreas is unremarkable. ADRENAL GLANDS: Bilateral adrenal glands demonstrate no acute abnormality. KIDNEYS, URETERS AND BLADDER: Subcentimeter bilateral upper pole renal cysts, benign. No follow-up is recommended. Suspected 6 mm nonobstructing interpolar left renal calculus (image 79). Mild bilateral hydroureteronephrosis, new from remote prior, but possibly reflecting neurogenic bladder / chronic bladder outlet obstruction. No stones in the ureters. No perinephric or periureteral stranding. GI AND BOWEL: Moderate hiatal hernia. Mild rectal fecal impaction, without inflammatory changes to suggest stercoral colitis. No secondary wall thickening or inflammatory changes involving bowel to suggest ischemia. Stomach and duodenal sweep demonstrate no acute abnormality. There is no bowel obstruction. No abnormal bowel wall thickening or distension. REPRODUCTIVE: Status post hysterectomy. PERITONEUM AND RETRPERITONEUM: No ascites or free air. LUNG BASE: No acute abnormality. LYMPH NODES: No lymphadenopathy. BONES AND SOFT TISSUES: Status post ORIF of the right hip. Small fat-containing bilateral inguinal  hernias. No acute abnormality of the bones. No acute soft tissue abnormality. IMPRESSION: 1. Mesenteric vessels are patent.  No findings to suggest mesenteric ischemia. 2. Mild diffuse irregular bladder wall thickening with perivesical stranding, favoring cystitis. Consider cystoscopy for further evaluation, as clinically warranted. 3. Mild bilateral hydroureteronephrosis, possibly reflecting neurogenic bladder or chronic bladder outlet obstruction. 4. 6 mm nonobstructing right renal calculus. Layering tiny bladder calculi measuring up to 2 mm, poorly visualized. Electronically signed by: Pinkie Pebbles MD 04/29/2024 09:07 PM EDT RP Workstation: [id number]   DG Tibia/Fibula Right Result Date: 04/29/2024 CLINICAL DATA:  Leg wound, concern for osteomyelitis. EXAM: RIGHT TIBIA AND FIBULA - 2 VIEW COMPARISON:  10/13/2022. FINDINGS: There is diffusely decreased mineralization of the bones. Fixation hardware is present in the distal femur with old healed fracture deformity. There is bony deformity of the medial tibial plateau, compatible with known old fracture. No periosteal elevation or bony erosions. Vascular calcifications are noted. There is moderate calcaneal spurring. Soft tissue swelling is present at the right lower extremity laterally. IMPRESSION: No radiographic evidence of osteomyelitis Electronically Signed   By: Leita Birmingham M.D.   On: 04/29/2024 17:48   DG Chest Port 1 View Result Date: 04/29/2024 EXAM: 1 VIEW(S) XRAY OF THE CHEST 04/29/2024 04:13:00 PM COMPARISON: 01/19/2024 CLINICAL HISTORY: Suspected sepsis FINDINGS: LUNGS AND PLEURA: Low lung volumes. Streaky opacities in left lung base. No pulmonary edema. No pleural effusion. No pneumothorax. HEART AND MEDIASTINUM: Aortic atherosclerosis. BONES AND SOFT TISSUES: No acute osseous abnormality. Small hiatal hernia. IMPRESSION: 1. Low lung volumes with streaky opacities in the left lung base. Electronically signed by: Norleen Boxer MD 04/29/2024  04:52 PM EDT RP Workstation: [id number]    .Critical Care  Performed by: Beola Terrall RAMAN, PA-C Authorized by: Beola Terrall RAMAN, PA-C   Critical care provider statement:    Critical care time (minutes):  70   Critical care was necessary to treat or prevent imminent or life-threatening deterioration of the following conditions:  Sepsis   Critical care was time spent personally by me on the following activities:  Development of treatment plan with patient or surrogate, discussions with consultants, evaluation of patient's response to treatment, examination of patient, ordering and review of laboratory studies, ordering and review of radiographic studies, ordering and performing treatments and interventions, pulse oximetry, re-evaluation of patient's condition, review of old charts and obtaining history from patient or surrogate   I assumed direction of critical care for this patient from another provider in my specialty: yes     Care  discussed with: admitting provider      Medications Ordered in the ED  lactated ringers  infusion ( Intravenous New Bag/Given 04/29/24 2000)  piperacillin -tazobactam (ZOSYN ) IVPB 3.375 g (has no administration in time range)  lactated ringers  bolus 500 mL (0 mLs Intravenous Stopped 04/29/24 2248)  lactated ringers  bolus 1,000 mL (0 mLs Intravenous Stopped 04/29/24 1834)    And  lactated ringers  bolus 1,000 mL (0 mLs Intravenous Stopped 04/29/24 1947)  piperacillin -tazobactam (ZOSYN ) IVPB 3.375 g (0 g Intravenous Stopped 04/29/24 1834)  vancomycin (VANCOREADY) IVPB 1250 mg/250 mL (0 mg Intravenous Stopped 04/29/24 2248)  iohexol (OMNIPAQUE) 350 MG/ML injection 75 mL (75 mLs Intravenous Contrast Given 04/29/24 2053)    Clinical Course as of 04/30/24 0012  Sun Apr 29, 2024  1648 Started last night. Vomited throughout the night. Provided stool softener. Bowel movement this am, normal. Taking eliquis  with no missed doses.  [CB]  1955 Previous nursing staff was unable  to obtain IV.  New nursing staff was able to obtain IV, which is what CT was waiting for for imaging.  CT was relayed information and does not come to get patient. [CB]  2240 Spoke with urologist, Dr. Devere, who did not believe any surgical process present this time, recommended admit to medicine, treat for UTI, enemas for stool ball, Foley catheter.  [CB]  2341 Spoke with hospitalist, Dr. Keturah who accepted patient care at this time. [CB]    Clinical Course User Index [CB] Beola Terrall RAMAN, PA-C   Medical Decision Making Amount and/or Complexity of Data Reviewed Labs: ordered. Radiology: ordered.  Risk Prescription drug management. Decision regarding hospitalization.   This patient is a 78 year old female who presents to the ED for concern of septic appearing after experiencing lower abdominal pain acute onset last night with persistent nausea and vomiting throughout today.  Notably had bowel movement this morning which was normal.  With son at bedside he reports some mild shortness of breath but otherwise  reported no other complaints.  On physical exam, patient is in no acute distress, afebrile, alert and able to answer questions appropriately.  Notably was tachycardic as well as mildly tachypneic.  Abdomen is very tender to palpation bilaterally worse on right than left.  Notably also has wound to right lower extremity which history by wound care, picture noted in chart.  Patient treated empirically with antibiotics.  As well as placed on continuous infusion.  Cultures were obtained before infusions.  On reevaluation, patient notes that her pain has improved.  But lactic acid still elevated despite antibiotics and fluids.  Ordered continued lactic acid levels as well as ordered vancomycin due to x-ray showing streaky opacities at left lower lung base.  CT scan did show signs of inflammation around bladder as well as hydronephrosis bilaterally with 6 mm nonobstructing stone noted.   Patient was straight cath urine appeared infectious.  Notably also has 8 millimeter nodular lesion suspect to be infectious versus inflammatory however with patient not experiencing any chest pain and immediately low abdominal pain nausea, suspect likely UTI as cause of patient symptoms.  X-ray of right tibia-fibula did not show any signs of osteomyelitis.  Spoke with Dr. Devere with urology who do not believe that she was surgical this time and would recommend treating for UTI and place Foley catheter.   Patient vital signs are stabilized, nor tachypnea, mildly tachycardic with heart rate of low 100s.  Will see to admit for urosepsis.  Patient care then transferred to Dr. Segars.  Differential diagnoses prior to evaluation: The emergent differential diagnosis includes, but is not limited to,  Mesenteric ischemia, diverticulitis, nephrolithiasis, constipation, bowel obstruction, IBD, ovarian torsion, PID,  . This is not an exhaustive differential.   Past Medical History / Co-morbidities / Social History: CHF, CVA with right-sided deficits currently anticoagulated Eliquis , CAD, HLD, HTN, diabetes, sepsis, CHF, stercoral ulcer of rectum, endochondroma of bone.  Additional history: Chart reviewed. Pertinent results include:   Last seen by PCP on 04/23/2024.  Notably having intermittent constipation at that time.  Admitted on 01/19/2024 for UTI.  Seen again in the emergency department on 03/28/2024 for concerns for CVA.  Lab Tests/Imaging studies: I personally interpreted labs/imaging and the pertinent results include:    CBC shows an elevated white count 23.8 CMP does note an AKI with creatinine 1.54 and decreased GFR at 34 and BUN 35.  Anion gap of 18.   Elevated troponin with flat delta likely secondary to demand ischemia UA there is no likely UTI with large leukocytes, few bacteria with greater than 50 white blood cells and large hemoglobin.  .Delta likely secondary to demand  ischemia Mildly elevated BNP of 517 Lactic acid greater than 9 on plasma Lactic acid greater than 12 on i-STAT  X-ray tibial/fibula showed no signs of acute osteomyelitis   CT chest shows mild cardiomegaly as well as small left pleural effusion and 8 mm subpleural nodule opacity suspicious for infectious versus inflammatory.  CT angio abdomen pelvis does not show any signs of mesenteric ischemia but does show mild diffuse irregular bladder wall thickening as well as mild bilateral hydroureteronephrosis as well as 6 mm nonobstructing stone  I agree with the radiologist interpretation.  Cardiac monitoring: EKG obtained and interpreted by myself and attending physician which shows: Sinus tachycardia  EKG Interpretation Date/Time:  Sunday April 29 2024 16:13:18 EDT Ventricular Rate:  111 PR Interval:  147 QRS Duration:  96 QT Interval:  348 QTC Calculation: 473 R Axis:   -53  Text Interpretation: Sinus tachycardia Inferior infarct, old Confirmed by Simon Rea [phone]) on 04/29/2024 4:17:19 PM          Medications: I ordered medication including vancomycin morning, Zosyn , LR.  I have reviewed the patients home medicines and have made adjustments as needed.  Critical Interventions: Provide septic workup as well as immediate antibiotics  Social Determinants of Health: No lives at home with family  Disposition: After consideration of the diagnostic results and the patients response to treatment, I feel that the patient would benefit from admission, the patient care transferred to Dr. Keturah.  Final diagnoses:  Sepsis secondary to UTI (HCC)  Elevated lactic acid level  Pleural effusion  Elevated troponin  Nausea and vomiting, unspecified vomiting type  Wound of right lower extremity, subsequent encounter    ED Discharge Orders     None          Beola Terrall RAMAN, PA-C 04/30/24 0013    Simon Rea SAILOR, MD 04/30/24 1122

## 2024-04-29 NOTE — Progress Notes (Signed)
 ED Pharmacy Antibiotic Sign Off An antibiotic consult was received from an ED provider for vancomycin per pharmacy dosing for sepsis. A chart review was completed to assess appropriateness.  A single dose of zosyn  placed by the ED provider.   The following one time order(s) were placed per pharmacy consult:  vancomycin 1250 mg x 1 dose  Further antibiotic and/or antibiotic pharmacy consults should be ordered by the admitting provider if indicated.   Thank you for allowing pharmacy to be a part of this patient's care.   Dorn Buttner, PharmD, BCPS 04/29/2024 7:59 PM ED Clinical Pharmacist -  [phone]

## 2024-04-29 NOTE — Sepsis Progress Note (Addendum)
 Notified provider of need to order repeat lactic acid. PA notified via secure chat.  [phone]--acknowledged via Secure Chat

## 2024-04-29 NOTE — H&P (Incomplete)
 History and Physical    [name] FMW:[id number] DOB: 11/27/1945 DOA: 04/29/2024  PCP: Norleen Lynwood ORN, MD   Patient coming from: {Blank single:19197::Home,Clinic,SNF,ALF,Group Home,Hospice,***}   Chief Complaint:  Chief Complaint  Patient presents with  . Abdominal Pain    HPI:  [name] is a 78 y.o. female with hx of ***    Review of Systems:  ROS complete and negative except as marked above   Allergies  Allergen Reactions  . Porcine (Pork) Protein-Containing Drug Products Other (See Comments)    To keep blood pressure down    Prior to Admission medications   Medication Sig Start Date End Date Taking? Authorizing Provider  acetaminophen  (TYLENOL ) 325 MG tablet Take 2 tablets (650 mg total) by mouth every 6 (six) hours as needed. 03/28/24  Yes Dean Clarity, MD  atorvastatin  (LIPITOR) 20 MG tablet Take 1 tablet (20 mg total) by mouth daily. 03/28/24  Yes Dean Clarity, MD  ELIQUIS  5 MG TABS tablet Take 1 tablet (5 mg total) by mouth every 12 (twelve) hours. 03/28/24  Yes Dean Clarity, MD  gentamicin cream (GARAMYCIN) 0.1 % Apply 1 Application topically daily. 04/04/24  Yes [provider]  levETIRAcetam  (KEPPRA ) 500 MG tablet Take 1 tablet (500 mg total) by mouth every 12 (twelve) hours. 03/28/24  Yes Dean Clarity, MD  losartan  (COZAAR ) 25 MG tablet Take 0.5 tablets (12.5 mg total) by mouth daily. 03/28/24  Yes Dean Clarity, MD  metoprolol  succinate (TOPROL -XL) 25 MG 24 hr tablet Take 1 tablet (25 mg total) by mouth daily. 03/28/24  Yes Haviland, Julie, MD  polyethylene glycol (MIRALAX  / GLYCOLAX ) 17 g packet Take 17 g by mouth daily as needed for mild constipation or moderate constipation. Use Miralax  if Milk of Magnesia is contraindicated (Example: Dialysis resident) 03/28/24  Yes Dean Clarity, MD  TRESIBA  FLEXTOUCH 100 UNIT/ML FlexTouch Pen Inject 10 Units into the skin at bedtime. 03/29/24  Yes Towana Ozell BROCKS, MD  diclofenac   Sodium (VOLTAREN ) 1 % GEL Apply 4 g topically 4 (four) times daily. Patient not taking: Reported on 04/29/2024 03/28/24   Dean Clarity, MD    Past Medical History:  Diagnosis Date  . Acute blood loss anemia   . CHF (congestive heart failure) (HCC)    EF 50-55%, grade 1 diastolic dysfunction per echo 89/7983  . 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 CVA (cerebral infarction) 04/2015   Started Plavix  04/2015  . Depression   . Diabetes (HCC) 2016   Type II. On insulin   . Enchondroma of bone 2011   left femur.   . Fracture, intertrochanteric, right femur (HCC)   . History of lower GI bleeding   . Hyperlipidemia   . Hypertension   . Patent foramen ovale 04/2015   Started on warfarin 04/2015  . Protein calorie malnutrition   . Stercoral ulcer of rectum 05/16/2015    Past Surgical History:  Procedure Laterality Date  . CARDIAC SURGERY     Cath without stent  . COLONOSCOPY N/A 05/15/2015   Procedure: COLONOSCOPY;  Surgeon: Gustav Shila GAILS, MD;  Location: [facility] ENDOSCOPY;  Service: Endoscopy;  Laterality: N/A;  . FLEXIBLE SIGMOIDOSCOPY N/A 05/15/2015   Procedure: FLEXIBLE SIGMOIDOSCOPY;  Surgeon: Gustav Shila GAILS, MD;  Location: [facility] ENDOSCOPY;  Service: Endoscopy;  Laterality: N/A;  at bedside  . INTRAMEDULLARY (IM) NAIL INTERTROCHANTERIC Right 06/12/2015   Procedure: INTRAMEDULLARY (IM) NAIL RIGHT HIP;  Surgeon: Evalene JONETTA Chancy, MD;  Location:  [facility] OR;  Service: Orthopedics;  Laterality: Right;  . TEE WITHOUT CARDIOVERSION N/A 05/05/2015   Procedure: TRANSESOPHAGEAL ECHOCARDIOGRAM (TEE);  Surgeon: Salena Negri, MD;  Location: [facility] ENDOSCOPY;  Service: Cardiovascular;  Laterality: N/A;     reports that she quit smoking about 9 years ago. Her smoking use included cigarettes. She has never used smokeless tobacco. She reports that she does not drink alcohol and does not use drugs.  Family History  Problem Relation Age of Onset   . Hypertension Mother   . Heart failure Mother   . Hyperlipidemia Mother   . Heart attack Father      Physical Exam: Vitals:   04/29/24 1547 04/29/24 1830 04/29/24 1937 04/29/24 2231  BP: 123/65 129/70  128/81  Pulse: (!) 117 99  (!) 102  Resp: (!) 26 20  16   Temp: 99.7 F (37.6 C)  98.7 F (37.1 C)   TempSrc: Oral  Oral   SpO2: 100% 100%  100%    Gen: Awake, alert, NAD ***  CV: Regular, normal S1, S2, no murmurs  Resp: Normal WOB, CTAB  Abd: Flat, normoactive, nontender MSK: Symmetric, no edema  Skin: No rashes or lesions to exposed skin  Neuro: Alert and interactive  Psych: euthymic, appropriate    Data review:   Labs reviewed, notable for:  ***  Micro:  Results for orders placed or performed during the hospital encounter of 04/29/24  Culture, blood (Routine x 2)     Status: None (Preliminary result)   Collection Time: 04/29/24  3:52 PM   Specimen: BLOOD RIGHT FOREARM  Result Value Ref Range Status   Specimen Description BLOOD RIGHT FOREARM  Final   Special Requests   Final    BOTTLES DRAWN AEROBIC AND ANAEROBIC Blood Culture adequate volume Performed at [facility], [address], [place], [place] [address]    Culture PENDING  Incomplete   Report Status PENDING  Incomplete  Resp panel by RT-PCR (RSV, Flu A&B, Covid) Anterior Nasal Swab     Status: None   Collection Time: 04/29/24  5:07 PM   Specimen: Anterior Nasal Swab  Result Value Ref Range Status   SARS Coronavirus 2 by RT PCR NEGATIVE NEGATIVE Final   Influenza A by PCR NEGATIVE NEGATIVE Final   Influenza B by PCR NEGATIVE NEGATIVE Final    Comment: (NOTE) The Xpert Xpress SARS-CoV-2/FLU/RSV plus assay is intended as an aid in the diagnosis of influenza from Nasopharyngeal swab specimens and should not be used as a sole basis for treatment. Nasal washings and aspirates are unacceptable for Xpert Xpress SARS-CoV-2/FLU/RSV testing.  Fact Sheet for  Patients: bloggercourse.com  Fact Sheet for Healthcare Providers: seriousbroker.it  This test is not yet approved or cleared by the [place]  FDA and has been authorized for detection and/or diagnosis of SARS-CoV-2 by FDA under an Emergency Use Authorization (EUA). This EUA will remain in effect (meaning this test can be used) for the duration of the COVID-19 declaration under Section 564(b)(1) of the Act, 21 U.S.C. section 360bbb-3(b)(1), unless the authorization is terminated or revoked.     Resp Syncytial Virus by PCR NEGATIVE NEGATIVE Final    Comment: (NOTE) Fact Sheet for Patients: bloggercourse.com  Fact Sheet for Healthcare Providers: seriousbroker.it  This test is not yet approved or cleared by the [place]  FDA and has been authorized for detection and/or diagnosis of SARS-CoV-2 by FDA under an Emergency Use Authorization (EUA). This EUA will remain in effect (meaning this test can be  used) for the duration of the COVID-19 declaration under Section 564(b)(1) of the Act, 21 U.S.C. section 360bbb-3(b)(1), unless the authorization is terminated or revoked.  Performed at [facility], [address] [place], [place] [address]   Culture, blood (Routine x 2)     Status: None (Preliminary result)   Collection Time: 04/29/24  5:23 PM   Specimen: BLOOD LEFT ARM  Result Value Ref Range Status   Specimen Description BLOOD LEFT ARM  Final   Special Requests   Final    AEROBIC BOTTLE ONLY Blood Culture results may not be optimal due to an inadequate volume of blood received in culture bottles Performed at [facility], [address] [place], [place] [address]    Culture PENDING  Incomplete   Report Status PENDING  Incomplete    Imaging reviewed:  CT Chest W Contrast Result Date: 04/29/2024 EXAM: CT CHEST WITH CONTRAST 04/29/2024 08:51:46 PM TECHNIQUE:  CT of the chest was performed with the administration of 75 mL of iohexol (OMNIPAQUE) 350 MG/ML injection. Multiplanar reformatted images are provided for review. Automated exposure control, iterative reconstruction, and/or weight based adjustment of the mA/kV was utilized to reduce the radiation dose to as low as reasonably achievable. COMPARISON: 12/31/2007 CLINICAL HISTORY: FINDINGS: MEDIASTINUM: Mild cardiomegaly. Three-vessel coronary artery calcifications. Thoracic aortic atherosclerosis. Right pulmonary artery enlargement, suggestive of pulmonary hypertension. The central airways are clear. LYMPH NODES: No mediastinal, hilar or axillary lymphadenopathy. LUNGS AND PLEURA: Small left pleural effusion. Patchy bilateral lower lobe opacities, left greater than right, suggesting atelectasis. Mild patchy opacities in the posterior upper lobes and more likely atelectasis. Possible 8 mm subpleural nodular opacity at the left lung apex (image 27). No pneumothorax. SOFT TISSUES/BONES: No acute abnormality of the bones or soft tissues. UPPER ABDOMEN: Limited images of the upper abdomen demonstrates no acute abnormality. IMPRESSION: 1. Mild cardiomegaly. No frank interstitial edema. 2. Small left pleural effusion. 3. Equivocal 8 mm subpleural nodular opacity at the left lung apex, possibly infectious/inflammatory. Consider follow-up CT chest in 3 months to assess for persistence. 4. Suspected pulmonary arterial hypertension. Electronically signed by: Pinkie Pebbles MD 04/29/2024 09:24 PM EDT RP Workstation: [id number]   CT Angio Abd/Pel W and/or Wo Contrast Result Date: 04/29/2024 EXAM: CTA ABDOMEN AND PELVIS WITH CONTRAST 04/29/2024 08:51:46 PM TECHNIQUE: CTA images of the abdomen and pelvis with intravenous contrast. Three-dimensional MIP/volume rendered formations were performed. Automated exposure control, iterative reconstruction, and/or weight based adjustment of the mA/kV was utilized to reduce the radiation  dose to as low as reasonably achievable. COMPARISON: 11/22/2017 CLINICAL HISTORY: FINDINGS: VASCULATURE: AORTA: Atherosclerotic calcifications of the aorta, although patent. No acute finding. No abdominal aortic aneurysm. No dissection. CELIAC TRUNK: No acute finding. No occlusion or significant stenosis. SUPERIOR MESENTERIC ARTERY: Atherosclerotic calcifications of the origin of the SMA, patent. No acute finding. No occlusion or significant stenosis. RENAL ARTERIES: Atherosclerotic calcifications of the origin of the bilateral renal arteries, patent. No acute finding. No occlusion or significant stenosis. ILIAC ARTERIES: Atherosclerotic calcifications of the bilateral iliac arteries, patent. No acute finding. No occlusion or significant stenosis. LIVER: The liver is unremarkable. GALLBLADDER AND BILE DUCTS: Layering gallstones, without associated inflammatory changes. No biliary ductal dilatation. SPLEEN: The spleen is unremarkable. PANCREAS: The pancreas is unremarkable. ADRENAL GLANDS: Bilateral adrenal glands demonstrate no acute abnormality. KIDNEYS, URETERS AND BLADDER: Subcentimeter bilateral upper pole renal cysts, benign. No follow-up is recommended. Suspected 6 mm nonobstructing interpolar left renal calculus (image 79). Mild bilateral hydroureteronephrosis, new from remote  prior, but possibly reflecting neurogenic bladder / chronic bladder outlet obstruction. No stones in the ureters. No perinephric or periureteral stranding. GI AND BOWEL: Moderate hiatal hernia. Mild rectal fecal impaction, without inflammatory changes to suggest stercoral colitis. No secondary wall thickening or inflammatory changes involving bowel to suggest ischemia. Stomach and duodenal sweep demonstrate no acute abnormality. There is no bowel obstruction. No abnormal bowel wall thickening or distension. REPRODUCTIVE: Status post hysterectomy. PERITONEUM AND RETRPERITONEUM: No ascites or free air. LUNG BASE: No acute abnormality.  LYMPH NODES: No lymphadenopathy. BONES AND SOFT TISSUES: Status post ORIF of the right hip. Small fat-containing bilateral inguinal hernias. No acute abnormality of the bones. No acute soft tissue abnormality. IMPRESSION: 1. Mesenteric vessels are patent.  No findings to suggest mesenteric ischemia. 2. Mild diffuse irregular bladder wall thickening with perivesical stranding, favoring cystitis. Consider cystoscopy for further evaluation, as clinically warranted. 3. Mild bilateral hydroureteronephrosis, possibly reflecting neurogenic bladder or chronic bladder outlet obstruction. 4. 6 mm nonobstructing right renal calculus. Layering tiny bladder calculi measuring up to 2 mm, poorly visualized. Electronically signed by: Pinkie Pebbles MD 04/29/2024 09:07 PM EDT RP Workstation: [id number]   DG Tibia/Fibula Right Result Date: 04/29/2024 CLINICAL DATA:  Leg wound, concern for osteomyelitis. EXAM: RIGHT TIBIA AND FIBULA - 2 VIEW COMPARISON:  10/13/2022. FINDINGS: There is diffusely decreased mineralization of the bones. Fixation hardware is present in the distal femur with old healed fracture deformity. There is bony deformity of the medial tibial plateau, compatible with known old fracture. No periosteal elevation or bony erosions. Vascular calcifications are noted. There is moderate calcaneal spurring. Soft tissue swelling is present at the right lower extremity laterally. IMPRESSION: No radiographic evidence of osteomyelitis Electronically Signed   By: Leita Birmingham M.D.   On: 04/29/2024 17:48   DG Chest Port 1 View Result Date: 04/29/2024 EXAM: 1 VIEW(S) XRAY OF THE CHEST 04/29/2024 04:13:00 PM COMPARISON: 01/19/2024 CLINICAL HISTORY: Suspected sepsis FINDINGS: LUNGS AND PLEURA: Low lung volumes. Streaky opacities in left lung base. No pulmonary edema. No pleural effusion. No pneumothorax. HEART AND MEDIASTINUM: Aortic atherosclerosis. BONES AND SOFT TISSUES: No acute osseous abnormality. Small hiatal  hernia. IMPRESSION: 1. Low lung volumes with streaky opacities in the left lung base. Electronically signed by: Norleen Boxer MD 04/29/2024 04:52 PM EDT RP Workstation: [id number]    EKG: ***   ED Course: ***    Assessment/Plan:  78 y.o. female with hx ***   Sepsis suspect secondary to complicated urinary tract infection ***Micro review most recently has had pansensitive E faecalis prior results include a pansensitive Pseudomonas, amp resistant Kleb pneumonia.   Incidental findings  Mild cardiomegaly.  Equivocal 8 mm subpleural nodular opacity at the left lung apex, possibly infectious/inflammatory. Consider follow-up CT chest in 3 months to assess for persistence. Suspected pulmonary arterial hypertension.  There is no height or weight on file to calculate BMI. ***   DVT prophylaxis:  {Blank single:19197::Lovenox ,SQ Heparin ,IV heparin  gtts,Xarelto,Eliquis ,Coumadin ,SCDs,***} Code Status:  {Blank single:19197::Full Code,DNR with Intubation,DNR/DNI(Do NOT Intubate),Comfort Care,***} Diet:  Diet Orders (From admission, onward)     Start     Ordered   04/29/24 1707  Diet NPO time specified  (Septic presentation on arrival (screening labs, nursing and treatment orders for obvious sepsis))  Diet effective now        04/29/24 1707           Family Communication:  ***  Consults:  ***  Admission status:   {Blank single:19197::Observation,Inpatient}, {Blank single:19197::Med-Surg,Telemetry bed,Step Down Unit}  Severity  of Illness: {Observation/Inpatient:21159}   Dorn Dawson, MD [facility]  How to contact the [facility] Attending or Consulting provider 7A - 7P or covering provider during after hours 7P -7A, for this patient.  Check the care team in [facility] and look for a) attending/consulting [facility] provider listed and b) the [facility] team listed Log into [email or web address] and use [place]'s universal password to access. If you do not have the  password, please contact the hospital operator. Locate the [facility] provider you are looking for under [facility] and page to a number that you can be directly reached. If you still have difficulty reaching the provider, please page the [facility] (Director on Call) for the Hospitalists listed on amion for assistance.  04/29/2024, 11:41 PM

## 2024-04-30 ENCOUNTER — Encounter (HOSPITAL_COMMUNITY): Payer: Self-pay | Admitting: Critical Care Medicine

## 2024-04-30 DIAGNOSIS — R112 Nausea with vomiting, unspecified: Secondary | ICD-10-CM

## 2024-04-30 DIAGNOSIS — R6520 Severe sepsis without septic shock: Secondary | ICD-10-CM | POA: Diagnosis not present

## 2024-04-30 DIAGNOSIS — R6521 Severe sepsis with septic shock: Secondary | ICD-10-CM

## 2024-04-30 DIAGNOSIS — L8990 Pressure ulcer of unspecified site, unspecified stage: Secondary | ICD-10-CM | POA: Insufficient documentation

## 2024-04-30 DIAGNOSIS — E872 Acidosis, unspecified: Secondary | ICD-10-CM

## 2024-04-30 DIAGNOSIS — A419 Sepsis, unspecified organism: Secondary | ICD-10-CM

## 2024-04-30 DIAGNOSIS — S81801A Unspecified open wound, right lower leg, initial encounter: Secondary | ICD-10-CM

## 2024-04-30 DIAGNOSIS — R7989 Other specified abnormal findings of blood chemistry: Secondary | ICD-10-CM

## 2024-04-30 DIAGNOSIS — D696 Thrombocytopenia, unspecified: Secondary | ICD-10-CM

## 2024-04-30 DIAGNOSIS — A4152 Sepsis due to Pseudomonas: Secondary | ICD-10-CM | POA: Diagnosis not present

## 2024-04-30 DIAGNOSIS — E1165 Type 2 diabetes mellitus with hyperglycemia: Secondary | ICD-10-CM

## 2024-04-30 DIAGNOSIS — J90 Pleural effusion, not elsewhere classified: Secondary | ICD-10-CM

## 2024-04-30 DIAGNOSIS — D649 Anemia, unspecified: Secondary | ICD-10-CM

## 2024-04-30 LAB — PHOSPHORUS
Phosphorus: 2.4 mg/dL — ABNORMAL LOW (ref 2.5–4.6)
Phosphorus: 2 mg/dL — ABNORMAL LOW (ref 2.5–4.6)

## 2024-04-30 LAB — BLOOD CULTURE ID PANEL (REFLEXED) - BCID2

## 2024-04-30 LAB — BASIC METABOLIC PANEL WITH GFR
Anion gap: 12 (ref 5–15)
Anion gap: 12 (ref 5–15)
Anion gap: 12 (ref 5–15)
BUN: 30 mg/dL — ABNORMAL HIGH (ref 8–23)
BUN: 30 mg/dL — ABNORMAL HIGH (ref 8–23)
BUN: 23 mg/dL (ref 8–23)
CO2: 22 mmol/L (ref 22–32)
CO2: 22 mmol/L (ref 22–32)
CO2: 20 mmol/L — ABNORMAL LOW (ref 22–32)
Calcium: 8.8 mg/dL — ABNORMAL LOW (ref 8.9–10.3)
Calcium: 8.9 mg/dL (ref 8.9–10.3)
Calcium: 7.5 mg/dL — ABNORMAL LOW (ref 8.9–10.3)
Chloride: 103 mmol/L (ref 98–111)
Chloride: 105 mmol/L (ref 98–111)
Chloride: 106 mmol/L (ref 98–111)
Creatinine, Ser: 1.2 mg/dL — ABNORMAL HIGH (ref 0.44–1.00)
Creatinine, Ser: 1.21 mg/dL — ABNORMAL HIGH (ref 0.44–1.00)
Creatinine, Ser: 0.9 mg/dL (ref 0.44–1.00)
GFR, Estimated: 46 mL/min — ABNORMAL LOW (ref >60)
GFR, Estimated: 46 mL/min — ABNORMAL LOW (ref >60)
GFR, Estimated: >60 mL/min (ref >60)
Glucose, Bld: 216 mg/dL — ABNORMAL HIGH (ref 70–99)
Glucose, Bld: 211 mg/dL — ABNORMAL HIGH (ref 70–99)
Glucose, Bld: 153 mg/dL — ABNORMAL HIGH (ref 70–99)
Potassium: 4.4 mmol/L (ref 3.5–5.1)
Potassium: 4.5 mmol/L (ref 3.5–5.1)
Potassium: 4 mmol/L (ref 3.5–5.1)
Sodium: 137 mmol/L (ref 135–145)
Sodium: 139 mmol/L (ref 135–145)
Sodium: 138 mmol/L (ref 135–145)

## 2024-04-30 LAB — I-STAT CG4 LACTIC ACID, ED
Lactic Acid, Venous: 3 mmol/L (ref 0.5–1.9)
Lactic Acid, Venous: 3.4 mmol/L (ref 0.5–1.9)

## 2024-04-30 LAB — CBG MONITORING, ED
Glucose-Capillary: 210 mg/dL — ABNORMAL HIGH (ref 70–99)
Glucose-Capillary: 149 mg/dL — ABNORMAL HIGH (ref 70–99)
Glucose-Capillary: 190 mg/dL — ABNORMAL HIGH (ref 70–99)
Glucose-Capillary: 131 mg/dL — ABNORMAL HIGH (ref 70–99)

## 2024-04-30 LAB — BLOOD GAS, VENOUS
Acid-base deficit: 11.9 mmol/L — ABNORMAL HIGH (ref 0.0–2.0)
Bicarbonate: 12.6 mmol/L — ABNORMAL LOW (ref 20.0–28.0)
O2 Saturation: 62.9 %
Patient temperature: 37
pCO2, Ven: 25 mmHg — ABNORMAL LOW (ref 44–60)
pH, Ven: 7.31 (ref 7.25–7.43)
pO2, Ven: 36 mmHg (ref 32–45)

## 2024-04-30 LAB — GLUCOSE, CAPILLARY
Glucose-Capillary: 118 mg/dL — ABNORMAL HIGH (ref 70–99)
Glucose-Capillary: 122 mg/dL — ABNORMAL HIGH (ref 70–99)
Glucose-Capillary: 146 mg/dL — ABNORMAL HIGH (ref 70–99)

## 2024-04-30 LAB — CBC
HCT: 36.6 % (ref 36.0–46.0)
Hemoglobin: 11.6 g/dL — ABNORMAL LOW (ref 12.0–15.0)
MCH: 27.5 pg (ref 26.0–34.0)
MCHC: 31.7 g/dL (ref 30.0–36.0)
MCV: 86.7 fL (ref 80.0–100.0)
Platelets: 135 K/uL — ABNORMAL LOW (ref 150–400)
RBC: 4.22 MIL/uL (ref 3.87–5.11)
RDW: 13.3 % (ref 11.5–15.5)
WBC: 23.3 K/uL — ABNORMAL HIGH (ref 4.0–10.5)
nRBC: 0 % (ref 0.0–0.2)

## 2024-04-30 LAB — MAGNESIUM
Magnesium: 1.5 mg/dL — ABNORMAL LOW (ref 1.7–2.4)
Magnesium: 1.5 mg/dL — ABNORMAL LOW (ref 1.7–2.4)

## 2024-04-30 LAB — MRSA NEXT GEN BY PCR, NASAL: MRSA by PCR Next Gen: NOT DETECTED

## 2024-04-30 LAB — LACTIC ACID, PLASMA
Lactic Acid, Venous: 5.4 mmol/L (ref 0.5–1.9)
Lactic Acid, Venous: 4.5 mmol/L (ref 0.5–1.9)
Lactic Acid, Venous: 2.1 mmol/L (ref 0.5–1.9)

## 2024-04-30 MED ORDER — SODIUM CHLORIDE 0.9 % IV SOLN
250.0000 mL | INTRAVENOUS | Status: AC
  Administered 2024-05-01: 250 mL via INTRAVENOUS

## 2024-04-30 MED ORDER — LACTATED RINGERS IV BOLUS
1000.0000 mL | Freq: Once | INTRAVENOUS | Status: AC
  Administered 2024-04-30: 1000 mL via INTRAVENOUS

## 2024-04-30 MED ORDER — ACETAMINOPHEN 500 MG PO TABS
1000.0000 mg | ORAL_TABLET | Freq: Four times a day (QID) | ORAL | Status: DC | PRN
  Administered 2024-04-30 – 2024-05-04 (×3): 1000 mg via ORAL
  Filled 2024-04-30 (×3): qty 2

## 2024-04-30 MED ORDER — MAGNESIUM SULFATE 2 GM/50ML IV SOLN
2.0000 g | Freq: Once | INTRAVENOUS | Status: AC
  Administered 2024-04-30: 2 g via INTRAVENOUS
  Filled 2024-04-30: qty 50

## 2024-04-30 MED ORDER — SODIUM CHLORIDE 0.9 % IV SOLN
2.0000 g | INTRAVENOUS | Status: DC
  Administered 2024-04-30: 2 g via INTRAVENOUS
  Filled 2024-04-30: qty 20

## 2024-04-30 MED ORDER — GENTAMICIN SULFATE 0.1 % EX OINT
TOPICAL_OINTMENT | CUTANEOUS | Status: DC
  Filled 2024-04-30: qty 15

## 2024-04-30 MED ORDER — LACTATED RINGERS IV BOLUS
500.0000 mL | Freq: Once | INTRAVENOUS | Status: AC
  Administered 2024-04-30: 500 mL via INTRAVENOUS

## 2024-04-30 MED ORDER — SENNA 8.6 MG PO TABS
1.0000 | ORAL_TABLET | Freq: Every day | ORAL | Status: DC
  Administered 2024-05-01 – 2024-05-02 (×2): 8.6 mg via ORAL
  Filled 2024-04-30 (×2): qty 1

## 2024-04-30 MED ORDER — SODIUM CHLORIDE 0.9 % BOLUS PEDS: 500.0000 mL | Freq: Once | INTRAVENOUS | Status: DC

## 2024-04-30 MED ORDER — SODIUM CHLORIDE 0.9% FLUSH
3.0000 mL | Freq: Two times a day (BID) | INTRAVENOUS | Status: DC
  Administered 2024-04-30 – 2024-05-06 (×13): 3 mL via INTRAVENOUS

## 2024-04-30 MED ORDER — ACETAMINOPHEN 500 MG PO TABS
1000.0000 mg | ORAL_TABLET | Freq: Four times a day (QID) | ORAL | Status: DC | PRN
  Administered 2024-04-30: 1000 mg via ORAL
  Filled 2024-04-30: qty 2

## 2024-04-30 MED ORDER — POLYETHYLENE GLYCOL 3350 17 G PO PACK
17.0000 g | PACK | Freq: Every day | ORAL | Status: DC
  Administered 2024-04-30 – 2024-05-06 (×7): 17 g via ORAL
  Filled 2024-04-30 (×7): qty 1

## 2024-04-30 MED ORDER — MELATONIN 3 MG PO TABS
6.0000 mg | ORAL_TABLET | Freq: Every day | ORAL | Status: DC
  Administered 2024-04-30 – 2024-05-05 (×5): 6 mg via ORAL
  Filled 2024-04-30 (×5): qty 2

## 2024-04-30 MED ORDER — POLYETHYLENE GLYCOL 3350 17 G PO PACK: 17.0000 g | PACK | Freq: Every day | ORAL | Status: DC | PRN

## 2024-04-30 MED ORDER — SODIUM CHLORIDE 0.9 % IV BOLUS
500.0000 mL | Freq: Once | INTRAVENOUS | Status: AC
  Administered 2024-04-30: 500 mL via INTRAVENOUS

## 2024-04-30 MED ORDER — DOCUSATE SODIUM 100 MG PO CAPS: 100.0000 mg | ORAL_CAPSULE | Freq: Two times a day (BID) | ORAL | Status: DC | PRN

## 2024-04-30 MED ORDER — INSULIN ASPART 100 UNIT/ML IJ SOLN
0.0000 [IU] | Freq: Three times a day (TID) | INTRAMUSCULAR | Status: DC
  Administered 2024-04-30 – 2024-05-03 (×5): 1 [IU] via SUBCUTANEOUS
  Administered 2024-05-03: 2 [IU] via SUBCUTANEOUS
  Administered 2024-05-04 – 2024-05-06 (×5): 1 [IU] via SUBCUTANEOUS
  Filled 2024-04-30: qty 2

## 2024-04-30 MED ORDER — ORAL CARE MOUTH RINSE: 15.0000 mL | OROMUCOSAL | Status: DC | PRN

## 2024-04-30 MED ORDER — INSULIN ASPART 100 UNIT/ML IJ SOLN
0.0000 [IU] | Freq: Every day | INTRAMUSCULAR | Status: DC
  Administered 2024-04-30 – 2024-05-02 (×3): 2 [IU] via SUBCUTANEOUS

## 2024-04-30 MED ORDER — MELATONIN 3 MG PO TABS: 6.0000 mg | ORAL_TABLET | Freq: Every evening | ORAL | Status: DC | PRN

## 2024-04-30 MED ORDER — ATORVASTATIN CALCIUM 10 MG PO TABS
20.0000 mg | ORAL_TABLET | Freq: Every day | ORAL | Status: DC
  Administered 2024-04-30 – 2024-05-06 (×7): 20 mg via ORAL
  Filled 2024-04-30 (×7): qty 2

## 2024-04-30 MED ORDER — ONDANSETRON HCL 4 MG/2ML IJ SOLN: 4.0000 mg | Freq: Four times a day (QID) | INTRAMUSCULAR | Status: DC | PRN

## 2024-04-30 MED ORDER — APIXABAN 5 MG PO TABS
5.0000 mg | ORAL_TABLET | Freq: Two times a day (BID) | ORAL | Status: DC
  Administered 2024-04-30 – 2024-05-06 (×14): 5 mg via ORAL
  Filled 2024-04-30 (×14): qty 1

## 2024-04-30 MED ORDER — CHLORHEXIDINE GLUCONATE CLOTH 2 % EX PADS
6.0000 | MEDICATED_PAD | Freq: Every day | CUTANEOUS | Status: DC
  Administered 2024-05-01 – 2024-05-06 (×6): 6 via TOPICAL

## 2024-04-30 MED ORDER — GENTAMICIN SULFATE 0.1 % EX OINT
TOPICAL_OINTMENT | Freq: Every day | CUTANEOUS | Status: DC
  Filled 2024-04-30: qty 15

## 2024-04-30 MED ORDER — ALBUTEROL SULFATE (2.5 MG/3ML) 0.083% IN NEBU: 2.5000 mg | INHALATION_SOLUTION | RESPIRATORY_TRACT | Status: DC | PRN

## 2024-04-30 MED ORDER — NOREPINEPHRINE 4 MG/250ML-% IV SOLN
0.0000 ug/min | INTRAVENOUS | Status: DC
  Administered 2024-04-30: 2 ug/min via INTRAVENOUS
  Filled 2024-04-30: qty 250

## 2024-04-30 MED ORDER — INSULIN GLARGINE-YFGN 100 UNIT/ML ~~LOC~~ SOLN
10.0000 [IU] | Freq: Every day | SUBCUTANEOUS | Status: DC
  Administered 2024-04-30 (×2): 10 [IU] via SUBCUTANEOUS
  Filled 2024-04-30 (×3): qty 0.1

## 2024-04-30 MED ORDER — VANCOMYCIN VARIABLE DOSE PER UNSTABLE RENAL FUNCTION (PHARMACIST DOSING): Status: DC

## 2024-04-30 MED ORDER — LEVETIRACETAM 500 MG PO TABS
500.0000 mg | ORAL_TABLET | Freq: Two times a day (BID) | ORAL | Status: DC
  Administered 2024-04-30 – 2024-05-06 (×14): 500 mg via ORAL
  Filled 2024-04-30 (×14): qty 1

## 2024-04-30 MED ORDER — METOPROLOL SUCCINATE ER 25 MG PO TB24
25.0000 mg | ORAL_TABLET | Freq: Every day | ORAL | Status: DC
  Administered 2024-04-30: 25 mg via ORAL
  Filled 2024-04-30: qty 1

## 2024-04-30 NOTE — ED Notes (Signed)
 Lactic results to [facility] t.rn by at

## 2024-04-30 NOTE — ED Notes (Signed)
 MD messaged. Pt's BP readings are trending down. BP cuff adjusted and Bps have been confirmed.

## 2024-04-30 NOTE — Consult Note (Signed)
 WOC Nurse Consult Note: patient is followed by wound care center for R lower leg Stage 3 Pressure Injury; last seen 04/18/2024 using Gentamicin ointment and Fibercol (a collagen dressing); Collagen is an advanced wound therapy and not available on formulary at [facility]  Reason for Consult: R lower leg wound  Wound type: Stage 3 Pressure Injury R lower leg  Pressure Injury POA: Yes Measurement: see nursing flowsheet; per [facility] note 4.6 cm x 1 cm x 0.1 cm  Wound bed:50% pink 50% dark purple  Drainage (amount, consistency, odor) see nursing flowsheet  Periwound: patient with lymphedema  Dressing procedure/placement/frequency: Cleanse R lower leg wound with NS, apply Gentamicin ointment to wound bed then cover with silver hydrofiber (Aquacel [employer] Gerlean #[id number]) cut to fit wound bed daily, cover with ABD pad and secure with Kerlix roll gauze beginning right above toes and ending above wound.  Apply Ace bandage wrapped in same fashion as Kerlix for light compression.   POC discussed with bedside nurse. WOC team will not follow. Re-consult if further needs arise.   Thank you,    Powell Bar MSN, RN-BC, [employer]

## 2024-04-30 NOTE — Consult Note (Signed)
 NAME:  [name], MRN:  [id number], DOB:  05/06/1946, LOS: 1 ADMISSION DATE:  04/29/2024 CONSULTATION DATE:  04/30/2024 REFERRING MD:  Leotis - TRH, CHIEF COMPLAINT:  Hypotension, sepsis   History of Present Illness:  78 year old woman who presented to [facility] ED 10/26 for abdominal pain. PMHx significant for HTN, HLD, CVA (residual R-sided deficits, on Eliquis ), CAD, HFpEF (Echo 04/2020 55-60%, G1DD), PFO, T2DM, ABLA with history of GIB, stercoral rectal ulcer, chronic RLE wound (followed by wound care).  Presented to [facility] ED with abdominal pain (R > L), vomiting and PO intolerance. On ED arrival, patient was afebrile with HR 102, BP 128/81, RR 16, SpO2 100%. Labs were notable for WBC 23.8, Hgb 14.2, Plt 190. Na 136, K 4.5, CO2 16, Cr 1.54 (baseline 0.6-0.7), LFTs WNL. LA 11 > 12 > 9 > 5.4 > 4.5 > 3.0. UA +glucose, large Hgb, scant protein, large leuks. BCx BCID +proteus. CXR with low lung volumes/streaky opacities in L lung base, CT Chest with mild cardiomegaly, small L pleural effusion/no edema, 8mm subpleural nodular opacity at L lung apex., ?PAH. CTA A/P with patent vasculature, no e/o mesenteric ischemia, c/f cystitis and mild bilateral hydroureteronephrosis, ?neurogenic bladder versus chronic bladder outlet obstruction, 6mm nonobstructing R renal calculus. Broad spectrum antibiotics started (Vanc/Zosyn ), narrowed to ceftriaxone  and fluid resuscitated with 2.5L IV fluids. Urology recommended no surgical intervention, Foley placement and continuation of antibiotics for nonobstructing renal calculus.   Initially admitted by TRH, however on 10/27 patient was noted to be hypotensive with continued elevated lactic acid despite fluid resuscitation.  PCCM consulted for ICU evaluation.  Pertinent Medical History:   Past Medical History:  Diagnosis Date   Acute blood loss anemia    CHF (congestive heart failure) (HCC)    EF 50-55%, grade 1 diastolic dysfunction per echo 89/7983   Coronary artery  disease involving native artery of transplanted heart without angina pectoris    CVA (cerebral infarction) 04/2015   Started Plavix  04/2015   Depression    Diabetes (HCC) 2016   Type II. On insulin    Enchondroma of bone 2011   left femur.    Fracture, intertrochanteric, right femur (HCC)    History of lower GI bleeding    Hyperlipidemia    Hypertension    Patent foramen ovale 04/2015   Started on warfarin 04/2015   Protein calorie malnutrition    Stercoral ulcer of rectum 05/16/2015   Significant Hospital Events: Including procedures, antibiotic start and stop dates in addition to other pertinent events   10/26 - Presented to ED with abdominal pain. Concern for sepsis, febrile with leukocytosis, elevated LA (12). CT with bilateral mild hydroureteronephrosis, BCID +proteus. Vanc/Zosyn  narrowed to ceftriaxone .  Interim History / Subjective:  PCCM consulted for ICU evaluation for sepsis, hypotension. Patient received 2.5L IV fluids, broad-spectrum antibiotics Of note, Toprol -XL 25mg  administered at 0930 this AM  Objective:  Blood pressure (!) 95/57, pulse 78, temperature (!) 101.9 F (38.8 C), temperature source Rectal, resp. rate 16, height 5' 7 (1.702 m), weight 67.6 kg, SpO2 100%.        Intake/Output Summary (Last 24 hours) at 04/30/2024 1442 Last data filed at 04/30/2024 1425 Gross per 24 hour  Intake 3240.97 ml  Output 751 ml  Net 2489.97 ml   Filed Weights   04/30/24 0000  Weight: 67.6 kg   Physical Examination: General: Acutely ill-appearing elderly woman in NAD. HEENT: [place]/AT, anicteric sclera, moist mucous membranes. Neuro: Alert and oriented, responds to verbal stimuli. Following  commands consistently. Moves all 4 extremities spontaneously. Generalized weakness. CV: RRR, no m/g/r. PULM: Breathing even and unlabored on RA. Lung fields clear in upper fields, diminished at bases bilaterally. GI: Soft, mild generalized TTP, nondistended. Normoactive bowel  sounds. Extremities: No significant LE edema noted. Chronic wound to RLE (posterior calf) with surrounding pink scar tissue, Skin: Warm/dry, no rashes. RLE wound as above.  Resolved Hospital Problem List:    Assessment & Plan:  Sepsis with mild septic shock, presume 2/2 urosepsis Proteus bacteremia Lactic acidosis - Admit to ICU - Goal MAP > 65 - Fluid resuscitation as tolerated - Peripheral Levophed titrated to goal MAP, hopeful we can wean this off in the next 24H - Trend WBC, fever curve, LA - F/u Cx data - Continue broad-spectrum antibiotics (Vanc/Zosyn  narrowed to ceftriaxone )  HTN HLD CVA (residual R-sided deficits, on Eliquis ) CAD HFpEF PFO Echo 04/2020 55-60%, G1DD. - Hold home antihypertensives in the setting of hypotension/shock - Cardiac monitoring - Optimize electrolytes for K > 4, Mg > 2 - Continue Eliquis   AKI 2/2 urosepsis, nonobstructing renal calculi Mild bilateral hydroureteronephrosis Hypomagnesemia Hypophosphatemia - Trend BMP - Replete electrolytes as indicated - Monitor I&Os - F/u urine studies - Avoid nephrotoxic agents as able - Ensure adequate renal perfusion  T2DM - SSI - CBGs ACHS - Goal CBG 140-180  ABLA with history of GIB - Hgb WNL at present - Monitor for signs of active bleeding  Stercoral rectal ulcer - Bowel regimen (Miralax , docusate)  Chronic RLE wound (followed by wound care) - Appreciate WOC recommendations  Labs:  CBC: Recent Labs  Lab 04/29/24 1552 04/30/24 0442  WBC 23.8* 23.3*  NEUTROABS 21.6*  --   HGB 14.2 11.6*  HCT 45.3 36.6  MCV 87.5 86.7  PLT 190 135*   Basic Metabolic Panel: Recent Labs  Lab 04/29/24 1552 04/30/24 0318 04/30/24 0442  NA 136 137 139  K 4.5 4.4 4.5  CL 102 103 105  CO2 16* 22 22  GLUCOSE 305* 216* 211*  BUN 35* 30* 30*  CREATININE 1.54* 1.20* 1.21*  CALCIUM  9.7 8.8* 8.9  MG  --   --  1.5*  PHOS  --   --  2.4*   GFR: Estimated Creatinine Clearance: 36.7 mL/min (A) (by  C-G formula based on SCr of 1.21 mg/dL (H)). Recent Labs  Lab 04/29/24 1552 04/29/24 1634 04/29/24 1833 04/29/24 2046 04/30/24 0122 04/30/24 0442  WBC 23.8*  --   --   --   --  23.3*  LATICACIDVEN  --    < > 12.0* >9.0* 5.4* 4.5*   < > = values in this interval not displayed.   Liver Function Tests: Recent Labs  Lab 04/29/24 1552  AST 39  ALT 28  ALKPHOS 97  BILITOT 0.7  PROT 7.1  ALBUMIN 3.5   No results for input(s): LIPASE, AMYLASE in the last 168 hours. No results for input(s): AMMONIA in the last 168 hours.  ABG:    Component Value Date/Time   PHART 7.415 05/16/2015 0319   PCO2ART 32.9 (L) 05/16/2015 0319   PO2ART 118 (H) 05/16/2015 0319   HCO3 12.6 (L) 04/30/2024 0115   TCO2 23 12/03/2017 0825   ACIDBASEDEF 11.9 (H) 04/30/2024 0115   O2SAT 62.9 04/30/2024 0115   Coagulation Profile: No results for input(s): INR, PROTIME in the last 168 hours.  Cardiac Enzymes: No results for input(s): CKTOTAL, CKMB, CKMBINDEX, TROPONINI in the last 168 hours.  HbA1C: Hgb A1c MFr Bld  Date/Time Value  Ref Range Status  01/23/2024 04:54 AM 8.7 (H) 4.8 - 5.6 % Final    Comment:    (NOTE) Diagnosis of Diabetes The following HbA1c ranges recommended by the American Diabetes Association (ADA) may be used as an aid in the diagnosis of diabetes mellitus.  Hemoglobin             Suggested A1C NGSP%              Diagnosis  <5.7                   Non Diabetic  5.7-6.4                Pre-Diabetic  >6.4                   Diabetic  <7.0                   Glycemic control for                       adults with diabetes.    10/13/2022 11:06 PM 6.2 (H) 4.8 - 5.6 % Final    Comment:    (NOTE) Pre diabetes:          5.7%-6.4%  Diabetes:              >6.4%  Glycemic control for   <7.0% adults with diabetes    CBG: Recent Labs  Lab 04/30/24 0328 04/30/24 0753 04/30/24 1119  GLUCAP 210* 149* 190*   Review of Systems:   Review of systems completed  with pertinent positives/negatives outlined in above HPI.  Past Medical History:  She,  has a past medical history of Acute blood loss anemia, CHF (congestive heart failure) (HCC), Coronary artery disease involving native artery of transplanted heart without angina pectoris, CVA (cerebral infarction) (04/2015), Depression, Diabetes (HCC) (2016), Enchondroma of bone (2011), Fracture, intertrochanteric, right femur (HCC), History of lower GI bleeding, Hyperlipidemia, Hypertension, Patent foramen ovale (04/2015), Protein calorie malnutrition, and Stercoral ulcer of rectum (05/16/2015).   Surgical History:   Past Surgical History:  Procedure Laterality Date   CARDIAC SURGERY     Cath without stent   COLONOSCOPY N/A 05/15/2015   Procedure: COLONOSCOPY;  Surgeon: Gustav Shila GAILS, MD;  Location: [facility] ENDOSCOPY;  Service: Endoscopy;  Laterality: N/A;   FLEXIBLE SIGMOIDOSCOPY N/A 05/15/2015   Procedure: FLEXIBLE SIGMOIDOSCOPY;  Surgeon: Gustav Shila GAILS, MD;  Location: [facility] ENDOSCOPY;  Service: Endoscopy;  Laterality: N/A;  at bedside   INTRAMEDULLARY (IM) NAIL INTERTROCHANTERIC Right 06/12/2015   Procedure: INTRAMEDULLARY (IM) NAIL RIGHT HIP;  Surgeon: Evalene JONETTA Chancy, MD;  Location: [facility] OR;  Service: Orthopedics;  Laterality: Right;   TEE WITHOUT CARDIOVERSION N/A 05/05/2015   Procedure: TRANSESOPHAGEAL ECHOCARDIOGRAM (TEE);  Surgeon: Salena Negri, MD;  Location: [facility] ENDOSCOPY;  Service: Cardiovascular;  Laterality: N/A;   Social History:   reports that she quit smoking about 9 years ago. Her smoking use included cigarettes. She has never used smokeless tobacco. She reports that she does not drink alcohol and does not use drugs.   Family History:  Her family history includes Heart attack in her father; Heart failure in her mother; Hyperlipidemia in her mother; Hypertension in her mother.   Allergies: Allergies  Allergen Reactions   Porcine (Pork) Protein-Containing Drug Products Other (See  Comments)    To keep blood pressure down   Home Medications: Prior to Admission medications   Medication Sig Start  Date End Date Taking? Authorizing Provider  acetaminophen  (TYLENOL ) 325 MG tablet Take 2 tablets (650 mg total) by mouth every 6 (six) hours as needed. 03/28/24  Yes Dean Clarity, MD  atorvastatin  (LIPITOR) 20 MG tablet Take 1 tablet (20 mg total) by mouth daily. 03/28/24  Yes Dean Clarity, MD  ELIQUIS  5 MG TABS tablet Take 1 tablet (5 mg total) by mouth every 12 (twelve) hours. 03/28/24  Yes Dean Clarity, MD  gentamicin cream (GARAMYCIN) 0.1 % Apply 1 Application topically daily. 04/04/24  Yes [provider]  levETIRAcetam  (KEPPRA ) 500 MG tablet Take 1 tablet (500 mg total) by mouth every 12 (twelve) hours. 03/28/24  Yes Dean Clarity, MD  losartan  (COZAAR ) 25 MG tablet Take 0.5 tablets (12.5 mg total) by mouth daily. 03/28/24  Yes Dean Clarity, MD  metoprolol  succinate (TOPROL -XL) 25 MG 24 hr tablet Take 1 tablet (25 mg total) by mouth daily. 03/28/24  Yes Haviland, Julie, MD  polyethylene glycol (MIRALAX  / GLYCOLAX ) 17 g packet Take 17 g by mouth daily as needed for mild constipation or moderate constipation. Use Miralax  if Milk of Magnesia is contraindicated (Example: Dialysis resident) 03/28/24  Yes Dean Clarity, MD  TRESIBA  FLEXTOUCH 100 UNIT/ML FlexTouch Pen Inject 10 Units into the skin at bedtime. 03/29/24  Yes Towana Ozell BROCKS, MD  diclofenac  Sodium (VOLTAREN ) 1 % GEL Apply 4 g topically 4 (four) times daily. Patient not taking: Reported on 04/29/2024 03/28/24   Dean Clarity, MD    Critical care time:   The patient is critically ill with multiple organ system failure and requires high complexity decision making for assessment and support, frequent evaluation and titration of therapies, advanced monitoring, review of radiographic studies and interpretation of complex data.   Critical Care Time devoted to patient care services, exclusive of separately  billable procedures, described in this note is 36 minutes.  Corean CHRISTELLA Johntay Doolen, PA-C  Pulmonary & Critical Care 04/30/24 2:42 PM  Please see [email or web address] for pager details.  From 7A-7P if no response, please call [phone] After hours, please call ELink [phone]

## 2024-04-30 NOTE — ED Notes (Signed)
Report given to Kay, RN.

## 2024-04-30 NOTE — Progress Notes (Signed)
 LA up to 3.4 Additional 500cc LR bolus Vasopressors as needed to maintain MAP >65-- peripheral NE currently at 2mcg Repeat LA at 7pm  Leita SHAUNNA Gaskins, DO 04/30/24 5:25 PM [facility]  For contact information, see Amion. If no response to pager, please call PCCM consult pager. After hours, 7PM- 7AM, please call Elink.

## 2024-04-30 NOTE — Progress Notes (Signed)
 PROGRESS NOTE    NEYSA ARTS  FMW:[id number] DOB: Sep 27, 1945 DOA: 04/29/2024 PCP: Norleen Lynwood ORN, MD   Brief Narrative: This 78 yrs old female with hx of CVA,with right hemiparesis, HFpEF, HTN, HLD, DM2, seizure disorder with complex partial seizures, Chronic pressure ulcer heel, who presented with lower abdominal pain.  History is obtained from son who reports last night 10 PM she started complaining about Severe abdominal pain and was unable to sleep.  Her PCP has advised them to take her to the ED.  She has been agitated for last 1 week.  She is found to have severe sepsis secondary to complicated UTI in the setting of nonobstructive right renal stone.  Assessment & Plan:   Principal Problem:   Sepsis (HCC) Active Problems:   AKI (acute kidney injury)   Elevated troponin  Severe sepsis secondary to complicated UTI: Lactic acidosis: Nonobstructive right renal stone: Bilateral hydronephrosis,  Suspect bladder outlet obstruction: Patient presented with lower abdominal pain, She was tachycardic,  tachypneic with leukocytosis with bandemia, lactic acidosis peaked at 12.0.   Recent micro review showed pansensitive E faecalis prior results include pansensitive Pseudomonas,  ampicillin  resistant Klebsiella pneumonia. Continue IV vancomycin and IV Zosyn . Continue IV fluid resuscitation. EDP has discussed the case with urology about nonobstructive renal stone.   Urologist recommended no immediate plans for intervention.   Hydronephrosis likely secondary to bladder outlet obstruction,  recommended Foley catheter.  Follow blood cultures. Lactic acid improving with IV hydration.  Continue to monitor.  AKI: Likely in the setting of sepsis. Serum creatinine 1.54 at admission,  baseline> normal. Continue Foley catheter, Continue IV antibiotics and IV hydration. Monitor serum creatinine.  Elevated troponin: Likely demand ischemia in the setting of sepsis. EKG without ischemic changes.   Troponin 96 > 91.   Patient denies any chest pain.  Acute metabolic Encephalopathy: Likely in the setting of sepsis and chronic dementia. Continue delirium and fall precautions. She is slowly improving.  History of CVA, right hemiparesis: On DOAC, Continue home atorvastatin .  HFpEF:  Not on diuretics. Appears euvolumic.  Hypertension:  Hold home losartan  due to AKI.  Hyperlipidemia: Continue Lipitor.  Diabetes type 2:  Continue home basal 10 units nightly, SSI for very sensitive and at bedtime correction.  Seizure disorder, complex partial seizures:  Continue home Keppra .  Chronic pressure ulcer of heel:  Wound care. Turn for pressure relief. Pressure relief bed.   Debility: Per recent PCP note, bedridden over past 1 year.  PT/OT to maintain limited level of functioning.   DVT prophylaxis: Eliquis  Code Status: Full code Family Communication: No family at bedside Disposition Plan:    Status is: Inpatient Remains inpatient appropriate because: Severity of illness.  Consultants:  Urology  Procedures:  Antimicrobials:  Anti-infectives (From admission, onward)    Start     Dose/Rate Route Frequency Ordered Stop   04/30/24 1200  cefTRIAXone  (ROCEPHIN ) 2 g in sodium chloride  0.9 % 100 mL IVPB        2 g 200 mL/hr over 30 Minutes Intravenous Every 24 hours 04/30/24 0949     04/30/24 0200  piperacillin -tazobactam (ZOSYN ) IVPB 3.375 g  Status:  Discontinued        3.375 g 12.5 mL/hr over 240 Minutes Intravenous Every 8 hours 04/29/24 2345 04/30/24 0949   04/30/24 0111  vancomycin variable dose per unstable renal function (pharmacist dosing)  Status:  Discontinued         Does not apply See admin instructions 04/30/24 0111 04/30/24  9050   04/29/24 2000  vancomycin (VANCOREADY) IVPB 1250 mg/250 mL        1,250 mg 166.7 mL/hr over 90 Minutes Intravenous  Once 04/29/24 1959 04/29/24 2248   04/29/24 1715  piperacillin -tazobactam (ZOSYN ) IVPB 3.375 g        3.375 g 100  mL/hr over 30 Minutes Intravenous  Once 04/29/24 1708 04/29/24 1834      Subjective: Patient was seen and examined at bedside. Overnight events noted. Patient appears sick looking, severely deconditioned, not in any acute distress.  Objective: Vitals:   04/30/24 0818 04/30/24 0915 04/30/24 0932 04/30/24 0941  BP: 115/64 115/67 114/64   Pulse: 94 92 92   Resp: (!) 22 (!) 24    Temp:    98.1 F (36.7 C)  TempSrc:    Oral  SpO2: 100% 100%    Weight:      Height:        Intake/Output Summary (Last 24 hours) at 04/30/2024 1116 Last data filed at 04/30/2024 0933 Gross per 24 hour  Intake 2137.57 ml  Output --  Net 2137.57 ml   Filed Weights   04/30/24 0000  Weight: 67.6 kg    Examination:  General exam: Appears calm and comfortable , not in any acute distress. Respiratory system: CTA Bilaterally. Respiratory effort normal. RR 18 Cardiovascular system: S1 & S2 heard, RRR. No JVD, murmurs, rubs, gallops or clicks.  Gastrointestinal system: Abdomen is non distended, soft and nontender. Normal bowel sounds heard. Central nervous system: Alert and oriented X 2. No focal neurological deficits. Extremities: No edema, no cyanosis, right-sided weakness Skin: No rashes, lesions or ulcers Psychiatry: Judgement and insight appear normal. Mood & affect appropriate.     Data Reviewed: I have personally reviewed following labs and imaging studies  CBC: Recent Labs  Lab 04/29/24 1552 04/30/24 0442  WBC 23.8* 23.3*  NEUTROABS 21.6*  --   HGB 14.2 11.6*  HCT 45.3 36.6  MCV 87.5 86.7  PLT 190 135*   Basic Metabolic Panel: Recent Labs  Lab 04/29/24 1552 04/30/24 0318 04/30/24 0442  NA 136 137 139  K 4.5 4.4 4.5  CL 102 103 105  CO2 16* 22 22  GLUCOSE 305* 216* 211*  BUN 35* 30* 30*  CREATININE 1.54* 1.20* 1.21*  CALCIUM  9.7 8.8* 8.9  MG  --   --  1.5*  PHOS  --   --  2.4*   GFR: Estimated Creatinine Clearance: 36.7 mL/min (A) (by C-G formula based on SCr of 1.21  mg/dL (H)). Liver Function Tests: Recent Labs  Lab 04/29/24 1552  AST 39  ALT 28  ALKPHOS 97  BILITOT 0.7  PROT 7.1  ALBUMIN 3.5   No results for input(s): LIPASE, AMYLASE in the last 168 hours. No results for input(s): AMMONIA in the last 168 hours. Coagulation Profile: No results for input(s): INR, PROTIME in the last 168 hours. Cardiac Enzymes: No results for input(s): CKTOTAL, CKMB, CKMBINDEX, TROPONINI in the last 168 hours. BNP (last 3 results) No results for input(s): PROBNP in the last 8760 hours. HbA1C: No results for input(s): HGBA1C in the last 72 hours. CBG: Recent Labs  Lab 04/30/24 0328 04/30/24 0753  GLUCAP 210* 149*   Lipid Profile: No results for input(s): CHOL, HDL, LDLCALC, TRIG, CHOLHDL, LDLDIRECT in the last 72 hours. Thyroid  Function Tests: No results for input(s): TSH, T4TOTAL, FREET4, T3FREE, THYROIDAB in the last 72 hours. Anemia Panel: No results for input(s): VITAMINB12, FOLATE, FERRITIN, TIBC, IRON, RETICCTPCT in  the last 72 hours. Sepsis Labs: Recent Labs  Lab 04/29/24 1833 04/29/24 2046 04/30/24 0122 04/30/24 0442  LATICACIDVEN 12.0* >9.0* 5.4* 4.5*    Recent Results (from the past 240 hours)  Culture, blood (Routine x 2)     Status: None (Preliminary result)   Collection Time: 04/29/24  3:52 PM   Specimen: BLOOD RIGHT FOREARM  Result Value Ref Range Status   Specimen Description BLOOD RIGHT FOREARM  Final   Special Requests   Final    BOTTLES DRAWN AEROBIC AND ANAEROBIC Blood Culture adequate volume   Culture  Setup Time   Final    ANAEROBIC BOTTLE ONLY GRAM NEGATIVE RODS CRITICAL RESULT CALLED TO, READ BACK BY AND VERIFIED WITH: PHARMD I.JIMENEZ AT 0829 ON 04/30/2024 BY T.SAAD. Performed at [facility], [address] [place], [place] [address]    Culture GRAM NEGATIVE RODS  Final   Report Status PENDING  Incomplete  Blood Culture ID Panel (Reflexed)     Status:  Abnormal   Collection Time: 04/29/24  3:52 PM  Result Value Ref Range Status   Enterococcus faecalis NOT DETECTED NOT DETECTED Final   Enterococcus Faecium NOT DETECTED NOT DETECTED Final   Listeria monocytogenes NOT DETECTED NOT DETECTED Final   Staphylococcus species NOT DETECTED NOT DETECTED Final   Staphylococcus aureus (BCID) NOT DETECTED NOT DETECTED Final   Staphylococcus epidermidis NOT DETECTED NOT DETECTED Final   Staphylococcus lugdunensis NOT DETECTED NOT DETECTED Final   Streptococcus species NOT DETECTED NOT DETECTED Final   Streptococcus agalactiae NOT DETECTED NOT DETECTED Final   Streptococcus pneumoniae NOT DETECTED NOT DETECTED Final   Streptococcus pyogenes NOT DETECTED NOT DETECTED Final   A.calcoaceticus-baumannii NOT DETECTED NOT DETECTED Final   Bacteroides fragilis NOT DETECTED NOT DETECTED Final   Enterobacterales DETECTED (A) NOT DETECTED Final    Comment: Enterobacterales represent a large order of gram negative bacteria, not a single organism. CRITICAL RESULT CALLED TO, READ BACK BY AND VERIFIED WITH: PHARMD I.JIMENEZ AT 9170 ON 04/30/2024 BY T.SAAD.    Enterobacter cloacae complex NOT DETECTED NOT DETECTED Final   Escherichia coli NOT DETECTED NOT DETECTED Final   Klebsiella aerogenes NOT DETECTED NOT DETECTED Final   Klebsiella oxytoca NOT DETECTED NOT DETECTED Final   Klebsiella pneumoniae NOT DETECTED NOT DETECTED Final   Proteus species DETECTED (A) NOT DETECTED Final    Comment: CRITICAL RESULT CALLED TO, READ BACK BY AND VERIFIED WITH: PHARMD I.JIMENEZ AT 9170 ON 04/30/2024 BY T.SAAD.    Salmonella species NOT DETECTED NOT DETECTED Final   Serratia marcescens NOT DETECTED NOT DETECTED Final   Haemophilus influenzae NOT DETECTED NOT DETECTED Final   Neisseria meningitidis NOT DETECTED NOT DETECTED Final   Pseudomonas aeruginosa NOT DETECTED NOT DETECTED Final   Stenotrophomonas maltophilia NOT DETECTED NOT DETECTED Final   Candida albicans NOT  DETECTED NOT DETECTED Final   Candida auris NOT DETECTED NOT DETECTED Final   Candida glabrata NOT DETECTED NOT DETECTED Final   Candida krusei NOT DETECTED NOT DETECTED Final   Candida parapsilosis NOT DETECTED NOT DETECTED Final   Candida tropicalis NOT DETECTED NOT DETECTED Final   Cryptococcus neoformans/gattii NOT DETECTED NOT DETECTED Final   CTX-M ESBL NOT DETECTED NOT DETECTED Final   Carbapenem resistance IMP NOT DETECTED NOT DETECTED Final   Carbapenem resistance KPC NOT DETECTED NOT DETECTED Final   Carbapenem resistance NDM NOT DETECTED NOT DETECTED Final   Carbapenem resist OXA 48 LIKE NOT DETECTED NOT DETECTED Final   Carbapenem resistance  VIM NOT DETECTED NOT DETECTED Final    Comment: Performed at [facility], [address], [place], [place] [address]  Resp panel by RT-PCR (RSV, Flu A&B, Covid) Anterior Nasal Swab     Status: None   Collection Time: 04/29/24  5:07 PM   Specimen: Anterior Nasal Swab  Result Value Ref Range Status   SARS Coronavirus 2 by RT PCR NEGATIVE NEGATIVE Final   Influenza A by PCR NEGATIVE NEGATIVE Final   Influenza B by PCR NEGATIVE NEGATIVE Final    Comment: (NOTE) The Xpert Xpress SARS-CoV-2/FLU/RSV plus assay is intended as an aid in the diagnosis of influenza from Nasopharyngeal swab specimens and should not be used as a sole basis for treatment. Nasal washings and aspirates are unacceptable for Xpert Xpress SARS-CoV-2/FLU/RSV testing.  Fact Sheet for Patients: bloggercourse.com  Fact Sheet for Healthcare Providers: seriousbroker.it  This test is not yet approved or cleared by the [place]  FDA and has been authorized for detection and/or diagnosis of SARS-CoV-2 by FDA under an Emergency Use Authorization (EUA). This EUA will remain in effect (meaning this test can be used) for the duration of the COVID-19 declaration under Section 564(b)(1) of the Act, 21 U.S.C. section  360bbb-3(b)(1), unless the authorization is terminated or revoked.     Resp Syncytial Virus by PCR NEGATIVE NEGATIVE Final    Comment: (NOTE) Fact Sheet for Patients: bloggercourse.com  Fact Sheet for Healthcare Providers: seriousbroker.it  This test is not yet approved or cleared by the [place]  FDA and has been authorized for detection and/or diagnosis of SARS-CoV-2 by FDA under an Emergency Use Authorization (EUA). This EUA will remain in effect (meaning this test can be used) for the duration of the COVID-19 declaration under Section 564(b)(1) of the Act, 21 U.S.C. section 360bbb-3(b)(1), unless the authorization is terminated or revoked.  Performed at [facility], [address], [place], [place] [address]   Culture, blood (Routine x 2)     Status: None (Preliminary result)   Collection Time: 04/29/24  5:23 PM   Specimen: BLOOD LEFT ARM  Result Value Ref Range Status   Specimen Description BLOOD LEFT ARM  Final   Special Requests   Final    AEROBIC BOTTLE ONLY Blood Culture results may not be optimal due to an inadequate volume of blood received in culture bottles   Culture   Final    NO GROWTH < 24 HOURS Performed at [facility], [address], [place], [place] [address]    Report Status PENDING  Incomplete    Radiology Studies: CT Chest W Contrast Result Date: 04/29/2024 EXAM: CT CHEST WITH CONTRAST 04/29/2024 08:51:46 PM TECHNIQUE: CT of the chest was performed with the administration of 75 mL of iohexol (OMNIPAQUE) 350 MG/ML injection. Multiplanar reformatted images are provided for review. Automated exposure control, iterative reconstruction, and/or weight based adjustment of the mA/kV was utilized to reduce the radiation dose to as low as reasonably achievable. COMPARISON: 12/31/2007 CLINICAL HISTORY: FINDINGS: MEDIASTINUM: Mild cardiomegaly. Three-vessel coronary artery calcifications. Thoracic  aortic atherosclerosis. Right pulmonary artery enlargement, suggestive of pulmonary hypertension. The central airways are clear. LYMPH NODES: No mediastinal, hilar or axillary lymphadenopathy. LUNGS AND PLEURA: Small left pleural effusion. Patchy bilateral lower lobe opacities, left greater than right, suggesting atelectasis. Mild patchy opacities in the posterior upper lobes and more likely atelectasis. Possible 8 mm subpleural nodular opacity at the left lung apex (image 27). No pneumothorax. SOFT TISSUES/BONES: No acute abnormality of the  bones or soft tissues. UPPER ABDOMEN: Limited images of the upper abdomen demonstrates no acute abnormality. IMPRESSION: 1. Mild cardiomegaly. No frank interstitial edema. 2. Small left pleural effusion. 3. Equivocal 8 mm subpleural nodular opacity at the left lung apex, possibly infectious/inflammatory. Consider follow-up CT chest in 3 months to assess for persistence. 4. Suspected pulmonary arterial hypertension. Electronically signed by: Pinkie Pebbles MD 04/29/2024 09:24 PM EDT RP Workstation: [id number]   CT Angio Abd/Pel W and/or Wo Contrast Result Date: 04/29/2024 EXAM: CTA ABDOMEN AND PELVIS WITH CONTRAST 04/29/2024 08:51:46 PM TECHNIQUE: CTA images of the abdomen and pelvis with intravenous contrast. Three-dimensional MIP/volume rendered formations were performed. Automated exposure control, iterative reconstruction, and/or weight based adjustment of the mA/kV was utilized to reduce the radiation dose to as low as reasonably achievable. COMPARISON: 11/22/2017 CLINICAL HISTORY: FINDINGS: VASCULATURE: AORTA: Atherosclerotic calcifications of the aorta, although patent. No acute finding. No abdominal aortic aneurysm. No dissection. CELIAC TRUNK: No acute finding. No occlusion or significant stenosis. SUPERIOR MESENTERIC ARTERY: Atherosclerotic calcifications of the origin of the SMA, patent. No acute finding. No occlusion or significant stenosis. RENAL ARTERIES:  Atherosclerotic calcifications of the origin of the bilateral renal arteries, patent. No acute finding. No occlusion or significant stenosis. ILIAC ARTERIES: Atherosclerotic calcifications of the bilateral iliac arteries, patent. No acute finding. No occlusion or significant stenosis. LIVER: The liver is unremarkable. GALLBLADDER AND BILE DUCTS: Layering gallstones, without associated inflammatory changes. No biliary ductal dilatation. SPLEEN: The spleen is unremarkable. PANCREAS: The pancreas is unremarkable. ADRENAL GLANDS: Bilateral adrenal glands demonstrate no acute abnormality. KIDNEYS, URETERS AND BLADDER: Subcentimeter bilateral upper pole renal cysts, benign. No follow-up is recommended. Suspected 6 mm nonobstructing interpolar left renal calculus (image 79). Mild bilateral hydroureteronephrosis, new from remote prior, but possibly reflecting neurogenic bladder / chronic bladder outlet obstruction. No stones in the ureters. No perinephric or periureteral stranding. GI AND BOWEL: Moderate hiatal hernia. Mild rectal fecal impaction, without inflammatory changes to suggest stercoral colitis. No secondary wall thickening or inflammatory changes involving bowel to suggest ischemia. Stomach and duodenal sweep demonstrate no acute abnormality. There is no bowel obstruction. No abnormal bowel wall thickening or distension. REPRODUCTIVE: Status post hysterectomy. PERITONEUM AND RETRPERITONEUM: No ascites or free air. LUNG BASE: No acute abnormality. LYMPH NODES: No lymphadenopathy. BONES AND SOFT TISSUES: Status post ORIF of the right hip. Small fat-containing bilateral inguinal hernias. No acute abnormality of the bones. No acute soft tissue abnormality. IMPRESSION: 1. Mesenteric vessels are patent.  No findings to suggest mesenteric ischemia. 2. Mild diffuse irregular bladder wall thickening with perivesical stranding, favoring cystitis. Consider cystoscopy for further evaluation, as clinically warranted. 3. Mild  bilateral hydroureteronephrosis, possibly reflecting neurogenic bladder or chronic bladder outlet obstruction. 4. 6 mm nonobstructing right renal calculus. Layering tiny bladder calculi measuring up to 2 mm, poorly visualized. Electronically signed by: Pinkie Pebbles MD 04/29/2024 09:07 PM EDT RP Workstation: [id number]   DG Tibia/Fibula Right Result Date: 04/29/2024 CLINICAL DATA:  Leg wound, concern for osteomyelitis. EXAM: RIGHT TIBIA AND FIBULA - 2 VIEW COMPARISON:  10/13/2022. FINDINGS: There is diffusely decreased mineralization of the bones. Fixation hardware is present in the distal femur with old healed fracture deformity. There is bony deformity of the medial tibial plateau, compatible with known old fracture. No periosteal elevation or bony erosions. Vascular calcifications are noted. There is moderate calcaneal spurring. Soft tissue swelling is present at the right lower extremity laterally. IMPRESSION: No radiographic evidence of osteomyelitis Electronically Signed   By: Leita Waddell HERO.D.  On: 04/29/2024 17:48   DG Chest Port 1 View Result Date: 04/29/2024 EXAM: 1 VIEW(S) XRAY OF THE CHEST 04/29/2024 04:13:00 PM COMPARISON: 01/19/2024 CLINICAL HISTORY: Suspected sepsis FINDINGS: LUNGS AND PLEURA: Low lung volumes. Streaky opacities in left lung base. No pulmonary edema. No pleural effusion. No pneumothorax. HEART AND MEDIASTINUM: Aortic atherosclerosis. BONES AND SOFT TISSUES: No acute osseous abnormality. Small hiatal hernia. IMPRESSION: 1. Low lung volumes with streaky opacities in the left lung base. Electronically signed by: Norleen Boxer MD 04/29/2024 04:52 PM EDT RP Workstation: [id number]   Scheduled Meds:  apixaban   5 mg Oral Q12H   atorvastatin   20 mg Oral Daily   gentamicin ointment   Topical Daily   insulin  aspart  0-5 Units Subcutaneous QHS   insulin  aspart  0-6 Units Subcutaneous TID WC   insulin  glargine-yfgn  10 Units Subcutaneous QHS   levETIRAcetam   500 mg Oral Q12H    melatonin  6 mg Oral QHS   metoprolol  succinate  25 mg Oral Daily   polyethylene glycol  17 g Oral Daily   senna  1 tablet Oral QHS   sodium chloride  flush  3 mL Intravenous Q12H   Continuous Infusions:  cefTRIAXone  (ROCEPHIN )  IV     lactated ringers  Stopped (04/30/24 0716)     LOS: 1 day    Time spent: 50 mins    Darcel Dawley, MD [facility]   If 7PM-7AM, please contact night-coverage

## 2024-04-30 NOTE — ED Notes (Addendum)
 Order given to bolus NS , per Leotis, MD

## 2024-04-30 NOTE — ED Notes (Signed)
Admitting MD notified of hypotension. See new orders.

## 2024-04-30 NOTE — ED Notes (Signed)
Pt cleaned and repositioned in the bed.

## 2024-04-30 NOTE — Progress Notes (Signed)
 Pharmacy Antibiotic Note  [name] is a 78 y.o. female admitted on 04/29/2024 with sepsis.  Pharmacy has been consulted for vancomycin dosing.  Pt w/ AKI d/t bladder outlet obstruction; baseline SCr ~0.7, now 1.54.  Plan: Rec'd vanc 1250mg  in ED; will hold off on further dosing and monitor SCr; if SCr stabilizes vanc 750-1000mg  IV Q24H will be ordered.  Height: 5' 7 (170.2 cm) Weight: 67.6 kg (149 lb) IBW/kg (Calculated) : 61.6  Temp (24hrs), Avg:99.2 F (37.3 C), Min:98.7 F (37.1 C), Max:99.7 F (37.6 C)  Recent Labs  Lab 04/29/24 1552 04/29/24 1634 04/29/24 1833 04/29/24 2046  WBC 23.8*  --   --   --   CREATININE 1.54*  --   --   --   LATICACIDVEN  --  11.0* 12.0* >9.0*    Estimated Creatinine Clearance: 28.8 mL/min (A) (by C-G formula based on SCr of 1.54 mg/dL (H)).    Allergies  Allergen Reactions   Porcine (Pork) Protein-Containing Drug Products Other (See Comments)    To keep blood pressure down    Thank you for allowing pharmacy to be a part of this patient's care.  Marvetta Dauphin, PharmD, BCPS  04/30/2024 1:06 AM

## 2024-04-30 NOTE — ED Provider Notes (Incomplete)
 [place] EMERGENCY DEPARTMENT AT [facility] Provider Note   CSN: [id number] Arrival date & time: 04/29/24  1539     Patient presents with: Abdominal Pain   RHONNA HOLSTER is a 78 y.o. female.   Abdominal Pain Associated symptoms: nausea and shortness of breath   Patient is a 78 year old female presenting ED today for concerns for acute onset abdominal pain that began acutely last night.  Patient at mental baseline status according to son who is at bedside.  Patient reporting abdominal pain worse on right than left, lower abdominal pain.  Son notes that she had also been mildly more short of breath than her normal.  Currently seeing wound care for right lower extremity wound.  Patient reports pain improved at this time.  Notably had accompanying vomiting throughout the night, unable to tolerate p.o.  Called PCP and was told to bring her to the emergency department for evaluation.  Previous medical history of CHF, CVA with right-sided deficits currently anticoagulated Eliquis , CAD, HLD, HTN, diabetes, sepsis, CHF, stercoral ulcer of rectum, endochondroma of bone, AF.  Denies fever, headache, vision changes, chest pain, shortness of breath, hematemesis, dysuria, hematuria, melena, hematochezia, diarrhea, lower leg swelling.     Prior to Admission medications   Medication Sig Start Date End Date Taking? Authorizing Provider  acetaminophen  (TYLENOL ) 325 MG tablet Take 2 tablets (650 mg total) by mouth every 6 (six) hours as needed. 03/28/24   Dean Clarity, MD  atorvastatin  (LIPITOR) 20 MG tablet Take 1 tablet (20 mg total) by mouth daily. 03/28/24   Dean Clarity, MD  Cholecalciferol  (VITAMIN D3) 50 MCG (2000 UT) TABS Take 1 tablet by mouth daily.    [provider]  diclofenac  Sodium (VOLTAREN ) 1 % GEL Apply 4 g topically 4 (four) times daily. 03/28/24   Dean Clarity, MD  ELIQUIS  5 MG TABS tablet Take 1 tablet (5 mg total) by mouth every 12 (twelve) hours.  03/28/24   Dean Clarity, MD  EMBECTA AUTOSHIELD DUO 30G X 5 MM MISC  01/26/24   [provider]  gentamicin cream (GARAMYCIN) 0.1 % Apply topically daily. 04/04/24   [provider]  guaiFENesin  (ROBITUSSIN) 100 MG/5ML liquid Take 10 mLs by mouth every 4 (four) hours as needed for cough or to loosen phlegm.    [provider]  levETIRAcetam  (KEPPRA ) 500 MG tablet Take 1 tablet (500 mg total) by mouth every 12 (twelve) hours. 03/28/24   Dean Clarity, MD  losartan  (COZAAR ) 25 MG tablet Take 0.5 tablets (12.5 mg total) by mouth daily. 03/28/24   Haviland, Julie, MD  metoprolol  succinate (TOPROL -XL) 25 MG 24 hr tablet Take 1 tablet (25 mg total) by mouth daily. 03/28/24   Haviland, Julie, MD  polyethylene glycol (MIRALAX  / GLYCOLAX ) 17 g packet Take 17 g by mouth daily as needed for mild constipation or moderate constipation. Use Miralax  if Milk of Magnesia is contraindicated (Example: Dialysis resident) 03/28/24   Dean Clarity, MD  TRESIBA  FLEXTOUCH 100 UNIT/ML FlexTouch Pen Inject 10 Units into the skin at bedtime. 03/29/24   Towana Ozell BROCKS, MD    Allergies: Porcine (pork) protein-containing drug products    Review of Systems  Respiratory:  Positive for shortness of breath.   Gastrointestinal:  Positive for abdominal pain and nausea.  All other systems reviewed and are negative.   Updated Vital Signs BP 123/65 (BP Location: Left Arm)   Pulse (!) 117   Temp 99.7 F (37.6 C) (Oral)   Resp ROLLEN)  26   SpO2 100%   Physical Exam Vitals and nursing note reviewed.  Constitutional:      General: She is not in acute distress.    Appearance: Normal appearance. She is ill-appearing. She is not diaphoretic.  HENT:     Head: Normocephalic and atraumatic.  Eyes:     General: No scleral icterus.       Right eye: No discharge.        Left eye: No discharge.     Extraocular Movements: Extraocular movements intact.     Conjunctiva/sclera: Conjunctivae normal.   Cardiovascular:     Rate and Rhythm: Regular rhythm. Tachycardia present.     Pulses: Normal pulses.     Heart sounds: Normal heart sounds. No murmur heard.    No friction rub. No gallop.  Pulmonary:     Effort: Pulmonary effort is normal. Tachypnea present. No respiratory distress.     Breath sounds: No stridor. No wheezing, rhonchi or rales.  Chest:     Chest wall: No tenderness.  Abdominal:     General: Abdomen is flat. There is no distension.     Palpations: Abdomen is soft.     Tenderness: There is generalized abdominal tenderness (Generalized lower abdominal tenderness worse on right than left.) and tenderness in the right lower quadrant, suprapubic area and left lower quadrant. There is no guarding or rebound. Positive signs include McBurney's sign.  Musculoskeletal:        General: No swelling, deformity or signs of injury.     Cervical back: Normal range of motion. No rigidity.     Right lower leg: No edema.     Left lower leg: No edema.  Skin:    General: Skin is warm and dry.     Findings: Lesion (Notably has lesion to right lower extremity with picture noted in chart) present. No bruising or erythema.  Neurological:     Mental Status: She is alert and oriented to person, place, and time. Mental status is at baseline.     Comments: At baseline with right side deficits.  Psychiatric:        Mood and Affect: Mood normal.     (all labs ordered are listed, but only abnormal results are displayed) Labs Reviewed  CULTURE, BLOOD (ROUTINE X 2)  CULTURE, BLOOD (ROUTINE X 2)  COMPREHENSIVE METABOLIC PANEL WITH GFR  CBC WITH DIFFERENTIAL/PLATELET  URINALYSIS, W/ REFLEX TO CULTURE (INFECTION SUSPECTED)  PROTIME-INR  I-STAT CG4 LACTIC ACID, ED    EKG: EKG Interpretation Date/Time:  Sunday April 29 2024 16:13:18 EDT Ventricular Rate:  111 PR Interval:  147 QRS Duration:  96 QT Interval:  348 QTC Calculation: 473 R Axis:   -53  Text Interpretation: Sinus tachycardia  Inferior infarct, old Confirmed by Simon Rea [phone]) on 04/29/2024 4:17:19 PM  Radiology: No results found.  .Critical Care  Performed by: Beola Terrall RAMAN, PA-C Authorized by: Beola Terrall RAMAN, PA-C   Critical care provider statement:    Critical care time (minutes):  70   Critical care was necessary to treat or prevent imminent or life-threatening deterioration of the following conditions:  Sepsis   Critical care was time spent personally by me on the following activities:  Development of treatment plan with patient or surrogate, discussions with consultants, evaluation of patient's response to treatment, examination of patient, ordering and review of laboratory studies, ordering and review of radiographic studies, ordering and performing treatments and interventions, pulse oximetry, re-evaluation of patient's condition, review of  old charts and obtaining history from patient or surrogate   I assumed direction of critical care for this patient from another provider in my specialty: yes     Care discussed with: admitting provider      Medications Ordered in the ED - No data to display  Clinical Course as of 04/30/24 0000  Sun Apr 29, 2024  1648 Started last night. Vomited throughout the night. Provided stool softener. Bowel movement this am, normal. Taking eliquis  with no missed doses.  [CB]  1955 Previous nursing staff was unable to obtain IV.  New nursing staff was able to obtain IV, which is what CT was waiting for for imaging.  CT was relayed information and does not come to get patient. [CB]  2240 Spoke with urologist, Dr. Devere, who did not believe any surgical process present this time, recommended admit to medicine, treat for UTI, enemas for stool ball, Foley catheter.  [CB]  2341 Spoke with hospitalist, Dr. Keturah who accepted patient care at this time. [CB]    Clinical Course User Index [CB] Beola Terrall RAMAN, PA-C   Medical Decision Making Amount and/or Complexity of Data  Reviewed Labs: ordered. Radiology: ordered.  Risk Prescription drug management. Decision regarding hospitalization.   This patient is a 78 year old female who presents to the ED for concern of septic appearing after experiencing lower abdominal pain acute onset last night with persistent nausea and vomiting throughout today.  Notably had bowel movement this morning which was normal.  With son at bedside he reports some mild shortness of breath but otherwise  reported no other complaints.  On physical exam, patient is in no acute distress, afebrile, alert and able to answer questions appropriately.  Notably was tachycardic as well as mildly tachypneic.  Abdomen is very tender to palpation bilaterally worse on right than left.  Notably also has wound to right lower extremity which history by wound care, picture noted in chart.   On reevaluation, patient notes that her pain has improved.  But lactic acid still elevated despite antibiotics and fluids.  Ordered continued lactic acid levels as well as ordered vancomycin due to x-ray showing streaky opacities at left lower lung base.  Differential diagnoses prior to evaluation: The emergent differential diagnosis includes, but is not limited to,  Mesenteric ischemia, diverticulitis, nephrolithiasis, constipation, bowel obstruction, IBD, ovarian torsion, PID,  . This is not an exhaustive differential.   Past Medical History / Co-morbidities / Social History: CHF, CVA with right-sided deficits currently anticoagulated Eliquis , CAD, HLD, HTN, diabetes, sepsis, CHF, stercoral ulcer of rectum, endochondroma of bone.  Additional history: Chart reviewed. Pertinent results include:   Last seen by PCP on 04/23/2024.  Notably having intermittent constipation at that time.  Admitted on 01/19/2024 for UTI.  Seen again in the emergency department on 03/28/2024 for concerns for CVA.  Lab Tests/Imaging studies: I personally interpreted labs/imaging and the  pertinent results include:  ***.   ***I agree with the radiologist interpretation.  Cardiac monitoring: EKG obtained and interpreted by myself and attending physician which shows: ***   Medications: I ordered medication including ***.  I have reviewed the patients home medicines and have made adjustments as needed.  Critical Interventions:  Social Determinants of Health:  Disposition: After consideration of the diagnostic results and the patients response to treatment, I feel that the patient would benefit from ***.   ***emergency department workup does not suggest an emergent condition requiring admission or immediate intervention beyond what has been performed at  this time. The plan is: ***. The patient is safe for discharge and has been instructed to return immediately for worsening symptoms, change in symptoms or any other concerns.  Final diagnoses:  None    ED Discharge Orders     None

## 2024-04-30 NOTE — Consult Note (Addendum)
 WOC Nurse wound follow up Refer to consult notes from earlier this morning; [employer] formulary does not carry collagen dressings but son is at the bedside with supplies from home and would like to assess the wounds during the dressing change.  Pt is followed by the outpatient wound care center prior to admission and dressings are changed Q M/W/F as outlined below. Pt tolerated dressing change without discomfort.  There are 3 full thickness wounds to right posterior calf, separated by narrow bridges of intact skin, 50% pink, 50% brown. No odor, drainage, or fluctuance. Each wound is approx .2X.2cm.  Affected area to right posterior leg is 6X2X.2cm. Pink dry scar tissue surrounding.  Son at the bedside assessed the appearance and states the wound is improving.     Son requests to meet a WOC team member at 0900 on Wed for the next dressing change and he will bring in the supplies from home.   Topical treatment orders as previously ordered by the outpatient wound care center: Change dressing to right leg Q Mon/Wed/Fri. Cleanse R lower leg wound with NS, apply antibiotic ointment to wound beds (3) then cover with collagen pieces cut to fit wound beds. Cover with silicone foam dressing and then an ABD pad and secure with Kerlix roll gauze beginning right above toes and ending above wound. Apply Ace bandage wrapped in same fashion as Kerlix for light compression. Prop one pillow behind knee and another under right heel to reduce pressure.  Thank-you,  Stephane Fought MSN, RN, CWOCN, CWCN-AP, CNS Contact Mon-Fri 0700-1500: [phone]

## 2024-04-30 NOTE — Progress Notes (Signed)
 [facility] in 1/4  PHARMACY - PHYSICIAN COMMUNICATION CRITICAL VALUE ALERT - BLOOD CULTURE IDENTIFICATION (BCID)  [name] is an 78 y.o. female who presented to [facility] on 04/29/2024 with a chief complaint of abdominal pain  Assessment:  74 YOF with concern for complicated UTI with nonobstructive stones now with 1 of 4 blood cultures growing GNR and BCID detecting proteus species - presumed urinary source.  Name of physician (or Provider) Contacted: Leotis  Current antibiotics: Vancomycin + Zosyn   Changes to prescribed antibiotics recommended:  De-escalate to Ceftriaxone  2g IV every 24 hours monotherapy - recommendation accepted by provider  Results for orders placed or performed during the hospital encounter of 04/29/24  Blood Culture ID Panel (Reflexed) (Collected: 04/29/2024  3:52 PM)  Result Value Ref Range   Enterococcus faecalis NOT DETECTED NOT DETECTED   Enterococcus Faecium NOT DETECTED NOT DETECTED   Listeria monocytogenes NOT DETECTED NOT DETECTED   Staphylococcus species NOT DETECTED NOT DETECTED   Staphylococcus aureus (BCID) NOT DETECTED NOT DETECTED   Staphylococcus epidermidis NOT DETECTED NOT DETECTED   Staphylococcus lugdunensis NOT DETECTED NOT DETECTED   Streptococcus species NOT DETECTED NOT DETECTED   Streptococcus agalactiae NOT DETECTED NOT DETECTED   Streptococcus pneumoniae NOT DETECTED NOT DETECTED   Streptococcus pyogenes NOT DETECTED NOT DETECTED   A.calcoaceticus-baumannii NOT DETECTED NOT DETECTED   Bacteroides fragilis NOT DETECTED NOT DETECTED   Enterobacterales DETECTED (A) NOT DETECTED   Enterobacter cloacae complex NOT DETECTED NOT DETECTED   Escherichia coli NOT DETECTED NOT DETECTED   Klebsiella aerogenes NOT DETECTED NOT DETECTED   Klebsiella oxytoca NOT DETECTED NOT DETECTED   Klebsiella pneumoniae NOT DETECTED NOT DETECTED   Proteus species DETECTED (A) NOT DETECTED   Salmonella species NOT DETECTED NOT DETECTED   Serratia  marcescens NOT DETECTED NOT DETECTED   Haemophilus influenzae NOT DETECTED NOT DETECTED   Neisseria meningitidis NOT DETECTED NOT DETECTED   Pseudomonas aeruginosa NOT DETECTED NOT DETECTED   Stenotrophomonas maltophilia NOT DETECTED NOT DETECTED   Candida albicans NOT DETECTED NOT DETECTED   Candida auris NOT DETECTED NOT DETECTED   Candida glabrata NOT DETECTED NOT DETECTED   Candida krusei NOT DETECTED NOT DETECTED   Candida parapsilosis NOT DETECTED NOT DETECTED   Candida tropicalis NOT DETECTED NOT DETECTED   Cryptococcus neoformans/gattii NOT DETECTED NOT DETECTED   CTX-M ESBL NOT DETECTED NOT DETECTED   Carbapenem resistance IMP NOT DETECTED NOT DETECTED   Carbapenem resistance KPC NOT DETECTED NOT DETECTED   Carbapenem resistance NDM NOT DETECTED NOT DETECTED   Carbapenem resist OXA 48 LIKE NOT DETECTED NOT DETECTED   Carbapenem resistance VIM NOT DETECTED NOT DETECTED    Thank you for allowing pharmacy to be a part of this patient's care.  Almarie Lunger, PharmD, BCPS, BCIDP Infectious Diseases Clinical Pharmacist 04/30/2024 10:47 AM   **Pharmacist phone directory can now be found on [email or web address] ([email or web address]).  Listed under [facility].

## 2024-04-30 NOTE — Progress Notes (Addendum)
 eLink Physician-Brief Progress Note Patient Name: [name] DOB: 30-Jul-1945 MRN: [id number]   Date of Service  04/30/2024  HPI/Events of Note  ELINK hand off to follow up on LA. Discussed with RN. Making urine, on levo at 2 mcg. Same not increasing.     eICU Interventions  Difficult stick for LA, tried once earlier in shift, trying again. To call back when gets reported. Continue care.      Intervention Category Intermediate Interventions: Other:  Jodelle ONEIDA Hutching 04/30/2024, 9:25 PM  22:54 LA trending down to 2.1 Continue care.

## 2024-04-30 NOTE — ED Notes (Signed)
 Clark, DO notified of lactic acid results.

## 2024-05-01 ENCOUNTER — Encounter (HOSPITAL_COMMUNITY): Payer: Self-pay | Admitting: Critical Care Medicine

## 2024-05-01 DIAGNOSIS — K5900 Constipation, unspecified: Secondary | ICD-10-CM

## 2024-05-01 DIAGNOSIS — I69351 Hemiplegia and hemiparesis following cerebral infarction affecting right dominant side: Secondary | ICD-10-CM

## 2024-05-01 DIAGNOSIS — R7881 Bacteremia: Secondary | ICD-10-CM

## 2024-05-01 DIAGNOSIS — G40909 Epilepsy, unspecified, not intractable, without status epilepticus: Secondary | ICD-10-CM

## 2024-05-01 DIAGNOSIS — Z87891 Personal history of nicotine dependence: Secondary | ICD-10-CM

## 2024-05-01 DIAGNOSIS — E119 Type 2 diabetes mellitus without complications: Secondary | ICD-10-CM

## 2024-05-01 DIAGNOSIS — A4159 Other Gram-negative sepsis: Secondary | ICD-10-CM

## 2024-05-01 DIAGNOSIS — L89609 Pressure ulcer of unspecified heel, unspecified stage: Secondary | ICD-10-CM

## 2024-05-01 LAB — CBC
HCT: 31.9 % — ABNORMAL LOW (ref 36.0–46.0)
Hemoglobin: 10.5 g/dL — ABNORMAL LOW (ref 12.0–15.0)
MCH: 27.6 pg (ref 26.0–34.0)
MCHC: 32.9 g/dL (ref 30.0–36.0)
MCV: 83.7 fL (ref 80.0–100.0)
Platelets: 111 K/uL — ABNORMAL LOW (ref 150–400)
RBC: 3.81 MIL/uL — ABNORMAL LOW (ref 3.87–5.11)
RDW: 13.3 % (ref 11.5–15.5)
WBC: 18.1 K/uL — ABNORMAL HIGH (ref 4.0–10.5)
nRBC: 0 % (ref 0.0–0.2)

## 2024-05-01 LAB — COMPREHENSIVE METABOLIC PANEL WITH GFR
ALT: 24 U/L (ref 0–44)
AST: 24 U/L (ref 15–41)
Albumin: 2.3 g/dL — ABNORMAL LOW (ref 3.5–5.0)
Alkaline Phosphatase: 67 U/L (ref 38–126)
Anion gap: 7 (ref 5–15)
BUN: 25 mg/dL — ABNORMAL HIGH (ref 8–23)
CO2: 25 mmol/L (ref 22–32)
Calcium: 8.6 mg/dL — ABNORMAL LOW (ref 8.9–10.3)
Chloride: 110 mmol/L (ref 98–111)
Creatinine, Ser: 0.99 mg/dL (ref 0.44–1.00)
GFR, Estimated: 58 mL/min — ABNORMAL LOW (ref >60)
Glucose, Bld: 105 mg/dL — ABNORMAL HIGH (ref 70–99)
Potassium: 3.5 mmol/L (ref 3.5–5.1)
Sodium: 142 mmol/L (ref 135–145)
Total Bilirubin: 0.3 mg/dL (ref 0.0–1.2)
Total Protein: 5.1 g/dL — ABNORMAL LOW (ref 6.5–8.1)

## 2024-05-01 LAB — LACTIC ACID, PLASMA: Lactic Acid, Venous: 1.4 mmol/L (ref 0.5–1.9)

## 2024-05-01 LAB — GLUCOSE, CAPILLARY
Glucose-Capillary: 63 mg/dL — ABNORMAL LOW (ref 70–99)
Glucose-Capillary: 81 mg/dL (ref 70–99)
Glucose-Capillary: 123 mg/dL — ABNORMAL HIGH (ref 70–99)
Glucose-Capillary: 172 mg/dL — ABNORMAL HIGH (ref 70–99)
Glucose-Capillary: 174 mg/dL — ABNORMAL HIGH (ref 70–99)
Glucose-Capillary: 201 mg/dL — ABNORMAL HIGH (ref 70–99)

## 2024-05-01 LAB — PHOSPHORUS: Phosphorus: 2.3 mg/dL — ABNORMAL LOW (ref 2.5–4.6)

## 2024-05-01 LAB — MAGNESIUM: Magnesium: 1.9 mg/dL (ref 1.7–2.4)

## 2024-05-01 MED ORDER — POTASSIUM & SODIUM PHOSPHATES 280-160-250 MG PO PACK
2.0000 | PACK | ORAL | Status: AC
  Administered 2024-05-01 (×3): 2 via ORAL
  Filled 2024-05-01 (×3): qty 2

## 2024-05-01 MED ORDER — SODIUM CHLORIDE 0.9 % IV SOLN
2.0000 g | Freq: Four times a day (QID) | INTRAVENOUS | Status: DC
  Administered 2024-05-01 – 2024-05-02 (×3): 2 g via INTRAVENOUS
  Filled 2024-05-01 (×5): qty 2000

## 2024-05-01 MED ORDER — INSULIN GLARGINE-YFGN 100 UNIT/ML ~~LOC~~ SOLN
8.0000 [IU] | Freq: Every day | SUBCUTANEOUS | Status: DC
  Administered 2024-05-01 – 2024-05-05 (×5): 8 [IU] via SUBCUTANEOUS
  Filled 2024-05-01 (×6): qty 0.08

## 2024-05-01 MED ORDER — SODIUM CHLORIDE 0.9 % IV SOLN
2.0000 g | INTRAVENOUS | Status: DC
  Administered 2024-05-01: 2 g via INTRAVENOUS
  Filled 2024-05-01 (×3): qty 2000

## 2024-05-01 MED ORDER — MAGNESIUM SULFATE 2 GM/50ML IV SOLN
2.0000 g | Freq: Once | INTRAVENOUS | Status: AC
  Administered 2024-05-01: 2 g via INTRAVENOUS
  Filled 2024-05-01: qty 50

## 2024-05-01 NOTE — Progress Notes (Signed)
 NAME:  [name], MRN:  [id number], DOB:  1945/08/27, LOS: 2 ADMISSION DATE:  04/29/2024, CONSULTATION DATE:  04/30/24 REFERRING MD:  Rupert CHIEF COMPLAINT: Hypotension, sepsis  History of Present Illness:  78 year old woman who presented to [facility] ED on 10/26 for abdominal pain, vomiting, and p.o. intolerance.  Patient arrived febrile and tachycardic, with leukocytosis, elevated creatinine, lactic acidosis, UA suggestive of UTI.  CTA A/P with patent vasculature, concern for cystitis and mild bilateral hydroureteronephrosis, 6 mm nonobstructing right renal calculus.  Patient was started on broad-spectrum antibiotics.  Per chart review, EDP spoke with urology, who recommended no surgical intervention at this time,  recommend treating for UTI and placing Foley catheter.  Patient initially admitted to [facility] team for urosepsis.  On 10/27 patient was noted to be hypotensive with continued elevated lactic acid despite fluid resuscitation so CCM was consulted.  Patient was started on Levophed, has been stable off pressors this morning.  Per patient's son, patient mostly bedbound at home and typically can identify who he is, does not usually know the date and time.  Lines: Urethral catheter, PIV x 2  Pertinent  Medical History   Past Medical History:  Diagnosis Date   Acute blood loss anemia    CHF (congestive heart failure) (HCC)    EF 50-55%, grade 1 diastolic dysfunction per echo 89/7983   Coronary artery disease involving native artery of transplanted heart without angina pectoris    CVA (cerebral infarction) 04/2015   Started Plavix  04/2015   Depression    Diabetes (HCC) 2016   Type II. On insulin    Enchondroma of bone 2011   left femur.    Fracture, intertrochanteric, right femur (HCC)    History of lower GI bleeding    Hyperlipidemia    Hypertension    Patent foramen ovale 04/2015   Started on warfarin 04/2015   Protein calorie malnutrition    Stercoral ulcer of rectum  05/16/2015   Significant Hospital Events: Including procedures, antibiotic start and stop dates in addition to other pertinent events   10/26: Admitted to [facility] for urosepsis. 10/27: Patient hypotensive, transferred to ICU, started on pressors.  Interim History / Subjective:  Per patient's son, patient mostly bedbound at home and typically can identify who he is, does not usually know the date and time.  Normally he urinates without issue.  Has had 4-5 UTIs in past 9 years, only 1 year this past year.   Patient afebrile overnight.  BP 80-110/50-60 with MAPs >65.  Satting well on room air.  I/O in last 24h: 2.18 L urine 1 x stool (medium size)  Labs: WBC: 23 > 18 Hgb: 11.6 > 10.5 Creatinine: 0.9 > 0.99 Magnesium  1.9 Phosphorus 2.3  Micro: Urine culture 10/26: Proteus mirabilis & E faecalis sensitive to ampicillin  Blood culture 10/26: Proteus, Enterobacterales  Objective    Blood pressure (!) 98/57, pulse 88, temperature 99.1 F (37.3 C), temperature source Axillary, resp. rate (!) 29, height 5' 7 (1.702 m), weight 74.7 kg, SpO2 97%.        Intake/Output Summary (Last 24 hours) at 05/01/2024 1049 Last data filed at 05/01/2024 0950 Gross per 24 hour  Intake 3120.27 ml  Output 2181 ml  Net 939.27 ml   Filed Weights   04/30/24 0000 04/30/24 1813  Weight: 67.6 kg 74.7 kg    Examination: General: No acute distress.  Alert. Lungs: Breathing comfortably on room air.  No respiratory distress. Cardiovascular: S1/S2.  No extra heart sounds. Abdomen: Normoactive  bowel sounds.  Distractable tenderness to palpation of left upper quadrant.  Abdomen soft, not distended overall. Extremities: Bilateral foot drop noted.  Neuro: Alert. Interactive. Oriented to place.    Resolved problem list  #AKI  Assessment and Plan   #Urosepsis #Hypotension Patient now stable off pressors.  Urine culture with Proteus mirabilis & E faecalis, sensitive to ampicillin .  Concern for urinary  retention 2/2 constipation versus possible neurogenic bladder. - Continue ampicillin  2g Q4 - Spoke with urology who recommended the following: Bilateral hydronephrosis may be secondary to constipation with noted stool burden on initial imaging.  Recommend treating constipation first, may trial Foley removal thereafter with bladder scans.  No retention medications at this time. Nonobstructing stone is not actionable.  Urodynamic studies are only available in office, may be challenging given patient's limited mobility. - Constipation treatment as below -Trend kidney function and replete electrolytes as indicated - If patient remains hemodynamically stable off pressors, may be appropriate to transfer back to floor this afternoon  #Constipation Notable stool burden on CT A/P. S/p 1 medium sized stool.  - Continue MiraLAX  and senna - CTM bowel movements - Consider Foley trial when appropriate  #T2DM Blood sugar stable overnight.  Required 1 unit SAI in addition to 10 units LAI yesterday.  - Continue home 10 units LAI daily - Very sensitive SSI qACHS  #Hx CVA, R hemiparesis - Continue DOAC, atorvastatin  - PT eval/treat  #Seizure disorder - Continue home Keppra   #Chronic pressure ulcer of heel - Wound care  VTE prophylaxis: Eliquis    Labs   CBC: Recent Labs  Lab 04/29/24 1552 04/30/24 0442 05/01/24 0503  WBC 23.8* 23.3* 18.1*  NEUTROABS 21.6*  --   --   HGB 14.2 11.6* 10.5*  HCT 45.3 36.6 31.9*  MCV 87.5 86.7 83.7  PLT 190 135* 111*    Basic Metabolic Panel: Recent Labs  Lab 04/29/24 1552 04/30/24 0318 04/30/24 0442 04/30/24 1510 05/01/24 0503  NA 136 137 139 138 142  K 4.5 4.4 4.5 4.0 3.5  CL 102 103 105 106 110  CO2 16* 22 22 20* 25  GLUCOSE 305* 216* 211* 153* 105*  BUN 35* 30* 30* 23 25*  CREATININE 1.54* 1.20* 1.21* 0.90 0.99  CALCIUM  9.7 8.8* 8.9 7.5* 8.6*  MG  --   --  1.5* 1.5* 1.9  PHOS  --   --  2.4* 2.0* 2.3*   GFR: Estimated Creatinine Clearance:  48.6 mL/min (by C-G formula based on SCr of 0.99 mg/dL). Recent Labs  Lab 04/29/24 1552 04/29/24 1634 04/30/24 0442 04/30/24 1440 04/30/24 1713 04/30/24 2145 04/30/24 2352 05/01/24 0503  WBC 23.8*  --  23.3*  --   --   --   --  18.1*  LATICACIDVEN  --    < > 4.5* 3.0* 3.4* 2.1* 1.4  --    < > = values in this interval not displayed.    Liver Function Tests: Recent Labs  Lab 04/29/24 1552 05/01/24 0503  AST 39 24  ALT 28 24  ALKPHOS 97 67  BILITOT 0.7 0.3  PROT 7.1 5.1*  ALBUMIN 3.5 2.3*   No results for input(s): LIPASE, AMYLASE in the last 168 hours. No results for input(s): AMMONIA in the last 168 hours.  ABG    Component Value Date/Time   PHART 7.415 05/16/2015 0319   PCO2ART 32.9 (L) 05/16/2015 0319   PO2ART 118 (H) 05/16/2015 0319   HCO3 12.6 (L) 04/30/2024 0115   TCO2 23 12/03/2017 0825  ACIDBASEDEF 11.9 (H) 04/30/2024 0115   O2SAT 62.9 04/30/2024 0115     Coagulation Profile: No results for input(s): INR, PROTIME in the last 168 hours.  Cardiac Enzymes: No results for input(s): CKTOTAL, CKMB, CKMBINDEX, TROPONINI in the last 168 hours.  HbA1C: Hgb A1c MFr Bld  Date/Time Value Ref Range Status  01/23/2024 04:54 AM 8.7 (H) 4.8 - 5.6 % Final    Comment:    (NOTE) Diagnosis of Diabetes The following HbA1c ranges recommended by the American Diabetes Association (ADA) may be used as an aid in the diagnosis of diabetes mellitus.  Hemoglobin             Suggested A1C NGSP%              Diagnosis  <5.7                   Non Diabetic  5.7-6.4                Pre-Diabetic  >6.4                   Diabetic  <7.0                   Glycemic control for                       adults with diabetes.    10/13/2022 11:06 PM 6.2 (H) 4.8 - 5.6 % Final    Comment:    (NOTE) Pre diabetes:          5.7%-6.4%  Diabetes:              >6.4%  Glycemic control for   <7.0% adults with diabetes     CBG: Recent Labs  Lab 04/30/24 1819  04/30/24 2103 04/30/24 2340 05/01/24 0818 05/01/24 0857  GLUCAP 118* 146* 122* 63* 81    Past Medical History:  She,  has a past medical history of Acute blood loss anemia, CHF (congestive heart failure) (HCC), Coronary artery disease involving native artery of transplanted heart without angina pectoris, CVA (cerebral infarction) (04/2015), Depression, Diabetes (HCC) (2016), Enchondroma of bone (2011), Fracture, intertrochanteric, right femur (HCC), History of lower GI bleeding, Hyperlipidemia, Hypertension, Patent foramen ovale (04/2015), Protein calorie malnutrition, and Stercoral ulcer of rectum (05/16/2015).   Surgical History:   Past Surgical History:  Procedure Laterality Date   CARDIAC SURGERY     Cath without stent   COLONOSCOPY N/A 05/15/2015   Procedure: COLONOSCOPY;  Surgeon: Gustav Shila GAILS, MD;  Location: [facility] ENDOSCOPY;  Service: Endoscopy;  Laterality: N/A;   FLEXIBLE SIGMOIDOSCOPY N/A 05/15/2015   Procedure: FLEXIBLE SIGMOIDOSCOPY;  Surgeon: Gustav Shila GAILS, MD;  Location: [facility] ENDOSCOPY;  Service: Endoscopy;  Laterality: N/A;  at bedside   INTRAMEDULLARY (IM) NAIL INTERTROCHANTERIC Right 06/12/2015   Procedure: INTRAMEDULLARY (IM) NAIL RIGHT HIP;  Surgeon: Evalene JONETTA Chancy, MD;  Location: [facility] OR;  Service: Orthopedics;  Laterality: Right;   TEE WITHOUT CARDIOVERSION N/A 05/05/2015   Procedure: TRANSESOPHAGEAL ECHOCARDIOGRAM (TEE);  Surgeon: Salena Negri, MD;  Location: [facility] ENDOSCOPY;  Service: Cardiovascular;  Laterality: N/A;     Social History:   reports that she quit smoking about 9 years ago. Her smoking use included cigarettes. She has never used smokeless tobacco. She reports that she does not drink alcohol and does not use drugs.   Family History:  Her family history includes Heart attack in her father; Heart failure in her mother; Hyperlipidemia  in her mother; Hypertension in her mother.   Allergies Allergies  Allergen Reactions   Porcine (Pork)  Protein-Containing Drug Products Other (See Comments)    To keep blood pressure down     Home Medications  Prior to Admission medications   Medication Sig Start Date End Date Taking? Authorizing Provider  acetaminophen  (TYLENOL ) 325 MG tablet Take 2 tablets (650 mg total) by mouth every 6 (six) hours as needed. 03/28/24  Yes Dean Clarity, MD  atorvastatin  (LIPITOR) 20 MG tablet Take 1 tablet (20 mg total) by mouth daily. 03/28/24  Yes Dean Clarity, MD  ELIQUIS  5 MG TABS tablet Take 1 tablet (5 mg total) by mouth every 12 (twelve) hours. 03/28/24  Yes Dean Clarity, MD  gentamicin cream (GARAMYCIN) 0.1 % Apply 1 Application topically daily. 04/04/24  Yes [provider]  levETIRAcetam  (KEPPRA ) 500 MG tablet Take 1 tablet (500 mg total) by mouth every 12 (twelve) hours. 03/28/24  Yes Dean Clarity, MD  losartan  (COZAAR ) 25 MG tablet Take 0.5 tablets (12.5 mg total) by mouth daily. 03/28/24  Yes Dean Clarity, MD  metoprolol  succinate (TOPROL -XL) 25 MG 24 hr tablet Take 1 tablet (25 mg total) by mouth daily. 03/28/24  Yes Haviland, Julie, MD  polyethylene glycol (MIRALAX  / GLYCOLAX ) 17 g packet Take 17 g by mouth daily as needed for mild constipation or moderate constipation. Use Miralax  if Milk of Magnesia is contraindicated (Example: Dialysis resident) 03/28/24  Yes Dean Clarity, MD  TRESIBA  FLEXTOUCH 100 UNIT/ML FlexTouch Pen Inject 10 Units into the skin at bedtime. 03/29/24  Yes Towana Ozell BROCKS, MD  diclofenac  Sodium (VOLTAREN ) 1 % GEL Apply 4 g topically 4 (four) times daily. Patient not taking: Reported on 04/29/2024 03/28/24   Dean Clarity, MD

## 2024-05-01 NOTE — Evaluation (Signed)
 Occupational Therapy Evaluation Patient Details Name: [name] MRN: [id number] DOB: 1946/04/20 Today's Date: 05/01/2024   History of Present Illness   Pt is a 78 y/o F admitted on 04/29/24 for sudden onset LLQ abdominal pain and vomiting. She received vanc, zosyn , 2L LR bolus for diagnosis of sepsis. Initial lactic acid 12. Lactic acid has been downtrending. Admitted to ICU for initiation of vasopressors. PMHx: HTN, CHF, CVA with R sided deficits, seizures, HLD, RLE pressure wound.     Clinical Impressions Pt received in supine, agreeable for OT visit. AOX2, re-oriented appropriately. She presents with residual RHB weakness and RUE contracture from prior CVA. All extremities painful to touch, especially RHB. Required total A for all LB/UB ADLs, max A for self-feeding. Demo's poor volition. Per Crit care notes, pt bedbound with son providing care to patient. Pt is at her functional baseline and does not warrant any further acute OT needs, will sign-off.     If plan is discharge home, recommend the following:   Two people to help with walking and/or transfers;Two people to help with bathing/dressing/bathroom;Assistance with cooking/housework;Assistance with feeding;Direct supervision/assist for medications management;Direct supervision/assist for financial management;Assist for transportation;Help with stairs or ramp for entrance;Supervision due to cognitive status     Functional Status Assessment         Equipment Recommendations   None recommended by OT (pt has adequate DME)     Recommendations for Other Services         Precautions/Restrictions   Precautions Precautions: Fall Precaution/Restrictions Comments: delirium prevention Restrictions Weight Bearing Restrictions Per Provider Order: No     Mobility Bed Mobility               General bed mobility comments: deferred, pt declining to move about the bed, needs max A for trunk and shoulder  repositioning in supine    Transfers                   General transfer comment: NT      Balance                                           ADL either performed or assessed with clinical judgement   ADL Overall ADL's : Needs assistance/impaired Eating/Feeding: Maximal assistance   Grooming: Total assistance   Upper Body Bathing: Total assistance   Lower Body Bathing: Total assistance   Upper Body Dressing : Total assistance   Lower Body Dressing: Total assistance   Toilet Transfer: Total assistance   Toileting- Clothing Manipulation and Hygiene: Total assistance               Vision   Vision Assessment?:  (NT)     Perception         Praxis         Pertinent Vitals/Pain Pain Assessment Pain Assessment: Faces Faces Pain Scale: Hurts even more Pain Location: BLE, R hand/UE Pain Descriptors / Indicators: Discomfort, Grimacing, Guarding Pain Intervention(s): Repositioned, Monitored during session     Extremity/Trunk Assessment Upper Extremity Assessment Upper Extremity Assessment: Generalized weakness;RUE deficits/detail RUE Deficits / Details: RUE contracted from prior CVA   Lower Extremity Assessment Lower Extremity Assessment: Defer to PT evaluation       Communication Communication Communication: Impaired Factors Affecting Communication: Difficulty expressing self;Reduced clarity of speech   Cognition Arousal: Alert Behavior During Therapy: Flat affect Cognition: History of  cognitive impairments (per chart)             OT - Cognition Comments: responding yes to all questions; little volition                 Following commands: Impaired Following commands impaired: Follows one step commands with increased time     Cueing  General Comments   Cueing Techniques: Verbal cues  VSS on RA, off pressors; R heel wrapped due to known pressure injury   Exercises     Shoulder Instructions      Home  Living Family/patient expects to be discharged to:: Private residence Living Arrangements: Children Available Help at Discharge: Family Type of Home: House Home Access: Stairs to enter Secretary/administrator of Steps: 3 Entrance Stairs-Rails: Right Home Layout: One level     Bathroom Shower/Tub: Chief Strategy Officer: Standard Bathroom Accessibility: No   Home Equipment: Hospital bed;Other (comment);Wheelchair - manual (mechanical lift)   Additional Comments: info taken from prior hospitalization in July 2025      Prior Functioning/Environment Prior Level of Function : Needs assist  Cognitive Assist : Mobility (cognitive);ADLs (cognitive)     Physical Assist : Mobility (physical);ADLs (physical)   ADLs (physical): Bathing;Dressing;IADLs;Toileting;Grooming        OT Problem List: Decreased strength;Decreased range of motion;Decreased activity tolerance;Impaired balance (sitting and/or standing);Decreased cognition;Decreased safety awareness;Obesity;Impaired UE functional use;Pain;Increased edema   OT Treatment/Interventions:        OT Goals(Current goals can be found in the care plan section)   Acute Rehab OT Goals Patient Stated Goal: pt did not state goal   OT Frequency:       Co-evaluation PT/OT/SLP Co-Evaluation/Treatment: Yes Reason for Co-Treatment: Necessary to address cognition/behavior during functional activity PT goals addressed during session: Mobility/safety with mobility OT goals addressed during session: ADL's and self-care      AM-PAC OT 6 Clicks Daily Activity     Outcome Measure Help from another person eating meals?: A Lot Help from another person taking care of personal grooming?: Total Help from another person toileting, which includes using toliet, bedpan, or urinal?: Total Help from another person bathing (including washing, rinsing, drying)?: Total Help from another person to put on and taking off regular upper body  clothing?: Total Help from another person to put on and taking off regular lower body clothing?: Total 6 Click Score: 7   End of Session    Activity Tolerance: Patient limited by pain Patient left: in bed;with call bell/phone within reach;with bed alarm set  OT Visit Diagnosis: Unsteadiness on feet (R26.81);Hemiplegia and hemiparesis;Pain Hemiplegia - Right/Left: Right Hemiplegia - caused by: Cerebral infarction Pain - Right/Left: Right Pain - part of body: Leg;Ankle and joints of foot;Hand;Arm                Time: 8566-8542 OT Time Calculation (min): 24 min Charges:  OT General Charges $OT Visit: 1 Visit OT Evaluation $OT Eval Low Complexity: 1 Low  Rikki CORDOBA MSOT, OTR/L Acute Rehabilitation Services [phone] Secure Chat Preferred  Rikki Milch 05/01/2024, 4:59 PM

## 2024-05-01 NOTE — Progress Notes (Signed)
 Pharmacy Electrolyte Replacement  Recent Labs:  Recent Labs    05/01/24 0503  K 3.5  MG 1.9  PHOS 2.3*  CREATININE 0.99    Low Critical Values (K </= 2.5, Phos </= 1, Mg </= 1) Present: None  MD Contacted: N/a, per protocol   Plan:  Give Phos-Nak 2 packets PO q4h x 3 doses.  Give Mag sulfate 2g IV x 1 dose.   Re-evaluate replacements needs with tomorrow morning labs.

## 2024-05-01 NOTE — Plan of Care (Signed)
  Problem: Fluid Volume: Goal: Ability to maintain a balanced intake and output will improve Outcome: Progressing   Problem: Metabolic: Goal: Ability to maintain appropriate glucose levels will improve Outcome: Progressing   Problem: Nutritional: Goal: Maintenance of adequate nutrition will improve Outcome: Progressing   Problem: Skin Integrity: Goal: Risk for impaired skin integrity will decrease Outcome: Progressing   Problem: Tissue Perfusion: Goal: Adequacy of tissue perfusion will improve Outcome: Progressing   Problem: Clinical Measurements: Goal: Diagnostic test results will improve Outcome: Progressing   Problem: Coping: Goal: Level of anxiety will decrease Outcome: Progressing   Problem: Pain Managment: Goal: General experience of comfort will improve and/or be controlled Outcome: Progressing   Problem: Safety: Goal: Ability to remain free from injury will improve Outcome: Progressing   Problem: Skin Integrity: Goal: Risk for impaired skin integrity will decrease Outcome: Progressing

## 2024-05-01 NOTE — Progress Notes (Signed)
 Hypoglycemic Event  CBG: 63  Treatment: Breakfast tray  Symptoms: None  Follow-up CBG: Time:0818 CBG Result:81  Possible Reasons for Event: Inadequate meal intake  Comments/MD notified:hypoglycemic protocol followed    [name] F

## 2024-05-01 NOTE — Inpatient Diabetes Management (Signed)
 Inpatient Diabetes Program Recommendations  AACE/ADA: New Consensus Statement on Inpatient Glycemic Control (2015)  Target Ranges:  Prepandial:   less than 140 mg/dL      Peak postprandial:   less than 180 mg/dL (1-2 hours)      Critically ill patients:  140 - 180 mg/dL   Lab Results  Component Value Date   GLUCAP 123 (H) 05/01/2024   HGBA1C 8.7 (H) 01/23/2024    Review of Glycemic Control  Latest Reference Range & Units 04/30/24 21:03 04/30/24 23:40 05/01/24 08:18 05/01/24 08:57 05/01/24 11:20  Glucose-Capillary 70 - 99 mg/dL 853 (H) 877 (H) 63 (L) 81 123 (H)   Diabetes history: DM 2 Outpatient Diabetes medications:  Tresiba  10 units daily Current orders for Inpatient glycemic control:  Semglee  10 units daily Novolog  0-6 units tid with meals and HS  Inpatient Diabetes Program Recommendations:    Note low CBG this AM. May consider reducing Semglee  to 8 units daily.   Thanks,  Randall Bullocks, RN, BC-ADM Inpatient Diabetes Coordinator Pager [phone]  (8a-5p)

## 2024-05-01 NOTE — Progress Notes (Signed)
 This nurse receives patient to floor and completes as much admission as time will allow. Patient is alert to self and situation. Patient arrives safely in hospital bed. Antibiotics are infusion in LFAPIV, PIV sites appear dry and clean. Foley catheter in place, titled, flowing and positioned below bladder. Patient is clean and dry. Wound dressing to RLE, per report to remain until tomorrow. Patient is able to answer several admission questions but this RN recommends follow up with Son, [name], whom patient states is her legal guardian and POA.   Patient endorses pain to her head, general abd and R arm. States she in incontinent of stool.  Upon brief visual assessment patient is contracted on R arm. Non-pitting edema to bilateral upper extremities, 3+ pitting edema to bilateral lower extremities. Patient skin in warm to touch, low grade fever and patient states she is cold. Heart sounds are faint and bowel sounds are loud, hyperactive and hear throughout chest. Bilateral upper lobes are clear but diminished and patient's breathing is shallow.   Report given to incoming RN, Tsehainesh. Please refer to incoming RN's full body assessment and educational assessment.    Melissa Noell, RN

## 2024-05-01 NOTE — Evaluation (Signed)
 Physical Therapy Brief Evaluation and Discharge Note Patient Details Name: [name] MRN: [id number] DOB: 01/07/1946 Today's Date: 05/01/2024   History of Present Illness  Pt is a 78 y/o F admitted on 04/29/24 for sudden onset LLQ abdominal pain and vomiting. She received vanc, zosyn , 2L LR bolus for diagnosis of sepsis. Initial lactic acid 12. Lactic acid has been downtrending. Admitted to ICU for initiation of vasopressors. PMHx: HTN, CHF, CVA with R sided deficits, seizures, HLD, RLE pressure wound.  Clinical Impression  Per MD notes when talking with pt's son, pt is bed bound at baseline, family assist with ADL, and iADL. Family has hoyer lift, hospital bed and wheelchair, as of hospitalization 01/2024. Pt is painful to all PROM of LE, attempted to place bed in chair position and pt cried out due to pain in her LE. Pt is at her baseline level of function. PT recommends pt return home to her family. PT signing off.        PT Assessment Patient does not need any further PT services  Assistance Needed at Discharge  Frequent or constant Supervision/Assistance    Equipment Recommendations None recommended by PT     Precautions/Restrictions Precautions Precautions: Fall Restrictions Weight Bearing Restrictions Per Provider Order: Yes        Mobility  Bed Mobility       General bed mobility comments: pt requiring total A x2 when staff turning for bathing              Pertinent Vitals/Pain PT - Brief Vital Signs All Vital Signs Stable: Yes Pain Assessment Facial Expression: Tense Body Movements: Protection Muscle Tension: Tense, rigid Vocalization (extubated pts.): Sighing, moaning     Home Living Family/patient expects to be discharged to:: Private residence Living Arrangements: Children Available Help at Discharge: Family     Home Equipment: Hospital bed;Other (comment);Wheelchair - manual (Lift)   Additional Comments: per notes in 7/25    Prior  Function Level of Independence: Needs assistance Comments: per notes pt is bedbound and son provides all care    UE/LE Assessment   UE ROM/Strength/Tone/Coordination: Impaired UE ROM/Strength/Tone/Coordination Deficits: deferred to OT  LE ROM/Strength/Tone/Coordination: Impaired LE ROM/Strength/Tone/Coordination Deficits: B LE ROM limited R>L due to hemiparesis on R and contracture on L, no volitional movement of either LE noted on eval    Communication   Communication Communication: Impaired Factors Affecting Communication: Difficulty expressing self;Reduced clarity of speech     Cognition Overall Cognitive Status: History of cognitive impairments - at baseline       General Comments General comments (skin integrity, edema, etc.): VSS on RA, L ankle and heel wrapped due to pressure injury        Assessment/Plan       PT Visit Diagnosis Muscle weakness (generalized) (M62.81);Hemiplegia and hemiparesis;Adult, failure to thrive (R62.7)    No Skilled PT Patient at baseline level of functioning;Patient will have necessary level of assist by caregiver at discharge;Patient unable to participate in therapy   Co-evaluation PT/OT/SLP Co-Evaluation/Treatment: Yes Reason for Co-Treatment: Necessary to address cognition/behavior during functional activity PT goals addressed during session: Mobility/safety with mobility          AMPAC 6 Clicks Help needed turning from your back to your side while in a flat bed without using bedrails?: Total Help needed moving from lying on your back to sitting on the side of a flat bed without using bedrails?: Total Help needed moving to and from a bed to a chair (including a  wheelchair)?: Total Help needed standing up from a chair using your arms (e.g., wheelchair or bedside chair)?: Total Help needed to walk in hospital room?: Total Help needed climbing 3-5 steps with a railing? : Total 6 Click Score: 6      End of Session   Activity  Tolerance: Patient limited by pain (pt with painful response to PROM especially on R) Patient left: in bed;with bed alarm set;with call bell/phone within reach Nurse Communication: Mobility status PT Visit Diagnosis: Muscle weakness (generalized) (M62.81);Hemiplegia and hemiparesis;Adult, failure to thrive (R62.7) Hemiplegia - Right/Left: Right Hemiplegia - dominant/non-dominant: Dominant Hemiplegia - caused by: Cerebral infarction     Time: 1435-1455 PT Time Calculation (min) (ACUTE ONLY): 20 min  Charges:   PT Evaluation $PT Eval Low Complexity: 1 Low      Molina Hollenback B. Fleeta Lapidus PT, DPT Acute Rehabilitation Services Please use secure chat or  Call Office [phone]   Almarie KATHEE Fleeta [facility]  05/01/2024, 3:35 PM

## 2024-05-02 ENCOUNTER — Inpatient Hospital Stay (HOSPITAL_COMMUNITY)

## 2024-05-02 ENCOUNTER — Encounter (HOSPITAL_BASED_OUTPATIENT_CLINIC_OR_DEPARTMENT_OTHER): Admitting: Internal Medicine

## 2024-05-02 DIAGNOSIS — A419 Sepsis, unspecified organism: Secondary | ICD-10-CM | POA: Diagnosis not present

## 2024-05-02 DIAGNOSIS — N390 Urinary tract infection, site not specified: Secondary | ICD-10-CM | POA: Diagnosis not present

## 2024-05-02 LAB — RENAL FUNCTION PANEL
Albumin: 2.2 g/dL — ABNORMAL LOW (ref 3.5–5.0)
Anion gap: 11 (ref 5–15)
BUN: 21 mg/dL (ref 8–23)
CO2: 21 mmol/L — ABNORMAL LOW (ref 22–32)
Calcium: 8.4 mg/dL — ABNORMAL LOW (ref 8.9–10.3)
Chloride: 108 mmol/L (ref 98–111)
Creatinine, Ser: 0.93 mg/dL (ref 0.44–1.00)
GFR, Estimated: >60 mL/min (ref >60)
Glucose, Bld: 131 mg/dL — ABNORMAL HIGH (ref 70–99)
Phosphorus: 3.2 mg/dL (ref 2.5–4.6)
Potassium: 3.8 mmol/L (ref 3.5–5.1)
Sodium: 140 mmol/L (ref 135–145)

## 2024-05-02 LAB — URINE CULTURE: Culture: 20000 — AB

## 2024-05-02 LAB — GLUCOSE, CAPILLARY
Glucose-Capillary: 105 mg/dL — ABNORMAL HIGH (ref 70–99)
Glucose-Capillary: 187 mg/dL — ABNORMAL HIGH (ref 70–99)
Glucose-Capillary: 187 mg/dL — ABNORMAL HIGH (ref 70–99)

## 2024-05-02 LAB — CULTURE, BLOOD (ROUTINE X 2): Special Requests: ADEQUATE

## 2024-05-02 LAB — MAGNESIUM: Magnesium: 2.1 mg/dL (ref 1.7–2.4)

## 2024-05-02 MED ORDER — FLEET ENEMA RE ENEM
1.0000 | ENEMA | Freq: Once | RECTAL | Status: DC
  Filled 2024-05-02: qty 1

## 2024-05-02 MED ORDER — AMMONIUM LACTATE 12 % EX LOTN
TOPICAL_LOTION | Freq: Two times a day (BID) | CUTANEOUS | Status: DC
  Administered 2024-05-03: 1 via TOPICAL
  Filled 2024-05-02 (×2): qty 225

## 2024-05-02 MED ORDER — SODIUM CHLORIDE 0.9 % IV SOLN
2.0000 g | INTRAVENOUS | Status: DC
  Administered 2024-05-02 – 2024-05-04 (×11): 2 g via INTRAVENOUS
  Filled 2024-05-02 (×14): qty 2000

## 2024-05-02 NOTE — Progress Notes (Addendum)
 CSW called pt's son [name] to request documentation stating that the pt has a legal guardian. CSW left a VM for Melissa to return CSW's call.  1021 -CSW received a phone call from Melissa and Melissa stated that his is the pt's HCPOA and makes financial decisions for the pt. Melissa stated that he is not the legal guardian and has not went through [facility] Courts/Magistrate to do so.  CSW will continue to monitor.

## 2024-05-02 NOTE — Progress Notes (Signed)
 PHARMACY NOTE:  ANTIMICROBIAL RENAL DOSAGE ADJUSTMENT  Current antimicrobial regimen includes a mismatch between antimicrobial dosage and estimated renal function.  As per policy approved by the Pharmacy & Therapeutics and Medical Executive Committees, the antimicrobial dosage will be adjusted accordingly.  Current antimicrobial dosage:  Ampicillin  2g IV every 6 hours  Indication: Proteus bacteremia and proteus + VRE UTI  Renal Function:  Estimated Creatinine Clearance: 52.3 mL/min (by C-G formula based on SCr of 0.93 mg/dL). []      On intermittent HD, scheduled: []      On CRRT    Antimicrobial dosage has been changed to: Ampicillin  2g IV every 4 hours  Additional comments:   Thank you for allowing pharmacy to be a part of this patient's care.  Almarie Lunger, PharmD, BCPS, BCIDP Infectious Diseases Clinical Pharmacist 05/02/2024 12:02 PM   **Pharmacist phone directory can now be found on [email or web address] ([email or web address]).  Listed under [facility].

## 2024-05-02 NOTE — Consult Note (Signed)
 WOC Nurse wound follow up CG requesting WOC do dressing Wound type: old PI to RLE Achilles region Measurement: wide area of involvement, approx 5x4 cm Wound bed: red, pink  Drainage (amount, consistency, odor) none Periwound:scar tissue, induration Dressing procedure/placement/frequency: MWF  Cleanse R lower leg wound with NS, apply antibiotic ointment to wound beds (3) then cover with collagen pieces cut to fit wound beds. Cover with silicone foam dressing and secure with Kerlix roll gauze beginning right above toes and ending above wound, then wrap with coban, netting.   Do not recommend ACE elastic bandage due to inconsistency of pressure wrapping around leg by clinician.  Will add ammonium lactate (LAC-HYDRIN) 12 % lotion to BLE for xerosis  RLE 05/02/24    WOC will follow for dressing change. Please reconsult if wound worsens in condition and notify provider.   Sherrilyn Hals MSN RN CWOCN WOC [facility]  [phone] (Available from 7-3 pm Mon-Friday)

## 2024-05-02 NOTE — Evaluation (Signed)
 Speech Language Pathology Evaluation Patient Details Name: [name] MRN: [id number] DOB: [birth year] Today's Date: 05/02/2024 Time: 9055-8999 SLP Time Calculation (min) (ACUTE ONLY): 16 min  Problem List:  Patient Active Problem List   Diagnosis Date Noted   Bacteremia due to Proteus species 05/01/2024   Elevated troponin 04/30/2024   Elevated lactic acid level 04/30/2024   Nausea and vomiting 04/30/2024   Pleural effusion 04/30/2024   Wound of right leg 04/30/2024   Thrombocytopenia 04/30/2024   Lactic acidosis 04/30/2024   Septic shock (HCC) 04/30/2024   Pressure injury of skin 04/30/2024   Age-related cataract of both eyes 04/23/2024   Urinary tract infection due to Pseudomonas aeruginosa 01/25/2024   Sepsis secondary to UTI (HCC) 01/22/2024   Acute urinary retention 01/22/2024   Chronic diastolic (congestive) heart failure (HCC) 07/20/2023   Paroxysmal atrial fibrillation (HCC) 07/20/2023   Hemiplegia and hemiparesis following unspecified cerebrovascular disease affecting right dominant side (HCC) 10/21/2022   Localized edema 10/21/2022   Long term (current) use of insulin  (HCC) 10/21/2022   Muscle weakness (generalized) 10/21/2022   Hypokalemia 10/18/2022   Right knee pain 10/13/2022   Hypoglycemia 03/28/2022   Acute metabolic encephalopathy 03/28/2022   UTI (urinary tract infection) 03/28/2022   Encounter for well adult exam with abnormal findings 07/27/2021   HLD (hyperlipidemia) 04/14/2021   Vitamin D  deficiency 04/14/2021   B12 deficiency 04/14/2021   Rupture of left biceps tendon 06/19/2020   Goals of care, counseling/discussion    Palliative care by specialist    DNR (do not resuscitate) discussion    Sepsis without acute organ dysfunction (HCC) 04/05/2020   AKI (acute kidney injury) 04/05/2020   Hyperkalemia 04/05/2020   Abnormal LFTs 04/05/2020   Mild protein malnutrition 04/05/2020   Grade I diastolic dysfunction 04/05/2020   Closed fracture of  right distal femur (HCC) 04/05/2020   Prolonged QT interval 04/05/2020   Dysuria 06/12/2019   Preventative health care 08/18/2018   Partial symptomatic epilepsy with complex partial seizures, not intractable, without status epilepticus (HCC) 12/08/2017   Shingles 10/26/2017   Constipation 04/20/2017   Skin ulcer of right heel, limited to breakdown of skin (HCC) 02/10/2017   Muscle spasticity 01/20/2016   Bilateral lower extremity edema 12/23/2015   Acute confusional state 10/28/2015   Right spastic hemiparesis (HCC) 10/07/2015   Right shoulder pain 07/24/2015   Fracture, intertrochanteric, right femur (HCC) 06/10/2015   Stercoral ulcer of rectum 05/16/2015   GI bleed 05/13/2015   BRBPR (bright red blood per rectum) 05/13/2015   CHF (congestive heart failure) (HCC)    Type 2 diabetes mellitus (HCC)    Hypertension    History of stroke    Lower GI bleed    Left-sided cerebrovascular accident (CVA) (HCC) 04/28/2015   Past Medical History:  Past Medical History:  Diagnosis Date   Acute blood loss anemia    CHF (congestive heart failure) (HCC)    EF 50-55%, grade 1 diastolic dysfunction per echo 89/7983   Coronary artery disease involving native artery of transplanted heart without angina pectoris    CVA (cerebral infarction) 04/2015   Started Plavix  04/2015   Depression    Diabetes (HCC) 2016   Type II. On insulin    Enchondroma of bone 2011   left femur.    Fracture, intertrochanteric, right femur (HCC)    History of lower GI bleeding    Hyperlipidemia    Hypertension    Patent foramen ovale 04/2015   Started on warfarin 04/2015   Protein  calorie malnutrition    Stercoral ulcer of rectum 05/16/2015   Past Surgical History:  Past Surgical History:  Procedure Laterality Date   CARDIAC SURGERY     Cath without stent   COLONOSCOPY N/A 05/15/2015   Procedure: COLONOSCOPY;  Surgeon: Gustav Shila GAILS, MD;  Location: [facility] ENDOSCOPY;  Service: Endoscopy;  Laterality: N/A;    FLEXIBLE SIGMOIDOSCOPY N/A 05/15/2015   Procedure: FLEXIBLE SIGMOIDOSCOPY;  Surgeon: Gustav Shila GAILS, MD;  Location: [facility] ENDOSCOPY;  Service: Endoscopy;  Laterality: N/A;  at bedside   INTRAMEDULLARY (IM) NAIL INTERTROCHANTERIC Right 06/12/2015   Procedure: INTRAMEDULLARY (IM) NAIL RIGHT HIP;  Surgeon: Evalene JONETTA Chancy, MD;  Location: [facility] OR;  Service: Orthopedics;  Laterality: Right;   TEE WITHOUT CARDIOVERSION N/A 05/05/2015   Procedure: TRANSESOPHAGEAL ECHOCARDIOGRAM (TEE);  Surgeon: Salena Negri, MD;  Location: [facility] ENDOSCOPY;  Service: Cardiovascular;  Laterality: N/A;   HPI:  Patient is a 78 y.o. female with hx of dementia, CVA, right hemiparesis, HFpEF, HTN, HLD, DM2, seizure disorder with complex partial seizures, chronic pressure ulcer heel, who presented wtih lower abd pain on 04/29/24. History is obtained from her son. Cognitive/language evaluation ordered to assess patient's cognition.   Assessment / Plan / Recommendation Clinical Impression  Patient was alert, awake, and finishing up with nurse upon SLP arrival. Patient's son was at bedside. Her son was able to provide more information on patient's history and baseline. Son shared that this past week patient has been, out of it. He shared that he is starting to see improvement this morning in patient's cognition and mental status. Patient lives with son and is able to clearly communicate her wants and needs. Patient's son shared that her communication has improved since her stroke in 2016. Son says that she is at or near baseline. SLP educated patient and family on home health services if more support is needed. Patient's son is in agreement that no follow up is needed and SLP is signing off at this time.    SLP Assessment  SLP Recommendation/Assessment: Patient does not need any further Speech Language Pathology Services SLP Visit Diagnosis: Cognitive communication deficit (R41.841)     Assistance Recommended at Discharge      Functional Status Assessment    Frequency and Duration           SLP Evaluation Cognition  Overall Cognitive Status: Within Functional Limits for tasks assessed Arousal/Alertness: Awake/alert Orientation Level: Disoriented to situation;Oriented to person;Oriented to place;Disoriented to time Day of Week: Incorrect Attention: Sustained Sustained Attention: Appears intact Awareness: Appears intact Problem Solving: Appears intact       Comprehension  Auditory Comprehension Overall Auditory Comprehension: Appears within functional limits for tasks assessed Yes/No Questions: Within Functional Limits    Expression Expression Primary Mode of Expression: Verbal Verbal Expression Overall Verbal Expression: Appears within functional limits for tasks assessed Repetition: No impairment   Oral / Motor  Oral Motor/Sensory Function Overall Oral Motor/Sensory Function: Within functional limits Motor Speech Overall Motor Speech: Appears within functional limits for tasks assessed Respiration: Within functional limits Phonation: Normal Resonance: Within functional limits Articulation: Within functional limitis Intelligibility: Intelligible Motor Planning: Within functional limits Motor Speech Errors: Not applicable           Damien Hy  Graduate SLP Clinican

## 2024-05-02 NOTE — Care Management Important Message (Signed)
 Important Message  Patient Details  Name: [name] MRN: [id number] Date of Birth: 22-Dec-1945   Important Message Given:  Yes - Medicare IM     Claretta Deed 05/02/2024, 3:05 PM

## 2024-05-02 NOTE — Progress Notes (Signed)
 [facility] Progress Note Patient: [name] FMW:[id number] DOB: 08/19/1945  DOA: 04/29/2024 DOS: the patient was seen and examined on 05/02/2024  Brief Hospital Course: Patient with PMH of CAD, CVA, depression, type II DM, HLD, HTN, PFO, chronic HFpEF presented to the hospital with complaints of abdominal pain nausea and vomiting.  Bedbound at baseline.  Uses Hoyer lift and hospital bed.  She scheduled Found to have septic shock due to Proteus UTI. Admitted to the ICU.  Required pressors.  Currently improving.  Assessment and Plan: Septic shock, present on admission secondary to Proteus bacteremia and UTI. Met SIRS criteria upon admission. Blood culture positive for Proteus. Urine culture also positive for Enterococcus. Treated with IV antibiotic. Currently on IV ampicillin . Perform repeat culture. Sepsis physiology appears to have resolved.  Severe constipation. Passing gas and had a BM. CT abdomen shows evidence of significant stool burden. X-ray confirms it again. Continue with enema and monitor.  AKI with bilateral hydronephrosis. Hypomagnesemia Baseline serum creatinine 0.7.  Upon admission serum creatinine 1.54. Renal function near normal. Monitor.  Elevated troponin. Elevated BNP. Likely demand ischemia in the setting of sepsis and septic shock. Monitor for now. Does not appear to be volume overloaded.  Leukocytosis and thrombocytopenia. Normocytic anemia. WBC improving. Hemoglobin trending down likely secondary to dilution from IV fluid. Platelet count trending down as well. Also possibility for marrow suppression in the setting of gram-negative sepsis. Monitor for now.  Type 2 diabetes mellitus, uncontrolled with hyperglycemia. Monitor sliding scale insulin . Does not use insulin  at baseline.  History of CVA with right-sided hemiparesis. No new focal deficit. Continue DOAC. Continue statin.  History of seizures. On Keppra . Continue.  Right  pretibial ulcer.  Stage II. Present on admission. Monitor.  Chronic bedbound status. PT OT consult. Continue home therapy.  Subjective: No acute complaint.  No nausea no vomiting no fever no chills.  Passing gas.  Tolerating oral diet.  Physical Exam: Clear to auscultation. Bowel sound present EXTR nontender. No edema. Right-sided weakness.  Data Reviewed: I have Reviewed nursing notes, Vitals, and Lab results. Since last encounter, pertinent lab results CBC and BMP   . I have ordered test including CBC and BMP  .   Disposition: Status is: Inpatient Remains inpatient appropriate because: Monitor for improvement in infection  SCDs Start: 04/30/24 1502 apixaban  (ELIQUIS ) tablet 5 mg   Family Communication: No one at bedside Level of care: Med-Surg   Vitals:   05/01/24 2048 05/02/24 0434 05/02/24 0739 05/02/24 1643  BP: 118/66 134/73 131/64 139/78  Pulse: (!) 103 96 83 (!) 109  Resp: 18  16 16   Temp: 98.6 F (37 C) 98.5 F (36.9 C) 98.4 F (36.9 C) 99.5 F (37.5 C)  TempSrc:   Oral   SpO2: 95% 100% 99% 98%  Weight:      Height:         Author: Yetta Blanch, MD 05/02/2024 5:53 PM  Please look on [email or web address] to find out who is on call.

## 2024-05-02 NOTE — TOC CM/SW Note (Signed)
 Transition of Care [facility]) - Inpatient Brief Assessment   Patient Details  Name: [name] MRN: [id number] Date of Birth: 1946-04-08  [facility]) CM/SW Contact:    [name], Harvest Muskrat, RN Phone Number: 05/02/2024, 10:07 AM   Clinical Narrative:  Patient presented to the ED with Lower Abdominal pain, Nausea. Found to have severe Sepsis 2/2 UTI in the setting of non-obstructive Rt Renal Stone. No immediate per Urology. On IV abx.  Patient has hx of Dementia, CVA with Rt Hemiparesis, T2DM, Seizure Disorder, HFpEF, HTN, HLD, Chronic Rt Heel Pressure Ulcer.  CM spoke with patient and son, [name] at bedside about needs for post hospital transition. Patient lives with son, [name] who is his primary caregiver. Patient is bed/wheelchair bound and needs total care assistance. Has all necessary DME's at home. Has two private Personal caregivers that assists, [name] states he applied for the CAP program through [facility] and has a scheduled assessment appointment on 05/09/24. Patient uses [employer] to and from appointments.  PCP is Norleen Lynwood ORN, MD and uses [employer] at [employer] [facility] [facility].   Patient is currently active with [facility] for home Health RN for Wound care and also goes to [facility] once a week. Resumption of care referral called in to [facility] with acceptance voiced, info on AVS.  Patient not Medically ready for discharge.  CM will continue to follow as patient progresses with care towards discharge          Transition of Care Asessment: Insurance and Status: Insurance coverage has been reviewed Patient has primary care physician: Yes Home environment has been reviewed: Yes Prior level of function:: Total Care Prior/Current Home Services: Current home services Social Drivers of Health Review: SDOH reviewed no interventions necessary Readmission risk has been reviewed: Yes Transition of care needs:  transition of care needs identified, TOC will continue to follow

## 2024-05-03 DIAGNOSIS — A419 Sepsis, unspecified organism: Secondary | ICD-10-CM | POA: Diagnosis not present

## 2024-05-03 DIAGNOSIS — N390 Urinary tract infection, site not specified: Secondary | ICD-10-CM | POA: Diagnosis not present

## 2024-05-03 LAB — CBC
HCT: 30.2 % — ABNORMAL LOW (ref 36.0–46.0)
Hemoglobin: 9.8 g/dL — ABNORMAL LOW (ref 12.0–15.0)
MCH: 27.2 pg (ref 26.0–34.0)
MCHC: 32.5 g/dL (ref 30.0–36.0)
MCV: 83.9 fL (ref 80.0–100.0)
Platelets: 119 K/uL — ABNORMAL LOW (ref 150–400)
RBC: 3.6 MIL/uL — ABNORMAL LOW (ref 3.87–5.11)
RDW: 13.2 % (ref 11.5–15.5)
WBC: 9.1 K/uL (ref 4.0–10.5)
nRBC: 0 % (ref 0.0–0.2)

## 2024-05-03 LAB — BASIC METABOLIC PANEL WITH GFR
Anion gap: 12 (ref 5–15)
BUN: 12 mg/dL (ref 8–23)
CO2: 24 mmol/L (ref 22–32)
Calcium: 8 mg/dL — ABNORMAL LOW (ref 8.9–10.3)
Chloride: 103 mmol/L (ref 98–111)
Creatinine, Ser: 0.86 mg/dL (ref 0.44–1.00)
GFR, Estimated: >60 mL/min (ref >60)
Glucose, Bld: 171 mg/dL — ABNORMAL HIGH (ref 70–99)
Potassium: 3.5 mmol/L (ref 3.5–5.1)
Sodium: 139 mmol/L (ref 135–145)

## 2024-05-03 LAB — GLUCOSE, CAPILLARY
Glucose-Capillary: 234 mg/dL — ABNORMAL HIGH (ref 70–99)
Glucose-Capillary: 125 mg/dL — ABNORMAL HIGH (ref 70–99)
Glucose-Capillary: 205 mg/dL — ABNORMAL HIGH (ref 70–99)
Glucose-Capillary: 162 mg/dL — ABNORMAL HIGH (ref 70–99)
Glucose-Capillary: 158 mg/dL — ABNORMAL HIGH (ref 70–99)

## 2024-05-03 LAB — MAGNESIUM: Magnesium: 1.8 mg/dL (ref 1.7–2.4)

## 2024-05-03 MED ORDER — SORBITOL 70 % SOLN
60.0000 mL | Freq: Every day | Status: DC
  Administered 2024-05-03 – 2024-05-04 (×2): 60 mL via ORAL
  Filled 2024-05-03 (×2): qty 60

## 2024-05-03 MED ORDER — ORAL CARE MOUTH RINSE
15.0000 mL | OROMUCOSAL | Status: DC | PRN
Start: 2024-05-03 — End: 2024-05-06

## 2024-05-03 MED ORDER — MAGNESIUM CITRATE PO SOLN
1.0000 | Freq: Once | ORAL | Status: DC
Start: 2024-05-03 — End: 2024-05-03

## 2024-05-03 MED ORDER — MIDODRINE HCL 5 MG PO TABS
5.0000 mg | ORAL_TABLET | Freq: Two times a day (BID) | ORAL | Status: DC
  Administered 2024-05-03 – 2024-05-06 (×8): 5 mg via ORAL
  Filled 2024-05-03 (×8): qty 1

## 2024-05-03 MED ORDER — SENNOSIDES-DOCUSATE SODIUM 8.6-50 MG PO TABS
2.0000 | ORAL_TABLET | Freq: Two times a day (BID) | ORAL | Status: DC
  Administered 2024-05-03 – 2024-05-04 (×3): 2 via ORAL
  Filled 2024-05-03 (×3): qty 2

## 2024-05-03 MED ORDER — SODIUM CHLORIDE 0.9 % IV BOLUS
1000.0000 mL | Freq: Once | INTRAVENOUS | Status: AC
  Administered 2024-05-03: 1000 mL via INTRAVENOUS

## 2024-05-03 MED ORDER — FLEET ENEMA RE ENEM
1.0000 | ENEMA | Freq: Once | RECTAL | Status: AC
  Administered 2024-05-03: 1 via RECTAL
  Filled 2024-05-03: qty 1

## 2024-05-03 NOTE — Hospital Course (Addendum)
 Patient with PMH of CAD, CVA, depression, type II DM, HLD, HTN, PFO, chronic HFpEF presented to the hospital with complaints of abdominal pain nausea and vomiting.  Bedbound at baseline.  Uses Hoyer lift and hospital bed.  She scheduled Found to have septic shock due to Proteus UTI. Admitted to the ICU.  Required pressors.   Assessment and Plan: Septic shock, present on admission secondary to Proteus bacteremia and UTI. Met SIRS criteria upon admission. Blood culture positive for Proteus. Urine culture also positive for Enterococcus. Treated with IV antibiotic.  Switch to oral ampicillin .  Continue midodrine. Sepsis physiology appears to have resolved.  Severe constipation. Treated with enema and oral bowel regimen. X-ray shows improvement.  Continue aggressive oral bowel regimen. Monitor.   AKI with bilateral hydronephrosis. Hypomagnesemia Baseline serum creatinine 0.7.  Upon admission serum creatinine 1.54. Now renal function near normal. On repeat ultrasound renal on 10/31 hydronephrosis on the right resolved. Monitor    Elevated troponin. Elevated BNP. Likely demand ischemia in the setting of sepsis and septic shock. Monitor for now. Does not appear to be volume overloaded.   Leukocytosis and thrombocytopenia. Normocytic anemia. WBC normalized. Hemoglobin trending down likely secondary to dilution from IV fluid. Platelet count went down to 111, now improving to normal. Mostly marrow suppression in the setting of gram-negative sepsis. Monitor for now.   Type 2 diabetes mellitus, uncontrolled with hyperglycemia. Monitor sliding scale insulin . Does not use insulin  at baseline.   History of CVA with right-sided hemiparesis. No new focal deficit. Continue DOAC. Continue statin.   History of seizures. On Keppra . Continue.   Right pretibial ulcer.  Stage II. Present on admission. Monitor.  Continue wound care for WOC RN.   Chronic bedbound status. PT OT were  consulted.  Currently plan is to go home with caregivers.  Right upper and bilateral lower extremity edema. In the setting of hypoalbuminemia. Also directed on early mobility and IV fluid received in the ICU. Lower extremity swelling is improving significantly. Right upper extremity swelling unchanged upon my examination. Doppler is negative for any DVT.

## 2024-05-03 NOTE — Plan of Care (Signed)
   Problem: Education: Goal: Ability to describe self-care measures that may prevent or decrease complications (Diabetes Survival Skills Education) will improve Outcome: Completed/Met

## 2024-05-03 NOTE — Plan of Care (Signed)
  Problem: Education: Goal: Individualized Educational Video(s) Outcome: Completed/Met

## 2024-05-03 NOTE — Progress Notes (Signed)
 [facility] Progress Note Patient: [name] FMW:[id number] DOB: [birth year]  DOA: 04/29/2024 DOS: the patient was seen and examined on 05/03/2024  Brief Hospital Course: Patient with PMH of CAD, CVA, depression, type II DM, HLD, HTN, PFO, chronic HFpEF presented to the hospital with complaints of abdominal pain nausea and vomiting.  Bedbound at baseline.  Uses Hoyer lift and hospital bed.  She scheduled Found to have septic shock due to Proteus UTI. Admitted to the ICU.  Required pressors.  Currently improving. Assessment and Plan: Septic shock, present on admission secondary to Proteus bacteremia and UTI. Met SIRS criteria upon admission. Blood culture positive for Proteus. Urine culture also positive for Enterococcus. Treated with IV antibiotic. Currently on IV ampicillin .,  Likely switch to oral tomorrow. Perform repeat culture. Sepsis physiology appears to have resolved.   Severe constipation. Passing gas and had a BM. CT abdomen shows evidence of significant stool burden. X-ray confirms it again. Treated with enema.  Continue bowel regimen.  Repeat x-ray tomorrow.  If x-ray shows improvement in constipation will perform ultrasound renal for bilateral hydronephrosis.   AKI with bilateral hydronephrosis. Hypomagnesemia Baseline serum creatinine 0.7.  Upon admission serum creatinine 1.54. Renal function near normal. Monitor.   Elevated troponin. Elevated BNP. Likely demand ischemia in the setting of sepsis and septic shock. Monitor for now. Does not appear to be volume overloaded.   Leukocytosis and thrombocytopenia. Normocytic anemia. WBC improving. Hemoglobin trending down likely secondary to dilution from IV fluid. Platelet count trending down as well. Also possibility for marrow suppression in the setting of gram-negative sepsis. Monitor for now.   Type 2 diabetes mellitus, uncontrolled with hyperglycemia. Monitor sliding scale insulin . Does not use  insulin  at baseline.   History of CVA with right-sided hemiparesis. No new focal deficit. Continue DOAC. Continue statin.   History of seizures. On Keppra . Continue.   Right pretibial ulcer.  Stage II. Present on admission. Monitor.   Chronic bedbound status. PT OT consult. Continue home therapy.   Subjective: No nausea no vomiting.  Feeling better.  Abdominal pain resolved.  Physical Exam: Clear to auscultation. S1-S2 present and bowel sound present Chronic unchanged bilateral lower extremity edema.  Data Reviewed: I have Reviewed nursing notes, Vitals, and Lab results. Since last encounter, pertinent lab results CBC and BMP   . I have ordered test including CBC BMP x-ray abdomen  .   Disposition: Status is: Inpatient Remains inpatient appropriate because: Monitor for improvement in hydronephrosis  SCDs Start: 04/30/24 1502 apixaban  (ELIQUIS ) tablet 5 mg   Family Communication: Discussed with son on the phone. Level of care: Med-Surg   Vitals:   05/03/24 0711 05/03/24 0812 05/03/24 1133 05/03/24 1727  BP:  (!) 86/51 128/76 121/60  Pulse:  92 98 86  Resp:  18  18  Temp:  98.3 F (36.8 C) 98.2 F (36.8 C) (!) 97.5 F (36.4 C)  TempSrc:   Oral Oral  SpO2:  97% 98% 98%  Weight: 76.5 kg     Height:         Author: Yetta Blanch, MD 05/03/2024 6:01 PM  Please look on [email or web address] to find out who is on call.

## 2024-05-04 ENCOUNTER — Inpatient Hospital Stay (HOSPITAL_COMMUNITY)

## 2024-05-04 DIAGNOSIS — N390 Urinary tract infection, site not specified: Secondary | ICD-10-CM | POA: Diagnosis not present

## 2024-05-04 DIAGNOSIS — A419 Sepsis, unspecified organism: Secondary | ICD-10-CM | POA: Diagnosis not present

## 2024-05-04 DIAGNOSIS — I509 Heart failure, unspecified: Secondary | ICD-10-CM | POA: Diagnosis not present

## 2024-05-04 LAB — ECHOCARDIOGRAM COMPLETE
Area-P 1/2: 3.65 cm2
Height: 67 in
S' Lateral: 2.8 cm
Weight: 2698.43 [oz_av]

## 2024-05-04 LAB — CBC
HCT: 29 % — ABNORMAL LOW (ref 36.0–46.0)
Hemoglobin: 9.3 g/dL — ABNORMAL LOW (ref 12.0–15.0)
MCH: 27.3 pg (ref 26.0–34.0)
MCHC: 32.1 g/dL (ref 30.0–36.0)
MCV: 85 fL (ref 80.0–100.0)
Platelets: 150 K/uL (ref 150–400)
RBC: 3.41 MIL/uL — ABNORMAL LOW (ref 3.87–5.11)
RDW: 13.2 % (ref 11.5–15.5)
WBC: 9 K/uL (ref 4.0–10.5)
nRBC: 0 % (ref 0.0–0.2)

## 2024-05-04 LAB — MAGNESIUM: Magnesium: 1.8 mg/dL (ref 1.7–2.4)

## 2024-05-04 LAB — GLUCOSE, CAPILLARY
Glucose-Capillary: 134 mg/dL — ABNORMAL HIGH (ref 70–99)
Glucose-Capillary: 173 mg/dL — ABNORMAL HIGH (ref 70–99)
Glucose-Capillary: 87 mg/dL (ref 70–99)
Glucose-Capillary: 179 mg/dL — ABNORMAL HIGH (ref 70–99)

## 2024-05-04 LAB — BASIC METABOLIC PANEL WITH GFR
Anion gap: 12 (ref 5–15)
BUN: 7 mg/dL — ABNORMAL LOW (ref 8–23)
CO2: 23 mmol/L (ref 22–32)
Calcium: 8.2 mg/dL — ABNORMAL LOW (ref 8.9–10.3)
Chloride: 106 mmol/L (ref 98–111)
Creatinine, Ser: 0.79 mg/dL (ref 0.44–1.00)
GFR, Estimated: >60 mL/min (ref >60)
Glucose, Bld: 134 mg/dL — ABNORMAL HIGH (ref 70–99)
Potassium: 3.4 mmol/L — ABNORMAL LOW (ref 3.5–5.1)
Sodium: 141 mmol/L (ref 135–145)

## 2024-05-04 MED ORDER — PERFLUTREN LIPID MICROSPHERE
1.0000 mL | INTRAVENOUS | Status: AC | PRN
  Administered 2024-05-04: 2 mL via INTRAVENOUS

## 2024-05-04 MED ORDER — ORAL CARE MOUTH RINSE: 15.0000 mL | OROMUCOSAL | Status: DC | PRN

## 2024-05-04 MED ORDER — SENNOSIDES-DOCUSATE SODIUM 8.6-50 MG PO TABS
1.0000 | ORAL_TABLET | Freq: Two times a day (BID) | ORAL | Status: DC
  Administered 2024-05-04: 1 via ORAL
  Filled 2024-05-04: qty 1

## 2024-05-04 MED ORDER — BISACODYL 10 MG RE SUPP
10.0000 mg | Freq: Once | RECTAL | Status: DC
  Filled 2024-05-04: qty 1

## 2024-05-04 MED ORDER — POTASSIUM CHLORIDE CRYS ER 20 MEQ PO TBCR
40.0000 meq | EXTENDED_RELEASE_TABLET | Freq: Once | ORAL | Status: AC
  Administered 2024-05-04: 40 meq via ORAL
  Filled 2024-05-04: qty 2

## 2024-05-04 MED ORDER — AMOXICILLIN 500 MG PO CAPS
1000.0000 mg | ORAL_CAPSULE | Freq: Three times a day (TID) | ORAL | Status: DC
  Administered 2024-05-04 – 2024-05-06 (×8): 1000 mg via ORAL
  Filled 2024-05-04 (×8): qty 2

## 2024-05-04 NOTE — Progress Notes (Signed)
 [facility] Progress Note Patient: [name] FMW:[id number] DOB: [birth year]  DOA: 04/29/2024 DOS: the patient was seen and examined on 05/04/2024  Brief Hospital Course: Patient with PMH of CAD, CVA, depression, type II DM, HLD, HTN, PFO, chronic HFpEF presented to the hospital with complaints of abdominal pain nausea and vomiting.  Bedbound at baseline.  Uses Hoyer lift and hospital bed.  She scheduled Found to have septic shock due to Proteus UTI. Admitted to the ICU.  Required pressors.  Currently improving. Assessment and Plan: Septic shock, present on admission secondary to Proteus bacteremia and UTI. Met SIRS criteria upon admission. Blood culture positive for Proteus. Urine culture also positive for Enterococcus. Treated with IV antibiotic.  Switch to oral ampicillin .  Continue midodrine. Perform repeat culture. Sepsis physiology appears to have resolved.   Severe constipation. Treated with enema and oral bowel regimen. X-ray shows improvement. Monitor.   AKI with bilateral hydronephrosis. Hypomagnesemia Baseline serum creatinine 0.7.  Upon admission serum creatinine 1.54. Renal function near normal. Renal function improved to normal. Hydronephrosis on the right resolved. Monitor    Elevated troponin. Elevated BNP. Likely demand ischemia in the setting of sepsis and septic shock. Monitor for now. Does not appear to be volume overloaded.   Leukocytosis and thrombocytopenia. Normocytic anemia. WBC improving. Hemoglobin trending down likely secondary to dilution from IV fluid. Platelet count trending down as well. Also possibility for marrow suppression in the setting of gram-negative sepsis. Monitor for now.   Type 2 diabetes mellitus, uncontrolled with hyperglycemia. Monitor sliding scale insulin . Does not use insulin  at baseline.   History of CVA with right-sided hemiparesis. No new focal deficit. Continue DOAC. Continue statin.   History of  seizures. On Keppra . Continue.   Right pretibial ulcer.  Stage II. Present on admission. Monitor.   Chronic bedbound status. PT OT consult. Continue home therapy.  Anasarca. Likely) in the setting of hypoalbuminemia. Also directed on early mobility and IV fluid received in the ICU. Lower extremity swelling is improving significantly. Approximately swelling per son is worsening. Clinically does not appear to be as worsening.  Will monitor.  Check ultrasound Doppler.   Subjective: Denies any acute complaint.  Minimal oral intake.  No nausea vomiting fever chills.  Physical Exam: Basal crackles. S1-S2 present Bowel sounds normal Unchanged upper extremity edema. Improving lower extremity edema.  Data Reviewed: I have Reviewed nursing notes, Vitals, and Lab results. Since last encounter, pertinent lab results CBC and BMP   . I have ordered test including CBC and BMP  . I have ordered imaging x-ray abdomen, ultrasound renal, ultrasound Doppler  .   Disposition: Status is: Inpatient Remains inpatient appropriate because: Monitor for improvement in infection  SCDs Start: 04/30/24 1502 apixaban  (ELIQUIS ) tablet 5 mg    Family Communication: Discussed with son over the phone. Level of care: Med-Surg   Vitals:   05/03/24 2100 05/04/24 0439 05/04/24 0830 05/04/24 1650  BP: 122/69 (!) 113/57 125/67 136/78  Pulse: 98 (!) 101 (!) 107 93  Resp: 20 18    Temp: 98.1 F (36.7 C) 98.4 F (36.9 C)  99 F (37.2 C)  TempSrc: Oral Oral  Oral  SpO2: 100% 100% 95% 99%  Weight:      Height:         Author: Yetta Blanch, MD 05/04/2024 5:34 PM  Please look on [email or web address] to find out who is on call.

## 2024-05-04 NOTE — Plan of Care (Signed)
  Problem: Coping: Goal: Ability to adjust to condition or change in health will improve Outcome: Progressing   Problem: Health Behavior/Discharge Planning: Goal: Ability to identify and utilize available resources and services will improve Outcome: Progressing   Problem: Skin Integrity: Goal: Risk for impaired skin integrity will decrease Outcome: Progressing

## 2024-05-04 NOTE — Consult Note (Signed)
 WOC Nurse wound follow up Wound type: old PI to RLE achilles region Measurement: 6 cm x 3 cm with two islands scabbed tissue measuring 2 cm x 2 cm superior and 3 cm x 2 cm inferior  Wound bed: pink, dry with two islands of scab tissue Drainage (amount, consistency, odor) none Periwound: intact, scar tissue  Dressing procedure/placement/frequency:  MWF: Cleanse R lower leg wound with Vashe (lawson # [id number]), apply antibiotic ointment to wound beds (3) then cover with collagen pieces cut to fit wound beds. Cover with silicone foam dressing and secure with Kerlix roll gauze beginning right above toes and ending above wound, then wrap with coban, netting.   Son provides collagen dressing from home supply which is not on hospital formulary.     WOC team will continue to follow patient for dressing change.   Thank you,  Doyal Polite, MSN, RN, [facility] WOC Team [phone] (Available Mon-Fri 0700-1500)

## 2024-05-05 ENCOUNTER — Inpatient Hospital Stay (HOSPITAL_COMMUNITY)

## 2024-05-05 DIAGNOSIS — R609 Edema, unspecified: Secondary | ICD-10-CM | POA: Diagnosis not present

## 2024-05-05 DIAGNOSIS — A419 Sepsis, unspecified organism: Secondary | ICD-10-CM | POA: Diagnosis not present

## 2024-05-05 DIAGNOSIS — N390 Urinary tract infection, site not specified: Secondary | ICD-10-CM | POA: Diagnosis not present

## 2024-05-05 LAB — CBC
HCT: 29.1 % — ABNORMAL LOW (ref 36.0–46.0)
Hemoglobin: 9.2 g/dL — ABNORMAL LOW (ref 12.0–15.0)
MCH: 27.1 pg (ref 26.0–34.0)
MCHC: 31.6 g/dL (ref 30.0–36.0)
MCV: 85.6 fL (ref 80.0–100.0)
Platelets: 197 K/uL (ref 150–400)
RBC: 3.4 MIL/uL — ABNORMAL LOW (ref 3.87–5.11)
RDW: 13.2 % (ref 11.5–15.5)
WBC: 8.2 K/uL (ref 4.0–10.5)
nRBC: 0 % (ref 0.0–0.2)

## 2024-05-05 LAB — BASIC METABOLIC PANEL WITH GFR
Anion gap: 12 (ref 5–15)
BUN: 5 mg/dL — ABNORMAL LOW (ref 8–23)
CO2: 22 mmol/L (ref 22–32)
Calcium: 8.3 mg/dL — ABNORMAL LOW (ref 8.9–10.3)
Chloride: 107 mmol/L (ref 98–111)
Creatinine, Ser: 0.85 mg/dL (ref 0.44–1.00)
GFR, Estimated: >60 mL/min (ref >60)
Glucose, Bld: 118 mg/dL — ABNORMAL HIGH (ref 70–99)
Potassium: 3.7 mmol/L (ref 3.5–5.1)
Sodium: 141 mmol/L (ref 135–145)

## 2024-05-05 LAB — GLUCOSE, CAPILLARY
Glucose-Capillary: 93 mg/dL (ref 70–99)
Glucose-Capillary: 130 mg/dL — ABNORMAL HIGH (ref 70–99)
Glucose-Capillary: 188 mg/dL — ABNORMAL HIGH (ref 70–99)
Glucose-Capillary: 152 mg/dL — ABNORMAL HIGH (ref 70–99)

## 2024-05-05 LAB — MAGNESIUM: Magnesium: 1.7 mg/dL (ref 1.7–2.4)

## 2024-05-05 MED ORDER — SENNOSIDES-DOCUSATE SODIUM 8.6-50 MG PO TABS
2.0000 | ORAL_TABLET | Freq: Two times a day (BID) | ORAL | Status: DC
  Administered 2024-05-05 – 2024-05-06 (×3): 2 via ORAL
  Filled 2024-05-05 (×4): qty 2

## 2024-05-05 NOTE — Progress Notes (Signed)
 VASCULAR LAB    Bilateral upper extremity venous duplex has been performed.  See CV proc for preliminary results.   Ziyah Cordoba, RVT 05/05/2024, 4:06 PM

## 2024-05-05 NOTE — Plan of Care (Signed)
  Problem: Fluid Volume: Goal: Ability to maintain a balanced intake and output will improve Outcome: Progressing   Problem: Nutritional: Goal: Maintenance of adequate nutrition will improve Outcome: Progressing   Problem: Skin Integrity: Goal: Risk for impaired skin integrity will decrease Outcome: Progressing

## 2024-05-05 NOTE — Progress Notes (Signed)
 Patient ate 50 % of her breakfast this morning. MD notified.

## 2024-05-05 NOTE — Progress Notes (Signed)
 [facility] Progress Note Patient: [name] FMW:[id number] DOB: 1946-02-03  DOA: 04/29/2024 DOS: the patient was seen and examined on 05/05/2024  Brief Hospital Course: Patient with PMH of CAD, CVA, depression, type II DM, HLD, HTN, PFO, chronic HFpEF presented to the hospital with complaints of abdominal pain nausea and vomiting.  Bedbound at baseline.  Uses Hoyer lift and hospital bed.  She scheduled Found to have septic shock due to Proteus UTI. Admitted to the ICU.  Required pressors.   Assessment and Plan: Septic shock, present on admission secondary to Proteus bacteremia and UTI. Met SIRS criteria upon admission. Blood culture positive for Proteus. Urine culture also positive for Enterococcus. Treated with IV antibiotic.  Switch to oral ampicillin .  Continue midodrine. Sepsis physiology appears to have resolved.  Severe constipation. Treated with enema and oral bowel regimen. X-ray shows improvement.  Continue aggressive oral bowel regimen. Monitor.   AKI with bilateral hydronephrosis. Hypomagnesemia Baseline serum creatinine 0.7.  Upon admission serum creatinine 1.54. Now renal function near normal. On repeat ultrasound renal on 10/31 hydronephrosis on the right resolved. Monitor    Elevated troponin. Elevated BNP. Likely demand ischemia in the setting of sepsis and septic shock. Monitor for now. Does not appear to be volume overloaded.   Leukocytosis and thrombocytopenia. Normocytic anemia. WBC normalized. Hemoglobin trending down likely secondary to dilution from IV fluid. Platelet count went down to 111, now improving to normal. Mostly marrow suppression in the setting of gram-negative sepsis. Monitor for now.   Type 2 diabetes mellitus, uncontrolled with hyperglycemia. Monitor sliding scale insulin . Does not use insulin  at baseline.   History of CVA with right-sided hemiparesis. No new focal deficit. Continue DOAC. Continue statin.   History  of seizures. On Keppra . Continue.   Right pretibial ulcer.  Stage II. Present on admission. Monitor.  Continue wound care for WOC RN.   Chronic bedbound status. PT OT were consulted.  Currently plan is to go home with caregivers.  Right upper and bilateral lower extremity edema. In the setting of hypoalbuminemia. Also directed on early mobility and IV fluid received in the ICU. Lower extremity swelling is improving significantly. Right upper extremity swelling unchanged upon my examination. Doppler is negative for any DVT.   Subjective: No nausea no vomiting.  Denies any acute complaint.  Upon moving her right arm she is in pain specifically touching her hand.  Likely from contracture.  Denies any pain on examination of the elbow or shoulder or wrist joint.  Physical Exam: Clear to auscultation. S1-S2 present Bowel sound present Right upper extremity swelling.  Bilateral lower extremity edema.  Chronic right-sided weakness.  Data Reviewed: I have Reviewed nursing notes, Vitals, and Lab results. Since last encounter, pertinent lab results CBC and BMP   . I have ordered test including none  .   Disposition: Status is: Inpatient Remains inpatient appropriate because: If remain overnight stable will be stable for discharge tomorrow.  SCDs Start: 04/30/24 1502 apixaban  (ELIQUIS ) tablet 5 mg   Family Communication: Discussed with son on the phone. Level of care: Med-Surg   Vitals:   05/05/24 0038 05/05/24 0438 05/05/24 0813 05/05/24 1749  BP: 139/88 139/88 122/68 139/83  Pulse: 78  86 91  Resp:  (!) 21 17   Temp:  98.7 F (37.1 C) 98.9 F (37.2 C) 98.2 F (36.8 C)  TempSrc:  Oral Oral   SpO2:  100% 100% 97%  Weight:      Height:  Author: Yetta Blanch, MD 05/05/2024 6:14 PM  Please look on [email or web address] to find out who is on call.

## 2024-05-06 DIAGNOSIS — N390 Urinary tract infection, site not specified: Secondary | ICD-10-CM | POA: Diagnosis not present

## 2024-05-06 DIAGNOSIS — A419 Sepsis, unspecified organism: Secondary | ICD-10-CM | POA: Diagnosis not present

## 2024-05-06 LAB — GLUCOSE, CAPILLARY
Glucose-Capillary: 88 mg/dL (ref 70–99)
Glucose-Capillary: 157 mg/dL — ABNORMAL HIGH (ref 70–99)
Glucose-Capillary: 158 mg/dL — ABNORMAL HIGH (ref 70–99)

## 2024-05-06 MED ORDER — FUROSEMIDE 20 MG PO TABS: 20.0000 mg | ORAL_TABLET | Freq: Every day | ORAL | 0 refills | Status: DC | PRN

## 2024-05-06 MED ORDER — DULCOLAX 5 MG PO TBEC: 5.0000 mg | DELAYED_RELEASE_TABLET | Freq: Every day | ORAL | 1 refills | Status: DC | PRN

## 2024-05-06 MED ORDER — MIDODRINE HCL 2.5 MG PO TABS: 2.5000 mg | ORAL_TABLET | Freq: Two times a day (BID) | ORAL | 0 refills | Status: DC

## 2024-05-06 MED ORDER — AMMONIUM LACTATE 12 % EX LOTN: TOPICAL_LOTION | Freq: Two times a day (BID) | CUTANEOUS | 0 refills | Status: AC

## 2024-05-06 MED ORDER — POLYETHYLENE GLYCOL 3350 17 G PO PACK: 17.0000 g | PACK | Freq: Every day | ORAL | 0 refills | Status: AC

## 2024-05-06 MED ORDER — DOCUSATE SODIUM 100 MG PO CAPS: 100.0000 mg | ORAL_CAPSULE | Freq: Two times a day (BID) | ORAL | 0 refills | Status: AC

## 2024-05-06 MED ORDER — AMOXICILLIN 500 MG PO CAPS: 1000.0000 mg | ORAL_CAPSULE | Freq: Three times a day (TID) | ORAL | 0 refills | Status: AC

## 2024-05-06 MED ORDER — FUROSEMIDE 20 MG PO TABS
20.0000 mg | ORAL_TABLET | Freq: Once | ORAL | Status: AC
  Administered 2024-05-06: 20 mg via ORAL
  Filled 2024-05-06: qty 1

## 2024-05-06 MED ORDER — GENTAMICIN SULFATE 0.1 % EX CREA: 1.0000 | TOPICAL_CREAM | Freq: Every day | CUTANEOUS | 0 refills | Status: DC

## 2024-05-06 MED ORDER — BISACODYL 10 MG RE SUPP: 10.0000 mg | RECTAL | 0 refills | Status: AC | PRN

## 2024-05-06 NOTE — TOC Transition Note (Signed)
 Transition of Care [facility]) - Discharge Note   Patient Details  Name: [name] MRN: [id number] Date of Birth: 01-03-1946  [facility] CM/SW Contact:  Robynn Eileen Hoose, RN Phone Number: 05/06/2024, 10:22 AM   Clinical Narrative:   Secure message from provider regarding patient possible discharge today. Lynette with [facility] made aware.          Patient Goals and CMS Choice            Discharge Placement                       Discharge Plan and Services Additional resources added to the After Visit Summary for                                       Social Drivers of Health (SDOH) Interventions SDOH Screenings   Food Insecurity: No Food Insecurity (05/01/2024)  Housing: Low Risk  (05/01/2024)  Transportation Needs: No Transportation Needs (05/01/2024)  Utilities: Not At Risk (05/01/2024)  Depression (PHQ2-9): Low Risk  (04/23/2024)  Social Connections: Socially Integrated (05/01/2024)  Tobacco Use: Medium Risk (05/01/2024)     Readmission Risk Interventions    05/02/2024   10:07 AM  Readmission Risk Prevention Plan  Transportation Screening Complete  PCP or Specialist Appt within 5-7 Days Complete  Home Care Screening Complete  Medication Review (RN CM) Referral to Pharmacy

## 2024-05-06 NOTE — Progress Notes (Signed)
 Patient Foley discontinued. We will perform voiding trial before patient is discharge.

## 2024-05-06 NOTE — Progress Notes (Signed)
 Patient bladder scan is 0 ml. We will continue monitoring urine output.

## 2024-05-06 NOTE — Progress Notes (Signed)
 This nurse called patient's son. Patient will arrange transportation.

## 2024-05-06 NOTE — Plan of Care (Signed)
  Problem: Fluid Volume: Goal: Ability to maintain a balanced intake and output will improve Outcome: Progressing   Problem: Health Behavior/Discharge Planning: Goal: Ability to manage health-related needs will improve Outcome: Progressing   Problem: Nutritional: Goal: Maintenance of adequate nutrition will improve Outcome: Progressing

## 2024-05-07 ENCOUNTER — Other Ambulatory Visit: Payer: Self-pay | Admitting: Internal Medicine

## 2024-05-07 NOTE — Telephone Encounter (Signed)
 Copied from CRM [id number]. Topic: Clinical - Medication Refill >> May 07, 2024  8:26 AM Aleatha C wrote: Medication: ELIQUIS  5 MG TABS tablet, levETIRAcetam  (KEPPRA ) 500 MG tablet, atorvastatin  (LIPITOR) 20 MG tablet,   Has the patient contacted their pharmacy? Yes (Agent: If no, request that the patient contact the pharmacy for the refill. If patient does not wish to contact the pharmacy document the reason why and proceed with request.) (Agent: If yes, when and what did the pharmacy advise?)  This is the patient's preferred pharmacy:  [id number] - [place] ([place] - [address] [place] ([place]) [place] [address] Phone: [phone] Fax: [phone]   Is this the correct pharmacy for this prescription? Yes If no, delete pharmacy and type the correct one.   Has the prescription been filled recently? No  Is the patient out of the medication? Yes  Has the patient been seen for an appointment in the last year OR does the patient have an upcoming appointment? Yes  Can we respond through MyChart? No  Agent: Please be advised that Rx refills may take up to 3 business days. We ask that you follow-up with your pharmacy.

## 2024-05-08 ENCOUNTER — Encounter: Payer: Self-pay | Admitting: Internal Medicine

## 2024-05-08 ENCOUNTER — Other Ambulatory Visit: Payer: Self-pay

## 2024-05-08 MED ORDER — ATORVASTATIN CALCIUM 20 MG PO TABS: 20.0000 mg | ORAL_TABLET | Freq: Every day | ORAL | 3 refills | Status: DC

## 2024-05-08 MED ORDER — ELIQUIS 5 MG PO TABS: 5.0000 mg | ORAL_TABLET | Freq: Two times a day (BID) | ORAL | 3 refills | Status: DC

## 2024-05-08 MED ORDER — LEVETIRACETAM 500 MG PO TABS: 500.0000 mg | ORAL_TABLET | Freq: Two times a day (BID) | ORAL | 3 refills | Status: DC

## 2024-05-08 NOTE — Discharge Summary (Signed)
 Physician Discharge Summary   Patient: [name] MRN: [id number] DOB: 09/27/1945  Admit date:     04/29/2024  Discharge date: 05/06/2024  Discharge Physician: Yetta Blanch  PCP: Norleen Lynwood ORN, MD  Recommendations at discharge: Follow-up with PCP in 1 week. Follow-up with urology as recommended. Repeat CT chest in 3 months.   Follow-up Information     [facility] Follow up.   Specialty: Home Health Services Why: Someone will call you to schedule resumption of care visit. Contact information: [address] [place] [address] [phone]         Norleen Lynwood ORN, MD. Schedule an appointment as soon as possible for a visit in 1 week(s).   Specialties: Internal Medicine, Radiology Why: follow-up CT chest in 3 months to assess for lung nodule Contact information: [address] [place] [address] [phone]         [facility]. Call.   Why: To Establish Care Contact information: [address] [place] [address] [place]  [address] [phone]               Hospital Course: Patient with PMH of CAD, CVA, depression, type II DM, HLD, HTN, PFO, chronic HFpEF presented to the hospital with complaints of abdominal pain nausea and vomiting.  Bedbound at baseline.  Uses Hoyer lift and hospital bed.  She scheduled Found to have septic shock due to Proteus UTI. Admitted to the ICU.  Required pressors.  Transferred to hospital service.  Hemodynamics remained stable.  PT OT was consulted recommended SNF although family preferred to take the patient home.  Assessment and Plan: Septic shock, present on admission secondary to Proteus bacteremia and UTI. Met SIRS criteria upon admission. Blood culture positive for Proteus. Urine culture also positive for Enterococcus. Treated with IV antibiotic.  Switch to oral ampicillin .  Continue midodrine.  Dose has been increased. Sepsis physiology appears to have  resolved.  Severe constipation. Treated with enema and oral bowel regimen. X-ray shows improvement.  Continue aggressive oral bowel regimen. Monitor.   AKI with bilateral hydronephrosis. Hypomagnesemia Baseline serum creatinine 0.7.  Upon admission serum creatinine 1.54. Now renal function near normal. On repeat ultrasound renal on 10/31 shows hydronephrosis on the right resolved. Monitor    Elevated troponin. Elevated BNP. Likely demand ischemia in the setting of sepsis and septic shock. Monitor for now. Does not appear to be volume overloaded.   Leukocytosis and thrombocytopenia. Normocytic anemia. WBC normalized. Hemoglobin trending down likely secondary to dilution from IV fluid. Platelet count went down to 111, now improving to normal. Mostly marrow suppression in the setting of gram-negative sepsis. Monitor for now.   Type 2 diabetes mellitus, uncontrolled with hyperglycemia. Monitor sliding scale insulin . Does not use insulin  at baseline.   History of CVA with right-sided hemiparesis. No new focal deficit. Continue DOAC. Continue statin.   History of seizures. On Keppra . Continue.   Right pretibial ulcer.  Stage II. Present on admission. Monitor.  Continue wound care for WOC RN.   Chronic bedbound status. PT OT were consulted.  Currently plan is to go home with caregivers.  Right upper and bilateral lower extremity edema. In the setting of hypoalbuminemia. Also directed on early mobility and IV fluid received in the ICU. Lower extremity swelling is improving significantly. Right upper extremity swelling unchanged upon my examination. Doppler is negative for any DVT. Ordered Lasix  as needed at the request of family.  Urinary retention. Patient  had a Foley catheter placed This was removed and the patient was able to void.  Consultants:  PCCM   Procedures performed:  Echocardiogram  DISCHARGE MEDICATION: Allergies as of 05/06/2024       Reactions    Porcine (pork) Protein-containing Drug Products         Medication List     STOP taking these medications    losartan  25 MG tablet Commonly known as: COZAAR    metoprolol  succinate 25 MG 24 hr tablet Commonly known as: TOPROL -XL       TAKE these medications    acetaminophen  325 MG tablet Commonly known as: TYLENOL  Take 2 tablets (650 mg total) by mouth every 6 (six) hours as needed.   ammonium lactate 12 % lotion Commonly known as: LAC-HYDRIN Apply topically 2 (two) times daily. Apply to legs   amoxicillin 500 MG capsule Commonly known as: AMOXIL Take 2 capsules (1,000 mg total) by mouth 3 (three) times daily for 3 days.   atorvastatin  20 MG tablet Commonly known as: LIPITOR Take 1 tablet (20 mg total) by mouth daily.   diclofenac  Sodium 1 % Gel Commonly known as: VOLTAREN  Apply 4 g topically 4 (four) times daily.   docusate sodium  100 MG capsule Commonly known as: Colace Take 1 capsule (100 mg total) by mouth 2 (two) times daily.   Dulcolax 5 MG EC tablet Generic drug: bisacodyl Take 1 tablet (5 mg total) by mouth daily as needed for mild constipation.   bisacodyl 10 MG suppository Commonly known as: Dulcolax Place 1 suppository (10 mg total) rectally as needed for moderate constipation.   Eliquis  5 MG Tabs tablet Generic drug: apixaban  Take 1 tablet (5 mg total) by mouth every 12 (twelve) hours.   furosemide  20 MG tablet Commonly known as: LASIX  Take 1 tablet (20 mg total) by mouth daily as needed for edema (for weight gain of 5 lbs over 1 week).   gentamicin cream 0.1 % Commonly known as: GARAMYCIN Apply 1 Application topically daily. Apply to right lower leg wound M-W-F What changed: additional instructions   levETIRAcetam  500 MG tablet Commonly known as: KEPPRA  Take 1 tablet (500 mg total) by mouth every 12 (twelve) hours.   midodrine 2.5 MG tablet Commonly known as: PROAMATINE Take 1 tablet (2.5 mg total) by mouth 2 (two) times daily  with a meal.   polyethylene glycol 17 g packet Commonly known as: MIRALAX  / GLYCOLAX  Take 17 g by mouth daily. What changed:  when to take this reasons to take this additional instructions   Tresiba  FlexTouch 100 UNIT/ML FlexTouch Pen Generic drug: insulin  degludec Inject 10 Units into the skin at bedtime.               Discharge Care Instructions  (From admission, onward)           Start     Ordered   05/06/24 0000  Discharge wound care:       Comments: Continue home wound care. Change dressing to right leg Q Mon/Wed/Fri. Cleanse R lower leg wound with Vashe, apply antibiotic ointment to wound beds (3) then cover with collagen pieces cut to fit wound beds. Cover with silicone foam dressing and secure with Kerlix roll gauze beginning right above toes and ending above wound, then wrap with coban, netting.  Prop one pillow behind knee and another under right heel to reduce pressure.   05/06/24 1008           Disposition: Home Diet recommendation: Dysphagia diet.  Discharge Exam: Vitals:   05/05/24 2220 05/06/24 0530 05/06/24 0737 05/06/24 1602  BP: (!) 145/70 (!) 145/82 132/81 (!) 142/74  Pulse: 87 87 83 80  Resp:  15 18 19   Temp: 98.3 F (36.8 C) 98.4 F (36.9 C) 98.3 F (36.8 C) 100 F (37.8 C)  TempSrc: Oral     SpO2: 98%  98% 99%  Weight:      Height:       Alert awake and oriented. Clear to auscultation at bases. Bowel sound present. Right upper extremity and bilateral lower extremity edema.  Filed Weights   04/30/24 1813 05/01/24 1809 05/03/24 0711  Weight: 74.7 kg 76.5 kg 76.5 kg   Condition at discharge: stable  The results of significant diagnostics from this hospitalization (including imaging, microbiology, ancillary and laboratory) are listed below for reference.   Imaging Studies: VAS US  UPPER EXTREMITY VENOUS DUPLEX Result Date: 05/06/2024 UPPER VENOUS STUDY  Patient Name:  [name]  Date of Exam:   05/05/2024 Medical Rec #:  [id number]        Accession #:    [id number] Date of Birth: [birth year]         Patient Gender: F Patient Age:   77 years Exam Location:  [facility] Procedure:      VAS US  UPPER EXTREMITY VENOUS DUPLEX Referring Phys: Chi Woodham --------------------------------------------------------------------------------  Indications: Right upper extremity edema. Right hemiparesis status post stroke 2016. Anticoagulation: Eliquis . Limitations: Hemiparesis, patient in pain with minimal touch, poor ultrasound/tissue interface, bandages and line, and inability to extend arms. Comparison Study: No prior upper extremity venous on file Performing Technologist: Alberta Lis RVS  Examination Guidelines: A complete evaluation includes B-mode imaging, spectral Doppler, color Doppler, and power Doppler as needed of all accessible portions of each vessel. Bilateral testing is considered an integral part of a complete examination. Limited examinations for reoccurring indications may be performed as noted.  Right Findings: +----------+------------+---------+-----------+----------+-------------------+ RIGHT     CompressiblePhasicitySpontaneousProperties      Summary       +----------+------------+---------+-----------+----------+-------------------+ IJV           Full       No        Yes                                  +----------+------------+---------+-----------+----------+-------------------+ Subclavian               Yes       Yes                                  +----------+------------+---------+-----------+----------+-------------------+ Axillary      Full       Yes       Yes                                  +----------+------------+---------+-----------+----------+-------------------+ Brachial                 Yes       Yes                                  +----------+------------+---------+-----------+----------+-------------------+ Radial  Yes       Yes                                   +----------+------------+---------+-----------+----------+-------------------+ Ulnar                                               Not well visualized +----------+------------+---------+-----------+----------+-------------------+ Cephalic      Full                                                      +----------+------------+---------+-----------+----------+-------------------+ Basilic                                               Not visualized    +----------+------------+---------+-----------+----------+-------------------+  Left Findings: +----------+------------+---------+-----------+----------+--------------+ LEFT      CompressiblePhasicitySpontaneousProperties   Summary     +----------+------------+---------+-----------+----------+--------------+ IJV           Full       Yes       Yes                             +----------+------------+---------+-----------+----------+--------------+ Subclavian               Yes       Yes                             +----------+------------+---------+-----------+----------+--------------+ Axillary      Full       Yes       Yes                             +----------+------------+---------+-----------+----------+--------------+ Brachial      Full       Yes       Yes                             +----------+------------+---------+-----------+----------+--------------+ Radial                                              Not visualized +----------+------------+---------+-----------+----------+--------------+ Ulnar                                               Not visualized +----------+------------+---------+-----------+----------+--------------+ Cephalic      Full                                                 +----------+------------+---------+-----------+----------+--------------+ Basilic  Not visualized  +----------+------------+---------+-----------+----------+--------------+  Summary:  Bilateral: No obvious evidence of DVT or SVT in the visualized veins of the bilateral upper extremities. Subcutaneous edema noted throughout right forearm and antecubital area.  *See table(s) above for measurements and observations.  Diagnosing physician: Norman Serve Electronically signed by Norman Serve on 05/06/2024 at 8:32:22 AM.    Final    ECHOCARDIOGRAM COMPLETE Result Date: 05/04/2024    ECHOCARDIOGRAM REPORT   Patient Name:   [name] Date of Exam: 05/04/2024 Medical Rec #:  [id number]       Height:       67.0 in Accession #:    [id number]      Weight:       168.7 lb Date of Birth:  [birth year]        BSA:          1.881 m Patient Age:    78 years        BP:           125/67 mmHg Patient Gender: F               HR:           85 bpm. Exam Location:  Inpatient Procedure: 2D Echo and Intracardiac Opacification Agent (Both Spectral and Color            Flow Doppler were utilized during procedure). Indications:    CHF  History:        Patient has prior history of Echocardiogram examinations. CHF;                 Risk Factors:Hypertension.  Sonographer:    Charmaine Gaskins Referring Phys: [id number] [facility] M Xylah Early  Sonographer Comments: Technically difficult study due to poor echo windows, Technically challenging study due to limited acoustic windows and no subcostal window. IMPRESSIONS  1. Left ventricular ejection fraction, by estimation, is 55 to 60%. Left ventricular ejection fraction by PLAX is 60 %. The left ventricle has normal function. The left ventricle has no regional wall motion abnormalities. Left ventricular diastolic parameters are consistent with Grade I diastolic dysfunction (impaired relaxation).  2. IVC not visualized for RVSP assessment. Right ventricular systolic function is normal. The right ventricular size is mildly enlarged.  3. The mitral valve is normal in structure. No evidence of mitral valve  regurgitation. No evidence of mitral stenosis.  4. The aortic valve is tricuspid. Aortic valve regurgitation is not visualized. Aortic valve sclerosis is present, with no evidence of aortic valve stenosis. Comparison(s): No significant change from prior study. Conclusion(s)/Recommendation(s): Otherwise normal echocardiogram, with minor abnormalities described in the report. FINDINGS  Left Ventricle: Left ventricular ejection fraction, by estimation, is 55 to 60%. Left ventricular ejection fraction by PLAX is 60 %. The left ventricle has normal function. The left ventricle has no regional wall motion abnormalities. Definity  contrast agent was given IV to delineate the left ventricular endocardial borders. The left ventricular internal cavity size was normal in size. There is no left ventricular hypertrophy. Left ventricular diastolic parameters are consistent with Grade I diastolic dysfunction (impaired relaxation). Right Ventricle: IVC not visualized for RVSP assessment. The right ventricular size is mildly enlarged. No increase in right ventricular wall thickness. Right ventricular systolic function is normal. Left Atrium: Left atrial size was normal in size. Right Atrium: Right atrial size was normal in size. Pericardium: There is no evidence of pericardial effusion. Mitral Valve: The mitral valve is normal in structure. No evidence of mitral valve regurgitation. No  evidence of mitral valve stenosis. Tricuspid Valve: The tricuspid valve is normal in structure. Tricuspid valve regurgitation is mild . No evidence of tricuspid stenosis. Aortic Valve: The aortic valve is tricuspid. Aortic valve regurgitation is not visualized. Aortic valve sclerosis is present, with no evidence of aortic valve stenosis. Pulmonic Valve: The pulmonic valve was normal in structure. Pulmonic valve regurgitation is not visualized. No evidence of pulmonic stenosis. Aorta: The aortic root and ascending aorta are structurally normal, with no  evidence of dilitation. Venous: The inferior vena cava was not well visualized. IAS/Shunts: The interatrial septum was not assessed.  LEFT VENTRICLE PLAX 2D LV EF:         Left            Diastology                ventricular     LV e' medial:    6.74 cm/s                ejection        LV E/e' medial:  10.2                fraction by     LV e' lateral:   8.92 cm/s                PLAX is 60      LV E/e' lateral: 7.7                %. LVIDd:         4.10 cm LVIDs:         2.80 cm LV PW:         0.90 cm LV IVS:        0.90 cm LVOT diam:     2.00 cm LVOT Area:     3.14 cm  RIGHT VENTRICLE RV Basal diam:  2.60 cm RV Mid diam:    2.70 cm RV S prime:     19.60 cm/s LEFT ATRIUM             Index LA diam:        2.80 cm 1.49 cm/m LA Vol (A2C):   20.4 ml 10.85 ml/m LA Vol (A4C):   37.2 ml 19.78 ml/m LA Biplane Vol: 29.1 ml 15.47 ml/m   AORTA Ao Root diam: 3.30 cm Ao Asc diam:  3.60 cm MITRAL VALVE               TRICUSPID VALVE MV Area (PHT): 3.65 cm    TR Peak grad:   27.5 mmHg MV Decel Time: 208 msec    TR Vmax:        262.00 cm/s MV E velocity: 68.70 cm/s MV A velocity: 94.90 cm/s  SHUNTS MV E/A ratio:  0.72        Systemic Diam: 2.00 cm Georganna Archer Electronically signed by Georganna Archer Signature Date/Time: 05/04/2024/4:21:31 PM    Final    US  RENAL Result Date: 05/04/2024 EXAM: US  Retroperitoneum Complete, Renal. TECHNIQUE: Real-time ultrasound of the retroperitoneum (complete) with image documentation. CLINICAL HISTORY: Bilateral hydronephrosis. COMPARISON: CT 04/29/2024. FINDINGS: RIGHT KIDNEY: Right Kidney measures 11.0 x 4.2 x 4.6 cm. There is increased echogenicity in the right kidney compatible with medical renal disease. No hydronephrosis, renal stone, or mass visualized. LEFT KIDNEY: The left kidney is not visualized. BLADDER: Foley catheter in the bladder. IMPRESSION: 1. Increased cortical echogenicity in the right kidney compatible with medical renal disease. 2. Left kidney not  visualized.  Electronically signed by: Norman Gatlin MD 05/04/2024 01:53 PM EDT RP Workstation: [id number]   DG Abd Portable 1V Result Date: 05/04/2024 EXAM: 1 VIEW XRAY OF THE ABDOMEN 05/04/2024 07:42:00 AM COMPARISON: 05/02/2024 CLINICAL HISTORY: Constipated. The reason for the exam is constipation. FINDINGS: BOWEL: Nonobstructive bowel gas pattern. Moderate to large stool ball in the rectum, increased from 05/02/2024. SOFT TISSUES: No opaque urinary calculi. Vascular calcifications. BONES: No acute osseous abnormality. Orthopedic fixation hardware in proximal right femur. IMPRESSION: 1. Moderate to large stool ball in the rectum, increased from 05/02/2024. Electronically signed by: Norman Gatlin MD 05/04/2024 01:51 PM EDT RP Workstation: [id number]   DG Abd Portable 1V Result Date: 05/02/2024 EXAM: 1 VIEW XRAY OF THE ABDOMEN 05/02/2024 08:44:29 AM COMPARISON: 04/20/2017, CT 04/29/2024 CLINICAL HISTORY: [id number] Constipated [id number]. Reason for exam - constipation. Pt complains more of left side of abdomen. Constipated [id number]. Reason for exam - constipation. Pt complains more of left side of abdomen. FINDINGS: BOWEL: Moderate stool burden throughout the colon. Nonobstructive bowel gas pattern. SOFT TISSUES: Diffuse vascular calcifications. No opaque urinary calculi. BONES: No acute osseous abnormality. LUNGS: Left lower lobe airspace disease noted. IMPRESSION: 1. Left lower lobe airspace disease. 2. Moderate stool burden throughout the colon, consistent with constipation. 3. Diffuse vascular calcifications. Electronically signed by: Franky Crease MD 05/02/2024 01:23 PM EDT RP Workstation: [id number]   CT Chest W Contrast Result Date: 04/29/2024 EXAM: CT CHEST WITH CONTRAST 04/29/2024 08:51:46 PM TECHNIQUE: CT of the chest was performed with the administration of 75 mL of iohexol (OMNIPAQUE) 350 MG/ML injection. Multiplanar reformatted images are provided for review. Automated exposure control, iterative reconstruction,  and/or weight based adjustment of the mA/kV was utilized to reduce the radiation dose to as low as reasonably achievable. COMPARISON: 12/31/2007 CLINICAL HISTORY: FINDINGS: MEDIASTINUM: Mild cardiomegaly. Three-vessel coronary artery calcifications. Thoracic aortic atherosclerosis. Right pulmonary artery enlargement, suggestive of pulmonary hypertension. The central airways are clear. LYMPH NODES: No mediastinal, hilar or axillary lymphadenopathy. LUNGS AND PLEURA: Small left pleural effusion. Patchy bilateral lower lobe opacities, left greater than right, suggesting atelectasis. Mild patchy opacities in the posterior upper lobes and more likely atelectasis. Possible 8 mm subpleural nodular opacity at the left lung apex (image 27). No pneumothorax. SOFT TISSUES/BONES: No acute abnormality of the bones or soft tissues. UPPER ABDOMEN: Limited images of the upper abdomen demonstrates no acute abnormality. IMPRESSION: 1. Mild cardiomegaly. No frank interstitial edema. 2. Small left pleural effusion. 3. Equivocal 8 mm subpleural nodular opacity at the left lung apex, possibly infectious/inflammatory. Consider follow-up CT chest in 3 months to assess for persistence. 4. Suspected pulmonary arterial hypertension. Electronically signed by: Pinkie Pebbles MD 04/29/2024 09:24 PM EDT RP Workstation: [id number]   CT Angio Abd/Pel W and/or Wo Contrast Result Date: 04/29/2024 EXAM: CTA ABDOMEN AND PELVIS WITH CONTRAST 04/29/2024 08:51:46 PM TECHNIQUE: CTA images of the abdomen and pelvis with intravenous contrast. Three-dimensional MIP/volume rendered formations were performed. Automated exposure control, iterative reconstruction, and/or weight based adjustment of the mA/kV was utilized to reduce the radiation dose to as low as reasonably achievable. COMPARISON: 11/22/2017 CLINICAL HISTORY: FINDINGS: VASCULATURE: AORTA: Atherosclerotic calcifications of the aorta, although patent. No acute finding. No abdominal aortic  aneurysm. No dissection. CELIAC TRUNK: No acute finding. No occlusion or significant stenosis. SUPERIOR MESENTERIC ARTERY: Atherosclerotic calcifications of the origin of the SMA, patent. No acute finding. No occlusion or significant stenosis. RENAL ARTERIES: Atherosclerotic calcifications of the origin of the bilateral renal arteries, patent. No acute finding. No occlusion or significant stenosis.  ILIAC ARTERIES: Atherosclerotic calcifications of the bilateral iliac arteries, patent. No acute finding. No occlusion or significant stenosis. LIVER: The liver is unremarkable. GALLBLADDER AND BILE DUCTS: Layering gallstones, without associated inflammatory changes. No biliary ductal dilatation. SPLEEN: The spleen is unremarkable. PANCREAS: The pancreas is unremarkable. ADRENAL GLANDS: Bilateral adrenal glands demonstrate no acute abnormality. KIDNEYS, URETERS AND BLADDER: Subcentimeter bilateral upper pole renal cysts, benign. No follow-up is recommended. Suspected 6 mm nonobstructing interpolar left renal calculus (image 79). Mild bilateral hydroureteronephrosis, new from remote prior, but possibly reflecting neurogenic bladder / chronic bladder outlet obstruction. No stones in the ureters. No perinephric or periureteral stranding. GI AND BOWEL: Moderate hiatal hernia. Mild rectal fecal impaction, without inflammatory changes to suggest stercoral colitis. No secondary wall thickening or inflammatory changes involving bowel to suggest ischemia. Stomach and duodenal sweep demonstrate no acute abnormality. There is no bowel obstruction. No abnormal bowel wall thickening or distension. REPRODUCTIVE: Status post hysterectomy. PERITONEUM AND RETRPERITONEUM: No ascites or free air. LUNG BASE: No acute abnormality. LYMPH NODES: No lymphadenopathy. BONES AND SOFT TISSUES: Status post ORIF of the right hip. Small fat-containing bilateral inguinal hernias. No acute abnormality of the bones. No acute soft tissue abnormality.  IMPRESSION: 1. Mesenteric vessels are patent.  No findings to suggest mesenteric ischemia. 2. Mild diffuse irregular bladder wall thickening with perivesical stranding, favoring cystitis. Consider cystoscopy for further evaluation, as clinically warranted. 3. Mild bilateral hydroureteronephrosis, possibly reflecting neurogenic bladder or chronic bladder outlet obstruction. 4. 6 mm nonobstructing right renal calculus. Layering tiny bladder calculi measuring up to 2 mm, poorly visualized. Electronically signed by: Pinkie Pebbles MD 04/29/2024 09:07 PM EDT RP Workstation: [id number]   DG Tibia/Fibula Right Result Date: 04/29/2024 CLINICAL DATA:  Leg wound, concern for osteomyelitis. EXAM: RIGHT TIBIA AND FIBULA - 2 VIEW COMPARISON:  10/13/2022. FINDINGS: There is diffusely decreased mineralization of the bones. Fixation hardware is present in the distal femur with old healed fracture deformity. There is bony deformity of the medial tibial plateau, compatible with known old fracture. No periosteal elevation or bony erosions. Vascular calcifications are noted. There is moderate calcaneal spurring. Soft tissue swelling is present at the right lower extremity laterally. IMPRESSION: No radiographic evidence of osteomyelitis Electronically Signed   By: Leita Birmingham M.D.   On: 04/29/2024 17:48   DG Chest Port 1 View Result Date: 04/29/2024 EXAM: 1 VIEW(S) XRAY OF THE CHEST 04/29/2024 04:13:00 PM COMPARISON: 01/19/2024 CLINICAL HISTORY: Suspected sepsis FINDINGS: LUNGS AND PLEURA: Low lung volumes. Streaky opacities in left lung base. No pulmonary edema. No pleural effusion. No pneumothorax. HEART AND MEDIASTINUM: Aortic atherosclerosis. BONES AND SOFT TISSUES: No acute osseous abnormality. Small hiatal hernia. IMPRESSION: 1. Low lung volumes with streaky opacities in the left lung base. Electronically signed by: Norleen Boxer MD 04/29/2024 04:52 PM EDT RP Workstation: [id number]    Microbiology: Results for  orders placed or performed during the hospital encounter of 04/29/24  Culture, blood (Routine x 2)     Status: Abnormal   Collection Time: 04/29/24  3:52 PM   Specimen: BLOOD RIGHT FOREARM  Result Value Ref Range Status   Specimen Description BLOOD RIGHT FOREARM  Final   Special Requests   Final    BOTTLES DRAWN AEROBIC AND ANAEROBIC Blood Culture adequate volume   Culture  Setup Time   Final    IN BOTH AEROBIC AND ANAEROBIC BOTTLES GRAM NEGATIVE RODS CRITICAL RESULT CALLED TO, READ BACK BY AND VERIFIED WITH: PHARMD I.JIMENEZ AT 9170 ON 04/30/2024 BY T.SAAD. Performed at [facility]  [facility], [address], [place], [place] [address]    Culture PROTEUS MIRABILIS (A)  Final   Report Status 05/02/2024 FINAL  Final   Organism ID, Bacteria PROTEUS MIRABILIS  Final      Susceptibility   Proteus mirabilis - MIC*    AMPICILLIN  <=2 SENSITIVE Sensitive     CEFAZOLIN  (NON-URINE) 4 INTERMEDIATE Intermediate     CEFEPIME  <=0.12 SENSITIVE Sensitive     ERTAPENEM <=0.12 SENSITIVE Sensitive     CEFTRIAXONE  <=0.25 SENSITIVE Sensitive     CIPROFLOXACIN  >=4 RESISTANT Resistant     GENTAMICIN <=1 SENSITIVE Sensitive     MEROPENEM 1 SENSITIVE Sensitive     TRIMETH /SULFA  <=20 SENSITIVE Sensitive     AMPICILLIN /SULBACTAM <=2 SENSITIVE Sensitive     PIP/TAZO Value in next row Sensitive      <=4 SENSITIVEThis is a modified FDA-approved test that has been validated and its performance characteristics determined by the reporting laboratory.  This laboratory is certified under the Clinical Laboratory Improvement Amendments CLIA as qualified to perform high complexity clinical laboratory testing.    * PROTEUS MIRABILIS  Blood Culture ID Panel (Reflexed)     Status: Abnormal   Collection Time: 04/29/24  3:52 PM  Result Value Ref Range Status   Enterococcus faecalis NOT DETECTED NOT DETECTED Final   Enterococcus Faecium NOT DETECTED NOT DETECTED Final   Listeria monocytogenes NOT DETECTED NOT DETECTED Final    Staphylococcus species NOT DETECTED NOT DETECTED Final   Staphylococcus aureus (BCID) NOT DETECTED NOT DETECTED Final   Staphylococcus epidermidis NOT DETECTED NOT DETECTED Final   Staphylococcus lugdunensis NOT DETECTED NOT DETECTED Final   Streptococcus species NOT DETECTED NOT DETECTED Final   Streptococcus agalactiae NOT DETECTED NOT DETECTED Final   Streptococcus pneumoniae NOT DETECTED NOT DETECTED Final   Streptococcus pyogenes NOT DETECTED NOT DETECTED Final   A.calcoaceticus-baumannii NOT DETECTED NOT DETECTED Final   Bacteroides fragilis NOT DETECTED NOT DETECTED Final   Enterobacterales DETECTED (A) NOT DETECTED Final    Comment: Enterobacterales represent a large order of gram negative bacteria, not a single organism. CRITICAL RESULT CALLED TO, READ BACK BY AND VERIFIED WITH: PHARMD I.JIMENEZ AT 9170 ON 04/30/2024 BY T.SAAD.    Enterobacter cloacae complex NOT DETECTED NOT DETECTED Final   Escherichia coli NOT DETECTED NOT DETECTED Final   Klebsiella aerogenes NOT DETECTED NOT DETECTED Final   Klebsiella oxytoca NOT DETECTED NOT DETECTED Final   Klebsiella pneumoniae NOT DETECTED NOT DETECTED Final   Proteus species DETECTED (A) NOT DETECTED Final    Comment: CRITICAL RESULT CALLED TO, READ BACK BY AND VERIFIED WITH: PHARMD I.JIMENEZ AT 9170 ON 04/30/2024 BY T.SAAD.    Salmonella species NOT DETECTED NOT DETECTED Final   Serratia marcescens NOT DETECTED NOT DETECTED Final   Haemophilus influenzae NOT DETECTED NOT DETECTED Final   Neisseria meningitidis NOT DETECTED NOT DETECTED Final   Pseudomonas aeruginosa NOT DETECTED NOT DETECTED Final   Stenotrophomonas maltophilia NOT DETECTED NOT DETECTED Final   Candida albicans NOT DETECTED NOT DETECTED Final   Candida auris NOT DETECTED NOT DETECTED Final   Candida glabrata NOT DETECTED NOT DETECTED Final   Candida krusei NOT DETECTED NOT DETECTED Final   Candida parapsilosis NOT DETECTED NOT DETECTED Final   Candida  tropicalis NOT DETECTED NOT DETECTED Final   Cryptococcus neoformans/gattii NOT DETECTED NOT DETECTED Final   CTX-M ESBL NOT DETECTED NOT DETECTED Final   Carbapenem resistance IMP NOT DETECTED NOT DETECTED Final   Carbapenem resistance KPC NOT DETECTED  NOT DETECTED Final   Carbapenem resistance NDM NOT DETECTED NOT DETECTED Final   Carbapenem resist OXA 48 LIKE NOT DETECTED NOT DETECTED Final   Carbapenem resistance VIM NOT DETECTED NOT DETECTED Final    Comment: Performed at [facility], [address], [place], [place] [address]  Resp panel by RT-PCR (RSV, Flu A&B, Covid) Anterior Nasal Swab     Status: None   Collection Time: 04/29/24  5:07 PM   Specimen: Anterior Nasal Swab  Result Value Ref Range Status   SARS Coronavirus 2 by RT PCR NEGATIVE NEGATIVE Final   Influenza A by PCR NEGATIVE NEGATIVE Final   Influenza B by PCR NEGATIVE NEGATIVE Final    Comment: (NOTE) The Xpert Xpress SARS-CoV-2/FLU/RSV plus assay is intended as an aid in the diagnosis of influenza from Nasopharyngeal swab specimens and should not be used as a sole basis for treatment. Nasal washings and aspirates are unacceptable for Xpert Xpress SARS-CoV-2/FLU/RSV testing.  Fact Sheet for Patients: bloggercourse.com  Fact Sheet for Healthcare Providers: seriousbroker.it  This test is not yet approved or cleared by the [place]  FDA and has been authorized for detection and/or diagnosis of SARS-CoV-2 by FDA under an Emergency Use Authorization (EUA). This EUA will remain in effect (meaning this test can be used) for the duration of the COVID-19 declaration under Section 564(b)(1) of the Act, 21 U.S.C. section 360bbb-3(b)(1), unless the authorization is terminated or revoked.     Resp Syncytial Virus by PCR NEGATIVE NEGATIVE Final    Comment: (NOTE) Fact Sheet for Patients: bloggercourse.com  Fact Sheet for Healthcare  Providers: seriousbroker.it  This test is not yet approved or cleared by the [place]  FDA and has been authorized for detection and/or diagnosis of SARS-CoV-2 by FDA under an Emergency Use Authorization (EUA). This EUA will remain in effect (meaning this test can be used) for the duration of the COVID-19 declaration under Section 564(b)(1) of the Act, 21 U.S.C. section 360bbb-3(b)(1), unless the authorization is terminated or revoked.  Performed at [facility], [address], [place], [place] [address]   Culture, blood (Routine x 2)     Status: Abnormal   Collection Time: 04/29/24  5:23 PM   Specimen: BLOOD LEFT ARM  Result Value Ref Range Status   Specimen Description BLOOD LEFT ARM  Final   Special Requests   Final    AEROBIC BOTTLE ONLY Blood Culture results may not be optimal due to an inadequate volume of blood received in culture bottles   Culture  Setup Time   Final    GRAM NEGATIVE RODS BOTTLES DRAWN AEROBIC ONLY CRITICAL VALUE NOTED.  VALUE IS CONSISTENT WITH PREVIOUSLY REPORTED AND CALLED VALUE.    Culture (A)  Final    PROTEUS MIRABILIS SUSCEPTIBILITIES PERFORMED ON PREVIOUS CULTURE WITHIN THE LAST 5 DAYS.    Report Status 05/02/2024 FINAL  Final  Urine Culture     Status: Abnormal   Collection Time: 04/29/24  9:35 PM   Specimen: Urine, Clean Catch  Result Value Ref Range Status   Specimen Description URINE, CLEAN CATCH  Final   Special Requests   Final    NONE Performed at [facility], [address] [place], [place] [address]    Culture (A)  Final    20,000 COLONIES/mL PROTEUS MIRABILIS 20,000 COLONIES/mL ENTEROCOCCUS FAECALIS VANCOMYCIN RESISTANT ENTEROCOCCUS    Report Status 05/02/2024 FINAL  Final   Organism ID, Bacteria PROTEUS MIRABILIS (A)  Final   Organism  ID, Bacteria ENTEROCOCCUS FAECALIS (A)  Final      Susceptibility   Enterococcus faecalis - MIC*    AMPICILLIN  <=2 SENSITIVE Sensitive      NITROFURANTOIN  <=16 SENSITIVE Sensitive     VANCOMYCIN >=32 RESISTANT Resistant     * 20,000 COLONIES/mL ENTEROCOCCUS FAECALIS   Proteus mirabilis - MIC*    AMPICILLIN  <=2 SENSITIVE Sensitive     CEFAZOLIN  (URINE) Value in next row Sensitive      4 SENSITIVEThis is a modified FDA-approved test that has been validated and its performance characteristics determined by the reporting laboratory.  This laboratory is certified under the Clinical Laboratory Improvement Amendments CLIA as qualified to perform high complexity clinical laboratory testing.    CEFEPIME  Value in next row Sensitive      4 SENSITIVEThis is a modified FDA-approved test that has been validated and its performance characteristics determined by the reporting laboratory.  This laboratory is certified under the Clinical Laboratory Improvement Amendments CLIA as qualified to perform high complexity clinical laboratory testing.    ERTAPENEM Value in next row Sensitive      4 SENSITIVEThis is a modified FDA-approved test that has been validated and its performance characteristics determined by the reporting laboratory.  This laboratory is certified under the Clinical Laboratory Improvement Amendments CLIA as qualified to perform high complexity clinical laboratory testing.    CEFTRIAXONE  Value in next row Sensitive      4 SENSITIVEThis is a modified FDA-approved test that has been validated and its performance characteristics determined by the reporting laboratory.  This laboratory is certified under the Clinical Laboratory Improvement Amendments CLIA as qualified to perform high complexity clinical laboratory testing.    CIPROFLOXACIN  Value in next row Resistant      4 SENSITIVEThis is a modified FDA-approved test that has been validated and its performance characteristics determined by the reporting laboratory.  This laboratory is certified under the Clinical Laboratory Improvement Amendments CLIA as qualified to perform high complexity  clinical laboratory testing.    GENTAMICIN Value in next row Sensitive      4 SENSITIVEThis is a modified FDA-approved test that has been validated and its performance characteristics determined by the reporting laboratory.  This laboratory is certified under the Clinical Laboratory Improvement Amendments CLIA as qualified to perform high complexity clinical laboratory testing.    NITROFURANTOIN  Value in next row Resistant      4 SENSITIVEThis is a modified FDA-approved test that has been validated and its performance characteristics determined by the reporting laboratory.  This laboratory is certified under the Clinical Laboratory Improvement Amendments CLIA as qualified to perform high complexity clinical laboratory testing.    TRIMETH /SULFA  Value in next row Sensitive      4 SENSITIVEThis is a modified FDA-approved test that has been validated and its performance characteristics determined by the reporting laboratory.  This laboratory is certified under the Clinical Laboratory Improvement Amendments CLIA as qualified to perform high complexity clinical laboratory testing.    AMPICILLIN /SULBACTAM Value in next row Sensitive      4 SENSITIVEThis is a modified FDA-approved test that has been validated and its performance characteristics determined by the reporting laboratory.  This laboratory is certified under the Clinical Laboratory Improvement Amendments CLIA as qualified to perform high complexity clinical laboratory testing.    PIP/TAZO Value in next row Sensitive      <=4 SENSITIVEThis is a modified FDA-approved test that has been validated and its performance characteristics determined by the reporting laboratory.  This laboratory  is certified under the Clinical Laboratory Improvement Amendments CLIA as qualified to perform high complexity clinical laboratory testing.    MEROPENEM Value in next row Sensitive      <=4 SENSITIVEThis is a modified FDA-approved test that has been validated and its  performance characteristics determined by the reporting laboratory.  This laboratory is certified under the Clinical Laboratory Improvement Amendments CLIA as qualified to perform high complexity clinical laboratory testing.    * 20,000 COLONIES/mL PROTEUS MIRABILIS  MRSA Next Gen by PCR, Nasal     Status: None   Collection Time: 04/30/24  4:25 PM   Specimen: Nasal Mucosa; Nasal Swab  Result Value Ref Range Status   MRSA by PCR Next Gen NOT DETECTED NOT DETECTED Final    Comment: (NOTE) The GeneXpert MRSA Assay (FDA approved for NASAL specimens only), is one component of a comprehensive MRSA colonization surveillance program. It is not intended to diagnose MRSA infection nor to guide or monitor treatment for MRSA infections. Test performance is not FDA approved in patients less than 5 years old. Performed at [facility], [address], [place], [place] [address]    Labs: CBC: Recent Labs  Lab 05/03/24 [phone] 05/04/24 0212 05/05/24 0523  WBC 9.1 9.0 8.2  HGB 9.8* 9.3* 9.2*  HCT 30.2* 29.0* 29.1*  MCV 83.9 85.0 85.6  PLT 119* 150 197   Basic Metabolic Panel: Recent Labs  Lab 05/02/24 0439 05/03/24 0313 05/04/24 0212 05/05/24 0523  NA 140 139 141 141  K 3.8 3.5 3.4* 3.7  CL 108 103 106 107  CO2 21* 24 23 22   GLUCOSE 131* 171* 134* 118*  BUN 21 12 7* 5*  CREATININE 0.93 0.86 0.79 0.85  CALCIUM  8.4* 8.0* 8.2* 8.3*  MG 2.1 1.8 1.8 1.7  PHOS 3.2  --   --   --    Liver Function Tests: Recent Labs  Lab 05/02/24 0439  ALBUMIN 2.2*   CBG: Recent Labs  Lab 05/05/24 1729 05/05/24 2019 05/06/24 0730 05/06/24 1109 05/06/24 1558  GLUCAP 188* 152* 88 157* 158*    Discharge time spent: greater than 30 minutes.  Author: Yetta Blanch, MD  [facility] 05/06/2024

## 2024-05-11 ENCOUNTER — Ambulatory Visit: Payer: Self-pay

## 2024-05-11 NOTE — Telephone Encounter (Signed)
 FYI Only or Action Required?: Action required by provider: clinical question for provider and son is going to try a suppository for patient.  Patient was last seen in primary care on 04/23/2024 by Norleen Lynwood ORN, MD.  Called Nurse Triage reporting Constipation.  Symptoms began Sunday.  Interventions attempted: Prescription medications: Miralax , Ducolax EC  and Rest, hydration, or home remedies.  Symptoms are: unchanged.  Triage Disposition: See Physician Within 24 Hours  Patient/caregiver understands and will follow disposition?: No, wishes to speak with PCP  Message from Kevelyn M sent at 05/11/2024 10:02 AM EST  Reason for Triage: Came home Sunday from a week Hospital stay. Patient had  UTI and became septic  Hasn't had a bowel movement since Sunday. Miralax  and Ducalax and she still hasn't had a bowel movement. Vitals are good. Home health Nurse listened to bowels (Wednesday and today) and sounded good but still no bowel movement. Patient's son is asking what can they do for this. Please advise.  Call back # [phone]    Reason for Disposition  Last bowel movement (BM) > 4 days ago  Answer Assessment - Initial Assessment Questions Son reports patient's hasn't had a bowel movement since Sunday. Reports her normal bowel movement is every three to four days. Son states she has been taking Miralax  and Ducolax EC pill every day with no bowel movement. Son endorses patient is passing gas. Patient does have prescriptions for colace and Ducolax suppository. Educated on the use of both. Recommended Son to try the suppository and to utilize colace as well. Son is asking if suppository isn't successful can he give her the Ducolax pill today. Needing a call back today.   1. STOOL PATTERN OR FREQUENCY: How often do you have a bowel movement (BM)?  (Normal range: 3 times a day to every 3 days)  When was your last BM?       Will go three to four days. Last BM on Sunday 2. STRAINING: Do you  have to strain to have a BM?      Not a lot of straining 3. ONSET: When did the constipation begin?     Started Sunday 4. RECTAL PAIN: Does your rectum hurt when the stool comes out? If Yes, ask: Do you have hemorrhoids? How bad is the pain?  (Scale 1-10; or mild, moderate, severe)     no 5. BM COMPOSITION: Are the stools hard?      no 6. BLOOD ON STOOLS: Has there been any blood on the toilet tissue or on the surface of the BM? If Yes, ask: When was the last time?     no 7. CHRONIC CONSTIPATION: Is this a new problem for you?  If No, ask: How long have you had this problem? (days, weeks, months)      no 8. CHANGES IN DIET OR HYDRATION: Have there been any recent changes in your diet? How much fluids are you drinking on a daily basis?  How much have you had to drink today?     No-son states patient has been drinking plenty of water. 9. MEDICINES: Have you been taking any new medicines? Are you taking any narcotic pain medicines? (e.g., Dilaudid , morphine , Percocet, Vicodin)     No new medications.  10. LAXATIVES: Have you been using any stool softeners, laxatives, or enemas?  If Yes, ask What are you using, how often, and when was the last time?       Has been giving Miralax  and Ducolax EC  pill. Son recommended to try ducolax suppository.  11. ACTIVITY:  How much walking do you do every day?  Has your activity level decreased in the past week?        NA 12. CAUSE: What do you think is causing the constipation?        Recently discharge from the hospital 13. MEDICAL HISTORY: Do you have a history of hemorrhoids, rectal fissures, rectal surgery, or rectal abscess?         no 14. OTHER SYMPTOMS: Do you have any other symptoms? (e.g., abdomen pain, bloating, fever, vomiting)       No  Protocols used: Constipation-A-AH

## 2024-05-11 NOTE — Telephone Encounter (Signed)
 Oh yes, ok to let the son know that if the suppository does not help, taking dulcolox after would be safe and hopefully effective.   Thanks

## 2024-05-12 ENCOUNTER — Other Ambulatory Visit: Payer: Self-pay

## 2024-05-12 ENCOUNTER — Emergency Department (HOSPITAL_COMMUNITY)

## 2024-05-12 ENCOUNTER — Emergency Department (HOSPITAL_COMMUNITY)
Admission: EM | Admit: 2024-05-12 | Discharge: 2024-05-13 | Disposition: A | Discharge status: Home / Self Care | Attending: Emergency Medicine | Admitting: Emergency Medicine

## 2024-05-12 ENCOUNTER — Encounter (HOSPITAL_COMMUNITY): Payer: Self-pay | Admitting: Emergency Medicine

## 2024-05-12 DIAGNOSIS — K5900 Constipation, unspecified: Secondary | ICD-10-CM | POA: Diagnosis not present

## 2024-05-12 DIAGNOSIS — R109 Unspecified abdominal pain: Secondary | ICD-10-CM | POA: Diagnosis present

## 2024-05-12 DIAGNOSIS — R000 Tachycardia, unspecified: Secondary | ICD-10-CM | POA: Insufficient documentation

## 2024-05-12 DIAGNOSIS — Z8673 Personal history of transient ischemic attack (TIA), and cerebral infarction without residual deficits: Secondary | ICD-10-CM | POA: Insufficient documentation

## 2024-05-12 DIAGNOSIS — M25551 Pain in right hip: Secondary | ICD-10-CM | POA: Insufficient documentation

## 2024-05-12 DIAGNOSIS — I509 Heart failure, unspecified: Secondary | ICD-10-CM | POA: Diagnosis not present

## 2024-05-12 DIAGNOSIS — Z79899 Other long term (current) drug therapy: Secondary | ICD-10-CM | POA: Insufficient documentation

## 2024-05-12 DIAGNOSIS — Z7901 Long term (current) use of anticoagulants: Secondary | ICD-10-CM | POA: Diagnosis not present

## 2024-05-12 DIAGNOSIS — I110 Hypertensive heart disease with heart failure: Secondary | ICD-10-CM | POA: Diagnosis not present

## 2024-05-12 DIAGNOSIS — E119 Type 2 diabetes mellitus without complications: Secondary | ICD-10-CM | POA: Insufficient documentation

## 2024-05-12 LAB — URINALYSIS, W/ REFLEX TO CULTURE (INFECTION SUSPECTED)
Bacteria, UA: NONE SEEN
Bilirubin Urine: NEGATIVE
Glucose, UA: NEGATIVE mg/dL
Hgb urine dipstick: NEGATIVE
Ketones, ur: NEGATIVE mg/dL
Nitrite: NEGATIVE
Protein, ur: NEGATIVE mg/dL
Specific Gravity, Urine: 1.015 (ref 1.005–1.030)
pH: 7 (ref 5.0–8.0)

## 2024-05-12 LAB — COMPREHENSIVE METABOLIC PANEL WITH GFR
ALT: 19 U/L (ref 0–44)
AST: 19 U/L (ref 15–41)
Albumin: 3.5 g/dL (ref 3.5–5.0)
Alkaline Phosphatase: 82 U/L (ref 38–126)
Anion gap: 10 (ref 5–15)
BUN: 9 mg/dL (ref 8–23)
CO2: 26 mmol/L (ref 22–32)
Calcium: 9.8 mg/dL (ref 8.9–10.3)
Chloride: 104 mmol/L (ref 98–111)
Creatinine, Ser: 0.77 mg/dL (ref 0.44–1.00)
GFR, Estimated: >60 mL/min (ref >60)
Glucose, Bld: 169 mg/dL — ABNORMAL HIGH (ref 70–99)
Potassium: 4 mmol/L (ref 3.5–5.1)
Sodium: 140 mmol/L (ref 135–145)
Total Bilirubin: 0.4 mg/dL (ref 0.0–1.2)
Total Protein: 7.3 g/dL (ref 6.5–8.1)

## 2024-05-12 LAB — CBC WITH DIFFERENTIAL/PLATELET
Abs Immature Granulocytes: 0.05 K/uL (ref 0.00–0.07)
Basophils Absolute: 0 K/uL (ref 0.0–0.1)
Basophils Relative: 0 %
Eosinophils Absolute: 0.1 K/uL (ref 0.0–0.5)
Eosinophils Relative: 1 %
HCT: 36.4 % (ref 36.0–46.0)
Hemoglobin: 11 g/dL — ABNORMAL LOW (ref 12.0–15.0)
Immature Granulocytes: 1 %
Lymphocytes Relative: 31 %
Lymphs Abs: 2.8 K/uL (ref 0.7–4.0)
MCH: 26.4 pg (ref 26.0–34.0)
MCHC: 30.2 g/dL (ref 30.0–36.0)
MCV: 87.5 fL (ref 80.0–100.0)
Monocytes Absolute: 0.8 K/uL (ref 0.1–1.0)
Monocytes Relative: 9 %
Neutro Abs: 5.3 K/uL (ref 1.7–7.7)
Neutrophils Relative %: 58 %
Platelets: 522 K/uL — ABNORMAL HIGH (ref 150–400)
RBC: 4.16 MIL/uL (ref 3.87–5.11)
RDW: 13.3 % (ref 11.5–15.5)
WBC: 9.1 K/uL (ref 4.0–10.5)
nRBC: 0 % (ref 0.0–0.2)

## 2024-05-12 LAB — I-STAT CG4 LACTIC ACID, ED: Lactic Acid, Venous: 1.5 mmol/L (ref 0.5–1.9)

## 2024-05-12 MED ORDER — IOHEXOL 300 MG/ML  SOLN
100.0000 mL | Freq: Once | INTRAMUSCULAR | Status: AC | PRN
  Administered 2024-05-12: 80 mL via INTRAVENOUS

## 2024-05-12 NOTE — Discharge Instructions (Signed)
 You were seen today for constipation. Please use MiraLAX going forward.  Start with 2 scoops of powder in 32 ounces of fluid.  Escalate to 4 scoops of powder in 32 ounces of fluid 6 hours later.  The next morning you may start with 4 scoops in 32 ounces of fluid and escalate to 8 to scoops in 32 ounces of fluid if you remain unsuccessful in having a bowel movement.  If this is ineffective, you will need to follow back up with your primary care provider.  Once you have had a substantial bowel movement decrease to 2 scoops a day and a minimum of 16 ounces of fluid per scoop.

## 2024-05-12 NOTE — ED Triage Notes (Signed)
 Patient presents from home where she lives with her son due to constipation right leg/ hip pain and increased BP/ HR. She was recently discharged form the hospital post treatment for sepsis. She was taken off of her home BP medication. Since then, her BP has increased as well as her heart rate. Family is concerned and would like this addressed. They believe she may need to go back on her medication. She also has not had a bowel movement since Sunday despite multiple interventions at home. Her Abdomin is tender to touch per EMS. Patient also complains of righ hip and leg pain. She has a rod in that same leg, and family is concerned she may have injured it in a hoyer lift.    EMS vitals: 20 RR 147/90 BP 98% SPO2 on room air 110 HR  98.4 Temp

## 2024-05-12 NOTE — ED Provider Notes (Signed)
 Care of patient received from prior provider at 11:02 PM, please see their note for complete H/P and care plan.  Received handoff per ED course.  Clinical Course as of 05/12/24 2302  Sat May 12, 2024  2301 Stable HO CJT Weak/contsipation CT OK pending UA Pending UA. [CC]    Clinical Course User Index [CC] Jerral Meth, MD    Reassessment: UA negative for acute pathology.     Jerral Meth, MD 05/13/24 [phone]

## 2024-05-12 NOTE — ED Provider Notes (Signed)
 [place] EMERGENCY DEPARTMENT AT [facility] Provider Note   CSN: [id number] Arrival date & time: 05/12/24  1620     Patient presents with: No chief complaint on file.   [name] is a 78 y.o. female.   The history is provided by the patient and medical records. No language interpreter was used.  Constipation Severity:  Severe Time since last bowel movement:  6 days Timing:  Constant Progression:  Unchanged Chronicity:  New Stool description:  None produced Relieved by:  Nothing Worsened by:  Nothing Ineffective treatments:  Miralax  and stool softeners Associated symptoms: abdominal pain   Associated symptoms: no back pain, no diarrhea, no dysuria, no fever, no nausea and no vomiting   Risk factors: recent antibiotic use   Leg Pain Location:  Hip Injury: yes   Mechanism of injury: fall   Mechanism of injury comment:  Per patient fall but no description of what happened Associated symptoms: no back pain, no fatigue, no fever and no neck pain        Prior to Admission medications   Medication Sig Start Date End Date Taking? Authorizing Provider  acetaminophen  (TYLENOL ) 325 MG tablet Take 2 tablets (650 mg total) by mouth every 6 (six) hours as needed. 03/28/24   Dean Clarity, MD  ammonium lactate (LAC-HYDRIN) 12 % lotion Apply topically 2 (two) times daily. Apply to legs 05/06/24   Tobie Yetta HERO, MD  atorvastatin  (LIPITOR) 20 MG tablet Take 1 tablet (20 mg total) by mouth daily. 05/08/24   Norleen Lynwood ORN, MD  bisacodyl (DULCOLAX) 10 MG suppository Place 1 suppository (10 mg total) rectally as needed for moderate constipation. 05/06/24   Patel, Pranav M, MD  bisacodyl (DULCOLAX) 5 MG EC tablet Take 1 tablet (5 mg total) by mouth daily as needed for mild constipation. 05/06/24 05/06/25  Tobie Yetta HERO, MD  diclofenac  Sodium (VOLTAREN ) 1 % GEL Apply 4 g topically 4 (four) times daily. Patient not taking: Reported on 04/29/2024 03/28/24   Dean Clarity, MD   docusate sodium  (COLACE) 100 MG capsule Take 1 capsule (100 mg total) by mouth 2 (two) times daily. 05/06/24   Tobie Yetta HERO, MD  ELIQUIS  5 MG TABS tablet Take 1 tablet (5 mg total) by mouth every 12 (twelve) hours. 05/08/24   Norleen Lynwood ORN, MD  furosemide  (LASIX ) 20 MG tablet Take 1 tablet (20 mg total) by mouth daily as needed for edema (for weight gain of 5 lbs over 1 week). 05/06/24   Patel, Pranav M, MD  gentamicin cream (GARAMYCIN) 0.1 % Apply 1 Application topically daily. Apply to right lower leg wound M-W-F 05/06/24   Tobie Yetta HERO, MD  levETIRAcetam  (KEPPRA ) 500 MG tablet Take 1 tablet (500 mg total) by mouth every 12 (twelve) hours. 05/08/24   Norleen Lynwood ORN, MD  midodrine (PROAMATINE) 2.5 MG tablet Take 1 tablet (2.5 mg total) by mouth 2 (two) times daily with a meal. 05/06/24   Tobie Yetta HERO, MD  polyethylene glycol (MIRALAX  / GLYCOLAX ) 17 g packet Take 17 g by mouth daily. 05/07/24   Tobie Yetta HERO, MD  TRESIBA  FLEXTOUCH 100 UNIT/ML FlexTouch Pen Inject 10 Units into the skin at bedtime. 03/29/24   Towana Ozell BROCKS, MD    Allergies: Porcine (pork) protein-containing drug products    Review of Systems  Constitutional:  Negative for chills, fatigue and fever.  HENT:  Negative for congestion.   Respiratory:  Negative for cough, chest tightness, shortness of breath and  wheezing.   Cardiovascular:  Negative for chest pain.  Gastrointestinal:  Positive for abdominal pain and constipation. Negative for abdominal distention, diarrhea, nausea and vomiting.  Genitourinary:  Negative for dysuria and flank pain.  Musculoskeletal:  Negative for back pain, neck pain and neck stiffness.  Skin:  Negative for rash and wound.  Neurological:  Negative for headaches.  Psychiatric/Behavioral:  Negative for agitation and confusion.   All other systems reviewed and are negative.   Updated Vital Signs BP (!) 129/93 (BP Location: Left Arm)   Pulse (!) 103   Temp 98.1 F (36.7 C) (Oral)   SpO2  93%   Physical Exam Vitals and nursing note reviewed.  Constitutional:      General: She is not in acute distress.    Appearance: She is well-developed. She is not ill-appearing, toxic-appearing or diaphoretic.  HENT:     Head: Normocephalic and atraumatic.     Nose: No congestion or rhinorrhea.     Mouth/Throat:     Pharynx: No oropharyngeal exudate or posterior oropharyngeal erythema.  Eyes:     Extraocular Movements: Extraocular movements intact.     Conjunctiva/sclera: Conjunctivae normal.     Pupils: Pupils are equal, round, and reactive to light.  Cardiovascular:     Rate and Rhythm: Regular rhythm. Tachycardia present.     Heart sounds: No murmur heard. Pulmonary:     Effort: Pulmonary effort is normal. No respiratory distress.     Breath sounds: Normal breath sounds. No wheezing, rhonchi or rales.  Chest:     Chest wall: No tenderness.  Abdominal:     Palpations: Abdomen is soft.     Tenderness: There is abdominal tenderness. There is no right CVA tenderness, left CVA tenderness, guarding or rebound.  Musculoskeletal:        General: Tenderness present. No swelling.     Cervical back: Neck supple.  Skin:    General: Skin is warm and dry.     Capillary Refill: Capillary refill takes less than 2 seconds.     Findings: No erythema or rash.  Neurological:     Mental Status: She is alert.  Psychiatric:        Mood and Affect: Mood normal.     (all labs ordered are listed, but only abnormal results are displayed) Labs Reviewed  CBC WITH DIFFERENTIAL/PLATELET - Abnormal; Notable for the following components:      Result Value   Hemoglobin 11.0 (*)    Platelets 522 (*)    All other components within normal limits  COMPREHENSIVE METABOLIC PANEL WITH GFR - Abnormal; Notable for the following components:   Glucose, Bld 169 (*)    All other components within normal limits  URINALYSIS, W/ REFLEX TO CULTURE (INFECTION SUSPECTED)  I-STAT CG4 LACTIC ACID, ED     EKG: None  Radiology: CT ABDOMEN PELVIS W CONTRAST Result Date: 05/12/2024 EXAM: CT ABDOMEN AND PELVIS WITH CONTRAST 05/12/2024 08:01:51 PM TECHNIQUE: CT of the abdomen and pelvis was performed with the administration of 80 mL of iohexol (OMNIPAQUE) 300 MG/ML solution. Multiplanar reformatted images are provided for review. Automated exposure control, iterative reconstruction, and/or weight-based adjustment of the mA/kV was utilized to reduce the radiation dose to as low as reasonably achievable. COMPARISON: 04/29/2024 CLINICAL HISTORY: No bowel movement for several days with acute abdominal pain. FINDINGS: LOWER CHEST: Small left pleural effusion is noted with minimal atelectatic changes. Atherosclerotic calcifications of the coronary arteries are seen. LIVER: The liver is unremarkable. GALLBLADDER AND BILE  DUCTS: The gallbladder demonstrates dependent density likely related to sludge. No definitive calcified stones are seen. No biliary ductal dilatation. SPLEEN: The spleen is unremarkable. PANCREAS: The pancreas is unremarkable as well. ADRENAL GLANDS: The adrenal glands are unremarkable. KIDNEYS, URETERS AND BLADDER: The kidneys demonstrate a normal enhancement pattern bilaterally. Scattered punctate nonobstructing renal stones are noted bilaterally. Prominence of the ureters is seen although no definitive ureteral stones are noted. This is likely related to the distended bladder. A small amount of air is noted within the bladder which may be related to recent instrumentation. Few tiny stones are again seen within the urinary bladder. GI AND BOWEL: Stomach shows a sliding type hiatal hernia. No obstructive or inflammatory changes of the colon are seen. Scattered fecal material is noted throughout, consistent with the given clinical history. The appendix is not well visualized. No inflammatory changes to suggest appendicitis are seen. Small bowel is within normal limits. PERITONEUM AND RETROPERITONEUM:  No free fluid or free air is seen. VASCULATURE: Aortic calcifications of the aorta are noted without an aneurysmal dilatation. LYMPH NODES: No lymphadenopathy. REPRODUCTIVE ORGANS: The uterus has been surgically removed. BONES AND SOFT TISSUES: Postsurgical changes in the proximal right femur are noted. Degenerative changes of the lumbar spine are seen. Mild scoliosis concave to the left is noted. No compression deformities are seen. IMPRESSION: 1. Retained fecal material, which may represent a degree of colonic constipation. No obstructive changes are seen. 2. Small left pleural effusion with minimal atelectatic changes. 3. Gallbladder sludge without complicating factors. Electronically signed by: Oneil Devonshire MD 05/12/2024 08:11 PM EST RP Workstation: [id number]   DG Hip Unilat W or Wo Pelvis 2-3 Views Right Result Date: 05/12/2024 CLINICAL DATA:  Fall with right hip and thigh pain. Recent sepsis with tachycardia. Concern for infection. EXAM: PORTABLE CHEST 1 VIEW, RIGHT HIP AND FEMUR COMPARISON:  04/29/2024. FINDINGS: Chest: The heart is enlarged and mediastinal contours is stable. There is atherosclerotic calcification of the aorta. Lung volumes are low with mild airspace disease at the left lung base. No effusion or pneumothorax. No acute osseous abnormality. Right hip and femur: There is diffusely decreased mineralization of the bones. No acute fracture or dislocation is seen. Fixation hardware is noted in the right femur without evidence of hardware loosening. An old fracture deformity is noted in the distal femur. Vascular calcifications are present in the pelvis and bilateral lower extremities. IMPRESSION: 1. Mild atelectasis or infiltrate at the left lung base. 2. No evidence of acute fracture at the right hip or femur. Electronically Signed   By: Leita Birmingham M.D.   On: 05/12/2024 18:00   DG Femur Min 2 Views Right Result Date: 05/12/2024 CLINICAL DATA:  Fall with right hip and thigh pain. Recent  sepsis with tachycardia. Concern for infection. EXAM: PORTABLE CHEST 1 VIEW, RIGHT HIP AND FEMUR COMPARISON:  04/29/2024. FINDINGS: Chest: The heart is enlarged and mediastinal contours is stable. There is atherosclerotic calcification of the aorta. Lung volumes are low with mild airspace disease at the left lung base. No effusion or pneumothorax. No acute osseous abnormality. Right hip and femur: There is diffusely decreased mineralization of the bones. No acute fracture or dislocation is seen. Fixation hardware is noted in the right femur without evidence of hardware loosening. An old fracture deformity is noted in the distal femur. Vascular calcifications are present in the pelvis and bilateral lower extremities. IMPRESSION: 1. Mild atelectasis or infiltrate at the left lung base. 2. No evidence of acute fracture at the  right hip or femur. Electronically Signed   By: Leita Birmingham M.D.   On: 05/12/2024 18:00   DG Chest Portable 1 View Result Date: 05/12/2024 CLINICAL DATA:  Fall with right hip and thigh pain. Recent sepsis with tachycardia. Concern for infection. EXAM: PORTABLE CHEST 1 VIEW, RIGHT HIP AND FEMUR COMPARISON:  04/29/2024. FINDINGS: Chest: The heart is enlarged and mediastinal contours is stable. There is atherosclerotic calcification of the aorta. Lung volumes are low with mild airspace disease at the left lung base. No effusion or pneumothorax. No acute osseous abnormality. Right hip and femur: There is diffusely decreased mineralization of the bones. No acute fracture or dislocation is seen. Fixation hardware is noted in the right femur without evidence of hardware loosening. An old fracture deformity is noted in the distal femur. Vascular calcifications are present in the pelvis and bilateral lower extremities. IMPRESSION: 1. Mild atelectasis or infiltrate at the left lung base. 2. No evidence of acute fracture at the right hip or femur. Electronically Signed   By: Leita Birmingham M.D.   On:  05/12/2024 18:00     Procedures   Medications Ordered in the ED  iohexol (OMNIPAQUE) 300 MG/ML solution 100 mL (80 mLs Intravenous Contrast Given 05/12/24 1955)    Clinical Course as of 05/12/24 2320  Sat May 12, 2024  2301 Stable HO CJT Weak/contsipation CT OK pending UA Pending UA. [CC]    Clinical Course User Index [CC] Jerral Meth, MD                                 Medical Decision Making Amount and/or Complexity of Data Reviewed Labs: ordered. Radiology: ordered.  Risk Prescription drug management.    [name] is a 78 y.o. female with past medical history significant for previous stroke with right-sided spastic hemiparesis, epilepsy, hypertension, CHF, diabetes, previous GI bleeds, hyperlipidemia, paroxysmal atrial fibrillation on Eliquis  therapy, diabetes, and recent admission for urinary tract infection and septic shock who presents with abdominal pain, constipation, and right leg pain after possible fall.  According to EMS report to nursing and patient report, she has not a bowel movement 6 days since she was discharged from the hospital for sepsis.  She reports she was having some intermittent abdominal pain and was tender per EMS.  She also may have had some injury with a left causing injury to her right hip or thigh.  She is denying other complaints to me.  She denies fevers, chills, congestion, cough, nausea, or vomiting.  Denies chest pain.  On exam, lungs clear.  Chest nontender.  Abdomen is tender on my exam.  I did hear some bowel sounds.  Patient moving extremities.  Patient has tenderness to her right hip and right thigh and her right leg is wrapped distally.  No other injuries seen initially.  Patient resting.  Patient slightly tachycardic but is afebrile initially.  Will check rectal temperature as family was reportedly concerned as well about her tachycardia.  Given her recent discharge from hospital for sepsis and now tachycardia, we will look for  occult infection.  Will get chest x-ray, and urinalysis.  With her abdominal tenderness discomfort and no bowel movements although she is passing some gas, we will get a CT scan to evaluate.  Will get x-rays of her right hip and right thigh although she was not tender in her knee or distally.  Patient reports she is not having  numbness or weakness compared to her baseline.  Anticipate reassessment after workup to determine disposition.        CT scan returned showing no obstruction.  Labs initially reassuring in regards to CBC and CMP and lactic acid.  Imaging did not show evidence of hip fracture.  Family does not suspect pneumonia based on her symptoms.  The CT also did not show clear pneumonia.  Had discussion with family and we will wait for the results of the urine but anticipate discharge home.  There was no large stool near the vault so we agreed not to attempt disimpaction.  Patient will continue outpatient regimen for bowel management and follow-up with a GI doctor.  They agree with this plan.   Care transferred oncoming team to wait for results of urinalysis.  Anticipate discharge either with or without antibiotics based on urinalysis results.  Care transferred in stable condition.      Final diagnoses:  Constipation, unspecified constipation type  Abdominal pain, unspecified abdominal location  Right hip pain     Clinical Impression: 1. Constipation, unspecified constipation type   2. Abdominal pain, unspecified abdominal location   3. Right hip pain     Disposition: Care transferred to oncoming team to await results of urinalysis and reassessment.  Anticipate discharge home for outpatient follow-up with GI and PCP teams.  This note was prepared with assistance of Conservation officer, historic buildings. Occasional wrong-word or sound-a-like substitutions may have occurred due to the inherent limitations of voice recognition software.      Griselda Tosh, Lonni PARAS, MD 05/12/24  2320

## 2024-05-13 NOTE — ED Notes (Signed)
[facility] Called 

## 2024-05-16 ENCOUNTER — Encounter (HOSPITAL_BASED_OUTPATIENT_CLINIC_OR_DEPARTMENT_OTHER): Attending: Internal Medicine | Admitting: Internal Medicine

## 2024-05-16 DIAGNOSIS — S81801A Unspecified open wound, right lower leg, initial encounter: Secondary | ICD-10-CM | POA: Diagnosis not present

## 2024-05-16 DIAGNOSIS — L89893 Pressure ulcer of other site, stage 3: Secondary | ICD-10-CM

## 2024-05-16 DIAGNOSIS — I87311 Chronic venous hypertension (idiopathic) with ulcer of right lower extremity: Secondary | ICD-10-CM

## 2024-05-16 DIAGNOSIS — E11622 Type 2 diabetes mellitus with other skin ulcer: Secondary | ICD-10-CM

## 2024-05-23 ENCOUNTER — Inpatient Hospital Stay: Admitting: Internal Medicine

## 2024-06-12 ENCOUNTER — Inpatient Hospital Stay: Admitting: Internal Medicine

## 2024-07-02 ENCOUNTER — Encounter: Payer: Self-pay | Admitting: Internal Medicine

## 2024-07-03 ENCOUNTER — Encounter (HOSPITAL_BASED_OUTPATIENT_CLINIC_OR_DEPARTMENT_OTHER): Admitting: Internal Medicine

## 2024-07-06 ENCOUNTER — Other Ambulatory Visit (HOSPITAL_COMMUNITY): Payer: Self-pay

## 2024-07-06 MED ORDER — ELIQUIS 5 MG PO TABS: 5.0000 mg | ORAL_TABLET | Freq: Two times a day (BID) | ORAL | 1 refills | Status: DC

## 2024-07-06 MED ORDER — TRESIBA FLEXTOUCH 100 UNIT/ML ~~LOC~~ SOPN
10.0000 [IU] | PEN_INJECTOR | Freq: Every day | SUBCUTANEOUS | 5 refills | Status: DC
  Filled 2024-07-06: qty 9, 90d supply, fill #0

## 2024-07-06 MED ORDER — LEVETIRACETAM 500 MG PO TABS: 500.0000 mg | ORAL_TABLET | Freq: Two times a day (BID) | ORAL | 1 refills | Status: DC

## 2024-07-06 MED ORDER — LEVETIRACETAM 500 MG PO TABS
500.0000 mg | ORAL_TABLET | Freq: Two times a day (BID) | ORAL | 1 refills | Status: AC
  Filled 2024-07-06 – 2024-07-16 (×2): qty 60, 30d supply, fill #0

## 2024-07-06 MED ORDER — ATORVASTATIN CALCIUM 20 MG PO TABS
20.0000 mg | ORAL_TABLET | Freq: Every day | ORAL | 3 refills | Status: AC
  Filled 2024-07-06 – 2024-07-16 (×2): qty 30, 30d supply, fill #0

## 2024-07-06 MED ORDER — TRESIBA FLEXTOUCH 100 UNIT/ML ~~LOC~~ SOPN: 10.0000 [IU] | PEN_INJECTOR | Freq: Every day | SUBCUTANEOUS | 5 refills | Status: DC

## 2024-07-06 MED ORDER — ATORVASTATIN CALCIUM 20 MG PO TABS: 20.0000 mg | ORAL_TABLET | Freq: Every day | ORAL | 3 refills | Status: DC

## 2024-07-06 MED ORDER — ELIQUIS 5 MG PO TABS
5.0000 mg | ORAL_TABLET | Freq: Two times a day (BID) | ORAL | 1 refills | Status: AC
  Filled 2024-07-06 – 2024-07-16 (×2): qty 60, 30d supply, fill #0

## 2024-07-06 NOTE — Telephone Encounter (Signed)
 Ok for meds to [facility] - done erx

## 2024-07-10 ENCOUNTER — Encounter (HOSPITAL_BASED_OUTPATIENT_CLINIC_OR_DEPARTMENT_OTHER): Attending: Internal Medicine | Admitting: Internal Medicine

## 2024-07-10 ENCOUNTER — Emergency Department (HOSPITAL_COMMUNITY)

## 2024-07-10 ENCOUNTER — Other Ambulatory Visit: Payer: Self-pay

## 2024-07-10 ENCOUNTER — Observation Stay (HOSPITAL_COMMUNITY)
Admission: EM | Admit: 2024-07-10 | Discharge: 2024-07-16 | Disposition: A | Discharge status: Home / Self Care | Source: Ambulatory Visit | Attending: Student | Admitting: Student

## 2024-07-10 DIAGNOSIS — L89899 Pressure ulcer of other site, unspecified stage: Secondary | ICD-10-CM | POA: Diagnosis not present

## 2024-07-10 DIAGNOSIS — E11622 Type 2 diabetes mellitus with other skin ulcer: Secondary | ICD-10-CM | POA: Diagnosis present

## 2024-07-10 DIAGNOSIS — I5032 Chronic diastolic (congestive) heart failure: Secondary | ICD-10-CM | POA: Diagnosis present

## 2024-07-10 DIAGNOSIS — N390 Urinary tract infection, site not specified: Secondary | ICD-10-CM | POA: Diagnosis not present

## 2024-07-10 DIAGNOSIS — G40909 Epilepsy, unspecified, not intractable, without status epilepticus: Secondary | ICD-10-CM | POA: Diagnosis not present

## 2024-07-10 DIAGNOSIS — Z794 Long term (current) use of insulin: Secondary | ICD-10-CM

## 2024-07-10 DIAGNOSIS — R451 Restlessness and agitation: Secondary | ICD-10-CM | POA: Insufficient documentation

## 2024-07-10 DIAGNOSIS — I480 Paroxysmal atrial fibrillation: Secondary | ICD-10-CM | POA: Diagnosis not present

## 2024-07-10 DIAGNOSIS — E785 Hyperlipidemia, unspecified: Secondary | ICD-10-CM | POA: Diagnosis not present

## 2024-07-10 DIAGNOSIS — Z8673 Personal history of transient ischemic attack (TIA), and cerebral infarction without residual deficits: Secondary | ICD-10-CM | POA: Diagnosis not present

## 2024-07-10 DIAGNOSIS — N3000 Acute cystitis without hematuria: Principal | ICD-10-CM | POA: Insufficient documentation

## 2024-07-10 DIAGNOSIS — S81801A Unspecified open wound, right lower leg, initial encounter: Secondary | ICD-10-CM | POA: Insufficient documentation

## 2024-07-10 DIAGNOSIS — Z79899 Other long term (current) drug therapy: Secondary | ICD-10-CM | POA: Insufficient documentation

## 2024-07-10 DIAGNOSIS — X58XXXA Exposure to other specified factors, initial encounter: Secondary | ICD-10-CM | POA: Insufficient documentation

## 2024-07-10 DIAGNOSIS — R531 Weakness: Secondary | ICD-10-CM | POA: Diagnosis not present

## 2024-07-10 DIAGNOSIS — L89893 Pressure ulcer of other site, stage 3: Secondary | ICD-10-CM | POA: Insufficient documentation

## 2024-07-10 DIAGNOSIS — I110 Hypertensive heart disease with heart failure: Secondary | ICD-10-CM | POA: Diagnosis not present

## 2024-07-10 DIAGNOSIS — I87311 Chronic venous hypertension (idiopathic) with ulcer of right lower extremity: Secondary | ICD-10-CM | POA: Insufficient documentation

## 2024-07-10 DIAGNOSIS — I69359 Hemiplegia and hemiparesis following cerebral infarction affecting unspecified side: Secondary | ICD-10-CM

## 2024-07-10 DIAGNOSIS — I69351 Hemiplegia and hemiparesis following cerebral infarction affecting right dominant side: Secondary | ICD-10-CM | POA: Insufficient documentation

## 2024-07-10 DIAGNOSIS — E1165 Type 2 diabetes mellitus with hyperglycemia: Secondary | ICD-10-CM | POA: Diagnosis not present

## 2024-07-10 DIAGNOSIS — R350 Frequency of micturition: Secondary | ICD-10-CM | POA: Diagnosis present

## 2024-07-10 LAB — URINALYSIS, W/ REFLEX TO CULTURE (INFECTION SUSPECTED)
Bilirubin Urine: NEGATIVE
Glucose, UA: NEGATIVE mg/dL
Ketones, ur: NEGATIVE mg/dL
Nitrite: POSITIVE — AB
Protein, ur: NEGATIVE mg/dL
Specific Gravity, Urine: 1.011 (ref 1.005–1.030)
WBC, UA: >50 WBC/hpf (ref 0–5)
pH: 5 (ref 5.0–8.0)

## 2024-07-10 LAB — CBC WITH DIFFERENTIAL/PLATELET
Abs Immature Granulocytes: 0.02 K/uL (ref 0.00–0.07)
Basophils Absolute: 0 K/uL (ref 0.0–0.1)
Basophils Relative: 0 %
Eosinophils Absolute: 0.1 K/uL (ref 0.0–0.5)
Eosinophils Relative: 1 %
HCT: 39.5 % (ref 36.0–46.0)
Hemoglobin: 11.9 g/dL — ABNORMAL LOW (ref 12.0–15.0)
Immature Granulocytes: 0 %
Lymphocytes Relative: 27 %
Lymphs Abs: 2.4 K/uL (ref 0.7–4.0)
MCH: 26.5 pg (ref 26.0–34.0)
MCHC: 30.1 g/dL (ref 30.0–36.0)
MCV: 88 fL (ref 80.0–100.0)
Monocytes Absolute: 0.6 K/uL (ref 0.1–1.0)
Monocytes Relative: 6 %
Neutro Abs: 6 K/uL (ref 1.7–7.7)
Neutrophils Relative %: 66 %
Platelets: 225 K/uL (ref 150–400)
RBC: 4.49 MIL/uL (ref 3.87–5.11)
RDW: 13.8 % (ref 11.5–15.5)
WBC: 9.2 K/uL (ref 4.0–10.5)
nRBC: 0 % (ref 0.0–0.2)

## 2024-07-10 LAB — I-STAT CHEM 8, ED
BUN: 16 mg/dL (ref 8–23)
Calcium, Ion: 1.24 mmol/L (ref 1.15–1.40)
Chloride: 104 mmol/L (ref 98–111)
Creatinine, Ser: 0.9 mg/dL (ref 0.44–1.00)
Glucose, Bld: 187 mg/dL — ABNORMAL HIGH (ref 70–99)
HCT: 36 % (ref 36.0–46.0)
Hemoglobin: 12.2 g/dL (ref 12.0–15.0)
Potassium: 4.1 mmol/L (ref 3.5–5.1)
Sodium: 142 mmol/L (ref 135–145)
TCO2: 24 mmol/L (ref 22–32)

## 2024-07-10 LAB — COMPREHENSIVE METABOLIC PANEL WITH GFR
ALT: 18 U/L (ref 0–44)
AST: 24 U/L (ref 15–41)
Albumin: 3.6 g/dL (ref 3.5–5.0)
Alkaline Phosphatase: 99 U/L (ref 38–126)
Anion gap: 11 (ref 5–15)
BUN: 15 mg/dL (ref 8–23)
CO2: 25 mmol/L (ref 22–32)
Calcium: 9.3 mg/dL (ref 8.9–10.3)
Chloride: 106 mmol/L (ref 98–111)
Creatinine, Ser: 0.83 mg/dL (ref 0.44–1.00)
GFR, Estimated: >60 mL/min
Glucose, Bld: 194 mg/dL — ABNORMAL HIGH (ref 70–99)
Potassium: 4.1 mmol/L (ref 3.5–5.1)
Sodium: 141 mmol/L (ref 135–145)
Total Bilirubin: 0.2 mg/dL (ref 0.0–1.2)
Total Protein: 6.6 g/dL (ref 6.5–8.1)

## 2024-07-10 LAB — GLUCOSE, CAPILLARY: Glucose-Capillary: 200 mg/dL — ABNORMAL HIGH (ref 70–99)

## 2024-07-10 LAB — PROCALCITONIN: Procalcitonin: <0.1 ng/mL

## 2024-07-10 MED ORDER — INSULIN ASPART 100 UNIT/ML IJ SOLN
0.0000 [IU] | Freq: Three times a day (TID) | INTRAMUSCULAR | Status: DC
  Administered 2024-07-11 (×3): 2 [IU] via SUBCUTANEOUS
  Administered 2024-07-12 (×2): 3 [IU] via SUBCUTANEOUS
  Administered 2024-07-13: 2 [IU] via SUBCUTANEOUS
  Administered 2024-07-13 (×2): 3 [IU] via SUBCUTANEOUS
  Administered 2024-07-14: 2 [IU] via SUBCUTANEOUS
  Administered 2024-07-14: 3 [IU] via SUBCUTANEOUS
  Administered 2024-07-15: 2 [IU] via SUBCUTANEOUS
  Administered 2024-07-15: 3 [IU] via SUBCUTANEOUS
  Filled 2024-07-10: qty 2
  Filled 2024-07-10: qty 3
  Filled 2024-07-10: qty 2
  Filled 2024-07-10 (×2): qty 3
  Filled 2024-07-10: qty 2
  Filled 2024-07-10: qty 3
  Filled 2024-07-10 (×3): qty 2
  Filled 2024-07-10 (×2): qty 3

## 2024-07-10 MED ORDER — INSULIN ASPART 100 UNIT/ML IJ SOLN: 0.0000 [IU] | Freq: Every day | INTRAMUSCULAR | Status: DC

## 2024-07-10 MED ORDER — SENNOSIDES-DOCUSATE SODIUM 8.6-50 MG PO TABS: 1.0000 | ORAL_TABLET | Freq: Every evening | ORAL | Status: DC | PRN

## 2024-07-10 MED ORDER — BISACODYL 5 MG PO TBEC: 5.0000 mg | DELAYED_RELEASE_TABLET | Freq: Every day | ORAL | Status: DC | PRN

## 2024-07-10 MED ORDER — ORAL CARE MOUTH RINSE: 15.0000 mL | OROMUCOSAL | Status: DC | PRN

## 2024-07-10 MED ORDER — APIXABAN 5 MG PO TABS
5.0000 mg | ORAL_TABLET | Freq: Two times a day (BID) | ORAL | Status: DC
  Administered 2024-07-10 – 2024-07-16 (×12): 5 mg via ORAL
  Filled 2024-07-10 (×12): qty 1

## 2024-07-10 MED ORDER — ATORVASTATIN CALCIUM 10 MG PO TABS
20.0000 mg | ORAL_TABLET | Freq: Every day | ORAL | Status: DC
  Administered 2024-07-11 – 2024-07-16 (×6): 20 mg via ORAL
  Filled 2024-07-10 (×6): qty 2

## 2024-07-10 MED ORDER — SODIUM CHLORIDE 0.9 % IV SOLN
1.0000 g | INTRAVENOUS | Status: DC
  Administered 2024-07-11 – 2024-07-15 (×5): 1 g via INTRAVENOUS
  Filled 2024-07-10 (×5): qty 10

## 2024-07-10 MED ORDER — ACETAMINOPHEN 650 MG RE SUPP: 650.0000 mg | Freq: Four times a day (QID) | RECTAL | Status: DC | PRN

## 2024-07-10 MED ORDER — INSULIN GLARGINE 100 UNIT/ML ~~LOC~~ SOLN
10.0000 [IU] | Freq: Every day | SUBCUTANEOUS | Status: DC
  Administered 2024-07-10 – 2024-07-15 (×5): 10 [IU] via SUBCUTANEOUS
  Filled 2024-07-10 (×7): qty 0.1

## 2024-07-10 MED ORDER — LEVETIRACETAM 500 MG PO TABS
500.0000 mg | ORAL_TABLET | Freq: Two times a day (BID) | ORAL | Status: DC
  Administered 2024-07-10 – 2024-07-16 (×12): 500 mg via ORAL
  Filled 2024-07-10 (×12): qty 1

## 2024-07-10 MED ORDER — ONDANSETRON HCL 4 MG/2ML IJ SOLN: 4.0000 mg | Freq: Four times a day (QID) | INTRAMUSCULAR | Status: DC | PRN

## 2024-07-10 MED ORDER — ONDANSETRON HCL 4 MG PO TABS: 4.0000 mg | ORAL_TABLET | Freq: Four times a day (QID) | ORAL | Status: DC | PRN

## 2024-07-10 MED ORDER — ACETAMINOPHEN 500 MG PO TABS
1000.0000 mg | ORAL_TABLET | Freq: Once | ORAL | Status: AC
  Administered 2024-07-10: 1000 mg via ORAL
  Filled 2024-07-10: qty 2

## 2024-07-10 MED ORDER — ACETAMINOPHEN 325 MG PO TABS
650.0000 mg | ORAL_TABLET | Freq: Four times a day (QID) | ORAL | Status: DC | PRN
  Administered 2024-07-12 – 2024-07-15 (×2): 650 mg via ORAL
  Filled 2024-07-10 (×2): qty 2

## 2024-07-10 MED ORDER — SODIUM CHLORIDE 0.9 % IV SOLN
1.0000 g | Freq: Once | INTRAVENOUS | Status: AC
  Administered 2024-07-10: 1 g via INTRAVENOUS
  Filled 2024-07-10: qty 10

## 2024-07-10 NOTE — ED Provider Notes (Signed)
 " [name] EMERGENCY DEPARTMENT AT [facility] Provider Note   CSN: [id number] Arrival date & time: 07/10/24  1006     Patient presents with: Urinary Frequency   [name] is a 79 y.o. female.   79 year old female history of UTI, CAD, and stroke with right-sided hemiplegia who presents to the emergency department with mood swings and small swelling urine.  Has been going on for the past week.  Has been getting agitated and cussing at him. Also has been having a headache.  No runny nose sore throat.  No known sick contacts.  No nausea, vomiting, or diarrhea.  Son reports that he took her home in October and feels that he is unable to care for her at home anymore due to her weakness on the right side from her stroke       Prior to Admission medications  Medication Sig Start Date End Date Taking? Authorizing Provider  acetaminophen  (TYLENOL ) 325 MG tablet Take 2 tablets (650 mg total) by mouth every 6 (six) hours as needed. 03/28/24   Dean Clarity, MD  ammonium lactate  (LAC-HYDRIN ) 12 % lotion Apply topically 2 (two) times daily. Apply to legs 05/06/24   Tobie Yetta HERO, MD  atorvastatin  (LIPITOR) 20 MG tablet Take 1 tablet (20 mg total) by mouth daily. 07/06/24   Norleen Lynwood ORN, MD  bisacodyl  (DULCOLAX) 10 MG suppository Place 1 suppository (10 mg total) rectally as needed for moderate constipation. 05/06/24   Tobie Yetta HERO, MD  bisacodyl  (DULCOLAX) 5 MG EC tablet Take 1 tablet (5 mg total) by mouth daily as needed for mild constipation. 05/06/24 05/06/25  Tobie Yetta HERO, MD  diclofenac  Sodium (VOLTAREN ) 1 % GEL Apply 4 g topically 4 (four) times daily. Patient not taking: Reported on 04/29/2024 03/28/24   Dean Clarity, MD  docusate sodium  (COLACE) 100 MG capsule Take 1 capsule (100 mg total) by mouth 2 (two) times daily. 05/06/24   Tobie Yetta HERO, MD  ELIQUIS  5 MG TABS tablet Take 1 tablet (5 mg total) by mouth every 12 (twelve) hours. 07/06/24   Norleen Lynwood ORN, MD   furosemide  (LASIX ) 20 MG tablet Take 1 tablet (20 mg total) by mouth daily as needed for edema (for weight gain of 5 lbs over 1 week). 05/06/24   Tobie Yetta HERO, MD  gentamicin  cream (GARAMYCIN ) 0.1 % Apply 1 Application topically daily. Apply to right lower leg wound M-W-F 05/06/24   Tobie Yetta HERO, MD  levETIRAcetam  (KEPPRA ) 500 MG tablet Take 1 tablet (500 mg total) by mouth every 12 (twelve) hours. 07/06/24   Norleen Lynwood ORN, MD  midodrine  (PROAMATINE ) 2.5 MG tablet Take 1 tablet (2.5 mg total) by mouth 2 (two) times daily with a meal. 05/06/24   Tobie Yetta HERO, MD  polyethylene glycol (MIRALAX  / GLYCOLAX ) 17 g packet Take 17 g by mouth daily. 05/07/24   Tobie Yetta HERO, MD  TRESIBA  FLEXTOUCH 100 UNIT/ML FlexTouch Pen Inject 10 Units into the skin at bedtime. 07/06/24   Norleen Lynwood ORN, MD    Allergies: Porcine (pork) protein-containing drug products    Review of Systems  Updated Vital Signs BP 133/87 (BP Location: Left Arm)   Pulse 87   Temp 98.3 F (36.8 C) (Oral)   Resp 18   SpO2 97%   Physical Exam Vitals and nursing note reviewed.  Constitutional:      General: She is not in acute distress.    Appearance: She is well-developed.  HENT:  Head: Normocephalic and atraumatic.     Right Ear: External ear normal.     Left Ear: External ear normal.     Nose: Nose normal.  Eyes:     Extraocular Movements: Extraocular movements intact.     Conjunctiva/sclera: Conjunctivae normal.     Pupils: Pupils are equal, round, and reactive to light.  Cardiovascular:     Rate and Rhythm: Normal rate and regular rhythm.     Heart sounds: No murmur heard. Pulmonary:     Effort: Pulmonary effort is normal. No respiratory distress.     Breath sounds: No rhonchi.  Musculoskeletal:     Cervical back: Normal range of motion and neck supple.     Right lower leg: Edema present.     Left lower leg: Edema present.  Skin:    General: Skin is warm and dry.  Neurological:     Mental Status: She is  alert. Mental status is at baseline.     Comments: Oriented to self and place.  Did not know the year.  Right-sided upper and lower extremity contractures and weakness from her stroke.  Intact sensation light touch in face, arms, and legs. Full strength of RUE. Decreased strength of LLE which is at baseline per son and patient.  Right-sided facial weakness.  Cranial nerves II through XII otherwise intact  Psychiatric:        Mood and Affect: Mood normal.     (all labs ordered are listed, but only abnormal results are displayed) Labs Reviewed  URINALYSIS, W/ REFLEX TO CULTURE (INFECTION SUSPECTED) - Abnormal; Notable for the following components:      Result Value   APPearance TURBID (*)    Hgb urine dipstick MODERATE (*)    Nitrite POSITIVE (*)    Leukocytes,Ua LARGE (*)    Bacteria, UA MANY (*)    All other components within normal limits  CBC WITH DIFFERENTIAL/PLATELET - Abnormal; Notable for the following components:   Hemoglobin 11.9 (*)    All other components within normal limits  I-STAT CHEM 8, ED - Abnormal; Notable for the following components:   Glucose, Bld 187 (*)    All other components within normal limits  CULTURE, BLOOD (ROUTINE X 2)  CULTURE, BLOOD (ROUTINE X 2)  COMPREHENSIVE METABOLIC PANEL WITH GFR  PROCALCITONIN    EKG: None  Radiology: CT Head Wo Contrast Result Date: 07/10/2024 CLINICAL DATA:  Mood swings and headache. EXAM: CT HEAD WITHOUT CONTRAST TECHNIQUE: Contiguous axial images were obtained from the base of the skull through the vertex without intravenous contrast. RADIATION DOSE REDUCTION: This exam was performed according to the departmental dose-optimization program which includes automated exposure control, adjustment of the mA and/or kV according to patient size and/or use of iterative reconstruction technique. COMPARISON:  December 03, 2017 FINDINGS: Brain: There is generalized cerebral atrophy with widening of the extra-axial spaces and ventricular  dilatation. There are areas of decreased attenuation within the white matter tracts of the supratentorial brain, consistent with microvascular disease changes. A chronic left frontal lobe white matter infarct is seen with ex vacuo dilatation of the left lateral ventricle. Vascular: Moderate severity bilateral cavernous carotid artery calcification is noted. Skull: Normal. Negative for fracture or focal lesion. Sinuses/Orbits: No acute finding. Other: None. IMPRESSION: 1. Generalized cerebral atrophy with chronic white matter small vessel ischemic changes. 2. Chronic left frontal lobe white matter infarct. 3. No acute intracranial abnormality. Electronically Signed   By: Suzen Dials M.D.   On: 07/10/2024 13:20  DG Chest 2 View Result Date: 07/10/2024 EXAM: 2 VIEW(S) XRAY OF THE CHEST 07/10/2024 12:36:00 PM COMPARISON: 05/12/2024 CLINICAL HISTORY: agitaiton FINDINGS: LUNGS AND PLEURA: Low lung volumes. Patchy airspace opacities at left lung base. No pleural effusion. No pneumothorax. HEART AND MEDIASTINUM: Aortic arch atherosclerosis. No acute abnormality of the cardiac and mediastinal silhouettes. BONES AND SOFT TISSUES: Hiatal hernia. No acute osseous abnormality. Osteopenia. Multilevel degenerative changes of the spine. f IMPRESSION: 1. Low lung volumes with patchy airspace opacities at the left lung base, which may represent atelectasis or a developing bronchopneumonia, in the correct clinical context Electronically signed by: Rogelia Myers MD 07/10/2024 01:04 PM EST RP Workstation: [id number]     Procedures   Medications Ordered in the ED  cefTRIAXone  (ROCEPHIN ) 1 g in sodium chloride  0.9 % 100 mL IVPB (has no administration in time range)                                    Medical Decision Making Amount and/or Complexity of Data Reviewed Labs: ordered. Radiology: ordered.  Risk Decision regarding hospitalization.   [name] is a 79 year old female history of UTI, CAD, and  stroke with right-sided hemiplegia who presents to the emergency department with mood swings and small swelling urine.  Initial Ddx:  UTI, ICH, stroke, delirium, pneumonia  MDM/Course:  Patient dents emergency department with agitation and mood swings.  Also has had foul-smelling urine.  Does have a history of UTIs.  On exam no new neurologic deficits but appears chronically ill and weak.  Son is at the bedside and says that he is having difficulty taking care of her at home.  CT head without acute findings.  Chest x-ray with possible left lower lobe infiltrate but she is not have any respiratory symptoms.  Procalcitonin added on to help distinguish if this is pneumonia or not.  Urinalysis does appear to be consistent with UTI.  With her agitation and delirium and general weakness at home we will admit her for treatment.  May benefit from PT and OT and potential placement.  Dr Lou from hospitalist consulted for admission  This patient presents to the ED for concern of complaints listed in HPI, this involves an extensive number of treatment options, and is a complaint that carries with it a high risk of complications and morbidity. Disposition including potential need for admission considered.   Dispo: Admit to Floor  I have reviewed the patients home medications and made adjustments as needed Additional history obtained from son Records reviewed Outpatient Clinic Notes The following labs were independently interpreted: Urinalysis and show urinary tract infection I independently reviewed the following imaging with scope of interpretation limited to determining acute life threatening conditions related to emergency care: Chest x-ray and agree with the radiologist interpretation with the following exceptions: none I personally reviewed and interpreted cardiac monitoring: normal sinus rhythm  I personally reviewed and interpreted the pt's EKG: see above for interpretation  Consults:  Hospitalist Social Determinants of health:  Geriatric  Portions of this note were generated with Scientist, clinical (histocompatibility and immunogenetics). Dictation errors may occur despite best attempts at proofreading.     Final diagnoses:  Acute cystitis without hematuria  Agitation    ED Discharge Orders     None          Yolande Lamar BROCKS, MD 07/10/24 1552  "

## 2024-07-10 NOTE — Plan of Care (Signed)

## 2024-07-10 NOTE — ED Triage Notes (Signed)
 BIB son from home for concern of UTI. Mood swings, headache, odorous urine. Occurred last October as well and was admitted for urosepsis.

## 2024-07-10 NOTE — Progress Notes (Signed)
 PT Cancellation Note  Patient Details Name: [name] MRN: [id number] DOB: 1946/04/05   Cancelled Treatment:    Reason Eval/Treat Not Completed: PT screened, no needs identified, will sign off. Per chart review and discussion with family-pt is total care for mobility-requires hoyer lift for OOB<>chair. Pt appears to be at functional baseline with no acute PT needs. Will sign off.    Dannial SQUIBB, PT Acute Rehabilitation  Office: [phone]

## 2024-07-10 NOTE — Progress Notes (Signed)
 PT Cancellation Note  Patient Details Name: [name] MRN: [id number] DOB: February 23, 1946   Cancelled Treatment:    Reason Eval/Treat Not Completed: Patient at procedure or test/unavailable    Dannial SQUIBB, PT Acute Rehabilitation  Office: [phone]

## 2024-07-10 NOTE — H&P (Addendum)
 " History and Physical  [name] FMW:[id number] DOB: 20-May-1946 DOA: 07/10/2024  PCP: Norleen Lynwood ORN, MD   Chief Complaint: Urinary frequency, foul urine and mood swings  HPI: [name] is a 79 y.o. female with medical history significant for CVA with residual right-sided hemiplegia, paroxysmal A-fib on Eliquis , CAD, T2DM, UTI, HTN, HLD, HFpEF, lymphedema and depression who presented to the ED for evaluation of urinary symptoms. Per son, has been having frequent urination and foul-smelling urine for the last 1 to 2 weeks. He is also started having some mood swings with occasional agitation. Reports patient has been completely bedbound for years due to her stroke he has had trouble caring for her recently. At the bedside, patient endorsing headache but otherwise reports feeling okay with no abdominal pain, dysuria, vomiting, cough, shortness of breath, fever or chills.  Son reports that patient was seen by wound care today and had her right lower extremity pressure ulcer dressed and covered.  ED Course: Initial vitals show patient afebrile, slightly hypertensive with SBP in the 130s to 150s. Initial labs significant for Hgb 11.9, glucose 194 otherwise normal renal function and white count, UA shows moderate hemoglobinuria, positive nitrite, large leuks, WBC >50 and many bacteria. CXR shows low lung volumes with patchy airspace opacities at the left lung base, concerning for atelectasis or developing bronchopneumonia. Pt received IV Rocephin . TRH was consulted for admission.   Review of Systems: Please see HPI for pertinent positives and negatives. A complete 10 system review of systems are otherwise negative.  Past Medical History:  Diagnosis Date   Acute blood loss anemia    CHF (congestive heart failure) (HCC)    EF 50-55%, grade 1 diastolic dysfunction per echo 89/7983   Coronary artery disease involving native artery of transplanted heart without angina pectoris    CVA (cerebral  infarction) 04/2015   Started Plavix  04/2015   Depression    Diabetes (HCC) 2016   Type II. On insulin    Enchondroma of bone 2011   left femur.    Fracture, intertrochanteric, right femur (HCC)    History of lower GI bleeding    Hyperlipidemia    Hypertension    Patent foramen ovale 04/2015   Started on warfarin 04/2015   Protein calorie malnutrition    Stercoral ulcer of rectum 05/16/2015   Past Surgical History:  Procedure Laterality Date   CARDIAC SURGERY     Cath without stent   COLONOSCOPY N/A 05/15/2015   Procedure: COLONOSCOPY;  Surgeon: Gustav Shila GAILS, MD;  Location: [facility] ENDOSCOPY;  Service: Endoscopy;  Laterality: N/A;   FLEXIBLE SIGMOIDOSCOPY N/A 05/15/2015   Procedure: FLEXIBLE SIGMOIDOSCOPY;  Surgeon: Gustav Shila GAILS, MD;  Location: [facility] ENDOSCOPY;  Service: Endoscopy;  Laterality: N/A;  at bedside   INTRAMEDULLARY (IM) NAIL INTERTROCHANTERIC Right 06/12/2015   Procedure: INTRAMEDULLARY (IM) NAIL RIGHT HIP;  Surgeon: Evalene JONETTA Chancy, MD;  Location: [facility] OR;  Service: Orthopedics;  Laterality: Right;   TEE WITHOUT CARDIOVERSION N/A 05/05/2015   Procedure: TRANSESOPHAGEAL ECHOCARDIOGRAM (TEE);  Surgeon: Salena Negri, MD;  Location: [facility] ENDOSCOPY;  Service: Cardiovascular;  Laterality: N/A;   Social History:  reports that she quit smoking about 9 years ago. Her smoking use included cigarettes. She has never used smokeless tobacco. She reports that she does not drink alcohol and does not use drugs.  Allergies[1]  Family History  Problem Relation Age of Onset   Hypertension Mother    Heart failure Mother    Hyperlipidemia Mother  Heart attack Father      Prior to Admission medications  Medication Sig Start Date End Date Taking? Authorizing Provider  acetaminophen  (TYLENOL ) 325 MG tablet Take 2 tablets (650 mg total) by mouth every 6 (six) hours as needed. Patient taking differently: Take 650 mg by mouth every 6 (six) hours as needed (for headaches or mild pain).  03/28/24  Yes Dean Clarity, MD  ammonium lactate  (LAC-HYDRIN ) 12 % lotion Apply topically 2 (two) times daily. Apply to legs Patient taking differently: Apply 1 Application topically 2 (two) times daily as needed for dry skin (legs). 05/06/24  Yes Tobie Yetta HERO, MD  atorvastatin  (LIPITOR) 20 MG tablet Take 1 tablet (20 mg total) by mouth daily. 07/06/24  Yes Norleen Lynwood ORN, MD  diclofenac  Sodium (VOLTAREN ) 1 % GEL Apply 4 g topically 4 (four) times daily. Patient taking differently: Apply 4 g topically 4 (four) times daily as needed (for pain). 03/28/24  Yes Dean Clarity, MD  docusate sodium  (COLACE) 100 MG capsule Take 1 capsule (100 mg total) by mouth 2 (two) times daily. Patient taking differently: Take 100 mg by mouth 2 (two) times daily as needed for mild constipation. 05/06/24  Yes Tobie Yetta HERO, MD  ELIQUIS  5 MG TABS tablet Take 1 tablet (5 mg total) by mouth every 12 (twelve) hours. 07/06/24  Yes Norleen Lynwood ORN, MD  levETIRAcetam  (KEPPRA ) 500 MG tablet Take 1 tablet (500 mg total) by mouth every 12 (twelve) hours. 07/06/24  Yes Norleen Lynwood ORN, MD  polyethylene glycol (MIRALAX  / GLYCOLAX ) 17 g packet Take 17 g by mouth daily. Patient taking differently: Take 17 g by mouth daily as needed for mild constipation. 05/07/24  Yes Tobie Yetta HERO, MD  TRESIBA  FLEXTOUCH 100 UNIT/ML FlexTouch Pen Inject 10 Units into the skin at bedtime. 07/06/24  Yes Norleen Lynwood ORN, MD  bisacodyl  (DULCOLAX) 10 MG suppository Place 1 suppository (10 mg total) rectally as needed for moderate constipation. Patient not taking: Reported on 07/10/2024 05/06/24   Tobie Yetta HERO, MD  bisacodyl  (DULCOLAX) 5 MG EC tablet Take 1 tablet (5 mg total) by mouth daily as needed for mild constipation. Patient not taking: Reported on 07/10/2024 05/06/24 05/06/25  Tobie Yetta HERO, MD  furosemide  (LASIX ) 20 MG tablet Take 1 tablet (20 mg total) by mouth daily as needed for edema (for weight gain of 5 lbs over 1 week). Patient not taking: Reported  on 07/10/2024 05/06/24   Tobie Yetta HERO, MD  gentamicin  cream (GARAMYCIN ) 0.1 % Apply 1 Application topically daily. Apply to right lower leg wound M-W-F Patient not taking: Reported on 07/10/2024 05/06/24   Tobie Yetta HERO, MD  midodrine  (PROAMATINE ) 2.5 MG tablet Take 1 tablet (2.5 mg total) by mouth 2 (two) times daily with a meal. Patient not taking: Reported on 07/10/2024 05/06/24   Tobie Yetta HERO, MD    Physical Exam: BP (!) 152/75 (BP Location: Left Arm)   Pulse 81   Temp (!) 97.5 F (36.4 C) (Oral)   Resp 17   SpO2 98%  General: Pleasant, well-appearing elderly woman laying in bed. No acute distress. HEENT: /AT. Anicteric sclera CV: RRR. No murmurs, rubs, or gallops. No LE edema Pulmonary: Lungs CTAB. Normal effort. No wheezing or rales. Abdominal: Soft, nontender, nondistended. Normal bowel sounds. Extremities: Chronic leg edema. 1+ pedal pulses.  Skin: Warm and dry. Right lower leg pressure ulcer covered with dressing. Neuro: Alert and oriented to self, person and place but not to time. Hemiplegia with chronic  RUE and RLE contractions. Psych: Normal mood and affect          Labs on Admission:  Basic Metabolic Panel: Recent Labs  Lab 07/10/24 1420 07/10/24 1430  NA 141 142  K 4.1 4.1  CL 106 104  CO2 25  --   GLUCOSE 194* 187*  BUN 15 16  CREATININE 0.83 0.90  CALCIUM  9.3  --    Liver Function Tests: Recent Labs  Lab 07/10/24 1420  AST 24  ALT 18  ALKPHOS 99  BILITOT 0.2  PROT 6.6  ALBUMIN 3.6   No results for input(s): LIPASE, AMYLASE in the last 168 hours. No results for input(s): AMMONIA in the last 168 hours. CBC: Recent Labs  Lab 07/10/24 1420 07/10/24 1430  WBC 9.2  --   NEUTROABS 6.0  --   HGB 11.9* 12.2  HCT 39.5 36.0  MCV 88.0  --   PLT 225  --    Cardiac Enzymes: No results for input(s): CKTOTAL, CKMB, CKMBINDEX, TROPONINI in the last 168 hours. BNP (last 3 results) Recent Labs    01/22/24 1854 04/29/24 1710  BNP  81.6 517.4*    ProBNP (last 3 results) No results for input(s): PROBNP in the last 8760 hours.  CBG: No results for input(s): GLUCAP in the last 168 hours.  Radiological Exams on Admission: CT Head Wo Contrast Result Date: 07/10/2024 CLINICAL DATA:  Mood swings and headache. EXAM: CT HEAD WITHOUT CONTRAST TECHNIQUE: Contiguous axial images were obtained from the base of the skull through the vertex without intravenous contrast. RADIATION DOSE REDUCTION: This exam was performed according to the departmental dose-optimization program which includes automated exposure control, adjustment of the mA and/or kV according to patient size and/or use of iterative reconstruction technique. COMPARISON:  December 03, 2017 FINDINGS: Brain: There is generalized cerebral atrophy with widening of the extra-axial spaces and ventricular dilatation. There are areas of decreased attenuation within the white matter tracts of the supratentorial brain, consistent with microvascular disease changes. A chronic left frontal lobe white matter infarct is seen with ex vacuo dilatation of the left lateral ventricle. Vascular: Moderate severity bilateral cavernous carotid artery calcification is noted. Skull: Normal. Negative for fracture or focal lesion. Sinuses/Orbits: No acute finding. Other: None. IMPRESSION: 1. Generalized cerebral atrophy with chronic white matter small vessel ischemic changes. 2. Chronic left frontal lobe white matter infarct. 3. No acute intracranial abnormality. Electronically Signed   By: Suzen Dials M.D.   On: 07/10/2024 13:20   DG Chest 2 View Result Date: 07/10/2024 EXAM: 2 VIEW(S) XRAY OF THE CHEST 07/10/2024 12:36:00 PM COMPARISON: 05/12/2024 CLINICAL HISTORY: agitaiton FINDINGS: LUNGS AND PLEURA: Low lung volumes. Patchy airspace opacities at left lung base. No pleural effusion. No pneumothorax. HEART AND MEDIASTINUM: Aortic arch atherosclerosis. No acute abnormality of the cardiac and mediastinal  silhouettes. BONES AND SOFT TISSUES: Hiatal hernia. No acute osseous abnormality. Osteopenia. Multilevel degenerative changes of the spine. f IMPRESSION: 1. Low lung volumes with patchy airspace opacities at the left lung base, which may represent atelectasis or a developing bronchopneumonia, in the correct clinical context Electronically signed by: Rogelia Myers MD 07/10/2024 01:04 PM EST RP Workstation: [id number]   Assessment/Plan [name] is a 79 y.o. female with medical history significant for CVA with residual right-sided hemiplegia, paroxysmal A-fib on Eliquis , CAD, T2DM, UTI, HTN, HLD, HFpEF, lymphedema and depression who presented to the ED for evaluation of urinary symptoms and admitted for urinary tract infection.  # Acute cystitis - Pt presented  with weeks of frequent urination and foul urine - Urinalysis shows signs of infection - Continue IV rocephin  - F/u urine and blood cultures - Trend CBC and fever curve  # T2DM with hyperglycemia - Last A1c 8.7% 5 months ago, blood glucose in the 180s to 190s on admission - Home regimen includes Tresiba  10 units at bedtime - Start Semglee  10 units at bedtime - SSI with meals with CBG monitoring - Follow-up repeat A1c  # Chronic diastolic HF - Last TTE on 04/2024 showed EF 55-60% and G1DD - Patient has chronic lower extremity edema but euvolemic on exam - Telemetry  # Paroxysmal A-fib - Rate controlled with HR in the 70s to 90s - Continue Eliquis  - Telemetry  # HTN - BP slightly elevated with SBP in the 130s to 150s - Not on any BP meds per med rec  # Hx of CVA - Has a residual right-sided hemiplegia and contractions, bedbound and nonambulatory at home - Continue Eliquis  and atorvastatin   # HLD - Continue atorvastatin   # Seizure disorder - Continue Keppra   # RLE pressure ulcer - Follows with wound care center once a week with last visit earlier today - Continue wound care  # Generalized weakness - In the  setting of her previous stroke and acute urinary infection - TOC consulted to assist with SNF placement - Fall precautions   DVT prophylaxis: Eliquis     Code Status: Full Code  Consults called: None  Family Communication: Discussed results/findings and plan for admission with son at bedside  Severity of Illness: The appropriate patient status for this patient is INPATIENT. Inpatient status is judged to be reasonable and necessary in order to provide the required intensity of service to ensure the patient's safety. The patient's presenting symptoms, physical exam findings, and initial radiographic and laboratory data in the context of their chronic comorbidities is felt to place them at high risk for further clinical deterioration. Furthermore, it is not anticipated that the patient will be medically stable for discharge from the hospital within 2 midnights of admission.   * I certify that at the point of admission it is my clinical judgment that the patient will require inpatient hospital care spanning beyond 2 midnights from the point of admission due to high intensity of service, high risk for further deterioration and high frequency of surveillance required.*  Level of care: Med-Surg   I personally spent a total of 75 minutes in the care of the patient today including preparing to see the patient, getting/reviewing separately obtained history, performing a medically appropriate exam/evaluation, placing orders, documenting clinical information in the EHR, and communicating results.   Lou Claretta HERO, MD 07/10/2024, 7:14 PM [facility] Pager: [phone] Isaiah 41:10   If 7PM-7AM, please contact night-coverage [email or web address] Password [email or web address]     [1]  Allergies Allergen Reactions   Porcine (Pork) Protein-Containing Drug Products Other (See Comments)    Does NOT wish to receive this   "

## 2024-07-11 DIAGNOSIS — N3000 Acute cystitis without hematuria: Secondary | ICD-10-CM | POA: Diagnosis not present

## 2024-07-11 LAB — BASIC METABOLIC PANEL WITH GFR
Anion gap: 12 (ref 5–15)
BUN: 18 mg/dL (ref 8–23)
CO2: 21 mmol/L — ABNORMAL LOW (ref 22–32)
Calcium: 9.5 mg/dL (ref 8.9–10.3)
Chloride: 106 mmol/L (ref 98–111)
Creatinine, Ser: 0.98 mg/dL (ref 0.44–1.00)
GFR, Estimated: 58 mL/min — ABNORMAL LOW
Glucose, Bld: 132 mg/dL — ABNORMAL HIGH (ref 70–99)
Potassium: 4 mmol/L (ref 3.5–5.1)
Sodium: 139 mmol/L (ref 135–145)

## 2024-07-11 LAB — HEMOGLOBIN A1C
Hgb A1c MFr Bld: 6.3 % — ABNORMAL HIGH (ref 4.8–5.6)
Mean Plasma Glucose: 134.11 mg/dL

## 2024-07-11 LAB — GLUCOSE, CAPILLARY
Glucose-Capillary: 138 mg/dL — ABNORMAL HIGH (ref 70–99)
Glucose-Capillary: 135 mg/dL — ABNORMAL HIGH (ref 70–99)
Glucose-Capillary: 175 mg/dL — ABNORMAL HIGH (ref 70–99)
Glucose-Capillary: 128 mg/dL — ABNORMAL HIGH (ref 70–99)

## 2024-07-11 LAB — CBC
HCT: 38.8 % (ref 36.0–46.0)
Hemoglobin: 12 g/dL (ref 12.0–15.0)
MCH: 26.5 pg (ref 26.0–34.0)
MCHC: 30.9 g/dL (ref 30.0–36.0)
MCV: 85.7 fL (ref 80.0–100.0)
Platelets: 206 K/uL (ref 150–400)
RBC: 4.53 MIL/uL (ref 3.87–5.11)
RDW: 13.8 % (ref 11.5–15.5)
WBC: 8.8 K/uL (ref 4.0–10.5)
nRBC: 0 % (ref 0.0–0.2)

## 2024-07-11 NOTE — Plan of Care (Signed)
" °  Problem: Education: Goal: Knowledge of General Education information will improve Description: Including pain rating scale, medication(s)/side effects and non-pharmacologic comfort measures 07/11/2024 2326 by Debora Ou, RN Outcome: Progressing 07/11/2024 2326 by Debora Ou, RN Outcome: Progressing   "

## 2024-07-11 NOTE — Plan of Care (Signed)
   Problem: Education: Goal: Knowledge of General Education information will improve Description Including pain rating scale, medication(s)/side effects and non-pharmacologic comfort measures Outcome: Progressing

## 2024-07-11 NOTE — Progress Notes (Addendum)
 PT informed via secure chat that pt is at her functional baseline and there are no PT recommendations at this time. Pt has been admitted to medical floor. Inpatient ICM team to follow.

## 2024-07-11 NOTE — Plan of Care (Signed)

## 2024-07-11 NOTE — Progress Notes (Signed)
 " PROGRESS NOTE    [name]  FMW:[id number] DOB: 24-Mar-1946 DOA: 07/10/2024 PCP: Norleen Lynwood ORN, MD   Brief Narrative:  HPI: [name] is a 79 y.o. female with medical history significant for CVA with residual right-sided hemiplegia, paroxysmal A-fib on Eliquis , CAD, T2DM, UTI, HTN, HLD, HFpEF, lymphedema and depression who presented to the ED for evaluation of urinary symptoms. Per son, has been having frequent urination and foul-smelling urine for the last 1 to 2 weeks. He is also started having some mood swings with occasional agitation. Reports patient has been completely bedbound for years due to her stroke he has had trouble caring for her recently. At the bedside, patient endorsing headache but otherwise reports feeling okay with no abdominal pain, dysuria, vomiting, cough, shortness of breath, fever or chills.  Son reports that patient was seen by wound care today and had her right lower extremity pressure ulcer dressed and covered.   ED Course: Initial vitals show patient afebrile, slightly hypertensive with SBP in the 130s to 150s. Initial labs significant for Hgb 11.9, glucose 194 otherwise normal renal function and white count, UA shows moderate hemoglobinuria, positive nitrite, large leuks, WBC >50 and many bacteria. CXR shows low lung volumes with patchy airspace opacities at the left lung base, concerning for atelectasis or developing bronchopneumonia. Pt received IV Rocephin . [facility] was consulted for admission.   Assessment & Plan:   Principal Problem:   UTI (urinary tract infection) Active Problems:   History of CVA (cerebrovascular accident)   Chronic diastolic HF (heart failure) (HCC)   Paroxysmal A-fib (HCC)   Flaccid hemiplegia as late effect of cerebral infarction (HCC)   Generalized weakness   Seizure disorder (HCC)  Acute cystitis/UTI Reportedly she was brought in due to frequent urination and foul urine for weeks.  Patient herself is unable to provide me any  history.  She appears comfortable.  UA consistent with UTI, patient started on Rocephin , follow culture.   # T2DM with hyperglycemia - Last A1c 8.7% 5 months ago, currently on Semglee  10 units which is her home dose as well as SSI and blood sugar is controlled.   # Chronic diastolic HF - Last TTE on 04/2024 showed EF 55-60% and G1DD - Patient has chronic lower extremity edema but euvolemic on exam.  She does not appear to be on any diuretics at home.  Monitor on telemetry.   # Paroxysmal A-fib - Rate controlled with HR in the 70s to 90s - Continue Eliquis  - Telemetry   # HTN - BP very well-controlled. - Not on any BP meds per med rec   # Hx of CVA - Has a residual right-sided hemiplegia and contractions, bedbound and nonambulatory at home - Continue Eliquis  and atorvastatin    # HLD - Continue atorvastatin    # Seizure disorder - Continue Keppra    # RLE pressure ulcer - Follows with wound care center once a week with last visit yesterday.   # Generalized weakness - In the setting of her previous stroke and acute urinary infection.  PT OT consulted but per them, patient is at her baseline.  Discussed with the son, he is trying to place her at long-term care facility.  He told me that patient was placed to long-term facility by the hospital about 2-1/2 years ago, patient remained there for 2 years but he discharged her about 5 months ago due to nonhealing right lower extremity wound but now he says that it is too much for him  to care for her and he is unable to provide the care that she needs.  He is looking into talking to child psychotherapist.  TOC consult has been placed.  DVT prophylaxis: Eliquis    Code Status: Full Code  Family Communication:  None present at bedside.  Plan of care discussed with son over the phone.  Status is: Inpatient Remains inpatient appropriate because: Medically stable, son wants to place at long-term facility.   Estimated body mass index is 26.41 kg/m as  calculated from the following:   Height as of 05/01/24: 5' 7 (1.702 m).   Weight as of 05/03/24: 76.5 kg.    Nutritional Assessment: There is no height or weight on file to calculate BMI.. Seen by dietician.  I agree with the assessment and plan as outlined below: Nutrition Status:        . Skin Assessment: I have examined the patient's skin and I agree with the wound assessment as performed by the wound care RN as outlined below:    Consultants:  None  Procedures:  None   Antimicrobials:  Anti-infectives (From admission, onward)    Start     Dose/Rate Route Frequency Ordered Stop   07/11/24 1600  cefTRIAXone  (ROCEPHIN ) 1 g in sodium chloride  0.9 % 100 mL IVPB        1 g 200 mL/hr over 30 Minutes Intravenous Every 24 hours 07/10/24 2052     07/10/24 1545  cefTRIAXone  (ROCEPHIN ) 1 g in sodium chloride  0.9 % 100 mL IVPB        1 g 200 mL/hr over 30 Minutes Intravenous  Once 07/10/24 1537 07/10/24 1701         Subjective: Patient seen and examined, she has no complaints.  She is alert and oriented to self and place.  Objective: Vitals:   07/10/24 1422 07/10/24 1712 07/10/24 2023 07/11/24 0504  BP: 133/87 (!) 152/75 127/79 127/64  Pulse: 87 81 83 81  Resp: 18 17 19 18   Temp: 98.3 F (36.8 C) (!) 97.5 F (36.4 C) 97.9 F (36.6 C) (!) 97.5 F (36.4 C)  TempSrc: Oral Oral Oral Oral  SpO2: 97% 98% 98% 100%    Intake/Output Summary (Last 24 hours) at 07/11/2024 1042 Last data filed at 07/11/2024 0815 Gross per 24 hour  Intake 720 ml  Output --  Net 720 ml   There were no vitals filed for this visit.  Examination:  General exam: Appears calm and comfortable  Respiratory system: Clear to auscultation. Respiratory effort normal. Cardiovascular system: S1 & S2 heard, RRR. No JVD, murmurs, rubs, gallops or clicks. No pedal edema. Gastrointestinal system: Abdomen is nondistended, soft and nontender. No organomegaly or masses felt. Normal bowel sounds  heard. Central nervous system: Alert and oriented x 2.  Hemiaplasia with contractures.   Data Reviewed: I have personally reviewed following labs and imaging studies  CBC: Recent Labs  Lab 07/10/24 1420 07/10/24 1430 07/11/24 0452  WBC 9.2  --  8.8  NEUTROABS 6.0  --   --   HGB 11.9* 12.2 12.0  HCT 39.5 36.0 38.8  MCV 88.0  --  85.7  PLT 225  --  206   Basic Metabolic Panel: Recent Labs  Lab 07/10/24 1420 07/10/24 1430 07/11/24 0452  NA 141 142 139  K 4.1 4.1 4.0  CL 106 104 106  CO2 25  --  21*  GLUCOSE 194* 187* 132*  BUN 15 16 18   CREATININE 0.83 0.90 0.98  CALCIUM  9.3  --  9.5   GFR: CrCl cannot be calculated (Unknown ideal weight.). Liver Function Tests: Recent Labs  Lab 07/10/24 1420  AST 24  ALT 18  ALKPHOS 99  BILITOT 0.2  PROT 6.6  ALBUMIN 3.6   No results for input(s): LIPASE, AMYLASE in the last 168 hours. No results for input(s): AMMONIA in the last 168 hours. Coagulation Profile: No results for input(s): INR, PROTIME in the last 168 hours. Cardiac Enzymes: No results for input(s): CKTOTAL, CKMB, CKMBINDEX, TROPONINI in the last 168 hours. BNP (last 3 results) No results for input(s): PROBNP in the last 8760 hours. HbA1C: Recent Labs    07/11/24 0452  HGBA1C 6.3*   CBG: Recent Labs  Lab 07/10/24 2127 07/11/24 0759  GLUCAP 200* 138*   Lipid Profile: No results for input(s): CHOL, HDL, LDLCALC, TRIG, CHOLHDL, LDLDIRECT in the last 72 hours. Thyroid  Function Tests: No results for input(s): TSH, T4TOTAL, FREET4, T3FREE, THYROIDAB in the last 72 hours. Anemia Panel: No results for input(s): VITAMINB12, FOLATE, FERRITIN, TIBC, IRON, RETICCTPCT in the last 72 hours. Sepsis Labs: Recent Labs  Lab 07/10/24 1924  PROCALCITON <0.10    Recent Results (from the past 240 hours)  Blood culture (routine x 2)     Status: None (Preliminary result)   Collection Time: 07/10/24  2:20 PM    Specimen: BLOOD  Result Value Ref Range Status   Specimen Description   Final    BLOOD LEFT ANTECUBITAL Performed at [facility], [address], [place], [place] [address]    Special Requests   Final    BOTTLES DRAWN AEROBIC AND ANAEROBIC Blood Culture adequate volume Performed at [facility], [address] [place], [place] [address]    Culture   Final    NO GROWTH < 12 HOURS Performed at [facility], [address], [place], [place] [address]    Report Status PENDING  Incomplete  Blood culture (routine x 2)     Status: None (Preliminary result)   Collection Time: 07/10/24  7:24 PM   Specimen: BLOOD LEFT HAND  Result Value Ref Range Status   Specimen Description   Final    BLOOD LEFT HAND Performed at [facility], [address], [place], [place] [address]    Special Requests   Final    BOTTLES DRAWN AEROBIC ONLY Blood Culture adequate volume Performed at [facility], [address] [place], [place] [address]    Culture   Final    NO GROWTH < 12 HOURS Performed at [facility], [address] [place], [place] [address]    Report Status PENDING  Incomplete     Radiology Studies: CT Head Wo Contrast Result Date: 07/10/2024 CLINICAL DATA:  Mood swings and headache. EXAM: CT HEAD WITHOUT CONTRAST TECHNIQUE: Contiguous axial images were obtained from the base of the skull through the vertex without intravenous contrast. RADIATION DOSE REDUCTION: This exam was performed according to the departmental dose-optimization program which includes automated exposure control, adjustment of the mA and/or kV according to patient size and/or use of iterative reconstruction technique. COMPARISON:  December 03, 2017 FINDINGS: Brain: There is generalized cerebral atrophy with widening of the extra-axial spaces and ventricular dilatation. There are areas of decreased attenuation within the white matter tracts of  the supratentorial brain, consistent with microvascular disease changes. A chronic left frontal lobe white matter infarct is seen with ex vacuo dilatation of the left lateral ventricle. Vascular: Moderate severity  bilateral cavernous carotid artery calcification is noted. Skull: Normal. Negative for fracture or focal lesion. Sinuses/Orbits: No acute finding. Other: None. IMPRESSION: 1. Generalized cerebral atrophy with chronic white matter small vessel ischemic changes. 2. Chronic left frontal lobe white matter infarct. 3. No acute intracranial abnormality. Electronically Signed   By: Suzen Dials M.D.   On: 07/10/2024 13:20   DG Chest 2 View Result Date: 07/10/2024 EXAM: 2 VIEW(S) XRAY OF THE CHEST 07/10/2024 12:36:00 PM COMPARISON: 05/12/2024 CLINICAL HISTORY: agitaiton FINDINGS: LUNGS AND PLEURA: Low lung volumes. Patchy airspace opacities at left lung base. No pleural effusion. No pneumothorax. HEART AND MEDIASTINUM: Aortic arch atherosclerosis. No acute abnormality of the cardiac and mediastinal silhouettes. BONES AND SOFT TISSUES: Hiatal hernia. No acute osseous abnormality. Osteopenia. Multilevel degenerative changes of the spine. f IMPRESSION: 1. Low lung volumes with patchy airspace opacities at the left lung base, which may represent atelectasis or a developing bronchopneumonia, in the correct clinical context Electronically signed by: Rogelia Myers MD 07/10/2024 01:04 PM EST RP Workstation: [id number]    Scheduled Meds:  apixaban   5 mg Oral Q12H   atorvastatin   20 mg Oral Daily   insulin  aspart  0-15 Units Subcutaneous TID WC   insulin  aspart  0-5 Units Subcutaneous QHS   insulin  glargine  10 Units Subcutaneous QHS   levETIRAcetam   500 mg Oral Q12H   Continuous Infusions:  cefTRIAXone  (ROCEPHIN )  IV       LOS: 1 day   Fredia Skeeter, MD [facility]  07/11/2024, 10:42 AM   *Please note that this is a verbal dictation therefore any spelling or grammatical errors are due to  the Dragon Medical One system interpretation.  Please page via Amion and do not message via secure chat for urgent patient care matters. Secure chat can be used for non urgent patient care matters.  How to contact the [facility] Attending or Consulting provider 7A - 7P or covering provider during after hours 7P -7A, for this patient?  Check the care team in [facility] and look for a) attending/consulting [facility] provider listed and b) the [facility] team listed. Page or secure chat 7A-7P. Log into [email or web address] and use [facility] universal password to access. If you do not have the password, please contact the hospital operator. Locate the [facility] provider you are looking for under [facility] and page to a number that you can be directly reached. If you still have difficulty reaching the provider, please page the [facility] (Director on Call) for the Hospitalists listed on amion for assistance.  "

## 2024-07-12 DIAGNOSIS — N3000 Acute cystitis without hematuria: Secondary | ICD-10-CM | POA: Diagnosis not present

## 2024-07-12 LAB — GLUCOSE, CAPILLARY
Glucose-Capillary: 98 mg/dL (ref 70–99)
Glucose-Capillary: 165 mg/dL — ABNORMAL HIGH (ref 70–99)
Glucose-Capillary: 185 mg/dL — ABNORMAL HIGH (ref 70–99)
Glucose-Capillary: 61 mg/dL — ABNORMAL LOW (ref 70–99)
Glucose-Capillary: 66 mg/dL — ABNORMAL LOW (ref 70–99)
Glucose-Capillary: 87 mg/dL (ref 70–99)

## 2024-07-12 LAB — URINE CULTURE: Culture: >=100000 — AB

## 2024-07-12 NOTE — Progress Notes (Addendum)
 " PROGRESS NOTE    [name]  FMW:[id number] DOB: February 22, 1946 DOA: 07/10/2024 PCP: Norleen Lynwood ORN, MD   Brief Narrative:  [name] is a 79 y.o. female with medical history significant for CVA with residual right-sided hemiplegia, paroxysmal A-fib on Eliquis , CAD, T2DM, UTI, HTN, HLD, HFpEF, lymphedema and depression who presented to the ED for evaluation of urinary symptoms. Per son, has been having frequent urination and foul-smelling urine for the last 1 to 2 weeks. He is also started having some mood swings with occasional agitation. Reports patient has been completely bedbound for years due to her stroke he has had trouble caring for her recently and would like for her mother to be placed at long-term care.  Patient upon arrival to ED was hemodynamically stable.  UA was consistent with UTI.   Assessment & Plan:   Principal Problem:   UTI (urinary tract infection) Active Problems:   History of CVA (cerebrovascular accident)   Chronic diastolic HF (heart failure) (HCC)   Paroxysmal A-fib (HCC)   Flaccid hemiplegia as late effect of cerebral infarction (HCC)   Generalized weakness   Seizure disorder (HCC)  Acute cystitis/UTI Urine cultures growing Citrobacter farmeri which is sensitive to Rocephin  and patient has been on Rocephin  which I will continue for now.   # T2DM with hyperglycemia - Last A1c 8.7% 5 months ago, currently on Semglee  10 units which is her home dose as well as SSI and blood sugar is controlled.   # Chronic diastolic HF - Last TTE on 04/2024 showed EF 55-60% and G1DD - Patient has chronic lower extremity edema but euvolemic on exam.  She does not appear to be on any diuretics at home.  Monitor on telemetry.   # Paroxysmal A-fib - Rate controlled with HR in the 70s to 90s - Continue Eliquis  - Telemetry   # HTN - BP very well-controlled. - Not on any BP meds per med rec   # Hx of CVA - Has a residual right-sided hemiplegia and contractions, bedbound  and nonambulatory at home - Continue Eliquis  and atorvastatin    # HLD - Continue atorvastatin    # Seizure disorder - Continue Keppra    # RLE pressure ulcer - Follows with wound care center once a week with last visit yesterday.   # Generalized weakness - In the setting of her previous stroke and acute urinary infection.  PT OT consulted but per them, patient is at her baseline.  Discussed with the son, he is requesting to seek help of the social worker to place the patient at long-term care facility.  I reached out to the social worker yesterday relayed this message.  Per son who is at the bedside, he has not heard from child psychotherapist yet.  I have sent another message today.  DVT prophylaxis: Eliquis    Code Status: Full Code  Family Communication: son present at bedside.    Status is: Inpatient Remains inpatient appropriate because: Medically stable, son wants to place at long-term facility.   Estimated body mass index is 26.41 kg/m as calculated from the following:   Height as of 05/01/24: 5' 7 (1.702 m).   Weight as of 05/03/24: 76.5 kg.    Nutritional Assessment: There is no height or weight on file to calculate BMI.. Seen by dietician.  I agree with the assessment and plan as outlined below: Nutrition Status:        . Skin Assessment: I have examined the patient's skin and I agree  with the wound assessment as performed by the wound care RN as outlined below:    Consultants:  None  Procedures:  None   Antimicrobials:  Anti-infectives (From admission, onward)    Start     Dose/Rate Route Frequency Ordered Stop   07/11/24 1600  cefTRIAXone  (ROCEPHIN ) 1 g in sodium chloride  0.9 % 100 mL IVPB        1 g 200 mL/hr over 30 Minutes Intravenous Every 24 hours 07/10/24 2052     07/10/24 1545  cefTRIAXone  (ROCEPHIN ) 1 g in sodium chloride  0.9 % 100 mL IVPB        1 g 200 mL/hr over 30 Minutes Intravenous  Once 07/10/24 1537 07/10/24 1701          Subjective: Patient seen and examined.  She has no complaints at all.  Son at the bedside.  Objective: Vitals:   07/11/24 1706 07/11/24 2127 07/11/24 2130 07/12/24 0521  BP: (!) 132/59 (!) 142/86  134/68  Pulse: (!) 43 79  71  Resp: 17 16  17   Temp: 98.1 F (36.7 C) 98.2 F (36.8 C)  (!) 97.5 F (36.4 C)  TempSrc:  Oral  Oral  SpO2: 99% 90% 94% 100%    Intake/Output Summary (Last 24 hours) at 07/12/2024 1019 Last data filed at 07/12/2024 0730 Gross per 24 hour  Intake 1080 ml  Output 1150 ml  Net -70 ml   There were no vitals filed for this visit.  Examination:  General exam: Appears calm and comfortable  Respiratory system: Clear to auscultation. Respiratory effort normal. Cardiovascular system: S1 & S2 heard, RRR. No JVD, murmurs, rubs, gallops or clicks. No pedal edema. Gastrointestinal system: Abdomen is nondistended, soft and nontender. No organomegaly or masses felt. Normal bowel sounds heard. Central nervous system: Alert and oriented x 2.  Right sided hemiparesis with contractures.   Data Reviewed: I have personally reviewed following labs and imaging studies  CBC: Recent Labs  Lab 07/10/24 1420 07/10/24 1430 07/11/24 0452  WBC 9.2  --  8.8  NEUTROABS 6.0  --   --   HGB 11.9* 12.2 12.0  HCT 39.5 36.0 38.8  MCV 88.0  --  85.7  PLT 225  --  206   Basic Metabolic Panel: Recent Labs  Lab 07/10/24 1420 07/10/24 1430 07/11/24 0452  NA 141 142 139  K 4.1 4.1 4.0  CL 106 104 106  CO2 25  --  21*  GLUCOSE 194* 187* 132*  BUN 15 16 18   CREATININE 0.83 0.90 0.98  CALCIUM  9.3  --  9.5   GFR: CrCl cannot be calculated (Unknown ideal weight.). Liver Function Tests: Recent Labs  Lab 07/10/24 1420  AST 24  ALT 18  ALKPHOS 99  BILITOT 0.2  PROT 6.6  ALBUMIN 3.6   No results for input(s): LIPASE, AMYLASE in the last 168 hours. No results for input(s): AMMONIA in the last 168 hours. Coagulation Profile: No results for input(s): INR,  PROTIME in the last 168 hours. Cardiac Enzymes: No results for input(s): CKTOTAL, CKMB, CKMBINDEX, TROPONINI in the last 168 hours. BNP (last 3 results) No results for input(s): PROBNP in the last 8760 hours. HbA1C: Recent Labs    07/11/24 0452  HGBA1C 6.3*   CBG: Recent Labs  Lab 07/11/24 0759 07/11/24 1232 07/11/24 1700 07/11/24 2128 07/12/24 0744  GLUCAP 138* 135* 175* 128* 98   Lipid Profile: No results for input(s): CHOL, HDL, LDLCALC, TRIG, CHOLHDL, LDLDIRECT in the last 72 hours.  Thyroid  Function Tests: No results for input(s): TSH, T4TOTAL, FREET4, T3FREE, THYROIDAB in the last 72 hours. Anemia Panel: No results for input(s): VITAMINB12, FOLATE, FERRITIN, TIBC, IRON, RETICCTPCT in the last 72 hours. Sepsis Labs: Recent Labs  Lab 07/10/24 1924  PROCALCITON <0.10    Recent Results (from the past 240 hours)  Urine Culture (for pregnant, neutropenic or urologic patients or patients with an indwelling urinary catheter)     Status: Abnormal   Collection Time: 07/10/24  1:37 PM   Specimen: Urine, Clean Catch  Result Value Ref Range Status   Specimen Description   Final    URINE, CLEAN CATCH Performed at [facility], [address], [place], [place] [address]    Special Requests   Final    NONE Performed at [facility], [address] [place], [place] [address]    Culture >=100,000 COLONIES/mL CITROBACTER FARMERI (A)  Final   Report Status 07/12/2024 FINAL  Final   Organism ID, Bacteria CITROBACTER FARMERI (A)  Final      Susceptibility   Citrobacter farmeri - MIC*    CEFEPIME  <=0.12 SENSITIVE Sensitive     ERTAPENEM <=0.12 SENSITIVE Sensitive     CEFTRIAXONE  <=0.25 SENSITIVE Sensitive     CIPROFLOXACIN  <=0.06 SENSITIVE Sensitive     GENTAMICIN  <=1 SENSITIVE Sensitive     NITROFURANTOIN  32 SENSITIVE Sensitive     TRIMETH /SULFA  <=20 SENSITIVE Sensitive     PIP/TAZO Value in  next row Sensitive      <=4 SENSITIVEThis is a modified FDA-approved test that has been validated and its performance characteristics determined by the reporting laboratory.  This laboratory is certified under the Clinical Laboratory Improvement Amendments CLIA as qualified to perform high complexity clinical laboratory testing.    MEROPENEM Value in next row Sensitive      <=4 SENSITIVEThis is a modified FDA-approved test that has been validated and its performance characteristics determined by the reporting laboratory.  This laboratory is certified under the Clinical Laboratory Improvement Amendments CLIA as qualified to perform high complexity clinical laboratory testing.    * >=100,000 COLONIES/mL CITROBACTER FARMERI  Blood culture (routine x 2)     Status: None (Preliminary result)   Collection Time: 07/10/24  2:20 PM   Specimen: BLOOD  Result Value Ref Range Status   Specimen Description   Final    BLOOD LEFT ANTECUBITAL Performed at [facility], [address] [place], [place] [address]    Special Requests   Final    BOTTLES DRAWN AEROBIC AND ANAEROBIC Blood Culture adequate volume Performed at [facility], [address], [place], [place] [address]    Culture   Final    NO GROWTH 2 DAYS Performed at [facility], [address] [place], [place] [address]    Report Status PENDING  Incomplete  Blood culture (routine x 2)     Status: None (Preliminary result)   Collection Time: 07/10/24  7:24 PM   Specimen: BLOOD LEFT HAND  Result Value Ref Range Status   Specimen Description   Final    BLOOD LEFT HAND Performed at [facility], [address], [place], [place] [address]    Special Requests   Final    BOTTLES DRAWN AEROBIC ONLY Blood Culture adequate volume Performed at [facility], [address] [place], [place] [address]    Culture   Final    NO GROWTH 2 DAYS Performed at [facility], [address], [place], [place] [address]    Report Status PENDING  Incomplete     Radiology Studies: CT Head Wo Contrast Result Date: 07/10/2024 CLINICAL DATA:  Mood swings and headache. EXAM: CT HEAD WITHOUT CONTRAST TECHNIQUE: Contiguous axial images were obtained from the base of the skull through the vertex without intravenous contrast. RADIATION DOSE REDUCTION: This exam was performed according to the departmental dose-optimization program which includes automated exposure control, adjustment of the mA and/or kV according to patient size and/or use of iterative reconstruction technique. COMPARISON:  December 03, 2017 FINDINGS: Brain: There is generalized cerebral atrophy with widening of the extra-axial spaces and ventricular dilatation. There are areas of decreased attenuation within the white matter tracts of the supratentorial brain, consistent with microvascular disease changes. A chronic left frontal lobe white matter infarct is seen with ex vacuo dilatation of the left lateral ventricle. Vascular: Moderate severity bilateral cavernous carotid artery calcification is noted. Skull: Normal. Negative for fracture or focal lesion. Sinuses/Orbits: No acute finding. Other: None. IMPRESSION: 1. Generalized cerebral atrophy with chronic white matter small vessel ischemic changes. 2. Chronic left frontal lobe white matter infarct. 3. No acute intracranial abnormality. Electronically Signed   By: Suzen Dials M.D.   On: 07/10/2024 13:20   DG Chest 2 View Result Date: 07/10/2024 EXAM: 2 VIEW(S) XRAY OF THE CHEST 07/10/2024 12:36:00 PM COMPARISON: 05/12/2024 CLINICAL HISTORY: agitaiton FINDINGS: LUNGS AND PLEURA: Low lung volumes. Patchy airspace opacities at left lung base. No pleural effusion. No pneumothorax. HEART AND MEDIASTINUM: Aortic arch atherosclerosis. No acute abnormality of the cardiac and mediastinal silhouettes. BONES AND SOFT TISSUES: Hiatal hernia. No acute osseous abnormality.  Osteopenia. Multilevel degenerative changes of the spine. f IMPRESSION: 1. Low lung volumes with patchy airspace opacities at the left lung base, which may represent atelectasis or a developing bronchopneumonia, in the correct clinical context Electronically signed by: Rogelia Myers MD 07/10/2024 01:04 PM EST RP Workstation: [id number]    Scheduled Meds:  apixaban   5 mg Oral Q12H   atorvastatin   20 mg Oral Daily   insulin  aspart  0-15 Units Subcutaneous TID WC   insulin  aspart  0-5 Units Subcutaneous QHS   insulin  glargine  10 Units Subcutaneous QHS   levETIRAcetam   500 mg Oral Q12H   Continuous Infusions:  cefTRIAXone  (ROCEPHIN )  IV 1 g (07/11/24 1741)     LOS: 2 days   Fredia Skeeter, MD [facility]  07/12/2024, 10:19 AM   *Please note that this is a verbal dictation therefore any spelling or grammatical errors are due to the Dragon Medical One system interpretation.  Please page via Amion and do not message via secure chat for urgent patient care matters. Secure chat can be used for non urgent patient care matters.  How to contact the [facility] Attending or Consulting provider 7A - 7P or covering provider during after hours 7P -7A, for this patient?  Check the care team in [facility] and look for a) attending/consulting TRH provider listed and b) the TR[facility] team listed. Page or secure chat 7A-7P. Log into [email or web address] and use [place]'s universal password to access. If you do not have the password, please contact the hospital operator. Locate the [facility] provider you are looking for under [facility] and page to a number that you can be directly reached. If you still have difficulty reaching the provider, please page the [facility] (Director on Call) for the Hospitalists listed on amion for assistance.  "

## 2024-07-12 NOTE — TOC Initial Note (Addendum)
 Transition of Care [facility]) - Initial/Assessment Note    Patient Details  Name: [name] MRN: [id number] Date of Birth: Nov 01, 1945  [facility] CM/SW Contact:    Doneta Glenys DASEN, RN Phone Number: 07/12/2024, 11:34 AM  Clinical Narrative:      CM sent referral to [place] 12:09 PM CM received call from [facility] requesting CM to call Kathlean Milian to discuss what my be needed for TLC placement.[facility]. CM waiting to reapply. 11:34 AM            CM spoke with patient in the room. CM called [name] Portugal (son) [phone] to discuss LTC placement. CM informed [name] that CM does not place patients in LTC. Encouraged [name] to reach out to [facility] and [email or web address] [name] will return call.  CM following.  Expected Discharge Plan: Home w Home Health Services Barriers to Discharge: Continued Medical Work up   Patient Goals and CMS Choice Patient states their goals for this hospitalization and ongoing recovery are:: Home with son CMS Medicare.gov Compare Post Acute Care list provided to:: Patient Choice offered to / list presented to : Patient [place] ownership interest in [facility].provided to:: Patient    Expected Discharge Plan and Services In-house Referral: NA Discharge Planning Services: CM Consult Post Acute Care Choice: NA Living arrangements for the past 2 months: Single Family Home                 DME Arranged: N/A DME Agency: NA       HH Arranged: NA HH Agency: NA        Prior Living Arrangements/Services Living arrangements for the past 2 months: Single Family Home Lives with:: Adult Children Patient language and need for interpreter reviewed:: Yes Do you feel safe going back to the place where you live?: Yes      Need for Family Participation in Patient Care: Yes (Comment) Care giver support system in place?: Yes (comment) Current home services:  ([facility]) Criminal Activity/Legal  Involvement Pertinent to Current Situation/Hospitalization: No - Comment as needed  Activities of Daily Living      Permission Sought/Granted Permission sought to share information with : Case Manager Permission granted to share information with : Yes, Verbal Permission Granted  Share Information with NAME: Portugal [name] Blades, Emergency Contact  [phone]           Emotional Assessment Appearance:: Appears stated age Attitude/Demeanor/Rapport: Engaged Affect (typically observed): Appropriate Orientation: : Oriented to Self, Oriented to Place Alcohol / Substance Use: Not Applicable Psych Involvement: No (comment)  Admission diagnosis:  Agitation [R45.1] UTI (urinary tract infection) [N39.0] Acute cystitis without hematuria [N30.00] Patient Active Problem List   Diagnosis Date Noted   Flaccid hemiplegia as late effect of cerebral infarction (HCC) 07/10/2024   Generalized weakness 07/10/2024   Seizure disorder (HCC) 07/10/2024   Bacteremia due to Proteus species 05/01/2024   Elevated troponin 04/30/2024   Elevated lactic acid  level 04/30/2024   Nausea and vomiting 04/30/2024   Pleural effusion 04/30/2024   Wound of right leg 04/30/2024   Thrombocytopenia 04/30/2024   Lactic acidosis 04/30/2024   Septic shock (HCC) 04/30/2024   Pressure injury of skin 04/30/2024   Age-related cataract of both eyes 04/23/2024   Urinary tract infection due to Pseudomonas aeruginosa 01/25/2024   Sepsis secondary to UTI (HCC) 01/22/2024   Acute urinary retention 01/22/2024   Chronic diastolic HF (heart failure) (HCC) 07/20/2023  Paroxysmal A-fib (HCC) 07/20/2023   Hemiplegia and hemiparesis following unspecified cerebrovascular disease affecting right dominant side (HCC) 10/21/2022   Localized edema 10/21/2022   Long term (current) use of insulin  (HCC) 10/21/2022   Muscle weakness (generalized) 10/21/2022   Hypokalemia 10/18/2022   Right knee pain 10/13/2022   Hypoglycemia 03/28/2022    Acute metabolic encephalopathy 03/28/2022   UTI (urinary tract infection) 03/28/2022   Encounter for well adult exam with abnormal findings 07/27/2021   HLD (hyperlipidemia) 04/14/2021   Vitamin D  deficiency 04/14/2021   B12 deficiency 04/14/2021   Rupture of left biceps tendon 06/19/2020   Goals of care, counseling/discussion    Palliative care by specialist    DNR (do not resuscitate) discussion    Sepsis without acute organ dysfunction (HCC) 04/05/2020   AKI (acute kidney injury) 04/05/2020   Hyperkalemia 04/05/2020   Abnormal LFTs 04/05/2020   Mild protein malnutrition 04/05/2020   Grade I diastolic dysfunction 04/05/2020   Closed fracture of right distal femur (HCC) 04/05/2020   Prolonged QT interval 04/05/2020   Dysuria 06/12/2019   Preventative health care 08/18/2018   Partial symptomatic epilepsy with complex partial seizures, not intractable, without status epilepticus (HCC) 12/08/2017   Shingles 10/26/2017   Constipation 04/20/2017   Skin ulcer of right heel, limited to breakdown of skin (HCC) 02/10/2017   Muscle spasticity 01/20/2016   Bilateral lower extremity edema 12/23/2015   Acute confusional state 10/28/2015   Right spastic hemiparesis (HCC) 10/07/2015   Right shoulder pain 07/24/2015   Fracture, intertrochanteric, right femur (HCC) 06/10/2015   Stercoral ulcer of rectum 05/16/2015   GI bleed 05/13/2015   BRBPR (bright red blood per rectum) 05/13/2015   CHF (congestive heart failure) (HCC)    Type 2 diabetes mellitus (HCC)    Hypertension    History of CVA (cerebrovascular accident)    Lower GI bleed    Left-sided cerebrovascular accident (CVA) (HCC) 04/28/2015   PCP:  Norleen Lynwood ORN, MD Pharmacy:   [facility] [address] [place]), [place] - [address] [place] ([place]) [place] [address] Phone: [phone] Fax: [phone]     Social Drivers of Health (SDOH) Social History: SDOH Screenings   Food Insecurity: No  Food Insecurity (05/01/2024)  Housing: Low Risk (05/01/2024)  Transportation Needs: No Transportation Needs (05/01/2024)  Utilities: Not At Risk (05/01/2024)  Depression (PHQ2-9): Low Risk (04/23/2024)  Social Connections: Socially Integrated (05/01/2024)  Tobacco Use: Medium Risk (05/12/2024)   SDOH Interventions:     Readmission Risk Interventions    07/12/2024   11:31 AM 05/02/2024   10:07 AM  Readmission Risk Prevention Plan  Transportation Screening Complete Complete  PCP or Specialist Appt within 5-7 Days Complete Complete  Home Care Screening Complete Complete  Medication Review (RN CM) Complete Referral to Pharmacy

## 2024-07-12 NOTE — Plan of Care (Signed)
   Problem: Education: Goal: Knowledge of General Education information will improve Description Including pain rating scale, medication(s)/side effects and non-pharmacologic comfort measures Outcome: Progressing

## 2024-07-12 NOTE — Plan of Care (Signed)

## 2024-07-13 DIAGNOSIS — I5032 Chronic diastolic (congestive) heart failure: Secondary | ICD-10-CM | POA: Diagnosis not present

## 2024-07-13 DIAGNOSIS — I480 Paroxysmal atrial fibrillation: Secondary | ICD-10-CM | POA: Diagnosis not present

## 2024-07-13 DIAGNOSIS — N3000 Acute cystitis without hematuria: Secondary | ICD-10-CM | POA: Diagnosis not present

## 2024-07-13 DIAGNOSIS — Z8673 Personal history of transient ischemic attack (TIA), and cerebral infarction without residual deficits: Secondary | ICD-10-CM | POA: Diagnosis not present

## 2024-07-13 DIAGNOSIS — R531 Weakness: Secondary | ICD-10-CM | POA: Diagnosis not present

## 2024-07-13 DIAGNOSIS — G40909 Epilepsy, unspecified, not intractable, without status epilepticus: Secondary | ICD-10-CM | POA: Diagnosis not present

## 2024-07-13 LAB — GLUCOSE, CAPILLARY
Glucose-Capillary: 122 mg/dL — ABNORMAL HIGH (ref 70–99)
Glucose-Capillary: 152 mg/dL — ABNORMAL HIGH (ref 70–99)
Glucose-Capillary: 164 mg/dL — ABNORMAL HIGH (ref 70–99)
Glucose-Capillary: 167 mg/dL — ABNORMAL HIGH (ref 70–99)
Glucose-Capillary: 208 mg/dL — ABNORMAL HIGH (ref 70–99)

## 2024-07-13 NOTE — Plan of Care (Signed)
   Problem: Education: Goal: Knowledge of General Education information will improve Description: Including pain rating scale, medication(s)/side effects and non-pharmacologic comfort measures Outcome: Progressing   Problem: Clinical Measurements: Goal: Ability to maintain clinical measurements within normal limits will improve Outcome: Progressing

## 2024-07-13 NOTE — Plan of Care (Signed)

## 2024-07-13 NOTE — Progress Notes (Signed)
 Hypoglycemic Event  CBG: 61  Treatment: 8 oz juice/soda  Symptoms: None  Follow-up CBG: Time: 2224 CBG Result:87  Possible Reasons for Event: Inadequate meal intake  Comments/MD notified:Yes    Sollie Vultaggio

## 2024-07-13 NOTE — Progress Notes (Signed)
 " PROGRESS NOTE  TYLIN STRADLEY FMW:[id number] DOB: 04/07/1946   PCP: Norleen Lynwood ORN, MD  Patient is from: Home.  Bedbound at baseline.  Wheelchair dependent for mobility.  DOA: 07/10/2024 LOS: 3  Chief complaints Chief Complaint  Patient presents with   Urinary Frequency     Brief Narrative / Interim history: 79 y.o. female with medical history significant for CVA with residual right-sided hemiplegia, paroxysmal A-fib on Eliquis , CAD, T2DM, UTI, HTN, HLD, HFpEF, lymphedema and depression who presented to the ED for evaluation of urinary symptoms. Per son, has been having frequent urination and foul-smelling urine for the last 1 to 2 weeks. He is also started having some mood swings with occasional agitation. Reports patient has been completely bedbound for years due to her stroke he has had trouble caring for her recently and would like for her mother to be placed at long-term care.  Patient upon arrival to ED was hemodynamically stable.  UA was consistent with UTI.   Subjective: Seen and examined earlier this morning.  Mild hypoglycemia to 60s overnight.  No complaints this morning.  She is awake and alert and oriented to self, place and person but not time.   Assessment and plan: Acute cystitis/UTI: Urine cultures growing Citrobacter farmeri which is sensitive to Rocephin   - Continue IV ceftriaxone  for total of 5 days.    Chronic HFpEF: TTE in 04/2024 with LVEF of 55 to 60% and G1 DD.  Appears euvolemic on exam except for BLE edema/lymphedema.  On p.o. Lasix  as needed at home. -Continue holding Lasix .  Risk for dehydration and AKI. -Strict intake and output   Paroxysmal A-fib: Rate controlled. - Continue home Eliquis   NIDDM-2 with hyperglycemia and level 1 hypoglycemia: A1c 6.3% (was 8.7% about 5 months ago). Recent Labs  Lab 07/12/24 2125 07/12/24 2156 07/12/24 2224 07/13/24 0007 07/13/24 0811  GLUCAP 61* 66* 87 208* 167*  -Continue Semglee  10 units at night. -Continue  SSI-moderate with night coverage.  History of CVA with residual right-sided hemiaplasia and contractures in RUE. Generalized weakness-bedbound and wheelchair dependent at baseline. -Continue home Eliquis  and Lipitor -TOC and family working on [employer] placement  Essential hypertension? Normotensive for most part.  Not on meds at home. -Monitor    Hyperlipidemia - Continue atorvastatin    Seizure disorder: Stable - Continue Keppra    RLE pressure ulcer - Follows with wound care center once a week with last visit yesterday.   There is no height or weight on file to calculate BMI.           DVT prophylaxis:   apixaban  (ELIQUIS ) tablet 5 mg  Code Status: Full code Family Communication: Updated patient's son at bedside Level of care: Med-Surg Status is: Inpatient Remains inpatient appropriate because: UTI   Final disposition: LTC   35 minutes with more than 50% spent in reviewing records, counseling patient/family and coordinating care.  Consultants:  None  Procedures: None  Microbiology summarized: Urine culture with pansensitive Citrobacter farmeri Blood cultures NGTD.  Objective: Vitals:   07/12/24 0521 07/12/24 1331 07/12/24 2044 07/13/24 0516  BP: 134/68 119/69 127/75 (!) 121/102  Pulse: 71 89 79 86  Resp: 17 16 18 18   Temp: (!) 97.5 F (36.4 C) 97.9 F (36.6 C) 97.6 F (36.4 C) (!) 97.3 F (36.3 C)  TempSrc: Oral Oral Oral   SpO2: 100% 99% 99% 100%    Examination:  GENERAL: No apparent distress.  Nontoxic. HEENT: MMM.  Vision and hearing grossly intact.  NECK: Supple.  No apparent JVD.  RESP:  No IWOB.  Fair aeration bilaterally. CVS:  RRR. Heart sounds normal.  ABD/GI/GU: BS+. Abd soft, NTND.  MSK/EXT:  Moves extremities.  BLE weakness.  RUE paressis and contracture.  Lymphedema. NEURO: AA.  Oriented to self, person and place.  RUE paresis and contracture.  BLE weakness. PSYCH: Calm. Normal affect.   Sch Meds:  Scheduled Meds:  apixaban   5  mg Oral Q12H   atorvastatin   20 mg Oral Daily   insulin  aspart  0-15 Units Subcutaneous TID WC   insulin  aspart  0-5 Units Subcutaneous QHS   insulin  glargine  10 Units Subcutaneous QHS   levETIRAcetam   500 mg Oral Q12H   Continuous Infusions:  cefTRIAXone  (ROCEPHIN )  IV 1 g (07/12/24 1756)   PRN Meds:.acetaminophen  **OR** acetaminophen , bisacodyl , ondansetron  **OR** ondansetron  (ZOFRAN ) IV, mouth rinse, senna-docusate  Antimicrobials: Anti-infectives (From admission, onward)    Start     Dose/Rate Route Frequency Ordered Stop   07/11/24 1600  cefTRIAXone  (ROCEPHIN ) 1 g in sodium chloride  0.9 % 100 mL IVPB        1 g 200 mL/hr over 30 Minutes Intravenous Every 24 hours 07/10/24 2052     07/10/24 1545  cefTRIAXone  (ROCEPHIN ) 1 g in sodium chloride  0.9 % 100 mL IVPB        1 g 200 mL/hr over 30 Minutes Intravenous  Once 07/10/24 1537 07/10/24 1701        I have personally reviewed the following labs and images: CBC: Recent Labs  Lab 07/10/24 1420 07/10/24 1430 07/11/24 0452  WBC 9.2  --  8.8  NEUTROABS 6.0  --   --   HGB 11.9* 12.2 12.0  HCT 39.5 36.0 38.8  MCV 88.0  --  85.7  PLT 225  --  206   BMP &GFR Recent Labs  Lab 07/10/24 1420 07/10/24 1430 07/11/24 0452  NA 141 142 139  K 4.1 4.1 4.0  CL 106 104 106  CO2 25  --  21*  GLUCOSE 194* 187* 132*  BUN 15 16 18   CREATININE 0.83 0.90 0.98  CALCIUM  9.3  --  9.5   CrCl cannot be calculated (Unknown ideal weight.). Liver & Pancreas: Recent Labs  Lab 07/10/24 1420  AST 24  ALT 18  ALKPHOS 99  BILITOT 0.2  PROT 6.6  ALBUMIN 3.6   No results for input(s): LIPASE, AMYLASE in the last 168 hours. No results for input(s): AMMONIA in the last 168 hours. Diabetic: Recent Labs    07/11/24 0452  HGBA1C 6.3*   Recent Labs  Lab 07/12/24 2125 07/12/24 2156 07/12/24 2224 07/13/24 0007 07/13/24 0811  GLUCAP 61* 66* 87 208* 167*   Cardiac Enzymes: No results for input(s): CKTOTAL, CKMB,  CKMBINDEX, TROPONINI in the last 168 hours. No results for input(s): PROBNP in the last 8760 hours. Coagulation Profile: No results for input(s): INR, PROTIME in the last 168 hours. Thyroid  Function Tests: No results for input(s): TSH, T4TOTAL, FREET4, T3FREE, THYROIDAB in the last 72 hours. Lipid Profile: No results for input(s): CHOL, HDL, LDLCALC, TRIG, CHOLHDL, LDLDIRECT in the last 72 hours. Anemia Panel: No results for input(s): VITAMINB12, FOLATE, FERRITIN, TIBC, IRON, RETICCTPCT in the last 72 hours. Urine analysis:    Component Value Date/Time   COLORURINE YELLOW 07/10/2024 1337   APPEARANCEUR TURBID (A) 07/10/2024 1337   LABSPEC 1.011 07/10/2024 1337   PHURINE 5.0 07/10/2024 1337   GLUCOSEU NEGATIVE 07/10/2024 1337   GLUCOSEU >1000 mg/dL (A) 90/82/7978 8986  HGBUR MODERATE (A) 07/10/2024 1337   BILIRUBINUR NEGATIVE 07/10/2024 1337   KETONESUR NEGATIVE 07/10/2024 1337   PROTEINUR NEGATIVE 07/10/2024 1337   UROBILINOGEN 0.2 03/21/2020 1013   NITRITE POSITIVE (A) 07/10/2024 1337   LEUKOCYTESUR LARGE (A) 07/10/2024 1337   Sepsis Labs: Invalid input(s): PROCALCITONIN, LACTICIDVEN  Microbiology: Recent Results (from the past 240 hours)  Urine Culture (for pregnant, neutropenic or urologic patients or patients with an indwelling urinary catheter)     Status: Abnormal   Collection Time: 07/10/24  1:37 PM   Specimen: Urine, Clean Catch  Result Value Ref Range Status   Specimen Description   Final    URINE, CLEAN CATCH Performed at [facility], [address], [place], [place] [address]    Special Requests   Final    NONE Performed at [facility], [address] [place], [place] [address]    Culture >=100,000 COLONIES/mL CITROBACTER FARMERI (A)  Final   Report Status 07/12/2024 FINAL  Final   Organism ID, Bacteria CITROBACTER FARMERI (A)  Final      Susceptibility   Citrobacter  farmeri - MIC*    CEFEPIME  <=0.12 SENSITIVE Sensitive     ERTAPENEM <=0.12 SENSITIVE Sensitive     CEFTRIAXONE  <=0.25 SENSITIVE Sensitive     CIPROFLOXACIN  <=0.06 SENSITIVE Sensitive     GENTAMICIN  <=1 SENSITIVE Sensitive     NITROFURANTOIN  32 SENSITIVE Sensitive     TRIMETH /SULFA  <=20 SENSITIVE Sensitive     PIP/TAZO Value in next row Sensitive      <=4 SENSITIVEThis is a modified FDA-approved test that has been validated and its performance characteristics determined by the reporting laboratory.  This laboratory is certified under the Clinical Laboratory Improvement Amendments CLIA as qualified to perform high complexity clinical laboratory testing.    MEROPENEM Value in next row Sensitive      <=4 SENSITIVEThis is a modified FDA-approved test that has been validated and its performance characteristics determined by the reporting laboratory.  This laboratory is certified under the Clinical Laboratory Improvement Amendments CLIA as qualified to perform high complexity clinical laboratory testing.    * >=100,000 COLONIES/mL CITROBACTER FARMERI  Blood culture (routine x 2)     Status: None (Preliminary result)   Collection Time: 07/10/24  2:20 PM   Specimen: BLOOD  Result Value Ref Range Status   Specimen Description   Final    BLOOD LEFT ANTECUBITAL Performed at [facility], [address] [place], [place] [address]    Special Requests   Final    BOTTLES DRAWN AEROBIC AND ANAEROBIC Blood Culture adequate volume Performed at [facility], [address], [place], [place] [address]    Culture   Final    NO GROWTH 3 DAYS Performed at [facility], [address] [place], [place] [address]    Report Status PENDING  Incomplete  Blood culture (routine x 2)     Status: None (Preliminary result)   Collection Time: 07/10/24  7:24 PM   Specimen: BLOOD LEFT HAND  Result Value Ref Range Status   Specimen Description   Final    BLOOD LEFT  HAND Performed at [facility], [address] [place], [place] [address]    Special Requests   Final    BOTTLES DRAWN AEROBIC ONLY Blood Culture adequate volume Performed at [facility], [address] [place], [place] [address]    Culture   Final    NO GROWTH  3 DAYS Performed at [facility], [address] [place], [place] [address]    Report Status PENDING  Incomplete    Radiology Studies: No results found.    Ibrahim Mcpheeters T. Darrian Goodwill [facility]  If 7PM-7AM, please contact night-coverage [email or web address] 07/13/2024, 11:24 AM   "

## 2024-07-13 NOTE — Inpatient Diabetes Management (Signed)
 Inpatient Diabetes Program Recommendations  AACE/ADA: New Consensus Statement on Inpatient Glycemic Control (2015)  Target Ranges:  Prepandial:   less than 140 mg/dL      Peak postprandial:   less than 180 mg/dL (1-2 hours)      Critically ill patients:  140 - 180 mg/dL   Lab Results  Component Value Date   GLUCAP 167 (H) 07/13/2024   HGBA1C 6.3 (H) 07/11/2024    Review of Glycemic Control  Latest Reference Range & Units 07/12/24 07:44 07/12/24 12:12 07/12/24 17:09 07/12/24 21:25 07/12/24 21:56 07/12/24 22:24 07/13/24 00:07 07/13/24 08:11  Glucose-Capillary 70 - 99 mg/dL 98 834 (H)  Novolog  3 units  185 (H)  Novolog  3 units  61 (L) 66 (L) 87 208 (H) 167 (H)  (H): Data is abnormally high (L): Data is abnormally low  Diabetes history: DM2  Outpatient Diabetes medications:  Tresiba  10 units every day  Current orders for Inpatient glycemic control:  Lantus  10 units every day Novolog  0-15 units TID and 0-5 units QHS  Inpatient Diabetes Program Recommendations:    Mild low of 61 mg/dL last evening.  Please consider:  Novolog  0-9 units TID and 0-5 units at bedtime.  Thank you, Wyvonna Pinal, MSN, CDCES Diabetes Coordinator Inpatient Diabetes Program [phone] (team pager from 8a-5p)

## 2024-07-14 DIAGNOSIS — I5032 Chronic diastolic (congestive) heart failure: Secondary | ICD-10-CM | POA: Diagnosis not present

## 2024-07-14 DIAGNOSIS — N3000 Acute cystitis without hematuria: Secondary | ICD-10-CM | POA: Diagnosis not present

## 2024-07-14 DIAGNOSIS — G40909 Epilepsy, unspecified, not intractable, without status epilepticus: Secondary | ICD-10-CM | POA: Diagnosis not present

## 2024-07-14 DIAGNOSIS — Z8673 Personal history of transient ischemic attack (TIA), and cerebral infarction without residual deficits: Secondary | ICD-10-CM | POA: Diagnosis not present

## 2024-07-14 LAB — GLUCOSE, CAPILLARY
Glucose-Capillary: 96 mg/dL (ref 70–99)
Glucose-Capillary: 154 mg/dL — ABNORMAL HIGH (ref 70–99)
Glucose-Capillary: 138 mg/dL — ABNORMAL HIGH (ref 70–99)
Glucose-Capillary: 169 mg/dL — ABNORMAL HIGH (ref 70–99)

## 2024-07-14 MED ORDER — TRESIBA FLEXTOUCH 100 UNIT/ML ~~LOC~~ SOPN: 15.0000 [IU] | PEN_INJECTOR | Freq: Every day | SUBCUTANEOUS | Status: DC

## 2024-07-14 NOTE — Plan of Care (Signed)

## 2024-07-14 NOTE — Progress Notes (Signed)
 " PROGRESS NOTE  [name] FMW:[id number] DOB: August 28, 1945   PCP: Norleen Lynwood ORN, MD  Patient is from: Home.  Bedbound at baseline.  Wheelchair dependent for mobility.  DOA: 07/10/2024 LOS: 4  Chief complaints Chief Complaint  Patient presents with   Urinary Frequency     Brief Narrative / Interim history: 79 y.o. female with medical history significant for CVA with residual right-sided hemiplegia, paroxysmal A-fib on Eliquis , CAD, T2DM, UTI, HTN, HLD, HFpEF, lymphedema and depression who presented to the ED for evaluation of urinary symptoms. Per son, has been having frequent urination and foul-smelling urine for the last 1 to 2 weeks. He is also started having some mood swings with occasional agitation. Reports patient has been completely bedbound for years due to her stroke he has had trouble caring for her recently and would like for her mother to be placed at long-term care.  Patient upon arrival to ED was hemodynamically stable.  UA was consistent with UTI.   Urine culture with pansensitive Citrobacter farmeri.  Continued on IV ceftriaxone .  Medically stable for discharge.  TOC and family working on LTC placement.  Subjective: Seen and examined earlier this morning.  No major events overnight of this morning.  No complaints but not a great historian.   Assessment and plan: Acute cystitis/UTI: Urine cultures growing Citrobacter farmeri which is sensitive to Rocephin   - Continue IV ceftriaxone  for total of 5 days, 1/6-1/10   Chronic HFpEF: TTE in 04/2024 with LVEF of 55 to 60% and G1-DD.  Appears euvolemic on exam except for BLE edema/lymphedema.  On p.o. Lasix  as needed at home. -Continue holding Lasix .  Risk for dehydration and AKI. -Strict intake and output   Paroxysmal A-fib: Rate controlled. - Continue home Eliquis   NIDDM-2 with hyperglycemia and level 1 hypoglycemia: A1c 6.3% (was 8.7% about 5 months ago). Recent Labs  Lab 07/13/24 1208 07/13/24 1745 07/13/24 2146  07/14/24 0735 07/14/24 1144  GLUCAP 164* 152* 122* 96 154*  -Continue Semglee  10 units at night. -Continue SSI-moderate with night coverage.  History of CVA with residual right-sided hemiaplasia and contractures in RUE. Generalized weakness-bedbound and wheelchair dependent at baseline. -Continue home Eliquis  and Lipitor -TOC and family working on [employer] placement  Essential hypertension? Normotensive for most part.  Not on meds at home. -Monitor    Hyperlipidemia - Continue atorvastatin    Seizure disorder: Stable - Continue Keppra    RLE pressure ulcer - Follows with wound care center once a week with last visit yesterday.   There is no height or weight on file to calculate BMI.           DVT prophylaxis:   apixaban  (ELIQUIS ) tablet 5 mg  Code Status: Full code Family Communication: Updated patient's son at bedside Level of care: Med-Surg Status is: Inpatient Remains inpatient appropriate because: UTI   Final disposition: LTC   35 minutes with more than 50% spent in reviewing records, counseling patient/family and coordinating care.  Consultants:  None  Procedures: None  Microbiology summarized: Urine culture with pansensitive Citrobacter farmeri Blood cultures NGTD.  Objective: Vitals:   07/13/24 0516 07/13/24 1211 07/13/24 2023 07/14/24 0553  BP: (!) 121/102 (!) 141/74 (!) 126/93 118/70  Pulse: 86 90 94 87  Resp: 18 20 18 19   Temp: (!) 97.3 F (36.3 C) 97.7 F (36.5 C) 98 F (36.7 C) 98.5 F (36.9 C)  TempSrc:  Oral Oral Oral  SpO2: 100% 100% 98% 95%    Examination:  GENERAL: No apparent  distress.  Nontoxic. HEENT: MMM.  Vision and hearing grossly intact.  NECK: Supple.  No apparent JVD.  RESP:  No IWOB.  Fair aeration bilaterally. CVS:  RRR. Heart sounds normal.  ABD/GI/GU: BS+. Abd soft, NTND.  MSK/EXT:  Moves extremities.  BLE weakness.  RUE paressis and contracture.  Lymphedema. NEURO: AA.  Oriented to self, person and place.  RUE  paresis and contracture.  BLE weakness. PSYCH: Calm. Normal affect.   Sch Meds:  Scheduled Meds:  apixaban   5 mg Oral Q12H   atorvastatin   20 mg Oral Daily   insulin  aspart  0-15 Units Subcutaneous TID WC   insulin  aspart  0-5 Units Subcutaneous QHS   insulin  glargine  10 Units Subcutaneous QHS   levETIRAcetam   500 mg Oral Q12H   Continuous Infusions:  cefTRIAXone  (ROCEPHIN )  IV 1 g (07/13/24 1633)   PRN Meds:.acetaminophen  **OR** acetaminophen , bisacodyl , ondansetron  **OR** ondansetron  (ZOFRAN ) IV, mouth rinse, senna-docusate  Antimicrobials: Anti-infectives (From admission, onward)    Start     Dose/Rate Route Frequency Ordered Stop   07/11/24 1600  cefTRIAXone  (ROCEPHIN ) 1 g in sodium chloride  0.9 % 100 mL IVPB        1 g 200 mL/hr over 30 Minutes Intravenous Every 24 hours 07/10/24 2052     07/10/24 1545  cefTRIAXone  (ROCEPHIN ) 1 g in sodium chloride  0.9 % 100 mL IVPB        1 g 200 mL/hr over 30 Minutes Intravenous  Once 07/10/24 1537 07/10/24 1701        I have personally reviewed the following labs and images: CBC: Recent Labs  Lab 07/10/24 1420 07/10/24 1430 07/11/24 0452  WBC 9.2  --  8.8  NEUTROABS 6.0  --   --   HGB 11.9* 12.2 12.0  HCT 39.5 36.0 38.8  MCV 88.0  --  85.7  PLT 225  --  206   BMP &GFR Recent Labs  Lab 07/10/24 1420 07/10/24 1430 07/11/24 0452  NA 141 142 139  K 4.1 4.1 4.0  CL 106 104 106  CO2 25  --  21*  GLUCOSE 194* 187* 132*  BUN 15 16 18   CREATININE 0.83 0.90 0.98  CALCIUM  9.3  --  9.5   CrCl cannot be calculated (Unknown ideal weight.). Liver & Pancreas: Recent Labs  Lab 07/10/24 1420  AST 24  ALT 18  ALKPHOS 99  BILITOT 0.2  PROT 6.6  ALBUMIN 3.6   No results for input(s): LIPASE, AMYLASE in the last 168 hours. No results for input(s): AMMONIA in the last 168 hours. Diabetic: No results for input(s): HGBA1C in the last 72 hours.  Recent Labs  Lab 07/13/24 1208 07/13/24 1745 07/13/24 2146  07/14/24 0735 07/14/24 1144  GLUCAP 164* 152* 122* 96 154*   Cardiac Enzymes: No results for input(s): CKTOTAL, CKMB, CKMBINDEX, TROPONINI in the last 168 hours. No results for input(s): PROBNP in the last 8760 hours. Coagulation Profile: No results for input(s): INR, PROTIME in the last 168 hours. Thyroid  Function Tests: No results for input(s): TSH, T4TOTAL, FREET4, T3FREE, THYROIDAB in the last 72 hours. Lipid Profile: No results for input(s): CHOL, HDL, LDLCALC, TRIG, CHOLHDL, LDLDIRECT in the last 72 hours. Anemia Panel: No results for input(s): VITAMINB12, FOLATE, FERRITIN, TIBC, IRON, RETICCTPCT in the last 72 hours. Urine analysis:    Component Value Date/Time   COLORURINE YELLOW 07/10/2024 1337   APPEARANCEUR TURBID (A) 07/10/2024 1337   LABSPEC 1.011 07/10/2024 1337   PHURINE 5.0 07/10/2024 1337  GLUCOSEU NEGATIVE 07/10/2024 1337   GLUCOSEU >1000 mg/dL (A) 90/82/7978 8986   HGBUR MODERATE (A) 07/10/2024 1337   BILIRUBINUR NEGATIVE 07/10/2024 1337   KETONESUR NEGATIVE 07/10/2024 1337   PROTEINUR NEGATIVE 07/10/2024 1337   UROBILINOGEN 0.2 03/21/2020 1013   NITRITE POSITIVE (A) 07/10/2024 1337   LEUKOCYTESUR LARGE (A) 07/10/2024 1337   Sepsis Labs: Invalid input(s): PROCALCITONIN, LACTICIDVEN  Microbiology: Recent Results (from the past 240 hours)  Urine Culture (for pregnant, neutropenic or urologic patients or patients with an indwelling urinary catheter)     Status: Abnormal   Collection Time: 07/10/24  1:37 PM   Specimen: Urine, Clean Catch  Result Value Ref Range Status   Specimen Description   Final    URINE, CLEAN CATCH Performed at [facility], [address], [place], [place] [address]    Special Requests   Final    NONE Performed at [facility], [address] [place], [place] [address]    Culture >=100,000 COLONIES/mL CITROBACTER FARMERI (A)  Final    Report Status 07/12/2024 FINAL  Final   Organism ID, Bacteria CITROBACTER FARMERI (A)  Final      Susceptibility   Citrobacter farmeri - MIC*    CEFEPIME  <=0.12 SENSITIVE Sensitive     ERTAPENEM <=0.12 SENSITIVE Sensitive     CEFTRIAXONE  <=0.25 SENSITIVE Sensitive     CIPROFLOXACIN  <=0.06 SENSITIVE Sensitive     GENTAMICIN  <=1 SENSITIVE Sensitive     NITROFURANTOIN  32 SENSITIVE Sensitive     TRIMETH /SULFA  <=20 SENSITIVE Sensitive     PIP/TAZO Value in next row Sensitive      <=4 SENSITIVEThis is a modified FDA-approved test that has been validated and its performance characteristics determined by the reporting laboratory.  This laboratory is certified under the Clinical Laboratory Improvement Amendments CLIA as qualified to perform high complexity clinical laboratory testing.    MEROPENEM Value in next row Sensitive      <=4 SENSITIVEThis is a modified FDA-approved test that has been validated and its performance characteristics determined by the reporting laboratory.  This laboratory is certified under the Clinical Laboratory Improvement Amendments CLIA as qualified to perform high complexity clinical laboratory testing.    * >=100,000 COLONIES/mL CITROBACTER FARMERI  Blood culture (routine x 2)     Status: None (Preliminary result)   Collection Time: 07/10/24  2:20 PM   Specimen: BLOOD  Result Value Ref Range Status   Specimen Description   Final    BLOOD LEFT ANTECUBITAL Performed at [facility] [address], [place], [place] [address]    Special Requests   Final    BOTTLES DRAWN AEROBIC AND ANAEROBIC Blood Culture adequate volume Performed at [facility], [address], [place], [place] [address]    Culture   Final    NO GROWTH 4 DAYS Performed at [facility], [address], [place], [place] [address]    Report Status PENDING  Incomplete  Blood culture (routine x 2)     Status: None (Preliminary result)   Collection Time:  07/10/24  7:24 PM   Specimen: BLOOD LEFT HAND  Result Value Ref Range Status   Specimen Description   Final    BLOOD LEFT HAND Performed at [facility], [address], [place], [place] [address]    Special Requests   Final    BOTTLES DRAWN AEROBIC ONLY Blood Culture adequate volume Performed at [facility], [address] [place],  [place] [address]    Culture   Final    NO GROWTH 4 DAYS Performed at [facility], [address], [place], [place] [address]    Report Status PENDING  Incomplete    Radiology Studies: No results found.    Rajeev Escue T. Kort Stettler [facility]  If 7PM-7AM, please contact night-coverage [email or web address] 07/14/2024, 2:26 PM   "

## 2024-07-15 DIAGNOSIS — Z8673 Personal history of transient ischemic attack (TIA), and cerebral infarction without residual deficits: Secondary | ICD-10-CM | POA: Diagnosis not present

## 2024-07-15 DIAGNOSIS — N3000 Acute cystitis without hematuria: Secondary | ICD-10-CM | POA: Diagnosis not present

## 2024-07-15 DIAGNOSIS — G40909 Epilepsy, unspecified, not intractable, without status epilepticus: Secondary | ICD-10-CM | POA: Diagnosis not present

## 2024-07-15 DIAGNOSIS — I5032 Chronic diastolic (congestive) heart failure: Secondary | ICD-10-CM | POA: Diagnosis not present

## 2024-07-15 LAB — CULTURE, BLOOD (ROUTINE X 2)
Culture: NO GROWTH
Culture: NO GROWTH
Special Requests: ADEQUATE
Special Requests: ADEQUATE

## 2024-07-15 LAB — GLUCOSE, CAPILLARY
Glucose-Capillary: 116 mg/dL — ABNORMAL HIGH (ref 70–99)
Glucose-Capillary: 190 mg/dL — ABNORMAL HIGH (ref 70–99)
Glucose-Capillary: 130 mg/dL — ABNORMAL HIGH (ref 70–99)
Glucose-Capillary: 77 mg/dL (ref 70–99)

## 2024-07-15 NOTE — Plan of Care (Signed)

## 2024-07-15 NOTE — Progress Notes (Signed)
 " PROGRESS NOTE  [name] FMW:[id number] DOB: 11-07-1945   PCP: Norleen Lynwood ORN, MD  Patient is from: Home.  Bedbound at baseline.  Wheelchair dependent for mobility.  DOA: 07/10/2024 LOS: 5  Chief complaints Chief Complaint  Patient presents with   Urinary Frequency     Brief Narrative / Interim history: 79 y.o. female with medical history significant for CVA with residual right-sided hemiplegia, paroxysmal A-fib on Eliquis , CAD, T2DM, UTI, HTN, HLD, HFpEF, lymphedema and depression who presented to the ED for evaluation of urinary symptoms. Per son, has been having frequent urination and foul-smelling urine for the last 1 to 2 weeks. He is also started having some mood swings with occasional agitation. Reports patient has been completely bedbound for years due to her stroke he has had trouble caring for her recently and would like for her mother to be placed at long-term care.  Patient upon arrival to ED was hemodynamically stable.  UA was consistent with UTI.   Urine culture with pansensitive Citrobacter farmeri.  Completed 5 days of IV ceftriaxone  on 1/10.  Medically stable for discharge.  TOC and family working on LTC placement.  Subjective: Seen and examined earlier this morning.  No major events overnight of this morning.  No complaints but not a great historian.  Patient's son at bedside.   Assessment and plan: Acute cystitis/UTI: Urine cultures growing Citrobacter farmeri which is sensitive to Rocephin   - Received ceftriaxone  from 1/6-1/11   Chronic HFpEF: TTE in 04/2024 with LVEF of 55 to 60% and G1-DD.  Appears euvolemic on exam except for BLE edema/lymphedema.  On p.o. Lasix  as needed at home. -Continue holding Lasix .  Risk for dehydration and AKI. -Strict intake and output   Paroxysmal A-fib: Rate controlled. - Continue home Eliquis   NIDDM-2 with hyperglycemia and level 1 hypoglycemia: A1c 6.3% (was 8.7% about 5 months ago). Recent Labs  Lab 07/14/24 1144  07/14/24 1644 07/14/24 2134 07/15/24 0748 07/15/24 1138  GLUCAP 154* 138* 169* 116* 190*  -Continue Semglee  10 units at night. -Continue SSI-moderate with night coverage.  History of CVA with residual right-sided hemiaplasia and contractures in RUE. Generalized weakness-bedbound and wheelchair dependent at baseline. -Continue home Eliquis  and Lipitor -TOC and family working on [employer] placement  Essential hypertension? Normotensive for most part.  Not on meds at home. -Monitor    Hyperlipidemia - Continue atorvastatin    Seizure disorder: Stable - Continue Keppra    RLE pressure ulcer - Follows with wound care center once a week with last visit yesterday.   There is no height or weight on file to calculate BMI.           DVT prophylaxis:   apixaban  (ELIQUIS ) tablet 5 mg  Code Status: Full code Family Communication: Updated patient's son at bedside Level of care: Med-Surg Status is: Inpatient Remains inpatient appropriate because: UTI/placement   Final disposition: LTC   35 minutes with more than 50% spent in reviewing records, counseling patient/family and coordinating care.  Consultants:  None  Procedures: None  Microbiology summarized: Urine culture with pansensitive Citrobacter farmeri Blood cultures NGTD.  Objective: Vitals:   07/14/24 1440 07/14/24 2137 07/15/24 0755 07/15/24 1141  BP: 131/68 133/65 128/72 117/68  Pulse: 87 (!) 49 76 77  Resp: 16  12 16   Temp: 97.8 F (36.6 C) 98.1 F (36.7 C) (!) 97.3 F (36.3 C) 98.3 F (36.8 C)  TempSrc: Oral Oral Oral Oral  SpO2: 100% 96% 99% 99%    Examination:  GENERAL:  No apparent distress.  Nontoxic. HEENT: MMM.  Vision and hearing grossly intact.  NECK: Supple.  No apparent JVD.  RESP:  No IWOB.  Fair aeration bilaterally. CVS:  RRR. Heart sounds normal.  ABD/GI/GU: BS+. Abd soft, NTND.  MSK/EXT:  Moves extremities.  BLE weakness.  RUE paressis and contracture.  Lymphedema. NEURO: AA.   Oriented to self, person and place.  RUE paresis and contracture.  BLE weakness. PSYCH: Calm. Normal affect.   Sch Meds:  Scheduled Meds:  apixaban   5 mg Oral Q12H   atorvastatin   20 mg Oral Daily   insulin  aspart  0-15 Units Subcutaneous TID WC   insulin  aspart  0-5 Units Subcutaneous QHS   insulin  glargine  10 Units Subcutaneous QHS   levETIRAcetam   500 mg Oral Q12H   Continuous Infusions:  cefTRIAXone  (ROCEPHIN )  IV 1 g (07/15/24 1526)   PRN Meds:.acetaminophen  **OR** acetaminophen , bisacodyl , ondansetron  **OR** ondansetron  (ZOFRAN ) IV, mouth rinse, senna-docusate  Antimicrobials: Anti-infectives (From admission, onward)    Start     Dose/Rate Route Frequency Ordered Stop   07/11/24 1600  cefTRIAXone  (ROCEPHIN ) 1 g in sodium chloride  0.9 % 100 mL IVPB        1 g 200 mL/hr over 30 Minutes Intravenous Every 24 hours 07/10/24 2052     07/10/24 1545  cefTRIAXone  (ROCEPHIN ) 1 g in sodium chloride  0.9 % 100 mL IVPB        1 g 200 mL/hr over 30 Minutes Intravenous  Once 07/10/24 1537 07/10/24 1701        I have personally reviewed the following labs and images: CBC: Recent Labs  Lab 07/10/24 1420 07/10/24 1430 07/11/24 0452  WBC 9.2  --  8.8  NEUTROABS 6.0  --   --   HGB 11.9* 12.2 12.0  HCT 39.5 36.0 38.8  MCV 88.0  --  85.7  PLT 225  --  206   BMP &GFR Recent Labs  Lab 07/10/24 1420 07/10/24 1430 07/11/24 0452  NA 141 142 139  K 4.1 4.1 4.0  CL 106 104 106  CO2 25  --  21*  GLUCOSE 194* 187* 132*  BUN 15 16 18   CREATININE 0.83 0.90 0.98  CALCIUM  9.3  --  9.5   CrCl cannot be calculated (Unknown ideal weight.). Liver & Pancreas: Recent Labs  Lab 07/10/24 1420  AST 24  ALT 18  ALKPHOS 99  BILITOT 0.2  PROT 6.6  ALBUMIN 3.6   No results for input(s): LIPASE, AMYLASE in the last 168 hours. No results for input(s): AMMONIA in the last 168 hours. Diabetic: No results for input(s): HGBA1C in the last 72 hours.  Recent Labs  Lab  07/14/24 1144 07/14/24 1644 07/14/24 2134 07/15/24 0748 07/15/24 1138  GLUCAP 154* 138* 169* 116* 190*   Cardiac Enzymes: No results for input(s): CKTOTAL, CKMB, CKMBINDEX, TROPONINI in the last 168 hours. No results for input(s): PROBNP in the last 8760 hours. Coagulation Profile: No results for input(s): INR, PROTIME in the last 168 hours. Thyroid  Function Tests: No results for input(s): TSH, T4TOTAL, FREET4, T3FREE, THYROIDAB in the last 72 hours. Lipid Profile: No results for input(s): CHOL, HDL, LDLCALC, TRIG, CHOLHDL, LDLDIRECT in the last 72 hours. Anemia Panel: No results for input(s): VITAMINB12, FOLATE, FERRITIN, TIBC, IRON, RETICCTPCT in the last 72 hours. Urine analysis:    Component Value Date/Time   COLORURINE YELLOW 07/10/2024 1337   APPEARANCEUR TURBID (A) 07/10/2024 1337   LABSPEC 1.011 07/10/2024 1337   PHURINE 5.0  07/10/2024 1337   GLUCOSEU NEGATIVE 07/10/2024 1337   GLUCOSEU >1000 mg/dL (A) 90/82/7978 8986   HGBUR MODERATE (A) 07/10/2024 1337   BILIRUBINUR NEGATIVE 07/10/2024 1337   KETONESUR NEGATIVE 07/10/2024 1337   PROTEINUR NEGATIVE 07/10/2024 1337   UROBILINOGEN 0.2 03/21/2020 1013   NITRITE POSITIVE (A) 07/10/2024 1337   LEUKOCYTESUR LARGE (A) 07/10/2024 1337   Sepsis Labs: Invalid input(s): PROCALCITONIN, LACTICIDVEN  Microbiology: Recent Results (from the past 240 hours)  Urine Culture (for pregnant, neutropenic or urologic patients or patients with an indwelling urinary catheter)     Status: Abnormal   Collection Time: 07/10/24  1:37 PM   Specimen: Urine, Clean Catch  Result Value Ref Range Status   Specimen Description   Final    URINE, CLEAN CATCH Performed at [facility], [address] [place], [place] [address]    Special Requests   Final    NONE Performed at [facility], [address] [place], [place] [address]    Culture >=100,000  COLONIES/mL CITROBACTER FARMERI (A)  Final   Report Status 07/12/2024 FINAL  Final   Organism ID, Bacteria CITROBACTER FARMERI (A)  Final      Susceptibility   Citrobacter farmeri - MIC*    CEFEPIME  <=0.12 SENSITIVE Sensitive     ERTAPENEM <=0.12 SENSITIVE Sensitive     CEFTRIAXONE  <=0.25 SENSITIVE Sensitive     CIPROFLOXACIN  <=0.06 SENSITIVE Sensitive     GENTAMICIN  <=1 SENSITIVE Sensitive     NITROFURANTOIN  32 SENSITIVE Sensitive     TRIMETH /SULFA  <=20 SENSITIVE Sensitive     PIP/TAZO Value in next row Sensitive      <=4 SENSITIVEThis is a modified FDA-approved test that has been validated and its performance characteristics determined by the reporting laboratory.  This laboratory is certified under the Clinical Laboratory Improvement Amendments CLIA as qualified to perform high complexity clinical laboratory testing.    MEROPENEM Value in next row Sensitive      <=4 SENSITIVEThis is a modified FDA-approved test that has been validated and its performance characteristics determined by the reporting laboratory.  This laboratory is certified under the Clinical Laboratory Improvement Amendments CLIA as qualified to perform high complexity clinical laboratory testing.    * >=100,000 COLONIES/mL CITROBACTER FARMERI  Blood culture (routine x 2)     Status: None   Collection Time: 07/10/24  2:20 PM   Specimen: BLOOD  Result Value Ref Range Status   Specimen Description   Final    BLOOD LEFT ANTECUBITAL Performed at [facility], [address], [place], [place] [address]    Special Requests   Final    BOTTLES DRAWN AEROBIC AND ANAEROBIC Blood Culture adequate volume Performed at [facility], [address] [place], [place] [address]    Culture   Final    NO GROWTH 5 DAYS Performed at [facility], [address] [place], [place] [address]    Report Status 07/15/2024 FINAL  Final  Blood culture (routine x 2)     Status: None   Collection  Time: 07/10/24  7:24 PM   Specimen: BLOOD LEFT HAND  Result Value Ref Range Status   Specimen Description   Final    BLOOD LEFT HAND Performed at [facility], [address], [place], [place] [address]    Special Requests   Final    BOTTLES DRAWN AEROBIC ONLY Blood Culture adequate volume Performed at [facility], [address]  [place], [place] [address]    Culture   Final    NO GROWTH 5 DAYS Performed at [facility], [address], [place], [place] [address]    Report Status 07/15/2024 FINAL  Final    Radiology Studies: No results found.    Tatyana Biber T. Taylon Louison [facility]  If 7PM-7AM, please contact night-coverage [email or web address] 07/15/2024, 3:37 PM   "

## 2024-07-16 ENCOUNTER — Other Ambulatory Visit: Payer: Self-pay

## 2024-07-16 ENCOUNTER — Other Ambulatory Visit (HOSPITAL_COMMUNITY): Payer: Self-pay

## 2024-07-16 DIAGNOSIS — G40909 Epilepsy, unspecified, not intractable, without status epilepticus: Secondary | ICD-10-CM | POA: Diagnosis not present

## 2024-07-16 DIAGNOSIS — R531 Weakness: Secondary | ICD-10-CM | POA: Diagnosis not present

## 2024-07-16 DIAGNOSIS — I69351 Hemiplegia and hemiparesis following cerebral infarction affecting right dominant side: Secondary | ICD-10-CM

## 2024-07-16 DIAGNOSIS — I5032 Chronic diastolic (congestive) heart failure: Secondary | ICD-10-CM | POA: Diagnosis not present

## 2024-07-16 DIAGNOSIS — I480 Paroxysmal atrial fibrillation: Secondary | ICD-10-CM | POA: Diagnosis not present

## 2024-07-16 DIAGNOSIS — N3000 Acute cystitis without hematuria: Secondary | ICD-10-CM | POA: Diagnosis not present

## 2024-07-16 LAB — GLUCOSE, CAPILLARY
Glucose-Capillary: 108 mg/dL — ABNORMAL HIGH (ref 70–99)
Glucose-Capillary: 118 mg/dL — ABNORMAL HIGH (ref 70–99)

## 2024-07-16 MED ORDER — PEN NEEDLES 30G X 5 MM MISC: 1.0000 | Freq: Every day | 1 refills | Status: DC

## 2024-07-16 MED ORDER — INSULIN PEN NEEDLE 31G X 5 MM MISC
1.0000 | Freq: Every day | 1 refills | Status: AC
  Filled 2024-07-16: qty 100, 30d supply, fill #0
  Filled 2024-07-16: qty 100, fill #0

## 2024-07-16 MED ORDER — TRESIBA FLEXTOUCH 100 UNIT/ML ~~LOC~~ SOPN: 15.0000 [IU] | PEN_INJECTOR | Freq: Every day | SUBCUTANEOUS | 0 refills | Status: DC

## 2024-07-16 MED ORDER — TRESIBA FLEXTOUCH 100 UNIT/ML ~~LOC~~ SOPN
15.0000 [IU] | PEN_INJECTOR | Freq: Every day | SUBCUTANEOUS | 0 refills | Status: AC
  Filled 2024-07-16 (×2): qty 15, 100d supply, fill #0

## 2024-07-16 NOTE — Discharge Summary (Addendum)
 "  Physician Discharge Summary  [name] FMW:[id number] DOB: [birth year] DOA: 07/10/2024  PCP: Norleen Lynwood ORN, MD  Admit date: 07/10/2024 Discharge date: 07/16/2024  Admitted From: Home Disposition: Home Recommendations for Outpatient Follow-up:  Check CMP and CBC in 1 week Please follow up on the following pending results: None  Discharge Condition: Stable CODE STATUS: Full code  Home health: PT/OT/RN/aide DME: Patient has appropriate DME   Follow-up Information     Norleen Lynwood ORN, MD. Schedule an appointment as soon as possible for a visit in 1 week(s).   Specialties: Internal Medicine, Radiology Contact information: [address] [place] [address] [phone]                 Hospital course 79 y.o. female with medical history significant for CVA with residual right-sided hemiplegia and RUE contracture, paroxysmal A-fib on Eliquis , CAD, T2DM, UTI, HTN, HLD, HFpEF, lymphedema and depression who presented to the ED for evaluation of urinary symptoms. Per son, has been having frequent urination and foul-smelling urine for the last 1 to 2 weeks. He is also started having some mood swings with occasional agitation. Reports patient has been completely bedbound for years due to her stroke he has had trouble caring for her recently and would like for her mother to be placed at long-term care.  Patient upon arrival to ED was hemodynamically stable.  UA was consistent with UTI.    Urine culture with pansensitive Citrobacter farmeri.  Completed 6 days of IV ceftriaxone  on 1/11.   Unfortunately, family and TOC not able to secure LTAC placement.  Patient is discharged home with home health.  She has appropriate DME's.  See individual problem list below for more.   Problems addressed during this hospitalization Acute cystitis/UTI: Urine cultures growing Citrobacter farmeri which is sensitive to Rocephin   - Received ceftriaxone  from 1/6-1/11   Chronic HFpEF: TTE in  04/2024 with LVEF of 55 to 60% and G1-DD.  Appears euvolemic on exam except for BLE edema/lymphedema.  On p.o. Lasix  as needed at home. -Discontinue Lasix .  Risk for dehydration and AKI.   Paroxysmal A-fib: Rate controlled without meds. - Continue home Eliquis    NIDDM-2 with hyperglycemia and level 1 hypoglycemia: A1c 6.3% (was 8.7% about 5 months ago). -Increase on Tresiba  from 10 to 15 units daily.   History of CVA with residual right-sided hemiaplasia and contractures in RUE. Generalized weakness-bedbound and wheelchair dependent at baseline. -Continue home Eliquis  and Lipitor -TOC and family working on [employer] placement   Essential hypertension? Normotensive for most part.  Not on meds at home.   Hyperlipidemia - Continue atorvastatin    Seizure disorder: Stable - Continue Keppra    RLE pressure ulcer - Follows with wound care center once a week with last visit yesterday.  There is no height or weight on file to calculate BMI.           Consultations: None  Time spent 35  minutes  Vital signs Vitals:   07/15/24 1141 07/15/24 2010 07/15/24 2010 07/16/24 0442  BP: 117/68 125/72 125/72 127/88  Pulse: 77 78 77 85  Temp: 98.3 F (36.8 C) 98.2 F (36.8 C) 98.2 F (36.8 C) 98 F (36.7 C)  Resp: 16   18  SpO2: 99% 96% 97% 97%  TempSrc: Oral Oral Oral Oral     Discharge exam GENERAL: No apparent distress.  Nontoxic. HEENT: MMM.  Vision and hearing grossly intact.  NECK: Supple.  No apparent JVD.  RESP:  No  IWOB.  Fair aeration bilaterally. CVS:  RRR. Heart sounds normal.  ABD/GI/GU: BS+. Abd soft, NTND.  MSK/EXT:  Moves extremities.  BLE weakness.  RUE paressis and contracture.  Lymphedema. NEURO: AA.  Oriented to self, person and place.  RUE paresis and contracture.  BLE weakness. PSYCH: Calm. Normal affect.   Discharge Instructions Discharge Instructions     Increase activity slowly   Complete by: As directed       Allergies as of 07/16/2024        Reactions   Porcine (pork) Protein-containing Drug Products Other (See Comments)   Does NOT wish to receive this        Medication List     STOP taking these medications    furosemide  20 MG tablet Commonly known as: LASIX    gentamicin  cream 0.1 % Commonly known as: GARAMYCIN    midodrine  2.5 MG tablet Commonly known as: PROAMATINE        TAKE these medications    acetaminophen  325 MG tablet Commonly known as: TYLENOL  Take 2 tablets (650 mg total) by mouth every 6 (six) hours as needed. What changed: reasons to take this   ammonium lactate  12 % lotion Commonly known as: LAC-HYDRIN  Apply topically 2 (two) times daily. Apply to legs What changed:  how much to take when to take this reasons to take this additional instructions   atorvastatin  20 MG tablet Commonly known as: LIPITOR Take 1 tablet (20 mg total) by mouth daily.   bisacodyl  10 MG suppository Commonly known as: Dulcolax Place 1 suppository (10 mg total) rectally as needed for moderate constipation. What changed: Another medication with the same name was removed. Continue taking this medication, and follow the directions you see here.   diclofenac  Sodium 1 % Gel Commonly known as: VOLTAREN  Apply 4 g topically 4 (four) times daily. What changed:  when to take this reasons to take this   docusate sodium  100 MG capsule Commonly known as: Colace Take 1 capsule (100 mg total) by mouth 2 (two) times daily. What changed:  when to take this reasons to take this   Eliquis  5 MG Tabs tablet Generic drug: apixaban  Take 1 tablet (5 mg total) by mouth every 12 (twelve) hours.   levETIRAcetam  500 MG tablet Commonly known as: KEPPRA  Take 1 tablet (500 mg total) by mouth every 12 (twelve) hours.   polyethylene glycol 17 g packet Commonly known as: MIRALAX  / GLYCOLAX  Take 17 g by mouth daily. What changed:  when to take this reasons to take this   Tresiba  FlexTouch 100 UNIT/ML FlexTouch Pen Generic  drug: insulin  degludec Inject 15 Units into the skin at bedtime. What changed: how much to take         Procedures/Studies:   CT Head Wo Contrast Result Date: 07/10/2024 CLINICAL DATA:  Mood swings and headache. EXAM: CT HEAD WITHOUT CONTRAST TECHNIQUE: Contiguous axial images were obtained from the base of the skull through the vertex without intravenous contrast. RADIATION DOSE REDUCTION: This exam was performed according to the departmental dose-optimization program which includes automated exposure control, adjustment of the mA and/or kV according to patient size and/or use of iterative reconstruction technique. COMPARISON:  December 03, 2017 FINDINGS: Brain: There is generalized cerebral atrophy with widening of the extra-axial spaces and ventricular dilatation. There are areas of decreased attenuation within the white matter tracts of the supratentorial brain, consistent with microvascular disease changes. A chronic left frontal lobe white matter infarct is seen with ex vacuo dilatation of the left  lateral ventricle. Vascular: Moderate severity bilateral cavernous carotid artery calcification is noted. Skull: Normal. Negative for fracture or focal lesion. Sinuses/Orbits: No acute finding. Other: None. IMPRESSION: 1. Generalized cerebral atrophy with chronic white matter small vessel ischemic changes. 2. Chronic left frontal lobe white matter infarct. 3. No acute intracranial abnormality. Electronically Signed   By: Suzen Dials M.D.   On: 07/10/2024 13:20   DG Chest 2 View Result Date: 07/10/2024 EXAM: 2 VIEW(S) XRAY OF THE CHEST 07/10/2024 12:36:00 PM COMPARISON: 05/12/2024 CLINICAL HISTORY: agitaiton FINDINGS: LUNGS AND PLEURA: Low lung volumes. Patchy airspace opacities at left lung base. No pleural effusion. No pneumothorax. HEART AND MEDIASTINUM: Aortic arch atherosclerosis. No acute abnormality of the cardiac and mediastinal silhouettes. BONES AND SOFT TISSUES: Hiatal hernia. No acute  osseous abnormality. Osteopenia. Multilevel degenerative changes of the spine. f IMPRESSION: 1. Low lung volumes with patchy airspace opacities at the left lung base, which may represent atelectasis or a developing bronchopneumonia, in the correct clinical context Electronically signed by: Rogelia Myers MD 07/10/2024 01:04 PM EST RP Workstation: [id number]       The results of significant diagnostics from this hospitalization (including imaging, microbiology, ancillary and laboratory) are listed below for reference.     Microbiology: Recent Results (from the past 240 hours)  Urine Culture (for pregnant, neutropenic or urologic patients or patients with an indwelling urinary catheter)     Status: Abnormal   Collection Time: 07/10/24  1:37 PM   Specimen: Urine, Clean Catch  Result Value Ref Range Status   Specimen Description   Final    URINE, CLEAN CATCH Performed at [facility], [address] [place], [place] [address]    Special Requests   Final    NONE Performed at [facility], [address], [place], [place] [address]    Culture >=100,000 COLONIES/mL CITROBACTER FARMERI (A)  Final   Report Status 07/12/2024 FINAL  Final   Organism ID, Bacteria CITROBACTER FARMERI (A)  Final      Susceptibility   Citrobacter farmeri - MIC*    CEFEPIME  <=0.12 SENSITIVE Sensitive     ERTAPENEM <=0.12 SENSITIVE Sensitive     CEFTRIAXONE  <=0.25 SENSITIVE Sensitive     CIPROFLOXACIN  <=0.06 SENSITIVE Sensitive     GENTAMICIN  <=1 SENSITIVE Sensitive     NITROFURANTOIN  32 SENSITIVE Sensitive     TRIMETH /SULFA  <=20 SENSITIVE Sensitive     PIP/TAZO Value in next row Sensitive      <=4 SENSITIVEThis is a modified FDA-approved test that has been validated and its performance characteristics determined by the reporting laboratory.  This laboratory is certified under the Clinical Laboratory Improvement Amendments CLIA as qualified to perform high complexity clinical  laboratory testing.    MEROPENEM Value in next row Sensitive      <=4 SENSITIVEThis is a modified FDA-approved test that has been validated and its performance characteristics determined by the reporting laboratory.  This laboratory is certified under the Clinical Laboratory Improvement Amendments CLIA as qualified to perform high complexity clinical laboratory testing.    * >=100,000 COLONIES/mL CITROBACTER FARMERI  Blood culture (routine x 2)     Status: None   Collection Time: 07/10/24  2:20 PM   Specimen: BLOOD  Result Value Ref Range Status   Specimen Description   Final    BLOOD LEFT ANTECUBITAL Performed at [facility], [address] [place], [place] [address]    Special Requests   Final    BOTTLES DRAWN AEROBIC AND  ANAEROBIC Blood Culture adequate volume Performed at [facility], [address], [place], [place] [address]    Culture   Final    NO GROWTH 5 DAYS Performed at [facility], [address], [place], [place] [address]    Report Status 07/15/2024 FINAL  Final  Blood culture (routine x 2)     Status: None   Collection Time: 07/10/24  7:24 PM   Specimen: BLOOD LEFT HAND  Result Value Ref Range Status   Specimen Description   Final    BLOOD LEFT HAND Performed at [facility], [address], [place], [place] [address]    Special Requests   Final    BOTTLES DRAWN AEROBIC ONLY Blood Culture adequate volume Performed at [facility], [address] [place], [place] [address]    Culture   Final    NO GROWTH 5 DAYS Performed at [facility], [address] [place], [place] [address]    Report Status 07/15/2024 FINAL  Final     Labs:  CBC: Recent Labs  Lab 07/10/24 1420 07/10/24 1430 07/11/24 0452  WBC 9.2  --  8.8  NEUTROABS 6.0  --   --   HGB 11.9* 12.2 12.0  HCT 39.5 36.0 38.8  MCV 88.0  --  85.7  PLT 225  --  206   BMP &GFR Recent Labs  Lab  07/10/24 1420 07/10/24 1430 07/11/24 0452  NA 141 142 139  K 4.1 4.1 4.0  CL 106 104 106  CO2 25  --  21*  GLUCOSE 194* 187* 132*  BUN 15 16 18   CREATININE 0.83 0.90 0.98  CALCIUM  9.3  --  9.5   CrCl cannot be calculated (Unknown ideal weight.). Liver & Pancreas: Recent Labs  Lab 07/10/24 1420  AST 24  ALT 18  ALKPHOS 99  BILITOT 0.2  PROT 6.6  ALBUMIN 3.6   No results for input(s): LIPASE, AMYLASE in the last 168 hours. No results for input(s): AMMONIA in the last 168 hours. Diabetic: No results for input(s): HGBA1C in the last 72 hours. Recent Labs  Lab 07/15/24 0748 07/15/24 1138 07/15/24 1719 07/15/24 2214 07/16/24 0735  GLUCAP 116* 190* 130* 77 108*   Cardiac Enzymes: No results for input(s): CKTOTAL, CKMB, CKMBINDEX, TROPONINI in the last 168 hours. No results for input(s): PROBNP in the last 8760 hours. Coagulation Profile: No results for input(s): INR, PROTIME in the last 168 hours. Thyroid  Function Tests: No results for input(s): TSH, T4TOTAL, FREET4, T3FREE, THYROIDAB in the last 72 hours. Lipid Profile: No results for input(s): CHOL, HDL, LDLCALC, TRIG, CHOLHDL, LDLDIRECT in the last 72 hours. Anemia Panel: No results for input(s): VITAMINB12, FOLATE, FERRITIN, TIBC, IRON, RETICCTPCT in the last 72 hours. Urine analysis:    Component Value Date/Time   COLORURINE YELLOW 07/10/2024 1337   APPEARANCEUR TURBID (A) 07/10/2024 1337   LABSPEC 1.011 07/10/2024 1337   PHURINE 5.0 07/10/2024 1337   GLUCOSEU NEGATIVE 07/10/2024 1337   GLUCOSEU >1000 mg/dL (A) 90/82/7978 8986   HGBUR MODERATE (A) 07/10/2024 1337   BILIRUBINUR NEGATIVE 07/10/2024 1337   KETONESUR NEGATIVE 07/10/2024 1337   PROTEINUR NEGATIVE 07/10/2024 1337   UROBILINOGEN 0.2 03/21/2020 1013   NITRITE POSITIVE (A) 07/10/2024 1337   LEUKOCYTESUR LARGE (A) 07/10/2024 1337   Sepsis Labs: Invalid input(s): PROCALCITONIN,  LACTICIDVEN   SIGNED:  Gillis Boardley T Alveda Vanhorne, MD  [facility] 07/16/2024, 10:08 AM   "

## 2024-07-16 NOTE — Care Management Important Message (Signed)
 Important Message  Patient Details IM Letter given Name: [name] MRN: [id number] Date of Birth: Feb 14, 1946   Important Message Given:  Yes - Medicare IM     Terelle Dobler 07/16/2024, 9:54 AM

## 2024-07-16 NOTE — Progress Notes (Signed)
 First set of discharge meds in a secure bag  delivered to patient in room -placed on the window seat- waiting on tresiba  to be filled

## 2024-07-16 NOTE — Care Management Obs Status (Signed)
 MEDICARE OBSERVATION STATUS NOTIFICATION   Patient Details  Name: [name] MRN: [id number] Date of Birth: 08/17/1945   Medicare Observation Status Notification Given:  Yes    Doneta Glenys DASEN, RN 07/16/2024, 11:47 AM

## 2024-07-16 NOTE — Progress Notes (Signed)
 AVS reviewed with patient and son. [name], patient's son requests additional tresiba  and pen needle due to dose increase- request sent to Dr Kathrin & pahramcy via secure chat

## 2024-07-16 NOTE — Progress Notes (Signed)
 Patient discharged at 1452, call to [name] Portugal (POA) at 1457. Voice mail left to let him know Tresiba  and pen needles were ready for pick. At discharge, plan per primary nurse was  for [facility] to pick up insulin  on 07/17/24.  This RN left her work cell [phone]) for call back in case patient or [name] had questions.

## 2024-07-16 NOTE — TOC Progression Note (Addendum)
 Transition of Care [facility]) - Progression Note    Patient Details  Name: [name] MRN: [id number] Date of Birth: [birth year]  [facility]) CM/SW Contact  Doneta Glenys DASEN, RN Phone Number: 07/16/2024, 9:37 AM  Clinical Narrative:    CM called [name] Portugal (son) [phone] to discuss LTC placement - [name] has not had been able to find LTC.CM informed [name] that if no bed offers he will need to take his mother home today.   Expected Discharge Plan: Home w Home Health Services Barriers to Discharge: Continued Medical Work up               Expected Discharge Plan and Services In-house Referral: NA Discharge Planning Services: CM Consult Post Acute Care Choice: NA Living arrangements for the past 2 months: Single Family Home                 DME Arranged: N/A DME Agency: NA       HH Arranged: NA HH Agency: NA         Social Drivers of Health (SDOH) Interventions SDOH Screenings   Food Insecurity: No Food Insecurity (05/01/2024)  Housing: Low Risk (05/01/2024)  Transportation Needs: No Transportation Needs (05/01/2024)  Utilities: Not At Risk (05/01/2024)  Depression (PHQ2-9): Low Risk (04/23/2024)  Social Connections: Socially Integrated (05/01/2024)  Tobacco Use: Medium Risk (05/12/2024)    Readmission Risk Interventions    07/12/2024   11:31 AM 05/02/2024   10:07 AM  Readmission Risk Prevention Plan  Transportation Screening Complete Complete  PCP or Specialist Appt within 5-7 Days Complete Complete  Home Care Screening Complete Complete  Medication Review (RN CM) Complete Referral to Pharmacy

## 2024-07-16 NOTE — Care Management CC44 (Signed)
"         Condition Code 44 Documentation Completed  Patient Details  Name: [name] MRN: [id number] Date of Birth: 09-Oct-1945   Condition Code 44 given:  Yes Patient signature on Condition Code 44 notice:  Yes Documentation of 2 MD's agreement:  Yes Code 44 added to claim:  Yes    Doneta Glenys DASEN, RN 07/16/2024, 11:47 AM  "

## 2024-07-16 NOTE — TOC Transition Note (Signed)
 Transition of Care [facility]) - Discharge Note   Patient Details  Name: [name] MRN: [id number] Date of Birth: 02-28-1946  Transition of Care [facility]) CM/SW Contact:  Doneta Glenys DASEN, RN Phone Number: 07/16/2024, 3:27 PM   Clinical Narrative:    Patients son [name] was not able to secure a LTC location for patient. Patient discharged home with son.   Final next level of care: Home/Self Care Barriers to Discharge: Barriers Resolved   Patient Goals and CMS Choice Patient states their goals for this hospitalization and ongoing recovery are:: Home with son CMS Medicare.gov Compare Post Acute Care list provided to:: Patient Choice offered to / list presented to : Patient [place] ownership interest in [facility].provided to:: Patient    Discharge Placement                       Discharge Plan and Services Additional resources added to the After Visit Summary for   In-house Referral: NA Discharge Planning Services: CM Consult Post Acute Care Choice: NA          DME Arranged: N/A DME Agency: NA       HH Arranged: NA HH Agency: NA        Social Drivers of Health (SDOH) Interventions SDOH Screenings   Food Insecurity: No Food Insecurity (05/01/2024)  Housing: Low Risk (05/01/2024)  Transportation Needs: No Transportation Needs (05/01/2024)  Utilities: Not At Risk (05/01/2024)  Depression (PHQ2-9): Low Risk (04/23/2024)  Social Connections: Socially Integrated (05/01/2024)  Tobacco Use: Medium Risk (05/12/2024)     Readmission Risk Interventions    07/12/2024   11:31 AM 05/02/2024   10:07 AM  Readmission Risk Prevention Plan  Transportation Screening Complete Complete  PCP or Specialist Appt within 5-7 Days Complete Complete  Home Care Screening Complete Complete  Medication Review (RN CM) Complete Referral to Pharmacy

## 2024-07-16 NOTE — Progress Notes (Signed)
 Patient's son here, states private transport will be here at 1500 to pick up patient. Discharge meds will be pulled over to [facility] and delivered prior to patient discharging to home. Patient will need to be in her w/c prior to transport.

## 2024-07-17 ENCOUNTER — Encounter (HOSPITAL_BASED_OUTPATIENT_CLINIC_OR_DEPARTMENT_OTHER): Admitting: Internal Medicine

## 2024-07-17 ENCOUNTER — Telehealth: Payer: Self-pay

## 2024-07-17 ENCOUNTER — Other Ambulatory Visit (HOSPITAL_COMMUNITY): Payer: Self-pay

## 2024-07-17 NOTE — Transitions of Care (Post Inpatient/ED Visit) (Signed)
 "  07/17/2024  Name: [name] MRN: [id number] DOB: 1945-09-04  Today's TOC FU Call Status: Today's TOC FU Call Status:: Successful TOC FU Call Completed TOC FU Call Complete Date: 07/17/24  Patient's Name and Date of Birth confirmed. Name, DOB  Transition Care Management Follow-up Telephone Call Date of Discharge: 07/16/24 Discharge Facility: [facility]) Type of Discharge: Inpatient Admission Primary Inpatient Discharge Diagnosis:: cystitis How have you been since you were released from the hospital?: Better Any questions or concerns?: No  Items Reviewed: Did you receive and understand the discharge instructions provided?: Yes Medications obtained,verified, and reconciled?: Yes (Medications Reviewed) Any new allergies since your discharge?: No Dietary orders reviewed?: Yes Do you have support at home?: Yes People in Home [RPT]: child(ren), adult  Medications Reviewed Today: Medications Reviewed Today     Reviewed by Emmitt Pan, LPN (Licensed Practical Nurse) on 07/17/24 at 1541  Med List Status: <None>   Medication Order Taking? Sig Documenting Provider Last Dose Status Informant  acetaminophen  (TYLENOL ) 325 MG tablet [id number]  Take 2 tablets (650 mg total) by mouth every 6 (six) hours as needed.  Patient not taking: Reported on 07/17/2024   Dean Clarity, MD  Active Family Member  ammonium lactate  (LAC-HYDRIN ) 12 % lotion [id number] Yes Apply topically 2 (two) times daily. Apply to legs  Patient taking differently: Apply 1 Application topically 2 (two) times daily as needed for dry skin (legs).   Tobie Yetta HERO, MD  Active Family Member  atorvastatin  (LIPITOR) 20 MG tablet [id number] Yes Take 1 tablet (20 mg total) by mouth daily. Norleen Lynwood ORN, MD  Active Family Member  bisacodyl  (DULCOLAX) 10 MG suppository [id number]  Place 1 suppository (10 mg total) rectally as needed for moderate constipation.  Patient not taking: Reported on 07/17/2024   Tobie Yetta HERO, MD  Active Family Member  diclofenac  Sodium (VOLTAREN ) 1 % GEL [id number] Yes Apply 4 g topically 4 (four) times daily.  Patient taking differently: Apply 4 g topically 4 (four) times daily as needed (for pain).   Dean Clarity, MD  Active Family Member  docusate sodium  (COLACE) 100 MG capsule [id number] Yes Take 1 capsule (100 mg total) by mouth 2 (two) times daily.  Patient taking differently: Take 100 mg by mouth 2 (two) times daily as needed for mild constipation.   Tobie Yetta HERO, MD  Active Family Member  ELIQUIS  5 MG TABS tablet [id number] Yes Take 1 tablet (5 mg total) by mouth every 12 (twelve) hours. Norleen Lynwood ORN, MD  Active Family Member  Insulin  Pen Needle 31G X 5 MM MISC [id number] Yes Use to inject insulin  Gonfa, Taye T, MD  Active   levETIRAcetam  (KEPPRA ) 500 MG tablet [id number] Yes Take 1 tablet (500 mg total) by mouth every 12 (twelve) hours. Norleen Lynwood ORN, MD  Active Family Member  polyethylene glycol (MIRALAX  / GLYCOLAX ) 17 g packet [id number] Yes Take 17 g by mouth daily.  Patient taking differently: Take 17 g by mouth daily as needed for mild constipation.   Tobie Yetta HERO, MD  Active Family Member  TRESIBA  FLEXTOUCH 100 UNIT/ML FlexTouch Pen [id number] Yes Inject 15 Units into the skin at bedtime. Gonfa, Taye T, MD  Active             Home Care and Equipment/Supplies: Were Home Health Services Ordered?: NA Any new equipment or medical supplies ordered?: NA  Functional Questionnaire: Do you need assistance with bathing/showering or dressing?: No Do you need assistance with meal  preparation?: No Do you need assistance with eating?: No Do you have difficulty maintaining continence: No Do you need assistance with getting out of bed/getting out of a chair/moving?: No Do you have difficulty managing or taking your medications?: No  Follow up appointments reviewed: PCP Follow-up appointment confirmed?: No (declined) MD Provider Line [phone] Given:  No Specialist Hospital Follow-up appointment confirmed?: NA Do you need transportation to your follow-up appointment?: No Do you understand care options if your condition(s) worsen?: Yes-patient verbalized understanding    SIGNATURE Julian Lemmings, LPN [facility] Nurse Health Advisor Direct Dial [phone]  "

## 2024-07-25 ENCOUNTER — Encounter (HOSPITAL_BASED_OUTPATIENT_CLINIC_OR_DEPARTMENT_OTHER): Admitting: Internal Medicine

## 2024-07-26 ENCOUNTER — Encounter (HOSPITAL_BASED_OUTPATIENT_CLINIC_OR_DEPARTMENT_OTHER): Admitting: Internal Medicine

## 2024-08-02 ENCOUNTER — Encounter (HOSPITAL_BASED_OUTPATIENT_CLINIC_OR_DEPARTMENT_OTHER): Admitting: Internal Medicine

## 2024-08-02 DIAGNOSIS — S81801A Unspecified open wound, right lower leg, initial encounter: Secondary | ICD-10-CM | POA: Diagnosis not present

## 2024-08-09 ENCOUNTER — Telehealth: Payer: Self-pay

## 2024-08-09 NOTE — Telephone Encounter (Signed)
Will be on the lookout for the forms.

## 2024-08-09 NOTE — Telephone Encounter (Signed)
 Copied from CRM [id number]. Topic: General - Other >> Aug 09, 2024 10:05 AM Pinkey ORN wrote: Reason for CRM: Incontinent Supplies >> Aug 09, 2024 10:10 AM Pinkey ORN wrote: [name] son called on behalf of his mother. States they have requested some incontinence supplies for the patient through Medicare, but they're needing for a physician to approve / sign off on the order in order for the insurance to ship the supplies out to the patient.   [name] states that the documents were already sent over by [facility], please provide clinical notes as well.

## 2024-08-16 ENCOUNTER — Encounter (HOSPITAL_BASED_OUTPATIENT_CLINIC_OR_DEPARTMENT_OTHER): Admitting: Internal Medicine

## 2024-10-17 ENCOUNTER — Encounter: Admitting: Family Medicine

## 2024-10-22 ENCOUNTER — Ambulatory Visit: Admitting: Internal Medicine
